# Patient Record
Sex: Female | Born: 1955 | Race: White | Hispanic: No | Marital: Married
Health system: Midwestern US, Academic
[De-identification: ages and names within clinical notes are randomized; demographics above are authoritative.]

---

## 2017-02-14 ENCOUNTER — Encounter: Admit: 2017-02-14 | Discharge: 2017-02-15

## 2017-02-14 DIAGNOSIS — R69 Illness, unspecified: Principal | ICD-10-CM

## 2017-02-28 ENCOUNTER — Encounter: Admit: 2017-02-28 | Discharge: 2017-02-28 | Payer: BC Managed Care – PPO

## 2017-02-28 NOTE — Telephone Encounter
Navigation Intake Assessment    Patient Name:  Vicki Buchanan  DOB: 11/17/1955  Insurance:  [employer]  Direct Referral: Yes  Appointment Info:  Scheduled 03/05/2017 @ 1:15 with Dr. 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      Physician Info:  ? Referring Physician:  Dr. Jonah Blue  ??? Contact Name & Number:  Brayton Caves @ [phone]  ??? Surgeon:  Dr. Jonah Blue  ??? PCP:  Dr. Fredia Sorrow    Location of Films:   [facility]    Location of Pathology:  Requested 02/28/2017 from [facility], and Patient notified outside pathology slides will be obtained for review by [facility] pathologist and a facility and professional fee will be billed to their insurance.      History of Present Illness: Patient seen by PCP on 02/14/2017 with c/o intermittent, recurrent abdominal cramping, bloating and diarrhea. Patient reports 6-7 months ago she began having episodes of abdominal cramping that progressed in severity until it caused her to vomit multiple times until it was dry heaving. This was followed by diarrhea the next morning. Patient continues to have abdominal cramping and bloating with alternating watery diarrhea and constipation (this can change day to day). Upon examination was noted decreased bowel sounds, mass (multiple firm lesions noted on deep palpation to RLQ, mid upper quadrant, and LLQ, non tender to palpation). She is tender with firm mass noted RUQ, LUQ and LLQ. A CT scan was done on 02/14/2017 that demonstrated large volume of abdominopelvic ascites, omental caking, evidence of pelvic peritoneal  enhancing nodularity suspicious for peritoneal carcinomatosis, and hepatic tumor implantation. Findings are most suspicious  for underling ovarian or gastrointestinal neoplasm. She was then seen by a General Surgeon and on 02/19/2017 she underwent diagnostic laparoscopy with biopsies of right diaphragm, right liver, right fallopian tube and left pelvis. Pathology showed involvement by mucinous neoplasm with low-grade features in all four biopsy specimens. CA 125 on 02/27/2017 was 90.4 and CEA is 1621.0.      PMH: Abdominal pain.    Surgical Hx   Surgery/Year: Cholecystectomy 1999, C-section 1981.    Reproductive History  Menstrual Hx   LMP: 12 years ago   Having Periods:  No   Age at first period: 61    Pregnancy Hx   Number of pregnancies: 2   Number of live births: 2   Age of first live birth: 1   Did you breastfeed: No    If Yes, how long?    Oral Birth Control:  Yes   Years: 6 months   Infertility Medication:  No   Year/Med Name:     Menopausal Hx   Age of last period: 61 y/o   Hormone Replacement Therapy:  No   Years:         Health Maintenence  Last Pap: 02/21/2017  Abn History of Pap: Denes    Colonoscopy: Never    Mammogram: 10 years ago    Bone scan: Never    DPOA: Yes, Vicki Buchanan (spouse) [phone]    Living Will: No      NEEDS Assessment:     Genetic Counseling:  Assessment:  Genetic Assessment: Other (Comment)      Intervention:  Genetic Intervention: Provided information about available services     Nutrition:  Assessment:  Additional Nutrition Assessment: Other (Comment)    Intervention:  Nutrition Intervention: Provided information about available services    Social & Financial:  Assessment:  Social and Financial Assessment: No needs identified    Intervention:          Spiritual & Emotional:  Assessment:   Spiritual and Emotional Assessment: Other (Comment)    Intervention:  Spiritual and Emotional Intervention: Emotional Support provided    Physical:  Assessment:  Fall Risk: None identified    Intervention:       Communication:  Assessment:  Communication Barrier: No    Intervention:       Onc Fertility:   Assessment:  Onc Fertility Assessment: Not applicable    Intervention:       Additional Education: Additional Education Documented: Yes

## 2017-03-05 ENCOUNTER — Encounter: Admit: 2017-03-05 | Discharge: 2017-03-05

## 2017-03-05 ENCOUNTER — Encounter: Admit: 2017-03-05 | Discharge: 2017-03-05 | Payer: BC Managed Care – PPO

## 2017-03-05 DIAGNOSIS — C801 Malignant (primary) neoplasm, unspecified: Principal | ICD-10-CM

## 2017-03-05 NOTE — Progress Notes
Name: Vicki Buchanan          MRN: [id number]      DOB: Jul 23, 1955      AGE: 61 y.o.   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    No chief complaint on file.    Vicki Buchanan is a 61 y.o. female.     Cancer Staging  No matching staging information was found for the patient.    History of Present Illness   Mrs.  Buchanan is a 61 year old otherwise healthy female who was seen by her PCP on 02/14/2017 with intermittent but recurrent abdominal cramping, bloating and diarrhea. Patient reports 6-7 months ago she began having episodes of abdominal cramping that progressed in severity until it caused her to vomit multiple times until it was dry heaving. This was followed by diarrhea the next morning. Patient continued to have abdominal cramping and bloating with alternating watery diarrhea and constipation.  Upon examination she was noted decreased bowel sounds, mass (multiple firm lesions noted on deep palpation to RLQ, mid upper quadrant, and LLQ, non tender to palpation). She is tender with firm mass noted RUQ, LUQ and LLQ. A CT scan was done on 02/14/2017 that demonstrated large volume of abdominopelvic ascites, omental caking, evidence of pelvic peritoneal  enhancing nodularity suspicious for peritoneal carcinomatosis, and hepatic tumor implantation. Findings are most suspicious  for underling ovarian or gastrointestinal neoplasm.     She was then seen by a General Surgeon and on 02/19/2017 she underwent diagnostic laparoscopy with biopsies of right diaphragm, right liver, right fallopian tube and left pelvis. Pathology showed involvement by mucinous neoplasm with low-grade features in all four biopsy specimens. CA 125 on 02/27/2017 was 90.4 and CEA is 1621.0. Findings at the time of surgery on 02/19/17 by Dr. Jonah Blue: A total of 1.7 L of fluid was removed and the peritoneal cavity.  It was slightly turbid and hemorrhagic in color.  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  There was bulky disease in the pelvis, scattered non-bulky disease of the right hemidiaphragm and on the capsule liver.  Left hemidiaphragm appear to be spared.  On the parietal peritoneum it was started with areas suspicious for carcinomatosis.  The stomach was observed.  There were no evident masses that could be discerned externally.  There was bulky disease in the pelvis over the bladder and a large, what appeared to be, right tubo-ovarian mass with heavy disposition of tumor in that area.  She had also an area of the right fimbria of the fallopian tube with a large to positive tumor.  A portion of the right fallopian tube and fimbria were removed with ligature device.  Biopsies left pelvis were taken of the left pelvic sidewall.  Biopsies were also taken of the serosal surface of the liver and the right hemidiaphragm.     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 This was low grade mucinous neoplasm which was focally positive for CK7, and negative for PAX-8. Cytology from abdominal fluid was negative for malignancy.     In regards to symptoms she does have some intentional weight loss. She has been dieting since the beginning of 2018 and lost about 12 pounds the first  half of the year and then 8 pounds thereafter. She started having signs of intermittent abdominal cramping and diarrhea as early as October when she really started to feel something was wrong. She was intermittently bloated since that time. Her sister who is an Charity fundraiser noticed that she was getting ascites. She also had one remote episode of vertigo in February and has had a few episodes since that time. 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   ???  She is accompanied by her daughter [name]), son Rolm Gala), sister Andrey Campanile) and husband Katherina Right).   ???  Past Medical History: No major health issues  ???  Past Surgical History: Cholecystectomy 1999, C-section 1981.    Family History: Father diagnosed with vocal cord cancer and received XRT and bladder cancer (heavy smoker) and received bladder installation type treatment, paternal aunt with lung cancer (heavy smoker) and Maternal grandmother with breast cancer (mastectomy) and rectal cancer (resection). 2 sisters and 1 brother without cancer.     Social History: Never smoker, no excess alcohol, works full time at [employer], no occupational exposures, lives in [place] with her husband of 42 years.   ???  Reproductive History  Menstrual Hx               LMP: 12 years ago               Having Periods:  No               Age at first period: 61  ???  Pregnancy Hx               Number of pregnancies: 2               Number of live births: 2               Age of first live birth: 27               Did you breastfeed: No                            If Yes, how long?                Oral Birth Control:  Yes               Years: 6 months               Infertility Medication:  No               Year/Med Name:   ???  Menopausal Hx               Age of last period: 61 y/o               Hormone Replacement Therapy:  No                  Health Maintenence  Last Pap: 02/21/2017  Abn History of Pap: Denes  Colonoscopy: Never  Mammogram: 10 or more years ago       Review of Systems   Constitutional: Positive for fatigue and unexpected weight change (Somewhat intentional with low carb diet). Negative for activity change, appetite change, chills and fever.   HENT: Positive for postnasal drip. Negative for mouth sores.    Respiratory: Negative for cough and shortness of breath.    Cardiovascular: Negative for chest pain. Gastrointestinal:  Positive for abdominal distention, constipation, diarrhea, nausea, rectal pain and vomiting.   Genitourinary: Negative for difficulty urinating and hematuria.   Musculoskeletal: Negative for arthralgias, joint swelling and myalgias.   Skin: Negative for color change and rash.   Neurological: Negative for weakness and numbness.   Hematological: Negative for adenopathy. Does not bruise/bleed easily.   Psychiatric/Behavioral: Negative for agitation and confusion. The patient is not nervous/anxious.        Objective:         ??? acetaminophen (TYLENOL) 500 mg tablet Take 500 mg by mouth every 6 hours as needed for Pain. Max of 4,000 mg of acetaminophen in 24 hours.   ??? cetirizine (ZYRTEC) 10 mg tablet Take 10 mg by mouth every morning.     Vitals:    03/05/17 1327   BP: 138/87   Pulse: 104   Resp: 18   Temp: 36.4 ???C (97.6 ???F)   TempSrc: Oral   SpO2: 98%   Weight: 65.6 kg (144 lb 9.6 oz)   Height: 157 cm (61.81)     Body mass index is 26.61 kg/m???.     Pain Score: Zero       Pain Addressed:  N/A    Patient Evaluated for a Clinical Trial: Patient not eligible for a treatment trial (including not needing treatment, needs palliative care, in remission).     [place] Cooperative Oncology Group performance status is 0, Fully active, able to carry on all pre-disease performance without restriction.Marland Kitchen     Physical Exam   Constitutional: She is oriented to person, place, and time. She appears well-developed and well-nourished.   HENT:   Head: Normocephalic and atraumatic.   Mouth/Throat: Oropharynx is clear and moist.   Eyes: EOM are normal. Pupils are equal, round, and reactive to light. No scleral icterus.   Neck: Normal range of motion. No JVD present.   Cardiovascular: Normal rate and regular rhythm.   No murmur heard.  Pulmonary/Chest: Effort normal and breath sounds normal. No respiratory distress.   Abdominal: Soft. Bowel sounds are normal. She exhibits no distension. There is no tenderness. Thick omental nodularity and firmness throughout entire abdomen. Small amount of fluid although difficult to evaluate through caking.    Genitourinary:   Genitourinary Comments: Large amount of hard stool in the rectum all the way up to rectosigmoid junction. Firm tumor palpated in the cul de sac. Unable to palpate ovarian mass.    Musculoskeletal: Normal range of motion. She exhibits no edema.   Neurological: She is alert and oriented to person, place, and time. No cranial nerve deficit.   Skin: Skin is warm and dry. No rash noted.   Psychiatric: She has a normal mood and affect.        CT abdomen and pelvis with contrast on 02/06/2017    Impression: There is a large amount of abdominal pelvic ascites, omental caking, evidence of pelvic peritoneal enhancing nodularity suspicious for peritoneal carcinomatosis, and hepatic tumor implant.  Findings are most suspicious for underlying ovarian or gastrointestinal neoplasm.    CEA 02/27/17 1,621 (0.0-4.7)  CA125 02/27/17 90.34 ( 0.0 - 38.1)    Pathology collected on 02/19/2017    A.  Fibrous tissue, left pelvic wall biopsy: Involvement by mucinous neoplasm with low-grade features    B.  Fibrous tissue, right tubo-ovarian biopsy: Involvement by mucinous neoplasm with low-grade features    C.  Fibrous tissue, right hemidiaphragm biopsy: Involvement by mucinous neoplasm with low-grade features    D.  Lesional  tissue, liver biopsy: Involvement by mucinous neoplasm with low-grade features.  Immunohistochemical stains performed on Block B1 show the tumor cells are focally positive for Keast CK 7 and negative for PAX 8.  This staining pattern is nonspecific.     Assessment and Plan:  Mrs. Stupka is a 60 year old female who presents for initial consultation for recently diagnosed low grade mucinous neoplasm. We reviewed her clinical course, pathology, imaging and outside operative report. She has disease caking the omentum and studded throughout the peritoneum. Operative report notes a large right sided ovarian mass which was not palpated on exam today.     Based on pathology origin of neoplasm is still unclear however based on low grade and pattern of disease suspect GI origin. Therefore we have already requested that these be sent to [facility] for expedited review and this will guide treatment.     > Referral to Dr. Terence Lux given extent of disease and possible GI origin  > Colonoscopy (she has never had one previously)  > Bilateral screening Mammogram  > Pathology request to be reviewed at [facility]  > Start miralax for constipation     The patient and her family were given the opportunity to ask questions which we answered to their satisfaction.     Patient seen and examined with Dr. Sibyl Parr.     Dwaine Gale, DO      Spent 60 minutes with patient and family    Recommend, based upon disease type and grade, that we proceed with surgical resection as primary treatment. Chemotherapy will have a limited benefit at this time.    ATTESTATION    I personally performed the key portions of the E/M visit, discussed case with resident and concur with resident documentation of history, physical exam, assessment, and treatment plan unless otherwise noted.    Staff name:  Irine Seal, MD Date:  03/10/2017

## 2017-03-05 NOTE — Progress Notes
Disc of images including CT dated 11.28.18 received from [facility]. Disc of images delivered to radiology to be loaded into PACS by 12.18.18

## 2017-03-06 ENCOUNTER — Encounter: Admit: 2017-03-06 | Discharge: 2017-03-06 | Payer: BC Managed Care – PPO

## 2017-03-06 ENCOUNTER — Encounter: Admit: 2017-03-06 | Discharge: 2017-03-06

## 2017-03-06 DIAGNOSIS — R1909 Other intra-abdominal and pelvic swelling, mass and lump: Principal | ICD-10-CM

## 2017-03-06 NOTE — Telephone Encounter
Email sent to Dr Alvan Dame [facility] to obtain appt for tomorrow. Will continue to follow up.

## 2017-03-06 NOTE — Progress Notes
Date of Service: 03/07/2017      Subjective     Reason for Visit:  Heme/Onc Care      Vicki Buchanan is a 61 y.o. female.    Mrs.  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      She was seen by her PCP on 02/14/2017 with intermittent but recurrent abdominal cramping, bloating and diarrhea. Patient reports 6-7 months ago she began having episodes of abdominal cramping that progressed in severity until it caused her to vomit multiple times until it was dry heaving. This was followed by diarrhea the next morning. Patient continued to have abdominal cramping and bloating with alternating watery diarrhea and constipation.  Upon examination she was noted decreased bowel sounds, mass (multiple firm lesions noted on deep palpation to RLQ, mid upper quadrant, and LLQ, non tender to palpation). She is tender with firm mass noted RUQ, LUQ and LLQ. A CT scan was done on 02/14/2017 that demonstrated large volume of abdominopelvic ascites, omental caking, evidence of pelvic peritoneal  enhancing nodularity suspicious for peritoneal carcinomatosis, and hepatic tumor implantation. Findings are most suspicious  for underling ovarian or gastrointestinal neoplasm.     She was then seen by a General Surgeon and on 02/19/2017 she underwent diagnostic laparoscopy with biopsies of right diaphragm, right liver, right fallopian tube and left pelvis. Pathology showed involvement by mucinous neoplasm with low-grade features in all four biopsy specimens. CA 125 on 02/27/2017 was 90.4 and CEA is 1621.0.    Findings at the time of surgery on 02/19/17 by Dr. Jonah Blue: A total of 1.7 L of fluid was removed and the peritoneal cavity.  It was slightly turbid and hemorrhagic in color.  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  There was bulky disease in the pelvis, scattered non-bulky disease of the right hemidiaphragm and on the capsule liver.  Left hemidiaphragm appear to be spared.  On the parietal peritoneum it was started with areas suspicious for carcinomatosis.  The stomach was observed.  There were no evident masses that could be discerned externally.  There was bulky disease in the pelvis over the bladder and a large, what appeared to be, right tubo-ovarian mass with heavy disposition of tumor in that area.  She had also an area of the right fimbria of the fallopian tube with a large to positive tumor.  A portion of the right fallopian tube and fimbria were removed with ligature device.  Biopsies left pelvis were taken of the left pelvic sidewall.  Biopsies were also taken of the serosal surface of the liver and the right hemidiaphragm.     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 This was low grade mucinous neoplasm which was focally positive for CK7, and negative for PAX-8. Cytology from abdominal fluid was negative for malignancy.       She met with Dr. Loralyn Freshwater, gyn onc, and presents today for opinion from surgical oncology. She does have some intentional weight loss. She has been dieting since the beginning of 2018 and lost about 12 pounds the first half of the year and then 8 pounds thereafter. She started having signs of intermittent abdominal cramping and diarrhea as early as October 2018 when she really started to feel something was wrong. She was intermittently bloated since that time. Her sister who is an  RN noticed that she was getting ascites.        Review of Systems   Constitutional: Positive for fatigue and unexpected weight change (Somewhat intentional with low carb diet). Negative for activity change, appetite change, chills and fever.   HENT: Positive for postnasal drip. Negative for mouth sores.    Respiratory: Negative for cough and shortness of breath. Cardiovascular: Negative for chest pain.   Gastrointestinal: Positive for abdominal distention, constipation, diarrhea, nausea, rectal pain and vomiting.   Genitourinary: Negative for difficulty urinating and hematuria.   Musculoskeletal: Negative for arthralgias, joint swelling and myalgias.   Skin: Negative for color change and rash.   Neurological: Negative for weakness and numbness.   Hematological: Negative for adenopathy. Does not bruise/bleed easily.   Psychiatric/Behavioral: Negative for agitation and confusion. The patient is not nervous/anxious.      The remainder of the complete 12-point comprehensive ROS is otherwise entirely negative.    No past medical history on file.    Past Surgical History:   Procedure Laterality Date   ??? HX CHOLECYSTECTOMY         Family History   Problem Relation Age of Onset   ??? Diabetes Mother    ??? Cancer Father    ??? Diabetes Brother    ??? Cancer-Lung Paternal Aunt    ??? Cancer-Breast Maternal Grandmother    ??? Diabetes Paternal Grandmother        Social History     Socioeconomic History   ??? Marital status: Married     Spouse name: Not on file   ??? Number of children: Not on file   ??? Years of education: Not on file   ??? Highest education level: Not on file   Social Needs   ??? Financial resource strain: Not on file   ??? Food insecurity - worry: Not on file   ??? Food insecurity - inability: Not on file   ??? Transportation needs - medical: Not on file   ??? Transportation needs - non-medical: Not on file   Occupational History   ??? Not on file   Tobacco Use   ??? Smoking status: Never Smoker   ??? Smokeless tobacco: Never Used   Substance and Sexual Activity   ??? Alcohol use: No     Frequency: Never   ??? Drug use: No   ??? Sexual activity: Not on file   Other Topics Concern   ??? Not on file   Social History Narrative   ??? Not on file         Objective:  ??? acetaminophen (TYLENOL) 500 mg tablet Take 500 mg by mouth every 6 hours as needed for Pain. Max of 4,000 mg of acetaminophen in 24 hours. ??? cetirizine (ZYRTEC) 10 mg tablet Take 10 mg by mouth every morning.   ??? polyethylene glycol 3350 (MIRALAX) 17 g packet Take 17 g by mouth daily.       Vitals:    03/07/17 0813   BP: 125/80   Pulse: 94   Resp: 16   Temp: 37.1 ???C (98.7 ???F)   TempSrc: Oral   SpO2: 98%   Weight: 64.6 kg (142 lb 6.4 oz)   Height: 157 cm (61.81)       Body mass index is 26.21 kg/m???.    Pain Score: Zero       Pain Rating:          Pain Addressed:  N/A    Patient Evaluated for a Clinical Trial: No  treatment clinical trial available for this patient.    [place] Cooperative Oncology Group performance status is 0, Fully active, able to carry on all pre-disease performance without restriction.Marland Kitchen     Physical Exam   Constitutional: She is oriented to person, place, and time. She appears well-developed and well-nourished. No distress.   HENT:   Head: Normocephalic.   Mouth/Throat: Oropharynx is clear and moist. No oropharyngeal exudate.   Eyes: Conjunctivae are normal. Pupils are equal, round, and reactive to light. No scleral icterus.   Neck: Normal range of motion. Neck supple.   Cardiovascular: Normal rate and regular rhythm. Exam reveals no gallop and no friction rub.   No murmur heard.  Pulmonary/Chest: Effort normal and breath sounds normal. No respiratory distress. She has no wheezes. She has no rales.   Abdominal: Soft. Bowel sounds are normal. She exhibits no distension and no mass. There is no tenderness. No hernia.   Musculoskeletal: Normal range of motion. She exhibits no edema or tenderness.   Lymphadenopathy:     She has no cervical adenopathy.        Right: No inguinal and no supraclavicular adenopathy present.        Left: No inguinal and no supraclavicular adenopathy present.   Neurological: She is alert and oriented to person, place, and time.   Skin: Skin is warm and dry. No rash noted. She is not diaphoretic. No erythema. No pallor.   Psychiatric: She has a normal mood and affect. Her behavior is normal. Judgment and thought content normal.   Vitals reviewed.          Assessment and Plan:  Mrs.  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      She was seen by her PCP on 02/14/2017 with intermittent but recurrent abdominal cramping, bloating and diarrhea. Patient reports 6-7 months ago she began having episodes of abdominal cramping that progressed in severity until it caused her to vomit multiple times until it was dry heaving. This was followed by diarrhea the next morning. Patient continued to have abdominal cramping and bloating with alternating watery diarrhea and constipation.  Upon examination she was noted decreased bowel sounds, mass (multiple firm lesions noted on deep palpation to RLQ, mid upper quadrant, and LLQ, non tender to palpation). She is tender with firm mass noted RUQ, LUQ and LLQ. A CT scan was done on 02/14/2017 that demonstrated large volume of abdominopelvic ascites, omental caking, evidence of pelvic peritoneal  enhancing nodularity suspicious for peritoneal carcinomatosis, and hepatic tumor implantation. Findings are most suspicious  for underling ovarian or gastrointestinal neoplasm.     She was then seen by a General Surgeon and on 02/19/2017 she underwent diagnostic laparoscopy with biopsies of right diaphragm, right liver, right fallopian tube and left pelvis. Pathology showed involvement by mucinous neoplasm with low-grade features in all four biopsy specimens. CA 125 on 02/27/2017 was 90.4 and CEA is 1621.0.    Findings at the time of surgery on 02/19/17 by Dr. Jonah Blue: A total of 1.7 L of fluid was removed and the peritoneal cavity.  It was slightly turbid and hemorrhagic in color.  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  There was bulky disease in the pelvis, scattered non-bulky disease of the right hemidiaphragm and on the capsule liver.  Left hemidiaphragm appear to be spared.  On the parietal peritoneum it was started with areas suspicious for carcinomatosis.  The stomach was observed.  There were no evident masses that could be discerned externally.  There was bulky disease in the pelvis over the bladder and a large, what appeared to be, right tubo-ovarian mass with heavy disposition of tumor in that area.  She had also an area of the right fimbria of the fallopian tube with a large to positive tumor.  A portion of the right fallopian tube and fimbria were removed with ligature device.  Biopsies left pelvis were taken of the left pelvic sidewall.  Biopsies were also taken of the serosal surface of the liver and the right hemidiaphragm.     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 This was low grade mucinous neoplasm which was focally positive for CK7, and negative for PAX-8. Cytology from abdominal fluid was negative for malignancy.       - Colonoscopy has been requested by Dr. Esperanza Sheets office.  - CXR for preoperative evaluation  - We will plan for an exploratory laparotomy, possible bowel resection, possible ostomy, possible feeding tube, cytoreductive surgery and hyperthermic intraperitoneal chemotherapy (CRS and HIPEC) to be performed by Dr. Rainey Pines and TAH-BSO to be performed by Dr. Sibyl Parr.  - Pt will need to undergo preanesthesia evaluation prior to surgery.  - The procedure, risks, and benefits were discussed in detail with the patient. The pt was provided the opportunity to have all questions and concerns addressed to satisfaction and is in agreement in the proposed plan of care.  - RTC 2 weeks after hospital discharge  - Pt was evaluated and plan developed in conjunction with Dr. Loralie Champagne.    Sherril Croon, PA-C    ATTESTATION I personally performed the key portions of the E/M visit, discussed case with Physician Assistant and concur with documentation of history, physical exam, assessment, and treatment plan unless otherwise noted.     I had a long discussion with the patient about the natural history of pseudomyxoma peritonei and the data concerning the value of cytoreductive surgery and hyperthermic intraperitoneal chemotherapy (CRS and HIPEC) in this setting. The risks and benefits of surgery were discussed in detail with the patient.  I answered all questions to the patient's satisfaction.  A consent form was signed.    Staff name:  Loralie Champagne, MD Date:  03/12/2017

## 2017-03-06 NOTE — Telephone Encounter
Navigator received referral to schedule the patient with Dr. Alvan Dame. Patient scheduled for 03/07/17 at [place]. Discussed appointment details, patient verbalized understanding. Patient is prepared to go to PAT if needed, patient can be added on. The patient hand carried a CD of her images and Dr. Freda Munro gave them back to her. Navigator instructed the patient to give that CD to Dr. Alvan Dame to the images can be uploaded into PACS permanently. Pathology slides were already requested and accessioned by pathology this morning. Dr. Ernestina Patches CNC updated. Althia Forts, RN

## 2017-03-07 ENCOUNTER — Encounter: Admit: 2017-03-07 | Discharge: 2017-03-07 | Payer: BC Managed Care – PPO

## 2017-03-07 ENCOUNTER — Ambulatory Visit: Admit: 2017-03-07 | Discharge: 2017-03-08

## 2017-03-07 ENCOUNTER — Ambulatory Visit: Admit: 2017-03-07 | Discharge: 2017-03-07 | Payer: BC Managed Care – PPO

## 2017-03-07 DIAGNOSIS — R922 Inconclusive mammogram: ICD-10-CM

## 2017-03-07 DIAGNOSIS — N6310 Unspecified lump in the right breast, unspecified quadrant: ICD-10-CM

## 2017-03-07 DIAGNOSIS — R69 Illness, unspecified: Principal | ICD-10-CM

## 2017-03-07 DIAGNOSIS — Z1231 Encounter for screening mammogram for malignant neoplasm of breast: ICD-10-CM

## 2017-03-07 DIAGNOSIS — C786 Secondary malignant neoplasm of retroperitoneum and peritoneum: Secondary | ICD-10-CM

## 2017-03-07 DIAGNOSIS — C801 Malignant (primary) neoplasm, unspecified: Principal | ICD-10-CM

## 2017-03-07 DIAGNOSIS — C181 Malignant neoplasm of appendix: Principal | ICD-10-CM

## 2017-03-07 LAB — COMPREHENSIVE METABOLIC PANEL
Lab: 0.4 mg/dL (ref 0.3–1.2)
Lab: 0.7 mg/dL (ref 0.4–1.00)
Lab: 102 MMOL/L — ABNORMAL LOW (ref 98–110)
Lab: 103 mg/dL — ABNORMAL HIGH (ref 70–100)
Lab: 12 mg/dL — ABNORMAL LOW (ref 7–25)
Lab: 138 MMOL/L (ref 137–147)
Lab: 3.9 MMOL/L — ABNORMAL LOW (ref 3.5–5.1)
Lab: 66 U/L (ref 25–110)
Lab: 7.7 g/dL — ABNORMAL HIGH (ref 6.0–8.0)
Lab: 9.6 mg/dL — ABNORMAL HIGH (ref 8.5–10.6)

## 2017-03-07 LAB — CBC: Lab: 5.8 10*3/uL (ref 4.5–11.0)

## 2017-03-07 MED ORDER — METRONIDAZOLE 500 MG PO TAB
ORAL_TABLET | 0 refills | Status: AC
Start: 2017-03-07 — End: 2017-03-26

## 2017-03-07 MED ORDER — NEOMYCIN 500 MG PO TAB
ORAL_TABLET | 0 refills | Status: AC
Start: 2017-03-07 — End: 2017-03-26

## 2017-03-07 MED ORDER — PEG-ELECTROLYTE SOLN 420 GRAM PO SOLR
4 L | Freq: Once | ORAL | 0 refills | Status: AC
Start: 2017-03-07 — End: ?

## 2017-03-07 NOTE — Progress Notes
Pre-screened patient for endoscopy procedure.  Nulytely split-dose colonoscopy prep instructions emailed to patient and e-scrip sent to pharmacy.      [facility]    PATIENT EDUCATION ??? COLONOSCOPY     A colonoscopy is a scope examination of your colon or large intestine.  This is about 6 feet long in most adults.        ? A thin, flexible scope with a light and lens will be inserted through your rectum into your colon.  ? The doctor will guide the scope along your colon to the junction of the large and small intestines.  ? The test usually takes 30 minutes.    ? Unless the test is prescribed for other reasons, the main purpose of the colonoscopy is to search for polyps.                                           ? A Colonoscopy can be used to screen for cancer or precancerous polyps.  It can also be used to assess the large intestine for various other diseases.  ? If biopsies are obtained during the procedure, you will be notified of the results in approximately 14 days.   ? We will sedate you for the test by giving you medicines through an IV that will help you to relax and sleep.  We will keep you after the exam for approximately 30 minutes.  ? The doctor will speak to you before and after the procedure.  ? Unless you request otherwise, we will invite your driver to the recovery room to hear the doctor???s post-exam report.  ? The colon must be completely clean for the procedure to be both accurate and comprehensive. Please follow the preparation instructions carefully.        SPLIT DOSE NULYTELY/GOLYTELY PREP (Recommended)  PATIENT PREPARATION INSTRUCTIONS??? COLONOSCOPY    To reduce the risk of bleeding, please discuss with your prescribing physician how to stop blood thinners such as Aspirin, Ibuprofen, Aleve, Naproxen, Plavix, and Coumadin before the test  Lovenox injections may be taken as usual through the day before the test.  Do NOT give yourself a Lovenox injection the morning of the test. If any of these medicines have been ordered by your doctor, you MUST check with the prescribing doctor to make sure it is okay to stop them.    Discuss Diabetic medications with the prescribing doctor.  ? Please do not eat any foods that have nuts, seeds, or kernels for 7 days before your colonoscopy, as this may interfere with the quality of your test.  ? Stop iron pills 7 days prior to the test (you may continue to take Multivitamins that contain Iron).  ? Stop any bulk laxatives/fiber supplements such as Metamucil, Citrucel, Benefiber, etc. 7 days before the test.    ? You will need to purchase the following items (requires a prescription):                The day BEFORE the test: Sunday, December 30th, 2018  DO NOT EAT ANY SOLID OR CREAMED FOODS. No Purple or Red Drinks  You may drink all the clear liquids that you desire.               Timeline:  ? 03/18/2017: The day BEFORE your procedure:  Start a clear liquid diet. Drink a generous amount of  clear liquids throughout the day.  No solid food or alcohol.     ? 03/18/2017: At 11:00 a.m. (or sometime in the morning) the day BEFORE your procedure:  Fill the Nulytely/Golytely jug up to the ???fill line??? with water, then place jug in the Refrigerator.  ? At 5:00 p.m. on Sunday, 03/18/2017 (the day before your procedure):  Drink one 8-ounce glass of Nulytely/Golytely every 10 minutes until ??? of the gallon of fluid is fully consumed (approximately 3 liter). Place remaining Nulytely/Golytely in refrigerator.  ? At 05:00 AM at the latest on Monday, 03/19/2017 (5 hours before your scheduled procedure time):   Drink one 8-ounce glass of Nulytely/Golytely every 10 minutes until there is no fluid remaining. Continue to drink lots of clear fluids.    You may drink clear liquids up until 4 hours before your scheduled procedure time. After 06:00 AM you should have nothing by mouth.  ? If you have an early morning test, take only your essential morning medicines (heart, blood pressure, etc.) with a small sip of water at 07:00 AM.  Discuss management of diabetic medications with your prescribing physician.     On the day of the test, please report at your scheduled time (08:30 AM) to the Admissions area of the facility.  ? You will be sedated for the procedure, so a responsible adult must drive you home (no Benedetto Goad, taxis or buses are permitted).  We require that the driver stay during the time that you are here.  If you do not have a driver we will be unable to do the test.    ? You will be here for 3-4 hours from arrival time.  ? You will not be able to return to work the same day if you have received sedation.  ? Please bring a list of your current medicines and the dosages with you.    Appointment Date:  Monday, December 31st, 2018 at 10:00 AM with Dr. Eliott Nine  Appointment Arrival time: 08:30 AM in Admissions at [facility] located at [address], [place] [address]  If you are scheduled at the [facility] and have any questions or need to reschedule, please call [phone] then press option number 3.    If you have completed your prep and your stools aren???t yellow or clear (without debris) call [phone] then press option number 3 for [facility]/ [employer].

## 2017-03-07 NOTE — Pre-Anesthesia Patient Instructions
GENERAL INFORMATION    Before you come to the hospital  ??? Make arrangements for a responsible adult to drive you home and stay with you for 24 hours following surgery.  ??? Bath/Shower Instructions  ??? Take a bath or shower with antibacterial soap the night before or the morning of your procedure. Use clean towels.  ??? Take a bath or shower using the special soap given to you in PAC. Use half the bottle the night before, and the other half the morning of your procedure. Use clean towels with each bath or shower.  ??? Put on clean clothes after bath or shower.  Avoid using lotion and oils.  ??? If you are having surgery above the waist, wear a shirt that fastens up the front.  ??? Sleep on clean sheets if bath or shower is done the night before procedure.  ??? Leave money, credit cards, jewelry, and any other valuables at home. The [facility] is not responsible for the loss or breakage of personal items.  ??? Remove nail polish, makeup and all jewelry (including piercings) before coming to the hospital.  ??? The morning of your procedure:  ??? brush your teeth and tongue  ??? do not smoke  ??? do not shave the area where you will have surgery    What to bring to the hospital  ??? ID/ Insurance Card  ??? Medical Device card  ??? Official documents for legal guardianship   ??? Copy of your Living Will, Advanced Directives, and/or Durable Power of Attorney   ??? Small bag with a few personal belongings  ??? Cases for glasses/hearing aids/contact lens (bring solutions for contacts)  ??? Dress in clean, loose, comfortable clothing     Eating or drinking before surgery  ??? Do not eat or drink anything after 11:00 p.m. the day before your procedure (including gum, mints, candy, or chewing tobacco) OR follow the specific instructions you were given by your Surgeon.  ??? You may have WATER ONLY up to 2 hours before arriving at the hospital.  ??? Other instructions: ***     Other instructions  Notify your surgeon if: ??? there is a possibility that you are pregnant  ??? you become ill with a cough, fever, sore throat, nausea, vomiting or flu-like symptoms  ??? you have any open wounds/sores that are red, painful, draining, or are new since you last saw  the doctor  ??? you need to cancel your procedure  ??? You will receive a call with your surgery arrival time from between 2:30pm and 4:30pm the last business day before your procedure.  If you do not receive a call, please call [phone] before 4:30pm or [phone] after 4:30pm.    Notify us at [facility]: [phone]  ??? if you need to cancel your procedure  ??? if you are going to be late    Arrival at the hospital    [facility]  [address]  [place], [place] [address]    ? Park in the P5 parking garage located at [employer], [place], [place] [address].   ? Judee Clara parking is available in front of [employer] [facility] between the hours of 7:00 am and 4:00 pm Monday through Friday.  ? If parking in the P5 garage, take the east elevators in the parking garage to the second level and walk to the entrance of the [employer] [facility].    ? Enter through the 1st floor  main entrance and check in with Information Desk.   ? If you are a woman between the ages of 20 and 86, and have not had a hysterectomy, you will be asked for a urine sample prior to surgery.  Please do not urinate before arriving in the Surgery Waiting Room.  Once there, check in and let the attendant know if you need to provide a sample.

## 2017-03-07 NOTE — Telephone Encounter
Called GI department ro follow up on referral for colonoscopy.  Spoke to [employer] and she said that she will call the patient to get her scheduled for colonoscopy.

## 2017-03-07 NOTE — Telephone Encounter
Pt returned call. Informed her of normal CXR. Pt voiced understanding.

## 2017-03-09 ENCOUNTER — Encounter: Admit: 2017-03-09 | Discharge: 2017-03-09 | Payer: BC Managed Care – PPO

## 2017-03-09 NOTE — Telephone Encounter
Called pt to confirm surgery date of 04/03/17. Pt voiced understanding and had no further questions.

## 2017-03-09 NOTE — Telephone Encounter
Called and left VM for pt on home and cell phones requesting call back to discuss surgery date. Call back information provided.

## 2017-03-12 ENCOUNTER — Encounter: Admit: 2017-03-12 | Discharge: 2017-03-12 | Payer: BC Managed Care – PPO

## 2017-03-12 DIAGNOSIS — C181 Malignant neoplasm of appendix: Principal | ICD-10-CM

## 2017-03-14 ENCOUNTER — Encounter: Admit: 2017-03-14 | Discharge: 2017-03-14 | Payer: BC Managed Care – PPO

## 2017-03-14 DIAGNOSIS — C482 Malignant neoplasm of peritoneum, unspecified: Principal | ICD-10-CM

## 2017-03-14 NOTE — Telephone Encounter
Per Dr Freda Munro note.  Right Breast US ordered and scheduled for 03/21/2016 at [facility].  Patient has been notified and verbalized understanding.

## 2017-03-15 NOTE — Progress Notes
Called pt today to see if she would be willing to change her colonoscopy from 1000 with Dr. Ebony Hail to 0900 with Dr. Renea Ee on the same day, December 31. Pt agreed to the change. Requested a new e-mail with the new time. E-mail sent as requested.

## 2017-03-19 ENCOUNTER — Encounter: Admit: 2017-03-19 | Discharge: 2017-03-19 | Payer: BC Managed Care – PPO

## 2017-03-19 ENCOUNTER — Ambulatory Visit: Admit: 2017-03-19 | Discharge: 2017-03-19 | Payer: BC Managed Care – PPO

## 2017-03-19 DIAGNOSIS — C786 Secondary malignant neoplasm of retroperitoneum and peritoneum: ICD-10-CM

## 2017-03-19 DIAGNOSIS — K644 Residual hemorrhoidal skin tags: ICD-10-CM

## 2017-03-19 DIAGNOSIS — C801 Malignant (primary) neoplasm, unspecified: ICD-10-CM

## 2017-03-19 DIAGNOSIS — C181 Malignant neoplasm of appendix: ICD-10-CM

## 2017-03-19 DIAGNOSIS — Z1211 Encounter for screening for malignant neoplasm of colon: Principal | ICD-10-CM

## 2017-03-19 DIAGNOSIS — K623 Rectal prolapse: ICD-10-CM

## 2017-03-19 MED ORDER — LACTATED RINGERS IV SOLP
Freq: Once | INTRAVENOUS | 0 refills | Status: CP
Start: 2017-03-19 — End: ?
  Administered 2017-03-19: 15:00:00 1000.000 mL via INTRAVENOUS

## 2017-03-19 MED ORDER — LIDOCAINE (PF) 200 MG/10 ML (2 %) IJ SYRG
0 refills | Status: DC
Start: 2017-03-19 — End: 2017-03-19
  Administered 2017-03-19: 16:00:00 40 mg via INTRAVENOUS

## 2017-03-19 MED ORDER — SODIUM CHLORIDE 0.9 % IV SOLP
0 refills | Status: DC
Start: 2017-03-19 — End: 2017-03-19
  Administered 2017-03-19: 16:00:00 via INTRAVENOUS

## 2017-03-19 MED ORDER — PROPOFOL 10 MG/ML IV EMUL 20 ML (INFUSION)(AM)(OR)
INTRAVENOUS | 0 refills | Status: DC
Start: 2017-03-19 — End: 2017-03-19
  Administered 2017-03-19: 16:00:00 120 ug/kg/min via INTRAVENOUS

## 2017-03-19 NOTE — Anesthesia Pre-Procedure Evaluation
Anesthesia Pre-Procedure Evaluation    Name: Vicki Buchanan      MRN: [id number]     DOB: May 23, 1955     Age: 61 y.o.     Sex: female   __________________________________________________________________________     Procedure Date: 03/19/2017   Procedure: Procedure(s):  COLONOSCOPY DIAGNOSTIC WITH SPECIMEN COLLECTION BY BRUSHING/ WASHING - FLEXIBLE     Physical Assessment  Vital Signs (last filed in past 24 hours):  BP: 123/80 (12/31 0825)  Temp: 37 ???C (98.6 ???F) (12/31 0825)  Pulse: 94 (12/31 0825)  Respirations: 23 PER MINUTE (12/31 0825)  O2 Delivery: None (Room Air) (12/31 0825)  Height: 160 cm (63) (12/31 0825)  Weight: 65.8 kg (145 lb) (12/31 0825)      Patient History  Allergies   Allergen Reactions   ??? Shrimp SEE COMMENTS     Eye irritation. Namely blood vessel issues in the eyes         Current Medications    Medication Directions   acetaminophen (TYLENOL) 500 mg tablet Take 1,000 mg by mouth twice daily as needed for Pain. Max of 4,000 mg of acetaminophen in 24 hours.    cetirizine (ZYRTEC) 10 mg tablet Take 10 mg by mouth every morning.   metroNIDAZOLE (FLAGYL) 500 mg tablet Take two tablets by mouth at 1pm. Take two tablets by mouth at 3pm. Take two tablets by mouth at 11pm.   neomycin 500 mg tablet Take two tablets by mouth at 1pm. Take two tablets by mouth at 3pm. Take two tablets by mouth at 11pm.   polyethylene glycol 3350 (MIRALAX) 17 g packet Take 17 g by mouth daily.   ranitidine(+) (ZANTAC) 150 mg tablet Take 150 mg by mouth daily as needed for Heartburn.     Past Medical History:   Diagnosis Date   ??? Cancer (HCC)    ??? 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 CESAREAN SECTION  1981   ??? HX CHOLECYSTECTOMY  1995   ??? LAPAROSCOPY  02/2017    biopsy peritoneal     Social History     Socioeconomic History   ??? Marital status: Married     Spouse name: Not on file   ??? Number of children: Not on file   ??? Years of education: Not on file ??? Highest education level: Not on file   Social Needs   ??? Financial resource strain: Not on file   ??? Food insecurity - worry: Not on file   ??? Food insecurity - inability: Not on file   ??? Transportation needs - medical: Not on file   ??? Transportation needs - non-medical: Not on file   Occupational History   ??? Not on file   Tobacco Use   ??? Smoking status: Never Smoker   ??? Smokeless tobacco: Never Used   Substance and Sexual Activity   ??? Alcohol use: No     Frequency: Never     Comment: rarely   ??? Drug use: No   ??? Sexual activity: Not on file   Other Topics Concern   ??? Not on file   Social History Narrative   ??? Not on file         Review of Systems/Medical History      Patient summary reviewed  Nursing notes reviewed  Pertinent labs reviewed    PONV Screening: Female gender and Non-smoker  No history of anesthetic complications  No  family history of anesthetic complications      Airway - negative        Pulmonary - negative      Not a current smoker        Cardiovascular - negative        Exercise tolerance: >4 METS (able to walk 6 blocks, ascend 2 flights of stairs without CP or dyspnea)      Beta Blocker therapy: No      No hypertension,       No valvular problems/murmurs      No past MI,       No palpitations      No dysrhythmias      No angina      No hyperlipidemia      GI/Hepatic/Renal           GERD (diet-controlled), well controlled      Bowel prep      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, mucinous adenocarcinoma of unknown primary origin    1.7L abdominal fluid drained at time of biopsy 02/19/17      Neuro/Psych         Positional vertigo with prn dramamine      Musculoskeletal - negative        Endocrine/Other         Malignancy (appendix CA)   Physical Exam    Airway Findings      Mallampati: II      TM distance: >3 FB      Neck ROM: full      Mouth opening: good      Airway patency: adequate    Dental Findings: Negative Cardiovascular Findings:       Rhythm: regular      Rate: normal      No murmur, no carotid bruit, no peripheral edema    Pulmonary Findings: Negative      Abdominal Findings:       Not obese    Neurological Findings:       Normal mental status       Diagnostic Tests  Hematology:   Lab Results   Component Value Date    HGB 10.3 03/07/2017    HCT 32.1 03/07/2017    PLTCT 448 03/07/2017    WBC 5.8 03/07/2017    MCV 75.6 03/07/2017    MCH 24.4 03/07/2017    MCHC 32.2 03/07/2017    MPV 7.0 03/07/2017    RDW 16.8 03/07/2017         General Chemistry:   Lab Results   Component Value Date    NA 138 03/07/2017    K 3.9 03/07/2017    CL 102 03/07/2017    CO2 26 03/07/2017    GAP 10 03/07/2017    BUN 12 03/07/2017    CR 0.78 03/07/2017    GLU 103 03/07/2017    CA 9.6 03/07/2017    ALBUMIN 4.0 03/07/2017    TOTBILI 0.4 03/07/2017      Coagulation: No results found for: PT, PTT, INR      Anesthesia Plan    ASA score: 3   Plan: MAC  Induction method: intravenous  NPO status: acceptable      Informed Consent  Anesthetic plan and risks discussed with patient.  Use of blood products discussed with patient  Blood Consent: consented      Plan discussed with: anesthesiologist and CRNA.

## 2017-03-19 NOTE — Discharge Instructions
Colon/Lower EUS/Retrograde Enteroscopy     -If you feel feverish, have a temperature of 101 degrees or higher, persistent nausea and vomiting, abdominal pain or dark stools; please notify your nurse or GI physician.    -You may have abdominal cramping following the procedure this can be relieved by belching or passing air.    -If you have redness or swelling at the IV site, place a warm, wet washcloth over the affected areas for 15 minutes, 3-4 times a day until the redness subsides.  If symptoms continue for 2-3 days, contact your regular physician.    - If you have bleeding from your bowels over 2 tablespoons and increasing, please notify your physician.  A small amount of bleeding is normal if a biopsy or polyps were taken.    - You may resume all your routine medications, if medications need to be held your physician and/or nurse will notify you post procedure.    SPECIFIC INSTRUCTIONS  OUTPATIENTS:  Because of sedation and lack of coordination, UNTIL TOMORROW, DO NOT:  1. Operate any motorized vehicle - this includes driving.  2. Sign any legal documents or conduct important business matters.  3. Use any dangerous machinery (chain saw, lawnmower, etc.).  4. Drink any alcoholic beverages.  Should you have any questions or concerns after your procedure please call [phone] M-F 8am-5:00 pm. After 5:00 pm, holidays or weekends call [phone] and ask for the GI Doctor on call.

## 2017-03-19 NOTE — Anesthesia Post-Procedure Evaluation
Post-Anesthesia Evaluation    Name: Vicki Buchanan      MRN: [id number]     DOB: December 13, 1955     Age: 61 y.o.     Sex: female   __________________________________________________________________________     Procedure Date: 03/19/2017  Procedure: Procedure(s):  COLONOSCOPY DIAGNOSTIC WITH SPECIMEN COLLECTION BY BRUSHING/ WASHING - FLEXIBLE  COLONOSCOPY WITH BIOPSY - FLEXIBLE      Surgeon: Surgeon(s):  Kerman Passey, MD  Bertram Gala, Suzy Bouchard, MD    Post-Anesthesia Vitals  BP: 109/80 (12/31 1030)  Pulse: 86 (12/31 1030)  Respirations: 18 PER MINUTE (12/31 1030)  SpO2: 96 % (12/31 1030)  SpO2 Pulse: 85 (12/31 1030)      Post Anesthesia Evaluation Note    Evaluation location: Pre/Post  Patient participation: recovered; patient participated in evaluation  Level of consciousness: alert    Pain score: 0  Pain management: adequate    Hydration: normovolemia  Temperature: 36.0C - 38.4C  Airway patency: adequate    Perioperative Events  Perioperative events:  no       Post-op nausea and vomiting: no PONV    Postoperative Status  Cardiovascular status: hemodynamically stable  Respiratory status: spontaneous ventilation  Follow-up needed: none (Wide QRS complex noted in V1 lead on tele, existing preop as well. ECG post procedure with RBBB, no further follow up at this point.)        Perioperative Events  Perioperative Event: No  Emergency Case Activation: No

## 2017-03-20 ENCOUNTER — Encounter: Admit: 2017-03-20 | Discharge: 2017-03-20 | Payer: BC Managed Care – PPO

## 2017-03-20 DIAGNOSIS — C801 Malignant (primary) neoplasm, unspecified: ICD-10-CM

## 2017-03-20 DIAGNOSIS — C181 Malignant neoplasm of appendix: Principal | ICD-10-CM

## 2017-03-21 ENCOUNTER — Encounter: Admit: 2017-03-21 | Discharge: 2017-03-21 | Payer: BC Managed Care – PPO

## 2017-03-21 ENCOUNTER — Ambulatory Visit: Admit: 2017-03-21 | Discharge: 2017-03-21

## 2017-03-21 DIAGNOSIS — C482 Malignant neoplasm of peritoneum, unspecified: Principal | ICD-10-CM

## 2017-03-21 DIAGNOSIS — Z1231 Encounter for screening mammogram for malignant neoplasm of breast: Principal | ICD-10-CM

## 2017-03-23 ENCOUNTER — Encounter: Admit: 2017-03-23 | Discharge: 2017-03-23 | Payer: BC Managed Care – PPO

## 2017-03-23 ENCOUNTER — Emergency Department: Admit: 2017-03-23 | Discharge: 2017-03-23 | Payer: BC Managed Care – PPO

## 2017-03-23 ENCOUNTER — Inpatient Hospital Stay
Admit: 2017-03-23 | Discharge: 2017-03-26 | Disposition: A | Discharge status: Home / Self Care | Payer: BC Managed Care – PPO

## 2017-03-23 DIAGNOSIS — C801 Malignant (primary) neoplasm, unspecified: ICD-10-CM

## 2017-03-23 DIAGNOSIS — C762 Malignant neoplasm of abdomen: ICD-10-CM

## 2017-03-23 DIAGNOSIS — C786 Secondary malignant neoplasm of retroperitoneum and peritoneum: Secondary | ICD-10-CM

## 2017-03-23 DIAGNOSIS — C181 Malignant neoplasm of appendix: Principal | ICD-10-CM

## 2017-03-23 LAB — COMPREHENSIVE METABOLIC PANEL
Lab: 0.7 mg/dL (ref 0.4–1.00)
Lab: 101 MMOL/L — ABNORMAL LOW (ref 98–110)
Lab: 113 mg/dL — ABNORMAL HIGH (ref 70–100)
Lab: 13 mg/dL — ABNORMAL LOW (ref 7–25)
Lab: 140 MMOL/L (ref 137–147)

## 2017-03-23 LAB — CBC AND DIFF: Lab: 9.1 10*3/uL (ref 4.5–11.0)

## 2017-03-23 LAB — LIPASE: Lab: 9 U/L — ABNORMAL LOW (ref 11–82)

## 2017-03-23 LAB — POC CREATININE, RAD: Lab: 0.7 mg/dL (ref 0.4–1.00)

## 2017-03-23 MED ORDER — IOHEXOL 350 MG IODINE/ML IV SOLN
85 mL | Freq: Once | INTRAVENOUS | 0 refills | Status: CP
Start: 2017-03-23 — End: ?
  Administered 2017-03-23: 21:00:00 85 mL via INTRAVENOUS

## 2017-03-23 MED ORDER — FENTANYL CITRATE (PF) 50 MCG/ML IJ SOLN
25-50 ug | INTRAVENOUS | 0 refills | Status: DC | PRN
Start: 2017-03-23 — End: 2017-03-26
  Administered 2017-03-23 – 2017-03-24 (×3): 50 ug via INTRAVENOUS

## 2017-03-23 MED ORDER — FENTANYL CITRATE (PF) 50 MCG/ML IJ SOLN
50 ug | Freq: Once | INTRAVENOUS | 0 refills | Status: CP
Start: 2017-03-23 — End: ?
  Administered 2017-03-23: 22:00:00 50 ug via INTRAVENOUS

## 2017-03-23 MED ORDER — LIDOCAINE HCL 2 % MM JELP
Freq: Once | TOPICAL | 0 refills | Status: CP
Start: 2017-03-23 — End: ?
  Administered 2017-03-23: 22:00:00 20.000 mL via TOPICAL

## 2017-03-23 MED ORDER — ENOXAPARIN 40 MG/0.4 ML SC SYRG
40 mg | Freq: Every day | SUBCUTANEOUS | 0 refills | Status: DC
Start: 2017-03-23 — End: 2017-03-26
  Administered 2017-03-24 – 2017-03-26 (×3): 40 mg via SUBCUTANEOUS

## 2017-03-23 MED ORDER — ONDANSETRON HCL (PF) 4 MG/2 ML IJ SOLN
4 mg | Freq: Once | INTRAVENOUS | 0 refills | Status: CP
Start: 2017-03-23 — End: ?
  Administered 2017-03-23: 20:00:00 4 mg via INTRAVENOUS

## 2017-03-23 MED ORDER — LACTATED RINGERS IV SOLP
INTRAVENOUS | 0 refills | Status: DC
Start: 2017-03-23 — End: 2017-03-23

## 2017-03-23 MED ORDER — ONDANSETRON HCL (PF) 4 MG/2 ML IJ SOLN
4 mg | Freq: Once | INTRAVENOUS | 0 refills | Status: CP
Start: 2017-03-23 — End: ?

## 2017-03-23 MED ORDER — ONDANSETRON HCL (PF) 4 MG/2 ML IJ SOLN
4-8 mg | INTRAVENOUS | 0 refills | Status: DC | PRN
Start: 2017-03-23 — End: 2017-03-26
  Administered 2017-03-23: 4 mg via INTRAVENOUS

## 2017-03-23 MED ORDER — DEXTROSE 5%-0.45% SODIUM CHLORIDE & POTASSIUM CHLORIDE 20 MEQ/L IV SOLP
INTRAVENOUS | 0 refills | Status: DC
Start: 2017-03-23 — End: 2017-03-26
  Administered 2017-03-23 – 2017-03-26 (×7): 1000.000 mL via INTRAVENOUS

## 2017-03-23 MED ORDER — SODIUM CHLORIDE 0.9 % IV SOLP
1000 mL | Freq: Once | INTRAVENOUS | 0 refills | Status: CP
Start: 2017-03-23 — End: ?
  Administered 2017-03-23: 20:00:00 1000 mL via INTRAVENOUS

## 2017-03-23 MED ORDER — PANTOPRAZOLE 40 MG IV SOLR
40 mg | Freq: Every day | INTRAVENOUS | 0 refills | Status: DC
Start: 2017-03-23 — End: 2017-03-26
  Administered 2017-03-23 – 2017-03-26 (×4): 40 mg via INTRAVENOUS

## 2017-03-23 MED ORDER — SODIUM CHLORIDE 0.9 % IJ SOLN
50 mL | Freq: Once | INTRAVENOUS | 0 refills | Status: CP
Start: 2017-03-23 — End: ?
  Administered 2017-03-23: 21:00:00 50 mL via INTRAVENOUS

## 2017-03-23 MED ORDER — FAMOTIDINE (PF) 20 MG/2 ML IV SOLN
20 mg | Freq: Once | INTRAVENOUS | 0 refills | Status: CP
Start: 2017-03-23 — End: ?
  Administered 2017-03-23: 21:00:00 20 mg via INTRAVENOUS

## 2017-03-23 MED ORDER — FENTANYL CITRATE (PF) 50 MCG/ML IJ SOLN
50 ug | Freq: Once | INTRAVENOUS | 0 refills | Status: CP
Start: 2017-03-23 — End: ?
  Administered 2017-03-23: 20:00:00 50 ug via INTRAVENOUS

## 2017-03-23 MED ADMIN — FENTANYL CITRATE (PF) 50 MCG/ML IJ SOLN [3037]: 25 ug | INTRAVENOUS | @ 23:00:00 | Stop: 2017-03-23 | NDC 00409909412

## 2017-03-23 MED ADMIN — ONDANSETRON HCL (PF) 4 MG/2 ML IJ SOLN [136012]: 4 mg | INTRAVENOUS | @ 22:00:00 | Stop: 2017-03-23 | NDC 00641607801

## 2017-03-24 LAB — PHOSPHORUS: Lab: 3.3 mg/dL — ABNORMAL LOW (ref 2.0–4.5)

## 2017-03-24 LAB — BASIC METABOLIC PANEL: Lab: 139 MMOL/L — ABNORMAL LOW (ref >60)

## 2017-03-24 LAB — MAGNESIUM: Lab: 2 mg/dL — ABNORMAL LOW (ref >60)

## 2017-03-24 LAB — CBC: Lab: 7.8 10*3/uL — ABNORMAL LOW (ref 4.5–11.0)

## 2017-03-24 MED ORDER — ACETAMINOPHEN 1,000 MG/100 ML (10 MG/ML) IV SOLN
1000 mg | Freq: Once | INTRAVENOUS | 0 refills | Status: CP
Start: 2017-03-24 — End: ?
  Administered 2017-03-25: 05:00:00 1000 mg via INTRAVENOUS

## 2017-03-24 MED ORDER — PHENOL 1.4 % MM SPRA
2 | OROMUCOSAL | 0 refills | Status: DC | PRN
Start: 2017-03-24 — End: 2017-03-26
  Administered 2017-03-24: 20:00:00 2 via OROMUCOSAL

## 2017-03-25 ENCOUNTER — Inpatient Hospital Stay: Admit: 2017-03-25 | Discharge: 2017-03-25 | Payer: BC Managed Care – PPO

## 2017-03-25 DIAGNOSIS — R112 Nausea with vomiting, unspecified: ICD-10-CM

## 2017-03-25 LAB — PHOSPHORUS: Lab: 2.7 mg/dL — ABNORMAL HIGH (ref >60)

## 2017-03-25 LAB — BASIC METABOLIC PANEL: Lab: 139 MMOL/L — ABNORMAL LOW (ref >60)

## 2017-03-25 LAB — MAGNESIUM: Lab: 2 mg/dL — ABNORMAL LOW (ref >60)

## 2017-03-25 LAB — CBC: Lab: 5.6 K/UL — ABNORMAL LOW (ref 4.5–11.0)

## 2017-03-25 MED ORDER — DIATRIZOATE MEG-DIATRIZOAT SOD 66-10 % PO SOLN
180 mL | Freq: Once | NASOGASTRIC | 0 refills | Status: CP
Start: 2017-03-25 — End: ?
  Administered 2017-03-25: 16:00:00 180 mL via NASOGASTRIC

## 2017-03-26 DIAGNOSIS — C801 Malignant (primary) neoplasm, unspecified: ICD-10-CM

## 2017-03-26 DIAGNOSIS — K56609 Unspecified intestinal obstruction, unspecified as to partial versus complete obstruction: Principal | ICD-10-CM

## 2017-03-26 DIAGNOSIS — C786 Secondary malignant neoplasm of retroperitoneum and peritoneum: ICD-10-CM

## 2017-03-26 DIAGNOSIS — C785 Secondary malignant neoplasm of large intestine and rectum: ICD-10-CM

## 2017-03-26 LAB — COMPREHENSIVE METABOLIC PANEL
Lab: 106 MMOL/L — ABNORMAL HIGH (ref 98–110)
Lab: 137 MMOL/L — ABNORMAL HIGH (ref >60)

## 2017-03-26 LAB — CBC: Lab: 5.4 K/UL — ABNORMAL LOW (ref 4.5–11.0)

## 2017-03-26 LAB — MAGNESIUM: Lab: 2 mg/dL — ABNORMAL LOW (ref 1.6–2.6)

## 2017-03-26 LAB — PHOSPHORUS: Lab: 2.8 mg/dL — ABNORMAL LOW (ref >60)

## 2017-03-26 LAB — PREALBUMIN: Lab: 15 mg/dL — ABNORMAL LOW (ref >60)

## 2017-03-26 MED ORDER — POLYETHYLENE GLYCOL 3350 17 GRAM PO PWPK
1 | Freq: Two times a day (BID) | ORAL | 0 refills | Status: DC
Start: 2017-03-26 — End: 2017-03-26

## 2017-04-02 ENCOUNTER — Inpatient Hospital Stay: Admit: 2017-04-02 | Discharge: 2017-04-02 | Payer: BC Managed Care – PPO

## 2017-04-03 ENCOUNTER — Encounter: Admit: 2017-04-03 | Discharge: 2017-04-03 | Payer: BC Managed Care – PPO

## 2017-04-03 ENCOUNTER — Inpatient Hospital Stay
Admit: 2017-04-03 | Discharge: 2017-04-06 | Disposition: A | Discharge status: Home / Self Care | Payer: BC Managed Care – PPO

## 2017-04-03 ENCOUNTER — Inpatient Hospital Stay: Admit: 2017-04-03 | Discharge: 2017-04-03 | Payer: BC Managed Care – PPO

## 2017-04-03 DIAGNOSIS — C801 Malignant (primary) neoplasm, unspecified: ICD-10-CM

## 2017-04-03 DIAGNOSIS — C181 Malignant neoplasm of appendix: Principal | ICD-10-CM

## 2017-04-03 MED ORDER — PROPOFOL INJ 10 MG/ML IV VIAL
0 refills | Status: DC
Start: 2017-04-03 — End: 2017-04-03
  Administered 2017-04-03: 14:00:00 110 mg via INTRAVENOUS

## 2017-04-03 MED ORDER — DEXAMETHASONE SODIUM PHOSPHATE 4 MG/ML IJ SOLN
INTRAVENOUS | 0 refills | Status: DC
Start: 2017-04-03 — End: 2017-04-03
  Administered 2017-04-03: 14:00:00 4 mg via INTRAVENOUS

## 2017-04-03 MED ORDER — PHENOL 1.4 % MM SPRA
2 | OROMUCOSAL | 0 refills | Status: DC | PRN
Start: 2017-04-03 — End: 2017-04-06

## 2017-04-03 MED ORDER — LIDOCAINE (PF) 200 MG/10 ML (2 %) IJ SYRG
0 refills | Status: DC
Start: 2017-04-03 — End: 2017-04-03
  Administered 2017-04-03: 14:00:00 90 mg via INTRAVENOUS

## 2017-04-03 MED ORDER — DIPHENHYDRAMINE HCL 50 MG/ML IJ SOLN
25 mg | Freq: Once | INTRAVENOUS | 0 refills | Status: DC | PRN
Start: 2017-04-03 — End: 2017-04-03

## 2017-04-03 MED ORDER — ELECTROLYTE-A IV SOLP
0 refills | Status: DC
Start: 2017-04-03 — End: 2017-04-03
  Administered 2017-04-03 (×2): via INTRAVENOUS

## 2017-04-03 MED ORDER — LIDOCAINE (PF) 10 MG/ML (1 %) IJ SOLN
.1-2 mL | INTRAMUSCULAR | 0 refills | Status: DC | PRN
Start: 2017-04-03 — End: 2017-04-03

## 2017-04-03 MED ORDER — LACTATED RINGERS IV SOLP
1000 mL | INTRAVENOUS | 0 refills | Status: DC
Start: 2017-04-03 — End: 2017-04-03
  Administered 2017-04-03: 14:00:00 1000.000 mL via INTRAVENOUS

## 2017-04-03 MED ORDER — LIDOCAINE (PF) 10 MG/ML (1 %) IJ SOLN (OR)
0 refills | Status: DC
Start: 2017-04-03 — End: 2017-04-03

## 2017-04-03 MED ORDER — LACTATED RINGERS IV SOLP
INTRAVENOUS | 0 refills | Status: DC
Start: 2017-04-03 — End: 2017-04-04
  Administered 2017-04-03 – 2017-04-04 (×2): 1000.000 mL via INTRAVENOUS

## 2017-04-03 MED ORDER — FENTANYL CITRATE (PF) 50 MCG/ML IJ SOLN
50 ug | INTRAVENOUS | 0 refills | Status: DC | PRN
Start: 2017-04-03 — End: 2017-04-03
  Administered 2017-04-03: 17:00:00 50 ug via INTRAVENOUS

## 2017-04-03 MED ORDER — ROCURONIUM 10 MG/ML IV SOLN
INTRAVENOUS | 0 refills | Status: DC
Start: 2017-04-03 — End: 2017-04-03
  Administered 2017-04-03: 15:00:00 30 mg via INTRAVENOUS
  Administered 2017-04-03: 14:00:00 50 mg via INTRAVENOUS

## 2017-04-03 MED ORDER — LIDOCAINE-EPINEPHRINE (PF) 1.5 %-1:200,000 IJ SOLN (OR)
0 refills | Status: DC
Start: 2017-04-03 — End: 2017-04-03

## 2017-04-03 MED ORDER — CALCIUM CARBONATE 200 MG CALCIUM (500 MG) PO CHEW
500 mg | ORAL | 0 refills | Status: DC | PRN
Start: 2017-04-03 — End: 2017-04-06
  Administered 2017-04-04: 04:00:00 500 mg via ORAL

## 2017-04-03 MED ORDER — SODIUM CHLORIDE 0.9 % IV SOLP
500 mL | INTRAVENOUS | 0 refills | Status: DC
Start: 2017-04-03 — End: 2017-04-04

## 2017-04-03 MED ORDER — CEFOXITIN INJ 2GM IVP
2 g | INTRAVENOUS | 0 refills | Status: CP
Start: 2017-04-03 — End: ?
  Administered 2017-04-03 – 2017-04-04 (×3): 2 g via INTRAVENOUS

## 2017-04-03 MED ORDER — FENTANYL CITRATE (PF) 50 MCG/ML IJ SOLN
25-50 ug | INTRAVENOUS | 0 refills | Status: DC | PRN
Start: 2017-04-03 — End: 2017-04-06
  Administered 2017-04-03 – 2017-04-05 (×3): 50 ug via INTRAVENOUS

## 2017-04-03 MED ORDER — FAMOTIDINE 20 MG PO TAB
20 mg | Freq: Two times a day (BID) | ORAL | 0 refills | Status: DC
Start: 2017-04-03 — End: 2017-04-06
  Administered 2017-04-04 – 2017-04-06 (×5): 20 mg via ORAL

## 2017-04-03 MED ORDER — OXYCODONE 5 MG PO TAB
5-10 mg | ORAL | 0 refills | Status: DC | PRN
Start: 2017-04-03 — End: 2017-04-05
  Administered 2017-04-03 – 2017-04-05 (×13): 5 mg via ORAL

## 2017-04-03 MED ORDER — EPHEDRINE SULFATE 50 MG/5ML SYR (10 MG/ML) (AN)(OSM)
0 refills | Status: DC
Start: 2017-04-03 — End: 2017-04-03
  Administered 2017-04-03 (×2): 20 mg via INTRAVENOUS

## 2017-04-03 MED ORDER — ACETAMINOPHEN 500 MG PO TAB
1000 mg | ORAL | 0 refills | Status: DC
Start: 2017-04-03 — End: 2017-04-06
  Administered 2017-04-03 – 2017-04-06 (×11): 1000 mg via ORAL

## 2017-04-03 MED ORDER — PHENYLEPHRINE IN 0.9% NACL(PF) 1 MG/10 ML (100 MCG/ML) IV SYRG
INTRAVENOUS | 0 refills | Status: DC
Start: 2017-04-03 — End: 2017-04-03
  Administered 2017-04-03 (×2): 200 ug via INTRAVENOUS
  Administered 2017-04-03: 15:00:00 100 ug via INTRAVENOUS
  Administered 2017-04-03: 14:00:00 200 ug via INTRAVENOUS

## 2017-04-03 MED ORDER — LACTATED RINGERS IV SOLP
500 mL | INTRAVENOUS | 0 refills | Status: CP
Start: 2017-04-03 — End: ?
  Administered 2017-04-04: 03:00:00 500 mL via INTRAVENOUS

## 2017-04-03 MED ORDER — HYDROMORPHONE (PF) 2 MG/ML IJ SYRG
.5 mg | INTRAVENOUS | 0 refills | Status: DC | PRN
Start: 2017-04-03 — End: 2017-04-03

## 2017-04-03 MED ORDER — HALOPERIDOL LACTATE 5 MG/ML IJ SOLN
1 mg | Freq: Once | INTRAVENOUS | 0 refills | Status: DC | PRN
Start: 2017-04-03 — End: 2017-04-03

## 2017-04-03 MED ORDER — DEXTROSE 5%-0.45% SODIUM CHLORIDE & POTASSIUM CHLORIDE 20 MEQ/L IV SOLP
INTRAVENOUS | 0 refills | Status: DC
Start: 2017-04-03 — End: 2017-04-04

## 2017-04-03 MED ORDER — SUFENTANIL 100 MCG IN NS 10 ML (OR)
0 refills | Status: DC
Start: 2017-04-03 — End: 2017-04-03
  Administered 2017-04-03 (×2): .3 ug/kg/h via INTRAVENOUS

## 2017-04-03 MED ORDER — NALOXONE 0.4 MG/ML IJ SOLN
.08 mg | INTRAVENOUS | 0 refills | Status: DC | PRN
Start: 2017-04-03 — End: 2017-04-06

## 2017-04-03 MED ORDER — PROPOFOL 10 MG/ML IV EMUL (INFUSION)(AM)(OR)
0 refills | Status: DC
Start: 2017-04-03 — End: 2017-04-03
  Administered 2017-04-03: 15:00:00 125 ug/kg/min via INTRAVENOUS

## 2017-04-03 MED ORDER — BUPIVACAINE 0.0625% 50ML EPIDURAL SYR (INFUSION)(AM)(OR)
0 refills | Status: DC
Start: 2017-04-03 — End: 2017-04-03
  Administered 2017-04-03: 16:00:00 6 mL/h via EPIDURAL

## 2017-04-03 MED ORDER — HYDROCODONE-ACETAMINOPHEN 5-325 MG PO TAB
1-2 | Freq: Once | ORAL | 0 refills | Status: DC | PRN
Start: 2017-04-03 — End: 2017-04-03

## 2017-04-03 MED ORDER — ENOXAPARIN 40 MG/0.4 ML SC SYRG
40 mg | Freq: Every day | SUBCUTANEOUS | 0 refills | Status: DC
Start: 2017-04-03 — End: 2017-04-06
  Administered 2017-04-04 – 2017-04-06 (×3): 40 mg via SUBCUTANEOUS

## 2017-04-03 MED ORDER — ONDANSETRON HCL (PF) 4 MG/2 ML IJ SOLN
4-8 mg | INTRAVENOUS | 0 refills | Status: DC | PRN
Start: 2017-04-03 — End: 2017-04-06
  Administered 2017-04-04: 21:00:00 4 mg via INTRAVENOUS

## 2017-04-03 MED ORDER — ONDANSETRON HCL (PF) 4 MG/2 ML IJ SOLN
INTRAVENOUS | 0 refills | Status: DC
Start: 2017-04-03 — End: 2017-04-03
  Administered 2017-04-03: 16:00:00 4 mg via INTRAVENOUS

## 2017-04-03 MED ORDER — SODIUM CHLORIDE 0.9 % IV SOLP
0 refills | Status: DC
Start: 2017-04-03 — End: 2017-04-03
  Administered 2017-04-03: 14:00:00 via INTRAVENOUS

## 2017-04-03 MED ORDER — CEFOXITIN 2 GRAM IV SOLR
0 refills | Status: DC
Start: 2017-04-03 — End: 2017-04-03
  Administered 2017-04-03: 15:00:00 2 g via INTRAVENOUS

## 2017-04-03 MED ORDER — DEXTRAN 70-HYPROMELLOSE (PF) 0.1-0.3 % OP DPET
0 refills | Status: DC
Start: 2017-04-03 — End: 2017-04-03
  Administered 2017-04-03: 14:00:00 2 [drp] via OPHTHALMIC

## 2017-04-03 MED ORDER — SUGAMMADEX 100 MG/ML IV SOLN
INTRAVENOUS | 0 refills | Status: DC
Start: 2017-04-03 — End: 2017-04-03
  Administered 2017-04-03: 17:00:00 250 mg via INTRAVENOUS

## 2017-04-03 MED ORDER — SUFENTANIL CITRATE 50 MCG/ML IV SOLN
0 refills | Status: DC
Start: 2017-04-03 — End: 2017-04-03
  Administered 2017-04-03: 14:00:00 20 ug via INTRAVENOUS

## 2017-04-03 MED ORDER — ALBUMIN, HUMAN 5 % 500 ML IV SOLP (AN)(OSM)
0 refills | Status: DC
Start: 2017-04-03 — End: 2017-04-03
  Administered 2017-04-03: 15:00:00 via INTRAVENOUS

## 2017-04-03 MED ORDER — MIDAZOLAM 1 MG/ML IJ SOLN
INTRAVENOUS | 0 refills | Status: DC
Start: 2017-04-03 — End: 2017-04-03
  Administered 2017-04-03: 14:00:00 2 mg via INTRAVENOUS

## 2017-04-03 MED ORDER — BUPIVACAINE 0.0625% 50ML PCA EPIDURAL SYR (COPY)
EPIDURAL | 0 refills | Status: DC
Start: 2017-04-03 — End: 2017-04-05
  Administered 2017-04-03 – 2017-04-05 (×7): 50.000 mL via EPIDURAL

## 2017-04-03 MED ORDER — FENTANYL CITRATE (PF) 50 MCG/ML IJ SOLN
0 refills | Status: DC
Start: 2017-04-03 — End: 2017-04-03
  Administered 2017-04-03: 14:00:00 100 ug via INTRAVENOUS

## 2017-04-03 MED ORDER — ACETAMINOPHEN 500 MG PO TAB
1000 mg | ORAL | 0 refills | Status: DC | PRN
Start: 2017-04-03 — End: 2017-04-03

## 2017-04-03 MED ADMIN — LACTATED RINGERS IV SOLP [4318]: 1000 mL | INTRAVENOUS | @ 12:00:00 | Stop: 2017-04-03 | NDC 00338011704

## 2017-04-03 NOTE — H&P (View-Only)
Interval History and Physical Note 04/03/2017    Shrimp    BP 116/83 (BP Source: Arm, Right Upper)  - Pulse 94  - Temp 36.7 ???C (98.1 ???F)  - Ht 160 cm (63)  - Wt 62.1 kg (136 lb 12.8 oz)  - SpO2 96%  - BMI 24.23 kg/m???     No results for input(s): HGB, HCT, WBC, PLTCT, NA, K, CL, CO2, BUN, CR, GLU, CA, MG, PO4, ALBUMIN, TOTPROT, TOTBILI, AST, ALT, ALKPHOS, AMY, LIPASE, PREALB, INR, PTT in the last 72 hours.     I have examined the patient and since H&P performed on 03/23/17, patient's obstructive symptoms of nausea/vomiting have completely resolved. She is having bowel movements.     Consent reviewed with patient, all questions answered to patient's satisfaction  Remain NPO prior to procedure    Dalphine Handing, MD  Team pager [phone]    [facility] [place] [facility] Surgery Consult  03/23/2017   ???  Patient: Vicki Buchanan  MRN: [id number]  ???  Admission Date:  03/23/2017, LOS: 0 days  Admission Diagnosis: No admission diagnoses are documented for this encounter.  Date of Service: March 23, 2017  ???  Reason for Consult: hx of abd CA, presents today with vomiting, worsening pain, CT with bowel obstruction  Referring Provider: Lennette Bihari, MD  Attending Surgeon: Dr. Rainey Pines, MD  Consult Performed by: Ardelle Lesches, MD  ???  ASSESSMENT: 62 y.o. 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  She presents to the ED today with nausea, vomiting and CT imaging evidence of a small bowel obstruction.  ???  PLAN:  - Admit to surgical oncology   - Bowel rest, NPO, NGT for decompression   - IVF   - Daily labs, monitor and replace electrolytes PRN   - Will discuss timing of surgical intervention with staff   - Pain control PRN   - Ppx: Lovenox, SCDs  ???  Discussed plan of care with staff surgeon, Dr. Rainey Pines, who directed plan of care.  ???  ATTESTATION  ???  I personally performed the key portions of the E/M visit, discussed case with resident and concur with resident documentation of history, physical exam, assessment, and treatment plan unless otherwise noted.   ???  I would prefer to perform her operation in a more elective manner if we can accomplish a resolution of her partial small bowel resection. I had a long discussion with the patient about the plan to get her through this obstruction conservatively without surgery. She understands.   ???  ???  Staff name:  Loralie Champagne, MD Date:  03/23/2017   ???  __________________________________________________________________________________  ???  HPI: Vicki Buchanan is a 62 y.o. 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  Patient has a history of intermittent and recurrent abdominal cramping, bloating, and diarrhea.  CT performed on 02/06/2017 showed a large volume abdominopelvic ascites, omental caking, evidence of pelvic peritoneal enhancing nodularity suspicious for peritoneal carcinomatosis, and hepatic tumor implantation.  On 02/19/2017 she underwent diagnostic laparoscopy with biopsies of the right diaphragm, right liver, right fallopian tube and left pelvis.  Pathology was positive for mucinous neoplasm with low-grade features in all 4 biopsy specimens.  CA-125 on 02/27/2017 was 90.4 and CEA was 1621.0.  She was evaluated by gynecology oncology and surgical oncology in December 2018.  At that time it was decided that patient  would undergo exploratory laparotomy with cytoreductive surgery and hyperthermic intraperitoneal chemotherapy in combination with a total abdominal hysterectomy and bilateral salpingo-oophorectomy in Jan. 2019.  Today she presents to the [facility] emergency department with complaints of persistent nausea and vomiting associated with abdominal cramping.  She reports that her symptoms began last night around 9 PM.  She had multiple episodes of emesis that progressed to dry heaves.  She denies any fevers or chills.  ???       Past Medical History:   Diagnosis Date   ??? Cancer (HCC) ???   ??? Cancer of appendix (HCC) ???   ???        Past Surgical History:   Procedure Laterality Date   ??? CESAREAN SECTION ??? 1981   ??? HX CHOLECYSTECTOMY ??? 1995   ??? LAPAROSCOPY ??? 02/2017   ??? biopsy peritoneal   ???  Medications:  No current facility-administered medications on file prior to encounter.    ???         Current Outpatient Medications on File Prior to Encounter   Medication Sig Dispense Refill   ??? acetaminophen (TYLENOL) 500 mg tablet Take 1,000 mg by mouth twice daily as needed for Pain. Max of 4,000 mg of acetaminophen in 24 hours.  ??? ???   ??? cetirizine (ZYRTEC) 10 mg tablet Take 10 mg by mouth every morning. ??? ???   ??? metroNIDAZOLE (FLAGYL) 500 mg tablet Take two tablets by mouth at 1pm. Take two tablets by mouth at 3pm. Take two tablets by mouth at 11pm. 6 tablet 0   ??? neomycin 500 mg tablet Take two tablets by mouth at 1pm. Take two tablets by mouth at 3pm. Take two tablets by mouth at 11pm. 6 tablet 0   ??? polyethylene glycol 3350 (MIRALAX) 17 g packet Take 17 g by mouth daily. ??? ???   ??? ranitidine(+) (ZANTAC) 150 mg tablet Take 150 mg by mouth daily as needed for Heartburn. ??? ???   ???  Allergies:  Shrimp  ???  Social History   ???        Socioeconomic History   ??? Marital status: Married   ??? ??? Spouse name: Not on file   ??? Number of children: Not on file   ??? Years of education: Not on file   ??? Highest education level: Not on file   Social Needs   ??? Financial resource strain: Not on file   ??? Food insecurity - worry: Not on file   ??? Food insecurity - inability: Not on file   ??? Transportation needs - medical: Not on file   ??? Transportation needs - non-medical: Not on file   Occupational History   ??? Not on file   Tobacco Use   ??? Smoking status: Never Smoker   ??? Smokeless tobacco: Never Used   Substance and Sexual Activity   ??? Alcohol use: No   ??? ??? Frequency: Never ??? ??? Comment: rarely   ??? Drug use: No   ??? Sexual activity: Not on file   Other Topics Concern   ??? Not on file   Social History Narrative   ??? Not on file   ???        Family History   Problem Relation Age of Onset   ??? Diabetes Mother ???   ??? Cancer Father ???   ??? Diabetes Brother ???   ??? Cancer-Lung Paternal Aunt ???   ??? Cancer-Breast Maternal Grandmother ???   ??? Diabetes Paternal Grandmother ???   ???  Vitals:  Vital Signs: Last Filed In 24 Hours Vital Signs: 24 Hour Range   BP: 147/88 (01/04 1600)  Temp: 36.8 ???C (98.3 ???F) (01/04 1254)  Respirations: 18 PER MINUTE (01/04 1254)  SpO2: 100 % (01/04 1600)  O2 Delivery: Nasal Cannula (01/04 1400)  SpO2 Pulse: 101 (01/04 1600)  Height: 160 cm (63) (01/04 1254) BP: (120-157)/(79-91)   Temp:  [36.8 ???C (98.3 ???F)]   Respirations:  [18 PER MINUTE]   SpO2:  [90 %-100 %]   O2 Delivery: Nasal Cannula   Intensity Pain Scale (Self Report): 7 (03/23/17 1252) ???   ???  Intake/Output:  No intake or output data in the 24 hours ending 03/23/17 1639  Physical Exam:   Well nourished, no distress.  Head: Non-traumatic  Eyes: PERRL, EOMI bilateral, no scleral Icterus  Oropharynx: moist, normal mucous membranes  Neck: no thyromegaly, JVD or LAD bilateral  Chest: CTA bilateral, normal effort  Heart: normal RR, no murmur  Abdomen: Nodularity palpated, soft, distended, non TTP, no rebound or guarding. Prior superior umbilical incision healing well.  Ext: normal ROM, no edema  Skin: warm, dry and intact  Neuro: non-focal, grossly intact  Psych:  normal mood, behavior and judgement  ???  ROS:  Review of Systems   Constitutional: Negative for chills and fever.   HENT: Negative.    Eyes: Negative.    Respiratory: Negative.  Negative for cough.    Cardiovascular: Negative.  Negative for chest pain.   Gastrointestinal: Positive for nausea and vomiting. Negative for constipation and diarrhea.   Genitourinary: Negative.    Musculoskeletal: Negative.    Skin: Negative.    Neurological: Negative. Endo/Heme/Allergies: Negative.    Psychiatric/Behavioral: Negative.    ???  ???  Lab/Radiology/Other Diagnostic Tests:       Recent Labs      03/23/17   1349  03/23/17   1403   HGB  11.4*   --    HCT  35.1*   --    WBC  9.1   --    PLTCT  547*   --    NA  140   --    K  3.9   --    CL  101   --    CO2  26   --    BUN  13   --    CR  0.71  0.7   GLU  113*   --    CA  9.9   --    ALBUMIN  3.7   --    TOTPROT  7.8   --    TOTBILI  0.4   --    AST  12   --    ALT  8   --    ALKPHOS  68   --    LIPASE  9*   --    ???  Glucose: (!) 113 (03/23/17 1349)  ???  Ardelle Lesches, MD  Pager: [phone]  ???                Electronically signed by Loralie Champagne, MD at 04/01/17 2040

## 2017-04-03 NOTE — Progress Notes
Patient arrived to room # [id number]) via cart accompanied by RN. Patient transferred to the bed with assistance. Bedside safety checks completed. Initial patient assessment completed, refer to flowsheet for details. Admission skin assessment completed by: Barnett Applebaum RN and Jarrett Soho RN    Pressure Injury Present on Hospital Admission (within 24 hours): No    1. Occiput: No  2. Ear: No  3. Scapula: No  4. Spinous Process: No  5. Shoulder: No  6. Elbow: No  7. Iliac Crest: No  8. Sacrum/Coccyx: No  9. Ischial Tuberosity: No  10. Trochanter: No  11. Knee: No  12. Malleolus: No  13. Heel: No  14. Toes: No  15. Assessed for device associated injury Yes  16. Nursing Nutrition Assessment Completed Yes    See Doc Flowsheet for additional wound details.

## 2017-04-03 NOTE — H&P (View-Only)
History and Physical Update Note    Name:  Vicki Buchanan                          Medical Record Number:  [id number]     Allergies:  Shrimp  Primary Care Physician: Nicole Cella  Verified    Lab/Radiology/Other Diagnostic Tests:  Hematology:    Lab Results   Component Value Date    HGB 10.3 03/26/2017    HCT 32.1 03/26/2017    PLTCT 421 03/26/2017    WBC 5.4 03/26/2017    NEUT 84 03/23/2017    ANC 7.60 03/23/2017    ALC 1.00 03/23/2017    MONA 5 03/23/2017    AMC 0.50 03/23/2017    ABC 0.00 03/23/2017    MCV 76.3 03/26/2017    MCHC 32.0 03/26/2017    MPV 6.8 03/26/2017    RDW 16.6 03/26/2017   , Coagulation:  No results found for: PT, PTT, INR and General Chemistry:    Lab Results   Component Value Date    NA 137 03/26/2017    K 4.1 03/26/2017    CL 106 03/26/2017    GAP 7 03/26/2017    BUN 9 03/26/2017    CR 0.73 03/26/2017    GLU 104 03/26/2017    CA 8.8 03/26/2017    ALBUMIN 3.4 03/26/2017    MG 2.0 03/26/2017    TOTBILI 0.3 03/26/2017       Last Dose Beta Blockers/Anticoagulants:  N/A      Point of Care Testing:  (Last 24 hours):         I have examined the patient, and there are no significant changes in their condition, from the previous H&P performed on 03/05/2017..consent signed, including exp lap, TAHBSO, omentectomy, tumor debulking etc.  Aware of risks.  She is aware of the risks of surgery, including injury to the bowel (< 1%) /bladder and ureter (1-2%)/vessels and nerves which may need to be repaired intraoperatively or postoperatively.  She is also aware of the risk of bowel/ureteral/bladder fistula.  Other risks including infection, DVT/PE, wound separation/cellulitis/evisceration, poor wound healing, pulmonary/cardiac complications or death were also discussed.  She is also aware of the potential need for blood transfusion and risks associated with blood, including HIV, Hepatitis (HIV 1:2,500,000; Hep C 1:2,000,000; Hep B: 150,000) If not already postmenopausal, the patient understands that removal of her ovaries will result in menopause and its associated symptoms (decreased libido, worsening hot flushes, vaginal dryness, impact on bone, heart, hair/skin) which may be treated with postoperative hormone therapy.    Lovenox or another type of blood thinner may be required postoperative, in particular if you have a cancer. Postoperative complications may include bleeding in the incision, bleeding at the wound/surgical site, anemia, requirement for blood transfusion. It can also cause thrombocytopenia as well as other problems.      Irine Seal, MD  Pager [phone]

## 2017-04-04 LAB — PHOSPHORUS: Lab: 2.9 mg/dL — ABNORMAL LOW (ref >60)

## 2017-04-04 LAB — CBC: Lab: 7.1 K/UL — ABNORMAL LOW (ref >60)

## 2017-04-04 LAB — BASIC METABOLIC PANEL: Lab: 138 MMOL/L — ABNORMAL LOW (ref 137–147)

## 2017-04-04 LAB — MAGNESIUM: Lab: 2 mg/dL — ABNORMAL HIGH (ref 1.6–2.6)

## 2017-04-04 MED ORDER — POTASSIUM PHOSPHATE, MONOBASIC 500 MG PO TBSO
2 | Freq: Once | ORAL | 0 refills | Status: CP
Start: 2017-04-04 — End: ?
  Administered 2017-04-04: 16:00:00 2 via ORAL

## 2017-04-04 NOTE — Progress Notes
CLINICAL NUTRITION                                                        Clinical Nutrition Assessment Summary     NAME:Vicki Buchanan             MRN: [id number]             DOB:April 18, 1955          AGE: 62 y.o.  ADMISSION DATE: 04/03/2017             DAYS ADMITTED: LOS: 1 day    Nutrition Assessment of Patient:  BMI Categories Adult: Acceptable: 18.5-24.9(24.23)  Unintentional Weight Loss: > 5% in 1 month (severe)  Malnutrition Assessment: Malnutrition present  Current Oral Intake: Improving  Estimated Calorie Needs: 1860-2170(30-35 kcal/kg admit wt 62.1kg)  Estimated Protein Needs: 81-93(1.3-1.5 gm/kg admit wt 62.1kg)  Oral Diet Order: Regular    ICD-10 code E43: Acute illness/Severe malnutrition    Weight loss: > 5% x 1 month, Energy intake: < 50% of estimated energy requirement for 5 days or more      Edema: No      Malnutrition Interventions: Educated pt on appropriate oral nutrition supplements. She prefers to have supplements from home (dairy free, lower sugar). Discussed that she can utilize this while inpatient if desired.    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 Pt seen for report of decreased appetite and unintentional weight loss. She reports that she hast lost ~15 lb over the last 4-5 months as she has had decreased tolerance to PO intake due to her cancer. She had been trying to eat smaller meals, more often throughout the day. Her daughter has been cooking a lot for her. They have been avoiding dairy, some wheat, and foods higher and sugar. Pt has been drinking an oral nutrition supplement called Evolve. They have been eating mainly fish and chicken for food protein sources. Her daughter has found a plant-based milk that pt enjoys. Pt has found that eating a diet with many vegetables, caused more bloating. They had questions regarding what she should be eating after ileostomy placement. Provided education on ileostomy nutrition guidelines. We discussed foods that will likely be better tolerated during her recovery from surgery. Provided a list of various foods' effect on stool output. Pt planning to choose soft/bland foods starting out which RD agreed was appropriate. Discussed that once she has recovered from surgery, she can work to gradually resume her regular diet as she tolerates. Suggested she start slowly, and keep a list of foods that she eats, to help her determine what she can/cannot tolerate. Emphasized the importance of protein in her recovery. Written materials provided along with RD contact information. RD did discuss that if she is struggling to take in adequate PO, she should not hesitate to liberalize her diet and not feel that she has to eliminate all foods with sugar, wheat, etc as it will be important to work to optimize her nutrition. She and her family verbalized understanding.     Recommendation:    ??? Continue regular diet as ordered with encouragement of good PO intakes. Pt may tolerate smaller, more frequent meals while starting out with PO intake.     Intervention / Plan:  ???  Educated on ileostomy nutrition guidelines. Handouts and RD contact information provided.  ??? Monitor PO intakes, weight trends, labs, meds, GI health.    Nutrition Diagnosis:  Unintended weight loss  Etiology: decreased tolerance to PO intake r/t peritoneal carcinomatosis  Signs & Symptoms: 5.5% weight loss x 1 month per EMR    Goals:  Patient to consume >75% of meals  Time Frame: Within 5 Days     Prevent further weight loss  Time Frame: Throughout Stay    Vicki Buchanan, [place], LD  Pager: [phone]

## 2017-04-04 NOTE — Progress Notes
2030 assessment complete.  Pt states that she want the epidural turned off because she is not comfortable how she is feeling.  Pt states she feels more numbness/tingling in bilateral thigh area, back, flank and abdomen but does not feel she had these symptoms all day.  I notified the anesthesia.  Annabell Sabal., DO came to speak with pt regarding s/sx and reasoning behind the pcea.  Pt agreed to restart the epidural and orders were given at appx 2115 (see orders).  I have assessed pt appx 1-2x per hour since this episode and she verbalize being comfortable and not excited and wants to continue with the PCEA as ordered.

## 2017-04-04 NOTE — Progress Notes
Anesthesiology Acute Pain Service     Date of Service: 04/04/2017    Name: Vicki Buchanan is a 62 y.o. female     DOB: 1955-05-21             MRN#: [id number]      PROCEDURE: Procedure(s) with comments:  EXPLORATORY LAPAROTOMY, DIVERTING LOOP ILEOSTOMY - TOTAL CASE LENGTH 8 HOURS, CLIPPING AND HIBICLENS PREP TO BE DONE IN SDS/PRE-POST, NEED SICU POST-OP, COMBO CASE WITH DR Sibyl Parr                 POD #: 1    ANALGESIA TECHNIQUE   Epidural catheter: bupivacaine 0.0625%  4/20/6    ADJUNCT ANALGESIA MEDICATIONS  fentanyl  oxycodone  acetaminophen PO     TREATMENT PLAN  Catheter removed with tip intact  and Discontinued therapy, convert to oral pain medication, analgesia to be provided by primary team      Anesthesia Pain pager: [phone]  ______________________________________________________________________    Allergies   Allergen Reactions   ??? Shrimp SEE COMMENTS     Eye irritation. Namely blood vessel issues in the eyes        Inpatient Medications  Scheduled Meds:  acetaminophen (TYLENOL) tablet 1,000 mg 1,000 mg Oral Q6H*   enoxaparin (LOVENOX) syringe 40 mg 40 mg Subcutaneous QDAY(21)   famotidine (PEPCID) tablet 20 mg 20 mg Oral BID   Continuous Infusions:  ??? bupivacaine PCA 0.0625% in NS 50mL epidural infusion syr       PRN and Respiratory Meds:calcium carbonate Q6H PRN, fentaNYL citrate PF Q1H PRN, naloxone PRN, ondansetron (ZOFRAN) IV Q6H PRN, oxyCODONE Q4H PRN, phenol PRN      Anticoagulants  enoxaparin (LOVENOX) syringe 40 mg  QDAY(21)    HPI   Visual Analog Scale (VAS)   (0-10 Scale)        At rest: 0       Patient satisfied with pain control: Yes    Side Effects: none      EXAM         Recent Vitals             Vital Signs: 24 Hour Range   BP: 95/61 (01/16 1133)  Temp: 36.8 ???C (98.2 ???F) (01/16 1133)  Pulse: 71 (01/16 1133)  Respirations: 16 PER MINUTE (01/16 1133)  SpO2: 95 % (01/16 1133)  O2 Delivery: None (Room Air) (01/16 1153) BP: (79-124)/(55-71)   Temp:  [36.5 ???C (97.7 ???F)-36.8 ???C (98.2 ???F)] Pulse:  [71-112]   Respirations:  [16 PER MINUTE-18 PER MINUTE]   SpO2:  [95 %-99 %]   O2 Delivery: None (Room Air)     Lab Results   Component Value Date    PLTCT 351 04/04/2017    WBC 7.1 04/04/2017    HGB 8.8 04/04/2017    HCT 27.3 04/04/2017    CR 0.61 04/04/2017       Level of Consciousness: Awake/alert    Neurologic Function        Sensory block: Yes        Motor: No    Insertion Site: Site clean and nontender

## 2017-04-04 NOTE — Progress Notes
PHYSICAL THERAPY  ASSESSMENT / DISCHARGE    MOBILITY:  Mobility  Progressive Mobility Level: Walk in hallway(Simultaneous filing. User may not have seen previous data.)  Distance Walked (feet): 400 ft  Level of Assistance: Independent  Assistive Device: None  Time Tolerated: 11-30 minutes  Activity Limited By: Pain    SUBJECTIVE:  Subjective  Significant hospital events: 62 y.o. Female with Peritoneal carcinomatosis   Mental / Cognitive Status: Alert;Oriented;Cooperative  Persons Present: Family  Pain: Patient complains of pain  Pain Location: Post-surgical  Pain Description: Aching  Pain Interventions: Patient agrees to participate in therapy  Ambulation Assist: Independent Mobility in Community without Device  Patient Owned Equipment: None  Home Situation: Lives with Family  Type of Home: House  Entry Stairs: 3-5 Stairs  In-Home Stairs: 1-2 Flights of Stairs    ROM:  ROM  UE ROM WFL: Yes  LE ROM WFL: Yes    STRENGTH:  Strength  Overall Strength: WFL    POSTURE/NEURO:  Posture / Neurological  Overall Sensation/Proprioception: No Deficits Noted  Coordination: No Deficits Noted    BED MOBILITY/TRANSFERS:  Bed Mobility/Transfers  Bed Mobility: Rolling: Modified Independent  Bed Mobility: Supine to Sit: Modified Independent  Comments: ed on how to log roll for pain relief  Transfer Type: Sit to/from Stand  Transfer: Assistance Level: From;Bed;Modified Independent  Transfer: Assistive Device: None  Other Transfer Type: Stand to Sit  Other Transfer: Assistance Level: To;Bed Side Chair;Modified Independent  Other Transfer: Assistive Device: None  End Of Activity Status: Up in Chair;Nursing Notified    BALANCE:  Balance  Sitting Balance: Static Sitting Balance;Dynamic Sitting Balance;No UE Support;Independent  Standing Balance: Static Standing Balance;Dynamic Standing Balance;No UE support;Independent    GAIT:  Gait  Gait Distance: 400 feet  Gait: Assistance Level: Independent  Gait: Assistive Device: None Gait: Descriptors: Pace: Normal;No balance loss;Normal step length  Stairs: Number Climbed: 3  Stairs: Descriptors: Ascend;Descend;Reciprocal  Stairs: Assistance Level: Ascend;Descend;Modified Independent  Stairs: Assistive Device: One Rail    EDUCATION:  Education  Persons Educated: Patient  Patient Barriers To Learning: None Noted  Teaching Methods: Verbal Instruction  Patient Response: Verbalized Understanding  Topics: Plan/Goals of PT Interventions;Importance of Increasing Activity;Continue Ambulation on Own    ASSESSMENT/PROGRESS:  Assessment/Progress  Impaired Mobility Due To: Post Surgical Changes  Assessment/Progress: Patient current status and level of safety suggests progression of activity can be achieved with nursing and/or family and does not require physical therapist intervention  AM-PAC 6 Clicks Basic Mobility Inpatient  Turning from your back to your side while in a flat bed without using bed rails: None  Moving from lying on your back to sitting on the side of a flatbed without using bedrails : None  Moving to and from a bed to a chair (including a wheelchair): None  Standing up from a chair using your arms (e.g. wheelchair, or bedside chair): None  To walk in hospital room: None  Climbing 3-5 steps with a railing: None  Raw Score: 24  Standardized (T-scale) Score: 57.68  Basic Mobility CMS 0-100%: 0  CMS G Code Modifier for Basic Mobility: CH    PLAN:  Plan   Plan Frequency: No Further Treatment    RECOMMENDATIONS:  PT Discharge Recommendations  PT Discharge Recommendations: Home  Equipment Recommendations: None    Therapist: Dalphine Handing, PT, DPT, [facility]  Date: 04/04/2017

## 2017-04-04 NOTE — Anesthesia Pain Rounding
Anesthesia Follow-Up Evaluation: Post-Procedure Day One    Name: Vicki Buchanan     MRN: [id number]     DOB: 04/21/1955     Age: 62 y.o.     Sex: female   __________________________________________________________________________     Procedure Date: 04/03/2017   Procedure: Procedure(s) with comments:  EXPLORATORY LAPAROTOMY, DIVERTING LOOP ILEOSTOMY - TOTAL CASE LENGTH 8 HOURS, CLIPPING AND HIBICLENS PREP TO BE DONE IN SDS/PRE-POST, NEED SICU POST-OP, COMBO CASE WITH DR [facility]    Physical Assessment  Height: 160 cm (63)  Weight: 62.1 kg (136 lb 12.8 oz)    Vital Signs (Last Filed in 24 hours)  BP: 99/55 (01/16 0803)  Temp: 36.6 ???C (97.9 ???F) (01/16 1884)  Pulse: 72 (01/16 0803)  Respirations: 16 PER MINUTE (01/16 0803)  SpO2: 96 % (01/16 0803)  O2 Delivery: None (Room Air) (01/16 0930)  SpO2 Pulse: 90 (01/15 1300)    Patient History   Allergies  Allergies   Allergen Reactions   ??? Shrimp SEE COMMENTS     Eye irritation. Namely blood vessel issues in the eyes         Medications  Scheduled Meds:  acetaminophen (TYLENOL) tablet 1,000 mg 1,000 mg Oral Q6H*   enoxaparin (LOVENOX) syringe 40 mg 40 mg Subcutaneous QDAY(21)   famotidine (PEPCID) tablet 20 mg 20 mg Oral BID   Continuous Infusions:  ??? bupivacaine PCA 0.0625% in NS 50mL epidural infusion syr       PRN and Respiratory Meds:calcium carbonate Q6H PRN, fentaNYL citrate PF Q1H PRN, naloxone PRN, ondansetron (ZOFRAN) IV Q6H PRN, oxyCODONE Q4H PRN, phenol PRN      Diagnostic Tests  Hematology: Lab Results   Component Value Date    HGB 8.8 04/04/2017    HCT 27.3 04/04/2017    PLTCT 351 04/04/2017    WBC 7.1 04/04/2017    NEUT 84 03/23/2017    ANC 7.60 03/23/2017    ALC 1.00 03/23/2017    MONA 5 03/23/2017    AMC 0.50 03/23/2017    EOSA 0 03/23/2017    ABC 0.00 03/23/2017    MCV 76.3 04/04/2017    MCH 24.5 04/04/2017    MCHC 32.1 04/04/2017    MPV 7.4 04/04/2017    RDW 17.1 04/04/2017         General Chemistry: Lab Results   Component Value Date    NA 138 04/04/2017 K 4.1 04/04/2017    CL 105 04/04/2017    CO2 27 04/04/2017    GAP 6 04/04/2017    BUN 7 04/04/2017    CR 0.61 04/04/2017    GLU 117 04/04/2017    CA 8.4 04/04/2017    ALBUMIN 3.4 03/26/2017    MG 2.0 04/04/2017    TOTBILI 0.3 03/26/2017    PO4 2.9 04/04/2017      Coagulation: No results found for: PT, PTT, INR      Follow-Up Assessment  Patient location during evaluation: floor      Anesthetic Complications:   Anesthetic complications: The patient did not experience any anesthestic complications.      Pain:  Score: 1    Management:adequate     Level of Consciousness: awake and alert   Hydration:acceptable     Airway Patency: patent   Respiratory Status: acceptable     Cardiovascular Status:acceptable   Regional/Neuroaxial:       Epidural in place

## 2017-04-04 NOTE — Consults
Wound Ostomy Note    NAME:Vicki Buchanan                                                                   MRN: [id number]                 DOB:22-May-1955          AGE: 62 y.o.  ADMISSION DATE: 04/03/2017             DAYS ADMITTED: LOS: 1 day      Reason for Consult/Visit: ostomy education    Assessment/Plan:    Active Problems:    Peritoneal carcinomatosis (HCC)    S/p ex lap, diverting loop ileostomy on 04/03/17     Ileostomy 04/03/17 0951 Lower Right Quadrant (Active)   04/03/17 0951 Lower Right Quadrant   Agree With My Assessment? Yes 04/03/2017 12:00 PM   Stoma Assessment Red, round, protruding 04/04/2017 11:00 AM   Drainage Description Brown;Serosanguineous 04/04/2017 11:00 AM   Peristomal Skin Assessment Unable to Assess 04/04/2017 11:00 AM   Dressing Status Intact 04/04/2017 11:00 AM   Stoma Miscellaneous Bridge in Place 04/04/2017 11:00 AM   Drain Output (ml) 15 ml 04/04/2017  9:30 AM   Intake / Flush (ml) 0 milliliter 04/04/2017  9:30 AM   Pouch system: 2 piece Hollister colostomy pouch    Education: Pt's husband, daughter and other family at bedside.  Written education materials delivered.  Demonstrated how to empty the pouch.  Questions answered regarding supplies, nutrition and pouch type.  Made plans to change pouch tomorrow morning with her family present.    OSTOMY CARE:  1.?????? Change pouch with any sign of leaking. Do not reinforce leaking pouch with tape  2.?????? Clean skin with water only - no soap or wipes  3.?????? If skin is broken down surrounding stoma, sprinkle stoma powder and wipe away the excess  4.??? Spray no sting skin prep on powdered skin and let dry completely  5.??? Cut new pouch leaving 1/8-1/4 of skin showing between the stoma and pouch  6.??? Warm new pouch in palm of hands  7.??? Ensure skin is dry and apply new pouch  8.??? Lay warm hand over pouch once it's applied  ???  **Bedside RN is responsible for continuing ostomy education in between visits from our service. This can be achieved by including patient (and family) in ostomy care tasks such as pouch emptying and pouch changes.**    Will continue to follow.    Lora Havens, RN, BSN, [employer], [employer]  Wound Ostomy Nursing Consult Service  Office: [phone]  Pager: [phone]  After Hours Wound/Ostomy Team Pager: [phone]

## 2017-04-04 NOTE — Case Management (ED)
Case Management Admission Assessment    NAME:Vicki Buchanan                          MRN: [id number]             DOB:08/01/1955          AGE: 62 y.o.  ADMISSION DATE: 04/03/2017             DAYS ADMITTED: LOS: 1 day      Today???s Date: 04/04/2017    Source of Information: Patient, [name] and EMR    Plan  Plan: Case Management Assessment, Discharge Planning for Home Anticipated, Assist PRN with SW/NCM Services  Patient is #1 Day Post-Op exp lap with DLI, aborted HIPEC.   ??? NCM met with patient at bedside; explained role of NCM and case management's role in discharge planning; provided patient with NCM contact information.   ??? Patient states several members of her family that are able to offer support when she is discharged; reports that her sister is a Designer, jewellery and is able to provide assistance with her new ostomy. Patient states that although she didn't feel ready to work with the WOC RN today since she is POD#1, she feels that she will be able to manage her ostomy at home independently with her husband and sister offering assistance as needed. Patient politely declines home health services at this time. Pt denies needs for DME as well.   ??? Surg Onc CM team to continue to follow patient's POC via EMR and primary team huddle; will assist with discharge planning needs as indicated.      Patient Address/Phone  [address]  [place] [address]  [phone] (home)     Emergency Contact  Extended Emergency Contact Information  Primary Emergency Contact: Studstill,Dennis  Address: [address]           [place], [place] [address] [place]  Home Phone: [phone]  Mobile Phone: [phone]  Relation: Spouse  Secondary Emergency Contact: SHAQUANTA, HARKLESS  Mobile Phone: [phone]  Relation: Daughter    Healthcare Directive  Healthcare Directive: Yes, patient has a healthcare directive  Type of Healthcare Directive: Durable power of attorney for healthcare, Healthcare directive, Living Will Location of Healthcare Directive: Patient does not have it with him/her  Would patient like to fill out a (a new) Editor, commissioning?: N/A  Psych Advance Directive (Psych unit only): No, patient does not have a Social research officer, government  Does the patient need discharge transport arranged?: No  Transportation Name, Phone and Availability #1: Patient's husband Maurine Minister to provide transportation on discharge. [phone]    Expected Discharge Date  Expected Discharge Date: 04/05/17  ??? Expected discharge date pending patient's medical stability; awaiting ROBF, pain control.     Living Situation Prior to Admission  ? Living Arrangements  Type of Residence: Home, independent  Living Arrangements: Spouse/significant other(Patient lives with husband Maurine Minister. )  Financial risk analyst / Tub: Psychologist, counselling, Tub/Shower Unit  Assistance needed prior to admit or anticipated on discharge: No  Who provides assistance or could if needed?: Patient's husband Maurine Minister, daughter [name], or sister Banker).   Are they in good health?: Yes  Can support system provide 24/7 care if needed?: Yes    ? Level of Function   Prior level of function: Independent(Previously independent with all ADLs; drives self and works part time. Requesting work note at this time. )   ???  Patient with no previous HH, LTACH, SNF, IPR, DME experience.     ??? Current Therapy recommendations:   ??? PT: Home with family   ??? OT: Home with family    ? Cognitive Abilities   Cognitive Abilities: Alert and Oriented, Participates in decision making, Engages in problem solving and planning, Recognizes impact of health condition on lifestyle    Financial Resources  ? Coverage  Primary Insurance: Optician, dispensing)  Secondary Insurance: No insurance  Additional Coverage: RX(Denies affordability concerns. )      [facility] - [address], [place] - [address]  Phone: [phone] Fax: [phone] Payor: [employer] / Plan: BCBS [place] PREF CARE BLUE / Product Type: PPO /   ? Source of Income   Source Of Income: Employed(Patient works part time at [employer]. )  ? Financial Assistance Needed?  Denies need for financial assistance including Rx coverage    Psychosocial Needs  ? Mental Health  Mental Health History: No  ? Substance Use History  Substance Use History Screen: No    Current/Previous Services  ? PCP  Nicole Cella, [phone], [phone]    ? Pharmacy    [facility] [address] [place] - [address]  [address] [place] [address]  Phone: [phone] Fax: [phone]    ? Durable Medical Equipment   Durable Medical Equipment at home: None  ? Home Health  Receiving home health: No  ? Hemodialysis or Peritoneal Dialysis  Undergoing hemodialysis or peritoneal dialysis: No  ? Tube/Enteral Feeds     ? Infusion  Receive infusions: No  ? Private Duty  Private duty help used: No  ? Home and Community Based Services  Home and community based services: No  ? [employer]: N/A  ? Hospice  Hospice: No  ? Outpatient Therapy  PT: No  OT: No  SLP: No  ? Skilled Nursing Facility/Nursing Home  SNF: No  NH: No  ? Inpatient Rehab  IPR: No  ? Long-Term Acute Care Hospital  LTACH: No  ? Acute Hospital Stay  Acute Hospital Stay: Yes  Was patient's stay within the last 30 days?: Yes  When did patient receive care?: 03/23/17-03/26/17 (SBO)  Name of hospital: [facility]  Readmission Code Group: 8. Scheduled Readmission  8. Scheduled Readmission: 8c. Surgery/procedure  Related or Unrelated?: Related        ** Anticipated CM Needs: Work note    Barrie Lyme BSN, RN  Inpatient Nurse Case Manager  Surgical Oncology  Office: [phone]  Pager: [phone]  M-F [phone]

## 2017-04-04 NOTE — Progress Notes
Surgical Oncology Progress Note 04/04/2017     Patient: Vicki Buchanan  MRN: [id number]  Admission date: 04/03/2017, LOS: 1 day    Assessment/Plan  Vicki Buchanan is a 62 y.o. 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 Alvan Dame who directed the plan of care  ________________________________________________________________________   Subjective  Hypotensive overnight with b/l leg numbness. Anesthesia addressed with decrease in epidural dose. No nausea/vomiting. Sat in chair for couple hours yesterday.     Objective  BP 124/62 (BP Source: Arm, Left Upper)  - Pulse 79  - Temp 36.6 C (97.9 F)  - Ht 160 cm (63")  - Wt 62.1 kg (136 lb 12.8 oz)  - SpO2 99%  - BMI 24.23 kg/m     Physical Exam  General: alert, cooperative, NAD  HEENT: normocephalic/atraumatic, non-icteric  Cardio: regular rate, regular rhythm   Pulm: non-labored respirations on RA  Abd: soft, distended, appropriately TTP, silver dressing in place, pink stoma with large amount of bowel sweat in bag  Ext: warm, dry, no edema/cyanosis  Neuro: grossly intact  Psych: behavior and mood appropriate      Labs  Recent Labs      04/04/17   0405   HGB  8.8*   HCT  27.3*   WBC  7.1   PLTCT  351   NA  138   K  4.1   CL  105   CO2  27   BUN  7   CR  0.61   GLU  117*   CA  8.4*   MG  2.0   PO4  2.9   Glucose: (!) 117 (04/04/17 0405)    Intake/Output Summary (Last 24 hours) at 04/04/2017 0601  Last data filed at 04/04/2017 0442  Gross per 24 hour   Intake 4463.7 ml   Output 1175 ml   Net 3288.7 ml          Burman Freestone, MD  Team pager [phone]

## 2017-04-04 NOTE — Progress Notes
Pt assisted x2 to sit on EOB. Pt grimacing in pain, pursed lip breathing, but states the PRN oxycodone has been helping since decrease in epidural. Pt instructed to use pillow as stomach split. States she will try to remain seated for breakfast. Foley was removed at 0630, to void by 1230. Will cont to monitor.

## 2017-04-04 NOTE — Progress Notes
OCCUPATIONAL THERAPY  ASSESSMENT NOTE    Patient Name: Vicki Buchanan                   Room/Bed: ZO1096/04  Admitting Diagnosis:  Peritoneal carcinomatosis (HCC) [C78.6, C80.1]    Mobility  Progressive Mobility Level: Walk in hallway  Distance Walked (feet): 400 ft  Level of Assistance: Independent  Assistive Device: None  Time Tolerated: 11-30 minutes  Activity Limited By: Pain    Subjective  Pertinent Dx per Physician: 63 y.o. 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  She presents to the ED today with nausea, vomiting and CT imaging evidence of a small bowel obstruction.  Pain / Complaints: Patient agrees to participate in therapy;Patient encouraged to use PCA (IV)  Pain Location: Incisional  Comments: Upon arrival, patient supine in bed. After session, patient up in chair, alarm on, and all needs in reach. RN notified patient can be up ad lib once PCA removed.     Objective  Psychosocial Status: Willing and Cooperative to Participate  Persons Present: Family;Physical Therapist    Home Living  Type of Home: House  Home Layout: Two Level  Financial risk analyst / Tub: Psychologist, counselling;Tub/Shower Unit  Bathroom Toilet: Standard    Prior Function  Level Of Independence: Independent with ADLs and functional transfers;Independent with homemaking w/ ambulation  Lives With: Spouse  Receives Help From: None Needed  Vocational: Full Time Employment    Vision  Current Vision: No Visual Deficits;Wears Glasses All of the Time    ADL's  Where Assessed: Edge of Bed;In Bathroom;Standing at [employer] Assist: Modified Independent  Grooming Deficits: Supervision/Safety  LE Dressing Assist: Modified Independent  LE Dressing Deficits: Supervision/Safety  Toileting Assist: Modified Independent  Toileting Deficits: Supervision/Safety  Functional Transfer Assist: Modified Independent Functional Transfer Deficits: Supervision/Safety  Comment: Patient demonstrates good safety and judgment with all ADLs and related transfers this date, requiring no assistance.     Activity Tolerance  Endurance: 4/5 Tolerates 30+ Minutes Exercise W/O Fatigue  Sitting Balance: 4+/5 Moves/Returns Trunkal Midpoint 1-2 Inches in Multiple Planes    Cognition  Overall Cognitive Status: WFL to Adequately Complete Self Care Tasks Safely  Memory: WFL Adequate to Recall Day to Day Activities  Orientation: Alert & Oriented x4  Attention: Awake/Alert    UE PROM  Overall BUE PROM WNL: Yes    UE AROM  Overall BUE AROM WNL: Yes    Sensory  Overall Sensory: Pt Perceives Pressure in Both UEs in Gross Exam    UE Strength / Tone  Overall Strength / Tone: WFL Able to Perform ADL Tasks    Education  Persons Educated: Patient/Family  Barriers To Learning: None Noted  Interventions: Repetition of Instructions  Teaching Methods: Verbal Instruction  Patient Response: Verbalized Understanding  Topics: Role of OT, Goals for Therapy  Goal Formulation: With Patient/Family    Assessment  No Skilled OT: No Acute OT Goals Identified;Safe To Return Home  Comments: Once PCA removed, patient is safet to return home.     Plan  Progress: Discontinue OT  OT Frequency: No Further Treatment    OT Discharge Recommendations  OT Discharge Recommendations: Home with family assist  Equipment Recommendations: None    Therapist: Debbora Dus, OTR/L [id number]  Date: 04/04/2017

## 2017-04-05 ENCOUNTER — Encounter: Admit: 2017-04-05 | Discharge: 2017-04-05 | Payer: BC Managed Care – PPO

## 2017-04-05 DIAGNOSIS — C181 Malignant neoplasm of appendix: Principal | ICD-10-CM

## 2017-04-05 DIAGNOSIS — C801 Malignant (primary) neoplasm, unspecified: ICD-10-CM

## 2017-04-05 LAB — BASIC METABOLIC PANEL: Lab: 136 MMOL/L — ABNORMAL LOW (ref 137–147)

## 2017-04-05 LAB — PHOSPHORUS: Lab: 3.1 mg/dL — ABNORMAL LOW (ref 2.0–4.5)

## 2017-04-05 LAB — CBC: Lab: 7.8 10*3/uL — ABNORMAL HIGH (ref >60)

## 2017-04-05 LAB — MAGNESIUM: Lab: 1.9 mg/dL — ABNORMAL LOW (ref 1.6–2.6)

## 2017-04-05 MED ORDER — LACTATED RINGERS IV SOLP
INTRAVENOUS | 0 refills | Status: DC
Start: 2017-04-05 — End: 2017-04-05

## 2017-04-05 MED ORDER — KETOROLAC 15 MG/ML IJ SOLN
15 mg | INTRAVENOUS | 0 refills | Status: DC
Start: 2017-04-05 — End: 2017-04-06
  Administered 2017-04-05 – 2017-04-06 (×2): 15 mg via INTRAVENOUS

## 2017-04-05 MED ORDER — HYDROXYZINE HCL 25 MG PO TAB
25 mg | Freq: Three times a day (TID) | ORAL | 0 refills | Status: DC | PRN
Start: 2017-04-05 — End: 2017-04-06
  Administered 2017-04-06: 03:00:00 25 mg via ORAL

## 2017-04-05 MED ORDER — MAGNESIUM SULFATE IN D5W 1 GRAM/100 ML IV PGBK
1 g | Freq: Once | INTRAVENOUS | 0 refills | Status: CP
Start: 2017-04-05 — End: ?
  Administered 2017-04-05: 15:00:00 1 g via INTRAVENOUS

## 2017-04-05 MED ORDER — OXYCODONE 5 MG PO TAB
5-15 mg | ORAL | 0 refills | Status: DC | PRN
Start: 2017-04-05 — End: 2017-04-06
  Administered 2017-04-05: 10 mg via ORAL
  Administered 2017-04-05: 20:00:00 5 mg via ORAL
  Administered 2017-04-06 (×2): 10 mg via ORAL
  Administered 2017-04-06: 18:00:00 5 mg via ORAL

## 2017-04-05 NOTE — Progress Notes
Anesthesiology Acute Pain Service     Date of Service: 04/05/2017    Name: Vicki Buchanan is a 63 y.o. female     DOB: 1955/05/09             MRN#: [id number]      PROCEDURE: Procedure(s) with comments:  EXPLORATORY LAPAROTOMY, DIVERTING LOOP ILEOSTOMY - TOTAL CASE LENGTH 8 HOURS, CLIPPING AND HIBICLENS PREP TO BE DONE IN SDS/PRE-POST, NEED SICU POST-OP, COMBO CASE WITH DR CHAPMAN                 POD #: 2    ANALGESIA TECHNIQUE   Epidural catheter: bupivacaine 0.0625%  4/20/6    ADJUNCT ANALGESIA MEDICATIONS  fentanyl  oxycodone  acetaminophen PO     TREATMENT PLAN  Will plan to discontinue epidural therapy this afternoon and convert to oral pain medication. Further analgesia to be provided by primary team      Pt discussed with Dr. Berton Lan, MD  Anesthesiology PGY-1    Anesthesia Pain pager: [phone]  ______________________________________________________________________    Allergies   Allergen Reactions   ??? Shrimp SEE COMMENTS     Eye irritation. Namely blood vessel issues in the eyes        Inpatient Medications  Scheduled Meds:    acetaminophen (TYLENOL) tablet 1,000 mg 1,000 mg Oral Q6H*   enoxaparin (LOVENOX) syringe 40 mg 40 mg Subcutaneous QDAY(21)   famotidine (PEPCID) tablet 20 mg 20 mg Oral BID   Continuous Infusions:  ??? bupivacaine PCA 0.0625% in NS 50mL epidural infusion syr       PRN and Respiratory Meds:calcium carbonate Q6H PRN, fentaNYL citrate PF Q1H PRN, naloxone PRN, ondansetron (ZOFRAN) IV Q6H PRN, oxyCODONE Q4H PRN, phenol PRN      Anticoagulants  enoxaparin (LOVENOX) syringe 40 mg  QDAY(21)    HPI   Visual Analog Scale (VAS)   (0-10 Scale)        At rest: 6       Patient satisfied with pain control: Yes    Side Effects: none      EXAM         Recent Vitals             Vital Signs: 24 Hour Range   BP: 104/80 (01/17 1132)  Temp: 36.8 ???C (98.3 ???F) (01/17 1132)  Pulse: 74 (01/17 1132)  Respirations: 16 PER MINUTE (01/17 1132)  SpO2: 95 % (01/17 1132) O2 Delivery: None (Room Air) (01/17 1132) BP: (101-117)/(59-80)   Temp:  [36.7 ???C (98.1 ???F)-36.9 ???C (98.4 ???F)]   Pulse:  [67-76]   Respirations:  [16 PER MINUTE-18 PER MINUTE]   SpO2:  [93 %-96 %]   O2 Delivery: None (Room Air)       Lab Results   Component Value Date    PLTCT 349 04/05/2017    WBC 7.8 04/05/2017    HGB 9.0 04/05/2017    HCT 28.5 04/05/2017    CR 0.75 04/05/2017       Level of Consciousness: Awake/alert    Neurologic Function        Sensory block: Yes        Motor: No    Insertion Site: Site clean and nontender

## 2017-04-05 NOTE — Progress Notes
During rounding team informed nurse that PCEA would be discontinued. Syringe empty. Team informed nurse it was okay not to replace syringe. Anesthesia will remove.

## 2017-04-05 NOTE — Progress Notes
Wound Ostomy Note    NAME:Vicki Buchanan                                                                   MRN: [id number]                 DOB:Oct 04, 1955          AGE: 62 y.o.  ADMISSION DATE: 04/03/2017             DAYS ADMITTED: LOS: 2 days      Reason for Consult/Visit: ostomy education    Assessment/Plan:    Active Problems:    Peritoneal carcinomatosis (HCC)    Severe malnutrition (HCC)       Ileostomy 04/03/17 0951 Lower Right Quadrant (Active)   04/03/17 0951 Lower Right Quadrant   Agree With My Assessment? Yes 04/04/2017  4:37 PM   Stoma Assessment Red, oval, protruding 04/05/2017 10:00 AM   Drainage Description Brown;Watery 04/05/2017 10:00 AM   Peristomal Skin Assessment Intact 04/05/2017 10:00 AM   Dressing Status Changed/New 04/05/2017 10:00 AM   Stoma Miscellaneous Bridge in Place 04/05/2017 10:00 AM   Drain Output (ml) 25 ml 04/05/2017 10:00 AM   Intake / Flush (ml) 0 milliliter 04/04/2017  4:37 PM   Pouch system: 1 piece Hollister colostomy pouch [phone]  ???  Education: Pt's husband, daughter and other family at bedside.  Family and pt states that they read the written education.  Demonstrated how to empty/change pouch. Topics discussed: frequency of emptying/changing pouch, how to empty/change pouch, stoma/peristomal skin care, pouch options, how to cut pouch, how to obtain pouches. Questions answered.   Encouraged pt to practice emptying pouch with nursing's assistance.  ???  OSTOMY CARE:  1.?????? Change pouch with any sign of leaking. Do not reinforce leaking pouch with tape  2.?????? Clean skin with water only - no soap or wipes  3.?????? If skin is broken down surrounding stoma, sprinkle stoma powder and wipe away the excess  4.??? Spray no sting skin prep on powdered skin and let dry completely  5.??? Cut new pouch leaving 1/8-1/4 of skin showing between the stoma and pouch  6.??? Warm new pouch in palm of hands  7.??? Ensure skin is dry and apply new pouch  8.??? Lay warm hand over pouch once it's applied  ??? **Bedside RN is responsible for continuing ostomy education in between visits from our service. This can be achieved by including patient (and family) in ostomy care tasks such as pouch emptying and pouch changes.**  ???  Will continue to follow.  ???  Lora Havens, RN, BSN, [employer], [employer]  Wound Ostomy Nursing Consult Service  Office: [phone]  Pager: [phone]  After Hours Wound/Ostomy Team Pager: [phone]

## 2017-04-05 NOTE — Progress Notes
Surgical Oncology Progress Note 04/05/2017     Patient: Vicki Buchanan  MRN: [id number]  Admission date: 04/03/2017, LOS: 2 days    Assessment/Plan  Vicki Buchanan is a 62 y.o. 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 Alvan Dame who directed the plan of care  _____________________________________________________________________  Subjective  No acute events overnight. Patient wanted to keep epidural in overnight so it will be removed today. She plans to walk a lot today. She is tolerating diet and urinating.     Objective  BP 117/69 (BP Source: Arm, Left Upper)  - Pulse 67  - Temp 36.9 C (98.4 F)  - Ht 160 cm (63")  - Wt 62.1 kg (136 lb 12.8 oz)  - SpO2 95%  - BMI 24.23 kg/m     Physical Exam  General: alert, cooperative, NAD  HEENT: normocephalic/atraumatic, non-icteric  Cardio: regular rate, regular rhythm   Pulm: non-labored respirations on RA  Abd: soft, distended, appropriately TTP, silver dressing in place, pink stoma with large amount of bowel sweat in bag  Ext: warm, dry, no edema/cyanosis  Neuro: grossly intact  Psych: behavior and mood appropriate      Labs  Recent Labs      04/04/17   0405   HGB  8.8*   HCT  27.3*   WBC  7.1   PLTCT  351   NA  138   K  4.1   CL  105   CO2  27   BUN  7   CR  0.61   GLU  117*   CA  8.4*   MG  2.0   PO4  2.9       Intake/Output Summary (Last 24 hours) at 04/05/2017 0622  Last data filed at 04/05/2017 0325  Gross per 24 hour   Intake 600 ml   Output 1100 ml   Net -500 ml          Burman Freestone, MD  Team pager [phone]

## 2017-04-05 NOTE — Progress Notes
During huddle and rounds there were conflicting orders on removal of epidural. At [place], this RN spoke with on-call [place] resident, Dr. Gari Crown. Per Gari Crown, Dr. Oneta Rack stated epidural can be left on until morning. RN will update anesthesilogy team. Will cont to monitor.

## 2017-04-06 ENCOUNTER — Encounter: Admit: 2017-04-06 | Discharge: 2017-04-06 | Payer: BC Managed Care – PPO

## 2017-04-06 DIAGNOSIS — Z9049 Acquired absence of other specified parts of digestive tract: ICD-10-CM

## 2017-04-06 DIAGNOSIS — E43 Unspecified severe protein-calorie malnutrition: ICD-10-CM

## 2017-04-06 DIAGNOSIS — R200 Anesthesia of skin: ICD-10-CM

## 2017-04-06 DIAGNOSIS — K56609 Unspecified intestinal obstruction, unspecified as to partial versus complete obstruction: ICD-10-CM

## 2017-04-06 DIAGNOSIS — Z6824 Body mass index (BMI) 24.0-24.9, adult: ICD-10-CM

## 2017-04-06 DIAGNOSIS — R180 Malignant ascites: ICD-10-CM

## 2017-04-06 DIAGNOSIS — R1900 Intra-abdominal and pelvic swelling, mass and lump, unspecified site: ICD-10-CM

## 2017-04-06 DIAGNOSIS — I959 Hypotension, unspecified: ICD-10-CM

## 2017-04-06 DIAGNOSIS — C786 Secondary malignant neoplasm of retroperitoneum and peritoneum: Principal | ICD-10-CM

## 2017-04-06 DIAGNOSIS — Z85038 Personal history of other malignant neoplasm of large intestine: ICD-10-CM

## 2017-04-06 DIAGNOSIS — Z90710 Acquired absence of both cervix and uterus: ICD-10-CM

## 2017-04-06 LAB — MAGNESIUM: Lab: 1.9 mg/dL — ABNORMAL LOW (ref >60)

## 2017-04-06 LAB — PHOSPHORUS: Lab: 3.7 mg/dL — ABNORMAL LOW (ref 2.0–4.5)

## 2017-04-06 LAB — BASIC METABOLIC PANEL: Lab: 133 MMOL/L — ABNORMAL LOW (ref >60)

## 2017-04-06 LAB — CBC: Lab: 5.2 K/UL (ref 4.5–11.0)

## 2017-04-06 MED ORDER — OXYCODONE 5 MG PO TAB
5-15 mg | ORAL_TABLET | ORAL | 0 refills | 6.00000 days | Status: AC | PRN
Start: 2017-04-06 — End: 2017-07-16
  Filled 2017-04-06 (×2): qty 40, 3d supply, fill #1

## 2017-04-06 MED ORDER — MAGNESIUM SULFATE IN D5W 1 GRAM/100 ML IV PGBK
1 g | Freq: Once | INTRAVENOUS | 0 refills | Status: DC
Start: 2017-04-06 — End: 2017-04-06

## 2017-04-06 NOTE — Progress Notes
Text paged SON team regarding patient's urine output all day before shift change was 383mL in a 12 hour shift. Bladder scanned patient after void of 46mLs at 2116 and got 482mLs post void. Got a one time order to straight cath patient and only got 81mLs from straight cath. Re-checked BS and scan showing 476 & 795. Spoke with MD Guest over the phone and she told me to re-check bladder scan at 0400 if patient hadn't voided. Patient voided 233mL and post void scans showed 470& 496. Mentioned to Dr. Elder Cyphers I think bladder scanner is picking up ascities fluid. Will continue to monitor.

## 2017-04-06 NOTE — Progress Notes
Surgical Oncology Progress Note 04/06/2017     Patient: Vicki Buchanan  MRN: [id number]  Admission date: 04/03/2017, LOS: 3 days    Assessment/Plan  Vicki Buchanan is a 62 y.o. 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 Rainey Pines who directed the plan of care  _____________________________________________________________________  Subjective  No acute events overnight. Ambulating. Pain well controlled. Tolerating regular diet, no nausea or vomiting. Having ostomy output.     Objective  BP 115/67 (BP Source: Arm, Left Upper)  - Pulse 77  - Temp 36.8 ???C (98.3 ???F)  - Ht 160 cm (63)  - Wt 62.1 kg (136 lb 12.8 oz)  - SpO2 93%  - BMI 24.23 kg/m???     Physical Exam  General: alert, cooperative, NAD  HEENT: normocephalic/atraumatic, non-icteric  Cardio: regular rate, regular rhythm   Pulm: non-labored respirations on RA  Abd: soft, distended, appropriately TTP, silver dressing in place, pink stoma with liquid stool in bag.   Ext: warm, dry, no edema/cyanosis  Neuro: grossly intact  Psych: behavior and mood appropriate      Labs  Recent Labs      04/04/17   0405  04/05/17   0512   HGB  8.8*  9.0*   HCT  27.3*  28.5*   WBC  7.1  7.8   PLTCT  351  349   NA  138  136*   K  4.1  4.4   CL  105  102   CO2  27  29   BUN  7  7   CR  0.61  0.75   GLU  117*  111*   CA  8.4*  8.7   MG  2.0  1.9   PO4  2.9  3.1       Intake/Output Summary (Last 24 hours) at 04/06/2017 0557  Last data filed at 04/06/2017 0440  Gross per 24 hour   Intake 1550 ml   Output 635 ml   Net 915 ml Ardelle Lesches, MD  Team pager [phone]

## 2017-04-06 NOTE — Progress Notes
Text paged SON team to get something added to [facility] for itchy sensation patient is experiencing after taking pain medications. No visible rash noted. MD Guest added Hydroxyzine to alleviate itch three times daily PRN. Will continue to monitor.

## 2017-04-06 NOTE — Progress Notes
Wound Ostomy Note    NAME:Vicki Buchanan                                                                   MRN: [id number]                 DOB:1955-06-11          AGE: 62 y.o.  ADMISSION DATE: 04/03/2017             DAYS ADMITTED: LOS: 3 days      Reason for Consult/Visit: ostomy education    Assessment/Plan:    Active Problems:    Peritoneal carcinomatosis (HCC)    Severe malnutrition (HCC)      Ileostomy 04/03/17 0951 Lower Right Quadrant (Active)   04/03/17 0951 Lower Right Quadrant   Agree With My Assessment? Yes 04/06/2017  4:12 AM   Stoma Assessment Red, oval, protruding 04/06/2017  9:30 AM   Drainage Description Watery;Brown 04/06/2017  9:30 AM   Peristomal Skin Assessment Intact 04/06/2017  9:30 AM   Dressing Status Changed/New 04/06/2017  9:30 AM   Stoma Miscellaneous Bridge Removed - by surgery team 04/06/2017  9:30 AM   Drain Output (ml) 0 ml 04/06/2017  8:13 AM   Intake / Flush (ml) 0 milliliter 04/04/2017  4:37 PM   Pouch system: 1 piece Hollister colostomy pouch [phone]  ???  Education: Pt's husband, daughter and other family at bedside.  Pt's husband change pouch with minimal assistance.   Topics discussed: frequency of emptying/changing pouch, how to empty/change pouch, stoma/peristomal skin care, pouch options, how to cut pouch, how to obtain pouches. Questions answered.  Supplies at bedside for pt to take home.  Provided pt with information on the Coloplast hernia belt, online ostomy resources and the support group at [facility].  ???  OSTOMY CARE:  1.?????? Change pouch with any sign of leaking. Do not reinforce leaking pouch with tape  2.?????? Clean skin with water only - no soap or wipes  3.?????? If skin is broken down surrounding stoma, sprinkle stoma powder and wipe away the excess  4.??? Spray no sting skin prep on powdered skin and let dry completely  5.??? Cut new pouch leaving 1/8-1/4 of skin showing between the stoma and pouch  6.??? Warm new pouch in palm of hands  7.??? Ensure skin is dry and apply new pouch 8.??? Lay warm hand over pouch once it's applied  ???  **Bedside RN is responsible for continuing ostomy education in between visits from our service. This can be achieved by including patient (and family) in ostomy care tasks such as pouch emptying and pouch changes.**  ???  Will continue to follow.  ???  Lora Havens, RN, BSN, [employer], [employer]  Wound Ostomy Nursing Consult Service  Office: [phone]  Pager: [phone]  After Hours Wound/Ostomy Team Pager: [phone]

## 2017-04-08 NOTE — Discharge Instructions - Pharmacy
Physician Discharge Summary      Name: Vicki Buchanan  Medical Record Number: [id number]        Account Number:  [id number]  Date Of Birth:  1955-10-13                         Age:  62 years   Admit date:  04/03/2017                     Discharge date: 04/06/2017 11:44 AM    Attending Physician: Dr. Loralie Champagne*               Service: Surgery-Oncology    Physician Summary completed by: Reino Kent, MD    Reason for hospitalization: Peritoneal carcinomatosis [facility]); Peritoneal carcinomatosis (HCC)    Significant PMH:   Past Medical History:   Diagnosis Date   ??? Cancer (HCC)    ??? Cancer of appendix (HCC)         Allergies: Shrimp    Admission Physical Exam notable for:    Well nourished, no distress.  Head: Non-traumatic  Eyes: PERRL, EOMI bilateral, no scleral Icterus  Oropharynx: moist, normal mucous membranes  Neck: no thyromegaly, JVD or LAD bilateral  Chest: CTA bilateral, normal effort  Heart: normal RR, no murmur  Abdomen:???Nodularity palpated, soft, distended, non TTP, no rebound or guarding. Prior superior umbilical incision healing well.  Ext: normal ROM, no edema  Skin: warm, dry and intact  Neuro: non-focal, grossly intact  Psych: ???normal mood, behavior and judgement    Admission Lab/Radiology studies notable for:   No results found for this visit on 04/03/17 (from the past 24 hour(s)).    Brief Hospital Course:  The patient was admitted and the following issues were addressed during this hospitalization: (with pertinent details).    62 y.o. 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 Post-operative course was uneventful; epidural d/c on POD1. At discharge, the patient was tolerating a regular diet, pain was controlled with PO regimen and had appropriate ostomy output.     Condition at Discharge: Stable    Discharge Diagnoses:    Hospital Problems        Active Problems    Peritoneal carcinomatosis (HCC)    Severe malnutrition (HCC) Active Problems:    Peritoneal carcinomatosis (HCC)    Severe malnutrition (HCC)      Patient Active Problem List    Diagnosis   ??? (Hosp)  Severe malnutrition (HCC)   ??? SBO (small bowel obstruction) (HCC)   ??? [facility])  Peritoneal carcinomatosis [facility])       Surgical Procedures:   Procedure(s) (LRB):  EXPLORATORY LAPAROTOMY, DIVERTING LOOP ILEOSTOMY (N/A)    Significant Diagnostic Studies and Procedures: noted in brief hospital course    Consults:    CONSULT WOUND/OSTOMY TEAM NURSE    Patient Disposition: Home       Patient instructions/medications:      Activity as Tolerated    It is important to keep increasing your activity level after you leave the hospital.  Moving around can help prevent blood clots, lung infection (pneumonia) and other problems.  Gradually increasing the number of times you are up moving around will help you return to your normal activity level more quickly.  Continue to increase the number of times you are up to the chair and walking daily to return to your normal activity level. Begin to work  towards your normal activity level at discharge.     Driving Restrictions    No driving while taking pain medication.     Lifting Restrictions    No lifting more than 10 pounds (or a gallon of milk) for the next 6 weeks.     Discharge Signs/Symptoms    Please contact your doctor if you have any of the following symptoms: temperature higher than 101.4 degrees F, uncontrolled pain, persistent nausea and/or vomiting, difficulty breathing, chest pain, severe abdominal pain, headache, unable to urinate, unable to have bowel movement or drainage with a foul odor.     Questions About Your Stay    Standard  For questions or concerns regarding your hospital stay:    -DURING BUSINESS HOUR (8:00 AM - 4:30 PM):  Call [phone] and ask to be transferred to your discharge attending physician.    -AFTER BUSINESS HOURS (4:30 PM - 8:00 AM, on weekends, or holidays): Call [phone] and ask the operator to page the on-call doctor for the discharge attending physician.     Discharging attending physician: Loralie Champagne [[id number]]      Regular Diet    You have no dietary restriction. Please continue with a healthy balanced diet.     Wound Care    Keep wound clean and dry.  Do not submerge under water.     Incision Care    *Keep your incision clean and dry.  *May shower 2 days following procedure. Ok to allow warm soapy water to flow over your incision. Pat dry afterward.   *Do not submerge incision in tub, pool, hot tub, or lake for 4 weeks.  *There are no stitches to be removed. There is Dermabond (surgical glue) over your incision site. This will peel off on its own over the next few weeks.   *Your incision should gradually look better each day. If you notice unusual swelling, redness, drainage, have increasing pain at the site, or have a fever greater than 101.4 degrees, notify your physician immediately.     Return Appointment    Please call [phone] to confirm your 2 week follow up appointment     [place] Provider AL-KASSPOOLES, Jerlyn Ly [phone]      Opioid (Narcotic) Safety Information    OPIOID (NARCOTIC) PAIN MEDICATION SAFETY    We care about your comfort, and believe you need opioid medications at this time to treat your pain.  An opioid is a strong pain medication.  It is only available by prescription for moderate to severe pain.  Usually these medications are used for only a short time to treat pain, but sometimes will be prescribed for longer.  Talk with your doctor or nurse about how long they expect you to need this medication.    When used the right way, opioids are safe and effective medications to treat your pain, even when used for a long time.  Yet, when used in the wrong way, opioids can be dangerous for you or others.  Opioids do not work for everyone.  Most patients do not get full relief of their pain from opioid medication; full relief of your pain may not be possible.     For your safety, we ask you to follow these instructions:    *Only take your opioid medication as prescribed.  If your pain is not controlled with the prescribed dose, or the medication is not lasting long enough, call your doctor.  *Do not break or crush your opioid medication unless your doctor  or pharmacist says you can.  With certain medications, this can be dangerous, and may cause death.  *Never share your medications with others, even if they appear to have a good reason.  Never take someone else's pain medication-this is dangerous, and illegal (a crime).  Overdoses and deaths have occurred.  *Keep your opioid medications safe, as you would with cash, in a lock box or similar container.  *Make sure your opioids are going to be secure, especially if you are around children or teens.  *Talk with your doctor or pharmacist before you take other medications.  *Avoid driving, operating machinery, or drinking alcohol while taking opioid pain medication.  This may be unsafe.    Pain medications can cause constipation. Constipation is bowel movements that are less often than normal. Stools often become very hard and difficult to pass. This may lead to stomach pain and bloating. It may also cause pain when trying to use the bathroom. Constipation may be treated with suppositories, laxatives or stool softeners. A diet high in fiber with plenty of fluids helps to maintain regular, soft bowel movements.      Current Discharge Medication List       START taking these medications    Details   oxyCODONE (ROXICODONE, OXY-IR) 5 mg tablet Take one tablet to three tablets by mouth every 4 hours as needed  Qty: 40 tablet, Refills: 0    PRESCRIPTION TYPE:  Print          CONTINUE these medications which have NOT CHANGED    Details   acetaminophen (TYLENOL) 500 mg tablet Take 1,000 mg by mouth twice daily as needed for Pain. Max of 4,000 mg of acetaminophen in 24 hours.     PRESCRIPTION TYPE:  Historical Med      cetirizine (ZYRTEC) 10 mg tablet Take 10 mg by mouth every morning.    PRESCRIPTION TYPE:  Historical Med      polyethylene glycol 3350 (MIRALAX) 17 g packet Take 17 g by mouth daily.    PRESCRIPTION TYPE:  Historical Med      ranitidine(+) (ZANTAC) 150 mg tablet Take 150 mg by mouth daily as needed for Heartburn.    PRESCRIPTION TYPE:  Historical Med            Future Appointments   Date Time Provider Department Center   04/18/2017  1:20 PM Vanita Ingles, MD [facility] [place] Exam   04/18/2017  2:20 PM Loralie Champagne, MD [facility] [place] Exam       Pending items needing follow up: Will review pathology with pt at f/u appointment.     Signed:  Reino Kent, MD  04/08/2017      cc:  Primary Care Physician:  Nicole Cella   Verified  Referring physicians:     Additional provider(s):

## 2017-04-18 ENCOUNTER — Encounter: Admit: 2017-04-18 | Discharge: 2017-04-18 | Payer: BC Managed Care – PPO

## 2017-04-18 DIAGNOSIS — C181 Malignant neoplasm of appendix: Principal | ICD-10-CM

## 2017-04-18 DIAGNOSIS — C801 Malignant (primary) neoplasm, unspecified: ICD-10-CM

## 2017-04-18 DIAGNOSIS — C482 Malignant neoplasm of peritoneum, unspecified: Principal | ICD-10-CM

## 2017-04-18 DIAGNOSIS — C786 Secondary malignant neoplasm of retroperitoneum and peritoneum: ICD-10-CM

## 2017-04-18 MED ORDER — BEVACIZUMAB IVPB
5 mg/kg | Freq: Once | INTRAVENOUS | 0 refills | Status: CN
Start: 2017-04-18 — End: ?

## 2017-04-18 MED ORDER — ONDANSETRON HCL 8 MG PO TAB
16 mg | Freq: Once | ORAL | 0 refills | Status: CN
Start: 2017-04-18 — End: ?

## 2017-04-18 MED ORDER — OXALIPLATIN IVPB
85 mg/m2 | Freq: Once | INTRAVENOUS | 0 refills | Status: CN
Start: 2017-04-18 — End: ?

## 2017-04-18 MED ORDER — FLUOROURACIL IV AMB PUMP
2400 mg/m2 | Freq: Once | INTRAVENOUS | 0 refills | Status: CN
Start: 2017-04-18 — End: ?

## 2017-04-18 MED ORDER — LEUCOVORIN IVPB
400 mg/m2 | Freq: Once | INTRAVENOUS | 0 refills | Status: CN
Start: 2017-04-18 — End: ?

## 2017-04-18 MED ORDER — DEXAMETHASONE 6 MG PO TAB
12 mg | Freq: Once | ORAL | 0 refills | Status: CN
Start: 2017-04-18 — End: ?

## 2017-04-18 MED ORDER — MICONAZOLE NITRATE 2 % TP CREA
Freq: Every day | TOPICAL | 1 refills | 15.00000 days | Status: AC | PRN
Start: 2017-04-18 — End: 2017-10-08

## 2017-04-19 ENCOUNTER — Encounter: Admit: 2017-04-19 | Discharge: 2017-04-19 | Payer: BC Managed Care – PPO

## 2017-04-19 DIAGNOSIS — C181 Malignant neoplasm of appendix: Principal | ICD-10-CM

## 2017-04-19 LAB — IRON + BINDING CAPACITY + %SAT+ FERRITIN
Lab: 132 ng/mL (ref 10–200)
Lab: 20 ug/dL — ABNORMAL LOW (ref 50–160)
Lab: 384 ug/dL — ABNORMAL HIGH (ref 270–380)
Lab: 5 % — ABNORMAL LOW (ref 28–42)

## 2017-04-19 MED ORDER — FERROUS SULFATE 325 MG (65 MG IRON) PO TAB
ORAL_TABLET | Freq: Two times a day (BID) | 3 refills | Status: AC
Start: 2017-04-19 — End: 2017-07-16

## 2017-04-22 ENCOUNTER — Encounter: Admit: 2017-04-22 | Discharge: 2017-04-22 | Payer: BC Managed Care – PPO

## 2017-04-22 DIAGNOSIS — C181 Malignant neoplasm of appendix: Principal | ICD-10-CM

## 2017-04-22 DIAGNOSIS — C801 Malignant (primary) neoplasm, unspecified: ICD-10-CM

## 2017-04-23 ENCOUNTER — Encounter: Admit: 2017-04-23 | Discharge: 2017-04-23 | Payer: BC Managed Care – PPO

## 2017-04-23 ENCOUNTER — Ambulatory Visit: Admit: 2017-04-23 | Discharge: 2017-04-23 | Payer: BC Managed Care – PPO

## 2017-04-23 DIAGNOSIS — C482 Malignant neoplasm of peritoneum, unspecified: Principal | ICD-10-CM

## 2017-04-23 DIAGNOSIS — Z85038 Personal history of other malignant neoplasm of large intestine: ICD-10-CM

## 2017-04-23 MED ORDER — BUPIVACAINE 0.25 % (2.5 MG/ML) IJ SOLN
0 refills | Status: DC
Start: 2017-04-23 — End: 2017-04-23
  Administered 2017-04-23: 15:00:00 5 mL via INTRAMUSCULAR

## 2017-04-23 MED ORDER — HEPARIN 10000UNITS NS 100ML IRR(OR)
Freq: Once | 0 refills | Status: DC
Start: 2017-04-23 — End: 2017-04-23

## 2017-04-23 MED ORDER — LACTATED RINGERS IV SOLP
1000 mL | INTRAVENOUS | 0 refills | Status: DC
Start: 2017-04-23 — End: 2017-04-23
  Administered 2017-04-23: 13:00:00 1000 mL via INTRAVENOUS
  Administered 2017-04-23: 14:00:00 1000.000 mL via INTRAVENOUS

## 2017-04-23 MED ORDER — MIDAZOLAM 1 MG/ML IJ SOLN
INTRAVENOUS | 0 refills | Status: DC
Start: 2017-04-23 — End: 2017-04-23
  Administered 2017-04-23: 14:00:00 1 mg via INTRAVENOUS

## 2017-04-23 MED ORDER — LIDOCAINE HCL 10 MG/ML (1 %) IJ SOLN
0 refills | Status: DC
Start: 2017-04-23 — End: 2017-04-23
  Administered 2017-04-23: 15:00:00 5 mL via INTRAMUSCULAR

## 2017-04-23 MED ORDER — CEFAZOLIN 1 GRAM IJ SOLR
0 refills | Status: DC
Start: 2017-04-23 — End: 2017-04-23
  Administered 2017-04-23: 14:00:00 2 g via INTRAVENOUS

## 2017-04-23 MED ORDER — OXYCODONE 5 MG PO TAB
5-10 mg | Freq: Once | ORAL | 0 refills | Status: DC | PRN
Start: 2017-04-23 — End: 2017-04-23

## 2017-04-23 MED ORDER — HALOPERIDOL LACTATE 5 MG/ML IJ SOLN
0 refills | Status: DC
Start: 2017-04-23 — End: 2017-04-23
  Administered 2017-04-23: 14:00:00 1 mg via INTRAVENOUS

## 2017-04-23 MED ORDER — FENTANYL CITRATE (PF) 50 MCG/ML IJ SOLN
0 refills | Status: DC
Start: 2017-04-23 — End: 2017-04-23
  Administered 2017-04-23: 14:00:00 50 ug via INTRAVENOUS

## 2017-04-23 MED ORDER — HEPARIN 10000UNITS NS 100ML IRR(OR)
0 refills | Status: DC
Start: 2017-04-23 — End: 2017-04-23
  Administered 2017-04-23 (×2): 30 mL

## 2017-04-23 MED ORDER — DEXTRAN 70-HYPROMELLOSE (PF) 0.1-0.3 % OP DPET
0 refills | Status: DC
Start: 2017-04-23 — End: 2017-04-23
  Administered 2017-04-23: 14:00:00 2 [drp] via OPHTHALMIC

## 2017-04-23 MED ORDER — PROMETHAZINE 25 MG/ML IJ SOLN
6.25 mg | INTRAVENOUS | 0 refills | Status: DC | PRN
Start: 2017-04-23 — End: 2017-04-23

## 2017-04-23 MED ORDER — FENTANYL CITRATE (PF) 50 MCG/ML IJ SOLN
50 ug | INTRAVENOUS | 0 refills | Status: CN | PRN
Start: 2017-04-23 — End: ?

## 2017-04-23 MED ORDER — ONDANSETRON HCL (PF) 4 MG/2 ML IJ SOLN
4 mg | Freq: Once | INTRAVENOUS | 0 refills | Status: CN | PRN
Start: 2017-04-23 — End: ?

## 2017-04-23 MED ORDER — OXYCODONE 5 MG PO TAB
5-10 mg | Freq: Once | ORAL | 0 refills | Status: CN | PRN
Start: 2017-04-23 — End: ?

## 2017-04-23 MED ORDER — ONDANSETRON HCL (PF) 4 MG/2 ML IJ SOLN
INTRAVENOUS | 0 refills | Status: DC
Start: 2017-04-23 — End: 2017-04-23
  Administered 2017-04-23: 15:00:00 4 mg via INTRAVENOUS

## 2017-04-23 MED ORDER — MEPERIDINE (PF) 25 MG/ML IJ SYRG
12.5 mg | INTRAVENOUS | 0 refills | Status: DC | PRN
Start: 2017-04-23 — End: 2017-04-23

## 2017-04-23 MED ORDER — LIDOCAINE (PF) 200 MG/10 ML (2 %) IJ SYRG
0 refills | Status: DC
Start: 2017-04-23 — End: 2017-04-23
  Administered 2017-04-23: 14:00:00 60 mg via INTRAVENOUS

## 2017-04-23 MED ORDER — PROPOFOL INJ 10 MG/ML IV VIAL
0 refills | Status: DC
Start: 2017-04-23 — End: 2017-04-23
  Administered 2017-04-23: 14:00:00 100 mg via INTRAVENOUS

## 2017-04-23 MED ORDER — LIDOCAINE (PF) 10 MG/ML (1 %) IJ SOLN
.1-2 mL | INTRAMUSCULAR | 0 refills | Status: DC | PRN
Start: 2017-04-23 — End: 2017-04-23

## 2017-04-23 MED ORDER — PHENYLEPHRINE IN 0.9% NACL(PF) 1 MG/10 ML (100 MCG/ML) IV SYRG
INTRAVENOUS | 0 refills | Status: DC
Start: 2017-04-23 — End: 2017-04-23
  Administered 2017-04-23: 14:00:00 100 ug via INTRAVENOUS
  Administered 2017-04-23: 15:00:00 150 ug via INTRAVENOUS
  Administered 2017-04-23 (×3): 100 ug via INTRAVENOUS
  Administered 2017-04-23: 14:00:00 150 ug via INTRAVENOUS
  Administered 2017-04-23 (×2): 100 ug via INTRAVENOUS
  Administered 2017-04-23: 15:00:00 150 ug via INTRAVENOUS
  Administered 2017-04-23 (×3): 100 ug via INTRAVENOUS

## 2017-04-23 MED ORDER — DEXAMETHASONE SODIUM PHOSPHATE 4 MG/ML IJ SOLN
4 mg | Freq: Once | INTRAVENOUS | 0 refills | Status: DC | PRN
Start: 2017-04-23 — End: 2017-04-23

## 2017-04-23 MED ORDER — FENTANYL CITRATE (PF) 50 MCG/ML IJ SOLN
25 ug | INTRAVENOUS | 0 refills | Status: DC | PRN
Start: 2017-04-23 — End: 2017-04-23

## 2017-04-25 ENCOUNTER — Encounter: Admit: 2017-04-25 | Discharge: 2017-04-25 | Payer: BC Managed Care – PPO

## 2017-04-25 MED ORDER — LIDOCAINE-PRILOCAINE 2.5-2.5 % TP CREA
0 refills | Status: AC
Start: 2017-04-25 — End: 2018-12-16

## 2017-04-28 ENCOUNTER — Encounter: Admit: 2017-04-28 | Discharge: 2017-04-28 | Payer: BC Managed Care – PPO

## 2017-04-28 DIAGNOSIS — C801 Malignant (primary) neoplasm, unspecified: ICD-10-CM

## 2017-04-28 DIAGNOSIS — C181 Malignant neoplasm of appendix: Principal | ICD-10-CM

## 2017-04-30 ENCOUNTER — Encounter: Admit: 2017-04-30 | Discharge: 2017-04-30 | Payer: BC Managed Care – PPO

## 2017-04-30 DIAGNOSIS — C787 Secondary malignant neoplasm of liver and intrahepatic bile duct: ICD-10-CM

## 2017-04-30 DIAGNOSIS — C801 Malignant (primary) neoplasm, unspecified: ICD-10-CM

## 2017-04-30 DIAGNOSIS — C7989 Secondary malignant neoplasm of other specified sites: ICD-10-CM

## 2017-04-30 DIAGNOSIS — C181 Malignant neoplasm of appendix: Principal | ICD-10-CM

## 2017-04-30 DIAGNOSIS — C7982 Secondary malignant neoplasm of genital organs: ICD-10-CM

## 2017-04-30 DIAGNOSIS — C786 Secondary malignant neoplasm of retroperitoneum and peritoneum: ICD-10-CM

## 2017-04-30 DIAGNOSIS — K5900 Constipation, unspecified: ICD-10-CM

## 2017-04-30 LAB — COMPREHENSIVE METABOLIC PANEL
Lab: 0.3 mg/dL (ref 0.3–1.2)
Lab: 0.8 mg/dL — ABNORMAL LOW (ref 0.4–1.00)
Lab: 102 U/L (ref 25–110)
Lab: 111 mg/dL — ABNORMAL HIGH (ref 70–100)
Lab: 17 U/L (ref 7–40)
Lab: 26 MMOL/L (ref 21–30)
Lab: 3.6 g/dL — ABNORMAL HIGH (ref 3.5–5.0)
Lab: 4.4 MMOL/L — ABNORMAL LOW (ref 3.5–5.1)
Lab: 7 K/UL (ref 3–12)
Lab: 7.7 g/dL — ABNORMAL LOW (ref 6.0–8.0)
Lab: 9.6 mg/dL (ref 8.5–10.6)
Lab: >60 mL/min (ref >60)
Lab: >60 mL/min (ref >60)

## 2017-04-30 LAB — CBC AND DIFF
Lab: 0.5 10*3/uL (ref 0–0.80)
Lab: 30 % — ABNORMAL LOW (ref 36–45)
Lab: 31 g/dL — ABNORMAL LOW (ref 32.0–36.0)
Lab: 4 M/UL (ref 4.0–5.0)
Lab: 429 K/UL — ABNORMAL HIGH (ref 150–400)
Lab: 6.1 10*3/uL (ref 4.5–11.0)
Lab: 74 FL — ABNORMAL LOW (ref 80–100)

## 2017-04-30 MED ORDER — OXALIPLATIN IVPB
85 mg/m2 | Freq: Once | INTRAVENOUS | 0 refills | Status: CN
Start: 2017-04-30 — End: ?

## 2017-04-30 MED ORDER — LEUCOVORIN IVPB
400 mg/m2 | Freq: Once | INTRAVENOUS | 0 refills | Status: CN
Start: 2017-04-30 — End: ?

## 2017-04-30 MED ORDER — ONDANSETRON HCL 8 MG PO TAB
16 mg | Freq: Once | ORAL | 0 refills | Status: CN
Start: 2017-04-30 — End: ?

## 2017-04-30 MED ORDER — FLUOROURACIL IV AMB PUMP
2400 mg/m2 | Freq: Once | INTRAVENOUS | 0 refills | Status: CN
Start: 2017-04-30 — End: ?

## 2017-04-30 MED ORDER — DEXAMETHASONE 6 MG PO TAB
12 mg | Freq: Once | ORAL | 0 refills | Status: CN
Start: 2017-04-30 — End: ?

## 2017-04-30 MED ORDER — DOXYCYCLINE HYCLATE 100 MG PO TAB
100 mg | ORAL_TABLET | Freq: Two times a day (BID) | ORAL | 0 refills | 8.00000 days | Status: AC
Start: 2017-04-30 — End: ?

## 2017-05-01 ENCOUNTER — Encounter: Admit: 2017-05-01 | Discharge: 2017-05-01 | Payer: BC Managed Care – PPO

## 2017-05-01 DIAGNOSIS — C786 Secondary malignant neoplasm of retroperitoneum and peritoneum: ICD-10-CM

## 2017-05-01 DIAGNOSIS — C7989 Secondary malignant neoplasm of other specified sites: ICD-10-CM

## 2017-05-01 DIAGNOSIS — C801 Malignant (primary) neoplasm, unspecified: ICD-10-CM

## 2017-05-01 DIAGNOSIS — C787 Secondary malignant neoplasm of liver and intrahepatic bile duct: ICD-10-CM

## 2017-05-01 DIAGNOSIS — C7982 Secondary malignant neoplasm of genital organs: ICD-10-CM

## 2017-05-01 DIAGNOSIS — C181 Malignant neoplasm of appendix: Principal | ICD-10-CM

## 2017-05-01 MED ORDER — LORAZEPAM 0.5 MG PO TAB
ORAL_TABLET | Freq: Four times a day (QID) | ORAL | 0 refills | 12.00000 days | Status: AC | PRN
Start: 2017-05-01 — End: 2018-12-16
  Filled 2017-05-01 (×2): qty 30, 6d supply, fill #1

## 2017-05-01 MED ORDER — ONDANSETRON HCL 8 MG PO TAB
ORAL_TABLET | Freq: Three times a day (TID) | ORAL | 3 refills | 8.00000 days | Status: AC | PRN
Start: 2017-05-01 — End: 2017-10-08
  Filled 2017-05-01 (×2): qty 45, 15d supply, fill #1

## 2017-05-01 MED ORDER — LEUCOVORIN IVPB
400 mg/m2 | Freq: Once | INTRAVENOUS | 0 refills | Status: CP
Start: 2017-05-01 — End: ?
  Administered 2017-05-01 (×2): 660 mg via INTRAVENOUS

## 2017-05-01 MED ORDER — ONDANSETRON HCL 8 MG PO TAB
16 mg | Freq: Once | ORAL | 0 refills | Status: CP
Start: 2017-05-01 — End: ?
  Administered 2017-05-01: 13:00:00 16 mg via ORAL

## 2017-05-01 MED ORDER — FLUOROURACIL 5 GRAM/100 ML IV SOLN
400 mg/m2 | Freq: Once | INTRAVENOUS | 0 refills | Status: CP
Start: 2017-05-01 — End: ?
  Administered 2017-05-01: 17:00:00 660 mg via INTRAVENOUS

## 2017-05-01 MED ORDER — FLUOROURACIL IV AMB PUMP
2400 mg/m2 | Freq: Once | INTRAVENOUS | 0 refills | Status: CP
Start: 2017-05-01 — End: ?
  Administered 2017-05-01 (×2): 3960 mg via INTRAVENOUS

## 2017-05-01 MED ORDER — DEXAMETHASONE 6 MG PO TAB
12 mg | Freq: Once | ORAL | 0 refills | Status: CP
Start: 2017-05-01 — End: ?
  Administered 2017-05-01: 13:00:00 12 mg via ORAL

## 2017-05-01 MED ORDER — DEXAMETHASONE 4 MG PO TAB
ORAL_TABLET | INTRAMUSCULAR | 3 refills | 14.00000 days | Status: AC
Start: 2017-05-01 — End: 2017-08-29
  Filled 2017-05-01 (×2): qty 30, 15d supply, fill #1

## 2017-05-01 MED ORDER — OXALIPLATIN IVPB
85 mg/m2 | Freq: Once | INTRAVENOUS | 0 refills | Status: CP
Start: 2017-05-01 — End: ?
  Administered 2017-05-01 (×3): 140.25 mg via INTRAVENOUS

## 2017-05-03 ENCOUNTER — Encounter: Admit: 2017-05-03 | Discharge: 2017-05-03 | Payer: BC Managed Care – PPO

## 2017-05-03 DIAGNOSIS — C181 Malignant neoplasm of appendix: Principal | ICD-10-CM

## 2017-05-03 DIAGNOSIS — C801 Malignant (primary) neoplasm, unspecified: ICD-10-CM

## 2017-05-03 DIAGNOSIS — C786 Secondary malignant neoplasm of retroperitoneum and peritoneum: ICD-10-CM

## 2017-05-03 MED ORDER — SODIUM CHLORIDE 0.9 % IV SOLP
1000 mL | Freq: Once | INTRAVENOUS | 0 refills | Status: CP
Start: 2017-05-03 — End: ?
  Administered 2017-05-03: 15:00:00 1000 mL via INTRAVENOUS

## 2017-05-03 MED ORDER — HEPARIN, PORCINE (PF) 100 UNIT/ML IV SYRG
500 [IU] | Freq: Once | INTRAVENOUS | 0 refills | Status: CP
Start: 2017-05-03 — End: ?
  Administered 2017-05-03: 16:00:00 500 [IU] via INTRAVENOUS

## 2017-05-09 ENCOUNTER — Encounter: Admit: 2017-05-09 | Discharge: 2017-05-09 | Payer: BC Managed Care – PPO

## 2017-05-09 DIAGNOSIS — K5900 Constipation, unspecified: ICD-10-CM

## 2017-05-09 DIAGNOSIS — C787 Secondary malignant neoplasm of liver and intrahepatic bile duct: ICD-10-CM

## 2017-05-09 DIAGNOSIS — C786 Secondary malignant neoplasm of retroperitoneum and peritoneum: ICD-10-CM

## 2017-05-09 DIAGNOSIS — C801 Malignant (primary) neoplasm, unspecified: ICD-10-CM

## 2017-05-09 DIAGNOSIS — C181 Malignant neoplasm of appendix: Principal | ICD-10-CM

## 2017-05-09 DIAGNOSIS — C7989 Secondary malignant neoplasm of other specified sites: ICD-10-CM

## 2017-05-09 DIAGNOSIS — C7982 Secondary malignant neoplasm of genital organs: ICD-10-CM

## 2017-05-09 LAB — COMPREHENSIVE METABOLIC PANEL
Lab: 0.5 mg/dL (ref 0.3–1.2)
Lab: 0.8 mg/dL (ref 0.4–1.00)
Lab: 101 MMOL/L (ref 98–110)
Lab: 109 mg/dL — ABNORMAL HIGH (ref 70–100)
Lab: 133 MMOL/L — ABNORMAL LOW (ref 137–147)
Lab: 19 mg/dL (ref 7–25)
Lab: 3.8 MMOL/L (ref 3.5–5.1)
Lab: 3.9 g/dL (ref 3.5–5.0)
Lab: 7.6 g/dL (ref 6.0–8.0)
Lab: 9.6 mg/dL (ref 8.5–10.6)

## 2017-05-09 LAB — CBC AND DIFF
Lab: 0 10*3/uL (ref 0–0.20)
Lab: 0.3 10*3/uL (ref 0–0.45)
Lab: 0.6 10*3/uL (ref 0–0.80)
Lab: 1 % (ref 0–2)
Lab: 1.5 10*3/uL (ref 1.0–4.8)
Lab: 10 g/dL — ABNORMAL LOW (ref 12.0–15.0)
Lab: 16 % — ABNORMAL HIGH (ref 11–15)
Lab: 19 % — ABNORMAL LOW (ref 24–44)
Lab: 23 pg — ABNORMAL LOW (ref >60)
Lab: 31 g/dL — ABNORMAL LOW (ref >60)
Lab: 32 % — ABNORMAL LOW (ref 36–45)
Lab: 373 10*3/uL (ref 150–400)
Lab: 4 % (ref 0–5)
Lab: 4.3 M/UL (ref 4.0–5.0)
Lab: 5.4 10*3/uL (ref 1.8–7.0)
Lab: 6.8 FL — ABNORMAL LOW (ref 7–11)
Lab: 69 % (ref 41–77)
Lab: 7 % (ref 4–12)
Lab: 7.9 K/UL (ref 4.5–11.0)
Lab: 75 FL — ABNORMAL LOW (ref 80–100)

## 2017-05-09 MED ORDER — HEPARIN, PORCINE (PF) 100 UNIT/ML IV SYRG
500 [IU] | Freq: Once | INTRAVENOUS | 0 refills | Status: DC
Start: 2017-05-09 — End: 2017-05-09

## 2017-05-10 ENCOUNTER — Encounter: Admit: 2017-05-10 | Discharge: 2017-05-10 | Payer: BC Managed Care – PPO

## 2017-05-14 ENCOUNTER — Encounter: Admit: 2017-05-14 | Discharge: 2017-05-14 | Payer: BC Managed Care – PPO

## 2017-05-14 DIAGNOSIS — C786 Secondary malignant neoplasm of retroperitoneum and peritoneum: Secondary | ICD-10-CM

## 2017-05-14 DIAGNOSIS — C801 Malignant (primary) neoplasm, unspecified: ICD-10-CM

## 2017-05-14 DIAGNOSIS — C7982 Secondary malignant neoplasm of genital organs: ICD-10-CM

## 2017-05-14 DIAGNOSIS — C7989 Secondary malignant neoplasm of other specified sites: ICD-10-CM

## 2017-05-14 DIAGNOSIS — C181 Malignant neoplasm of appendix: Principal | ICD-10-CM

## 2017-05-14 DIAGNOSIS — C787 Secondary malignant neoplasm of liver and intrahepatic bile duct: ICD-10-CM

## 2017-05-14 DIAGNOSIS — K5900 Constipation, unspecified: ICD-10-CM

## 2017-05-14 DIAGNOSIS — D509 Iron deficiency anemia, unspecified: ICD-10-CM

## 2017-05-14 LAB — COMPREHENSIVE METABOLIC PANEL
Lab: 100 MMOL/L — ABNORMAL LOW (ref 98–110)
Lab: 130 MMOL/L — ABNORMAL LOW (ref 137–147)
Lab: 4.4 MMOL/L — ABNORMAL LOW (ref 3.5–5.1)
Lab: 98 mg/dL — ABNORMAL LOW (ref 70–100)

## 2017-05-14 LAB — CBC AND DIFF: Lab: 5.1 10*3/uL (ref 4.5–11.0)

## 2017-05-14 LAB — CA19.9: Lab: <1 U/mL (ref <35)

## 2017-05-14 LAB — CA125: Lab: 92 U/mL — ABNORMAL HIGH (ref <35)

## 2017-05-14 LAB — CEA(CARCINOEMBRYONIC AG): Lab: 172 ng/mL — ABNORMAL HIGH (ref <3.0)

## 2017-05-14 MED ORDER — FERRIC CARBOXYMALTOSE IVPB
750 mg | Freq: Once | INTRAVENOUS | 0 refills | Status: CN
Start: 2017-05-14 — End: ?

## 2017-05-14 MED ORDER — FLUOROURACIL 5 GRAM/100 ML IV SOLN
400 mg/m2 | Freq: Once | INTRAVENOUS | 0 refills | Status: CP
Start: 2017-05-14 — End: ?
  Administered 2017-05-14: 19:00:00 660 mg via INTRAVENOUS

## 2017-05-14 MED ORDER — ONDANSETRON HCL 8 MG PO TAB
16 mg | Freq: Once | ORAL | 0 refills | Status: CP
Start: 2017-05-14 — End: ?
  Administered 2017-05-14: 16:00:00 16 mg via ORAL

## 2017-05-14 MED ORDER — OXALIPLATIN IVPB
85 mg/m2 | Freq: Once | INTRAVENOUS | 0 refills | Status: CP
Start: 2017-05-14 — End: ?
  Administered 2017-05-14 (×3): 140.25 mg via INTRAVENOUS

## 2017-05-14 MED ORDER — DEXAMETHASONE 6 MG PO TAB
12 mg | Freq: Once | ORAL | 0 refills | Status: CP
Start: 2017-05-14 — End: ?
  Administered 2017-05-14: 16:00:00 12 mg via ORAL

## 2017-05-14 MED ORDER — FLUOROURACIL IV AMB PUMP
2400 mg/m2 | Freq: Once | INTRAVENOUS | 0 refills | Status: CP
Start: 2017-05-14 — End: ?
  Administered 2017-05-14 (×2): 3960 mg via INTRAVENOUS

## 2017-05-14 MED ORDER — LEUCOVORIN IVPB
400 mg/m2 | Freq: Once | INTRAVENOUS | 0 refills | Status: CP
Start: 2017-05-14 — End: ?
  Administered 2017-05-14 (×2): 660 mg via INTRAVENOUS

## 2017-05-15 ENCOUNTER — Encounter: Admit: 2017-05-15 | Discharge: 2017-05-15 | Payer: BC Managed Care – PPO

## 2017-05-15 DIAGNOSIS — C181 Malignant neoplasm of appendix: Principal | ICD-10-CM

## 2017-05-15 DIAGNOSIS — C801 Malignant (primary) neoplasm, unspecified: ICD-10-CM

## 2017-05-16 ENCOUNTER — Encounter: Admit: 2017-05-16 | Discharge: 2017-05-16 | Payer: BC Managed Care – PPO

## 2017-05-16 DIAGNOSIS — C7989 Secondary malignant neoplasm of other specified sites: ICD-10-CM

## 2017-05-16 DIAGNOSIS — C181 Malignant neoplasm of appendix: Principal | ICD-10-CM

## 2017-05-16 DIAGNOSIS — C7982 Secondary malignant neoplasm of genital organs: ICD-10-CM

## 2017-05-16 DIAGNOSIS — C787 Secondary malignant neoplasm of liver and intrahepatic bile duct: ICD-10-CM

## 2017-05-16 DIAGNOSIS — D500 Iron deficiency anemia secondary to blood loss (chronic): ICD-10-CM

## 2017-05-16 DIAGNOSIS — D630 Anemia in neoplastic disease: ICD-10-CM

## 2017-05-16 DIAGNOSIS — C786 Secondary malignant neoplasm of retroperitoneum and peritoneum: ICD-10-CM

## 2017-05-16 DIAGNOSIS — C801 Malignant (primary) neoplasm, unspecified: ICD-10-CM

## 2017-05-16 MED ORDER — HEPARIN, PORCINE (PF) 100 UNIT/ML IV SYRG
500 [IU] | Freq: Once | INTRAVENOUS | 0 refills | Status: CP
Start: 2017-05-16 — End: ?
  Administered 2017-05-16: 19:00:00 500 [IU] via INTRAVENOUS

## 2017-05-16 MED ORDER — SODIUM CHLORIDE 0.9 % IV SOLP
1000 mL | Freq: Once | INTRAVENOUS | 0 refills | Status: CP
Start: 2017-05-16 — End: ?
  Administered 2017-05-16: 18:00:00 1000 mL via INTRAVENOUS

## 2017-05-16 MED ORDER — FERRIC CARBOXYMALTOSE IVPB
750 mg | Freq: Once | INTRAVENOUS | 0 refills | Status: CP
Start: 2017-05-16 — End: ?
  Administered 2017-05-16 (×2): 750 mg via INTRAVENOUS

## 2017-05-23 ENCOUNTER — Encounter: Admit: 2017-05-23 | Discharge: 2017-05-23 | Payer: BC Managed Care – PPO

## 2017-05-23 MED ORDER — SODIUM CHLORIDE 0.9 % IV SOLP
1000 mL | Freq: Once | INTRAVENOUS | 0 refills | Status: CN
Start: 2017-05-23 — End: ?

## 2017-05-24 ENCOUNTER — Encounter: Admit: 2017-05-24 | Discharge: 2017-05-24 | Payer: BC Managed Care – PPO

## 2017-05-24 DIAGNOSIS — Z809 Family history of malignant neoplasm, unspecified: Principal | ICD-10-CM

## 2017-05-24 DIAGNOSIS — C181 Malignant neoplasm of appendix: Principal | ICD-10-CM

## 2017-05-24 MED ORDER — SODIUM CHLORIDE 0.9 % IV SOLP
1000 mL | Freq: Once | INTRAVENOUS | 0 refills | Status: CP
Start: 2017-05-24 — End: ?
  Administered 2017-05-24: 15:00:00 1000 mL via INTRAVENOUS

## 2017-05-24 MED ORDER — HEPARIN, PORCINE (PF) 100 UNIT/ML IV SYRG
500 [IU] | Freq: Once | INTRAVENOUS | 0 refills | Status: CP
Start: 2017-05-24 — End: ?
  Administered 2017-05-24: 17:00:00 500 [IU] via INTRAVENOUS

## 2017-05-28 ENCOUNTER — Encounter: Admit: 2017-05-28 | Discharge: 2017-05-28 | Payer: BC Managed Care – PPO

## 2017-05-28 DIAGNOSIS — C801 Malignant (primary) neoplasm, unspecified: ICD-10-CM

## 2017-05-28 DIAGNOSIS — C7989 Secondary malignant neoplasm of other specified sites: ICD-10-CM

## 2017-05-28 DIAGNOSIS — C181 Malignant neoplasm of appendix: Principal | ICD-10-CM

## 2017-05-28 DIAGNOSIS — D509 Iron deficiency anemia, unspecified: ICD-10-CM

## 2017-05-28 DIAGNOSIS — K5900 Constipation, unspecified: ICD-10-CM

## 2017-05-28 DIAGNOSIS — C787 Secondary malignant neoplasm of liver and intrahepatic bile duct: ICD-10-CM

## 2017-05-28 DIAGNOSIS — C786 Secondary malignant neoplasm of retroperitoneum and peritoneum: Secondary | ICD-10-CM

## 2017-05-28 DIAGNOSIS — C7982 Secondary malignant neoplasm of genital organs: ICD-10-CM

## 2017-05-28 LAB — COMPREHENSIVE METABOLIC PANEL
Lab: 11 mg/dL — ABNORMAL LOW (ref 7–25)
Lab: 123 mg/dL — ABNORMAL HIGH (ref >60)
Lab: 134 MMOL/L — ABNORMAL LOW (ref 137–147)
Lab: 3.6 MMOL/L — ABNORMAL LOW (ref 3.5–5.1)

## 2017-05-28 LAB — CEA(CARCINOEMBRYONIC AG): Lab: 250 ng/mL — ABNORMAL HIGH (ref <3.0)

## 2017-05-28 LAB — CA19.9: Lab: <1 U/mL — ABNORMAL HIGH (ref <35)

## 2017-05-28 LAB — CA125: Lab: 99 U/mL — ABNORMAL HIGH (ref <35)

## 2017-05-28 LAB — CBC AND DIFF: Lab: 3.6 10*3/uL — ABNORMAL LOW (ref 4.5–11.0)

## 2017-05-28 MED ORDER — OXALIPLATIN IVPB
85 mg/m2 | Freq: Once | INTRAVENOUS | 0 refills | Status: CP
Start: 2017-05-28 — End: ?
  Administered 2017-05-28 (×3): 140.25 mg via INTRAVENOUS

## 2017-05-28 MED ORDER — DEXAMETHASONE 6 MG PO TAB
12 mg | Freq: Once | ORAL | 0 refills | Status: CP
Start: 2017-05-28 — End: ?
  Administered 2017-05-28: 15:00:00 12 mg via ORAL

## 2017-05-28 MED ORDER — LEUCOVORIN IVPB
400 mg/m2 | Freq: Once | INTRAVENOUS | 0 refills | Status: CP
Start: 2017-05-28 — End: ?
  Administered 2017-05-28 (×2): 660 mg via INTRAVENOUS

## 2017-05-28 MED ORDER — ALTEPLASE 2 MG IK SOLR
2 mg | Freq: Once | INTRAMUSCULAR | 0 refills | Status: CP
Start: 2017-05-28 — End: ?
  Administered 2017-05-28: 13:00:00 2 mg via INTRAMUSCULAR

## 2017-05-28 MED ORDER — FLUOROURACIL 5 GRAM/100 ML IV SOLN
400 mg/m2 | Freq: Once | INTRAVENOUS | 0 refills | Status: CP
Start: 2017-05-28 — End: ?
  Administered 2017-05-28: 19:00:00 660 mg via INTRAVENOUS

## 2017-05-28 MED ORDER — ONDANSETRON HCL 8 MG PO TAB
16 mg | Freq: Once | ORAL | 0 refills | Status: CP
Start: 2017-05-28 — End: ?
  Administered 2017-05-28: 15:00:00 16 mg via ORAL

## 2017-05-28 MED ORDER — FLUOROURACIL IV AMB PUMP
2400 mg/m2 | Freq: Once | INTRAVENOUS | 0 refills | Status: CP
Start: 2017-05-28 — End: ?
  Administered 2017-05-28 (×2): 3960 mg via INTRAVENOUS

## 2017-05-30 ENCOUNTER — Encounter: Admit: 2017-05-30 | Discharge: 2017-05-30 | Payer: BC Managed Care – PPO

## 2017-05-30 DIAGNOSIS — C181 Malignant neoplasm of appendix: Principal | ICD-10-CM

## 2017-05-30 DIAGNOSIS — C7989 Secondary malignant neoplasm of other specified sites: ICD-10-CM

## 2017-05-30 DIAGNOSIS — C786 Secondary malignant neoplasm of retroperitoneum and peritoneum: ICD-10-CM

## 2017-05-30 DIAGNOSIS — C7982 Secondary malignant neoplasm of genital organs: ICD-10-CM

## 2017-05-30 DIAGNOSIS — C787 Secondary malignant neoplasm of liver and intrahepatic bile duct: ICD-10-CM

## 2017-05-30 DIAGNOSIS — D500 Iron deficiency anemia secondary to blood loss (chronic): ICD-10-CM

## 2017-05-30 DIAGNOSIS — C801 Malignant (primary) neoplasm, unspecified: ICD-10-CM

## 2017-05-30 MED ORDER — HEPARIN, PORCINE (PF) 100 UNIT/ML IV SYRG
500 [IU] | Freq: Once | INTRAVENOUS | 0 refills | Status: CP
Start: 2017-05-30 — End: ?
  Administered 2017-05-30: 19:00:00 500 [IU] via INTRAVENOUS

## 2017-05-30 MED ORDER — SODIUM CHLORIDE 0.9 % IV SOLP
1000 mL | Freq: Once | INTRAVENOUS | 0 refills | Status: CP
Start: 2017-05-30 — End: ?
  Administered 2017-05-30: 18:00:00 1000 mL via INTRAVENOUS

## 2017-05-30 MED ORDER — FERRIC CARBOXYMALTOSE IVPB
750 mg | Freq: Once | INTRAVENOUS | 0 refills | Status: CP
Start: 2017-05-30 — End: ?
  Administered 2017-05-30 (×2): 750 mg via INTRAVENOUS

## 2017-06-06 ENCOUNTER — Encounter: Admit: 2017-06-06 | Discharge: 2017-06-06 | Payer: BC Managed Care – PPO

## 2017-06-11 ENCOUNTER — Encounter: Admit: 2017-06-11 | Discharge: 2017-06-11 | Payer: BC Managed Care – PPO

## 2017-06-11 DIAGNOSIS — C7989 Secondary malignant neoplasm of other specified sites: ICD-10-CM

## 2017-06-11 DIAGNOSIS — C181 Malignant neoplasm of appendix: Principal | ICD-10-CM

## 2017-06-11 DIAGNOSIS — C7982 Secondary malignant neoplasm of genital organs: ICD-10-CM

## 2017-06-11 DIAGNOSIS — C787 Secondary malignant neoplasm of liver and intrahepatic bile duct: ICD-10-CM

## 2017-06-11 DIAGNOSIS — C786 Secondary malignant neoplasm of retroperitoneum and peritoneum: ICD-10-CM

## 2017-06-11 DIAGNOSIS — C801 Malignant (primary) neoplasm, unspecified: ICD-10-CM

## 2017-06-11 LAB — COMPREHENSIVE METABOLIC PANEL
Lab: 109 mg/dL — ABNORMAL HIGH (ref 70–100)
Lab: 129 MMOL/L — ABNORMAL LOW (ref 137–147)
Lab: 3.9 MMOL/L — ABNORMAL LOW (ref 3.5–5.1)
Lab: 8.7 mg/dL — ABNORMAL HIGH (ref 8.5–10.6)
Lab: 99 MMOL/L — ABNORMAL LOW (ref 98–110)

## 2017-06-11 LAB — CBC AND DIFF: Lab: 3.3 10*3/uL — ABNORMAL LOW (ref 4.5–11.0)

## 2017-06-11 LAB — CEA(CARCINOEMBRYONIC AG): Lab: 212 ng/mL — ABNORMAL HIGH (ref <3.0)

## 2017-06-11 LAB — CA125: Lab: 83 U/mL — ABNORMAL HIGH (ref <35)

## 2017-06-11 LAB — CA19.9: Lab: <1 U/mL (ref <35)

## 2017-06-11 MED ORDER — ONDANSETRON 8 MG PO TBDI
ORAL_TABLET | Freq: Two times a day (BID) | ORAL | 3 refills | 8.00000 days | Status: AC
Start: 2017-06-11 — End: 2018-12-03

## 2017-06-11 MED ORDER — ONDANSETRON HCL 8 MG PO TAB
16 mg | Freq: Once | ORAL | 0 refills | Status: CP
Start: 2017-06-11 — End: ?
  Administered 2017-06-11: 16:00:00 16 mg via ORAL

## 2017-06-11 MED ORDER — OXALIPLATIN IVPB
85 mg/m2 | Freq: Once | INTRAVENOUS | 0 refills | Status: CP
Start: 2017-06-11 — End: ?
  Administered 2017-06-11 (×3): 140.25 mg via INTRAVENOUS

## 2017-06-11 MED ORDER — BEVACIZUMAB IVPB
5 mg/kg | Freq: Once | INTRAVENOUS | 0 refills | Status: CP
Start: 2017-06-11 — End: ?
  Administered 2017-06-11 (×2): 300 mg via INTRAVENOUS

## 2017-06-11 MED ORDER — FLUOROURACIL 5 GRAM/100 ML IV SOLN
400 mg/m2 | Freq: Once | INTRAVENOUS | 0 refills | Status: CP
Start: 2017-06-11 — End: ?
  Administered 2017-06-11: 19:00:00 660 mg via INTRAVENOUS

## 2017-06-11 MED ORDER — FLUOROURACIL IV AMB PUMP
2400 mg/m2 | Freq: Once | INTRAVENOUS | 0 refills | Status: CP
Start: 2017-06-11 — End: ?
  Administered 2017-06-11 (×2): 3960 mg via INTRAVENOUS

## 2017-06-11 MED ORDER — DIPHENOXYLATE-ATROPINE 2.5-0.025 MG PO TAB
1 | ORAL_TABLET | Freq: Four times a day (QID) | ORAL | 3 refills | Status: SS | PRN
Start: 2017-06-11 — End: 2018-07-18

## 2017-06-11 MED ORDER — DEXAMETHASONE 6 MG PO TAB
12 mg | Freq: Once | ORAL | 0 refills | Status: CP
Start: 2017-06-11 — End: ?
  Administered 2017-06-11: 16:00:00 12 mg via ORAL

## 2017-06-11 MED ORDER — LEUCOVORIN IVPB
400 mg/m2 | Freq: Once | INTRAVENOUS | 0 refills | Status: CP
Start: 2017-06-11 — End: ?
  Administered 2017-06-11 (×2): 660 mg via INTRAVENOUS

## 2017-06-13 ENCOUNTER — Encounter: Admit: 2017-06-13 | Discharge: 2017-06-13 | Payer: BC Managed Care – PPO

## 2017-06-13 DIAGNOSIS — C786 Secondary malignant neoplasm of retroperitoneum and peritoneum: ICD-10-CM

## 2017-06-13 DIAGNOSIS — C801 Malignant (primary) neoplasm, unspecified: ICD-10-CM

## 2017-06-13 DIAGNOSIS — C181 Malignant neoplasm of appendix: Principal | ICD-10-CM

## 2017-06-13 MED ORDER — SODIUM CHLORIDE 0.9 % IV SOLP
1000 mL | Freq: Once | INTRAVENOUS | 0 refills | Status: CP
Start: 2017-06-13 — End: ?
  Administered 2017-06-13: 18:00:00 1000 mL via INTRAVENOUS

## 2017-06-13 MED ORDER — HEPARIN, PORCINE (PF) 100 UNIT/ML IV SYRG
500 [IU] | Freq: Once | INTRAVENOUS | 0 refills | Status: CP
Start: 2017-06-13 — End: ?
  Administered 2017-06-13: 19:00:00 500 [IU] via INTRAVENOUS

## 2017-06-21 ENCOUNTER — Encounter: Admit: 2017-06-21 | Discharge: 2017-06-21 | Payer: BC Managed Care – PPO

## 2017-06-21 DIAGNOSIS — C801 Malignant (primary) neoplasm, unspecified: ICD-10-CM

## 2017-06-21 DIAGNOSIS — C181 Malignant neoplasm of appendix: Principal | ICD-10-CM

## 2017-06-21 MED ORDER — DEXAMETHASONE 6 MG PO TAB
12 mg | Freq: Once | ORAL | 0 refills | Status: CN
Start: 2017-06-21 — End: ?

## 2017-06-21 MED ORDER — LEUCOVORIN IVPB
400 mg/m2 | Freq: Once | INTRAVENOUS | 0 refills | Status: CN
Start: 2017-06-21 — End: ?

## 2017-06-21 MED ORDER — FLUOROURACIL IV AMB PUMP
2400 mg/m2 | Freq: Once | INTRAVENOUS | 0 refills | Status: CN
Start: 2017-06-21 — End: ?

## 2017-06-21 MED ORDER — OXALIPLATIN IVPB
85 mg/m2 | Freq: Once | INTRAVENOUS | 0 refills | Status: CN
Start: 2017-06-21 — End: ?

## 2017-06-21 MED ORDER — ONDANSETRON HCL 8 MG PO TAB
16 mg | Freq: Once | ORAL | 0 refills | Status: CN
Start: 2017-06-21 — End: ?

## 2017-06-25 ENCOUNTER — Encounter: Admit: 2017-06-25 | Discharge: 2017-06-25

## 2017-06-25 ENCOUNTER — Encounter: Admit: 2017-06-25 | Discharge: 2017-06-25 | Payer: BC Managed Care – PPO

## 2017-06-25 DIAGNOSIS — R197 Diarrhea, unspecified: ICD-10-CM

## 2017-06-25 DIAGNOSIS — N1330 Unspecified hydronephrosis: ICD-10-CM

## 2017-06-25 DIAGNOSIS — C181 Malignant neoplasm of appendix: ICD-10-CM

## 2017-06-25 DIAGNOSIS — Z9221 Personal history of antineoplastic chemotherapy: ICD-10-CM

## 2017-06-25 DIAGNOSIS — C786 Secondary malignant neoplasm of retroperitoneum and peritoneum: Principal | ICD-10-CM

## 2017-06-25 DIAGNOSIS — D509 Iron deficiency anemia, unspecified: ICD-10-CM

## 2017-06-25 DIAGNOSIS — C801 Malignant (primary) neoplasm, unspecified: ICD-10-CM

## 2017-06-25 DIAGNOSIS — K5900 Constipation, unspecified: ICD-10-CM

## 2017-06-25 LAB — CBC AND DIFF
Lab: 0 10*3/uL (ref 0–0.20)
Lab: 0.1 10*3/uL (ref 0–0.45)
Lab: 0.6 10*3/uL (ref 0–0.80)
Lab: 0.7 10*3/uL — ABNORMAL LOW (ref 1.8–7.0)
Lab: 0.9 10*3/uL — ABNORMAL LOW (ref 1.0–4.8)
Lab: 1 % (ref >60)
Lab: 2 % (ref >60)
Lab: 2.3 K/UL — ABNORMAL LOW (ref >60)
Lab: 211 10*3/uL — ABNORMAL HIGH (ref 150–400)
Lab: 25 % — ABNORMAL HIGH (ref 11–15)
Lab: 27 % — ABNORMAL HIGH (ref 4–12)
Lab: 27 % — ABNORMAL LOW (ref 36–45)
Lab: 27 pg (ref 26–34)
Lab: 29 % — ABNORMAL LOW (ref 41–77)
Lab: 3.3 M/UL — ABNORMAL LOW (ref 4.0–5.0)
Lab: 33 g/dL (ref 32.0–36.0)
Lab: 41 % (ref 24–44)
Lab: 6.6 FL — ABNORMAL LOW (ref 7–11)
Lab: 81 FL (ref 80–100)
Lab: 9 g/dL — ABNORMAL LOW (ref 12.0–15.0)

## 2017-06-25 LAB — CA125: Lab: 38 U/mL — ABNORMAL HIGH (ref <35)

## 2017-06-25 LAB — CEA(CARCINOEMBRYONIC AG): Lab: 116 ng/mL — ABNORMAL HIGH (ref <3.0)

## 2017-06-25 LAB — COMPREHENSIVE METABOLIC PANEL
Lab: 127 MMOL/L — ABNORMAL LOW (ref 137–147)
Lab: 3.6 MMOL/L (ref >60)

## 2017-06-25 LAB — CA19.9: Lab: <1 U/mL (ref <35)

## 2017-06-25 MED ORDER — HEPARIN, PORCINE (PF) 100 UNIT/ML IV SYRG
500 [IU] | Freq: Once | INTRAVENOUS | 0 refills | Status: CP
Start: 2017-06-25 — End: ?
  Administered 2017-06-25: 18:00:00 500 [IU] via INTRAVENOUS

## 2017-06-25 MED ORDER — SODIUM CHLORIDE 0.9 % IJ SOLN
50 mL | Freq: Once | INTRAVENOUS | 0 refills | Status: CP
Start: 2017-06-25 — End: ?
  Administered 2017-06-25: 15:00:00 50 mL via INTRAVENOUS

## 2017-06-25 MED ORDER — IOHEXOL 350 MG IODINE/ML IV SOLN
100 mL | Freq: Once | INTRAVENOUS | 0 refills | Status: CP
Start: 2017-06-25 — End: ?
  Administered 2017-06-25: 15:00:00 100 mL via INTRAVENOUS

## 2017-07-02 ENCOUNTER — Encounter: Admit: 2017-07-02 | Discharge: 2017-07-02 | Payer: BC Managed Care – PPO

## 2017-07-02 DIAGNOSIS — C787 Secondary malignant neoplasm of liver and intrahepatic bile duct: ICD-10-CM

## 2017-07-02 DIAGNOSIS — C7982 Secondary malignant neoplasm of genital organs: ICD-10-CM

## 2017-07-02 DIAGNOSIS — C786 Secondary malignant neoplasm of retroperitoneum and peritoneum: ICD-10-CM

## 2017-07-02 DIAGNOSIS — C801 Malignant (primary) neoplasm, unspecified: ICD-10-CM

## 2017-07-02 DIAGNOSIS — C181 Malignant neoplasm of appendix: Principal | ICD-10-CM

## 2017-07-02 DIAGNOSIS — C7989 Secondary malignant neoplasm of other specified sites: ICD-10-CM

## 2017-07-02 LAB — CBC AND DIFF: Lab: 0.1 10*3/uL (ref 0–0.45)

## 2017-07-02 LAB — COMPREHENSIVE METABOLIC PANEL: Lab: 56 mL/min — ABNORMAL LOW (ref >60)

## 2017-07-02 MED ORDER — FLUOROURACIL IV AMB PUMP
2400 mg/m2 | Freq: Once | INTRAVENOUS | 0 refills | Status: CP
Start: 2017-07-02 — End: ?
  Administered 2017-07-02 (×2): 3960 mg via INTRAVENOUS

## 2017-07-02 MED ORDER — BEVACIZUMAB IVPB
5 mg/kg | Freq: Once | INTRAVENOUS | 0 refills | Status: CP
Start: 2017-07-02 — End: ?
  Administered 2017-07-02 (×2): 300 mg via INTRAVENOUS

## 2017-07-02 MED ORDER — ONDANSETRON HCL 8 MG PO TAB
16 mg | Freq: Once | ORAL | 0 refills | Status: CP
Start: 2017-07-02 — End: ?
  Administered 2017-07-02: 15:00:00 16 mg via ORAL

## 2017-07-02 MED ORDER — ALTEPLASE 2 MG IK SOLR
2 mg | Freq: Once | INTRAMUSCULAR | 0 refills | Status: CP
Start: 2017-07-02 — End: ?
  Administered 2017-07-02: 14:00:00 2 mg via INTRAMUSCULAR

## 2017-07-02 MED ORDER — DEXAMETHASONE 6 MG PO TAB
12 mg | Freq: Once | ORAL | 0 refills | Status: CP
Start: 2017-07-02 — End: ?
  Administered 2017-07-02: 15:00:00 12 mg via ORAL

## 2017-07-02 MED ORDER — OXALIPLATIN IVPB
85 mg/m2 | Freq: Once | INTRAVENOUS | 0 refills | Status: CP
Start: 2017-07-02 — End: ?
  Administered 2017-07-02 (×3): 140.25 mg via INTRAVENOUS

## 2017-07-04 ENCOUNTER — Encounter: Admit: 2017-07-04 | Discharge: 2017-07-04 | Payer: BC Managed Care – PPO

## 2017-07-04 DIAGNOSIS — C181 Malignant neoplasm of appendix: ICD-10-CM

## 2017-07-04 DIAGNOSIS — C801 Malignant (primary) neoplasm, unspecified: ICD-10-CM

## 2017-07-04 DIAGNOSIS — C786 Secondary malignant neoplasm of retroperitoneum and peritoneum: ICD-10-CM

## 2017-07-04 DIAGNOSIS — Z451 Encounter for adjustment and management of infusion pump: Principal | ICD-10-CM

## 2017-07-04 MED ORDER — SODIUM CHLORIDE 0.9 % IV SOLP
1000 mL | Freq: Once | INTRAVENOUS | 0 refills | Status: CP
Start: 2017-07-04 — End: ?
  Administered 2017-07-04: 18:00:00 1000 mL via INTRAVENOUS

## 2017-07-04 MED ORDER — HEPARIN, PORCINE (PF) 100 UNIT/ML IV SYRG
500 [IU] | Freq: Once | INTRAVENOUS | 0 refills | Status: CP
Start: 2017-07-04 — End: ?
  Administered 2017-07-04: 19:00:00 500 [IU] via INTRAVENOUS

## 2017-07-16 ENCOUNTER — Encounter: Admit: 2017-07-16 | Discharge: 2017-07-16 | Payer: BC Managed Care – PPO

## 2017-07-16 DIAGNOSIS — C7989 Secondary malignant neoplasm of other specified sites: ICD-10-CM

## 2017-07-16 DIAGNOSIS — C181 Malignant neoplasm of appendix: Principal | ICD-10-CM

## 2017-07-16 DIAGNOSIS — C801 Malignant (primary) neoplasm, unspecified: ICD-10-CM

## 2017-07-16 DIAGNOSIS — C786 Secondary malignant neoplasm of retroperitoneum and peritoneum: ICD-10-CM

## 2017-07-16 DIAGNOSIS — C787 Secondary malignant neoplasm of liver and intrahepatic bile duct: ICD-10-CM

## 2017-07-16 DIAGNOSIS — C7982 Secondary malignant neoplasm of genital organs: ICD-10-CM

## 2017-07-16 LAB — CBC AND DIFF
Lab: 0 10*3/uL (ref 0–0.20)
Lab: 4 10*3/uL — ABNORMAL LOW (ref 4.5–11.0)

## 2017-07-16 LAB — CEA(CARCINOEMBRYONIC AG): Lab: 788 ng/mL — ABNORMAL HIGH (ref <3.0)

## 2017-07-16 LAB — COMPREHENSIVE METABOLIC PANEL
Lab: 0.4 mg/dL — ABNORMAL LOW (ref 0.3–1.2)
Lab: 0.9 mg/dL (ref 0.4–1.00)
Lab: 10 10*3/uL (ref 3–12)
Lab: 104 MMOL/L — ABNORMAL LOW (ref 98–110)
Lab: 11 U/L (ref 7–56)
Lab: 11 mg/dL (ref 7–25)
Lab: 115 mg/dL — ABNORMAL HIGH (ref 70–100)
Lab: 134 MMOL/L — ABNORMAL LOW (ref 137–147)
Lab: 134 U/L — ABNORMAL HIGH (ref 25–110)
Lab: 17 U/L (ref 7–40)
Lab: 20 MMOL/L — ABNORMAL LOW (ref 21–30)
Lab: 3.3 g/dL — ABNORMAL LOW (ref 3.5–5.0)
Lab: 3.7 MMOL/L — ABNORMAL LOW (ref 3.5–5.1)
Lab: 60 mL/min — ABNORMAL LOW (ref >60)
Lab: 7 g/dL (ref 6.0–8.0)
Lab: 9.3 mg/dL — ABNORMAL HIGH (ref 8.5–10.6)
Lab: >60 mL/min (ref >60)

## 2017-07-16 LAB — CA125: Lab: 40 U/mL — ABNORMAL HIGH (ref <35)

## 2017-07-16 MED ORDER — OXALIPLATIN IVPB
85 mg/m2 | Freq: Once | INTRAVENOUS | 0 refills | Status: CP
Start: 2017-07-16 — End: ?
  Administered 2017-07-16 (×3): 140.25 mg via INTRAVENOUS

## 2017-07-16 MED ORDER — ONDANSETRON HCL 8 MG PO TAB
16 mg | Freq: Once | ORAL | 0 refills | Status: CP
Start: 2017-07-16 — End: ?
  Administered 2017-07-16: 14:00:00 16 mg via ORAL

## 2017-07-16 MED ORDER — FLUOROURACIL IV AMB PUMP
2400 mg/m2 | Freq: Once | INTRAVENOUS | 0 refills | Status: CP
Start: 2017-07-16 — End: ?
  Administered 2017-07-16 (×2): 3960 mg via INTRAVENOUS

## 2017-07-16 MED ORDER — BEVACIZUMAB IVPB
5 mg/kg | Freq: Once | INTRAVENOUS | 0 refills | Status: CP
Start: 2017-07-16 — End: ?
  Administered 2017-07-16 (×2): 300 mg via INTRAVENOUS

## 2017-07-16 MED ORDER — DEXAMETHASONE 6 MG PO TAB
12 mg | Freq: Once | ORAL | 0 refills | Status: CP
Start: 2017-07-16 — End: ?
  Administered 2017-07-16: 14:00:00 12 mg via ORAL

## 2017-07-18 ENCOUNTER — Encounter: Admit: 2017-07-18 | Discharge: 2017-07-18 | Payer: BC Managed Care – PPO

## 2017-07-18 DIAGNOSIS — C181 Malignant neoplasm of appendix: Principal | ICD-10-CM

## 2017-07-18 DIAGNOSIS — C786 Secondary malignant neoplasm of retroperitoneum and peritoneum: ICD-10-CM

## 2017-07-18 DIAGNOSIS — C801 Malignant (primary) neoplasm, unspecified: ICD-10-CM

## 2017-07-18 MED ORDER — HEPARIN, PORCINE (PF) 100 UNIT/ML IV SYRG
500 [IU] | Freq: Once | INTRAVENOUS | 0 refills | Status: CP
Start: 2017-07-18 — End: ?
  Administered 2017-07-18: 19:00:00 500 [IU] via INTRAVENOUS

## 2017-07-18 MED ORDER — SODIUM CHLORIDE 0.9 % IV SOLP
1000 mL | Freq: Once | INTRAVENOUS | 0 refills | Status: CP
Start: 2017-07-18 — End: ?
  Administered 2017-07-18: 17:00:00 1000 mL via INTRAVENOUS

## 2017-07-30 ENCOUNTER — Encounter: Admit: 2017-07-30 | Discharge: 2017-07-30 | Payer: BC Managed Care – PPO

## 2017-07-30 ENCOUNTER — Encounter: Admit: 2017-07-30 | Discharge: 2017-07-30

## 2017-07-30 DIAGNOSIS — C7989 Secondary malignant neoplasm of other specified sites: ICD-10-CM

## 2017-07-30 DIAGNOSIS — R197 Diarrhea, unspecified: ICD-10-CM

## 2017-07-30 DIAGNOSIS — K5900 Constipation, unspecified: ICD-10-CM

## 2017-07-30 DIAGNOSIS — R634 Abnormal weight loss: ICD-10-CM

## 2017-07-30 DIAGNOSIS — C786 Secondary malignant neoplasm of retroperitoneum and peritoneum: ICD-10-CM

## 2017-07-30 DIAGNOSIS — N1330 Unspecified hydronephrosis: ICD-10-CM

## 2017-07-30 DIAGNOSIS — C7982 Secondary malignant neoplasm of genital organs: ICD-10-CM

## 2017-07-30 DIAGNOSIS — D509 Iron deficiency anemia, unspecified: ICD-10-CM

## 2017-07-30 DIAGNOSIS — C181 Malignant neoplasm of appendix: Principal | ICD-10-CM

## 2017-07-30 DIAGNOSIS — C787 Secondary malignant neoplasm of liver and intrahepatic bile duct: ICD-10-CM

## 2017-07-30 DIAGNOSIS — C801 Malignant (primary) neoplasm, unspecified: ICD-10-CM

## 2017-07-30 DIAGNOSIS — Z5111 Encounter for antineoplastic chemotherapy: ICD-10-CM

## 2017-07-30 LAB — COMPREHENSIVE METABOLIC PANEL
Lab: 0.4 mg/dL — ABNORMAL LOW (ref 0.3–1.2)
Lab: 1 mg/dL — ABNORMAL HIGH (ref 0.4–1.00)
Lab: 12 U/L (ref 7–56)
Lab: 15 mg/dL (ref 7–25)
Lab: 157 U/L — ABNORMAL HIGH (ref 25–110)
Lab: 17 U/L — ABNORMAL HIGH (ref 7–40)
Lab: 23 MMOL/L (ref 21–30)
Lab: 3.5 g/dL (ref 3.5–5.0)
Lab: 55 mL/min — ABNORMAL LOW (ref >60)
Lab: 7 10*3/uL (ref 3–12)
Lab: 7.3 g/dL (ref 6.0–8.0)
Lab: 9.4 mg/dL — ABNORMAL HIGH (ref 8.5–10.6)
Lab: 96 mg/dL (ref 70–100)
Lab: >60 mL/min (ref >60)

## 2017-07-30 LAB — CBC AND DIFF
Lab: 0 10*3/uL (ref 0–0.20)
Lab: 0.1 10*3/uL (ref 0–0.45)

## 2017-07-30 MED ORDER — BEVACIZUMAB IVPB
5 mg/kg | Freq: Once | INTRAVENOUS | 0 refills | Status: CP
Start: 2017-07-30 — End: ?
  Administered 2017-07-30 (×2): 300 mg via INTRAVENOUS

## 2017-07-30 MED ORDER — FLUOROURACIL IV AMB PUMP
2400 mg/m2 | Freq: Once | INTRAVENOUS | 0 refills | Status: CP
Start: 2017-07-30 — End: ?
  Administered 2017-07-30: 19:00:00 3960 mg via INTRAVENOUS

## 2017-07-30 MED ORDER — ONDANSETRON HCL 8 MG PO TAB
16 mg | Freq: Once | ORAL | 0 refills | Status: CP
Start: 2017-07-30 — End: ?
  Administered 2017-07-30: 16:00:00 16 mg via ORAL

## 2017-07-30 MED ORDER — OXALIPLATIN IVPB
85 mg/m2 | Freq: Once | INTRAVENOUS | 0 refills | Status: CP
Start: 2017-07-30 — End: ?
  Administered 2017-07-30 (×3): 140.25 mg via INTRAVENOUS

## 2017-07-30 MED ORDER — HEPARIN, PORCINE (PF) 100 UNIT/ML IV SYRG
500 [IU] | Freq: Once | INTRAVENOUS | 0 refills | Status: AC
Start: 2017-07-30 — End: ?

## 2017-07-30 MED ORDER — DEXAMETHASONE 6 MG PO TAB
12 mg | Freq: Once | ORAL | 0 refills | Status: CP
Start: 2017-07-30 — End: ?
  Administered 2017-07-30: 16:00:00 12 mg via ORAL

## 2017-08-01 ENCOUNTER — Encounter: Admit: 2017-08-01 | Discharge: 2017-08-01 | Payer: BC Managed Care – PPO

## 2017-08-01 DIAGNOSIS — C801 Malignant (primary) neoplasm, unspecified: ICD-10-CM

## 2017-08-01 DIAGNOSIS — C181 Malignant neoplasm of appendix: Principal | ICD-10-CM

## 2017-08-01 DIAGNOSIS — C786 Secondary malignant neoplasm of retroperitoneum and peritoneum: Secondary | ICD-10-CM

## 2017-08-01 MED ORDER — HEPARIN, PORCINE (PF) 100 UNIT/ML IV SYRG
500 [IU] | Freq: Once | 0 refills | Status: CP
Start: 2017-08-01 — End: ?

## 2017-08-01 MED ORDER — SODIUM CHLORIDE 0.9 % IV SOLP
1000 mL | Freq: Once | INTRAVENOUS | 0 refills | Status: CP
Start: 2017-08-01 — End: ?
  Administered 2017-08-01: 18:00:00 1000 mL via INTRAVENOUS

## 2017-08-02 ENCOUNTER — Encounter: Admit: 2017-08-02 | Discharge: 2017-08-02 | Payer: BC Managed Care – PPO

## 2017-08-02 DIAGNOSIS — C801 Malignant (primary) neoplasm, unspecified: ICD-10-CM

## 2017-08-02 DIAGNOSIS — C181 Malignant neoplasm of appendix: Principal | ICD-10-CM

## 2017-08-02 MED ORDER — FLUOROURACIL IV AMB PUMP
2400 mg/m2 | Freq: Once | INTRAVENOUS | 0 refills | Status: CN
Start: 2017-08-02 — End: ?

## 2017-08-02 MED ORDER — ONDANSETRON HCL 8 MG PO TAB
16 mg | Freq: Once | ORAL | 0 refills | Status: CN
Start: 2017-08-02 — End: ?

## 2017-08-02 MED ORDER — DEXAMETHASONE 6 MG PO TAB
12 mg | Freq: Once | ORAL | 0 refills | Status: CN
Start: 2017-08-02 — End: ?

## 2017-08-02 MED ORDER — OXALIPLATIN IVPB
85 mg/m2 | Freq: Once | INTRAVENOUS | 0 refills | Status: CN
Start: 2017-08-02 — End: ?

## 2017-08-08 ENCOUNTER — Encounter: Admit: 2017-08-08 | Discharge: 2017-08-08 | Payer: BC Managed Care – PPO

## 2017-08-08 DIAGNOSIS — C801 Malignant (primary) neoplasm, unspecified: ICD-10-CM

## 2017-08-08 DIAGNOSIS — C181 Malignant neoplasm of appendix: Principal | ICD-10-CM

## 2017-08-08 DIAGNOSIS — C786 Secondary malignant neoplasm of retroperitoneum and peritoneum: ICD-10-CM

## 2017-08-15 ENCOUNTER — Encounter: Admit: 2017-08-15 | Discharge: 2017-08-15 | Payer: BC Managed Care – PPO

## 2017-08-15 DIAGNOSIS — C181 Malignant neoplasm of appendix: Principal | ICD-10-CM

## 2017-08-15 DIAGNOSIS — D591 Other autoimmune hemolytic anemias: ICD-10-CM

## 2017-08-15 DIAGNOSIS — K5900 Constipation, unspecified: ICD-10-CM

## 2017-08-15 DIAGNOSIS — C7989 Secondary malignant neoplasm of other specified sites: ICD-10-CM

## 2017-08-15 DIAGNOSIS — R197 Diarrhea, unspecified: ICD-10-CM

## 2017-08-15 DIAGNOSIS — C801 Malignant (primary) neoplasm, unspecified: ICD-10-CM

## 2017-08-15 DIAGNOSIS — C786 Secondary malignant neoplasm of retroperitoneum and peritoneum: ICD-10-CM

## 2017-08-15 DIAGNOSIS — D509 Iron deficiency anemia, unspecified: ICD-10-CM

## 2017-08-15 DIAGNOSIS — R634 Abnormal weight loss: ICD-10-CM

## 2017-08-15 DIAGNOSIS — C787 Secondary malignant neoplasm of liver and intrahepatic bile duct: ICD-10-CM

## 2017-08-15 DIAGNOSIS — C7982 Secondary malignant neoplasm of genital organs: ICD-10-CM

## 2017-08-15 LAB — CBC AND DIFF: Lab: 4.8 10*3/uL (ref 4.5–11.0)

## 2017-08-15 LAB — COMPREHENSIVE METABOLIC PANEL
Lab: 121 mg/dL — ABNORMAL HIGH (ref 70–100)
Lab: 133 MMOL/L — ABNORMAL LOW (ref 137–147)
Lab: 4.2 MMOL/L — ABNORMAL LOW (ref 3.5–5.1)
Lab: 98 MMOL/L — ABNORMAL LOW (ref 98–110)

## 2017-08-15 LAB — CA125: Lab: 34 U/mL (ref <35)

## 2017-08-15 LAB — CEA(CARCINOEMBRYONIC AG): Lab: 696 ng/mL — ABNORMAL HIGH (ref <3.0)

## 2017-08-15 MED ORDER — ONDANSETRON HCL 8 MG PO TAB
16 mg | Freq: Once | ORAL | 0 refills | Status: CP
Start: 2017-08-15 — End: ?
  Administered 2017-08-15: 16:00:00 16 mg via ORAL

## 2017-08-15 MED ORDER — BEVACIZUMAB IVPB
5 mg/kg | Freq: Once | INTRAVENOUS | 0 refills | Status: CP
Start: 2017-08-15 — End: ?
  Administered 2017-08-15: 16:00:00 300 mg via INTRAVENOUS

## 2017-08-15 MED ORDER — FLUOROURACIL IV AMB PUMP
2400 mg/m2 | Freq: Once | INTRAVENOUS | 0 refills | Status: CP
Start: 2017-08-15 — End: ?
  Administered 2017-08-15 (×2): 3960 mg via INTRAVENOUS

## 2017-08-15 MED ORDER — OXALIPLATIN IVPB
85 mg/m2 | Freq: Once | INTRAVENOUS | 0 refills | Status: CP
Start: 2017-08-15 — End: ?
  Administered 2017-08-15 (×3): 140.25 mg via INTRAVENOUS

## 2017-08-15 MED ORDER — DEXAMETHASONE 6 MG PO TAB
12 mg | Freq: Once | ORAL | 0 refills | Status: CP
Start: 2017-08-15 — End: ?
  Administered 2017-08-15: 16:00:00 12 mg via ORAL

## 2017-08-17 ENCOUNTER — Encounter: Admit: 2017-08-17 | Discharge: 2017-08-17 | Payer: BC Managed Care – PPO

## 2017-08-17 DIAGNOSIS — C7982 Secondary malignant neoplasm of genital organs: ICD-10-CM

## 2017-08-17 DIAGNOSIS — C181 Malignant neoplasm of appendix: Principal | ICD-10-CM

## 2017-08-17 DIAGNOSIS — C7989 Secondary malignant neoplasm of other specified sites: ICD-10-CM

## 2017-08-17 DIAGNOSIS — C787 Secondary malignant neoplasm of liver and intrahepatic bile duct: ICD-10-CM

## 2017-08-17 DIAGNOSIS — C786 Secondary malignant neoplasm of retroperitoneum and peritoneum: ICD-10-CM

## 2017-08-17 DIAGNOSIS — C801 Malignant (primary) neoplasm, unspecified: ICD-10-CM

## 2017-08-17 MED ORDER — HEPARIN, PORCINE (PF) 100 UNIT/ML IV SYRG
500 [IU] | Freq: Once | 0 refills | Status: CP
Start: 2017-08-17 — End: ?

## 2017-08-17 MED ORDER — SODIUM CHLORIDE 0.9 % IV SOLP
1000 mL | Freq: Once | INTRAVENOUS | 0 refills | Status: CP
Start: 2017-08-17 — End: ?
  Administered 2017-08-17: 18:00:00 1000 mL via INTRAVENOUS

## 2017-08-24 ENCOUNTER — Encounter: Admit: 2017-08-24 | Discharge: 2017-08-24 | Payer: BC Managed Care – PPO

## 2017-08-27 ENCOUNTER — Encounter: Admit: 2017-08-27 | Discharge: 2017-08-27 | Payer: BC Managed Care – PPO

## 2017-08-27 DIAGNOSIS — C801 Malignant (primary) neoplasm, unspecified: ICD-10-CM

## 2017-08-27 DIAGNOSIS — C786 Secondary malignant neoplasm of retroperitoneum and peritoneum: ICD-10-CM

## 2017-08-27 DIAGNOSIS — C7982 Secondary malignant neoplasm of genital organs: ICD-10-CM

## 2017-08-27 DIAGNOSIS — C7989 Secondary malignant neoplasm of other specified sites: ICD-10-CM

## 2017-08-27 DIAGNOSIS — C787 Secondary malignant neoplasm of liver and intrahepatic bile duct: ICD-10-CM

## 2017-08-27 DIAGNOSIS — C181 Malignant neoplasm of appendix: Principal | ICD-10-CM

## 2017-08-27 LAB — CBC AND DIFF
Lab: 0 10*3/uL (ref 0–0.20)
Lab: 0.2 10*3/uL (ref 0–0.45)

## 2017-08-27 LAB — COMPREHENSIVE METABOLIC PANEL
Lab: 0.5 mg/dL — ABNORMAL LOW (ref 0.3–1.2)
Lab: 10 10*3/uL (ref 3–12)
Lab: 134 U/L — ABNORMAL HIGH (ref 25–110)
Lab: 15 U/L (ref 7–56)
Lab: 20 MMOL/L — ABNORMAL LOW (ref 21–30)
Lab: 20 U/L — ABNORMAL HIGH (ref 7–40)
Lab: 3.4 g/dL — ABNORMAL LOW (ref 3.5–5.0)
Lab: 55 mL/min — ABNORMAL LOW (ref >60)
Lab: 7.3 g/dL (ref 6.0–8.0)
Lab: >60 mL/min (ref >60)

## 2017-08-27 MED ORDER — ONDANSETRON HCL 8 MG PO TAB
16 mg | Freq: Once | ORAL | 0 refills | Status: CP
Start: 2017-08-27 — End: ?
  Administered 2017-08-27: 14:00:00 16 mg via ORAL

## 2017-08-27 MED ORDER — OXALIPLATIN IVPB
85 mg/m2 | Freq: Once | INTRAVENOUS | 0 refills | Status: CP
Start: 2017-08-27 — End: ?
  Administered 2017-08-27 (×3): 140.25 mg via INTRAVENOUS

## 2017-08-27 MED ORDER — FLUOROURACIL IV AMB PUMP
2400 mg/m2 | Freq: Once | INTRAVENOUS | 0 refills | Status: CP
Start: 2017-08-27 — End: ?
  Administered 2017-08-27 (×2): 3960 mg via INTRAVENOUS

## 2017-08-27 MED ORDER — DEXAMETHASONE 6 MG PO TAB
12 mg | Freq: Once | ORAL | 0 refills | Status: CP
Start: 2017-08-27 — End: ?

## 2017-08-27 MED ORDER — BEVACIZUMAB IVPB
5 mg/kg | Freq: Once | INTRAVENOUS | 0 refills | Status: CP
Start: 2017-08-27 — End: ?
  Administered 2017-08-27 (×2): 300 mg via INTRAVENOUS

## 2017-08-29 ENCOUNTER — Encounter: Admit: 2017-08-29 | Discharge: 2017-08-29 | Payer: BC Managed Care – PPO

## 2017-08-29 DIAGNOSIS — C7982 Secondary malignant neoplasm of genital organs: ICD-10-CM

## 2017-08-29 DIAGNOSIS — C801 Malignant (primary) neoplasm, unspecified: ICD-10-CM

## 2017-08-29 DIAGNOSIS — C787 Secondary malignant neoplasm of liver and intrahepatic bile duct: ICD-10-CM

## 2017-08-29 DIAGNOSIS — C7989 Secondary malignant neoplasm of other specified sites: ICD-10-CM

## 2017-08-29 DIAGNOSIS — C181 Malignant neoplasm of appendix: Principal | ICD-10-CM

## 2017-08-29 DIAGNOSIS — C786 Secondary malignant neoplasm of retroperitoneum and peritoneum: ICD-10-CM

## 2017-08-29 MED ORDER — SODIUM CHLORIDE 0.9 % IV SOLP
1000 mL | Freq: Once | INTRAVENOUS | 0 refills | Status: CP
Start: 2017-08-29 — End: ?
  Administered 2017-08-29: 17:00:00 1000 mL via INTRAVENOUS

## 2017-08-29 MED ORDER — HEPARIN, PORCINE (PF) 100 UNIT/ML IV SYRG
500 [IU] | Freq: Once | 0 refills | Status: CP
Start: 2017-08-29 — End: ?

## 2017-08-29 MED ORDER — DEXAMETHASONE 4 MG PO TAB
ORAL_TABLET | INTRAMUSCULAR | 3 refills | 14.00000 days | Status: AC
Start: 2017-08-29 — End: 2018-12-16

## 2017-09-07 ENCOUNTER — Encounter: Admit: 2017-09-07 | Discharge: 2017-09-07 | Payer: BC Managed Care – PPO

## 2017-09-07 DIAGNOSIS — C801 Malignant (primary) neoplasm, unspecified: ICD-10-CM

## 2017-09-07 DIAGNOSIS — C181 Malignant neoplasm of appendix: Principal | ICD-10-CM

## 2017-09-07 DIAGNOSIS — C786 Secondary malignant neoplasm of retroperitoneum and peritoneum: ICD-10-CM

## 2017-09-07 MED ORDER — IOHEXOL 350 MG IODINE/ML IV SOLN
80 mL | Freq: Once | INTRAVENOUS | 0 refills | Status: CP
Start: 2017-09-07 — End: ?
  Administered 2017-09-07: 13:00:00 80 mL via INTRAVENOUS

## 2017-09-07 MED ORDER — SODIUM CHLORIDE 0.9 % IJ SOLN
50 mL | Freq: Once | INTRAVENOUS | 0 refills | Status: CP
Start: 2017-09-07 — End: ?
  Administered 2017-09-07: 13:00:00 50 mL via INTRAVENOUS

## 2017-09-07 MED ORDER — HEPARIN, PORCINE (PF) 100 UNIT/ML IV SYRG
500 [IU] | Freq: Once | 0 refills | Status: CP
Start: 2017-09-07 — End: ?

## 2017-09-10 ENCOUNTER — Encounter: Admit: 2017-09-10 | Discharge: 2017-09-10 | Payer: BC Managed Care – PPO

## 2017-09-10 DIAGNOSIS — Z5111 Encounter for antineoplastic chemotherapy: ICD-10-CM

## 2017-09-10 DIAGNOSIS — C181 Malignant neoplasm of appendix: Principal | ICD-10-CM

## 2017-09-10 DIAGNOSIS — C801 Malignant (primary) neoplasm, unspecified: ICD-10-CM

## 2017-09-10 DIAGNOSIS — C7989 Secondary malignant neoplasm of other specified sites: ICD-10-CM

## 2017-09-10 DIAGNOSIS — D509 Iron deficiency anemia, unspecified: ICD-10-CM

## 2017-09-10 DIAGNOSIS — C786 Secondary malignant neoplasm of retroperitoneum and peritoneum: Secondary | ICD-10-CM

## 2017-09-10 DIAGNOSIS — R197 Diarrhea, unspecified: ICD-10-CM

## 2017-09-10 DIAGNOSIS — C787 Secondary malignant neoplasm of liver and intrahepatic bile duct: ICD-10-CM

## 2017-09-10 DIAGNOSIS — C7982 Secondary malignant neoplasm of genital organs: ICD-10-CM

## 2017-09-10 DIAGNOSIS — K5900 Constipation, unspecified: ICD-10-CM

## 2017-09-10 LAB — COMPREHENSIVE METABOLIC PANEL
Lab: 129 MMOL/L — ABNORMAL LOW (ref 137–147)
Lab: 163 mg/dL — ABNORMAL HIGH (ref 70–100)
Lab: 18 mg/dL — ABNORMAL LOW (ref >60)
Lab: 4.5 MMOL/L — ABNORMAL LOW (ref 3.5–5.1)
Lab: 98 MMOL/L — ABNORMAL LOW (ref 98–110)

## 2017-09-10 LAB — CBC AND DIFF: Lab: 4.2 10*3/uL — ABNORMAL LOW (ref 4.5–11.0)

## 2017-09-10 LAB — CEA(CARCINOEMBRYONIC AG): Lab: 601 ng/mL — ABNORMAL HIGH (ref <3.0)

## 2017-09-10 LAB — CA125: Lab: 31 U/mL (ref <35)

## 2017-09-10 MED ORDER — OXALIPLATIN IVPB
85 mg/m2 | Freq: Once | INTRAVENOUS | 0 refills | Status: CN
Start: 2017-09-10 — End: ?

## 2017-09-10 MED ORDER — FLUOROURACIL IV AMB PUMP
2400 mg/m2 | Freq: Once | INTRAVENOUS | 0 refills | Status: CN
Start: 2017-09-10 — End: ?

## 2017-09-10 MED ORDER — ONDANSETRON HCL 8 MG PO TAB
16 mg | Freq: Once | ORAL | 0 refills | Status: CN
Start: 2017-09-10 — End: ?

## 2017-09-10 MED ORDER — DEXAMETHASONE 6 MG PO TAB
12 mg | Freq: Once | ORAL | 0 refills | Status: CN
Start: 2017-09-10 — End: ?

## 2017-09-10 MED ORDER — ONDANSETRON HCL 8 MG PO TAB
16 mg | Freq: Once | ORAL | 0 refills | Status: CP
Start: 2017-09-10 — End: ?
  Administered 2017-09-10: 15:00:00 16 mg via ORAL

## 2017-09-10 MED ORDER — FLUOROURACIL IV AMB PUMP
2400 mg/m2 | Freq: Once | INTRAVENOUS | 0 refills | Status: CP
Start: 2017-09-10 — End: ?
  Administered 2017-09-10: 16:00:00 3960 mg via INTRAVENOUS

## 2017-09-10 MED ORDER — DEXAMETHASONE 6 MG PO TAB
12 mg | Freq: Once | ORAL | 0 refills | Status: DC
Start: 2017-09-10 — End: 2017-09-10

## 2017-09-10 MED ORDER — OXALIPLATIN IVPB
85 mg/m2 | Freq: Once | INTRAVENOUS | 0 refills | Status: DC
Start: 2017-09-10 — End: 2017-09-10

## 2017-09-10 MED ORDER — BEVACIZUMAB IVPB
5 mg/kg | Freq: Once | INTRAVENOUS | 0 refills | Status: CP
Start: 2017-09-10 — End: ?
  Administered 2017-09-10 (×2): 300 mg via INTRAVENOUS

## 2017-09-12 ENCOUNTER — Encounter: Admit: 2017-09-12 | Discharge: 2017-09-12 | Payer: BC Managed Care – PPO

## 2017-09-12 DIAGNOSIS — C786 Secondary malignant neoplasm of retroperitoneum and peritoneum: ICD-10-CM

## 2017-09-12 DIAGNOSIS — C181 Malignant neoplasm of appendix: Principal | ICD-10-CM

## 2017-09-12 DIAGNOSIS — C801 Malignant (primary) neoplasm, unspecified: ICD-10-CM

## 2017-09-12 MED ORDER — SODIUM CHLORIDE 0.9 % IV SOLP
1000 mL | Freq: Once | INTRAVENOUS | 0 refills | Status: CP
Start: 2017-09-12 — End: ?
  Administered 2017-09-12: 13:00:00 1000 mL via INTRAVENOUS

## 2017-09-12 MED ORDER — HEPARIN, PORCINE (PF) 100 UNIT/ML IV SYRG
500 [IU] | Freq: Once | 0 refills | Status: CP
Start: 2017-09-12 — End: ?

## 2017-09-24 ENCOUNTER — Encounter: Admit: 2017-09-24 | Discharge: 2017-09-24 | Payer: BC Managed Care – PPO

## 2017-09-24 ENCOUNTER — Encounter: Admit: 2017-09-24 | Discharge: 2017-09-24

## 2017-09-24 DIAGNOSIS — C181 Malignant neoplasm of appendix: Principal | ICD-10-CM

## 2017-09-24 DIAGNOSIS — C801 Malignant (primary) neoplasm, unspecified: ICD-10-CM

## 2017-09-24 DIAGNOSIS — C787 Secondary malignant neoplasm of liver and intrahepatic bile duct: ICD-10-CM

## 2017-09-24 DIAGNOSIS — C7989 Secondary malignant neoplasm of other specified sites: ICD-10-CM

## 2017-09-24 DIAGNOSIS — C7982 Secondary malignant neoplasm of genital organs: ICD-10-CM

## 2017-09-24 DIAGNOSIS — C786 Secondary malignant neoplasm of retroperitoneum and peritoneum: ICD-10-CM

## 2017-09-24 DIAGNOSIS — K56609 Unspecified intestinal obstruction, unspecified as to partial versus complete obstruction: ICD-10-CM

## 2017-09-24 LAB — COMPREHENSIVE METABOLIC PANEL
Lab: 0.4 mg/dL — ABNORMAL LOW (ref >60)
Lab: 1.8 mg/dL — ABNORMAL HIGH (ref 0.4–1.00)
Lab: 10 10*3/uL (ref 3–12)
Lab: 129 MMOL/L — ABNORMAL LOW (ref 137–147)
Lab: 14 U/L (ref 7–56)
Lab: 140 mg/dL — ABNORMAL HIGH (ref 70–100)
Lab: 144 U/L — ABNORMAL HIGH (ref 25–110)
Lab: 19 U/L (ref 7–40)
Lab: 23 MMOL/L (ref 21–30)
Lab: 27 mL/min — ABNORMAL LOW (ref >60)
Lab: 3.8 g/dL — ABNORMAL LOW (ref >60)
Lab: 33 mL/min — ABNORMAL LOW (ref >60)
Lab: 4.5 MMOL/L — ABNORMAL LOW (ref 3.5–5.1)
Lab: 44 mg/dL — ABNORMAL HIGH (ref 7–25)
Lab: 7.7 g/dL (ref 6.0–8.0)
Lab: 9.7 mg/dL — ABNORMAL HIGH (ref 8.5–10.6)
Lab: 96 MMOL/L — ABNORMAL LOW (ref 98–110)

## 2017-09-24 LAB — CBC AND DIFF
Lab: 0 10*3/uL (ref 0–0.20)
Lab: 0.2 10*3/uL (ref 0–0.45)
Lab: 6 10*3/uL (ref 4.5–11.0)

## 2017-09-24 LAB — CA125: Lab: 34 U/mL — ABNORMAL HIGH (ref <35)

## 2017-09-24 LAB — CEA(CARCINOEMBRYONIC AG): Lab: 562 ng/mL — ABNORMAL HIGH (ref <3.0)

## 2017-09-24 MED ORDER — FLUOROURACIL IV AMB PUMP
2400 mg/m2 | Freq: Once | INTRAVENOUS | 0 refills | Status: CP
Start: 2017-09-24 — End: ?
  Administered 2017-09-24 (×2): 3960 mg via INTRAVENOUS

## 2017-09-24 MED ORDER — DEXAMETHASONE 6 MG PO TAB
12 mg | Freq: Once | ORAL | 0 refills | Status: CP
Start: 2017-09-24 — End: ?
  Administered 2017-09-24: 14:00:00 12 mg via ORAL

## 2017-09-24 MED ORDER — ONDANSETRON HCL 8 MG PO TAB
16 mg | Freq: Once | ORAL | 0 refills | Status: CP
Start: 2017-09-24 — End: ?
  Administered 2017-09-24: 14:00:00 16 mg via ORAL

## 2017-09-24 MED ORDER — SODIUM CHLORIDE 0.9 % IV SOLP
1000 mL | Freq: Once | INTRAVENOUS | 0 refills | Status: CP
Start: 2017-09-24 — End: ?
  Administered 2017-09-24: 14:00:00 1000 mL via INTRAVENOUS

## 2017-09-24 MED ORDER — BEVACIZUMAB IVPB
5 mg/kg | Freq: Once | INTRAVENOUS | 0 refills | Status: CP
Start: 2017-09-24 — End: ?
  Administered 2017-09-24 (×2): 300 mg via INTRAVENOUS

## 2017-09-24 MED ORDER — FLUOROURACIL IV AMB PUMP
2400 mg/m2 | Freq: Once | INTRAVENOUS | 0 refills | Status: CN
Start: 2017-09-24 — End: ?

## 2017-09-25 ENCOUNTER — Encounter: Admit: 2017-09-25 | Discharge: 2017-09-25 | Payer: BC Managed Care – PPO

## 2017-09-25 DIAGNOSIS — C181 Malignant neoplasm of appendix: Principal | ICD-10-CM

## 2017-09-25 DIAGNOSIS — C801 Malignant (primary) neoplasm, unspecified: ICD-10-CM

## 2017-09-26 ENCOUNTER — Encounter: Admit: 2017-09-26 | Discharge: 2017-09-26 | Payer: BC Managed Care – PPO

## 2017-09-26 DIAGNOSIS — C181 Malignant neoplasm of appendix: Principal | ICD-10-CM

## 2017-09-26 DIAGNOSIS — C801 Malignant (primary) neoplasm, unspecified: ICD-10-CM

## 2017-09-26 DIAGNOSIS — C786 Secondary malignant neoplasm of retroperitoneum and peritoneum: ICD-10-CM

## 2017-09-26 MED ORDER — HEPARIN, PORCINE (PF) 100 UNIT/ML IV SYRG
500 [IU] | Freq: Once | 0 refills | Status: CP
Start: 2017-09-26 — End: ?

## 2017-09-26 MED ORDER — SODIUM CHLORIDE 0.9 % IV SOLP
1000 mL | Freq: Once | INTRAVENOUS | 0 refills | Status: CP
Start: 2017-09-26 — End: ?
  Administered 2017-09-26: 14:00:00 1000 mL via INTRAVENOUS

## 2017-10-08 ENCOUNTER — Encounter: Admit: 2017-10-08 | Discharge: 2017-10-08 | Payer: BC Managed Care – PPO

## 2017-10-08 DIAGNOSIS — C801 Malignant (primary) neoplasm, unspecified: ICD-10-CM

## 2017-10-08 DIAGNOSIS — N1330 Unspecified hydronephrosis: ICD-10-CM

## 2017-10-08 DIAGNOSIS — D509 Iron deficiency anemia, unspecified: ICD-10-CM

## 2017-10-08 DIAGNOSIS — C786 Secondary malignant neoplasm of retroperitoneum and peritoneum: ICD-10-CM

## 2017-10-08 DIAGNOSIS — C181 Malignant neoplasm of appendix: Principal | ICD-10-CM

## 2017-10-08 DIAGNOSIS — C7982 Secondary malignant neoplasm of genital organs: ICD-10-CM

## 2017-10-08 DIAGNOSIS — C7989 Secondary malignant neoplasm of other specified sites: ICD-10-CM

## 2017-10-08 DIAGNOSIS — G629 Polyneuropathy, unspecified: ICD-10-CM

## 2017-10-08 DIAGNOSIS — C787 Secondary malignant neoplasm of liver and intrahepatic bile duct: ICD-10-CM

## 2017-10-08 LAB — CA125: Lab: 34 U/mL (ref <35)

## 2017-10-08 LAB — CEA(CARCINOEMBRYONIC AG): Lab: 585 ng/mL — ABNORMAL HIGH (ref <3.0)

## 2017-10-08 LAB — COMPREHENSIVE METABOLIC PANEL
Lab: 0.4 mg/dL — ABNORMAL LOW (ref 0.3–1.2)
Lab: 1 mg/dL — ABNORMAL HIGH (ref 0.4–1.00)
Lab: 106 MMOL/L — ABNORMAL LOW (ref 98–110)
Lab: 134 MMOL/L — ABNORMAL LOW (ref 137–147)
Lab: 19 mg/dL (ref 7–25)
Lab: 4 MMOL/L — ABNORMAL LOW (ref 3.5–5.1)
Lab: 7.3 g/dL (ref 6.0–8.0)
Lab: 84 mg/dL (ref 70–100)
Lab: 9 mg/dL — ABNORMAL HIGH (ref 8.5–10.6)

## 2017-10-08 LAB — CBC AND DIFF: Lab: 4.5 10*3/uL (ref 4.5–11.0)

## 2017-10-08 MED ORDER — FERRIC CARBOXYMALTOSE IVPB
750 mg | Freq: Once | INTRAVENOUS | 0 refills | Status: CN
Start: 2017-10-08 — End: ?

## 2017-10-08 MED ORDER — DEXAMETHASONE 4 MG PO TAB
4 mg | Freq: Once | ORAL | 0 refills | Status: CN
Start: 2017-10-08 — End: ?

## 2017-10-08 MED ORDER — FLUOROURACIL IV AMB PUMP
2400 mg/m2 | Freq: Once | INTRAVENOUS | 0 refills | Status: CN
Start: 2017-10-08 — End: ?

## 2017-10-08 MED ORDER — FLUOROURACIL IV AMB PUMP
2400 mg/m2 | Freq: Once | INTRAVENOUS | 0 refills | Status: CP
Start: 2017-10-08 — End: ?
  Administered 2017-10-08 (×2): 3960 mg via INTRAVENOUS

## 2017-10-08 MED ORDER — DEXAMETHASONE 6 MG PO TAB
12 mg | Freq: Once | ORAL | 0 refills | Status: CN
Start: 2017-10-08 — End: ?

## 2017-10-08 MED ORDER — ONDANSETRON HCL 8 MG PO TAB
16 mg | Freq: Once | ORAL | 0 refills | Status: CP
Start: 2017-10-08 — End: ?
  Administered 2017-10-08: 15:00:00 16 mg via ORAL

## 2017-10-08 MED ORDER — BEVACIZUMAB IVPB
5 mg/kg | Freq: Once | INTRAVENOUS | 0 refills | Status: CP
Start: 2017-10-08 — End: ?
  Administered 2017-10-08 (×2): 300 mg via INTRAVENOUS

## 2017-10-08 MED ORDER — DEXAMETHASONE 4 MG PO TAB
4 mg | Freq: Once | ORAL | 0 refills | Status: CP
Start: 2017-10-08 — End: ?
  Administered 2017-10-08: 15:00:00 4 mg via ORAL

## 2017-10-08 MED ORDER — SODIUM CHLORIDE 0.9 % IV SOLP
1000 mL | Freq: Once | INTRAVENOUS | 0 refills | Status: CP
Start: 2017-10-08 — End: ?
  Administered 2017-10-08: 14:00:00 1000 mL via INTRAVENOUS

## 2017-10-08 MED ORDER — ONDANSETRON HCL 8 MG PO TAB
16 mg | Freq: Once | ORAL | 0 refills | Status: CN
Start: 2017-10-08 — End: ?

## 2017-10-10 ENCOUNTER — Encounter: Admit: 2017-10-10 | Discharge: 2017-10-10 | Payer: BC Managed Care – PPO

## 2017-10-10 DIAGNOSIS — D630 Anemia in neoplastic disease: ICD-10-CM

## 2017-10-10 DIAGNOSIS — C786 Secondary malignant neoplasm of retroperitoneum and peritoneum: ICD-10-CM

## 2017-10-10 DIAGNOSIS — C801 Malignant (primary) neoplasm, unspecified: ICD-10-CM

## 2017-10-10 DIAGNOSIS — C181 Malignant neoplasm of appendix: ICD-10-CM

## 2017-10-10 DIAGNOSIS — D500 Iron deficiency anemia secondary to blood loss (chronic): Principal | ICD-10-CM

## 2017-10-10 MED ORDER — SODIUM CHLORIDE 0.9 % IV SOLP
1000 mL | Freq: Once | INTRAVENOUS | 0 refills | Status: CP
Start: 2017-10-10 — End: ?
  Administered 2017-10-10: 14:00:00 1000 mL via INTRAVENOUS

## 2017-10-10 MED ORDER — HEPARIN, PORCINE (PF) 100 UNIT/ML IV SYRG
500 [IU] | Freq: Once | 0 refills | Status: CP
Start: 2017-10-10 — End: ?

## 2017-10-19 ENCOUNTER — Encounter: Admit: 2017-10-19 | Discharge: 2017-10-19 | Payer: BC Managed Care – PPO

## 2017-10-22 ENCOUNTER — Encounter: Admit: 2017-10-22 | Discharge: 2017-10-22 | Payer: BC Managed Care – PPO

## 2017-10-22 ENCOUNTER — Encounter: Admit: 2017-10-22 | Discharge: 2017-10-22

## 2017-10-22 DIAGNOSIS — C786 Secondary malignant neoplasm of retroperitoneum and peritoneum: Secondary | ICD-10-CM

## 2017-10-22 DIAGNOSIS — D509 Iron deficiency anemia, unspecified: ICD-10-CM

## 2017-10-22 DIAGNOSIS — G629 Polyneuropathy, unspecified: ICD-10-CM

## 2017-10-22 DIAGNOSIS — N1330 Unspecified hydronephrosis: ICD-10-CM

## 2017-10-22 DIAGNOSIS — C181 Malignant neoplasm of appendix: Principal | ICD-10-CM

## 2017-10-22 DIAGNOSIS — D500 Iron deficiency anemia secondary to blood loss (chronic): ICD-10-CM

## 2017-10-22 DIAGNOSIS — C787 Secondary malignant neoplasm of liver and intrahepatic bile duct: ICD-10-CM

## 2017-10-22 DIAGNOSIS — C7982 Secondary malignant neoplasm of genital organs: ICD-10-CM

## 2017-10-22 DIAGNOSIS — C801 Malignant (primary) neoplasm, unspecified: ICD-10-CM

## 2017-10-22 DIAGNOSIS — C7989 Secondary malignant neoplasm of other specified sites: ICD-10-CM

## 2017-10-22 LAB — COMPREHENSIVE METABOLIC PANEL
Lab: 0.3 mg/dL — ABNORMAL LOW (ref 0.3–1.2)
Lab: 1.1 mg/dL — ABNORMAL HIGH (ref 0.4–1.00)
Lab: 105 MMOL/L — ABNORMAL LOW (ref 98–110)
Lab: 116 mg/dL — ABNORMAL HIGH (ref 70–100)
Lab: 120 U/L — ABNORMAL HIGH (ref 25–110)
Lab: 13 U/L (ref 7–40)
Lab: 134 MMOL/L — ABNORMAL LOW (ref 137–147)
Lab: 19 mg/dL (ref 7–25)
Lab: 23 MMOL/L (ref 21–30)
Lab: 3.5 g/dL (ref 3.5–5.0)
Lab: 4 MMOL/L — ABNORMAL LOW (ref 3.5–5.1)
Lab: 48 mL/min — ABNORMAL LOW (ref >60)
Lab: 58 mL/min — ABNORMAL LOW (ref >60)
Lab: 6 10*3/uL (ref 3–12)
Lab: 7.5 g/dL (ref 6.0–8.0)
Lab: 8 U/L (ref 7–56)
Lab: 8.9 mg/dL — ABNORMAL HIGH (ref 8.5–10.6)

## 2017-10-22 LAB — CBC AND DIFF
Lab: 0 10*3/uL (ref 0–0.20)
Lab: 0.2 10*3/uL (ref 0–0.45)
Lab: 5.8 10*3/uL (ref 4.5–11.0)

## 2017-10-22 LAB — CA125: Lab: 41 U/mL — ABNORMAL HIGH (ref <35)

## 2017-10-22 LAB — CEA(CARCINOEMBRYONIC AG): Lab: 690 ng/mL — ABNORMAL HIGH (ref <3.0)

## 2017-10-22 MED ORDER — DEXAMETHASONE 4 MG PO TAB
4 mg | Freq: Once | ORAL | 0 refills | Status: CP
Start: 2017-10-22 — End: ?
  Administered 2017-10-22: 18:00:00 4 mg via ORAL

## 2017-10-22 MED ORDER — ONDANSETRON HCL 8 MG PO TAB
16 mg | Freq: Once | ORAL | 0 refills | Status: CP
Start: 2017-10-22 — End: ?
  Administered 2017-10-22: 18:00:00 16 mg via ORAL

## 2017-10-22 MED ORDER — FERRIC CARBOXYMALTOSE IVPB
750 mg | Freq: Once | INTRAVENOUS | 0 refills | Status: CP
Start: 2017-10-22 — End: ?
  Administered 2017-10-22 (×2): 750 mg via INTRAVENOUS

## 2017-10-22 MED ORDER — BEVACIZUMAB IVPB
5 mg/kg | Freq: Once | INTRAVENOUS | 0 refills | Status: CP
Start: 2017-10-22 — End: ?
  Administered 2017-10-22 (×2): 300 mg via INTRAVENOUS

## 2017-10-22 MED ORDER — FLUOROURACIL IV AMB PUMP
2400 mg/m2 | Freq: Once | INTRAVENOUS | 0 refills | Status: CP
Start: 2017-10-22 — End: ?
  Administered 2017-10-22 (×2): 3960 mg via INTRAVENOUS

## 2017-10-22 MED ORDER — SODIUM CHLORIDE 0.9 % IV SOLP
500 mL | Freq: Once | INTRAVENOUS | 0 refills | Status: CP
Start: 2017-10-22 — End: ?
  Administered 2017-10-22: 18:00:00 500 mL via INTRAVENOUS

## 2017-10-24 ENCOUNTER — Encounter: Admit: 2017-10-24 | Discharge: 2017-10-24

## 2017-10-24 DIAGNOSIS — C786 Secondary malignant neoplasm of retroperitoneum and peritoneum: ICD-10-CM

## 2017-10-24 DIAGNOSIS — C7982 Secondary malignant neoplasm of genital organs: ICD-10-CM

## 2017-10-24 DIAGNOSIS — C801 Malignant (primary) neoplasm, unspecified: ICD-10-CM

## 2017-10-24 DIAGNOSIS — C181 Malignant neoplasm of appendix: Principal | ICD-10-CM

## 2017-10-24 DIAGNOSIS — C7989 Secondary malignant neoplasm of other specified sites: ICD-10-CM

## 2017-10-24 DIAGNOSIS — C787 Secondary malignant neoplasm of liver and intrahepatic bile duct: ICD-10-CM

## 2017-10-24 MED ORDER — SODIUM CHLORIDE 0.9 % IV SOLP
500 mL | Freq: Once | INTRAVENOUS | 0 refills | Status: CP
Start: 2017-10-24 — End: ?
  Administered 2017-10-24: 18:00:00 500 mL via INTRAVENOUS

## 2017-10-24 MED ORDER — HEPARIN, PORCINE (PF) 100 UNIT/ML IV SYRG
500 [IU] | Freq: Once | 0 refills | Status: CP
Start: 2017-10-24 — End: ?

## 2017-10-31 ENCOUNTER — Encounter: Admit: 2017-10-31 | Discharge: 2017-10-31 | Payer: BC Managed Care – PPO

## 2017-10-31 ENCOUNTER — Encounter: Admit: 2017-10-31 | Discharge: 2017-10-31

## 2017-10-31 DIAGNOSIS — C786 Secondary malignant neoplasm of retroperitoneum and peritoneum: Principal | ICD-10-CM

## 2017-10-31 DIAGNOSIS — C181 Malignant neoplasm of appendix: ICD-10-CM

## 2017-10-31 DIAGNOSIS — C801 Malignant (primary) neoplasm, unspecified: ICD-10-CM

## 2017-10-31 LAB — POC CREATININE, RAD: Lab: 1.2 mg/dL — ABNORMAL HIGH (ref 0.4–1.00)

## 2017-10-31 MED ORDER — FLUOROURACIL IV AMB PUMP
2400 mg/m2 | Freq: Once | INTRAVENOUS | 0 refills | Status: CN
Start: 2017-10-31 — End: ?

## 2017-10-31 MED ORDER — HEPARIN, PORCINE (PF) 100 UNIT/ML IV SYRG
500 [IU] | Freq: Once | 0 refills | Status: CP
Start: 2017-10-31 — End: ?

## 2017-10-31 MED ORDER — IOHEXOL 350 MG IODINE/ML IV SOLN
100 mL | Freq: Once | INTRAVENOUS | 0 refills | Status: CP
Start: 2017-10-31 — End: ?
  Administered 2017-10-31: 20:00:00 100 mL via INTRAVENOUS

## 2017-10-31 MED ORDER — ONDANSETRON HCL 8 MG PO TAB
16 mg | Freq: Once | ORAL | 0 refills | Status: CN
Start: 2017-10-31 — End: ?

## 2017-10-31 MED ORDER — DEXAMETHASONE 4 MG PO TAB
4 mg | Freq: Once | ORAL | 0 refills | Status: CN
Start: 2017-10-31 — End: ?

## 2017-10-31 MED ORDER — SODIUM CHLORIDE 0.9 % IJ SOLN
50 mL | Freq: Once | INTRAVENOUS | 0 refills | Status: CP
Start: 2017-10-31 — End: ?
  Administered 2017-10-31: 20:00:00 50 mL via INTRAVENOUS

## 2017-11-05 ENCOUNTER — Encounter: Admit: 2017-11-05 | Discharge: 2017-11-05 | Payer: BC Managed Care – PPO

## 2017-11-05 ENCOUNTER — Encounter: Admit: 2017-11-05 | Discharge: 2017-11-05

## 2017-11-05 DIAGNOSIS — C786 Secondary malignant neoplasm of retroperitoneum and peritoneum: ICD-10-CM

## 2017-11-05 DIAGNOSIS — D500 Iron deficiency anemia secondary to blood loss (chronic): ICD-10-CM

## 2017-11-05 DIAGNOSIS — C181 Malignant neoplasm of appendix: Principal | ICD-10-CM

## 2017-11-05 DIAGNOSIS — C7989 Secondary malignant neoplasm of other specified sites: ICD-10-CM

## 2017-11-05 DIAGNOSIS — C801 Malignant (primary) neoplasm, unspecified: ICD-10-CM

## 2017-11-05 DIAGNOSIS — C7982 Secondary malignant neoplasm of genital organs: ICD-10-CM

## 2017-11-05 DIAGNOSIS — C787 Secondary malignant neoplasm of liver and intrahepatic bile duct: ICD-10-CM

## 2017-11-05 LAB — COMPREHENSIVE METABOLIC PANEL
Lab: 0.5 mg/dL (ref 0.3–1.2)
Lab: 103 MMOL/L (ref 98–110)
Lab: 131 MMOL/L — ABNORMAL LOW (ref 137–147)
Lab: 170 U/L — ABNORMAL HIGH (ref 25–110)
Lab: 20 mg/dL (ref 7–25)
Lab: 4 MMOL/L — ABNORMAL LOW (ref 3.5–5.1)
Lab: 8.8 mg/dL (ref 8.5–10.6)
Lab: 93 mg/dL (ref 70–100)

## 2017-11-05 LAB — CBC AND DIFF
Lab: 3.3 M/UL — ABNORMAL LOW (ref 4.0–5.0)
Lab: 7.3 10*3/uL (ref 4.5–11.0)

## 2017-11-05 LAB — CEA(CARCINOEMBRYONIC AG): Lab: 791 ng/mL — ABNORMAL HIGH (ref <3.0)

## 2017-11-05 LAB — CA125: Lab: 46 U/mL — ABNORMAL HIGH (ref <35)

## 2017-11-05 MED ORDER — BEVACIZUMAB IVPB
5 mg/kg | Freq: Once | INTRAVENOUS | 0 refills | Status: CP
Start: 2017-11-05 — End: ?
  Administered 2017-11-05 (×2): 300 mg via INTRAVENOUS

## 2017-11-05 MED ORDER — DEXAMETHASONE 4 MG PO TAB
4 mg | Freq: Once | ORAL | 0 refills | Status: CP
Start: 2017-11-05 — End: ?
  Administered 2017-11-05: 17:00:00 4 mg via ORAL

## 2017-11-05 MED ORDER — FERRIC CARBOXYMALTOSE IVPB
750 mg | Freq: Once | INTRAVENOUS | 0 refills | Status: CP
Start: 2017-11-05 — End: ?
  Administered 2017-11-05 (×2): 750 mg via INTRAVENOUS

## 2017-11-05 MED ORDER — ONDANSETRON HCL 8 MG PO TAB
16 mg | Freq: Once | ORAL | 0 refills | Status: CP
Start: 2017-11-05 — End: ?
  Administered 2017-11-05: 17:00:00 16 mg via ORAL

## 2017-11-05 MED ORDER — OXALIPLATIN IVPB
65 mg/m2 | Freq: Once | INTRAVENOUS | 0 refills | Status: CN
Start: 2017-11-05 — End: ?

## 2017-11-05 MED ORDER — OXALIPLATIN IVPB
65 mg/m2 | Freq: Once | INTRAVENOUS | 0 refills | Status: CP
Start: 2017-11-05 — End: ?
  Administered 2017-11-05 (×3): 107.25 mg via INTRAVENOUS

## 2017-11-05 MED ORDER — FLUOROURACIL IV AMB PUMP
2400 mg/m2 | Freq: Once | INTRAVENOUS | 0 refills | Status: CP
Start: 2017-11-05 — End: ?
  Administered 2017-11-05 (×2): 3960 mg via INTRAVENOUS

## 2017-11-05 MED ORDER — SODIUM CHLORIDE 0.9 % IV SOLP
500 mL | Freq: Once | INTRAVENOUS | 0 refills | Status: CP
Start: 2017-11-05 — End: ?
  Administered 2017-11-05: 17:00:00 500 mL via INTRAVENOUS

## 2017-11-07 ENCOUNTER — Encounter: Admit: 2017-11-07 | Discharge: 2017-11-07 | Payer: BC Managed Care – PPO

## 2017-11-07 ENCOUNTER — Encounter: Admit: 2017-11-07 | Discharge: 2017-11-07

## 2017-11-07 DIAGNOSIS — C181 Malignant neoplasm of appendix: Principal | ICD-10-CM

## 2017-11-07 DIAGNOSIS — C786 Secondary malignant neoplasm of retroperitoneum and peritoneum: ICD-10-CM

## 2017-11-07 DIAGNOSIS — C801 Malignant (primary) neoplasm, unspecified: ICD-10-CM

## 2017-11-07 MED ORDER — HEPARIN, PORCINE (PF) 100 UNIT/ML IV SYRG
500 [IU] | Freq: Once | 0 refills | Status: CP
Start: 2017-11-07 — End: ?

## 2017-11-07 MED ORDER — SODIUM CHLORIDE 0.9 % IV SOLP
500 mL | Freq: Once | INTRAVENOUS | 0 refills | Status: CP
Start: 2017-11-07 — End: ?
  Administered 2017-11-07: 19:00:00 500 mL via INTRAVENOUS

## 2017-11-12 ENCOUNTER — Encounter: Admit: 2017-11-12 | Discharge: 2017-11-12 | Payer: BC Managed Care – PPO

## 2017-11-12 DIAGNOSIS — C181 Malignant neoplasm of appendix: Principal | ICD-10-CM

## 2017-11-12 DIAGNOSIS — C801 Malignant (primary) neoplasm, unspecified: ICD-10-CM

## 2017-11-15 ENCOUNTER — Encounter: Admit: 2017-11-15 | Discharge: 2017-11-15 | Payer: BC Managed Care – PPO

## 2017-11-15 DIAGNOSIS — C181 Malignant neoplasm of appendix: Principal | ICD-10-CM

## 2017-11-15 DIAGNOSIS — C801 Malignant (primary) neoplasm, unspecified: ICD-10-CM

## 2017-11-21 ENCOUNTER — Encounter: Admit: 2017-11-21 | Discharge: 2017-11-21 | Payer: BC Managed Care – PPO

## 2017-11-21 ENCOUNTER — Encounter: Admit: 2017-11-21 | Discharge: 2017-11-21

## 2017-11-21 DIAGNOSIS — C786 Secondary malignant neoplasm of retroperitoneum and peritoneum: ICD-10-CM

## 2017-11-21 DIAGNOSIS — C7982 Secondary malignant neoplasm of genital organs: ICD-10-CM

## 2017-11-21 DIAGNOSIS — C181 Malignant neoplasm of appendix: Principal | ICD-10-CM

## 2017-11-21 DIAGNOSIS — C801 Malignant (primary) neoplasm, unspecified: ICD-10-CM

## 2017-11-21 DIAGNOSIS — C787 Secondary malignant neoplasm of liver and intrahepatic bile duct: ICD-10-CM

## 2017-11-21 DIAGNOSIS — C7989 Secondary malignant neoplasm of other specified sites: ICD-10-CM

## 2017-11-21 LAB — COMPREHENSIVE METABOLIC PANEL
Lab: 0.3 mg/dL — ABNORMAL LOW (ref 0.3–1.2)
Lab: 1.2 mg/dL — ABNORMAL HIGH (ref 0.4–1.00)
Lab: 20 MMOL/L — ABNORMAL LOW (ref 21–30)
Lab: 23 mg/dL (ref 7–25)
Lab: 3.5 g/dL (ref 3.5–5.0)
Lab: 42 mL/min — ABNORMAL LOW (ref >60)
Lab: 51 mL/min — ABNORMAL LOW (ref >60)
Lab: 7 10*3/uL (ref 3–12)
Lab: 7.5 g/dL (ref 6.0–8.0)
Lab: 8 U/L (ref 7–56)
Lab: 8.9 mg/dL — ABNORMAL HIGH (ref 8.5–10.6)
Lab: 92 mg/dL (ref 70–100)
Lab: 98 U/L — ABNORMAL LOW (ref 25–110)

## 2017-11-21 LAB — CBC AND DIFF
Lab: 0 10*3/uL (ref 0–0.20)
Lab: 0.2 10*3/uL (ref 0–0.45)

## 2017-11-21 MED ORDER — ONDANSETRON HCL 8 MG PO TAB
16 mg | Freq: Once | ORAL | 0 refills | Status: CP
Start: 2017-11-21 — End: ?
  Administered 2017-11-21: 15:00:00 16 mg via ORAL

## 2017-11-21 MED ORDER — FLUOROURACIL IV AMB PUMP
2400 mg/m2 | Freq: Once | INTRAVENOUS | 0 refills | Status: CP
Start: 2017-11-21 — End: ?
  Administered 2017-11-21 (×2): 3960 mg via INTRAVENOUS

## 2017-11-21 MED ORDER — OXALIPLATIN IVPB
65 mg/m2 | Freq: Once | INTRAVENOUS | 0 refills | Status: CN
Start: 2017-11-21 — End: ?

## 2017-11-21 MED ORDER — OXALIPLATIN IVPB
65 mg/m2 | Freq: Once | INTRAVENOUS | 0 refills | Status: CP
Start: 2017-11-21 — End: ?
  Administered 2017-11-21 (×3): 107.25 mg via INTRAVENOUS

## 2017-11-21 MED ORDER — SODIUM CHLORIDE 0.9 % IV SOLP
500 mL | Freq: Once | INTRAVENOUS | 0 refills | Status: CP
Start: 2017-11-21 — End: ?
  Administered 2017-11-21: 15:00:00 500 mL via INTRAVENOUS

## 2017-11-21 MED ORDER — DEXAMETHASONE 4 MG PO TAB
4 mg | Freq: Once | ORAL | 0 refills | Status: CP
Start: 2017-11-21 — End: ?
  Administered 2017-11-21: 15:00:00 4 mg via ORAL

## 2017-11-21 MED ORDER — BEVACIZUMAB IVPB
5 mg/kg | Freq: Once | INTRAVENOUS | 0 refills | Status: CP
Start: 2017-11-21 — End: ?
  Administered 2017-11-21 (×2): 300 mg via INTRAVENOUS

## 2017-11-23 ENCOUNTER — Encounter: Admit: 2017-11-23 | Discharge: 2017-11-23

## 2017-11-23 DIAGNOSIS — C801 Malignant (primary) neoplasm, unspecified: ICD-10-CM

## 2017-11-23 DIAGNOSIS — C786 Secondary malignant neoplasm of retroperitoneum and peritoneum: ICD-10-CM

## 2017-11-23 DIAGNOSIS — C7982 Secondary malignant neoplasm of genital organs: ICD-10-CM

## 2017-11-23 DIAGNOSIS — C7989 Secondary malignant neoplasm of other specified sites: ICD-10-CM

## 2017-11-23 DIAGNOSIS — C181 Malignant neoplasm of appendix: Secondary | ICD-10-CM

## 2017-11-23 DIAGNOSIS — C787 Secondary malignant neoplasm of liver and intrahepatic bile duct: ICD-10-CM

## 2017-11-23 DIAGNOSIS — E43 Unspecified severe protein-calorie malnutrition: ICD-10-CM

## 2017-11-23 MED ORDER — HEPARIN, PORCINE (PF) 100 UNIT/ML IV SYRG
500 [IU] | Freq: Once | 0 refills | Status: CP
Start: 2017-11-23 — End: ?

## 2017-11-23 MED ORDER — SODIUM CHLORIDE 0.9 % IV SOLP
500 mL | Freq: Once | INTRAVENOUS | 0 refills | Status: CP
Start: 2017-11-23 — End: ?
  Administered 2017-11-23: 17:00:00 500 mL via INTRAVENOUS

## 2017-11-28 ENCOUNTER — Encounter: Admit: 2017-11-28 | Discharge: 2017-11-28 | Payer: BC Managed Care – PPO

## 2017-11-29 ENCOUNTER — Encounter: Admit: 2017-11-29 | Discharge: 2017-11-29 | Payer: BC Managed Care – PPO

## 2017-11-29 MED ORDER — OXALIPLATIN IVPB
65 mg/m2 | Freq: Once | INTRAVENOUS | 0 refills | Status: CN
Start: 2017-11-29 — End: ?

## 2017-11-30 ENCOUNTER — Encounter: Admit: 2017-11-30 | Discharge: 2017-11-30 | Payer: BC Managed Care – PPO

## 2017-11-30 MED ORDER — FLUOROURACIL IV AMB PUMP
2400 mg/m2 | Freq: Once | INTRAVENOUS | 0 refills | Status: CN
Start: 2017-11-30 — End: ?

## 2017-12-03 ENCOUNTER — Encounter: Admit: 2017-12-03 | Discharge: 2017-12-03 | Payer: BC Managed Care – PPO

## 2017-12-03 ENCOUNTER — Encounter: Admit: 2017-12-03 | Discharge: 2017-12-03

## 2017-12-03 ENCOUNTER — Encounter: Admit: 2017-12-03 | Discharge: 2017-12-04 | Payer: BC Managed Care – PPO

## 2017-12-03 DIAGNOSIS — K56609 Unspecified intestinal obstruction, unspecified as to partial versus complete obstruction: ICD-10-CM

## 2017-12-03 DIAGNOSIS — C181 Malignant neoplasm of appendix: Principal | ICD-10-CM

## 2017-12-03 DIAGNOSIS — Z932 Ileostomy status: ICD-10-CM

## 2017-12-03 DIAGNOSIS — Z006 Encounter for examination for normal comparison and control in clinical research program: ICD-10-CM

## 2017-12-03 DIAGNOSIS — C786 Secondary malignant neoplasm of retroperitoneum and peritoneum: ICD-10-CM

## 2017-12-03 DIAGNOSIS — D508 Other iron deficiency anemias: ICD-10-CM

## 2017-12-03 DIAGNOSIS — G629 Polyneuropathy, unspecified: ICD-10-CM

## 2017-12-03 DIAGNOSIS — C801 Malignant (primary) neoplasm, unspecified: ICD-10-CM

## 2017-12-03 LAB — CBC AND DIFF
Lab: 0 % (ref 0–2)
Lab: 0 10*3/uL (ref 0–0.20)
Lab: 0.2 10*3/uL (ref 0–0.45)
Lab: 0.5 10*3/uL (ref 0–0.80)
Lab: 0.8 10*3/uL — ABNORMAL LOW (ref 1.0–4.8)
Lab: 10 g/dL — ABNORMAL LOW (ref 12.0–15.0)
Lab: 11 % — ABNORMAL LOW (ref 24–44)
Lab: 18 % — ABNORMAL HIGH (ref 11–15)
Lab: 296 10*3/uL (ref 150–400)
Lab: 3 % (ref 0–5)
Lab: 3.2 M/UL — ABNORMAL LOW (ref 4.0–5.0)
Lab: 31 % — ABNORMAL LOW (ref 36–45)
Lab: 33 pg (ref 26–34)
Lab: 34 g/dL (ref 32.0–36.0)
Lab: 5.5 10*3/uL (ref 1.8–7.0)
Lab: 6.5 FL — ABNORMAL LOW (ref 7–11)
Lab: 7 % (ref 4–12)
Lab: 7 K/UL (ref 4.5–11.0)
Lab: 79 % — ABNORMAL HIGH (ref 41–77)
Lab: 95 FL (ref 80–100)

## 2017-12-04 ENCOUNTER — Encounter: Admit: 2017-12-04 | Discharge: 2017-12-04 | Payer: BC Managed Care – PPO

## 2017-12-04 LAB — HEPATITIS B QNT PLASMA

## 2017-12-04 LAB — PHOSPHORUS: Lab: 3.3 mg/dL — ABNORMAL HIGH (ref 2.0–4.5)

## 2017-12-04 LAB — COMPREHENSIVE METABOLIC PANEL
Lab: 0.3 mg/dL (ref 0.3–1.2)
Lab: 10 U/L (ref 7–56)
Lab: 107 mg/dL — ABNORMAL HIGH (ref 70–100)
Lab: 110 U/L (ref 25–110)
Lab: 135 MMOL/L — ABNORMAL LOW (ref 137–147)
Lab: 18 mg/dL (ref 7–25)
Lab: 19 U/L (ref 7–40)
Lab: 24 MMOL/L (ref 21–30)
Lab: 3.9 g/dL (ref 3.5–5.0)
Lab: 4.1 MMOL/L (ref 3.5–5.1)
Lab: 43 mL/min — ABNORMAL LOW (ref >60)
Lab: 51 mL/min — ABNORMAL LOW (ref >60)
Lab: 8.4 g/dL — ABNORMAL HIGH (ref 6.0–8.0)
Lab: 9 — AB (ref 3–12)
Lab: 9.3 mg/dL (ref 8.5–10.6)

## 2017-12-04 LAB — LIPID PROFILE
Lab: 111 mg/dL — ABNORMAL HIGH (ref <100)
Lab: 202 mg/dL — ABNORMAL HIGH (ref <200)
Lab: 37 mg/dL — ABNORMAL LOW (ref >40)
Lab: 551 mg/dL — ABNORMAL HIGH (ref <150)

## 2017-12-04 LAB — ACTH: Lab: 16 pg/mL (ref 7–63)

## 2017-12-04 LAB — FREE T4 (FREE THYROXINE) ONLY: Lab: 1 ng/dL (ref 0.6–1.6)

## 2017-12-04 LAB — LDH-LACTATE DEHYDROGENASE: Lab: 242 U/L — ABNORMAL HIGH (ref 100–210)

## 2017-12-04 LAB — LIPASE: Lab: 112 U/L — ABNORMAL HIGH (ref 11–82)

## 2017-12-04 LAB — HEPATITIS C VIRAL LOAD PCR QUANT

## 2017-12-04 LAB — THYROID STIMULATING HORMONE-TSH: Lab: 6.7 uU/mL — ABNORMAL HIGH (ref 0.35–5.00)

## 2017-12-04 LAB — TOTAL T3 (TRIIODOTHYRONINE): Lab: 89 ng/dL (ref 87–180)

## 2017-12-04 LAB — PTT (APTT): Lab: 28 s (ref 24.0–36.5)

## 2017-12-04 LAB — TESTOSTERONE,TOTAL: Lab: 112 ng/dL — ABNORMAL HIGH (ref 6–86)

## 2017-12-04 LAB — CORTISOL,RANDOM: Lab: 6.7 ug/dL (ref 5.0–20.0)

## 2017-12-04 LAB — PROTIME INR (PT): Lab: 1 (ref 0.8–1.2)

## 2017-12-04 LAB — AMYLASE: Lab: 210 U/L — ABNORMAL HIGH (ref 24–100)

## 2017-12-04 LAB — PROLACTIN: Lab: 16 ng/mL (ref 3.3–26.7)

## 2017-12-04 LAB — HEPATITIS B SURFACE AG: Lab: NEGATIVE

## 2017-12-11 ENCOUNTER — Encounter: Admit: 2017-12-11 | Discharge: 2017-12-11 | Payer: BC Managed Care – PPO

## 2017-12-11 DIAGNOSIS — Z006 Encounter for examination for normal comparison and control in clinical research program: Principal | ICD-10-CM

## 2017-12-12 ENCOUNTER — Encounter: Admit: 2017-12-12 | Discharge: 2017-12-12 | Payer: BC Managed Care – PPO

## 2017-12-12 DIAGNOSIS — C181 Malignant neoplasm of appendix: ICD-10-CM

## 2017-12-12 DIAGNOSIS — Z006 Encounter for examination for normal comparison and control in clinical research program: Principal | ICD-10-CM

## 2017-12-14 ENCOUNTER — Ambulatory Visit: Admit: 2017-12-14 | Discharge: 2017-12-14

## 2017-12-14 DIAGNOSIS — C786 Secondary malignant neoplasm of retroperitoneum and peritoneum: ICD-10-CM

## 2017-12-14 DIAGNOSIS — Z006 Encounter for examination for normal comparison and control in clinical research program: ICD-10-CM

## 2017-12-14 DIAGNOSIS — C181 Malignant neoplasm of appendix: Principal | ICD-10-CM

## 2017-12-14 DIAGNOSIS — C801 Malignant (primary) neoplasm, unspecified: ICD-10-CM

## 2017-12-14 MED ORDER — IOHEXOL 350 MG IODINE/ML IV SOLN
100 mL | Freq: Once | INTRAVENOUS | 0 refills | Status: CP
Start: 2017-12-14 — End: ?
  Administered 2017-12-14: 15:00:00 100 mL via INTRAVENOUS

## 2017-12-14 MED ORDER — SODIUM CHLORIDE 0.9 % IJ SOLN
50 mL | Freq: Once | INTRAVENOUS | 0 refills | Status: CP
Start: 2017-12-14 — End: ?
  Administered 2017-12-14: 15:00:00 50 mL via INTRAVENOUS

## 2017-12-17 ENCOUNTER — Ambulatory Visit: Admit: 2017-12-17 | Discharge: 2017-12-17 | Payer: BC Managed Care – PPO

## 2017-12-17 ENCOUNTER — Encounter: Admit: 2017-12-17 | Discharge: 2017-12-17 | Payer: BC Managed Care – PPO

## 2017-12-17 ENCOUNTER — Encounter: Admit: 2017-12-17 | Discharge: 2017-12-17

## 2017-12-17 DIAGNOSIS — R7989 Other specified abnormal findings of blood chemistry: ICD-10-CM

## 2017-12-17 DIAGNOSIS — C786 Secondary malignant neoplasm of retroperitoneum and peritoneum: ICD-10-CM

## 2017-12-17 DIAGNOSIS — C181 Malignant neoplasm of appendix: Principal | ICD-10-CM

## 2017-12-17 DIAGNOSIS — Z006 Encounter for examination for normal comparison and control in clinical research program: ICD-10-CM

## 2017-12-17 DIAGNOSIS — C801 Malignant (primary) neoplasm, unspecified: ICD-10-CM

## 2017-12-17 DIAGNOSIS — N1330 Unspecified hydronephrosis: ICD-10-CM

## 2017-12-17 LAB — LIPID PROFILE
Lab: 105 mg/dL — ABNORMAL HIGH (ref <100)
Lab: 146 mg/dL — ABNORMAL LOW (ref 0.3–1.2)
Lab: 191 mg/dL — ABNORMAL HIGH (ref <200)
Lab: 296 mg/dL — ABNORMAL HIGH (ref <150)
Lab: 45 mg/dL — ABNORMAL HIGH (ref >40)
Lab: 59 mg/dL (ref 6.0–8.0)

## 2017-12-17 LAB — CBC AND DIFF
Lab: 0 10*3/uL (ref 0–0.20)
Lab: 0.2 10*3/uL (ref 0–0.45)
Lab: 5.1 10*3/uL (ref 4.5–11.0)

## 2017-12-17 LAB — PHOSPHORUS: Lab: 3.6 mg/dL — ABNORMAL LOW (ref 2.0–4.5)

## 2017-12-17 LAB — COMPREHENSIVE METABOLIC PANEL
Lab: 132 MMOL/L — ABNORMAL LOW (ref 137–147)
Lab: 14 U/L (ref 7–40)
Lab: 23 MMOL/L (ref 21–30)
Lab: 3.5 g/dL (ref 3.5–5.0)
Lab: 36 mL/min — ABNORMAL LOW (ref >60)
Lab: 6 U/L — ABNORMAL LOW (ref 7–56)
Lab: 85 U/L — ABNORMAL LOW (ref 25–110)

## 2017-12-17 LAB — RESEARCH BLOOD COLLECTION ONLY

## 2017-12-17 LAB — LDH-LACTATE DEHYDROGENASE: Lab: 130 U/L — ABNORMAL LOW (ref 100–210)

## 2017-12-17 MED ORDER — (INV) SIROLIMUS 1MG TAB
ORAL_TABLET | Freq: Every day | 0 refills | Status: AC
Start: 2017-12-17 — End: 2017-12-24

## 2017-12-18 ENCOUNTER — Encounter: Admit: 2017-12-18 | Discharge: 2017-12-18 | Payer: BC Managed Care – PPO

## 2017-12-19 ENCOUNTER — Encounter: Admit: 2017-12-19 | Discharge: 2017-12-19 | Payer: BC Managed Care – PPO

## 2017-12-19 DIAGNOSIS — C181 Malignant neoplasm of appendix: Principal | ICD-10-CM

## 2017-12-19 DIAGNOSIS — C801 Malignant (primary) neoplasm, unspecified: ICD-10-CM

## 2017-12-21 ENCOUNTER — Encounter: Admit: 2017-12-21 | Discharge: 2017-12-21 | Payer: BC Managed Care – PPO

## 2017-12-24 ENCOUNTER — Encounter: Admit: 2017-12-24 | Discharge: 2017-12-24 | Payer: BC Managed Care – PPO

## 2017-12-24 ENCOUNTER — Encounter: Admit: 2017-12-24 | Discharge: 2017-12-25 | Payer: BC Managed Care – PPO

## 2017-12-24 ENCOUNTER — Encounter: Admit: 2017-12-24 | Discharge: 2017-12-24

## 2017-12-24 DIAGNOSIS — C786 Secondary malignant neoplasm of retroperitoneum and peritoneum: ICD-10-CM

## 2017-12-24 DIAGNOSIS — C7989 Secondary malignant neoplasm of other specified sites: ICD-10-CM

## 2017-12-24 DIAGNOSIS — C801 Malignant (primary) neoplasm, unspecified: ICD-10-CM

## 2017-12-24 DIAGNOSIS — C181 Malignant neoplasm of appendix: Principal | ICD-10-CM

## 2017-12-24 DIAGNOSIS — C7982 Secondary malignant neoplasm of genital organs: ICD-10-CM

## 2017-12-24 DIAGNOSIS — C787 Secondary malignant neoplasm of liver and intrahepatic bile duct: ICD-10-CM

## 2017-12-24 DIAGNOSIS — Z006 Encounter for examination for normal comparison and control in clinical research program: ICD-10-CM

## 2017-12-24 LAB — LIPASE: Lab: 67 U/L (ref 11–82)

## 2017-12-24 LAB — AMYLASE: Lab: 173 U/L — ABNORMAL HIGH (ref 24–100)

## 2017-12-24 LAB — COMPREHENSIVE METABOLIC PANEL
Lab: 0.4 mg/dL (ref 0.3–1.2)
Lab: 100 mg/dL (ref >60)
Lab: 132 MMOL/L — ABNORMAL LOW (ref 137–147)
Lab: 16 U/L (ref 7–40)
Lab: 27 MMOL/L (ref 21–30)
Lab: 27 mL/min — ABNORMAL LOW (ref >60)
Lab: 3.5 g/dL — ABNORMAL LOW (ref 3.5–5.0)
Lab: 33 mL/min — ABNORMAL LOW (ref >60)
Lab: 33 mg/dL — ABNORMAL HIGH (ref 7–25)
Lab: 4 MMOL/L — ABNORMAL LOW (ref 3.5–5.1)
Lab: 7 U/L (ref 7–56)
Lab: 7.9 g/dL — ABNORMAL LOW (ref 6.0–8.0)
Lab: 83 U/L (ref 25–110)
Lab: 9 K/UL — ABNORMAL LOW (ref 3–12)
Lab: 9.5 mg/dL (ref 8.5–10.6)
Lab: 96 MMOL/L — ABNORMAL LOW (ref >60)

## 2017-12-24 LAB — CBC AND DIFF
Lab: 0 10*3/uL (ref 0–0.20)
Lab: 3.2 M/UL — ABNORMAL LOW (ref 4.0–5.0)
Lab: 5.8 K/UL — ABNORMAL LOW (ref 4.5–11.0)

## 2017-12-24 MED ORDER — (INV) EPACADOSTAT (INCB024360) 100MG TAB
300 mg | ORAL_TABLET | Freq: Two times a day (BID) | ORAL | 0 refills | Status: AC
Start: 2017-12-24 — End: 2018-01-21

## 2017-12-24 MED ORDER — (INV) SIROLIMUS 1MG TAB
2 mg | ORAL_TABLET | Freq: Every day | ORAL | 0 refills | Status: AC
Start: 2017-12-24 — End: 2018-01-21

## 2017-12-24 MED ORDER — HEPARIN, PORCINE (PF) 100 UNIT/ML IV SYRG
500 [IU] | Freq: Once | 0 refills | Status: CP
Start: 2017-12-24 — End: ?

## 2017-12-24 MED ORDER — SODIUM CHLORIDE 0.9 % IV SOLP
1000 mL | Freq: Once | INTRAVENOUS | 0 refills | Status: CP
Start: 2017-12-24 — End: ?
  Administered 2017-12-24: 15:00:00 1000 mL via INTRAVENOUS

## 2017-12-26 ENCOUNTER — Encounter: Admit: 2017-12-26 | Discharge: 2017-12-26 | Payer: BC Managed Care – PPO

## 2017-12-31 ENCOUNTER — Encounter: Admit: 2017-12-31 | Discharge: 2017-12-31 | Payer: BC Managed Care – PPO

## 2017-12-31 ENCOUNTER — Encounter: Admit: 2017-12-31 | Discharge: 2017-12-31

## 2017-12-31 ENCOUNTER — Encounter: Admit: 2017-12-31 | Discharge: 2018-01-01 | Payer: BC Managed Care – PPO

## 2017-12-31 DIAGNOSIS — Z006 Encounter for examination for normal comparison and control in clinical research program: ICD-10-CM

## 2017-12-31 DIAGNOSIS — C786 Secondary malignant neoplasm of retroperitoneum and peritoneum: ICD-10-CM

## 2017-12-31 DIAGNOSIS — R7989 Other specified abnormal findings of blood chemistry: ICD-10-CM

## 2017-12-31 DIAGNOSIS — C181 Malignant neoplasm of appendix: Principal | ICD-10-CM

## 2017-12-31 DIAGNOSIS — Z23 Encounter for immunization: ICD-10-CM

## 2017-12-31 DIAGNOSIS — N1330 Unspecified hydronephrosis: ICD-10-CM

## 2017-12-31 DIAGNOSIS — C801 Malignant (primary) neoplasm, unspecified: ICD-10-CM

## 2017-12-31 LAB — COMPREHENSIVE METABOLIC PANEL
Lab: 1.4 mg/dL — ABNORMAL HIGH (ref 0.4–1.00)
Lab: 108 mg/dL — ABNORMAL HIGH (ref 70–100)
Lab: 12 10*3/uL (ref 3–12)
Lab: 133 MMOL/L — ABNORMAL LOW (ref 137–147)
Lab: 21 U/L (ref 7–40)
Lab: 24 MMOL/L (ref 21–30)
Lab: 24 mg/dL (ref 7–25)
Lab: 3.7 g/dL (ref 3.5–5.0)
Lab: 36 mL/min — ABNORMAL LOW (ref >60)
Lab: 4 MMOL/L — ABNORMAL LOW (ref 3.5–5.1)
Lab: 44 mL/min — ABNORMAL LOW (ref >60)
Lab: 8 U/L (ref 7–56)
Lab: 89 U/L — ABNORMAL LOW (ref 25–110)
Lab: 9.5 mg/dL — ABNORMAL HIGH (ref 8.5–10.6)
Lab: 97 MMOL/L — ABNORMAL LOW (ref 98–110)

## 2017-12-31 LAB — CBC AND DIFF
Lab: 0 10*3/uL (ref 0–0.20)
Lab: 0.2 10*3/uL (ref 0–0.45)
Lab: 5.8 10*3/uL (ref 4.5–11.0)

## 2017-12-31 MED ORDER — FLU VACC QS2019-20 6MOS UP(PF) 60 MCG (15 MCG X 4)/0.5 ML IM SYRG
.5 mL | Freq: Once | INTRAMUSCULAR | 0 refills | Status: CP
Start: 2017-12-31 — End: ?
  Administered 2017-12-31: 17:00:00 0.5 mL via INTRAMUSCULAR

## 2017-12-31 MED ORDER — FUROSEMIDE 10 MG/ML IJ SOLN
40 mg | Freq: Once | INTRAVENOUS | 0 refills | Status: CP
Start: 2017-12-31 — End: ?
  Administered 2017-12-31: 15:00:00 31 mg via INTRAVENOUS

## 2017-12-31 MED ORDER — HEPARIN, PORCINE (PF) 100 UNIT/ML IV SYRG
500 [IU] | Freq: Once | 0 refills | Status: CP
Start: 2017-12-31 — End: ?

## 2017-12-31 MED ORDER — RP DX TC-99M MERTIATIDE MCI
5 | Freq: Once | INTRAVENOUS | 0 refills | Status: CP
Start: 2017-12-31 — End: ?
  Administered 2017-12-31: 14:00:00 5.1 via INTRAVENOUS

## 2018-01-01 ENCOUNTER — Encounter: Admit: 2018-01-01 | Discharge: 2018-01-01 | Payer: BC Managed Care – PPO

## 2018-01-01 DIAGNOSIS — C181 Malignant neoplasm of appendix: Principal | ICD-10-CM

## 2018-01-01 DIAGNOSIS — C801 Malignant (primary) neoplasm, unspecified: ICD-10-CM

## 2018-01-03 ENCOUNTER — Encounter: Admit: 2018-01-03 | Discharge: 2018-01-03 | Payer: BC Managed Care – PPO

## 2018-01-03 DIAGNOSIS — N135 Crossing vessel and stricture of ureter without hydronephrosis: Principal | ICD-10-CM

## 2018-01-04 DIAGNOSIS — N135 Crossing vessel and stricture of ureter without hydronephrosis: ICD-10-CM

## 2018-01-07 ENCOUNTER — Encounter: Admit: 2018-01-07 | Discharge: 2018-01-07 | Payer: BC Managed Care – PPO

## 2018-01-07 ENCOUNTER — Encounter: Admit: 2018-01-07 | Discharge: 2018-01-07

## 2018-01-07 DIAGNOSIS — C7989 Secondary malignant neoplasm of other specified sites: ICD-10-CM

## 2018-01-07 DIAGNOSIS — C786 Secondary malignant neoplasm of retroperitoneum and peritoneum: ICD-10-CM

## 2018-01-07 DIAGNOSIS — C7982 Secondary malignant neoplasm of genital organs: ICD-10-CM

## 2018-01-07 DIAGNOSIS — C801 Malignant (primary) neoplasm, unspecified: ICD-10-CM

## 2018-01-07 DIAGNOSIS — C787 Secondary malignant neoplasm of liver and intrahepatic bile duct: ICD-10-CM

## 2018-01-07 DIAGNOSIS — Z006 Encounter for examination for normal comparison and control in clinical research program: ICD-10-CM

## 2018-01-07 DIAGNOSIS — C181 Malignant neoplasm of appendix: Principal | ICD-10-CM

## 2018-01-07 LAB — COMPREHENSIVE METABOLIC PANEL
Lab: 0.3 mg/dL — ABNORMAL LOW (ref 0.3–1.2)
Lab: 1.2 mg/dL — ABNORMAL HIGH (ref >60)
Lab: 104 U/L — ABNORMAL LOW (ref 25–110)
Lab: 12 10*3/uL (ref 3–12)
Lab: 15 U/L (ref 7–40)
Lab: 23 MMOL/L (ref 21–30)
Lab: 26 mg/dL — ABNORMAL HIGH (ref >60)
Lab: 3.5 g/dL (ref 3.5–5.0)
Lab: 43 mL/min — ABNORMAL LOW (ref >60)
Lab: 52 mL/min — ABNORMAL LOW (ref >60)
Lab: 8 U/L (ref 7–56)
Lab: 8.4 g/dL — ABNORMAL HIGH (ref 6.0–8.0)
Lab: 9.2 mg/dL (ref 8.5–10.6)
Lab: 91 mg/dL — ABNORMAL HIGH (ref 70–100)
Lab: 95 MMOL/L — ABNORMAL LOW (ref 98–110)

## 2018-01-07 LAB — CBC AND DIFF
Lab: 0 10*3/uL (ref 0–0.20)
Lab: 0.1 10*3/uL (ref 0–0.45)

## 2018-01-07 LAB — RESEARCH BLOOD COLLECTION ONLY

## 2018-01-07 MED ORDER — HEPARIN, PORCINE (PF) 100 UNIT/ML IV SYRG
500 [IU] | Freq: Once | 0 refills | Status: CP
Start: 2018-01-07 — End: ?

## 2018-01-15 ENCOUNTER — Encounter: Admit: 2018-01-15 | Discharge: 2018-01-15 | Payer: BC Managed Care – PPO

## 2018-01-15 DIAGNOSIS — C181 Malignant neoplasm of appendix: Principal | ICD-10-CM

## 2018-01-15 DIAGNOSIS — C801 Malignant (primary) neoplasm, unspecified: ICD-10-CM

## 2018-01-17 ENCOUNTER — Encounter: Admit: 2018-01-17 | Discharge: 2018-01-17 | Payer: BC Managed Care – PPO

## 2018-01-21 ENCOUNTER — Encounter: Admit: 2018-01-21 | Discharge: 2018-01-21 | Payer: BC Managed Care – PPO

## 2018-01-21 ENCOUNTER — Encounter: Admit: 2018-01-21 | Discharge: 2018-01-21

## 2018-01-21 ENCOUNTER — Encounter: Admit: 2018-01-21 | Discharge: 2018-01-22 | Payer: BC Managed Care – PPO

## 2018-01-21 DIAGNOSIS — G629 Polyneuropathy, unspecified: ICD-10-CM

## 2018-01-21 DIAGNOSIS — C801 Malignant (primary) neoplasm, unspecified: ICD-10-CM

## 2018-01-21 DIAGNOSIS — D509 Iron deficiency anemia, unspecified: ICD-10-CM

## 2018-01-21 DIAGNOSIS — C786 Secondary malignant neoplasm of retroperitoneum and peritoneum: ICD-10-CM

## 2018-01-21 DIAGNOSIS — C181 Malignant neoplasm of appendix: Principal | ICD-10-CM

## 2018-01-21 DIAGNOSIS — Z006 Encounter for examination for normal comparison and control in clinical research program: ICD-10-CM

## 2018-01-21 DIAGNOSIS — N1330 Unspecified hydronephrosis: ICD-10-CM

## 2018-01-21 LAB — LIPID PROFILE
Lab: 217 mg/dL — ABNORMAL HIGH (ref <200)
Lab: 329 mg/dL — ABNORMAL HIGH (ref <150)
Lab: 45 mg/dL — ABNORMAL HIGH (ref >40)

## 2018-01-21 LAB — CBC AND DIFF
Lab: 29 % — ABNORMAL LOW (ref 36–45)
Lab: 3.2 M/UL — ABNORMAL LOW (ref 4.0–5.0)
Lab: 9.8 g/dL — ABNORMAL LOW (ref 12.0–15.0)

## 2018-01-21 LAB — THYROID STIMULATING HORMONE-TSH: Lab: 3.6 uU/mL — ABNORMAL HIGH (ref <100)

## 2018-01-21 LAB — CA125: Lab: 41 U/mL — ABNORMAL HIGH (ref <35)

## 2018-01-21 MED ORDER — HEPARIN, PORCINE (PF) 100 UNIT/ML IV SYRG
500 [IU] | Freq: Once | 0 refills | Status: CP
Start: 2018-01-21 — End: ?

## 2018-01-21 MED ORDER — (INV) SIROLIMUS 1MG TAB
2 mg | ORAL_TABLET | Freq: Every day | ORAL | 0 refills | Status: AC
Start: 2018-01-21 — End: 2018-02-18

## 2018-01-21 MED ORDER — (INV) EPACADOSTAT (INCB024360) 100MG TAB
300 mg | ORAL_TABLET | Freq: Two times a day (BID) | ORAL | 0 refills | Status: AC
Start: 2018-01-21 — End: 2018-02-18

## 2018-01-22 LAB — CEA(CARCINOEMBRYONIC AG): Lab: 126 ng/mL — ABNORMAL HIGH (ref <3.0)

## 2018-01-23 ENCOUNTER — Encounter: Admit: 2018-01-23 | Discharge: 2018-01-23 | Payer: BC Managed Care – PPO

## 2018-02-04 ENCOUNTER — Encounter: Admit: 2018-02-04 | Discharge: 2018-02-04 | Payer: BC Managed Care – PPO

## 2018-02-04 ENCOUNTER — Encounter: Admit: 2018-02-04 | Discharge: 2018-02-04

## 2018-02-04 DIAGNOSIS — C786 Secondary malignant neoplasm of retroperitoneum and peritoneum: ICD-10-CM

## 2018-02-04 DIAGNOSIS — C181 Malignant neoplasm of appendix: Principal | ICD-10-CM

## 2018-02-04 DIAGNOSIS — C801 Malignant (primary) neoplasm, unspecified: ICD-10-CM

## 2018-02-04 DIAGNOSIS — Z006 Encounter for examination for normal comparison and control in clinical research program: ICD-10-CM

## 2018-02-04 LAB — COMPREHENSIVE METABOLIC PANEL
Lab: 1.4 mg/dL — ABNORMAL HIGH (ref 0.4–1.00)
Lab: 102 mg/dL — ABNORMAL HIGH (ref 70–100)
Lab: 134 MMOL/L — ABNORMAL LOW (ref 137–147)
Lab: 36 mg/dL — ABNORMAL HIGH (ref 7–25)
Lab: 4.1 MMOL/L — ABNORMAL LOW (ref 3.5–5.1)
Lab: 8.3 g/dL — ABNORMAL HIGH (ref 6.0–8.0)
Lab: 98 MMOL/L — ABNORMAL LOW (ref 98–110)

## 2018-02-04 LAB — CBC AND DIFF: Lab: 4.9 10*3/uL (ref 4.5–11.0)

## 2018-02-13 ENCOUNTER — Encounter: Admit: 2018-02-13 | Discharge: 2018-02-13 | Payer: BC Managed Care – PPO

## 2018-02-13 ENCOUNTER — Ambulatory Visit: Admit: 2018-02-13 | Discharge: 2018-02-13

## 2018-02-13 DIAGNOSIS — Z006 Encounter for examination for normal comparison and control in clinical research program: Principal | ICD-10-CM

## 2018-02-13 DIAGNOSIS — C181 Malignant neoplasm of appendix: ICD-10-CM

## 2018-02-18 ENCOUNTER — Encounter: Admit: 2018-02-18 | Discharge: 2018-02-18 | Payer: BC Managed Care – PPO

## 2018-02-18 ENCOUNTER — Encounter: Admit: 2018-02-18 | Discharge: 2018-02-18

## 2018-02-18 ENCOUNTER — Encounter: Admit: 2018-02-18 | Discharge: 2018-02-19 | Payer: BC Managed Care – PPO

## 2018-02-18 DIAGNOSIS — C181 Malignant neoplasm of appendix: Principal | ICD-10-CM

## 2018-02-18 DIAGNOSIS — C801 Malignant (primary) neoplasm, unspecified: ICD-10-CM

## 2018-02-18 DIAGNOSIS — Z006 Encounter for examination for normal comparison and control in clinical research program: ICD-10-CM

## 2018-02-18 DIAGNOSIS — C786 Secondary malignant neoplasm of retroperitoneum and peritoneum: Secondary | ICD-10-CM

## 2018-02-18 MED ORDER — (INV) EPACADOSTAT (INCB024360) 100MG TAB
300 mg | ORAL_TABLET | Freq: Two times a day (BID) | ORAL | 0 refills | Status: AC
Start: 2018-02-18 — End: 2018-03-18

## 2018-02-18 MED ORDER — HEPARIN, PORCINE (PF) 100 UNIT/ML IV SYRG
500 [IU] | Freq: Once | 0 refills | Status: CP
Start: 2018-02-18 — End: ?

## 2018-02-18 MED ORDER — (INV) SIROLIMUS 1MG TAB
2 mg | ORAL_TABLET | Freq: Every day | ORAL | 0 refills | Status: AC
Start: 2018-02-18 — End: 2018-03-18

## 2018-02-19 LAB — PERIPHERAL SMEAR

## 2018-02-19 LAB — CEA(CARCINOEMBRYONIC AG): Lab: 166 ng/mL — ABNORMAL HIGH (ref <3.0)

## 2018-02-19 LAB — VITAMIN B12: Lab: 590 pg/mL (ref 180–914)

## 2018-02-19 LAB — IRON + BINDING CAPACITY + %SAT+ FERRITIN
Lab: 113 ng/mL — ABNORMAL HIGH (ref 10–200)
Lab: 28 ug/dL — ABNORMAL LOW (ref 50–160)

## 2018-02-19 LAB — FOLATE, SERUM: Lab: 7.9 ng/mL (ref >3.9)

## 2018-02-25 ENCOUNTER — Encounter: Admit: 2018-02-25 | Discharge: 2018-02-25 | Payer: BC Managed Care – PPO

## 2018-02-25 DIAGNOSIS — Z006 Encounter for examination for normal comparison and control in clinical research program: Principal | ICD-10-CM

## 2018-02-25 DIAGNOSIS — C181 Malignant neoplasm of appendix: ICD-10-CM

## 2018-02-26 ENCOUNTER — Encounter: Admit: 2018-02-26 | Discharge: 2018-02-26 | Payer: BC Managed Care – PPO

## 2018-02-26 DIAGNOSIS — C801 Malignant (primary) neoplasm, unspecified: ICD-10-CM

## 2018-02-26 DIAGNOSIS — C181 Malignant neoplasm of appendix: Principal | ICD-10-CM

## 2018-02-27 ENCOUNTER — Encounter: Admit: 2018-02-27 | Discharge: 2018-02-27 | Payer: BC Managed Care – PPO

## 2018-02-27 ENCOUNTER — Encounter: Admit: 2018-02-27 | Discharge: 2018-02-27

## 2018-02-27 DIAGNOSIS — C181 Malignant neoplasm of appendix: Principal | ICD-10-CM

## 2018-02-27 DIAGNOSIS — C786 Secondary malignant neoplasm of retroperitoneum and peritoneum: Principal | ICD-10-CM

## 2018-02-27 DIAGNOSIS — C801 Malignant (primary) neoplasm, unspecified: ICD-10-CM

## 2018-03-04 ENCOUNTER — Encounter: Admit: 2018-03-04 | Discharge: 2018-03-04 | Payer: BC Managed Care – PPO

## 2018-03-04 DIAGNOSIS — C181 Malignant neoplasm of appendix: Principal | ICD-10-CM

## 2018-03-04 DIAGNOSIS — C801 Malignant (primary) neoplasm, unspecified: ICD-10-CM

## 2018-03-18 ENCOUNTER — Encounter: Admit: 2018-03-18 | Discharge: 2018-03-18 | Payer: BC Managed Care – PPO

## 2018-03-18 ENCOUNTER — Encounter: Admit: 2018-03-18 | Discharge: 2018-03-18

## 2018-03-18 ENCOUNTER — Encounter: Admit: 2018-03-18 | Discharge: 2018-03-19 | Payer: BC Managed Care – PPO

## 2018-03-18 DIAGNOSIS — C786 Secondary malignant neoplasm of retroperitoneum and peritoneum: ICD-10-CM

## 2018-03-18 DIAGNOSIS — C181 Malignant neoplasm of appendix: Principal | ICD-10-CM

## 2018-03-18 DIAGNOSIS — D509 Iron deficiency anemia, unspecified: ICD-10-CM

## 2018-03-18 DIAGNOSIS — C801 Malignant (primary) neoplasm, unspecified: ICD-10-CM

## 2018-03-18 DIAGNOSIS — Z006 Encounter for examination for normal comparison and control in clinical research program: ICD-10-CM

## 2018-03-18 DIAGNOSIS — N179 Acute kidney failure, unspecified: ICD-10-CM

## 2018-03-18 DIAGNOSIS — G629 Polyneuropathy, unspecified: ICD-10-CM

## 2018-03-18 LAB — CBC AND DIFF
Lab: 0 10*3/uL (ref 0–0.20)
Lab: 0.1 10*3/uL (ref 0–0.45)
Lab: 5.1 10*3/uL (ref 4.5–11.0)

## 2018-03-18 LAB — COMPREHENSIVE METABOLIC PANEL
Lab: 133 MMOL/L — ABNORMAL LOW (ref 137–147)
Lab: 14 10*3/uL — ABNORMAL HIGH (ref 3–12)
Lab: 14 U/L (ref 7–56)
Lab: 145 U/L — ABNORMAL HIGH (ref 25–110)
Lab: 18 U/L (ref 7–40)
Lab: 22 MMOL/L (ref 21–30)
Lab: 29 mL/min — ABNORMAL LOW (ref >60)
Lab: 3.8 g/dL (ref 3.5–5.0)
Lab: 35 mL/min — ABNORMAL LOW (ref >60)

## 2018-03-18 LAB — LDH-LACTATE DEHYDROGENASE: Lab: 130 U/L — ABNORMAL LOW (ref 100–210)

## 2018-03-18 LAB — PHOSPHORUS: Lab: 3.3 mg/dL — ABNORMAL LOW (ref 2.0–4.5)

## 2018-03-18 MED ORDER — (INV) EPACADOSTAT (INCB024360) 100MG TAB
300 mg | ORAL_TABLET | Freq: Two times a day (BID) | ORAL | 0 refills | Status: AC
Start: 2018-03-18 — End: 2018-04-15

## 2018-03-18 MED ORDER — (INV) SIROLIMUS 1MG TAB
2 mg | ORAL_TABLET | Freq: Every day | ORAL | 0 refills | Status: AC
Start: 2018-03-18 — End: 2018-04-15

## 2018-03-19 LAB — LIPID PROFILE
Lab: 172 mg/dL — ABNORMAL LOW (ref 0.3–1.2)
Lab: 218 mg/dL — ABNORMAL HIGH (ref <200)
Lab: 65 mg/dL — ABNORMAL HIGH (ref 6.0–8.0)

## 2018-03-19 LAB — AMYLASE: Lab: 120 U/L — ABNORMAL HIGH (ref >40)

## 2018-03-19 LAB — CEA(CARCINOEMBRYONIC AG): Lab: 173 ng/mL — ABNORMAL HIGH (ref <3.0)

## 2018-03-19 LAB — THYROID STIMULATING HORMONE-TSH: Lab: 4 uU/mL — ABNORMAL HIGH (ref <100)

## 2018-03-19 LAB — LIPASE: Lab: 108 U/L — ABNORMAL HIGH (ref <150)

## 2018-03-25 ENCOUNTER — Encounter: Admit: 2018-03-25 | Discharge: 2018-03-25

## 2018-03-25 DIAGNOSIS — N179 Acute kidney failure, unspecified: Secondary | ICD-10-CM

## 2018-03-25 DIAGNOSIS — C786 Secondary malignant neoplasm of retroperitoneum and peritoneum: Secondary | ICD-10-CM

## 2018-03-25 DIAGNOSIS — C181 Malignant neoplasm of appendix: Secondary | ICD-10-CM

## 2018-03-25 DIAGNOSIS — C801 Malignant (primary) neoplasm, unspecified: Secondary | ICD-10-CM

## 2018-03-25 DIAGNOSIS — Z006 Encounter for examination for normal comparison and control in clinical research program: ICD-10-CM

## 2018-03-25 LAB — CBC
Lab: 15 % — ABNORMAL HIGH (ref 11–15)
Lab: 25 % — ABNORMAL LOW (ref 36–45)
Lab: 27 pg (ref 26–34)
Lab: 3 M/UL — ABNORMAL LOW (ref 4.0–5.0)
Lab: 33 g/dL (ref 32.0–36.0)
Lab: 460 10*3/uL — ABNORMAL HIGH (ref 150–400)
Lab: 6.1 10*3/uL (ref 4.5–11.0)
Lab: 6.2 FL — ABNORMAL LOW (ref 7–11)
Lab: 8.4 g/dL — ABNORMAL LOW (ref 12.0–15.0)
Lab: 81 FL (ref 80–100)

## 2018-03-25 LAB — URINALYSIS DIPSTICK
Lab: 1 (ref 1.003–1.035)
Lab: 5.5 (ref 5.0–8.0)
Lab: NEGATIVE
Lab: NEGATIVE
Lab: NEGATIVE
Lab: POSITIVE — AB
Lab: NEGATIVE
Lab: NEGATIVE

## 2018-03-25 LAB — BASIC METABOLIC PANEL
Lab: 1.8 mg/dL — ABNORMAL HIGH (ref 0.4–1.00)
Lab: 131 MMOL/L — ABNORMAL LOW (ref 137–147)
Lab: 21 MMOL/L (ref 21–30)
Lab: 28 mL/min — ABNORMAL LOW (ref >60)
Lab: 34 mL/min — ABNORMAL LOW (ref >60)
Lab: 4.2 MMOL/L (ref 3.5–5.1)
Lab: 45 mg/dL — ABNORMAL HIGH (ref 7–25)
Lab: 88 mg/dL (ref 70–100)
Lab: 9.8 mg/dL (ref 8.5–10.6)
Lab: 96 MMOL/L — ABNORMAL LOW (ref 98–110)
Lab: 14 — ABNORMAL HIGH (ref 3–12)

## 2018-03-25 LAB — URINALYSIS, MICROSCOPIC

## 2018-03-27 ENCOUNTER — Encounter: Admit: 2018-03-27 | Discharge: 2018-03-27

## 2018-04-09 ENCOUNTER — Encounter: Admit: 2018-04-09 | Discharge: 2018-04-09

## 2018-04-09 ENCOUNTER — Ambulatory Visit: Admit: 2018-04-09 | Discharge: 2018-04-09

## 2018-04-09 ENCOUNTER — Ambulatory Visit: Admit: 2018-04-05 | Discharge: 2018-04-05 | Payer: BC Managed Care

## 2018-04-09 ENCOUNTER — Encounter: Admit: 2018-04-09 | Discharge: 2018-04-09 | Payer: BC Managed Care

## 2018-04-09 ENCOUNTER — Encounter: Admit: 2018-04-09 | Discharge: 2018-04-09 | Payer: BC Managed Care – PPO

## 2018-04-09 DIAGNOSIS — Z932 Ileostomy status: Secondary | ICD-10-CM

## 2018-04-09 DIAGNOSIS — R7989 Other specified abnormal findings of blood chemistry: ICD-10-CM

## 2018-04-09 DIAGNOSIS — C799 Secondary malignant neoplasm of unspecified site: Secondary | ICD-10-CM

## 2018-04-09 DIAGNOSIS — N135 Crossing vessel and stricture of ureter without hydronephrosis: Secondary | ICD-10-CM

## 2018-04-09 DIAGNOSIS — Z9049 Acquired absence of other specified parts of digestive tract: Secondary | ICD-10-CM

## 2018-04-09 DIAGNOSIS — Z79899 Other long term (current) drug therapy: ICD-10-CM

## 2018-04-09 DIAGNOSIS — N131 Hydronephrosis with ureteral stricture, not elsewhere classified: Principal | ICD-10-CM

## 2018-04-09 DIAGNOSIS — C801 Malignant (primary) neoplasm, unspecified: Secondary | ICD-10-CM

## 2018-04-09 DIAGNOSIS — C181 Malignant neoplasm of appendix: Secondary | ICD-10-CM

## 2018-04-09 DIAGNOSIS — I454 Nonspecific intraventricular block: Secondary | ICD-10-CM

## 2018-04-09 MED ORDER — FENTANYL CITRATE (PF) 50 MCG/ML IJ SOLN
0 refills | Status: DC
Start: 2018-04-09 — End: 2018-04-09
  Administered 2018-04-09: 17:00:00 25 ug via INTRAVENOUS

## 2018-04-09 MED ORDER — LIDOCAINE (PF) 200 MG/10 ML (2 %) IJ SYRG
0 refills | Status: DC
Start: 2018-04-09 — End: 2018-04-09
  Administered 2018-04-09: 17:00:00 50 mg via INTRAVENOUS

## 2018-04-09 MED ORDER — CEFAZOLIN INJ 1GM IVP
2 g | Freq: Once | INTRAVENOUS | 0 refills | Status: CN
Start: 2018-04-09 — End: ?

## 2018-04-09 MED ORDER — DIPHENHYDRAMINE HCL 50 MG/ML IJ SOLN
25 mg | Freq: Once | INTRAVENOUS | 0 refills | Status: DC | PRN
Start: 2018-04-09 — End: 2018-04-09

## 2018-04-09 MED ORDER — TAMSULOSIN 0.4 MG PO CAP
.4 mg | ORAL_CAPSULE | Freq: Every evening | ORAL | 3 refills | 90.00000 days | Status: AC
Start: 2018-04-09 — End: 2018-11-24
  Filled 2018-04-09 (×2): qty 90, 30d supply, fill #1

## 2018-04-09 MED ORDER — MIDAZOLAM 1 MG/ML IJ SOLN
INTRAVENOUS | 0 refills | Status: DC
Start: 2018-04-09 — End: 2018-04-09
  Administered 2018-04-09: 17:00:00 2 mg via INTRAVENOUS

## 2018-04-09 MED ORDER — DEXAMETHASONE SODIUM PHOSPHATE 4 MG/ML IJ SOLN
INTRAVENOUS | 0 refills | Status: DC
Start: 2018-04-09 — End: 2018-04-09
  Administered 2018-04-09: 17:00:00 4 mg via INTRAVENOUS

## 2018-04-09 MED ORDER — ONDANSETRON HCL (PF) 4 MG/2 ML IJ SOLN
INTRAVENOUS | 0 refills | Status: DC
Start: 2018-04-09 — End: 2018-04-09
  Administered 2018-04-09: 18:00:00 4 mg via INTRAVENOUS

## 2018-04-09 MED ORDER — PROPOFOL INJ 10 MG/ML IV VIAL
0 refills | Status: DC
Start: 2018-04-09 — End: 2018-04-09
  Administered 2018-04-09: 17:00:00 150 mg via INTRAVENOUS

## 2018-04-09 MED ORDER — DEXTRAN 70-HYPROMELLOSE (PF) 0.1-0.3 % OP DPET
0 refills | Status: DC
Start: 2018-04-09 — End: 2018-04-09

## 2018-04-09 MED ORDER — LIDOCAINE HCL 2 % MM JELP
0 refills | Status: DC
Start: 2018-04-09 — End: 2018-04-09
  Administered 2018-04-09: 18:00:00 20 mL via TOPICAL

## 2018-04-09 MED ORDER — SODIUM CHLORIDE 0.9 % IV SOLP
1000 mL | INTRAVENOUS | 0 refills | Status: DC
Start: 2018-04-09 — End: 2018-04-09
  Administered 2018-04-09: 17:00:00 1000.000 mL via INTRAVENOUS

## 2018-04-09 MED ORDER — HYOSCYAMINE SULFATE 0.125 MG SL SUBL
125 ug | ORAL_TABLET | SUBLINGUAL | 6 refills | Status: AC | PRN
Start: 2018-04-09 — End: 2018-11-24
  Filled 2018-04-09 (×2): qty 30, 5d supply, fill #1

## 2018-04-09 MED ORDER — PHENYLEPHRINE IN 0.9% NACL(PF) 1 MG/10 ML (100 MCG/ML) IV SYRG
INTRAVENOUS | 0 refills | Status: DC
Start: 2018-04-09 — End: 2018-04-09
  Administered 2018-04-09: 17:00:00 100 ug via INTRAVENOUS

## 2018-04-09 MED ORDER — POLYETHYLENE GLYCOL 3350 17 GRAM PO PWPK
17 g | PACK | Freq: Every day | ORAL | 1 refills | Status: SS | PRN
Start: 2018-04-09 — End: 2018-07-18

## 2018-04-09 MED ORDER — LIDOCAINE (PF) 10 MG/ML (1 %) IJ SOLN
.1-2 mL | INTRAMUSCULAR | 0 refills | Status: DC | PRN
Start: 2018-04-09 — End: 2018-04-09

## 2018-04-09 MED ORDER — OXYCODONE 5 MG PO TAB
5 mg | ORAL_TABLET | ORAL | 0 refills | 6.00000 days | Status: AC | PRN
Start: 2018-04-09 — End: 2018-11-24

## 2018-04-09 MED ORDER — OXYBUTYNIN CHLORIDE 5 MG PO TAB
5 mg | ORAL_TABLET | Freq: Three times a day (TID) | ORAL | 6 refills | 12.00000 days | Status: AC | PRN
Start: 2018-04-09 — End: 2018-06-17
  Filled 2018-04-09 (×2): qty 30, 10d supply, fill #1

## 2018-04-09 MED ORDER — CEFAZOLIN 1 GRAM IJ SOLR
0 refills | Status: DC
Start: 2018-04-09 — End: 2018-04-09
  Administered 2018-04-09: 17:00:00 2 g via INTRAVENOUS

## 2018-04-09 MED ORDER — FENTANYL CITRATE (PF) 50 MCG/ML IJ SOLN
50 ug | INTRAVENOUS | 0 refills | Status: DC | PRN
Start: 2018-04-09 — End: 2018-04-09

## 2018-04-09 MED ORDER — HYOSCYAMINE SULFATE 0.125 MG PO TBDI
.125 mg | Freq: Once | SUBLINGUAL | 0 refills | Status: CP
Start: 2018-04-09 — End: ?
  Administered 2018-04-09: 18:00:00 0.125 mg via SUBLINGUAL

## 2018-04-09 MED ADMIN — SODIUM CHLORIDE 0.9 % IV SOLP [27838]: 1000 mL | INTRAVENOUS | @ 17:00:00 | Stop: 2018-04-09 | NDC 00338004904

## 2018-04-10 ENCOUNTER — Encounter: Admit: 2018-04-10 | Discharge: 2018-04-10

## 2018-04-11 ENCOUNTER — Encounter: Admit: 2018-04-11 | Discharge: 2018-04-11

## 2018-04-11 DIAGNOSIS — I454 Nonspecific intraventricular block: Secondary | ICD-10-CM

## 2018-04-11 DIAGNOSIS — C181 Malignant neoplasm of appendix: Secondary | ICD-10-CM

## 2018-04-11 DIAGNOSIS — C801 Malignant (primary) neoplasm, unspecified: Secondary | ICD-10-CM

## 2018-04-12 ENCOUNTER — Encounter: Admit: 2018-04-12 | Discharge: 2018-04-12

## 2018-04-12 DIAGNOSIS — C181 Malignant neoplasm of appendix: Secondary | ICD-10-CM

## 2018-04-12 DIAGNOSIS — Z006 Encounter for examination for normal comparison and control in clinical research program: Secondary | ICD-10-CM

## 2018-04-12 LAB — POC CREATININE, RAD: Lab: 1.5 mg/dL — ABNORMAL HIGH (ref 0.4–1.00)

## 2018-04-12 MED ORDER — SODIUM CHLORIDE 0.9 % IJ SOLN
50 mL | Freq: Once | INTRAVENOUS | 0 refills | Status: CP
Start: 2018-04-12 — End: ?
  Administered 2018-04-12: 15:00:00 50 mL via INTRAVENOUS

## 2018-04-12 MED ORDER — HEPARIN, PORCINE (PF) 100 UNIT/ML IV SYRG
500 [IU] | Freq: Once | 0 refills | Status: CP
Start: 2018-04-12 — End: ?

## 2018-04-12 MED ORDER — IOHEXOL 350 MG IODINE/ML IV SOLN
100 mL | Freq: Once | INTRAVENOUS | 0 refills | Status: CP
Start: 2018-04-12 — End: ?
  Administered 2018-04-12: 15:00:00 60 mL via INTRAVENOUS

## 2018-04-15 ENCOUNTER — Encounter: Admit: 2018-04-15 | Discharge: 2018-04-15

## 2018-04-15 ENCOUNTER — Encounter: Admit: 2018-04-15 | Discharge: 2018-04-16

## 2018-04-15 DIAGNOSIS — C786 Secondary malignant neoplasm of retroperitoneum and peritoneum: Secondary | ICD-10-CM

## 2018-04-15 DIAGNOSIS — C801 Malignant (primary) neoplasm, unspecified: Secondary | ICD-10-CM

## 2018-04-15 DIAGNOSIS — Z006 Encounter for examination for normal comparison and control in clinical research program: Secondary | ICD-10-CM

## 2018-04-15 DIAGNOSIS — C181 Malignant neoplasm of appendix: Secondary | ICD-10-CM

## 2018-04-15 LAB — COMPREHENSIVE METABOLIC PANEL
Lab: 10 U/L (ref 7–56)
Lab: 129 U/L — ABNORMAL HIGH (ref 25–110)
Lab: 132 MMOL/L — ABNORMAL LOW (ref 137–147)
Lab: 14 10*3/uL — ABNORMAL HIGH (ref 3–12)
Lab: 17 U/L (ref 7–40)
Lab: 22 MMOL/L (ref 21–30)
Lab: 3.8 g/dL (ref 3.5–5.0)
Lab: 36 mL/min — ABNORMAL LOW (ref >60)
Lab: 43 mL/min — ABNORMAL LOW (ref >60)

## 2018-04-15 LAB — CBC AND DIFF
Lab: 0.1 10*3/uL (ref 0–0.20)
Lab: 0.1 10*3/uL (ref 0–0.45)
Lab: 5.6 10*3/uL (ref 4.5–11.0)

## 2018-04-15 LAB — LIPASE: Lab: 43 U/L — ABNORMAL HIGH (ref <150)

## 2018-04-15 LAB — LIPID PROFILE
Lab: 169 mg/dL — ABNORMAL LOW (ref 0.3–1.2)
Lab: 215 mg/dL — ABNORMAL HIGH (ref <200)
Lab: 72 mg/dL — ABNORMAL HIGH (ref 6.0–8.0)

## 2018-04-15 LAB — THYROID STIMULATING HORMONE-TSH: Lab: 5.7 uU/mL — ABNORMAL HIGH (ref <100)

## 2018-04-15 LAB — LDH-LACTATE DEHYDROGENASE: Lab: 125 U/L — ABNORMAL LOW (ref 100–210)

## 2018-04-15 LAB — PHOSPHORUS: Lab: 3 mg/dL — ABNORMAL LOW (ref 2.0–4.5)

## 2018-04-15 LAB — CEA(CARCINOEMBRYONIC AG): Lab: 237 ng/mL — ABNORMAL HIGH (ref <3.0)

## 2018-04-15 LAB — AMYLASE: Lab: 81 U/L — ABNORMAL HIGH (ref >40)

## 2018-04-15 MED ORDER — (INV) EPACADOSTAT (INCB024360) 100MG TAB
300 mg | ORAL_TABLET | Freq: Two times a day (BID) | ORAL | 0 refills | Status: AC
Start: 2018-04-15 — End: 2018-05-13

## 2018-04-15 MED ORDER — (INV) SIROLIMUS 1MG TAB
2 mg | ORAL_TABLET | Freq: Every day | ORAL | 0 refills | Status: AC
Start: 2018-04-15 — End: 2018-05-13

## 2018-04-17 ENCOUNTER — Encounter: Admit: 2018-04-17 | Discharge: 2018-04-17

## 2018-04-19 ENCOUNTER — Encounter: Admit: 2018-04-19 | Discharge: 2018-04-19 | Payer: BC Managed Care – PPO

## 2018-04-19 ENCOUNTER — Encounter: Admit: 2018-04-19 | Discharge: 2018-04-19

## 2018-04-19 ENCOUNTER — Encounter: Admit: 2018-04-19 | Discharge: 2018-04-19 | Payer: BC Managed Care

## 2018-04-19 DIAGNOSIS — C181 Malignant neoplasm of appendix: Secondary | ICD-10-CM

## 2018-04-19 DIAGNOSIS — Z006 Encounter for examination for normal comparison and control in clinical research program: Secondary | ICD-10-CM

## 2018-04-25 ENCOUNTER — Encounter: Admit: 2018-04-25 | Discharge: 2018-04-25 | Payer: BC Managed Care – PPO

## 2018-04-26 ENCOUNTER — Encounter: Admit: 2018-04-26 | Discharge: 2018-04-26 | Payer: BC Managed Care – PPO

## 2018-04-30 ENCOUNTER — Encounter: Admit: 2018-04-30 | Discharge: 2018-04-30 | Payer: BC Managed Care – PPO

## 2018-04-30 DIAGNOSIS — C801 Malignant (primary) neoplasm, unspecified: ICD-10-CM

## 2018-04-30 DIAGNOSIS — I454 Nonspecific intraventricular block: ICD-10-CM

## 2018-04-30 DIAGNOSIS — C181 Malignant neoplasm of appendix: Principal | ICD-10-CM

## 2018-05-02 ENCOUNTER — Encounter: Admit: 2018-05-02 | Discharge: 2018-05-02 | Payer: BC Managed Care – PPO

## 2018-05-07 ENCOUNTER — Encounter: Admit: 2018-05-07 | Discharge: 2018-05-07 | Payer: BC Managed Care – PPO

## 2018-05-07 DIAGNOSIS — N135 Crossing vessel and stricture of ureter without hydronephrosis: Secondary | ICD-10-CM

## 2018-05-12 ENCOUNTER — Encounter: Admit: 2018-05-12 | Discharge: 2018-05-12 | Payer: BC Managed Care – PPO

## 2018-05-13 ENCOUNTER — Encounter: Admit: 2018-05-13 | Discharge: 2018-05-13 | Payer: BC Managed Care – PPO

## 2018-05-13 ENCOUNTER — Encounter: Admit: 2018-05-13 | Discharge: 2018-05-13

## 2018-05-13 DIAGNOSIS — C181 Malignant neoplasm of appendix: Principal | ICD-10-CM

## 2018-05-13 DIAGNOSIS — C786 Secondary malignant neoplasm of retroperitoneum and peritoneum: ICD-10-CM

## 2018-05-13 DIAGNOSIS — R7989 Other specified abnormal findings of blood chemistry: ICD-10-CM

## 2018-05-13 DIAGNOSIS — R140 Abdominal distension (gaseous): ICD-10-CM

## 2018-05-13 DIAGNOSIS — G629 Polyneuropathy, unspecified: ICD-10-CM

## 2018-05-13 DIAGNOSIS — C801 Malignant (primary) neoplasm, unspecified: ICD-10-CM

## 2018-05-13 DIAGNOSIS — Z006 Encounter for examination for normal comparison and control in clinical research program: ICD-10-CM

## 2018-05-13 DIAGNOSIS — R5383 Other fatigue: ICD-10-CM

## 2018-05-13 DIAGNOSIS — R530 Neoplastic (malignant) related fatigue: ICD-10-CM

## 2018-05-13 DIAGNOSIS — I454 Nonspecific intraventricular block: ICD-10-CM

## 2018-05-13 LAB — COMPREHENSIVE METABOLIC PANEL
Lab: 12 10*3/uL (ref 3–12)
Lab: 130 MMOL/L — ABNORMAL LOW (ref 137–147)
Lab: 15 U/L (ref 7–56)
Lab: 18 U/L (ref 7–40)
Lab: 203 U/L — ABNORMAL HIGH (ref 25–110)
Lab: 24 MMOL/L (ref 21–30)
Lab: 27 mL/min — ABNORMAL LOW (ref >60)
Lab: 3.9 MMOL/L — ABNORMAL LOW (ref 3.5–5.1)

## 2018-05-13 LAB — CBC AND DIFF
Lab: 0.1 10*3/uL (ref 0–0.20)
Lab: 0.1 10*3/uL (ref 0–0.45)
Lab: 0.2 K/UL — ABNORMAL LOW (ref >60)
Lab: 5.2 10*3/uL — ABNORMAL LOW (ref 4.5–11.0)
Lab: 82 % — ABNORMAL HIGH (ref 41–77)

## 2018-05-13 LAB — PHOSPHORUS: Lab: 3.2 mg/dL — ABNORMAL LOW (ref 2.0–4.5)

## 2018-05-13 LAB — LIPID PROFILE
Lab: 182 mg/dL — ABNORMAL LOW (ref 0.3–1.2)
Lab: 230 mg/dL — ABNORMAL HIGH (ref <200)
Lab: 247 mg/dL — ABNORMAL HIGH (ref <150)
Lab: 49 mg/dL — ABNORMAL HIGH (ref 6.0–8.0)

## 2018-05-13 LAB — CEA(CARCINOEMBRYONIC AG): Lab: 233 ng/mL — ABNORMAL HIGH (ref <3.0)

## 2018-05-13 LAB — THYROID STIMULATING HORMONE-TSH: Lab: 7.6 uU/mL — ABNORMAL HIGH (ref <100)

## 2018-05-13 LAB — AMYLASE: Lab: 90 U/L — ABNORMAL LOW (ref >40)

## 2018-05-13 MED ORDER — (INV) SIROLIMUS 1MG TAB
2 mg | ORAL_TABLET | Freq: Every day | ORAL | 0 refills | Status: AC
Start: 2018-05-13 — End: ?

## 2018-05-13 MED ORDER — (INV) EPACADOSTAT (INCB024360) 100MG TAB
300 mg | ORAL_TABLET | Freq: Two times a day (BID) | ORAL | 0 refills | Status: AC
Start: 2018-05-13 — End: ?

## 2018-05-13 NOTE — Progress Notes
Premedications/Prehydration given as ordered. N/A    Arm band verified at bedside with second RN (same RN as above unless otherwise noted). Yes    Labs/applicable tests checked: Yes    Chemo drug/dose/route:  (INV) epacadostat ([id number]) 100 mg tablet [[id number]]     Order Details   Dose: 300 mg Route: Oral Frequency: TWICE DAILY   Dispense Quantity: 175 tablet Refills: 0 Fills remaining: --           Sig: Take three tablets by mouth twice daily for 28 days. Take approximately 12 hours apart. ** INVESTIGATIONAL **               (INV) sirolimus 1 mg tablet [[id number]]     Order Details   Dose: 2 mg Route: Oral Frequency: DAILY   Dispense Quantity: 62 tablet Refills: 0 Fills remaining: --           Sig: Take two tablets by mouth daily for 28 days.          Written Date: 05/13/18 Expiration Date: 05/12/19     Start Date: 05/13/18 End Date: 06/10/18 after 28 doses     Ordering Provider:  Manual Meier, APRN Authorizing Provider: Manual Meier, APRN Ordering User:  Manual Meier, APRN          NF Ordered?  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

## 2018-05-21 ENCOUNTER — Encounter: Admit: 2018-05-21 | Discharge: 2018-05-21 | Payer: BC Managed Care – PPO

## 2018-05-21 DIAGNOSIS — C786 Secondary malignant neoplasm of retroperitoneum and peritoneum: Principal | ICD-10-CM

## 2018-05-22 ENCOUNTER — Encounter: Admit: 2018-05-22 | Discharge: 2018-05-22 | Payer: BC Managed Care – PPO

## 2018-05-23 ENCOUNTER — Encounter: Admit: 2018-05-23 | Discharge: 2018-05-23 | Payer: BC Managed Care – PPO

## 2018-05-29 ENCOUNTER — Encounter: Admit: 2018-05-29 | Discharge: 2018-05-29 | Payer: BC Managed Care – PPO

## 2018-06-05 ENCOUNTER — Encounter: Admit: 2018-06-05 | Discharge: 2018-06-05 | Payer: BC Managed Care – PPO

## 2018-06-06 ENCOUNTER — Encounter: Admit: 2018-06-06 | Discharge: 2018-06-06 | Payer: BC Managed Care – PPO

## 2018-06-07 ENCOUNTER — Encounter: Admit: 2018-06-07 | Discharge: 2018-06-07 | Payer: BC Managed Care – PPO

## 2018-06-07 DIAGNOSIS — Z006 Encounter for examination for normal comparison and control in clinical research program: Principal | ICD-10-CM

## 2018-06-07 DIAGNOSIS — C181 Malignant neoplasm of appendix: ICD-10-CM

## 2018-06-10 ENCOUNTER — Encounter: Admit: 2018-06-10 | Discharge: 2018-06-10 | Payer: BC Managed Care – PPO

## 2018-06-10 ENCOUNTER — Encounter: Admit: 2018-06-10 | Discharge: 2018-06-10

## 2018-06-10 DIAGNOSIS — C181 Malignant neoplasm of appendix: Principal | ICD-10-CM

## 2018-06-10 DIAGNOSIS — C786 Secondary malignant neoplasm of retroperitoneum and peritoneum: ICD-10-CM

## 2018-06-10 DIAGNOSIS — R188 Other ascites: ICD-10-CM

## 2018-06-10 DIAGNOSIS — G629 Polyneuropathy, unspecified: ICD-10-CM

## 2018-06-10 DIAGNOSIS — C801 Malignant (primary) neoplasm, unspecified: ICD-10-CM

## 2018-06-10 DIAGNOSIS — D649 Anemia, unspecified: ICD-10-CM

## 2018-06-10 DIAGNOSIS — R5383 Other fatigue: ICD-10-CM

## 2018-06-10 DIAGNOSIS — R7989 Other specified abnormal findings of blood chemistry: ICD-10-CM

## 2018-06-10 DIAGNOSIS — I454 Nonspecific intraventricular block: ICD-10-CM

## 2018-06-10 DIAGNOSIS — Z006 Encounter for examination for normal comparison and control in clinical research program: ICD-10-CM

## 2018-06-10 LAB — PTT (APTT): Lab: 28 s (ref 24.0–36.5)

## 2018-06-10 LAB — CBC AND DIFF
Lab: 0 10*3/uL (ref 0–0.45)
Lab: 0.1 10*3/uL (ref 0–0.20)
Lab: 0.3 10*3/uL (ref 0–0.80)
Lab: 1 % — ABNORMAL LOW (ref >60)
Lab: 19 % — ABNORMAL HIGH (ref 11–15)
Lab: 3.8 M/UL — ABNORMAL LOW (ref 4.0–5.0)
Lab: 31 % — ABNORMAL LOW (ref 36–45)
Lab: 32 g/dL (ref 32.0–36.0)
Lab: 4.4 10*3/uL (ref 1.8–7.0)
Lab: 5.1 10*3/uL (ref 4.5–11.0)
Lab: 6 % — ABNORMAL LOW (ref 24–44)
Lab: 82 FL (ref 80–100)

## 2018-06-10 LAB — LIPID PROFILE
Lab: 172 mg/dL (ref <200)
Lab: 186 mg/dL — ABNORMAL HIGH (ref <150)

## 2018-06-10 LAB — PROTIME INR (PT): Lab: 1.2 (ref 0.8–1.2)

## 2018-06-10 LAB — LIPASE: Lab: 46 U/L (ref 11–82)

## 2018-06-10 LAB — COMPREHENSIVE METABOLIC PANEL: Lab: 4.6 MMOL/L (ref <100)

## 2018-06-10 LAB — PHOSPHORUS: Lab: 3.5 mg/dL (ref 2.0–4.5)

## 2018-06-10 LAB — LDH-LACTATE DEHYDROGENASE: Lab: 111 U/L (ref 100–210)

## 2018-06-10 LAB — THYROID STIMULATING HORMONE-TSH: Lab: 6 uU/mL — ABNORMAL HIGH (ref 0.35–5.00)

## 2018-06-10 LAB — AMYLASE: Lab: 82 U/L (ref 24–100)

## 2018-06-10 LAB — CEA(CARCINOEMBRYONIC AG): Lab: 257 ng/mL — ABNORMAL HIGH (ref <3.0)

## 2018-06-10 NOTE — Progress Notes
Study Title:???Phase I study of epicadostat ([id number]) in combination with sirolimus in advanced malignancy.  HSC:???141107  Dose Escalation, DL2  SID: 01-010  Baseline Weight: 62.5 kg  Today's Visit (Cycle/Day): EOT  ???  Participant present for End of Treatment visit. Participant denies other changes to medical history or medications.  ???  Provider completed physical exam. Labs were drawn and reviewed with provider. Review provider note for assessment.   ??????  New???Medication Accountability:  Cycle???6:  Epicadostat #1: Bottle #:???[id number], Exp: 07/2018, Lot #: [id number], QTY:???35 pills    Epicadostat #2: Bottle #:???[id number], Exp: 07/2018, Lot #: [id number], QTY:???35 pills   Epicadostat #3: Bottle #:???[id number], Exp: 07/2018, Lot #: [id number], QTY:???35 pills   Epicadostat #4: Bottle #:???500065, Exp: 07/2018, Lot #: [id number], QTY: 35 pills   Epicadostat #5: Bottle #:???[id number], Exp: 07/2018, Lot #: [id number], QTY: 35 pills  Sirolimus #1: 1 mg, QTY:???62???pills  ???  Cycle 6 pill diary and pills were returned to study coordinator. Study coordinator performed drug accountability and returned all study drug to the pharmacy.  Patient dosed from 05/13/2018 to 05/21/2018 and pt stated she did not miss any doses and was 100 percent compliant.  She did not lose any pills either.    Accountability:  Epicadostat #1: Bottle #:???[id number], Exp: 07/2018, Lot #: [id number], QTY:???35 pills. Pt returned 0 pills  Epicadostat #2: Bottle #:???[id number], Exp: 07/2018, Lot #: [id number], QTY:???35 pills. Pt returned 22 pills. 3 pills are unaccounted for.  Epicadostat #3: Bottle #:???[id number], Exp: 07/2018, Lot #: [id number], QTY:???35 pills. Pt returned un-opened bottle.  Epicadostat #4: Bottle #:???[id number], Exp: 07/2018, Lot #: [id number], QTY: 35 pills. Pt returned un-opened bottle.  Epicadostat #5: Bottle #:???[id number], Exp: 07/2018, Lot #: [id number], QTY: 35 pills. Pt returned un-opened bottle.  Sirolimus #1: 1 mg, QTY:???62???pills. Pt returned 42 pills.  2 pills are unaccounted for.

## 2018-06-10 NOTE — Progress Notes
Constitutional: Positive for activity change, appetite change and fatigue. Negative for fever and unexpected weight change.   HENT: Negative for mouth sores, sore throat and trouble swallowing.    Eyes: Negative for visual disturbance.   Respiratory: Positive for shortness of breath.    Cardiovascular: Negative for chest pain.   Gastrointestinal: Positive for abdominal distention, abdominal pain and nausea. Negative for blood in stool, constipation, diarrhea and vomiting.   Genitourinary: Negative for difficulty urinating and hematuria.   Musculoskeletal: Positive for gait problem. Negative for arthralgias and back pain.   Skin: Negative for color change, pallor, rash and wound.   Neurological: Positive for numbness. Negative for dizziness, light-headedness and headaches.   Hematological: Does not bruise/bleed easily.   Psychiatric/Behavioral: Negative for agitation. The patient is nervous/anxious.        Medical History:   Diagnosis Date   ??? Bundle branch block    ??? Cancer (HCC)    ??? Cancer of appendix [facility])      Surgical History:   Procedure Laterality Date   ??? CESAREAN SECTION  1981   ??? HX CHOLECYSTECTOMY  1995   ??? LAPAROSCOPY  02/2017    biopsy peritoneal   ??? COLONOSCOPY DIAGNOSTIC WITH SPECIMEN COLLECTION BY BRUSHING/ WASHING - FLEXIBLE N/A 03/19/2017    Performed by Everardo All, MD at [facility] ENDO   ??? COLONOSCOPY WITH BIOPSY - FLEXIBLE  03/19/2017    Performed by Everardo All, MD at [facility] ENDO   ??? EXPLORATORY LAPAROTOMY, DIVERTING LOOP ILEOSTOMY N/A 04/03/2017    Performed by Loralie Champagne, MD at [facility] OR   ??? INSERTION TUNNELED CENTRAL VENOUS CATHETER - AGE 64 YEARS AND OVER Left 04/23/2017    Performed by Loralie Champagne, MD at [facility] OR   ??? CYSTOURETHROSCOPY WITH INDWELLING URETERAL STENT INSERTION Bilateral 04/09/2018    Performed by Earl Many, MD at [facility] OR   ??? CYSTOURETHROSCOPY WITH URETERAL CATHETERIZATION WITH/ WITHOUT IRRIGATION/ INSTILLATION/ URETEROPYELOGRAPHY Bilateral 04/09/2018 Comments: appears chronically ill, has facial and neck muscle wasting   HENT:      Head: Normocephalic.      Mouth/Throat:      Mouth: Mucous membranes are moist.      Pharynx: Oropharynx is clear. No oropharyngeal exudate or posterior oropharyngeal erythema.   Eyes:      General:         Right eye: No discharge.      Conjunctiva/sclera: Conjunctivae normal.   Cardiovascular:      Rate and Rhythm: Normal rate and regular rhythm.      Heart sounds: No murmur.   Pulmonary:      Effort: Pulmonary effort is normal. No respiratory distress.      Breath sounds: No stridor. No wheezing, rhonchi or rales.   Chest:      Chest wall: No tenderness.   Abdominal:      General: There is distension.      Tenderness: There is no abdominal tenderness. There is no guarding or rebound.      Comments: tense with fluid wave   Musculoskeletal:         General: No swelling or tenderness.   Skin:     Coloration: Skin is not jaundiced.      Findings: No bruising.   Neurological:      Mental Status: She is alert and oriented to person, place, and time.   Psychiatric:         Mood and Affect: Mood normal.  Behavior: Behavior normal.         Thought Content: Thought content normal.         Judgment: Judgment normal.            CBC w/DIFF  CBC with Diff Latest Ref Rng & Units 06/10/2018 05/13/2018 04/15/2018 03/25/2018 03/18/2018   WBC 4.5 - 11.0 K/UL 5.1 5.2 5.6 6.1 5.1   RBC 4.0 - 5.0 M/UL 3.80(L) 3.43(L) 3.13(L) 3.09(L) 3.09(L)   HGB 12.0 - 15.0 GM/DL 10.2(L) 8.6(L) 8.0(L) 8.4(L) 8.6(L)   HCT 36 - 45 % 31.4(L) 26.4(L) 25.1(L) 25.3(L) 25.6(L)   MCV 80 - 100 FL 82.7 77.1(L) 80.0 81.8 82.8   MCH 26 - 34 PG 26.8 25.0(L) 25.6(L) 27.1 27.8   MCHC 32.0 - 36.0 G/DL 45.4 09.8 11.9 14.7 82.9   RDW 11 - 15 % 19.4(H) 16.9(H) 16.7(H) 15.8(H) 15.9(H)   PLT 150 - 400 K/UL 463(H) 559(H) 521(H) 460(H) 425(H)   MPV 7 - 11 FL 6.1(L) 6.3(L) 6.3(L) 6.2(L) 6.4(L)   NEUT 41 - 77 % 86(H) 82(H) 77 - 74   ANC 1.8 - 7.0 K/UL 4.40 4.30 4.30 - 3.80 Adrenal Glands and Kidneys: Normal adrenal glands. Interval placement of bilateral ureteral stents with the proximal coils near the level of the UPJs bilaterally and the distal coils in the bladder. No hydronephrosis. Minimal air within the left renal collecting system.    Pancreas and Retroperitoneum: Normal pancreas. No retroperitoneal lymphadenopathy.    Aorta and Major Vessels: Normal caliber abdominal aorta and iliac arterial system. Scattered atherosclerotic calcification. Mesenteric arterial and venous structures are patent.     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 The pelvic mass measures 20.1 x 20.1 in AP and transverse dimensions (series 2, image 125) compared to 18.9 x 19.7 cm at the same level on the prior study. Craniocaudal extent is 27.0 cm (series 605B, image 71) compared to 27.2 cm previously. There is again extensive peritoneal and omental metastatic disease with omental caking measuring up to 12.2 cm in thickness (series 2, image 77) compared to 11.9 cm previously. The target right lower quadrant peritoneal implant measures 1.7 x 1.5 cm today (series 2, image 100) compared to 1.6 x 1.5 cm previously. The stomach is mildly distended with air and fluid. Normal caliber small bowel and colon. Changes of right hemicolectomy with right lower quadrant ileostomy. There is a moderate-sized parastomal hernia with changes of carcinomatosis extending through the hernia defect. No mesenteric lymphadenopathy.    Pelvis: Small bubble of air within the decompressed bladder. The uterus is normal in size. Fluid attenuation metastatic nodules in the right perirectal soft tissues are similar to the prior study. No pelvic lymphadenopathy.    Abdominal wall and Osseous Structures: Mild thoracolumbar spondylosis. No destructive lesion in the visualized skeleton.  Impression: CHEST: caliber small bowel and colon. Changes of right hemicolectomy with right lower quadrant ileostomy. There is a moderate-sized parastomal hernia with changes of carcinomatosis extending through the hernia defect. No mesenteric lymphadenopathy.    Pelvis: Small bubble of air within the decompressed bladder. The uterus is normal in size. Fluid attenuation metastatic nodules in the right perirectal soft tissues are similar to the prior study. No pelvic lymphadenopathy.    Abdominal wall and Osseous Structures: Mild thoracolumbar spondylosis. No destructive lesion in the visualized skeleton.  Impression: CHEST:  1.  Interval increase in the size of the paraesophageal tumor implant in the lower posterior mediastinum.  2. No  thoracic lymphadenopathy. No pulmonary metastases or pleural effusion.      ABDOMEN AND PELVIS:  1. Extensive changes of pseudomyxoma peritonei are unchanged or slightly progressed in the interval.  2. Interval placement of bilateral ureteral stents. No hydronephrosis. Tiny amount of air within the bladder and left renal collecting system should be post procedural.     Finalized by Kathyrn Lass, M.D. on 04/12/2018 9:56 AM. Dictated by Kathyrn Lass, M.D. on 04/12/2018 9:20 AM.         Assessment and Plan:  This is 63 y.o. female with the following problems:    1. Metastatic appendiceal carcinoma. 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 She was on epicadostat/sirolimus trial. She started C6D1 of the epicadostat-sirolimus trial on 05/13/18. She stopped study drug on 05/21/18 as she decided to enroll in Mitomycin HIPEC trial at [facility] [place].  On 05/28/2018 patient underwent diagnostic laparoscopy, evacuation of mucinous ascites 1.5 L, peritoneal biopsy, laparoscopic HIPEC with mitomycin-C in [place] by Dr. Daryl Eastern.  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  Patient feels improvement in

## 2018-06-14 ENCOUNTER — Encounter: Admit: 2018-06-14 | Discharge: 2018-06-14 | Payer: BC Managed Care – PPO

## 2018-06-17 MED ORDER — OXYBUTYNIN CHLORIDE 5 MG PO TAB
ORAL_TABLET | Freq: Three times a day (TID) | 3 refills | 30.00000 days | Status: AC | PRN
Start: 2018-06-17 — End: 2018-11-24

## 2018-07-01 ENCOUNTER — Encounter: Admit: 2018-07-01 | Discharge: 2018-07-01 | Payer: BC Managed Care – PPO

## 2018-07-03 ENCOUNTER — Encounter: Admit: 2018-07-03 | Discharge: 2018-07-03 | Payer: BC Managed Care – PPO

## 2018-07-05 ENCOUNTER — Encounter: Admit: 2018-07-05 | Discharge: 2018-07-05 | Payer: BC Managed Care – PPO

## 2018-07-05 DIAGNOSIS — Z006 Encounter for examination for normal comparison and control in clinical research program: Principal | ICD-10-CM

## 2018-07-09 ENCOUNTER — Encounter: Admit: 2018-07-09 | Discharge: 2018-07-09 | Payer: BC Managed Care – PPO

## 2018-07-10 ENCOUNTER — Encounter: Admit: 2018-07-10 | Discharge: 2018-07-10

## 2018-07-10 ENCOUNTER — Encounter: Admit: 2018-07-10 | Discharge: 2018-07-10 | Payer: BC Managed Care – PPO

## 2018-07-10 DIAGNOSIS — I454 Nonspecific intraventricular block: ICD-10-CM

## 2018-07-10 DIAGNOSIS — G629 Polyneuropathy, unspecified: ICD-10-CM

## 2018-07-10 DIAGNOSIS — C801 Malignant (primary) neoplasm, unspecified: ICD-10-CM

## 2018-07-10 DIAGNOSIS — R188 Other ascites: ICD-10-CM

## 2018-07-10 DIAGNOSIS — C181 Malignant neoplasm of appendix: Principal | ICD-10-CM

## 2018-07-10 DIAGNOSIS — Z006 Encounter for examination for normal comparison and control in clinical research program: ICD-10-CM

## 2018-07-10 DIAGNOSIS — D649 Anemia, unspecified: ICD-10-CM

## 2018-07-10 DIAGNOSIS — R140 Abdominal distension (gaseous): ICD-10-CM

## 2018-07-10 DIAGNOSIS — C786 Secondary malignant neoplasm of retroperitoneum and peritoneum: ICD-10-CM

## 2018-07-10 DIAGNOSIS — R5383 Other fatigue: ICD-10-CM

## 2018-07-10 LAB — PROTIME INR (PT): Lab: 1.3 g/dL — ABNORMAL HIGH (ref 0.8–1.2)

## 2018-07-10 LAB — PHOSPHORUS: Lab: 2.7 mg/dL — ABNORMAL HIGH (ref 2.0–4.5)

## 2018-07-10 LAB — CBC AND DIFF
Lab: 0 10*3/uL (ref 0–0.20)
Lab: 3 M/UL — ABNORMAL LOW (ref 4.0–5.0)
Lab: 3.6 10*3/uL — ABNORMAL LOW (ref 4.5–11.0)

## 2018-07-10 LAB — LIPID PROFILE
Lab: 145 mg/dL (ref <200)
Lab: 194 mg/dL — ABNORMAL HIGH (ref <150)
Lab: 35 mg/dL — ABNORMAL LOW (ref >40)
Lab: 39 mg/dL
Lab: 74 mg/dL (ref <100)

## 2018-07-10 LAB — COMPREHENSIVE METABOLIC PANEL
Lab: 101 MMOL/L (ref 98–110)
Lab: 138 MMOL/L — ABNORMAL LOW (ref 137–147)
Lab: 4.3 MMOL/L — ABNORMAL LOW (ref 3.5–5.1)
Lab: 51 mL/min — ABNORMAL LOW (ref >60)
Lab: 9.2 mg/dL — ABNORMAL HIGH (ref 8.5–10.6)
Lab: 95 mg/dL (ref 70–100)

## 2018-07-10 NOTE — Progress Notes
Performed by Loralie Champagne, MD at [facility] OR   ??? INSERTION TUNNELED CENTRAL VENOUS CATHETER - AGE 64 YEARS AND OVER Left 04/23/2017    Performed by Loralie Champagne, MD at [facility] OR   ??? CYSTOURETHROSCOPY WITH INDWELLING URETERAL STENT INSERTION Bilateral 04/09/2018    Performed by Earl Many, MD at [facility] OR   ??? CYSTOURETHROSCOPY WITH URETERAL CATHETERIZATION WITH/ WITHOUT IRRIGATION/ INSTILLATION/ URETEROPYELOGRAPHY Bilateral 04/09/2018    Performed by Earl Many, MD at [facility] OR     Family History   Problem Relation Age of Onset   ??? Diabetes Mother    ??? Cancer Father    ??? Diabetes Brother    ??? Cancer-Lung Paternal Aunt    ??? Cancer-Breast Maternal Grandmother    ??? Diabetes Paternal Grandmother      Social History     Socioeconomic History   ??? Marital status: Married     Spouse name: Not on file   ??? Number of children: Not on file   ??? Years of education: Not on file   ??? Highest education level: Not on file   Occupational History   ??? Not on file   Tobacco Use   ??? Smoking status: Never Smoker   ??? Smokeless tobacco: Never Used   Substance and Sexual Activity   ??? Alcohol use: No     Frequency: Never     Comment: rarely   ??? Drug use: No   ??? Sexual activity: Not on file   Other Topics Concern   ??? Not on file   Social History Narrative   ??? Not on file         Objective:         ??? acetaminophen (TYLENOL) 500 mg tablet Take 1,000 mg by mouth twice daily as needed for Pain. Max of 4,000 mg of acetaminophen in 24 hours.    ??? dexamethasone (DECADRON) 4 mg tablet Take with food. Take twice per day on Days 2 and 3 after chemo   ??? diphenoxylate/atropine (LOMOTIL) 2.5/0.025 mg tablet Take one tablet by mouth four times daily as needed for Diarrhea. Indications: Diarrhea caused by Chemotherapy   ??? famotidine (PEPCID PO) Take  by mouth twice daily.   ??? hyoscyamine sulfate (LEVSIN/SL) 0.125 mg sublingual tablet Place one tablet under tongue every 4 hours as needed (Bladder Spasms).

## 2018-07-11 ENCOUNTER — Encounter: Admit: 2018-07-11 | Discharge: 2018-07-11 | Payer: BC Managed Care – PPO

## 2018-07-11 NOTE — Telephone Encounter
-----   Message from Elise Benne, RN sent at 07/11/2018 12:31 PM CDT -----  Will you call and schedule this patient to see Dr. Rainey Pines after her scans on 6/24.    Thank you

## 2018-07-15 ENCOUNTER — Encounter: Admit: 2018-07-15 | Discharge: 2018-07-15 | Payer: BC Managed Care – PPO

## 2018-07-15 DIAGNOSIS — R1900 Intra-abdominal and pelvic swelling, mass and lump, unspecified site: ICD-10-CM

## 2018-07-15 DIAGNOSIS — C181 Malignant neoplasm of appendix: Principal | ICD-10-CM

## 2018-07-16 ENCOUNTER — Encounter: Admit: 2018-07-16 | Discharge: 2018-07-16 | Payer: BC Managed Care – PPO

## 2018-07-16 DIAGNOSIS — C181 Malignant neoplasm of appendix: Principal | ICD-10-CM

## 2018-07-17 ENCOUNTER — Encounter: Admit: 2018-07-17 | Discharge: 2018-07-17 | Payer: BC Managed Care – PPO

## 2018-07-17 ENCOUNTER — Encounter: Admit: 2018-07-17 | Discharge: 2018-07-17

## 2018-07-17 DIAGNOSIS — C786 Secondary malignant neoplasm of retroperitoneum and peritoneum: ICD-10-CM

## 2018-07-17 DIAGNOSIS — I454 Nonspecific intraventricular block: ICD-10-CM

## 2018-07-17 DIAGNOSIS — C181 Malignant neoplasm of appendix: Principal | ICD-10-CM

## 2018-07-17 DIAGNOSIS — C801 Malignant (primary) neoplasm, unspecified: ICD-10-CM

## 2018-07-17 DIAGNOSIS — R1900 Intra-abdominal and pelvic swelling, mass and lump, unspecified site: ICD-10-CM

## 2018-07-17 LAB — CBC AND DIFF
Lab: 28 % — ABNORMAL LOW (ref 36–45)
Lab: 3.1 M/UL — ABNORMAL LOW (ref 4.0–5.0)

## 2018-07-17 LAB — COMPREHENSIVE METABOLIC PANEL
Lab: 10 U/L (ref 7–40)
Lab: 134 MMOL/L — ABNORMAL LOW (ref 137–147)
Lab: 27 mg/dL — ABNORMAL HIGH (ref 7–25)
Lab: 3.1 g/dL — ABNORMAL LOW (ref 3.5–5.0)
Lab: 54 mL/min — ABNORMAL LOW (ref >60)
Lab: 88 mg/dL — ABNORMAL HIGH (ref 70–100)

## 2018-07-17 LAB — CEA(CARCINOEMBRYONIC AG): Lab: 330 ng/mL — ABNORMAL HIGH (ref <3.0)

## 2018-07-17 MED ORDER — HEPARIN, PORCINE (PF) 100 UNIT/ML IV SYRG
500 [IU] | Freq: Once | 0 refills | Status: CP
Start: 2018-07-17 — End: ?

## 2018-07-17 MED ORDER — IOHEXOL 350 MG IODINE/ML IV SOLN
70 mL | Freq: Once | INTRAVENOUS | 0 refills | Status: CP
Start: 2018-07-17 — End: ?
  Administered 2018-07-17: 20:00:00 70 mL via INTRAVENOUS

## 2018-07-17 MED ORDER — SODIUM CHLORIDE 0.9 % IJ SOLN
50 mL | Freq: Once | INTRAVENOUS | 0 refills | Status: CP
Start: 2018-07-17 — End: ?
  Administered 2018-07-17: 20:00:00 50 mL via INTRAVENOUS

## 2018-07-18 ENCOUNTER — Ambulatory Visit: Admit: 2018-07-18 | Discharge: 2018-07-18

## 2018-07-18 ENCOUNTER — Encounter: Admit: 2018-07-18 | Discharge: 2018-07-18 | Payer: BC Managed Care – PPO

## 2018-07-18 DIAGNOSIS — C786 Secondary malignant neoplasm of retroperitoneum and peritoneum: ICD-10-CM

## 2018-07-18 DIAGNOSIS — R188 Other ascites: ICD-10-CM

## 2018-07-18 DIAGNOSIS — C181 Malignant neoplasm of appendix: Principal | ICD-10-CM

## 2018-07-18 DIAGNOSIS — K56609 Unspecified intestinal obstruction, unspecified as to partial versus complete obstruction: ICD-10-CM

## 2018-07-18 DIAGNOSIS — Z79899 Other long term (current) drug therapy: ICD-10-CM

## 2018-07-18 DIAGNOSIS — I454 Nonspecific intraventricular block: ICD-10-CM

## 2018-07-18 DIAGNOSIS — C801 Malignant (primary) neoplasm, unspecified: ICD-10-CM

## 2018-07-18 DIAGNOSIS — D509 Iron deficiency anemia, unspecified: ICD-10-CM

## 2018-07-18 NOTE — Patient Instructions
INTERVENTIONAL RADIOLOGY DISCHARGE INSTRUCTIONS  PARACENTESIS  ???A paracentesis is the removal of an abnormal buildup of fluid in your abdominal cavity.??? This fluid buildup is called ascites and may be caused by conditions such as liver disease, heart failure, or cancer.During this procedure, a needle is inserted into your abdomen to drain the fluid.??? The fluid may then be sent to the lab for testing if medically indicated.??? Removal of the fluid may also relieve belly pressure and shortness of breath caused by the ascites.???  POST-PROCEDURE ACTIVITY:  ??? A responsible adult must drive you home.??? ???If you receive sedation, you should not drive or operate heavy machinery or do anything that requires concentration for at least 24 hours after the procedure.  ??? It is recommended that a responsible adult be with you until morning.  ??? Rest today; you may resume normal activity tomorrow.???  POST-PROCEDURE SITE CARE:  ??? Keep the bandage dry.??? You may remove it after 24 hours.???  ??? A dry gauze bandage may be reapplied as necessary to protect your clothing as the site may sometimes leak for several days after the procedure.???  ??? You may shower in 24 hours, after the bandage is removed.???  ??? Do not submerge the site underwater for 1 week or until fully healed (no tub bath, swimming/hot tub, etc.)???  ??? Be sure your hands are clean when touching near the site.  ??? Do not use ointments, creams or powders on the puncture site.???  DIET/MEDICATIONS:  ??? You may resume your previous diet after the procedure.  ??? If you receive sedation or narcotic pain medications, avoid any foods or beverages containing alcohol for at least 24 hours after the procedure.  ??? Please see the Medication Reconciliation sheet for instructions on resuming your home medications.???  CALL THE DOCTOR IF:  ??? Bright red blood has soaked the bandage.  ??? You have severe abdominal pain unrelieved by pain medications.??? Some soreness is to be expected. ??? You have blood in your urine.  ??? You have signs of infection:  ? Chills, fever greater than 101F.  ? Increased redness, warmth or swelling at the puncture site.  ? Pus draining from the puncture site.???  ???  For any of the above symptoms or for problems or concerns related to the procedure,??? call??? [phone] for Monday-Friday 7-5.??? After-hours and weekends, please call??? [phone] and ask for the Interventional Radiology Resident on-call.  ???

## 2018-07-19 LAB — GRAM STAIN

## 2018-07-20 LAB — CELL COUNT W/DIFF-FLUIDS

## 2018-07-23 LAB — CULTURE-WOUND/TISSUE/FLUID(AEROBIC ONLY)W/SENSITIVITY

## 2018-07-30 ENCOUNTER — Encounter: Admit: 2018-07-30 | Discharge: 2018-07-30 | Payer: BC Managed Care – PPO

## 2018-07-30 DIAGNOSIS — Z01818 Encounter for other preprocedural examination: Principal | ICD-10-CM

## 2018-08-06 ENCOUNTER — Ambulatory Visit: Admit: 2018-08-06 | Discharge: 2018-08-06 | Payer: BC Managed Care – PPO

## 2018-08-06 ENCOUNTER — Encounter: Admit: 2018-08-06 | Discharge: 2018-08-06 | Payer: BC Managed Care – PPO

## 2018-08-06 DIAGNOSIS — Z01818 Encounter for other preprocedural examination: Principal | ICD-10-CM

## 2018-08-06 DIAGNOSIS — Z1159 Encounter for screening for other viral diseases: ICD-10-CM

## 2018-08-06 NOTE — Progress Notes
Patient arrived to COVID clinic for COVID-19 testing 08/06/18 1059   Patient identity confirmed via photo I.D. Nasopharyngeal procedure explained to the patient.   Nasopharyngeal swab completed right  Patient education provided given and instructed patient self isolate until contacted w/ results and further instructions.   Swab collected by Santo Held.    Date symptoms began/reason for testing: Pre-op

## 2018-08-07 ENCOUNTER — Encounter: Admit: 2018-08-07 | Discharge: 2018-08-07 | Payer: BC Managed Care – PPO

## 2018-08-07 DIAGNOSIS — N135 Crossing vessel and stricture of ureter without hydronephrosis: Secondary | ICD-10-CM

## 2018-08-07 LAB — COVID-19 (SARS-COV-2) PCR

## 2018-08-08 ENCOUNTER — Encounter: Admit: 2018-08-08 | Discharge: 2018-08-08 | Payer: BC Managed Care – PPO

## 2018-08-08 ENCOUNTER — Ambulatory Visit: Admit: 2018-08-07 | Discharge: 2018-08-07 | Payer: BC Managed Care – PPO

## 2018-08-08 ENCOUNTER — Ambulatory Visit: Admit: 2018-08-08 | Discharge: 2018-08-08 | Payer: BC Managed Care – PPO

## 2018-08-08 ENCOUNTER — Ambulatory Visit: Admit: 2018-08-08 | Discharge: 2018-08-08

## 2018-08-08 DIAGNOSIS — Z79899 Other long term (current) drug therapy: ICD-10-CM

## 2018-08-08 DIAGNOSIS — C799 Secondary malignant neoplasm of unspecified site: ICD-10-CM

## 2018-08-08 DIAGNOSIS — I454 Nonspecific intraventricular block: ICD-10-CM

## 2018-08-08 DIAGNOSIS — I868 Varicose veins of other specified sites: ICD-10-CM

## 2018-08-08 DIAGNOSIS — Z9049 Acquired absence of other specified parts of digestive tract: ICD-10-CM

## 2018-08-08 DIAGNOSIS — Z1159 Encounter for screening for other viral diseases: ICD-10-CM

## 2018-08-08 DIAGNOSIS — Z801 Family history of malignant neoplasm of trachea, bronchus and lung: ICD-10-CM

## 2018-08-08 DIAGNOSIS — Z803 Family history of malignant neoplasm of breast: ICD-10-CM

## 2018-08-08 DIAGNOSIS — N131 Hydronephrosis with ureteral stricture, not elsewhere classified: Principal | ICD-10-CM

## 2018-08-08 DIAGNOSIS — C181 Malignant neoplasm of appendix: ICD-10-CM

## 2018-08-08 DIAGNOSIS — C801 Malignant (primary) neoplasm, unspecified: ICD-10-CM

## 2018-08-08 DIAGNOSIS — N135 Crossing vessel and stricture of ureter without hydronephrosis: Principal | ICD-10-CM

## 2018-08-08 LAB — POC SODIUM: Lab: 135 MMOL/L — ABNORMAL LOW (ref 137–147)

## 2018-08-08 LAB — POC HEMATOCRIT
Lab: 26 % — ABNORMAL LOW (ref 36–45)
Lab: 8.8 g/dL — ABNORMAL LOW (ref 12.0–15.0)

## 2018-08-08 LAB — POC IONIZED CALCIUM: Lab: 1.2 MMOL/L (ref 1.0–1.3)

## 2018-08-08 LAB — POC BLOOD GAS ARTERIAL
Lab: 19 MMOL/L — ABNORMAL LOW (ref 21–28)
Lab: 43 mmHg (ref 35–45)
Lab: 65 mmHg — ABNORMAL LOW (ref 80–100)
Lab: 7.2 — ABNORMAL LOW (ref 7.35–7.45)

## 2018-08-08 LAB — POC POTASSIUM: Lab: 5.1 MMOL/L (ref 3.5–5.1)

## 2018-08-08 MED ORDER — IOPAMIDOL 61 % 50 ML IV SOLN (OT)(OSM)
0 refills | Status: DC
Start: 2018-08-08 — End: 2018-08-08
  Administered 2018-08-08: 18:00:00 50 mL via INTRAMUSCULAR

## 2018-08-08 MED ORDER — SUCCINYLCHOLINE CHLORIDE 20 MG/ML IJ SOLN
INTRAVENOUS | 0 refills | Status: DC
Start: 2018-08-08 — End: 2018-08-08
  Administered 2018-08-08: 18:00:00 65 mg via INTRAVENOUS

## 2018-08-08 MED ORDER — PROPOFOL INJ 10 MG/ML IV VIAL
0 refills | Status: DC
Start: 2018-08-08 — End: 2018-08-08
  Administered 2018-08-08: 18:00:00 120 mg via INTRAVENOUS

## 2018-08-08 MED ORDER — PHENYLEPHRINE IN 0.9% NACL(PF) 1 MG/10 ML (100 MCG/ML) IV SYRG
INTRAVENOUS | 0 refills | Status: DC
Start: 2018-08-08 — End: 2018-08-08
  Administered 2018-08-08 (×2): 100 ug via INTRAVENOUS

## 2018-08-08 MED ORDER — FENTANYL CITRATE (PF) 50 MCG/ML IJ SOLN
0 refills | Status: DC
Start: 2018-08-08 — End: 2018-08-08
  Administered 2018-08-08 (×2): 25 ug via INTRAVENOUS
  Administered 2018-08-08: 18:00:00 50 ug via INTRAVENOUS

## 2018-08-08 MED ORDER — CEFAZOLIN INJ 1GM IVP
2 g | Freq: Once | INTRAVENOUS | 0 refills | Status: CP
Start: 2018-08-08 — End: ?
  Administered 2018-08-08: 18:00:00 2 g via INTRAVENOUS

## 2018-08-08 MED ORDER — DIPHENHYDRAMINE HCL 50 MG/ML IJ SOLN
25 mg | Freq: Once | INTRAVENOUS | 0 refills | Status: DC | PRN
Start: 2018-08-08 — End: 2018-08-08

## 2018-08-08 MED ORDER — ONDANSETRON HCL (PF) 4 MG/2 ML IJ SOLN
4 mg | Freq: Once | INTRAVENOUS | 0 refills | Status: CP | PRN
Start: 2018-08-08 — End: ?
  Administered 2018-08-08: 18:00:00 4 mg via INTRAVENOUS

## 2018-08-08 MED ORDER — LIDOCAINE HCL 2 % MM JELP
0 refills | Status: DC
Start: 2018-08-08 — End: 2018-08-08
  Administered 2018-08-08: 18:00:00 20 mL via TOPICAL

## 2018-08-08 MED ORDER — FENTANYL CITRATE (PF) 50 MCG/ML IJ SOLN
50 ug | INTRAVENOUS | 0 refills | Status: DC | PRN
Start: 2018-08-08 — End: 2018-08-08

## 2018-08-08 MED ORDER — OXYCODONE 5 MG PO TAB
5-10 mg | Freq: Once | ORAL | 0 refills | Status: CP | PRN
Start: 2018-08-08 — End: ?
  Administered 2018-08-08: 19:00:00 5 mg via ORAL

## 2018-08-08 MED ORDER — DEXAMETHASONE SODIUM PHOSPHATE 4 MG/ML IJ SOLN
INTRAVENOUS | 0 refills | Status: DC
Start: 2018-08-08 — End: 2018-08-08
  Administered 2018-08-08: 18:00:00 4 mg via INTRAVENOUS

## 2018-08-08 MED ORDER — CIPROFLOXACIN HCL 500 MG PO TAB
500 mg | ORAL_TABLET | Freq: Two times a day (BID) | ORAL | 0 refills | 10.00000 days | Status: AC
Start: 2018-08-08 — End: 2018-08-14

## 2018-08-08 MED ORDER — SODIUM CHLORIDE 0.9 % IV SOLP
1000 mL | INTRAVENOUS | 0 refills | Status: DC
Start: 2018-08-08 — End: 2018-08-08
  Administered 2018-08-08: 17:00:00 1000 mL via INTRAVENOUS

## 2018-08-08 MED ORDER — DEXTRAN 70-HYPROMELLOSE (PF) 0.1-0.3 % OP DPET
0 refills | Status: DC
Start: 2018-08-08 — End: 2018-08-08
  Administered 2018-08-08: 18:00:00 2 [drp] via OPHTHALMIC

## 2018-08-08 MED ORDER — HEPARIN, PORCINE (PF) 100 UNIT/ML IV SYRG
500 [IU] | Freq: Once | 0 refills | Status: CP
Start: 2018-08-08 — End: ?

## 2018-08-08 MED ORDER — CEFAZOLIN INJ 1GM IVP
2 g | Freq: Once | INTRAVENOUS | 0 refills | Status: CN
Start: 2018-08-08 — End: ?

## 2018-08-08 MED ORDER — FAMOTIDINE (PF) 20 MG/2 ML IV SOLN
0 refills | Status: DC
Start: 2018-08-08 — End: 2018-08-08
  Administered 2018-08-08: 18:00:00 20 mg via INTRAVENOUS

## 2018-08-08 MED ORDER — FENTANYL CITRATE (PF) 50 MCG/ML IJ SOLN
25 ug | INTRAVENOUS | 0 refills | Status: DC | PRN
Start: 2018-08-08 — End: 2018-08-08

## 2018-08-08 MED ORDER — PROMETHAZINE 25 MG/ML IJ SOLN
6.25 mg | INTRAVENOUS | 0 refills | Status: DC | PRN
Start: 2018-08-08 — End: 2018-08-08

## 2018-08-09 ENCOUNTER — Encounter: Admit: 2018-08-09 | Discharge: 2018-08-09 | Payer: BC Managed Care – PPO

## 2018-08-10 ENCOUNTER — Encounter: Admit: 2018-08-10 | Discharge: 2018-08-10 | Payer: BC Managed Care – PPO

## 2018-08-10 DIAGNOSIS — C801 Malignant (primary) neoplasm, unspecified: ICD-10-CM

## 2018-08-10 DIAGNOSIS — I454 Nonspecific intraventricular block: ICD-10-CM

## 2018-08-10 DIAGNOSIS — C181 Malignant neoplasm of appendix: Principal | ICD-10-CM

## 2018-08-11 LAB — CULTURE-URINE W/SENSITIVITY
Lab: >10
Lab: >10 — AB

## 2018-08-13 ENCOUNTER — Encounter: Admit: 2018-08-13 | Discharge: 2018-08-13 | Payer: BC Managed Care – PPO

## 2018-08-13 NOTE — Research Notes
Study: IIT-[id number]  HSC: [id number]    Study coordinator attempt to call pt for 60 day follow-up visit. Left message for her to call back.

## 2018-08-14 ENCOUNTER — Encounter: Admit: 2018-08-14 | Discharge: 2018-08-14 | Payer: BC Managed Care – PPO

## 2018-08-14 MED ORDER — SULFAMETHOXAZOLE-TRIMETHOPRIM 800-160 MG PO TAB
1 | ORAL_TABLET | Freq: Two times a day (BID) | ORAL | 0 refills | Status: DC
Start: 2018-08-14 — End: 2018-11-24

## 2018-08-16 ENCOUNTER — Encounter: Admit: 2018-08-16 | Discharge: 2018-08-16 | Payer: BC Managed Care – PPO

## 2018-08-29 ENCOUNTER — Encounter: Admit: 2018-08-29 | Discharge: 2018-08-29

## 2018-08-30 ENCOUNTER — Encounter: Admit: 2018-08-30 | Discharge: 2018-08-30

## 2018-08-30 MED ORDER — NITROFURANTOIN MONOHYD/M-CRYST 100 MG PO CAP
100 mg | ORAL_CAPSULE | Freq: Two times a day (BID) | ORAL | 0 refills | 7.00000 days | Status: DC
Start: 2018-08-30 — End: 2018-11-24

## 2018-09-05 ENCOUNTER — Encounter: Admit: 2018-09-05 | Discharge: 2018-09-05

## 2018-09-06 ENCOUNTER — Ambulatory Visit: Admit: 2018-09-06 | Discharge: 2018-09-06

## 2018-09-06 DIAGNOSIS — N135 Crossing vessel and stricture of ureter without hydronephrosis: Secondary | ICD-10-CM

## 2018-09-23 ENCOUNTER — Encounter: Admit: 2018-09-23 | Discharge: 2018-09-23

## 2018-10-16 ENCOUNTER — Encounter: Admit: 2018-10-16 | Discharge: 2018-10-16

## 2018-10-16 NOTE — Progress Notes
[facility] phone triage completed with patient for surgery on 10/31/18 with Dr. Morene Crocker. Medications, allergies and medical history reviewed and updated in chart. Had last stent exchange on 08/08/18 and denies cardiac or pulmonary changes to medical status. She did have surgery at OSH for her appendiceal cancer - total colectomy, splenectomy, omentectomy, TAH/BSO and states that she is currently on IV TPN. She was told that they "removed 98% of her cancer". She is still recovering from the surgery but can climb stairs and walk several blocks without CP/DOE. Denies URI symptoms at this time. No PAC visit indicated.    Preop and medication instructions reviewed with patient. No vitamins or supplements for 14 days and no NSAIDS for 7 days before surgery. She is currently taking Flexeril and Oxycodone  for pain and advised ok to take on DOS if needed. NPO after 11 pm the night before surgery but ok to drink water until 2 hours before arrival at hospital. Patient verbalized understanding and declined copy of instructions

## 2018-10-28 ENCOUNTER — Encounter: Admit: 2018-10-28 | Discharge: 2018-10-28

## 2018-10-30 ENCOUNTER — Encounter: Admit: 2018-10-30 | Discharge: 2018-10-30

## 2018-11-02 ENCOUNTER — Encounter: Admit: 2018-11-02 | Discharge: 2018-11-02

## 2018-11-05 ENCOUNTER — Encounter: Admit: 2018-11-05 | Discharge: 2018-11-05

## 2018-11-05 DIAGNOSIS — C786 Secondary malignant neoplasm of retroperitoneum and peritoneum: Secondary | ICD-10-CM

## 2018-11-05 NOTE — Telephone Encounter
Call to Vicki Buchanan left voice message regarding scheduled appointments on 11/06/2018 at 3:00pm with 2:30pm arrival time and 11/13/2018 at 10:20am.

## 2018-11-05 NOTE — Telephone Encounter
-----   Message from Alexis Goodell, RN sent at 11/05/2018  8:56 AM CDT -----  Please call patient and schedule CT and return with Dr. Durwin Nora on 8/26.    Thank you!

## 2018-11-06 ENCOUNTER — Encounter: Admit: 2018-11-06 | Discharge: 2018-11-06

## 2018-11-06 DIAGNOSIS — C786 Secondary malignant neoplasm of retroperitoneum and peritoneum: Secondary | ICD-10-CM

## 2018-11-13 ENCOUNTER — Encounter: Admit: 2018-11-13 | Discharge: 2018-11-13

## 2018-11-13 DIAGNOSIS — C181 Malignant neoplasm of appendix: Secondary | ICD-10-CM

## 2018-11-13 DIAGNOSIS — C801 Malignant (primary) neoplasm, unspecified: Secondary | ICD-10-CM

## 2018-11-13 DIAGNOSIS — I454 Nonspecific intraventricular block: Secondary | ICD-10-CM

## 2018-11-13 DIAGNOSIS — N135 Crossing vessel and stricture of ureter without hydronephrosis: Secondary | ICD-10-CM

## 2018-11-13 DIAGNOSIS — C786 Secondary malignant neoplasm of retroperitoneum and peritoneum: Secondary | ICD-10-CM

## 2018-11-13 LAB — COMPREHENSIVE METABOLIC PANEL
Lab: 0.3 mg/dL (ref 0.3–1.2)
Lab: 1.4 mg/dL — ABNORMAL HIGH (ref 0.4–1.00)
Lab: 101 mg/dL — ABNORMAL HIGH (ref 70–100)
Lab: 131 MMOL/L — ABNORMAL LOW (ref 137–147)
Lab: 19 U/L — ABNORMAL HIGH (ref 7–40)
Lab: 26 MMOL/L (ref 21–30)
Lab: 27 U/L — ABNORMAL HIGH (ref 7–56)
Lab: 3.2 g/dL — ABNORMAL LOW (ref 3.5–5.0)
Lab: 37 mL/min — ABNORMAL LOW (ref >60)
Lab: 4.8 MMOL/L (ref 3.5–5.1)
Lab: 44 mL/min — ABNORMAL LOW (ref >60)
Lab: 485 U/L — ABNORMAL HIGH (ref 25–110)
Lab: 52 mg/dL — ABNORMAL HIGH (ref 7–25)
Lab: 7 10*3/uL (ref 3–12)
Lab: 8.1 g/dL — ABNORMAL HIGH (ref 6.0–8.0)
Lab: 9.2 mg/dL (ref 8.5–10.6)
Lab: 98 MMOL/L — ABNORMAL LOW (ref 98–110)

## 2018-11-13 LAB — CEA(CARCINOEMBRYONIC AG): Lab: 226 ng/mL — ABNORMAL HIGH (ref <3.0)

## 2018-11-13 LAB — CBC AND DIFF
Lab: 12 10*3/uL — ABNORMAL HIGH (ref 4.5–11.0)
Lab: 3.9 M/UL — ABNORMAL LOW (ref 4.0–5.0)
Lab: 36 % (ref 36–45)

## 2018-11-13 MED ORDER — IOHEXOL 350 MG IODINE/ML IV SOLN
100 mL | Freq: Once | INTRAVENOUS | 0 refills | Status: CP
Start: 2018-11-13 — End: ?
  Administered 2018-11-13: 16:00:00 100 mL via INTRAVENOUS

## 2018-11-13 MED ORDER — SODIUM CHLORIDE 0.9 % IJ SOLN
50 mL | Freq: Once | INTRAVENOUS | 0 refills | Status: CP
Start: 2018-11-13 — End: ?
  Administered 2018-11-13: 16:00:00 50 mL via INTRAVENOUS

## 2018-11-13 MED ORDER — HEPARIN, PORCINE (PF) 100 UNIT/ML IV SYRG
500 [IU] | Freq: Once | 0 refills | Status: CP
Start: 2018-11-13 — End: ?

## 2018-11-13 NOTE — Progress Notes
Date of Service: 11/13/2018      Subjective     Reason for Visit:  Follow Up      Vicki Buchanan is a 63 y.o. female.    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      She was seen by her PCP on 02/14/2017 with intermittent but recurrent abdominal cramping, bloating and diarrhea. Patient reports 6-7 months ago she began having episodes of abdominal cramping that progressed in severity until it caused her to vomit multiple times until it was dry heaving. This was followed by diarrhea the next morning. Patient continued to have abdominal cramping and bloating with alternating watery diarrhea and constipation.  Upon examination she was noted decreased bowel sounds, mass (multiple firm lesions noted on deep palpation to RLQ, mid upper quadrant, and LLQ, non tender to palpation). She is tender with firm mass noted RUQ, LUQ and LLQ. A CT scan was done on 02/14/2017 that demonstrated large volume of abdominopelvic ascites, omental caking, evidence of pelvic peritoneal  enhancing nodularity suspicious for peritoneal carcinomatosis, and hepatic tumor implantation. Findings are most suspicious  for underling ovarian or gastrointestinal neoplasm.     She was then seen by a General Surgeon and on 02/19/2017 she underwent diagnostic laparoscopy with biopsies of right diaphragm, right liver, right fallopian tube and left pelvis. Pathology showed involvement by mucinous neoplasm with low-grade features in all four biopsy specimens. CA 125 on 02/27/2017 was 90.4 and CEA is 1621.0.    Findings at the time of surgery on 02/19/17 by Dr. Jonah Buchanan: A total of 1.7 L of fluid was removed and the peritoneal cavity.  It was slightly turbid and hemorrhagic in color.  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  There was bulky disease in the pelvis, scattered non-bulky disease of the right hemidiaphragm and on the capsule liver.  Left hemidiaphragm appear to be spared.  On the parietal peritoneum it was started with areas suspicious for carcinomatosis.  The stomach was observed.  There were no evident masses that could be discerned externally.  There was bulky disease in the pelvis over the bladder and a large, what appeared to be, right tubo-ovarian mass with heavy disposition of tumor in that area.  She had also an area of the right fimbria of the fallopian tube with a large to positive tumor.  A portion of the right fallopian tube and fimbria were removed with ligature device.  Biopsies left pelvis were taken of the left pelvic sidewall.  Biopsies were also taken of the serosal surface of the liver and the right hemidiaphragm.     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 This was low grade mucinous neoplasm which was focally positive for CK7, and negative for PAX-8. Cytology from abdominal fluid was negative for malignancy.     She was taken to the OR on 04/03/2017 for potential cytoreductive surgery and hyperthermic intraperitoneal chemotherapy (CRS and HIPEC). 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  The process involved the entire mesentery of the small and large bowel as well as the root of the mesentery.  Almost all of the peritoneal surfaces had disease.  Unfortunately, the mesenteric disease was infiltrative to an extent where it would have required too much of bowel resection in order to remove the disease. A diverting loop ileostomy was performed  to help alleviate obstructive symptoms. Pathology described mucinous carcinoma with low-grade morphology and occasional high-grade areas (G2 and focal G3 with focal signet ring cells).    She received FOLFOX + Avastin since February-September 2019 under the direction of Dr. Josiah Buchanan. She began a clinical trial in Sept 2019 with SIROLIMUS + EPACADOSTAT.    She stopped the study drug on 05/21/18 as she chose to enroll in the Mitomycin HIPEC trial at [facility] and completed her first laparoscopic HIPEC on 05/28/18 and second laparoscopic HIPEC on 06/25/18.     She continued to have significant pelvic discomfort and urinary frequency due to the large pelvic mass. She sought an opinion from Dr. Windell Buchanan in [place], [place]. On   09/12/2018, she underwent an exp Lap, cytoreductive debulking surgery for metastatic mucinous adenocarcinoma of the appendix, total colectomy, splenectomy, omentectomy, TAH/BSO and HIPEC with Mitomycin-C. She underwent a CC-3 cytoreduction with significant amount of visible and palpable disease remaining.      She presents today with our clinic with the request to continue follow up with our office as well as continued follow up with Dr. Geraldo Buchanan office for surgical oncology care. She is seeing Dr. Hortencia Buchanan weekly via telehealth for continued postoperative care. She is focusing on hydration and nutrition postoperatively.    CT chest/abd/pelvis on 11/12/2018:  CHEST:   1. ???Enlarging small right apical nodule consistent with metastatic   disease.   2. ???Development of a small anterior diaphragmatic lymph node, which may   represent a nodal metastasis.   3. ???Slight decrease in paraesophageal fluid collection in the lower   mediastinum.   4. ???Development of a small left pleural effusion.   5. ???Placement of a right-sided PICC which courses through the internal   jugular vein out of the superior field-of-view.     ABDOMEN AND PELVIS:   1. ???Interval omentectomy, tumor debulking, splenectomy, subtotal   colectomy, hysterectomy and bilateral salpingo-oophorectomy.   2. ???Diffuse hepatic capsular implants likely are not significantly   changed. Continued thickening and ascites is seen involving the peritoneal   reflections consistent with peritoneal carcinomatosis. 3. ???Development of a peripherally enhancing thick-walled trans-spatial gas   and fluid collection centered in the anterior pelvis, with percutaneous   catheter in place. This may represent gas within loculated ascites or a   postoperative fluid collection, or abscess. This communicates with a more   lateral fluid collection along the lateral left hemipelvis.   4. ???Removal bilateral nephroureteral stents. No hydronephrosis.        Review of Systems   Constitutional: Positive for appetite change (improved) and fatigue (stable). Negative for activity change, fever and unexpected weight change.   HENT: Negative for mouth sores, sore throat and trouble swallowing.    Eyes: Negative for visual disturbance.   Respiratory: Negative for cough and shortness of breath.    Cardiovascular: Negative for chest pain.   Gastrointestinal: Positive for abdominal distention. Negative for blood in stool, constipation, diarrhea, nausea and vomiting.   Genitourinary: Negative for difficulty urinating and hematuria.   Musculoskeletal: Negative for arthralgias and back pain.   Skin: Negative for color change, pallor, rash and wound.   Neurological: Positive for numbness (upper thighs). Negative for dizziness, light-headedness and headaches.   Hematological: Does not bruise/bleed easily.   Psychiatric/Behavioral: Negative for agitation. The patient is nervous/anxious.      The remainder of the complete 12-point comprehensive ROS is otherwise entirely negative.    Medical History:   Diagnosis Date   ???  Bundle branch block    ??? Cancer (HCC)    ??? Cancer of appendix [facility])        Surgical History:   Procedure Laterality Date   ??? CESAREAN SECTION  1981   ??? HX CHOLECYSTECTOMY  1995   ??? LAPAROSCOPY  02/2017    biopsy peritoneal   ??? COLONOSCOPY DIAGNOSTIC WITH SPECIMEN COLLECTION BY BRUSHING/ WASHING - FLEXIBLE N/A 03/19/2017    Performed by Everardo All, MD at [facility] ENDO   ??? COLONOSCOPY WITH BIOPSY - FLEXIBLE  03/19/2017 Performed by Everardo All, MD at [facility] ENDO   ??? EXPLORATORY LAPAROTOMY, DIVERTING LOOP ILEOSTOMY N/A 04/03/2017    Performed by Loralie Champagne, MD at [facility] OR   ??? INSERTION TUNNELED CENTRAL VENOUS CATHETER - AGE 33 YEARS AND OVER Left 04/23/2017    Performed by Loralie Champagne, MD at [facility] OR   ??? CYSTOURETHROSCOPY WITH INDWELLING URETERAL STENT INSERTION Bilateral 04/09/2018    Performed by Earl Many, MD at [facility] OR   ??? CYSTOURETHROSCOPY WITH URETERAL CATHETERIZATION WITH/ WITHOUT IRRIGATION/ INSTILLATION/ URETEROPYELOGRAPHY Bilateral 04/09/2018    Performed by Earl Many, MD at [facility] OR   ??? CYSTOURETHROSCOPY WITH INDWELLING URETERAL STENT EXCHANGE (RIGHT 6 x 26cm / LEFT 6 x 28cm) Bilateral 08/08/2018    Performed by Earl Many, MD at [facility] OR   ??? RETROGRADE UROGRAPHY WITH/ WITHOUT KUB Bilateral 08/08/2018    Performed by Earl Many, MD at [facility] OR   ??? ILEOSTOMY OR JEJUNOSTOMY Right    ??? TUNNELED VENOUS PORT PLACEMENT Left    ??? URETER STENT PLACEMENT Bilateral        Family History   Problem Relation Age of Onset   ??? Diabetes Mother    ??? Cancer Father    ??? Diabetes Brother    ??? Cancer-Lung Paternal Aunt    ??? Cancer-Breast Maternal Grandmother    ??? Diabetes Paternal Grandmother        Social History     Socioeconomic History   ??? Marital status: Married     Spouse name: Not on file   ??? Number of children: Not on file   ??? Years of education: Not on file   ??? Highest education level: Not on file   Occupational History   ??? Not on file   Tobacco Use   ??? Smoking status: Never Smoker   ??? Smokeless tobacco: Never Used   Substance and Sexual Activity   ??? Alcohol use: No     Frequency: Never     Comment: rarely   ??? Drug use: No   ??? Sexual activity: Not on file   Other Topics Concern   ??? Not on file   Social History Narrative   ??? Not on file         Objective:  ??? acetaminophen (TYLENOL) 500 mg tablet Take 1,000 mg by mouth twice daily as needed for Pain. Max of 4,000 mg of acetaminophen in 24 hours. ??? cetirizine (ZYRTEC) 10 mg tablet Take 10 mg by mouth every morning.   ??? dexamethasone (DECADRON) 4 mg tablet Take with food. Take twice per day on Days 2 and 3 after chemo   ??? famotidine (PEPCID PO) Take  by mouth twice daily.   ??? hyoscyamine sulfate (LEVSIN/SL) 0.125 mg sublingual tablet Place one tablet under tongue every 4 hours as needed (Bladder Spasms).   ??? lidocaine/prilocaine (EMLA) 2.5/2.5 % topical cream Apply small amount over port one hour prior to accessing.  Cover with plastic  wrap to protect clothes.   ??? LORazepam (ATIVAN) 0.5 mg tablet Take 1 tablet every 6 hours as needed for nausea/vomiting or anxiety. May also use at bedtime as needed for insomnia.   ??? nitrofurantoin monohyd/m-cryst (MACROBID) 100 mg capsule Take one capsule by mouth every 12 hours. Take with food.   ??? ondansetron (ZOFRAN ODT) 8 mg rapid dissolve tablet 1 tablet PO BID with meals on days 2 & 3 following chemotherapy. Then use q 8 h PRN nausea or vomiting.   ??? oxybutynin chloride (DITROPAN) 5 mg tablet TAKE 1 TABLET BY MOUTH THREE TIMES DAILY AS NEEDED BLADDER  SPASM, UNINARY URGENCY/FREQUENCY. MAY CAUSE CONSTIPATION   ??? oxyCODONE (ROXICODONE) 5 mg tablet Take one tablet by mouth every 6 hours as needed for Pain   ??? tamsulosin (FLOMAX) 0.4 mg capsule Take one capsule by mouth at bedtime daily for 360 days. Do not crush, chew or open capsules. Take 30 minutes following the same meal each day. FOR STENT PAIN. Take until 3 days after stent removed.   ??? trimethoprim/sulfamethoxazole (BACTRIM DS) 160/800 mg tablet Take one tablet by mouth twice daily.       Vitals:    11/13/18 1253   BP: 120/83   Pulse: 82   Resp: 16   Temp: 36.4 ???C (97.6 ???F)   SpO2: 97%   Weight: 38.2 kg (84 lb 3.2 oz)   Height: 157.5 cm (62)   PainSc: Zero       Body mass index is 15.4 kg/m???.    Pain Score: Zero       Pain Rating:          Pain Addressed:  N/A    Patient Evaluated for a Clinical Trial: No treatment clinical trial available for this patient. [place] Cooperative Oncology Group performance status is 2, Ambulatory and capable of all selfcare but unable to carry out any work activities. Up and about more than 50% of waking hours.     Physical Exam  Vitals signs reviewed.   Constitutional:       General: She is not in acute distress.     Appearance: She is well-developed. She is cachectic. She is not ill-appearing, toxic-appearing or diaphoretic.      Comments: Somewhat cachectic and weak, in good spirits   HENT:      Head: Normocephalic and atraumatic.      Mouth/Throat:      Mouth: Mucous membranes are moist.      Pharynx: Oropharynx is clear. No oropharyngeal exudate or posterior oropharyngeal erythema.   Eyes:      General: No scleral icterus.        Right eye: No discharge.         Left eye: No discharge.      Extraocular Movements: Extraocular movements intact.      Conjunctiva/sclera: Conjunctivae normal.      Pupils: Pupils are equal, round, and reactive to light.   Neck:      Musculoskeletal: Normal range of motion. No neck rigidity or muscular tenderness.      Thyroid: No thyromegaly.      Vascular: No JVD.      Trachea: No tracheal deviation.   Cardiovascular:      Rate and Rhythm: Normal rate and regular rhythm.      Heart sounds: Normal heart sounds.   Pulmonary:      Effort: Pulmonary effort is normal. No respiratory distress.      Breath sounds: Normal breath sounds. No  stridor. No wheezing.   Abdominal:      General: Abdomen is flat. Bowel sounds are normal. There is no distension.      Palpations: Abdomen is soft. There is no mass.      Tenderness: There is no abdominal tenderness. There is no guarding or rebound.      Hernia: No hernia is present.   Musculoskeletal: Normal range of motion.         General: No tenderness, deformity or signs of injury.   Lymphadenopathy:      Cervical: No cervical adenopathy.   Skin:     General: Skin is warm and dry.      Coloration: Skin is not jaundiced or pale.      Findings: No erythema. Neurological:      General: No focal deficit present.      Mental Status: She is alert and oriented to person, place, and time. Mental status is at baseline.   Psychiatric:         Mood and Affect: Mood normal.         Behavior: Behavior normal.         Thought Content: Thought content normal.         Judgment: Judgment normal.             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      She was seen by her PCP on 02/14/2017 with intermittent but recurrent abdominal cramping, bloating and diarrhea. Patient reports 6-7 months ago she began having episodes of abdominal cramping that progressed in severity until it caused her to vomit multiple times until it was dry heaving. This was followed by diarrhea the next morning. Patient continued to have abdominal cramping and bloating with alternating watery diarrhea and constipation.  Upon examination she was noted decreased bowel sounds, mass (multiple firm lesions noted on deep palpation to RLQ, mid upper quadrant, and LLQ, non tender to palpation). She is tender with firm mass noted RUQ, LUQ and LLQ. A CT scan was done on 02/14/2017 that demonstrated large volume of abdominopelvic ascites, omental caking, evidence of pelvic peritoneal  enhancing nodularity suspicious for peritoneal carcinomatosis, and hepatic tumor implantation. Findings are most suspicious  for underling ovarian or gastrointestinal neoplasm.     She was then seen by a General Surgeon and on 02/19/2017 she underwent diagnostic laparoscopy with biopsies of right diaphragm, right liver, right fallopian tube and left pelvis. Pathology showed involvement by mucinous neoplasm with low-grade features in all four biopsy specimens. CA 125 on 02/27/2017 was 90.4 and CEA is 1621.0. Findings at the time of surgery on 02/19/17 by Dr. Jonah Buchanan: A total of 1.7 L of fluid was removed and the peritoneal cavity.  It was slightly turbid and hemorrhagic in color.  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  There was bulky disease in the pelvis, scattered non-bulky disease of the right hemidiaphragm and on the capsule liver.  Left hemidiaphragm appear to be spared.  On the parietal peritoneum it was started with areas suspicious for carcinomatosis.  The stomach was observed.  There were no evident masses that could be discerned externally.  There was bulky disease in the pelvis over the bladder and a large, what appeared to be, right tubo-ovarian mass with heavy disposition of tumor in that area.  She had also an area of the right fimbria of the fallopian tube with a large to positive tumor.  A portion of the right fallopian tube and fimbria were removed with ligature device.  Biopsies left pelvis were taken of the left pelvic sidewall.  Biopsies were also taken of the serosal surface of the liver and the right hemidiaphragm.     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 This was low grade mucinous neoplasm which was focally positive for CK7, and negative for PAX-8. Cytology from abdominal fluid was negative for malignancy.     She was taken to the OR on 04/03/2017 for potential cytoreductive surgery and hyperthermic intraperitoneal chemotherapy (CRS and HIPEC). 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  The process involved the entire mesentery of the small and large bowel as well as the root of the mesentery.  Almost all of the peritoneal surfaces had disease.  Unfortunately, the mesenteric disease was infiltrative to an extent where it would have required too much of bowel resection in order to remove the disease. A diverting loop ileostomy was performed to help alleviate obstructive symptoms. Pathology described mucinous carcinoma with low-grade morphology and occasional high-grade areas (G2 and focal G3 with focal signet ring cells).    She received FOLFOX + Avastin since February-September 2019 under the direction of Dr. Josiah Buchanan. She began a clinical trial in Sept 2019 with SIROLIMUS + EPACADOSTAT.    She stopped the study drug on 05/21/18 as she chose to enroll in the Mitomycin HIPEC trial at [facility] and completed her first laparoscopic HIPEC on 05/28/18 and second laparoscopic HIPEC on 06/25/18.     She continued to have significant pelvic discomfort and urinary frequency due to the large pelvic mass. She sought an opinion from Dr. Windell Buchanan in [place], [place]. On   09/12/2018, she underwent an exp Lap, cytoreductive debulking surgery for metastatic mucinous adenocarcinoma of the appendix, total colectomy, splenectomy, omentectomy, TAH/BSO and HIPEC with Mitomycin-C. She underwent a CC-3 cytoreduction with significant amount of visible and palpable disease remaining.      - Continue postoperative care with Dr. Hortencia Buchanan  - Medical oncology with Dr. Josiah Buchanan  - RTC 6 months for continuum of care  - Pt was evaluated and plan developed in conjunction with Dr. Loralie Champagne.    Vicki Kapur, PA-C    ATTESTATION    I personally performed the key portions of the E/M visit, discussed case with Physician Assistant and concur with documentation of history, physical exam, assessment, and treatment plan unless otherwise noted.     Vicki Buchanan is slowly recovering from her operation in [place], cytoreductive surgery and hyperthermic intraperitoneal chemotherapy (CRS and HIPEC) by Dr. Hortencia Buchanan. I will continue to follow her while she is in [place]. The rationale for this approach was discussed with the patient, who understood and seemed comfortable with the plan and recommendations.  I answered all questions to the patient's satisfaction. Staff name:  Dia Crawford, MD Date:  11/16/2018

## 2018-11-13 NOTE — Progress Notes
Per pt request, pt would like her labs/CT CDs sent to Dr. Angelica Ran (fax #: [phone]), Dr. Wallace Going (fax #: [phone]) and Dr. Shon Hough (Fax #: [phone]). This RN sent lab results to all doctors provided above. This RN instructed pt to go to Radiology to obtain CDs of CT scans to be sent to her PCP. Pt was agreeable

## 2018-11-13 NOTE — Progress Notes
Date of Service: 11/13/2018      Subjective:             Reason for Visit:  Follow Up      Vicki Buchanan is a 63 y.o. female here for 30 day follow-up for Phase I study of epicadostat ([id number]) in combination with sirolimus in advanced malignancy.HSC:???141107, being treated for appendiceal cancer. She stopped the study drug on 05/21/18 as she chose to enroll in the Mitomycin HIPEC trial at [facility] and completed her first laparoscopic HIPEC on 05/28/18 and second laparoscopic HIPEC on 06/25/18.  She completed EOT on 3/23.  States she feels good, has been eating better, gaining weight and is urinating better.  Still has upper thigh numbness that has been present since stent placement in January 2020 but she manages this with topical lotion, massage and leg elevation.  No new complaints.         Cancer Staging  No matching staging information was found for the patient.    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 02/19/2017 diagnostic laparoscopy with biopsies of the right diaphragm, right liver, right fallopian tube and left pelvis. ???Pathology???was positive for mucinous neoplasm with low-grade features.  Plans for exploratory laparotomy with cytoreductive surgery and hyperthermic intraperitoneal chemotherapy in combination with a total abdominal hysterectomy and bilateral salpingo-oophorectomy in Jan. 2019.  Before this happened she was admitted to the hospital for worsening symptoms of intestinal obstruction (frequent nausea vomiting abdominal pain and distention).  She was taken to the OR with the plan for CRS/HIPEC surgery however after exploratory laparotomy it found that there was multiple adhesions, omental cracking. CRS/HIPEC was aborted, multiple omental biopsies and ileostomy performed.    12/24/2017 - 05/21/2018 was on the epicadostat-sirolimus early phase trial.   She then decided to enroll in Mitomycin HIPEC trial at [facility] [place].  On 05/28/2018 patient underwent diagnostic laparoscopy, evacuation of mucinous ascites 1.5 L, peritoneal biopsy, laparoscopic HIPEC with mitomycin-C in [place] by Dr. Daryl Eastern. 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    2nd laparoscopic HIPEC completed in [place] on 06/25/18. About 1.5L was removed.      On 09/12/18 She underwent debulking at [facility] in [place], [place]. Removed 98% by Dr. [name]. Lost almost ~50 lbs. She was hospitalized for 18 days. Discharged on 09/30/18. She had L. pleural effusion requiring chest tube drainage. She has intra-abd-abscess in Aug, she has drain in place, discharged on 11/04/18. Still on Abx (Cefipeme and Diflucan) will complete 11/19/18 via Dr. Nathanial Millman. She has lost more weight which she attributes to post -op fluid loss. Appetite is good but feels full. Eats multiple times/day.  Her energy is low. No fever. No pain. Her renal stents were removed in Aug.    Past Medical / Surgical / Family / Social Histories : Reviewed and unchanged from previous clinic notes.      Patient would like to continue to follow with Dr. Josiah Lobo and Dr. Rainey Pines.       Review of Systems   Constitutional: Positive for appetite change (improved) and fatigue (stable). Negative for activity change, fever and unexpected weight change.   HENT: Negative for mouth sores, sore throat and trouble swallowing.    Eyes: Negative for visual disturbance.   Respiratory: Negative for cough and shortness of breath.    Cardiovascular: Negative for chest pain.   Gastrointestinal: Positive for abdominal distention. Negative for blood in stool, constipation, diarrhea, nausea and vomiting.  Genitourinary: Negative for difficulty urinating and hematuria.   Musculoskeletal: Negative for arthralgias and back pain.   Skin: Negative for color change, pallor, rash and wound.   Neurological: Positive for numbness (upper thighs). Negative for dizziness, light-headedness and headaches. Hematological: Does not bruise/bleed easily.   Psychiatric/Behavioral: Negative for agitation. The patient is nervous/anxious.        Medical History:   Diagnosis Date   ??? Bundle branch block    ??? Cancer (HCC)    ??? Cancer of appendix [facility])      Surgical History:   Procedure Laterality Date   ??? CESAREAN SECTION  1981   ??? HX CHOLECYSTECTOMY  1995   ??? LAPAROSCOPY  02/2017    biopsy peritoneal   ??? COLONOSCOPY DIAGNOSTIC WITH SPECIMEN COLLECTION BY BRUSHING/ WASHING - FLEXIBLE N/A 03/19/2017    Performed by Everardo All, MD at [facility] ENDO   ??? COLONOSCOPY WITH BIOPSY - FLEXIBLE  03/19/2017    Performed by Everardo All, MD at [facility] ENDO   ??? EXPLORATORY LAPAROTOMY, DIVERTING LOOP ILEOSTOMY N/A 04/03/2017    Performed by Loralie Champagne, MD at [facility] OR   ??? INSERTION TUNNELED CENTRAL VENOUS CATHETER - AGE 58 YEARS AND OVER Left 04/23/2017    Performed by Loralie Champagne, MD at [facility] OR   ??? CYSTOURETHROSCOPY WITH INDWELLING URETERAL STENT INSERTION Bilateral 04/09/2018    Performed by Earl Many, MD at [facility] OR   ??? CYSTOURETHROSCOPY WITH URETERAL CATHETERIZATION WITH/ WITHOUT IRRIGATION/ INSTILLATION/ URETEROPYELOGRAPHY Bilateral 04/09/2018    Performed by Earl Many, MD at [facility] OR   ??? CYSTOURETHROSCOPY WITH INDWELLING URETERAL STENT EXCHANGE (RIGHT 6 x 26cm / LEFT 6 x 28cm) Bilateral 08/08/2018    Performed by Earl Many, MD at [facility] OR   ??? RETROGRADE UROGRAPHY WITH/ WITHOUT KUB Bilateral 08/08/2018    Performed by Earl Many, MD at [facility] OR   ??? ILEOSTOMY OR JEJUNOSTOMY Right    ??? TUNNELED VENOUS PORT PLACEMENT Left    ??? URETER STENT PLACEMENT Bilateral      Family History   Problem Relation Age of Onset   ??? Diabetes Mother    ??? Cancer Father    ??? Diabetes Brother    ??? Cancer-Lung Paternal Aunt    ??? Cancer-Breast Maternal Grandmother    ??? Diabetes Paternal Grandmother      Social History     Socioeconomic History   ??? Marital status: Married     Spouse name: Not on file   ??? Number of children: Not on file ??? Years of education: Not on file   ??? Highest education level: Not on file   Occupational History   ??? Not on file   Tobacco Use   ??? Smoking status: Never Smoker   ??? Smokeless tobacco: Never Used   Substance and Sexual Activity   ??? Alcohol use: No     Frequency: Never     Comment: rarely   ??? Drug use: No   ??? Sexual activity: Not on file   Other Topics Concern   ??? Not on file   Social History Narrative   ??? Not on file         Objective:         ??? acetaminophen (TYLENOL) 500 mg tablet Take 1,000 mg by mouth twice daily as needed for Pain. Max of 4,000 mg of acetaminophen in 24 hours.    ??? cetirizine (ZYRTEC) 10 mg tablet Take 10 mg by mouth every morning.   ???  dexamethasone (DECADRON) 4 mg tablet Take with food. Take twice per day on Days 2 and 3 after chemo   ??? famotidine (PEPCID PO) Take  by mouth twice daily.   ??? hyoscyamine sulfate (LEVSIN/SL) 0.125 mg sublingual tablet Place one tablet under tongue every 4 hours as needed (Bladder Spasms).   ??? lidocaine/prilocaine (EMLA) 2.5/2.5 % topical cream Apply small amount over port one hour prior to accessing.  Cover with plastic wrap to protect clothes.   ??? LORazepam (ATIVAN) 0.5 mg tablet Take 1 tablet every 6 hours as needed for nausea/vomiting or anxiety. May also use at bedtime as needed for insomnia.   ??? nitrofurantoin monohyd/m-cryst (MACROBID) 100 mg capsule Take one capsule by mouth every 12 hours. Take with food.   ??? ondansetron (ZOFRAN ODT) 8 mg rapid dissolve tablet 1 tablet PO BID with meals on days 2 & 3 following chemotherapy. Then use q 8 h PRN nausea or vomiting.   ??? oxybutynin chloride (DITROPAN) 5 mg tablet TAKE 1 TABLET BY MOUTH THREE TIMES DAILY AS NEEDED BLADDER  SPASM, UNINARY URGENCY/FREQUENCY. MAY CAUSE CONSTIPATION   ??? oxyCODONE (ROXICODONE) 5 mg tablet Take one tablet by mouth every 6 hours as needed for Pain   ??? tamsulosin (FLOMAX) 0.4 mg capsule Take one capsule by mouth at bedtime daily for 360 days. Do not crush, chew or open capsules. Take 30 minutes following the same meal each day. FOR STENT PAIN. Take until 3 days after stent removed.   ??? trimethoprim/sulfamethoxazole (BACTRIM DS) 160/800 mg tablet Take one tablet by mouth twice daily.     Vitals:    11/13/18 1157   BP: 120/83   BP Source: Arm, Left Upper   Patient Position: Sitting   Pulse: 82   Resp: 16   Temp: 36.4 ???C (97.6 ???F)   TempSrc: Oral   SpO2: 97%   Weight: 38.2 kg (84 lb 3.2 oz)   Height: 157.5 cm (62)   PainSc: Zero     Body mass index is 15.4 kg/m???.     Pain Score: Zero         Pain Addressed:  N/A    Patient Evaluated for a Clinical Trial: Patient currently enrolled in a [place] treatment clinical trial.     [place] Cooperative Oncology Group performance status is 1, Restricted in physically strenuous activity but ambulatory and able to carry out work of a light or sedentary nature, e.g., light house work, office work.     Physical Exam  Constitutional:       Appearance: Normal appearance. She is not toxic-appearing or diaphoretic.   HENT:      Head: Normocephalic and atraumatic.      Mouth/Throat:      Mouth: Mucous membranes are moist.      Pharynx: Oropharynx is clear. No oropharyngeal exudate or posterior oropharyngeal erythema.   Eyes:      General: No scleral icterus.     Extraocular Movements: Extraocular movements intact.      Conjunctiva/sclera: Conjunctivae normal.      Pupils: Pupils are equal, round, and reactive to light.   Neck:      Musculoskeletal: Normal range of motion and neck supple.   Cardiovascular:      Rate and Rhythm: Normal rate and regular rhythm.      Heart sounds: No murmur.   Pulmonary:      Effort: Pulmonary effort is normal. No respiratory distress.      Breath sounds: No wheezing.  Abdominal:      General: Bowel sounds are normal. There is distension.      Tenderness: There is no abdominal tenderness. There is no guarding.   Musculoskeletal: Normal range of motion. General: No swelling or tenderness.      Right lower leg: No edema.      Left lower leg: No edema.   Skin:     General: Skin is warm and dry.      Coloration: Skin is not jaundiced.      Findings: No bruising.   Neurological:      Mental Status: She is alert and oriented to person, place, and time.   Psychiatric:         Mood and Affect: Mood normal.         Behavior: Behavior normal.         Thought Content: Thought content normal.         Judgment: Judgment normal.            CBC w/DIFF  CBC with Diff Latest Ref Rng & Units 11/13/2018 07/17/2018 07/10/2018 06/10/2018 05/13/2018   WBC 4.5 - 11.0 K/UL 12.2(H) 4.9 3.6(L) 5.1 5.2   RBC 4.0 - 5.0 M/UL 3.94(L) 3.15(L) 3.04(L) 3.80(L) 3.43(L)   HGB 12.0 - 15.0 GM/DL 11.3(L) 9.3(L) 8.8(L) 10.2(L) 8.6(L)   HCT 36 - 45 % 36.4 28.0(L) 27.1(L) 31.4(L) 26.4(L)   MCV 80 - 100 FL 92.4 88.9 89.4 82.7 77.1(L)   MCH 26 - 34 PG 28.7 29.6 29.1 26.8 25.0(L)   MCHC 32.0 - 36.0 G/DL 31.1(L) 33.3 32.6 32.4 32.4   RDW 11 - 15 % 16.8(H) 20.5(H) 20.8(H) 19.4(H) 16.9(H)   PLT 150 - 400 K/UL 648(H) 347 420(H) 463(H) 559(H)   MPV 7 - 11 FL 7.8 5.5(L) 5.6(L) 6.1(L) 6.3(L)   NEUT 41 - 77 % 76 72 72 86(H) 82(H)   ANC 1.8 - 7.0 K/UL 9.50(H) 3.50 2.60 4.40 4.30   LYMA 24 - 44 % 13(L) 16(L) 15(L) 6(L) 10(L)   ALYM 1.0 - 4.8 K/UL 1.50 0.80(L) 0.60(L) 0.30(L) 0.50(L)   MONA 4 - 12 % 9 11 11 6 4    AMONO 0 - 0.80 K/UL 1.00(H) 0.50 0.40 0.30 0.20   EOSA 0 - 5 % 1 1 1 1 2    AEOS 0 - 0.45 K/UL 0.10 0.00 0.10 0.00 0.10   BASA 0 - 2 % 1 0 1 1 2    ABAS 0 - 0.20 K/UL 0.10 0.00 0.00 0.10 0.10     Comprehensive Metabolic Profile  CMP Latest Ref Rng & Units 11/13/2018 07/17/2018 07/10/2018 06/10/2018 05/13/2018   NA 137 - 147 MMOL/L 131(L) 134(L) 138 131(L) 130(L)   K 3.5 - 5.1 MMOL/L 4.8 4.1 4.3 4.6 3.9   CL 98 - 110 MMOL/L 98 102 101 100 94(L)   CO2 21 - 30 MMOL/L 26 22 23  20(L) 24   GAP 3 - 12 7 10  14(H) 11 12   BUN 7 - 25 MG/DL 16(X) 09(U) 24 04(V) 40(J) CR 0.4 - 1.00 MG/DL 8.11(B) 1.47(W) 2.95(A) 1.20(H) 1.87(H)   GLUX 70 - 100 MG/DL 213(Y) 88 95 865(H) 846(N)   CA 8.5 - 10.6 MG/DL 9.2 8.8 9.2 9.3 9.6   TP 6.0 - 8.0 G/DL 6.2(X) 7.3 7.6 7.5 5.2(W)   ALB 3.5 - 5.0 G/DL 4.1(L) 3.1(L) 3.2(L) 3.5 3.9   ALKP 25 - 110 U/L 485(H) 161(H) 230(H) 364(H) 203(H)   ALT 7 - 56 U/L 27 6(L) 11  22 15   TBILI 0.3 - 1.2 MG/DL 0.3 0.4 0.4 0.4 0.3   GFR >60 mL/min 37(L) 45(L) 43(L) 45(L) 27(L)   GFRAA >60 mL/min 44(L) 54(L) 51(L) 55(L) 33(L)       Tumor Markers  Lab Results   Component Value Date    CEA 3,307.8 (H) 07/17/2018    CEA 4,030.3 (H) 07/10/2018    CEA 2,575.3 (H) 06/10/2018    CEA 2,333.9 (H) 05/13/2018    CEA 2,372.7 (H) 04/15/2018    CEA 1,730.6 (H) 03/18/2018    CEA 1,665.1 (H) 02/18/2018    CEA 1,265.9 (H) 01/21/2018    CEA 870.5 (H) 11/21/2017    CEA 791.6 (H) 11/05/2017    CA199 <1 06/25/2017    CA199 <1 06/11/2017    CA199 <1 05/28/2017    CA199 <1 05/14/2017    CA125 41 (H) 01/21/2018    CA125 42 (H) 11/21/2017    CA125 46 (H) 11/05/2017    CA125 41 (H) 10/22/2017    CA125 34 10/08/2017    CA125 34 09/24/2017    CA125 31 09/10/2017    CA125 29 08/27/2017    CA125 34 08/15/2017    CA125 39 (H) 07/30/2017        Assessment and Plan:  Metastatic appendiceal carcinoma. 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   - 12/24/2017 - 05/21/2018, She was on epicadostat/sirolimus trial. She stopped study drug on 05/21/18 as she decided to enroll in Mitomycin HIPEC trial at [facility].  On 05/28/2018 patient underwent diagnostic laparoscopy, evacuation of mucinous ascites 1.5 L, peritoneal biopsy, laparoscopic HIPEC with mitomycin-C in [place] by Dr. Daryl Eastern.  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  Completed second laparoscopic HIPEC on June 25, 2018. Again 1.5L removed.  -On 09/12/18 She underwent debulking at [facility] in [place], [place]. Removed 98% by Dr. [name]. Lost almost ~50 lbs. She was hospitalized for 18 days. Discharged on 09/30/18. She had L. pleural effusion requiring chest tube drainage. She has intra-abd-abscess in Aug, she has drain in place, discharged on 11/04/18. Still on Abx (Cefipeme and Diflucan) will complete 11/19/18 via Dr. Nathanial Millman. She has lost more weight which she attributes to post -op fluid loss. Appetite is good but feels full. Eats multiple times/day.  Her energy is low. No fever. No pain. Her ureteral stents were removed in Aug 2020. Also her ostomy was relocated  Obtained above records from [place] and reviewed.  Discussed hydration today and nutrition  F/u tumor markers, CEA anticipate this would trend down.  Continue surveillance   RTC in 76m with scans, cea, markers    Elevated creatinine, S/P ureteral stent. Her ureteral stents were removed in Aug 2020. Cr. 1.44. Encouraged hydration.    Fatigue: this is secondary to underlying malignancy as well as treatment. This is grade 1.    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 We will continue to monitor this.     Anemia. From cancer.    - Hb improved. Continue to monitor.    The patient/family was/were allowed to ask questions and voice concerns; these were addressed to the best of our ability. Patient/family expressed understanding of what was explained and agreed with the present plan. Patient/family has the phone numbers for the Cancer Center and was instructed on how to contact us with any questions or concerns.     Vanita Ingles MD

## 2018-11-14 ENCOUNTER — Encounter: Admit: 2018-11-14 | Discharge: 2018-11-14

## 2018-11-14 DIAGNOSIS — I454 Nonspecific intraventricular block: Secondary | ICD-10-CM

## 2018-11-14 DIAGNOSIS — C801 Malignant (primary) neoplasm, unspecified: Secondary | ICD-10-CM

## 2018-11-14 DIAGNOSIS — C181 Malignant neoplasm of appendix: Secondary | ICD-10-CM

## 2018-11-16 ENCOUNTER — Encounter: Admit: 2018-11-16 | Discharge: 2018-11-16

## 2018-11-16 DIAGNOSIS — C181 Malignant neoplasm of appendix: Secondary | ICD-10-CM

## 2018-11-16 DIAGNOSIS — C801 Malignant (primary) neoplasm, unspecified: Secondary | ICD-10-CM

## 2018-11-16 DIAGNOSIS — I454 Nonspecific intraventricular block: Secondary | ICD-10-CM

## 2018-11-20 ENCOUNTER — Encounter: Admit: 2018-11-20 | Discharge: 2018-11-20

## 2018-11-20 DIAGNOSIS — C786 Secondary malignant neoplasm of retroperitoneum and peritoneum: Secondary | ICD-10-CM

## 2018-11-21 ENCOUNTER — Encounter: Admit: 2018-11-21 | Discharge: 2018-11-21

## 2018-11-21 NOTE — Progress Notes
Interventional Radiology Outpatient Procedure Review Assessment        Reason for consult: Evaluate for left pelvic drain evaluation in setting of abdominal/pelvic fluid collection.      History of present illness:  Vicki Buchanan is a 63 y.o. female patient with a history significant for peritoneal involvement by low grade appendiceal mucinous neoplasm vs involvement by a metastatic, mucinous adenocarcinoma of unknown primary origin. She was taken to the OR on 04/03/2017 for potential cytoreductive surgery and hyperthermic intraperitoneal chemotherapy (CRS and HIPEC). On   09/12/2018, in [place], she underwent an exp Lap, cytoreductive debulking surgery for metastatic mucinous adenocarcinoma of the appendix, total colectomy, splenectomy, omentectomy, TAH/BSO and HIPEC with Mitomycin-C. Drain was placed in a pelvic fluid collection while she was in [place]. On recent CT, there is persistent gas/fluid collection at the drain site.      Assessment:   - Review of imaging:    CT CAP:    1. Interval omentectomy, tumor debulking, splenectomy, subtotal colectomy, hysterectomy and bilateral salpingo-oophorectomy.    2.  Diffuse hepatic capsular implants likely are not significantly changed. Continued thickening and ascites is seen involving the peritoneal reflections consistent with peritoneal carcinomatosis.    3.  Development of a peripherally enhancing thick-walled trans-spatial gas and fluid collection centered in the anterior pelvis, with percutaneous catheter in place. This may represent gas within loculated ascites or a postoperative fluid collection, or abscess. This communicates with a more lateral fluid collection along the lateral left hemipelvis. .   - Review of recent labs and allergies: done.   - Review of anticoagulation medications: none  - Previous ASA score if available: 3 (If ASA IV, anesthesia will run sedation for procedure if sedation indicated). - Previous IR Anesthetic/Sedation History (if applicable): none  - Case discussed with referring Physician (Y/N): yes  - Contraindications to procedure: none      Plan:  - This case has been reviewed and approved for drain check of indwelling pelvic drain with persistent gas/fluid collection.  - Patient position and modality: fluoro supine  - Intra-procedural Sedation/Medication Plan: Fentanyl and Midazolam  - Procedure time frame:  10-14 days  - Anticoagulation: Per IR peri procedural anticoagulation management protocol  - Labs: Per IR pre procedural lab protocol  - Additional requests after review: none    Lab/Radiology/Other Diagnostic Tests:  Labs:  Pertinent labs reviewed             Vangie Bicker, MD

## 2018-11-23 ENCOUNTER — Encounter: Admit: 2018-11-24 | Discharge: 2018-11-24

## 2018-11-23 ENCOUNTER — Encounter: Admit: 2018-11-23 | Discharge: 2018-11-23

## 2018-11-23 ENCOUNTER — Emergency Department: Admit: 2018-11-23 | Discharge: 2018-11-23

## 2018-11-23 ENCOUNTER — Ambulatory Visit: Admit: 2018-11-24 | Discharge: 2018-11-26 | Disposition: A | Discharge status: Home / Self Care

## 2018-11-23 DIAGNOSIS — K56609 Unspecified intestinal obstruction, unspecified as to partial versus complete obstruction: Secondary | ICD-10-CM

## 2018-11-23 DIAGNOSIS — K9419 Other complications of enterostomy: Secondary | ICD-10-CM

## 2018-11-23 MED ORDER — IOHEXOL 350 MG IODINE/ML IV SOLN
65 mL | Freq: Once | INTRAVENOUS | 0 refills | Status: CP
Start: 2018-11-23 — End: ?
  Administered 2018-11-24: 04:00:00 65 mL via INTRAVENOUS

## 2018-11-23 MED ORDER — SODIUM CHLORIDE 0.9 % IJ SOLN
50 mL | Freq: Once | INTRAVENOUS | 0 refills | Status: CP
Start: 2018-11-23 — End: ?
  Administered 2018-11-24: 04:00:00 50 mL via INTRAVENOUS

## 2018-11-23 MED ORDER — LACTATED RINGERS IV SOLP
1000 mL | INTRAVENOUS | 0 refills | Status: CP
Start: 2018-11-23 — End: ?
  Administered 2018-11-24: 04:00:00 1000 mL via INTRAVENOUS

## 2018-11-23 NOTE — Telephone Encounter
Vicki Buchanan called to report that she has swelling around her stoma with no output in the bag today. She had abdominal pain associated with that. She did "everything that she is supposed to do" but the symptoms did not improve. So, she called the CRS's office in [place], [place] and was advised to come to the ER. She called [place] for further guidance.    As I returned her call, she told that she is en-route to [facility] emergency room.     Hameem   Fellow, hem/Onc

## 2018-11-24 LAB — CBC AND DIFF
Lab: 0.1 10*3/uL (ref 0–0.20)
Lab: 0.1 10*3/uL (ref 0–0.45)
Lab: 12 10*3/uL — ABNORMAL HIGH (ref 4.5–11.0)
Lab: 21 — ABNORMAL HIGH (ref <20.7)

## 2018-11-24 LAB — COMPREHENSIVE METABOLIC PANEL
Lab: 0.4 mg/dL (ref 0.3–1.2)
Lab: 13 K/UL — ABNORMAL HIGH (ref 3–12)
Lab: 132 MMOL/L — ABNORMAL LOW (ref 137–147)
Lab: 177 U/L — ABNORMAL HIGH (ref 7–56)
Lab: 3.1 g/dL — ABNORMAL LOW (ref 3.5–5.0)
Lab: 38 mL/min — ABNORMAL LOW (ref >60)
Lab: 4.9 MMOL/L — ABNORMAL LOW (ref 3.5–5.1)
Lab: 46 mL/min — ABNORMAL LOW (ref >60)
Lab: 560 U/L — ABNORMAL HIGH (ref 25–110)

## 2018-11-24 LAB — COVID-19 (SARS-COV-2) PCR

## 2018-11-24 LAB — MAGNESIUM: Lab: 1.1 mg/dL — ABNORMAL LOW (ref 1.6–2.6)

## 2018-11-24 LAB — LIPASE: Lab: 36 U/L (ref 11–82)

## 2018-11-24 LAB — POC CREATININE, RAD: Lab: 1.6 mg/dL — ABNORMAL HIGH (ref 0.4–1.00)

## 2018-11-24 LAB — POC LACTATE: Lab: 1.4 MMOL/L (ref 0.5–2.0)

## 2018-11-24 LAB — PHOSPHORUS: Lab: 4 mg/dL (ref 2.0–4.5)

## 2018-11-24 MED ORDER — FENTANYL CITRATE (PF) 50 MCG/ML IJ SOLN
25 ug | Freq: Once | INTRAVENOUS | 0 refills | Status: CP
Start: 2018-11-24 — End: ?
  Administered 2018-11-24: 06:00:00 25 ug via INTRAVENOUS

## 2018-11-24 MED ORDER — SODIUM CHLORIDE 0.9 % IV SOLP
1000 mL | INTRAVENOUS | 0 refills | Status: CP
Start: 2018-11-24 — End: ?
  Administered 2018-11-24: 10:00:00 1000 mL via INTRAVENOUS

## 2018-11-24 MED ORDER — CEFEPIME 1G/100ML NS IVPB (MB+)
1 g | Freq: Two times a day (BID) | INTRAVENOUS | 0 refills | Status: DC
Start: 2018-11-24 — End: 2018-11-26
  Administered 2018-11-24 – 2018-11-26 (×10): 1 g via INTRAVENOUS

## 2018-11-24 MED ORDER — ALBUTEROL SULFATE 90 MCG/ACTUATION IN HFAA
2 | RESPIRATORY_TRACT | 0 refills | Status: DC | PRN
Start: 2018-11-24 — End: 2018-11-26

## 2018-11-24 MED ORDER — FAMOTIDINE (PF) 20 MG/2 ML IV SOLN
20 mg | Freq: Two times a day (BID) | INTRAVENOUS | 0 refills | Status: DC
Start: 2018-11-24 — End: 2018-11-26
  Administered 2018-11-24 – 2018-11-26 (×5): 20 mg via INTRAVENOUS

## 2018-11-24 MED ORDER — ONDANSETRON HCL (PF) 4 MG/2 ML IJ SOLN
4-8 mg | INTRAVENOUS | 0 refills | Status: DC | PRN
Start: 2018-11-24 — End: 2018-11-26

## 2018-11-24 MED ORDER — MAGNESIUM SULFATE IN WATER 4 GRAM/50 ML (8 %) IV PGBK
4 g | Freq: Once | INTRAVENOUS | 0 refills | Status: CP
Start: 2018-11-24 — End: ?
  Administered 2018-11-24: 13:00:00 4 g via INTRAVENOUS

## 2018-11-24 MED ORDER — ENOXAPARIN 40 MG/0.4 ML SC SYRG
40 mg | Freq: Every day | SUBCUTANEOUS | 0 refills | Status: DC
Start: 2018-11-24 — End: 2018-11-24

## 2018-11-24 MED ORDER — ENOXAPARIN 30 MG/0.3 ML SC SYRG
30 mg | Freq: Every day | SUBCUTANEOUS | 0 refills | Status: DC
Start: 2018-11-24 — End: 2018-11-26
  Administered 2018-11-25 – 2018-11-26 (×2): 30 mg via SUBCUTANEOUS

## 2018-11-24 MED ORDER — CEFEPIME 1 GRAM IJ SOLR
1 g | Freq: Two times a day (BID) | INTRAVENOUS | 0 refills | Status: DC
Start: 2018-11-24 — End: 2018-11-24

## 2018-11-24 MED ORDER — LACTATED RINGERS IV SOLP
INTRAVENOUS | 0 refills | Status: AC
Start: 2018-11-24 — End: ?
  Administered 2018-11-24 – 2018-11-25 (×3): 1000.000 mL via INTRAVENOUS

## 2018-11-24 MED ORDER — FENTANYL CITRATE (PF) 50 MCG/ML IJ SOLN
25-50 ug | INTRAVENOUS | 0 refills | Status: DC | PRN
Start: 2018-11-24 — End: 2018-11-26

## 2018-11-24 NOTE — ED Notes
Patient sleeping but easily arousable to voice.  Patient states that the fentanyl given at 0047 did take the "edge off" the pain and she rates it a 3/10 at this time.  Husband at bedside.  Awaiting disposition.

## 2018-11-24 NOTE — Progress Notes
VAT consulted to evaluate a malpositioned PICC line placed by an outside facility.  Per the radiology report, "Indwelling right PICC with tip overlying the region of the subclavian vein".  Upon review of chest xray it was noted that the patient currently has a chest port.  Per O2 charting, the patient also has a peripheral IV in place.  After reviewing the patient's MAR, it does not appear as though the patient has any needs for a PICC line at this time.  Discussed patient's plan of care with patient's primary nurse, Kayla, with VAT recommendations to remove PICC and support patient's need with PIVs and/or port access to decrease the patient's risk of infection and blood clot while having a PICC line in placed.  Per Lonn [place], she will touch base with the patient's primary team and reconsult VAT if needed.

## 2018-11-24 NOTE — Progress Notes
Primary team notified of IV therapy team recommendations regarding PICC line (see IVT RN note). Per MD, OK to remove PICC line at this time and use PIV for access.

## 2018-11-24 NOTE — Consults
Addendum:    -NGT in place with green output this AM.  -Ileostomy digitized at bedside with some solid stool palpated. Tract otherwise open.  -Fleets enema through ileostomy ordered.  -Will continue to follow.    Tana Conch, DO  PGY-1 General Surgery Resident  Pager [phone]      [place] [facility] Consult     Patient: Vicki Buchanan, [id number]  Admission Date:  11/23/2018, LOS: 0 days  Admission Diagnosis: No admission diagnoses are documented for this encounter.  Date of Service: November 24, 2018  CONSULT ACUTE CARE/INPATIENT GENERAL SURGERY PHYSICIAN  Consult performed by: Reola Mosher, DO  Consult ordered by: Lilli Light, MD         ASSESSMENT: 63 y.o. female with history of appendiceal cancer s/p multiple HIPECs/chemo/immunotherapy and recent HIPEC in June presents with abdominal pain and bowel obstruction    PLAN:  Admit to surgical oncology  NGT placement  IVFs  Restart PTA meds    Discussed plan of care with staff surgeon, Dr. Daphine Deutscher who directed plan of care  _____________________________________________________________________________    HPI: Vicki Buchanan is a 63 y.o. female with a history of appendiceal cancer s/p multiple HIPECs/chemo/immunotherapy presenting with abdominal pain since this morning following breakfast. She also states she has concern that her stoma from her ostomy is swollen and red. Pt states yesterday her ostomy was working fine and she was without any symptoms. Since onset, pain has become constant/crampy and mostly in LLQ. Pt states her last bout of systemic chemotherapy was September 2019 and her last bout of immunotherapy was in January of 2020. She currently denies andy N/V.     Medical History:   Diagnosis Date   ??? Bundle branch block    ??? Cancer (HCC)    ??? Cancer of appendix [facility])      Surgical History:   Procedure Laterality Date   ??? CESAREAN SECTION  1981   ??? HX CHOLECYSTECTOMY  1995   ??? LAPAROSCOPY  02/2017    biopsy peritoneal ??? COLONOSCOPY DIAGNOSTIC WITH SPECIMEN COLLECTION BY BRUSHING/ WASHING - FLEXIBLE N/A 03/19/2017    Performed by Everardo All, MD at [facility] ENDO   ??? COLONOSCOPY WITH BIOPSY - FLEXIBLE  03/19/2017    Performed by Everardo All, MD at [facility] ENDO   ??? EXPLORATORY LAPAROTOMY, DIVERTING LOOP ILEOSTOMY N/A 04/03/2017    Performed by Loralie Champagne, MD at [facility] OR   ??? INSERTION TUNNELED CENTRAL VENOUS CATHETER - AGE 66 YEARS AND OVER Left 04/23/2017    Performed by Loralie Champagne, MD at [facility] OR   ??? CYSTOURETHROSCOPY WITH INDWELLING URETERAL STENT INSERTION Bilateral 04/09/2018    Performed by Earl Many, MD at [facility] OR   ??? CYSTOURETHROSCOPY WITH URETERAL CATHETERIZATION WITH/ WITHOUT IRRIGATION/ INSTILLATION/ URETEROPYELOGRAPHY Bilateral 04/09/2018    Performed by Earl Many, MD at [facility] OR   ??? CYSTOURETHROSCOPY WITH INDWELLING URETERAL STENT EXCHANGE (RIGHT 6 x 26cm / LEFT 6 x 28cm) Bilateral 08/08/2018    Performed by Earl Many, MD at [facility] OR   ??? RETROGRADE UROGRAPHY WITH/ WITHOUT KUB Bilateral 08/08/2018    Performed by Earl Many, MD at [facility] OR   ??? ILEOSTOMY OR JEJUNOSTOMY Right    ??? TUNNELED VENOUS PORT PLACEMENT Left    ??? URETER STENT PLACEMENT Bilateral      Family History   Problem Relation Age of Onset   ??? Diabetes Mother    ??? Cancer Father    ??? Diabetes Brother    ???  Cancer-Lung Paternal Aunt    ??? Cancer-Breast Maternal Grandmother    ??? Diabetes Paternal Grandmother      Social History     Tobacco Use   ??? Smoking status: Never Smoker   ??? Smokeless tobacco: Never Used   Substance Use Topics   ??? Alcohol use: No     Frequency: Never     Comment: rarely     Your Current Medications:       Instructions    acetaminophen (TYLENOL) 500 mg tablet Take 1,000 mg by mouth twice daily as needed for Pain. Max of 4,000 mg of acetaminophen in 24 hours.     cetirizine (ZYRTEC) 10 mg tablet Take 10 mg by mouth every morning.    dexamethasone (DECADRON) 4 mg tablet Take with food. Take twice per day on Days 2 and 3 after chemo    famotidine (PEPCID PO) Take  by mouth twice daily.    hyoscyamine sulfate (LEVSIN/SL) 0.125 mg sublingual tablet Place one tablet under tongue every 4 hours as needed (Bladder Spasms).    lidocaine/prilocaine (EMLA) 2.5/2.5 % topical cream Apply small amount over port one hour prior to accessing.  Cover with plastic wrap to protect clothes.    LORazepam (ATIVAN) 0.5 mg tablet Take 1 tablet every 6 hours as needed for nausea/vomiting or anxiety. May also use at bedtime as needed for insomnia.    nitrofurantoin monohyd/m-cryst (MACROBID) 100 mg capsule Take one capsule by mouth every 12 hours. Take with food.    ondansetron (ZOFRAN ODT) 8 mg rapid dissolve tablet 1 tablet PO BID with meals on days 2 & 3 following chemotherapy. Then use q 8 h PRN nausea or vomiting.    oxybutynin chloride (DITROPAN) 5 mg tablet TAKE 1 TABLET BY MOUTH THREE TIMES DAILY AS NEEDED BLADDER  SPASM, UNINARY URGENCY/FREQUENCY. MAY CAUSE CONSTIPATION    oxyCODONE (ROXICODONE) 5 mg tablet Take one tablet by mouth every 6 hours as needed for Pain    tamsulosin (FLOMAX) 0.4 mg capsule Take one capsule by mouth at bedtime daily for 360 days. Do not crush, chew or open capsules. Take 30 minutes following the same meal each day. FOR STENT PAIN. Take until 3 days after stent removed.    trimethoprim/sulfamethoxazole (BACTRIM DS) 160/800 mg tablet Take one tablet by mouth twice daily.        ROS:  Review of Systems   Constitutional: Negative for chills and fever.   Cardiovascular: Negative for chest pain and palpitations.   Gastrointestinal: Positive for abdominal pain and constipation. Negative for heartburn, nausea and vomiting.   Neurological: Negative for dizziness and headaches.     BP: (110-169)/(84-95)   Temp:  [36.8 ???C (98.2 ???F)]   Pulse:  [95-107]   Respirations:  [16 PER MINUTE-35 PER MINUTE]   SpO2:  [97 %-100 %]   Physical Exam   Constitutional: She is oriented to person, place, and time.   HENT: Head: Normocephalic and atraumatic.   Eyes: EOM are normal.   Cardiovascular: Normal rate and normal heart sounds.   Pulmonary/Chest: Effort normal and breath sounds normal. No respiratory distress.   Abdominal: She exhibits no distension. There is no abdominal tenderness. There is no rebound and no guarding.   Musculoskeletal: Normal range of motion.         General: Edema present.   Neurological: She is alert and oriented to person, place, and time. GCS score is 15.   Skin: Skin is dry.     Lab/Radiology/Other Diagnostic  Tests:  BP: (110-169)/(84-95)   Temp:  [36.8 ???C (98.2 ???F)]   Pulse:  [95-107]   Respirations:  [16 PER MINUTE-35 PER MINUTE]   SpO2:  [97 %-100 %]   Lab Results   Component Value Date/Time    NA 132 (L) 11/23/2018 09:14 PM    K 4.9 11/23/2018 09:14 PM    CL 101 11/23/2018 09:14 PM    CO2 18 (L) 11/23/2018 09:14 PM    BUN 37 (H) 11/23/2018 09:14 PM    CR 1.6 (H) 11/23/2018 09:36 PM    CR 1.39 (H) 11/23/2018 09:14 PM    MG 1.9 04/06/2017 05:14 AM    PO4 2.7 07/10/2018 12:19 PM      Lab Results   Component Value Date/Time    HGB 10.7 (L) 11/23/2018 09:14 PM    HCT 32.9 (L) 11/23/2018 09:14 PM    WBC 12.1 (H) 11/23/2018 09:14 PM    PLTCT 540 (H) 11/23/2018 09:14 PM    INR 1.3 (H) 07/10/2018 12:19 PM     No results found for: GLUPOC     CT ABD/PELV W CONTRAST   Final Result   Addendum 1 of 1    Finalized by Sherolyn Buba, M.D. on 11/24/2018 12:28 AM. Dictated by    Sherolyn Buba, M.D. on 11/23/2018 11:56 PM.Addendum:      Final          Verlene Mayer, DO  Personal Pager: # [phone]

## 2018-11-24 NOTE — Progress Notes
RT Adult Assessment Note    NAME:Vicki Buchanan             MRN: [id number]             DOB:January 25, 1956          AGE: 63 y.o.  ADMISSION DATE: 11/23/2018             DAYS ADMITTED: LOS: 0 days    RT Treatment Plan:            Additional Comments:  Impressions of the patient: Patient in no distress.  Intervention(s)/outcome(s): Criteria Not Met.  Patient education that was completed: None  Recommendations to the care team: None    Vital Signs:  Pulse: 95  RR: 16 PER MINUTE  SpO2: 97 %  O2 Device:    Liter Flow:    O2%:    Breath Sounds: Clear (Implies normal)  Respiratory Effort: Non-Labored

## 2018-11-24 NOTE — Progress Notes
Patient arrived to room # [phone]) via wheelchair accompanied by transport. Patient transferred to the bed without assistance. Bedside safety checks completed. Initial patient assessment completed. Refer to flowsheet for details.    Admission skin assessment completed with: Jenny Reichmann, RN    Pressure injury present on arrival?: Yes    1. Head/Face/Neck: No  2. Trunk/Back: No  3. Upper Extremities: No  4. Lower Extremities: No  5. Pelvic/Coccyx: Yes  6. Assessed for device associated injury? Yes  7. Malnutrition Screening Tool (Nursing Nutrition Assessment) Completed? Yes    See Doc Flowsheet for additional wound details.     INTERVENTIONS:     Wound consult order  Criticaid barrier cream

## 2018-11-24 NOTE — Progress Notes
RT Adult Assessment Note    NAME:Leianne Elza Haufe             MRN: [id number]             DOB:02-03-1956          AGE: 63 y.o.  ADMISSION DATE: 11/23/2018             DAYS ADMITTED: LOS: 0 days    RT Treatment Plan:  Protocol Plan: Medications  Albuterol: MDI PRN    Protocol Plan: Procedures  PAP: Place a nursing order for "IS Q1h While Awake" for any of Lung Expansion indicators    Additional Comments:  Impressions of the patient: Patient resting comfortable on RA. No soa noted or endorsed at this time. Patient Says she has used an albuterol inhaler prn and states that it helps with breathing.     Vital Signs:  Pulse: 94  RR: 18 PER MINUTE  SpO2: 98 %  O2 Device:    Liter Flow:    O2%: 21 %  Breath Sounds: Clear (Implies normal);Decreased  Respiratory Effort: Non-Labored

## 2018-11-24 NOTE — ED Notes
Report to [facility] on [facility]. Port xray done.  Transport requested.

## 2018-11-24 NOTE — ED Notes
63 yo female presents to ED11 via [facility] from triage with CC of constipation. Pt reports no output from ileostomy since last night. When changing ileostomy bag this evening, pt noticed increased redness and edema at stoma site. Endorses intermittent abdominal cramping. Denies n/v, fever, chills, SOB, dizziness.      PMH of metastatic appendix CA, ex-lap.     Pt is A&Ox4, skin p/w/d, breathing even and non-labored on RA. Cart in low, locked position with side rails up x 2. Family at bedside. Call light and belongings within reach.

## 2018-11-25 LAB — PREALBUMIN: Lab: 20 mg/dL — ABNORMAL LOW (ref >60)

## 2018-11-25 LAB — MAGNESIUM: Lab: 2.3 mg/dL — ABNORMAL LOW (ref 1.6–2.6)

## 2018-11-25 LAB — CBC AND DIFF: Lab: 10 K/UL — ABNORMAL LOW (ref 4.5–11.0)

## 2018-11-25 LAB — BASIC METABOLIC PANEL: Lab: 137 MMOL/L — ABNORMAL LOW (ref 137–147)

## 2018-11-25 LAB — PHOSPHORUS: Lab: 5.2 mg/dL — ABNORMAL HIGH (ref >60)

## 2018-11-25 MED ORDER — LABETALOL 5 MG/ML IV SYRG
5 mg | INTRAVENOUS | 0 refills | Status: DC | PRN
Start: 2018-11-25 — End: 2018-11-26

## 2018-11-25 MED ORDER — PEDIATRIC MULTIVITAMIN PO CHEW
1 | Freq: Every day | ORAL | 0 refills | Status: DC
Start: 2018-11-25 — End: 2018-11-26
  Administered 2018-11-25 – 2018-11-26 (×2): 1 via ORAL

## 2018-11-25 NOTE — Progress Notes
Daily Progress Note      Today's Date:  11/25/2018  Name:  Vicki Buchanan                       MRN:  [id number]   Admission Date: 11/23/2018 (LOS: 1 day)                     Assessment: Vicki Buchanan is a 63 y.o. 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 Fentanyl PRN  -Proair PRN.  -NGT removed at bedside. Start CLD ADAT. Zofran PRN.  -IV Cefepime  -LR at 75  -Wound ostomy consult. Dietitian consult.  -Plan for PICC replacement tomorrow.  -Lovenox  -Ambulate, IS  -Please call with questions or concerns    Seen and discussed with Dr. Daphine Deutscher, with whom this plan was formulated  _____________________________________________________________________________    Subjective:  NAEON. Feeling much better this AM. Ostomy is working well. Denies N/V. Endorses appetite. Feel ileostomy is less swollen/red    Objective:  BP: (122-146)/(73-84)   Temp:  [36.3 ???C (97.4 ???F)-36.9 ???C (98.5 ???F)]   Pulse:  [91-102]   Respirations:  [16 PER MINUTE-18 PER MINUTE]   SpO2:  [97 %-99 %]     Lab Results   Component Value Date/Time    NA 137 11/25/2018 03:50 AM    K 4.6 11/25/2018 03:50 AM    CL 105 11/25/2018 03:50 AM    CO2 17 (L) 11/25/2018 03:50 AM    BUN 37 (H) 11/25/2018 03:50 AM    CR 1.36 (H) 11/25/2018 03:50 AM    MG 2.3 11/25/2018 03:50 AM    PO4 5.2 (H) 11/25/2018 03:50 AM      Lab Results   Component Value Date/Time    HGB 10.2 (L) 11/25/2018 03:50 AM    HCT 30.9 (L) 11/25/2018 03:50 AM    WBC 10.2 11/25/2018 03:50 AM    PLTCT 460 (H) 11/25/2018 03:50 AM    INR 1.3 (H) 07/10/2018 12:19 PM     No results found for: GLUPOC   Physical Exam     Gen: alert, awake, NAD  HEENT: ATNC, NG to LIS with green output in cannister Resp: normal effort, no accessory muscle use, on RA  Abd: soft, ND/NT. Ileostomy with liquid green output. JP with serous output.  MSK: moves all extremities, no edema  Neuro: no focal deficits  Skin: intact, warm, dry  Psych: cooperative, appropriate mood    Output by Drain (mL) 11/23/18 0701 - 11/23/18 1900 11/23/18 1901 - 11/24/18 0700 11/24/18 0701 - 11/24/18 1900 11/24/18 1901 - 11/25/18 0613   Nasogastric Tube 11/24/18 0208 Nose 14 FR  250 450 300   [employer] Drain 10/25/18 1200   10 0   Ileostomy 04/03/17 0951 Lower Right Quadrant   750 0    ___________________________    Reola Mosher, DO   Service Pager: # [phone]

## 2018-11-25 NOTE — Consults
CLINICAL NUTRITION                                                        Clinical Nutrition Initial Assessment    Name: Vicki Buchanan        MRN: [id number]          DOB: 1956-02-27          Age: 63 y.o.  Admission Date: 11/23/2018             LOS: 1 day        Recommendation:  ADAT to Regular, if Pt has high ileostomy output may need to switch to Low Fiber diet.     Comments:  Vicki Buchanan is a 63 y.o. 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 Nutrition consulted for assessment and diet education. Pt noted to have severe wt loss and stage 2 pressure injury as well. Pt reports that prior to OR on 6/25 she weighed 147 lbs, yet after the surgery she weighed 99 lbs due to the large amount of tumor that was removed, in addition to SBR, TAH/BSO and spleenectomy. Pt reports she also had ascites post-op and when that was drained she was down to 85 lbs. Pt required TPN x 2 weeks while in OSH. She was discharged on 8/23 and returned to [facility]. Pt reports she now feels much better and has been trying to eat 1800 kcals per day and 90 gm protein, but she is struggling to meet this. She is unsure of her current weight, she states she has been afraid to weight herself and see that she is not gaining weight back. RD has ordered weights. Pt reports she drink Scandi shakes at home at breakfast and does like to drink [employer]on, she had one this am with her CLD tray. However, Pt also reports that she sometimes has very watery ileostomy output and has a hard time staying hydrated and wonders if she not absorbing adequately. 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 RD reviewed ileostomy diet handout from [facility], reviewed food that can increase output and avoiding these. Also reviewed using oral rehydration solutions when output is high. Provided Pt with handouts along with tips for increasing kcals and protein and list of protein supplements. Pt seemed to understand the information well and was grateful for the info.     Nutrition Assessment of Patient:  Admit Weight: 38.6 kg(reported); Usual Weight: 66.7 kg(prior to surgery 6/25);    BMI (Calculated): 15.55; BMI Categories Adult: Under Weight: < 18.5; Appearance: Thin, Cachectic    Pertinent Allergies/Intolerances: mild lactose intolerance, shrimp  Pertinent Labs: CO2 17, BUN 37, Cre 1.36, BG 55, Pre Alb 20; Pertinent Meds: LR@ 75 ml/hr, zofran;    Oral Diet Order: Clear liquid; Oral Supplement: Boost breeze;PRN     Current Oral Intake: Inadequate;Improving              Estimated Calorie Needs: 4540-9811(91-47 kcals/kg per 38.6 kg)  Estimated Protein Needs: 60-77(1.5-2 gm/kg per 38.6 kg)    Malnutrition Assessment:   Malnutrition present; ICD-10 code E43: Chronic illness/Severe malnutrition;  ;  ;  ; Energy intake: 75% or less of estimated energy  requirement for 1 month or more, Severe loss of muscle mass, Severe loss of body fat  Malnutrition Interventions: Provided diet education on post-ileostomy diet and ways to increase Kcals/protein    Nutrition Focused Physical Assessment:   Loss of Subcutaneous Fat: Yes; Severity: Severe; Location: Orbital, Triceps  Muscle Wasting: Yes; Severity: Severe; Location: Calf, Clavicle, Deltoid, Intercostal, Interosseous, Quadriceps, Scapula, Temple, Trapezius  Edema: No;  ;       Pressure Injury: stage 2 sacrum          Nutrition Diagnosis:  Inadequate protein-energy intake  Etiology: related to SBO, high output ilesotomy, recent CRS/HIPEC   Signs & Symptoms: Pt reports, Pt on CLD, Pt with severe wasting                      Intervention / Plan:  Ordered chewable MVI, and daily weights  Will monitor for PO advancement and tolerance  Will monitor GI output, labs, and weights    Goals: Avoid prolonged clear liquid status  Time Frame: Within 24 hours  Prevent further weight loss  Time Frame: Throughout stay  Prevent further skin breakdown  Time Frame: Throughout stay       Concepcion Living MS, RD, LD, CNSC  Available on [facility]  Office [phone]

## 2018-11-26 ENCOUNTER — Encounter: Admit: 2018-11-24 | Discharge: 2018-11-24

## 2018-11-26 DIAGNOSIS — C786 Secondary malignant neoplasm of retroperitoneum and peritoneum: Secondary | ICD-10-CM

## 2018-11-26 DIAGNOSIS — Z1159 Encounter for screening for other viral diseases: Secondary | ICD-10-CM

## 2018-11-26 DIAGNOSIS — E43 Unspecified severe protein-calorie malnutrition: Secondary | ICD-10-CM

## 2018-11-26 DIAGNOSIS — Z681 Body mass index (BMI) 19 or less, adult: Secondary | ICD-10-CM

## 2018-11-26 DIAGNOSIS — Z9221 Personal history of antineoplastic chemotherapy: Secondary | ICD-10-CM

## 2018-11-26 DIAGNOSIS — Z9049 Acquired absence of other specified parts of digestive tract: Secondary | ICD-10-CM

## 2018-11-26 DIAGNOSIS — C181 Malignant neoplasm of appendix: Secondary | ICD-10-CM

## 2018-11-26 DIAGNOSIS — K56609 Unspecified intestinal obstruction, unspecified as to partial versus complete obstruction: Secondary | ICD-10-CM

## 2018-11-26 DIAGNOSIS — Z79899 Other long term (current) drug therapy: Secondary | ICD-10-CM

## 2018-11-26 DIAGNOSIS — K9419 Other complications of enterostomy: Principal | ICD-10-CM

## 2018-11-26 LAB — PHOSPHORUS: Lab: 3.3 mg/dL — ABNORMAL LOW (ref 2.0–4.5)

## 2018-11-26 LAB — CBC AND DIFF: Lab: 9.5 K/UL (ref 4.5–11.0)

## 2018-11-26 LAB — MAGNESIUM: Lab: 1.8 mg/dL — ABNORMAL HIGH (ref 1.6–2.6)

## 2018-11-26 LAB — BASIC METABOLIC PANEL: Lab: 136 MMOL/L — ABNORMAL LOW (ref 137–147)

## 2018-11-26 MED ORDER — ACETAMINOPHEN 325 MG PO TAB
650 mg | ORAL | 0 refills | Status: DC | PRN
Start: 2018-11-26 — End: 2018-11-26

## 2018-11-26 NOTE — Progress Notes
IVT to bedside and patient and husband with many question as to need for PICC placement.  Concerns for left arm placement as patient stated they had "a lot of trouble" and the outside placed line was malpositioned at tip being subclavian.  Also noted is the left port is placed subclavian making the right side only evaluable.  As this line will only be used for antibiotics, home health at this time after much discussion and teaching it was decided to use the port only.  Bedside RN was informed.

## 2018-11-26 NOTE — Progress Notes
PHYSICAL THERAPY  NOTE      Name: Vicki Buchanan        MRN: [id number]          DOB: 1955-04-11          Age: 63 y.o.  Admission Date: 11/23/2018             LOS: 2 days      Patient denies changes from baseline mobility and reports no history of balance loss or falls in the last three months.  Patient endorses no concerns with mobility at home.  Currently, the patient is ambulating in room and on unit without difficulty, patient reports she was able to ambulate with her husband around the entire unit (625 ft) yesterday.  Patient has good support at home, her husband is able to provide support when needed with stairs, transfers, and long distance ambulation.  Patient reports that she is able to get in and out of bed by herself and ambulate short distances.  Patient owns a wheelchair for long distances due to fatigue. Patient reports she has home health set up to start when she is discharged, but has no concerns with returning home home at this point in time.         Encouraged patient to continue mobilization while in the hospital and discussed the mobility plan with the bedside nursing staff.  Physical therapy services will be discontinued at this time, please re-consult if the patient has a change in mobility status.      Therapist: Coralyn Helling, SPT  Date: 11/26/2018

## 2018-11-26 NOTE — Care Plan
Problem: Infection, Risk of, Central Venous Catheter-Associated Bloodstream Infection  Goal: Absence of CVC Associated Bloodstream infection  11/26/2018 1510 by Hamilton Capri, RN  Outcome: Goal Achieved  11/26/2018 1509 by Hamilton Capri, RN  Flowsheets (Taken 11/26/2018 1509)  Absence of CVC associated bloodstream  infection:   Assess for central line catheter infection (Monitor SIRS criteria)   Manage central venous catheter   Follow central line bundle components     Problem: Skin Integrity  Goal: Skin integrity intact  11/26/2018 1510 by Hamilton Capri, RN  Outcome: Goal Achieved  11/26/2018 1509 by Hamilton Capri, RN  Flowsheets (Taken 11/26/2018 1509)  Skin integrity intact:   Assess nutrition   Promote nutrition   Assure position change   Monitor skin integrity   Reduce skin shear, friction and tissue load  Goal: Healing of skin (Wound & Incision)  11/26/2018 1510 by Hamilton Capri, RN  Outcome: Goal Achieved  11/26/2018 1509 by Hamilton Capri, RN  Flowsheets (Taken 11/26/2018 1509)  Healing of wound (wounds and Incisions):   Assess for signs and symptoms of wound infection   Assess wound site healing  Goal: Healing of skin (Pressure Injury)  Outcome: Goal Achieved     Problem: Nutrition Deficit  Goal: Adequate nutritional intake  11/26/2018 1510 by Hamilton Capri, RN  Outcome: Goal Achieved  11/26/2018 1509 by Hamilton Capri, RN  Flowsheets (Taken 11/26/2018 1509)  Adequate nutritional intake:   Assess dietary preferences   Promote oral fluid intake

## 2018-11-26 NOTE — Discharge Planning (AHS/AVS)
Your home health agency is [facility] fax [phone] in [place]. Your home health nurse will call you tomorrow to arrange your first home health visit. Please be aware that the nurse is typically coming from another patient's home and will provide a time frame for visit, not a specific time. If you do not hear from your nurse within 48 hours of discharge, please call your home health agency directly at number listed above.     Your home infusion agency is ***. They will provide you with any needed equipment or supplies, as well as your home infusion medications. Please contact them directly if you have any questions or concerns about your ***.    You are being discharged with an ostomy. The wound/ostomy nurse gave you a folder of information and instructions. If you misplaced this their contact is [phone]. A website to trouble shoot skin irritation or other concerns: [email or web address]  [employer] program to order supplies [phone]), as well as visit the [facility] at [address] in [place] [phone] to obtain supplies today.   RN reports that [facility] hours are M-F 9-5 and Sat 9-noon.    Please schedule follow up appointment with Andre Lefort A with in a month of discharge, please call [phone].    Thank you, and take care!  Alex Gardener

## 2018-11-26 NOTE — Care Plan
Problem: Infection, Risk of, Central Venous Catheter-Associated Bloodstream Infection  Goal: Absence of CVC Associated Bloodstream infection  Flowsheets (Taken 11/26/2018 1509)  Absence of CVC associated bloodstream  infection:   Assess for central line catheter infection (Monitor SIRS criteria)   Manage central venous catheter   Follow central line bundle components     Problem: Infection, Risk of  Goal: Absence of infection  Flowsheets (Taken 11/26/2018 1509)  Absence of infection:   Assess for infection (Monitor SIRS Criteria)   Monitor for signs and symptoms of infection   Administer pharmacological therapies as ordered  Goal: Knowledge of Infection Control Procedures  Flowsheets (Taken 11/26/2018 1509)  Knowledge of Infection Control procedures: Provide Isolation Precautions Education     Problem: Falls, High Risk of  Goal: Absence of falls-Adult Patient  Flowsheets (Taken 11/26/2018 1509)  Absence of falls-Adult Patient:   Complete Fall Risk Assessment.   Provide safe ambulation.   Implement fall risk bundle.   Provde safe environment.  Goal: Absence of Falls-Pediatric patient  Flowsheets (Taken 11/26/2018 1509)  Absence of Falls - pediatric patient:   Complete Fall Risk Assessment (Pediatric).   Provide safe ambulation.

## 2018-11-26 NOTE — Progress Notes
OCCUPATIONAL THERAPY  NOTE       Name: Vicki Buchanan        MRN: [id number]          DOB: [birth year]          Age: 63 y.o.  Admission Date: 11/23/2018             LOS: 2 days      Patient denies changes from baseline ADLs and functional mobility and reports no history of balance loss or falls in the last three months.  Patient has not had a procedure or surgery that would make getting dressed difficult, including putting on socks and shoes.  Patient has not demonstrated or reported new difficulties with vision when completing functional tasks.  Patient endorses no concerns with functional skills at home.  Currently, the patient is ambulating on unit without difficulty.    Encouraged patient to continue to perform functional skills/ADLs while in the hospital and discussed the ability for the patient to complete their ADLs with the bedside nursing staff.  Occupational therapy services will be discontinued at this time, please re-consult if the patient has a change in functional status.        Therapist: Clearence Cheek, OTR/L [phone]  Date: 11/26/2018

## 2018-11-28 NOTE — Discharge Instructions - Pharmacy
Discharge Summary      Name: Vicki Buchanan  Medical Record Number: [id number]        Account Number:  [id number]  Date Of Birth:  10-09-1955                         Age:  63 years   Admit date:  11/23/2018                     Discharge date:  11/26/18      Discharge Attending: Dr. 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     Vicki Buchanan is a 64 y.o. female with a history of appendiceal cancer s/p multiple HIPECs/chemo/immunotherapy presenting with abdominal pain since this morning following breakfast. She also states she has concern that her stoma from her ostomy is swollen and red. Pt states yesterday her ostomy was working fine and she was without any symptoms. Since onset, pain has become constant/crampy and mostly in LLQ. Pt states her last bout of systemic chemotherapy was September 2019 and her last bout of immunotherapy was in January of 2020. She currently denies andy N/V.     Patient's hospital course was uneventful. She continued to receive the cefepime that she was prescribed at an OSH and discharged with the remainder of a dose. She continued to have elevated ostomy output > 1L throughout the remainder of her stay and was educated on fluid replacement on discharge. Additionally, her JP drain that was placed at an OSH was removed. Items Needing Follow Up   Pending items or areas that need to be addressed at follow up: [facility] course of antibiotics. Call clinic with ostomy output > 1 L for > 2 consecutive days.     Pending Labs and Follow Up Radiology    Pending labs and/or radiology review at this time of discharge are listed below: if this area is blank, there are no items for review.   Pending Labs     Order Current Status    COVID-19 (SARS-COV-2) PCR Collected (11/24/18 0345)    CULTURE-BLOOD W/SENSITIVITY Preliminary result    CULTURE-BLOOD W/SENSITIVITY Preliminary result            Medications      Medication List      CONTINUE taking these medications    ??? acetaminophen 500 mg tablet; Commonly known as: TYLENOL; Dose: 1,000 mg;   Refills: 0  ??? albuterol sulfate 90 mcg/actuation aerosol inhaler; Commonly known as:   PROAIR HFA; Dose: 2 puff; Doctor's comments: Pharmacist may choose either   generic albuterol HFA, ProAir HFA, Ventolin HFA, or Proventil HFA based on   what is cheapest for the patient.; Refills: 0  ???  ceFEPIme 1 gram injection; Commonly known as: MAXIPIME; Dose: 1 g;   Refills: 0  ??? dexAMETHasone 4 mg tablet; Commonly known as: DECADRON; Take with food.   Take twice per day on Days 2 and 3 after chemo; Quantity: 30 tablet;   Refills: 3  ??? fluconazole 200 mg tablet; Commonly known as: DIFLUCAN; Dose: 1 tablet;   Refills: 0  ??? lidocaine/prilocaine 2.5/2.5 % topical cream; Commonly known as: EMLA;   Apply small amount over port one hour prior to accessing.  Cover with   plastic wrap to protect clothes.; Quantity: 5 g; Refills: 0  ??? LORazepam 0.5 mg tablet; Commonly known as: ATIVAN; Take 1 tablet every   6 hours as needed for nausea/vomiting or anxiety. May also use at bedtime   as needed for insomnia.; Quantity: 30 tablet; Refills: 0  ??? metoprolol tartrate 25 mg tablet; Commonly known as: LOPRESSOR; Dose:   12.5 mg; Refills: 0  ??? ondansetron 8 mg rapid dissolve tablet; Commonly known as: ZOFRAN ODT; 1 tablet PO BID with meals on days 2 & 3 following chemotherapy. Then use q   8 h PRN nausea or vomiting.; Quantity: 60 tablet; Refills: 3  ??? PEPCID AC 10 mg tablet; Generic drug: famotidine; Dose: 10 mg; Refills:   0       Return Appointments and Scheduled Appointments     Scheduled appointments:    Dec 16, 2018 11:00 AM CDT  US RENAL BLADDER COMPLETE with SONO ??? BH ROOM 1  [facility] (Radiology) [address]  [address]  [place] [address]  [phone]   Dec 16, 2018  1:00 PM CDT  Return Patient with Earl Many, MD  [facility] (Urology) [address]  [address] Alfonso Patten [place] [address]  [phone]   Feb 12, 2019 12:30 PM CST  Imaging Lab Draw with IMG DRAW CHAIR-[place]  [facility] Radiology) [address]  [address]  [place] [address]  [phone]   Feb 12, 2019  1:00 PM CST  CT CHEST/ABD/PELVIS (SCHED ONL with CT-[place]  [facility]) [address]  [address]  [place] [address]  [phone]   Feb 12, 2019  4:00 PM CST  (Arrive by 3:45 PM)  Return Patient with Vanita Ingles, MD  [facility] Exam) [facility]  [address] [place] [address]  [phone]   May 14, 2019 11:40 AM CST  (Arrive by 11:25 AM)  Return Patient with Dia Crawford, MD  [facility] Exam) [facility]  [address]  [place] [address]  [phone]          Consults, Procedures, Diagnostics, Micro, Pathology   Consults: None  Surgical Procedures & Dates: None  Significant Diagnostic Studies, Micro and Procedures: noted in brief hospital course  Significant Pathology: none  Nutrition: Dietitian Documentation ICD-10 code E17: Chronic illness/Severe malnutrition        Energy intake: 75% or less of estimated energy requirement for 1 month or more, Severe loss of muscle mass, Severe loss of body fat  Loss of Subcutaneous Fat: Yes Severe Orbital, Triceps  Muscle Wasting: Yes Severe Calf, Clavicle, Deltoid, Intercostal, Interosseous, Quadriceps, Scapula, Temple, Trapezius  Edema: No      Malnutrition Interventions: Provided  diet education on post-ileostomy diet and ways to increase Kcals/protein      Discharge Disposition, Condition   Patient Disposition: Home  Condition at Discharge: Stable    Code Status     Code Status History     Date Active Date Inactive Code Status Order ID          11/24/2018 0311 11/26/2018 1657 Full Code [id number]  Georga Hacking, MD Inpatient    04/03/2017 1341 04/06/2017 1349 Full Code [id number]  Maretta Los, MD Inpatient    Only showing the last 2 code statuses.          Patient Instructions        Regular Diet    You have no dietary restriction. Please continue with a healthy balanced diet.     Discharge Signs/Symptoms    Colon Rectal Please contact your doctor if you have any of the following symptoms:  *Temperature over 101 F  *Increased redness, tenderness, or swelling at the incision site  *Excessive drainage from the incision  *Drainage that smells bad  *If the edges of your incision come apart  *Severe abdominal pain with or without bloating  *Constant nausea and/or vomiting     Questions About Your Stay    Colon Rectal  For questions or concerns regarding your hospital stay:    -DURING BUSINESS HOUR (8:00 AM - 4:00 PM):  Call [phone] and ask to speak with your surgeon's office.    -AFTER BUSINESS HOURS (4:00 PM - 8:00 AM, on weekends, or holidays):  Call [phone] and ask the operator to page the on-call surgery physician.     Discharging attending physician: Dia Crawford [[id number]]      Activity as Tolerated    It is important to keep increasing your activity level after you leave the hospital.  Moving around can help prevent blood clots, lung infection (pneumonia) and other problems.  Gradually increasing the number of times you are up moving around will help you return to your normal activity level more quickly.  Continue to increase the number of times you are up to the chair and walking daily to return to your normal activity level. Begin to work towards your normal activity level at discharge.     Bathing Restrictions    You may shower tomorrow upon discharge.     Driving Restrictions    No driving while taking pain medication.     Lifting Restrictions    Do not lift more than 10 pounds until after follow-up appointment.     Strenuous Activity Restrictions    Please refrain from strenuous activity until after follow-up appointment.     Other Activity Restrictions    Please call clinic if ostomy output is greater than 1 L for two consecutive days.     Other Line Care    Port care     Ostomy Care    General Ostomy Care General Ostomy Care Home care instructions:    *Empty bag when it is 1/3 full.   *Change ostomy bag every 7 days or if it starts leaking.  *When changing ostomy bag cleanse skin with water only.  *Order ostomy supplies ahead of time so you don't run out.    For more information visit the [place] [employer] [place] website at [email or web address].     Opioid (Narcotic) Safety Information    OPIOID (NARCOTIC) PAIN MEDICATION SAFETY    We care about your comfort, and believe you need opioid medications at this  time to treat your pain.  An opioid is a strong pain medication.  It is only available by prescription for moderate to severe pain.  Usually these medications are used for only a short time to treat pain, but sometimes will be prescribed for longer.  Talk with your doctor or nurse about how long they expect you to need this medication.    When used the right way, opioids are safe and effective medications to treat your pain, even when used for a long time.  Yet, when used in the wrong way, opioids can be dangerous for you or others.  Opioids do not work for everyone.  Most patients do not get full relief of their pain from opioid medication; full relief of your pain may not be possible.     For your safety, we ask you to follow these instructions:    *Only take your opioid medication as prescribed.  If your pain is not controlled with the prescribed dose, or the medication is not lasting long enough, call your doctor.  *Do not break or crush your opioid medication unless your doctor or pharmacist says you can.  With certain medications, this can be dangerous, and may cause death.  *Never share your medications with others, even if they appear to have a good reason.  Never take someone else's pain medication-this is dangerous, and illegal (a crime).  Overdoses and deaths have occurred.  *Keep your opioid medications safe, as you would with cash, in a lock box or similar container.  *Make sure your opioids are going to be secure, especially if you are around children or teens.  *Talk with your doctor or pharmacist before you take other medications.  *Avoid driving, operating machinery, or drinking alcohol while taking opioid pain medication.  This may be unsafe.    Pain medications can cause constipation. Constipation is bowel movements that are less often than normal. Stools often become very hard and difficult to pass. This may lead to stomach pain and bloating. It may also cause pain when trying to use the bathroom. Constipation may be treated with suppositories, laxatives or stool softeners. A diet high in fiber with plenty of fluids helps to maintain regular, soft bowel movements.       Additional Orders: Case Management, Supplies, Home Health     Home Health/DME              HOME HEALTH/DME  ONCE     Comments:  Home Health/Durable Medical Equipment Order Details    Patient Name:  Vicki Buchanan                   Medical Record Number:   [id number]    Agency Instructions: Patient now stable for discharge, please resume previous home health services. Evaluate and treat.    LINE MAINTENANCE:   PORT  Line placed on: 04/23/17  # of Lumens: One  Dressing change due: 12/02/18  -Home health agency to provide weekly line care, lab draws, and assist patient and family with IV medication administration.   -Please teach patient/family/caregivers IV management and line care.  -Please monitor and maintain line per agency's protocol. Please change dressing every Monday.   -Infusion Agency to provide IV medications, line supplies, bio-patch for dressing changes, and perform instruction at bedside.  -Line can be de-accessed when antibiotics are completed.   -Please check with the patient's infectious disease physician prior to de-accessing.???     IV ANTIBIOTICS:   -Patient has  the following IV medication(s):  Drug: Cefepime   Dose: 1g  Frequency: Every 12 hours  Anticipate length of need will be till 11/30/18  Stop date to be determined by ID Physician.    LABS:   -Patient will need the following weekly labs: CBC w diff and CMP Please draw labs on the following day(s)/date: on Friday, first draw on 11/29/18.   -Please fax results to patient's PCP and Infusion agency.      Clinical findings to support homebound status: Medically contraindicated  Clinical findings to support home care services: Deficits in medical management, Requires instructions/administration of injections/IV therapy and Ostomy management deficits    I certify that this patient is under my care and that I, or a nurse practitioner or physician's assistant working with me, had a face-to-face encounter that meets the physician's face-to-face encounter requirements with this patient,  This patient is under my care, and I have initiated the establishment of the plan of care.??? This patient will be followed by a physician after discharge, who will periodically review the plan of care. [facility] Agency: [facility] [id number] fax [phone] in [place]  Infusion Agency:  [facility]  [phone]/ fax [phone]  [place] Physician: Dr. Deedra Ehrich P: [phone] F: [phone] NPI# [id number]  PCP: Nicole Cella [phone]/I [phone]   Question Answer Comment   Attending Name/Contact Dr. Deedra Ehrich P: [phone] F: [phone] NPI# [id number]    PCP Name/Contact Nicole Cella [phone]/V [phone]    Home Health to Follow PCP                         Signed:  Leighton Roach, MD  11/28/2018      cc:  Primary Care Physician:  Nicole Cella   Verified  Referring physicians:  No ref. provider found   Additional provider(s):        Did we miss something? If additional records are needed, please fax a request on office letterhead to [phone]. Please include the patient's name, date of birth, fax number and type of information needed. Additional request can be made by email at [email or web address]. For general questions of information about electronic records sharing, call [phone].

## 2018-11-29 LAB — CULTURE-BLOOD W/SENSITIVITY

## 2018-12-03 ENCOUNTER — Encounter: Admit: 2018-12-03 | Discharge: 2018-12-03 | Payer: BC Managed Care – PPO

## 2018-12-03 MED ORDER — ONDANSETRON 8 MG PO TBDI
ORAL_TABLET | Freq: Two times a day (BID) | ORAL | 3 refills | 8.00000 days | Status: DC
Start: 2018-12-03 — End: 2019-02-09

## 2018-12-16 ENCOUNTER — Encounter

## 2018-12-16 DIAGNOSIS — R198 Other specified symptoms and signs involving the digestive system and abdomen: Secondary | ICD-10-CM

## 2018-12-16 DIAGNOSIS — I454 Nonspecific intraventricular block: Secondary | ICD-10-CM

## 2018-12-16 DIAGNOSIS — C181 Malignant neoplasm of appendix: Secondary | ICD-10-CM

## 2018-12-16 DIAGNOSIS — N135 Crossing vessel and stricture of ureter without hydronephrosis: Secondary | ICD-10-CM

## 2018-12-16 DIAGNOSIS — C801 Malignant (primary) neoplasm, unspecified: Secondary | ICD-10-CM

## 2018-12-16 MED ORDER — LOPERAMIDE 1 MG/5 ML PO LIQD
2 mg | Freq: Four times a day (QID) | ORAL | 3 refills | 10.00000 days | Status: AC | PRN
Start: 2018-12-16 — End: ?

## 2019-02-09 ENCOUNTER — Encounter: Admit: 2019-02-09 | Discharge: 2019-02-09 | Payer: BC Managed Care – PPO

## 2019-02-09 ENCOUNTER — Inpatient Hospital Stay: Admit: 2019-02-09 | Discharge: 2019-02-09 | Payer: BC Managed Care – PPO

## 2019-02-09 ENCOUNTER — Inpatient Hospital Stay: Admit: 2019-02-09 | Payer: BC Managed Care – PPO

## 2019-02-09 DIAGNOSIS — R079 Chest pain, unspecified: Secondary | ICD-10-CM

## 2019-02-09 LAB — COVID-19 (SARS-COV-2) PCR

## 2019-02-09 LAB — CBC AND DIFF
Lab: 0 % — ABNORMAL LOW (ref >60)
Lab: 0 % — ABNORMAL LOW (ref >60)
Lab: 0 10*3/uL (ref 0–0.20)
Lab: 0 K/UL (ref 0–0.45)
Lab: 1.8 10*3/uL (ref 1.0–4.8)
Lab: 1.9 10*3/uL — ABNORMAL HIGH (ref 0–0.80)
Lab: 10 % — ABNORMAL HIGH (ref 4–12)
Lab: 15 % — ABNORMAL HIGH (ref 11–15)
Lab: 15 10*3/uL — ABNORMAL HIGH (ref 1.8–7.0)
Lab: 19 K/UL — ABNORMAL HIGH (ref 4.5–11.0)
Lab: 2.9 M/UL — ABNORMAL LOW (ref 4.0–5.0)
Lab: 27 % — ABNORMAL LOW (ref 36–45)
Lab: 31 pg (ref 26–34)
Lab: 33 g/dL (ref 32.0–36.0)
Lab: 536 K/UL — ABNORMAL HIGH (ref 150–400)
Lab: 7.4 FL (ref 7–11)
Lab: 81 % — ABNORMAL HIGH (ref 41–77)
Lab: 9 % — ABNORMAL LOW (ref 24–44)
Lab: 95 FL — ABNORMAL LOW (ref 80–100)

## 2019-02-09 LAB — OSMOLALITY-URINE RANDOM: Lab: 408 mosm/kg (ref 50–1400)

## 2019-02-09 LAB — TROPONIN-I
Lab: 0 ng/mL — ABNORMAL HIGH (ref 0.0–0.05)
Lab: 0 ng/mL — ABNORMAL HIGH (ref 0.0–0.05)

## 2019-02-09 LAB — D-DIMER: Lab: 201 ng{FEU}/mL — ABNORMAL HIGH (ref <500)

## 2019-02-09 LAB — MAGNESIUM: Lab: 0.8 mg/dL — CL (ref 1.6–2.6)

## 2019-02-09 LAB — COMPREHENSIVE METABOLIC PANEL
Lab: 136 MMOL/L — ABNORMAL LOW (ref 137–147)
Lab: 4.3 MMOL/L (ref 3.5–5.1)

## 2019-02-09 LAB — SODIUM-URINE RANDOM: Lab: 14 MMOL/L

## 2019-02-09 LAB — CREATININE-URINE RANDOM: Lab: 97 mg/dL

## 2019-02-09 MED ORDER — MAGNESIUM SULFATE IN D5W 1 GRAM/100 ML IV PGBK
1 g | INTRAVENOUS | 0 refills | Status: CP
Start: 2019-02-09 — End: ?
  Administered 2019-02-09: 1 g via INTRAVENOUS

## 2019-02-09 MED ORDER — NS WITH POTASSIUM CHLOR 20 MEQ, MAG SULF 1 G/L IV INFUSION
Freq: Once | INTRAVENOUS | 0 refills | Status: CP
Start: 2019-02-09 — End: ?
  Administered 2019-02-10 (×3): 1000.000 mL via INTRAVENOUS

## 2019-02-09 MED ORDER — HEPARIN, PORCINE (PF) 5,000 UNIT/0.5 ML IJ SYRG
5000 [IU] | SUBCUTANEOUS | 0 refills | Status: DC
Start: 2019-02-09 — End: 2019-02-11
  Administered 2019-02-10 – 2019-02-11 (×4): 5000 [IU] via SUBCUTANEOUS

## 2019-02-09 MED ORDER — LACTATED RINGERS IV SOLP
1000 mL | Freq: Once | INTRAVENOUS | 0 refills | Status: CP
Start: 2019-02-09 — End: ?
  Administered 2019-02-10: 01:00:00 1000 mL via INTRAVENOUS

## 2019-02-09 MED ORDER — ALBUTEROL SULFATE 90 MCG/ACTUATION IN HFAA
2 | RESPIRATORY_TRACT | 0 refills | Status: DC | PRN
Start: 2019-02-09 — End: 2019-02-12

## 2019-02-09 MED ORDER — ENOXAPARIN 30 MG/0.3 ML SC SYRG
30 mg | Freq: Every day | SUBCUTANEOUS | 0 refills | Status: DC
Start: 2019-02-09 — End: 2019-02-10

## 2019-02-09 MED ORDER — CALCIUM CARBONATE 200 MG CALCIUM (500 MG) PO CHEW
500 mg | Freq: Once | ORAL | 0 refills | Status: CP | PRN
Start: 2019-02-09 — End: ?
  Administered 2019-02-10: 01:00:00 500 mg via ORAL

## 2019-02-09 MED ORDER — FAMOTIDINE 10 MG PO TAB
10 mg | Freq: Two times a day (BID) | ORAL | 0 refills | Status: DC
Start: 2019-02-09 — End: 2019-02-12
  Administered 2019-02-09 – 2019-02-11 (×5): 10 mg via ORAL

## 2019-02-09 MED ORDER — LOPERAMIDE 1 MG/7.5 ML PO LIQD
2 mg | Freq: Three times a day (TID) | ORAL | 0 refills | Status: DC | PRN
Start: 2019-02-09 — End: 2019-02-12
  Administered 2019-02-09 – 2019-02-11 (×8): 2 mg via ORAL

## 2019-02-09 MED ORDER — ACETAMINOPHEN 500 MG PO TAB
1000 mg | Freq: Two times a day (BID) | ORAL | 0 refills | Status: DC | PRN
Start: 2019-02-09 — End: 2019-02-12
  Administered 2019-02-09: 23:00:00 1000 mg via ORAL

## 2019-02-09 MED ORDER — MAGNESIUM SULFATE IN D5W 1 GRAM/100 ML IV PGBK
1 g | INTRAVENOUS | 0 refills | Status: CP
Start: 2019-02-09 — End: ?
  Administered 2019-02-09: 20:00:00 1 g via INTRAVENOUS

## 2019-02-09 MED ORDER — ONDANSETRON 4 MG PO TBDI
8 mg | ORAL | 0 refills | Status: DC | PRN
Start: 2019-02-09 — End: 2019-02-12

## 2019-02-09 NOTE — Progress Notes
RT Adult Assessment Note    NAME:Vicki Buchanan             MRN: [id number]             DOB:02/02/1956          AGE: 63 y.o.  ADMISSION DATE: 02/09/2019             DAYS ADMITTED: LOS: 0 days    RT Treatment Plan:  Protocol Plan: Medications  Albuterol: MDI PRN         Additional Comments:  Impressions of the patient: Pt on RA 100%. clear breath sounds. Albuterol MDI PRN.         Vital Signs:  Pulse: 87  RR: 16 PER MINUTE  SpO2: 100 %  O2 Device:    Liter Flow:    O2%: 21 %  Breath Sounds:    Respiratory Effort: Non-Labored

## 2019-02-09 NOTE — H&P (View-Only)
Admission H&P - Department of Internal Medicine  Patient Name Vicki Buchanan - MRN [id number] - Date of Admission 02/09/2019  ______________________________    Chief Complaint (CC)  Chest pain    History of Present Illness (HPI)  63 yo patient with a history of appendiceal cancer diagnosed in 2018 status post chemotherapy discontinued in 2019, immunotherapy, multiple HIPECs last being in July 2020, with recent hospital admission for poor ostomy output, who presents today from an outside hospital due to chest pain.  Patient reports that on 11/20 she had feelings that her heart was racing but this resolved.  Then on 11/20 1 at night she began to have chest pain in the middle of her chest that was rated as a 5/10.  It did not radiate to her left shoulder or jaw, but does radiate to her back.  She denies shortness of breath or diaphoresis when this episode.  She described the pain as a tightness and a constriction that is worse when taking a deep breath.  She has not had this pain before.  She drank Coca-Cola because she felt like she had to burp, and this did relieve her pain.  However, later that night the pain returned and was not relieved by Cola so she went to an outside hospital.    At the outside hospital the patient was stable but her labs were significant for a troponin of 0.19, creatinine of 2.34, a D-dimer of 527, and a magnesium of 0.6.  She was given magnesium aspirin and morphine and was transferred to [place] for further care.    On arrival to [place] the patient was stable.  She reports that her pain has improved to a 3/10.  She denies shortness of breath, lightheadedness, palpitations, edema, diaphoresis.  Otherwise the patient says that she has had a better appetite since her last discharge and she is attempting to stay hydrated although she knows this is difficult for her.    Review of Systems (ROS)  Review of Systems   Constitutional: Negative for chills and fever. HENT: Negative for sinus pain and sore throat.    Eyes: Negative for blurred vision.   Respiratory: Negative for cough and shortness of breath.    Cardiovascular: Positive for chest pain and palpitations. Negative for leg swelling.   Gastrointestinal: Negative for abdominal pain, constipation, diarrhea, nausea and vomiting.   Genitourinary: Negative for dysuria.   Musculoskeletal: Negative for back pain.   Skin: Negative for rash.   Neurological: Negative for focal weakness and headaches.     ______________________________    Assessment & Plan    63 year old patient with history of appendiceal cancer status post chemotherapy, immunotherapy, multiple HIPEC surgeries who presented to an outside hospital with substernal chest pain.  She was found to have an elevated troponin and an AKI and was transferred to [facility] for further management.    Chest pain  History of RBBB  -Patient with substernal chest pain that radiates to her back, no known CAD history.  -Echo July 2020 showing EF 65%, mild tricuspid and mitral regurg.  -Troponin at outside hospital 0.19, D-dimer 527  -Chest x-ray at outside hospital showing no cardiac findings  -Patient reports improvement of pain with fentanyl  -DDx: ACS, PE, aortic dissection, costochondritis, GERD  -Pulses equal bilaterally, blood pressure equal bilaterally  -On admission troponins negative, chest x-ray showing no acute findings  -EKG shows chronic right bundle branch block, no ST elevations  -Wells criteria: 1.  Patient scoring for malignancy.  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  Currently does not have stents.  -FeNa 0.2%  -Ddx: likely prerenal secondary to poor H2O absorption in pt s/p colectomy, possible postrenal with history of ureter obstruction and stenting  Plan:  > 1L LR @ 100cc/hr  > Renal ultrasound  > CMP to monitor creatinine  > Replace electrolytes as needed  > Imodium 3 times daily for stool bulking    Appendiceal cancer  -Diagnosed in 2018 after diagnostic laparoscopy.  Findings showing low-grade serous adenocarcinoma.  -Received FOLFOX plus Avastin until 2019  -Began clinical trial with sirolimus and epacadostat in 2019.  Stopped the trial in 05/2018 due to decision to enroll in mitomycin HIPEC trial  -Received 2 HIPEC surgeries 05/2018 and 06/2018, as well as a third in [place] that included debulking.  -Follows with Dr. Rainey Pines and Dr. Josiah Lobo as an outpatient  Plan:  > Inform outpatient team of patient's admission  > Pain management as above      FEN:  Lactated Ringer's at 100 cc/h, electrolytes replaced as needed, regular diet  PPX: Heparin 5,000 Q8 due to renal function  Pain management: Tylenol  Code status:  Full  Disposition: Admission to medical oncology  _______________________    Past Medical History (PMH)  Medical History:   Diagnosis Date   ? Bundle branch block    ? Cancer [facility])    ? Cancer of appendix [facility])      Past Surgical History (PSH)   Surgical History:   Procedure Laterality Date   ? CESAREAN SECTION  1981   ? HX CHOLECYSTECTOMY  1995   ? LAPAROSCOPY  02/2017    biopsy peritoneal   ? COLONOSCOPY DIAGNOSTIC WITH SPECIMEN COLLECTION BY BRUSHING/ WASHING - FLEXIBLE N/A 03/19/2017    Performed by Everardo All, MD at [facility] ENDO   ? COLONOSCOPY WITH BIOPSY - FLEXIBLE  03/19/2017    Performed by Everardo All, MD at [facility] ENDO   ? EXPLORATORY LAPAROTOMY, DIVERTING LOOP ILEOSTOMY N/A 04/03/2017 Performed by Loralie Champagne, MD at [facility] OR   ? INSERTION TUNNELED CENTRAL VENOUS CATHETER - AGE 46 YEARS AND OVER Left 04/23/2017    Performed by Loralie Champagne, MD at [facility] OR   ? CYSTOURETHROSCOPY WITH INDWELLING URETERAL STENT INSERTION Bilateral 04/09/2018    Performed by Earl Many, MD at [facility] OR   ? CYSTOURETHROSCOPY WITH URETERAL CATHETERIZATION WITH/ WITHOUT IRRIGATION/ INSTILLATION/ URETEROPYELOGRAPHY Bilateral 04/09/2018    Performed by Earl Many, MD at [facility] OR   ? CYSTOURETHROSCOPY WITH INDWELLING URETERAL STENT EXCHANGE (RIGHT 6 x 26cm / LEFT 6 x 28cm) Bilateral 08/08/2018    Performed by Earl Many, MD at [facility] OR   ? RETROGRADE UROGRAPHY WITH/ WITHOUT KUB Bilateral 08/08/2018    Performed by Earl Many, MD at [facility] OR   ? ILEOSTOMY OR JEJUNOSTOMY Right    ? TUNNELED VENOUS PORT PLACEMENT Left    ? URETER STENT PLACEMENT Bilateral      Family History (FH)  Family  History   Problem Relation Age of Onset   ? Diabetes Mother    ? Cancer Father    ? Diabetes Brother    ? Cancer-Lung Paternal Aunt    ? Cancer-Breast Maternal Grandmother    ? Diabetes Paternal Grandmother      Social History (SH)  Social History     Tobacco Use   ? Smoking status: Never Smoker   ? Smokeless tobacco: Never Used   Substance Use Topics   ? Alcohol use: No     Frequency: Never     Comment: rarely   ? Drug use: No     Allergies  Shrimp  Reported Home Medications  Prior to Admission medications    Medication Sig Taking?   albuterol sulfate (PROAIR HFA) 90 mcg/actuation aerosol inhaler Inhale 2 puffs by mouth into the lungs every 6 hours as needed for Wheezing or Shortness of Breath. Shake well before use. Yes   loperamide (IMODIUM) 1 mg/5 mL oral solution Take 10 mL by mouth four times daily as needed for up to 200 days. Indications: high-output ileostomy Yes   MULTIVITAMIN PO Take 1 tablet by mouth daily. Yes       Physical Examination  Temp:  [36.4 ?C (97.5 ?F)-36.8 ?C (98.2 ?F)]   Pulse:  [87-91] Respirations:  [16 PER MINUTE-18 PER MINUTE]   SpO2:  [96 %-100 %]   BP: (102-109)/(63-66)   There is no height or weight on file to calculate BMI.    GEN: Alert and oriented x3 (person/place/time). In no acute distress.  EYES: PERRL, EOMI, no scleral icterus.  HENT: Normocephalic, atraumatic.  RESP: Lungs clear to ascultation. No wheezes, rhonchi, nor crackles  CVS: Regular rate and rhythm.  3/6 systolic murmurs at left lower sternal border and apex. Appears and feels warm and well-perfused.  Radial pulses equal bilaterally  GI: Abdomen soft. Non-tender to palpation.  Midline scar.  Ostomy bag in place with stool.  MSK: No visible joint swelling. No obvious deformities. Normal bulk and tone.  NEURO: CNII-XII intact grossly. Strength 5/5 in muscle groups tested.  HEME/LYMPH: No palpable submandibular, anterior/posterior cervical lymphadenopathy.  SKIN: Warm. Dry.  PSYCH: Mood euthymic. Affect congruent with mood.    ________________________    Diagnostic Studies  Labs were reviewed, and notable for:  24-hour labs:    Results for orders placed or performed during the hospital encounter of 02/09/19 (from the past 24 hour(s))   COVID-19 (SARS-COV-2) PCR    Collection Time: 02/09/19 10:16 AM    Specimen: Nasopharyngeal Swab   Result Value Ref Range    COVID-19 (SARS-CoV-2) PCR Source NASOPHARYNGEAL SWAB     COVID-19 (SARS-CoV-2) PCR NOT DETECTED DN-NOT DETECTED   TROPONIN-I    Collection Time: 02/09/19 11:30 AM   Result Value Ref Range    Troponin-I 0.08 (H) 0.0 - 0.05 NG/ML   COMPREHENSIVE METABOLIC PANEL    Collection Time: 02/09/19 11:30 AM   Result Value Ref Range    Sodium 136 (L) 137 - 147 MMOL/L    Potassium 4.3 3.5 - 5.1 MMOL/L    Chloride 93 (L) 98 - 110 MMOL/L    Glucose 78 70 - 100 MG/DL    Blood Urea Nitrogen 43 (H) 7 - 25 MG/DL    Creatinine 9.81 (H) 0.4 - 1.00 MG/DL    Calcium 7.7 (L) 8.5 - 10.6 MG/DL    Total Protein 7.1 6.0 - 8.0 G/DL    Total Bilirubin 0.6 0.3 - 1.2 MG/DL Albumin  3.1 (L) 3.5 - 5.0 G/DL    Alk Phosphatase 454 (H) 25 - 110 U/L    AST (SGOT) 34 7 - 40 U/L    CO2 30 21 - 30 MMOL/L    ALT (SGPT) 36 7 - 56 U/L    Anion Gap 13 (H) 3 - 12    eGFR Non African American 25 (L) >60 mL/min    eGFR African American 30 (L) >60 mL/min   CBC AND DIFF    Collection Time: 02/09/19 11:30 AM   Result Value Ref Range    White Blood Cells 19.1 (H) 4.5 - 11.0 K/UL    RBC 2.90 (L) 4.0 - 5.0 M/UL    Hemoglobin 9.2 (L) 12.0 - 15.0 GM/DL    Hematocrit 09.8 (L) 36 - 45 %    MCV 95.4 80 - 100 FL    MCH 31.7 26 - 34 PG    MCHC 33.3 32.0 - 36.0 G/DL    RDW 11.9 (H) 11 - 15 %    Platelet Count 536 (H) 150 - 400 K/UL    MPV 7.4 7 - 11 FL    Neutrophils 81 (H) 41 - 77 %    Lymphocytes 9 (L) 24 - 44 %    Monocytes 10 4 - 12 %    Eosinophils 0 0 - 5 %    Basophils 0 0 - 2 %    Absolute Neutrophil Count 15.27 (H) 1.8 - 7.0 K/UL    Absolute Lymph Count 1.80 1.0 - 4.8 K/UL    Absolute Monocyte Count 1.94 (H) 0 - 0.80 K/UL    Absolute Eosinophil Count 0.05 0 - 0.45 K/UL    Absolute Basophil Count 0.07 0 - 0.20 K/UL   MAGNESIUM    Collection Time: 02/09/19 11:30 AM   Result Value Ref Range    Magnesium 0.8 (LL) 1.6 - 2.6 mg/dL   CREATININE-URINE RANDOM    Collection Time: 02/09/19  1:20 PM   Result Value Ref Range    Creatinine, Random 97 MG/DL   SODIUM-URINE RANDOM    Collection Time: 02/09/19  1:20 PM   Result Value Ref Range    Sodium, Random 14 MMOL/L   OSMOLALITY-URINE RANDOM    Collection Time: 02/09/19  1:20 PM   Result Value Ref Range    Osmolality-Urine 408 50 - 1,400 MOS/KG   TROPONIN-I    Collection Time: 02/09/19  2:15 PM   Result Value Ref Range    Troponin-I 0.06 (H) 0.0 - 0.05 NG/ML   D-DIMER    Collection Time: 02/09/19  4:18 PM   Result Value Ref Range    D-Dimer 2,014 (H) <500 ng/mL FEU       Imaging studies were reviewed, and notable for:  Chest Single View    Result Date: 02/09/2019 1. Poor visualization of the left lateral costophrenic sulcus, suspicious for small left pleural effusion.  Finalized by Particia Jasper, M.D. on 02/09/2019 10:06 AM. Dictated by Particia Jasper, M.D. on 02/09/2019 10:03 AM.     US Renal Bladder Complete    Result Date: 02/09/2019  1. Persistent mild left hydronephrosis which appear to have not significant changed from prior examination of 12/08/2018. 2. Right inferior pole nephrolithiasis without right hydronephrosis.  Finalized by Particia Jasper, M.D. on 02/09/2019 12:12 PM. Dictated by Particia Jasper, M.D. on 02/09/2019 12:06 PM.       Microbiological studies were reviewed, and notable for:  None found  __________________    Patient Name  [name]  Patient Date of Birth August 30, 1955  Date of Admission 02/09/2019  Primary Care Provider Tackett, Kendal Hymen A  ____________________________________________________________________________________    Marlowe Shores, MD  Internal Medicine, PGY-1  Available on [facility]

## 2019-02-09 NOTE — Progress Notes
Patient had substernal cp to back with associated tightness around 9 pm. She drank a cola and went away. She woke up around 3 am with intense pain. She took tums drank a cola. She was still anxious and came to ED. EKG shows NSR with old BBB ( she has of BBB) trop was slightly elevated at 0.19. She is currently chest pain free. Pmhx- BBB- cancer of appendix, ileostomy  ( only 85 lbs)    Vs- BP- 125/72 hr-94- sats 99%  resp 17 afebrile 98.4    Meds given: Mg replacement , ASA 324 mg- 4 mg morphine, 1 liter NS, 1 sl NTG- having associated pain with inspiration     Labs- WBC 17 - baseline for pt- 12-14  H/H 11.6/ 35  PLT 595  NA 136  K- 4.8  BUN 46  CR-2.34  normal 1.6  CO2-30 CL- 88  INR- 1.2  PT-25.4  D-Dimer 527  MG 0.6- giving  1 gram    CXR- clear- no cardiopulmonary issues

## 2019-02-09 NOTE — Progress Notes
Patient arrived to room # [id number] via cart accompanied by EMS. Patient transferred to the bed without assistance. Bedside safety checks completed. Initial patient assessment completed. Refer to flowsheet for details.    Admission skin assessment completed with: Jillian, RN    Pressure injury present on arrival?: Yes    1. Head/Face/Neck: No  2. Trunk/Back: No  3. Upper Extremities: No  4. Lower Extremities: No  5. Pelvic/Coccyx: Yes  6. Assessed for device associated injury? Yes  7. Malnutrition Screening Tool (Nursing Nutrition Assessment) Completed? Yes    See Doc Flowsheet for additional wound details.     INTERVENTIONS: RN will continue to monitor skin for changes.

## 2019-02-10 ENCOUNTER — Encounter: Admit: 2019-02-10 | Discharge: 2019-02-10 | Payer: BC Managed Care – PPO

## 2019-02-10 ENCOUNTER — Inpatient Hospital Stay: Admit: 2019-02-10 | Discharge: 2019-02-10 | Payer: BC Managed Care – PPO

## 2019-02-10 DIAGNOSIS — C181 Malignant neoplasm of appendix: Secondary | ICD-10-CM

## 2019-02-10 DIAGNOSIS — I454 Nonspecific intraventricular block: Secondary | ICD-10-CM

## 2019-02-10 DIAGNOSIS — C801 Malignant (primary) neoplasm, unspecified: Secondary | ICD-10-CM

## 2019-02-10 LAB — TROPONIN-I: Lab: 0 ng/mL — ABNORMAL HIGH (ref 0.0–0.05)

## 2019-02-10 MED ORDER — RP DX TC-99M MAA MCI
5 | Freq: Once | INTRAVENOUS | 0 refills | Status: CP
Start: 2019-02-10 — End: ?
  Administered 2019-02-10: 16:00:00 5.1 via INTRAVENOUS

## 2019-02-10 MED ORDER — LACTATED RINGERS IV SOLP
1500 mL | Freq: Once | INTRAVENOUS | 0 refills | Status: CP
Start: 2019-02-10 — End: ?
  Administered 2019-02-10: 20:00:00 1500 mL via INTRAVENOUS

## 2019-02-11 ENCOUNTER — Encounter: Admit: 2019-02-11 | Discharge: 2019-02-11 | Payer: BC Managed Care – PPO

## 2019-02-11 MED ORDER — NS WITH POTASSIUM CHLOR 20 MEQ, MAG SULF 1 G/L IV INFUSION
Freq: Once | INTRAVENOUS | 0 refills | Status: CN
Start: 2019-02-11 — End: ?

## 2019-02-11 MED ORDER — ENOXAPARIN 40 MG/0.4 ML SC SYRG
40 mg | Freq: Every day | SUBCUTANEOUS | 2 refills | 7.00000 days | Status: DC
Start: 2019-02-11 — End: 2019-03-17
  Filled 2019-02-11: qty 12, 30d supply, fill #1

## 2019-02-11 MED ORDER — NS WITH POTASSIUM CHLOR 20 MEQ, MAG SULF 2 G/L IV INFUSION
0 refills | 30.00000 days | Status: AC
Start: 2019-02-11 — End: ?

## 2019-02-11 MED ORDER — NS WITH POTASSIUM CHLOR 20 MEQ, MAG SULF 1 G/L IV INFUSION
Freq: Once | INTRAVENOUS | 0 refills | Status: CP
Start: 2019-02-11 — End: ?
  Administered 2019-02-11 (×3): 1000.000 mL via INTRAVENOUS

## 2019-02-11 MED ORDER — FAMOTIDINE 10 MG PO TAB
10 mg | ORAL_TABLET | Freq: Two times a day (BID) | ORAL | 0 refills | 90.00000 days | Status: AC
Start: 2019-02-11 — End: ?

## 2019-02-11 MED ORDER — ENOXAPARIN 40 MG/0.4 ML SC SYRG
40 mg | Freq: Every day | SUBCUTANEOUS | 0 refills | Status: DC
Start: 2019-02-11 — End: 2019-02-12
  Administered 2019-02-11: 15:00:00 40 mg via SUBCUTANEOUS

## 2019-02-12 ENCOUNTER — Encounter: Admit: 2019-02-12 | Discharge: 2019-02-12 | Payer: BC Managed Care – PPO

## 2019-02-12 MED ORDER — ONDANSETRON IVPB
16 mg | Freq: Once | INTRAVENOUS | 0 refills | Status: CN
Start: 2019-02-12 — End: ?

## 2019-02-12 MED ORDER — DEXAMETHASONE SODIUM PHOS (PF) 10 MG/ML IJ SOLN
10 mg | Freq: Once | INTRAVENOUS | 0 refills | Status: CN
Start: 2019-02-12 — End: ?

## 2019-02-12 MED ORDER — SODIUM CHLORIDE 0.9 % IV SOLP
1000 mL | INTRAVENOUS | 0 refills | Status: CN
Start: 2019-02-12 — End: ?

## 2019-02-12 NOTE — Telephone Encounter
CT cancelled per Dr. Eldred Manges, creatinine too high. Patient under the impression she did not need a follow up at this time, was not planning to travel to [facility] today. Per Dr. Eldred Manges, he would like to see her by tele health at least, due to scheduling constraints for the holidays. Patient agreeable to Zoom. All questions answered to satisfaction.

## 2019-02-15 ENCOUNTER — Encounter: Admit: 2019-02-15 | Discharge: 2019-02-15 | Payer: BC Managed Care – PPO

## 2019-02-15 DIAGNOSIS — C181 Malignant neoplasm of appendix: Secondary | ICD-10-CM

## 2019-02-15 DIAGNOSIS — I454 Nonspecific intraventricular block: Secondary | ICD-10-CM

## 2019-02-15 DIAGNOSIS — C801 Malignant (primary) neoplasm, unspecified: Secondary | ICD-10-CM

## 2019-02-21 ENCOUNTER — Encounter: Admit: 2019-02-21 | Discharge: 2019-02-21 | Payer: BC Managed Care – PPO

## 2019-02-21 DIAGNOSIS — C786 Secondary malignant neoplasm of retroperitoneum and peritoneum: Secondary | ICD-10-CM

## 2019-02-21 MED ORDER — DEXAMETHASONE SODIUM PHOS (PF) 10 MG/ML IJ SOLN
10 mg | Freq: Once | INTRAVENOUS | 0 refills | Status: DC
Start: 2019-02-21 — End: 2019-02-26

## 2019-02-21 MED ORDER — MAGNESIUM SULFATE IN D5W 1 GRAM/100 ML IV PGBK
1 g | Freq: Once | INTRAVENOUS | 0 refills | Status: CP
Start: 2019-02-21 — End: ?
  Administered 2019-02-21: 23:00:00 1 g via INTRAVENOUS

## 2019-02-21 MED ORDER — MAGNESIUM SULFATE IN D5W 1 GRAM/100 ML IV PGBK
1 g | Freq: Once | INTRAVENOUS | 0 refills | Status: CP
Start: 2019-02-21 — End: ?
  Administered 2019-02-21: 22:00:00 1 g via INTRAVENOUS

## 2019-02-21 MED ORDER — SODIUM CHLORIDE 0.9 % IV SOLP
1000 mL | INTRAVENOUS | 0 refills | Status: CP
Start: 2019-02-21 — End: ?
  Administered 2019-02-21: 22:00:00 1000 mL via INTRAVENOUS

## 2019-02-21 MED ORDER — ONDANSETRON IVPB
16 mg | Freq: Once | INTRAVENOUS | 0 refills | Status: DC
Start: 2019-02-21 — End: 2019-02-26

## 2019-02-21 MED ORDER — HEPARIN, PORCINE (PF) 100 UNIT/ML IV SYRG
500 [IU] | Freq: Once | 0 refills | Status: CP
Start: 2019-02-21 — End: ?

## 2019-02-21 NOTE — Patient Instructions
[name]  Chemotherapy Instructions    Vicki Buchanan 02/21/2019      Call Immediately to report the following:  Unexplained bleeding or bleeding that will not stop  Difficulty swallowing  Shortness of breath, wheezing, or trouble breathing  Rapid, irregular heartbeat; chest pain  Dizziness, lightheadedness  Rash or cut that swells or turns red, feels hot or painful, or begin to ooze  Diarrhea   Uncontrolled nausea or vomiting  Fever of 100.4 F or higher, or chills    Important Phone Numbers:  [place] Number (answered 24 hours a day) [phone]

## 2019-02-28 ENCOUNTER — Encounter: Admit: 2019-02-28 | Discharge: 2019-02-28 | Payer: BC Managed Care – PPO

## 2019-02-28 DIAGNOSIS — C786 Secondary malignant neoplasm of retroperitoneum and peritoneum: Secondary | ICD-10-CM

## 2019-02-28 LAB — CBC AND DIFF
Lab: 1.4 10*3/uL — ABNORMAL HIGH (ref 0–0.80)
Lab: 100 FL — ABNORMAL HIGH (ref 80–100)
Lab: 14 % (ref 11–15)
Lab: 15 % — ABNORMAL HIGH (ref 4–12)
Lab: 2.1 10*3/uL (ref 1.0–4.8)
Lab: 2.7 M/UL — ABNORMAL LOW (ref 4.0–5.0)
Lab: 23 % — ABNORMAL LOW (ref 24–44)
Lab: 27 % — ABNORMAL LOW (ref 36–45)
Lab: 33 g/dL (ref 32.0–36.0)
Lab: 33 pg (ref 26–34)
Lab: 5.5 10*3/uL (ref 1.8–7.0)
Lab: 59 % (ref 41–77)
Lab: 7.3 FL (ref 7–11)
Lab: 709 10*3/uL — ABNORMAL HIGH (ref 150–400)
Lab: 9.3 10*3/uL (ref 4.5–11.0)
Lab: 9.3 g/dL — ABNORMAL LOW (ref 12.0–15.0)

## 2019-02-28 LAB — CEA(CARCINOEMBRYONIC AG): Lab: 665 ng/mL — ABNORMAL HIGH (ref <3.0)

## 2019-02-28 MED ORDER — ONDANSETRON IVPB
16 mg | Freq: Once | INTRAVENOUS | 0 refills | Status: DC
Start: 2019-02-28 — End: 2019-03-05

## 2019-02-28 MED ORDER — HEPARIN, PORCINE (PF) 100 UNIT/ML IV SYRG
500 [IU] | Freq: Once | 0 refills | Status: CP
Start: 2019-02-28 — End: ?

## 2019-02-28 MED ORDER — SODIUM CHLORIDE 0.9 % IV SOLP
1000 mL | INTRAVENOUS | 0 refills | Status: CP
Start: 2019-02-28 — End: ?
  Administered 2019-02-28: 20:00:00 1000 mL via INTRAVENOUS

## 2019-02-28 MED ORDER — MAGNESIUM SULFATE IN D5W 1 GRAM/100 ML IV PGBK
1 g | Freq: Once | INTRAVENOUS | 0 refills | Status: CP
Start: 2019-02-28 — End: ?
  Administered 2019-02-28: 20:00:00 1 g via INTRAVENOUS

## 2019-02-28 MED ORDER — DEXAMETHASONE SODIUM PHOS (PF) 10 MG/ML IJ SOLN
10 mg | Freq: Once | INTRAVENOUS | 0 refills | Status: DC
Start: 2019-02-28 — End: 2019-03-05

## 2019-02-28 NOTE — Progress Notes
Intravenous fluids were administered, normal saline 1000 mL and 1 GM Magnesium administered. Patient tolerated and has no further questions or concerns at this time.

## 2019-03-07 ENCOUNTER — Encounter: Admit: 2019-03-07 | Discharge: 2019-03-07 | Payer: BC Managed Care – PPO

## 2019-03-07 DIAGNOSIS — C786 Secondary malignant neoplasm of retroperitoneum and peritoneum: Secondary | ICD-10-CM

## 2019-03-07 MED ORDER — SODIUM CHLORIDE 0.9 % IV SOLP
1000 mL | INTRAVENOUS | 0 refills | Status: CP
Start: 2019-03-07 — End: ?
  Administered 2019-03-07: 15:00:00 1000 mL via INTRAVENOUS

## 2019-03-07 MED ORDER — DEXAMETHASONE SODIUM PHOS (PF) 10 MG/ML IJ SOLN
10 mg | Freq: Once | INTRAVENOUS | 0 refills | Status: DC
Start: 2019-03-07 — End: 2019-03-12

## 2019-03-07 MED ORDER — ONDANSETRON IVPB
16 mg | Freq: Once | INTRAVENOUS | 0 refills | Status: DC
Start: 2019-03-07 — End: 2019-03-12

## 2019-03-07 MED ORDER — HEPARIN, PORCINE (PF) 100 UNIT/ML IV SYRG
500 [IU] | Freq: Once | 0 refills | Status: CP
Start: 2019-03-07 — End: ?

## 2019-03-07 MED ORDER — MAGNESIUM SULFATE IN D5W 1 GRAM/100 ML IV PGBK
1 g | Freq: Once | INTRAVENOUS | 0 refills | Status: CP
Start: 2019-03-07 — End: ?
  Administered 2019-03-07: 15:00:00 1 g via INTRAVENOUS

## 2019-03-10 ENCOUNTER — Encounter: Admit: 2019-03-10 | Discharge: 2019-03-10 | Payer: BC Managed Care – PPO

## 2019-03-12 ENCOUNTER — Encounter: Admit: 2019-03-12 | Discharge: 2019-03-12 | Payer: BC Managed Care – PPO

## 2019-03-12 DIAGNOSIS — C786 Secondary malignant neoplasm of retroperitoneum and peritoneum: Secondary | ICD-10-CM

## 2019-03-12 NOTE — Telephone Encounter
This RN spoke with patient regarding upcoming CT appointment on 12/31and appointment with Dr. Eldred Manges. Appt scheduled with Dr. Eldred Manges on 12/30 moved to after her CT scans so they can discuss her labs and CT on 01/06. Patient aware of appointment change and was agreeable to this plan. She denied questions or need for anything else at this time.

## 2019-03-13 ENCOUNTER — Encounter: Admit: 2019-03-13 | Discharge: 2019-03-13 | Payer: BC Managed Care – PPO

## 2019-03-13 DIAGNOSIS — C786 Secondary malignant neoplasm of retroperitoneum and peritoneum: Secondary | ICD-10-CM

## 2019-03-13 MED ORDER — MAGNESIUM SULFATE IN D5W 1 GRAM/100 ML IV PGBK
1 g | Freq: Once | INTRAVENOUS | 0 refills | Status: CP
Start: 2019-03-13 — End: ?
  Administered 2019-03-13: 15:00:00 1 g via INTRAVENOUS

## 2019-03-13 MED ORDER — ONDANSETRON IVPB
16 mg | Freq: Once | INTRAVENOUS | 0 refills | Status: DC
Start: 2019-03-13 — End: 2019-03-18

## 2019-03-13 MED ORDER — SODIUM CHLORIDE 0.9 % IV SOLP
1000 mL | INTRAVENOUS | 0 refills | Status: CP
Start: 2019-03-13 — End: ?
  Administered 2019-03-13: 15:00:00 1000 mL via INTRAVENOUS

## 2019-03-13 MED ORDER — DEXAMETHASONE SODIUM PHOS (PF) 10 MG/ML IJ SOLN
10 mg | Freq: Once | INTRAVENOUS | 0 refills | Status: DC
Start: 2019-03-13 — End: 2019-03-18

## 2019-03-13 MED ORDER — HEPARIN, PORCINE (PF) 100 UNIT/ML IV SYRG
500 [IU] | Freq: Once | 0 refills | Status: CP
Start: 2019-03-13 — End: ?

## 2019-03-13 NOTE — Progress Notes
Patient arrived to CC treatment. VSS, PAC in place and positive for blood return, and assessment complete; please refer to doc flowsheet for details. 1L IVF and Mag given w/o complications, patient tolerated well. Patient refused dexamethasone and zofran. Port positive for blood return, flushed with heparin, and deaccessed per protocol. Patient provided copy of labs and declined AVS. All questions and concerns addressed. Patient left CC treatment in stable condition.

## 2019-03-17 ENCOUNTER — Encounter: Admit: 2019-03-17 | Discharge: 2019-03-17 | Payer: BC Managed Care – PPO

## 2019-03-17 MED ORDER — ENOXAPARIN 40 MG/0.4 ML SC SYRG
40 mg | Freq: Every day | SUBCUTANEOUS | 2 refills | 7.00000 days | Status: DC
Start: 2019-03-17 — End: 2019-05-02

## 2019-03-19 ENCOUNTER — Encounter: Admit: 2019-03-19 | Discharge: 2019-03-19 | Payer: BC Managed Care – PPO

## 2019-03-19 NOTE — Telephone Encounter
Received VM from [place] at [facility].  She reports receiving RX for Lovenox.  She reports that quantity was for 12 mL and she wanted to know if that was supposed to be 12 mL or 12 shots.  12 mL would be a 30 day supply.  This RN called and notified Nira Conn that RX is correct in that 12 mL is a 30 day supply for this medication.  She verbalized understanding and denies further needs.

## 2019-03-20 ENCOUNTER — Encounter: Admit: 2019-03-20 | Discharge: 2019-03-20 | Payer: BC Managed Care – PPO

## 2019-03-20 DIAGNOSIS — C786 Secondary malignant neoplasm of retroperitoneum and peritoneum: Secondary | ICD-10-CM

## 2019-03-26 ENCOUNTER — Encounter

## 2019-03-26 DIAGNOSIS — I454 Nonspecific intraventricular block: Secondary | ICD-10-CM

## 2019-03-26 DIAGNOSIS — C801 Malignant (primary) neoplasm, unspecified: Secondary | ICD-10-CM

## 2019-03-26 DIAGNOSIS — C786 Secondary malignant neoplasm of retroperitoneum and peritoneum: Secondary | ICD-10-CM

## 2019-03-26 DIAGNOSIS — C181 Malignant neoplasm of appendix: Secondary | ICD-10-CM

## 2019-03-28 ENCOUNTER — Encounter: Admit: 2019-03-28 | Discharge: 2019-03-28 | Payer: BC Managed Care – PPO

## 2019-03-28 NOTE — Progress Notes
This RN filled out Quantity Exception Prescriber fax form that patient sent via mychart that her insurance company was requesting. Faxed to: [place] [employer] (Fax #: [phone]). Fax confirmation received. Patient notified.

## 2019-03-29 ENCOUNTER — Encounter: Admit: 2019-03-29 | Discharge: 2019-03-29 | Payer: BC Managed Care – PPO

## 2019-03-29 DIAGNOSIS — C181 Malignant neoplasm of appendix: Secondary | ICD-10-CM

## 2019-03-29 DIAGNOSIS — I454 Nonspecific intraventricular block: Secondary | ICD-10-CM

## 2019-03-29 DIAGNOSIS — C801 Malignant (primary) neoplasm, unspecified: Secondary | ICD-10-CM

## 2019-03-31 ENCOUNTER — Encounter: Admit: 2019-03-31 | Discharge: 2019-03-31 | Payer: BC Managed Care – PPO

## 2019-03-31 DIAGNOSIS — C786 Secondary malignant neoplasm of retroperitoneum and peritoneum: Secondary | ICD-10-CM

## 2019-03-31 NOTE — Telephone Encounter
LVM For pt about new appts added on 3/30 per staff msg. Informed to check mychart or call scheduling line for more info.

## 2019-03-31 NOTE — Telephone Encounter
-----   Message from Melany Guernsey, [place] sent at 03/31/2019  9:25 AM CST -----  Regarding: CT CAP/ labs  Please schedule patient for imaging labs with CT CAP prior to 3/31 telehealth appt with Dr. Eldred Manges. Please notify patient when this is scheduled. Thank you!

## 2019-04-04 ENCOUNTER — Encounter: Admit: 2019-04-04 | Discharge: 2019-04-04 | Payer: BC Managed Care – PPO

## 2019-04-04 ENCOUNTER — Ambulatory Visit: Admit: 2019-04-04 | Discharge: 2019-04-05 | Payer: BC Managed Care – PPO

## 2019-04-04 DIAGNOSIS — C801 Malignant (primary) neoplasm, unspecified: Secondary | ICD-10-CM

## 2019-04-04 DIAGNOSIS — C181 Malignant neoplasm of appendix: Secondary | ICD-10-CM

## 2019-04-04 DIAGNOSIS — I454 Nonspecific intraventricular block: Secondary | ICD-10-CM

## 2019-04-05 DIAGNOSIS — C786 Secondary malignant neoplasm of retroperitoneum and peritoneum: Secondary | ICD-10-CM

## 2019-04-05 DIAGNOSIS — C801 Malignant (primary) neoplasm, unspecified: Secondary | ICD-10-CM

## 2019-04-09 NOTE — Telephone Encounter
Rec'd VM from [facility] with Dr. Cherrie Gauze office. She wanted to let Dr. Johnn Hai know they did labs today and will be faxing a copy of these over to our office. They are also administering 1L of IV fluids and in case Dr. Johnn Hai wants to contact them [facility] # is [phone]. Waiting to receive lab results.

## 2019-04-11 ENCOUNTER — Encounter: Admit: 2019-04-11 | Discharge: 2019-04-11 | Payer: BC Managed Care – PPO

## 2019-04-11 DIAGNOSIS — C181 Malignant neoplasm of appendix: Secondary | ICD-10-CM

## 2019-04-11 DIAGNOSIS — C801 Malignant (primary) neoplasm, unspecified: Secondary | ICD-10-CM

## 2019-04-11 DIAGNOSIS — I454 Nonspecific intraventricular block: Secondary | ICD-10-CM

## 2019-04-29 ENCOUNTER — Encounter: Admit: 2019-04-29 | Discharge: 2019-04-29 | Payer: BC Managed Care – PPO

## 2019-04-29 NOTE — Progress Notes
Rec'd labs from pt PCP office. Cr up to 2.3, they have given her fluids after each lab draw. Dr. Johnn Hai notified. Results scanned into O2 for historical purposes.

## 2019-05-01 ENCOUNTER — Encounter: Admit: 2019-05-01 | Discharge: 2019-05-01 | Payer: BC Managed Care – PPO

## 2019-05-01 MED ORDER — ENOXAPARIN 40 MG/0.4 ML SC SYRG
40 mg | Freq: Every day | SUBCUTANEOUS | 2 refills | Status: CN
Start: 2019-05-01 — End: ?

## 2019-05-02 ENCOUNTER — Encounter: Admit: 2019-05-02 | Discharge: 2019-05-02 | Payer: BC Managed Care – PPO

## 2019-05-02 MED ORDER — ENOXAPARIN 40 MG/0.4 ML SC SYRG
40 mg | Freq: Every day | SUBCUTANEOUS | 2 refills | 7.00000 days | Status: DC
Start: 2019-05-02 — End: 2019-05-28

## 2019-05-05 ENCOUNTER — Encounter: Admit: 2019-05-05 | Discharge: 2019-05-05 | Payer: BC Managed Care – PPO

## 2019-05-09 ENCOUNTER — Encounter: Admit: 2019-05-09 | Discharge: 2019-05-09 | Payer: BC Managed Care – PPO

## 2019-05-09 NOTE — Telephone Encounter
Spoke with Mohogany Sonday verbalized understanding of rescheduled appointment from 05/14/2019 to 06/18/2019 telehealth with Dr. Alvan Dame at 11:20am per patient request.

## 2019-05-09 NOTE — Telephone Encounter
-----   Message from Alexis Goodell, RN sent at 05/09/2019 11:01 AM CST -----  Please call patient and reschedule appt w/ Dr. Durwin Nora to 3/31.    Thank you

## 2019-05-15 ENCOUNTER — Encounter: Admit: 2019-05-15 | Discharge: 2019-05-15 | Payer: BC Managed Care – PPO

## 2019-05-15 NOTE — Progress Notes
Rec'd BMP's from pt PCP office. Scanned into O2 for historical purposes. Dr. Johnn Hai notified of results.

## 2019-05-28 ENCOUNTER — Encounter: Admit: 2019-05-28 | Discharge: 2019-05-28 | Payer: BC Managed Care – PPO

## 2019-05-28 MED ORDER — ENOXAPARIN 40 MG/0.4 ML SC SYRG
40 mg | Freq: Every day | SUBCUTANEOUS | 2 refills | 7.00000 days | Status: AC
Start: 2019-05-28 — End: ?

## 2019-06-17 ENCOUNTER — Encounter: Admit: 2019-06-17 | Discharge: 2019-06-17 | Payer: BC Managed Care – PPO

## 2019-06-17 DIAGNOSIS — C786 Secondary malignant neoplasm of retroperitoneum and peritoneum: Secondary | ICD-10-CM

## 2019-06-17 LAB — COMPREHENSIVE METABOLIC PANEL
Lab: 0.3 mg/dL (ref 0.3–1.2)
Lab: 1.7 mg/dL — ABNORMAL HIGH (ref 0.4–1.00)
Lab: 102 MMOL/L (ref 98–110)
Lab: 137 MMOL/L (ref 137–147)
Lab: 26 U/L — ABNORMAL HIGH (ref 7–56)
Lab: 27 MMOL/L (ref 21–30)
Lab: 29 mL/min — ABNORMAL LOW (ref >60)
Lab: 3.5 g/dL (ref 3.5–5.0)
Lab: 30 U/L (ref 7–40)
Lab: 31 mg/dL — ABNORMAL HIGH (ref 7–25)
Lab: 35 mL/min — ABNORMAL LOW (ref >60)
Lab: 355 U/L — ABNORMAL HIGH (ref 25–110)
Lab: 4.5 MMOL/L — ABNORMAL HIGH (ref 3.5–5.1)
Lab: 7.6 g/dL — ABNORMAL LOW (ref 6.0–8.0)
Lab: 8 10*3/uL — ABNORMAL HIGH (ref 3–12)
Lab: 80 mg/dL (ref 70–100)
Lab: 9.3 mg/dL (ref 8.5–10.6)

## 2019-06-17 LAB — CBC AND DIFF
Lab: 10 10*3/uL (ref 4.5–11.0)
Lab: 2.9 M/UL — ABNORMAL LOW (ref 4.0–5.0)
Lab: 29 % — ABNORMAL LOW (ref 36–45)

## 2019-06-17 LAB — CEA(CARCINOEMBRYONIC AG): Lab: 672 ng/mL — ABNORMAL HIGH (ref <3.0)

## 2019-06-17 LAB — POC CREATININE, RAD: Lab: 2 mg/dL — ABNORMAL HIGH (ref 0.4–1.00)

## 2019-06-17 MED ORDER — IOHEXOL 350 MG IODINE/ML IV SOLN
100 mL | Freq: Once | INTRAVENOUS | 0 refills | Status: AC
Start: 2019-06-17 — End: 2019-06-22

## 2019-06-17 MED ORDER — HEPARIN, PORCINE (PF) 100 UNIT/ML IV SYRG
500 [IU] | Freq: Once | 0 refills | Status: CP
Start: 2019-06-17 — End: ?

## 2019-06-17 MED ORDER — SODIUM CHLORIDE 0.9 % IJ SOLN
50 mL | Freq: Once | INTRAVENOUS | 0 refills | Status: AC
Start: 2019-06-17 — End: 2019-06-22

## 2019-06-18 ENCOUNTER — Encounter: Admit: 2019-06-18 | Discharge: 2019-06-18 | Payer: BC Managed Care – PPO

## 2019-06-18 DIAGNOSIS — C786 Secondary malignant neoplasm of retroperitoneum and peritoneum: Secondary | ICD-10-CM

## 2019-06-18 DIAGNOSIS — C801 Malignant (primary) neoplasm, unspecified: Secondary | ICD-10-CM

## 2019-06-18 DIAGNOSIS — C181 Malignant neoplasm of appendix: Secondary | ICD-10-CM

## 2019-06-18 DIAGNOSIS — I454 Nonspecific intraventricular block: Secondary | ICD-10-CM

## 2019-06-19 ENCOUNTER — Encounter: Admit: 2019-06-19 | Discharge: 2019-06-19 | Payer: BC Managed Care – PPO

## 2019-06-19 NOTE — Telephone Encounter
Spoke with pt scheduled appointments

## 2019-06-20 ENCOUNTER — Encounter: Admit: 2019-06-20 | Discharge: 2019-06-20 | Payer: BC Managed Care – PPO

## 2019-06-20 DIAGNOSIS — C801 Malignant (primary) neoplasm, unspecified: Secondary | ICD-10-CM

## 2019-06-20 DIAGNOSIS — I454 Nonspecific intraventricular block: Secondary | ICD-10-CM

## 2019-06-20 DIAGNOSIS — C181 Malignant neoplasm of appendix: Secondary | ICD-10-CM

## 2019-06-26 ENCOUNTER — Encounter: Admit: 2019-06-26 | Discharge: 2019-06-26 | Payer: BC Managed Care – PPO

## 2019-09-03 ENCOUNTER — Encounter: Admit: 2019-09-03 | Discharge: 2019-09-03 | Payer: BC Managed Care – PPO

## 2019-09-03 NOTE — Telephone Encounter
Rec'd call from [facility] with Dr. Glenetta Hew office regarding pt. She has been getting weekly labs with fluids and they are wondering what further POC will be as creatinine not getting better. Advised have not rec'd labs in awhile and Lurena Joiner reports that Cr today is 3.4 - previously was 2.9. Advised to go ahead and give fluids today and fax over trend of pt labs and will discuss with Dr. Marjie Skiff and see when we can get her in for an appointment. Lurena Joiner voiced understanding. Awaiting lab results.   Spoke with Dr. Marjie Skiff and notified, he actually spoke with pt the other day. Agrees with pt getting fluid today. Last 3 lab results scanned in for provider review and will f/u regarding plan.

## 2019-09-05 ENCOUNTER — Encounter: Admit: 2019-09-05 | Discharge: 2019-09-05 | Payer: BC Managed Care – PPO

## 2019-09-24 ENCOUNTER — Encounter: Admit: 2019-09-24 | Discharge: 2019-09-24 | Payer: BC Managed Care – PPO

## 2019-09-24 DIAGNOSIS — C181 Malignant neoplasm of appendix: Secondary | ICD-10-CM

## 2019-09-24 DIAGNOSIS — C801 Malignant (primary) neoplasm, unspecified: Secondary | ICD-10-CM

## 2019-09-24 DIAGNOSIS — C786 Secondary malignant neoplasm of retroperitoneum and peritoneum: Secondary | ICD-10-CM

## 2019-09-24 DIAGNOSIS — I454 Nonspecific intraventricular block: Secondary | ICD-10-CM

## 2019-09-24 LAB — CBC AND DIFF
Lab: 0 % (ref 0–2)
Lab: 0.1 10*3/uL (ref 0–0.20)
Lab: 0.4 10*3/uL (ref 0–0.45)
Lab: 1.4 10*3/uL — ABNORMAL HIGH (ref 0–0.80)
Lab: 10 g/dL — ABNORMAL LOW (ref 12.0–15.0)
Lab: 101 FL — ABNORMAL HIGH (ref 80–100)
Lab: 11 % — ABNORMAL LOW (ref >60)
Lab: 13 % (ref 11–15)
Lab: 13 K/UL — ABNORMAL HIGH (ref 4.5–11.0)
Lab: 16 % — ABNORMAL LOW (ref >60)
Lab: 2.1 10*3/uL (ref 1.0–4.8)
Lab: 2.9 M/UL — ABNORMAL LOW (ref 4.0–5.0)
Lab: 29 % — ABNORMAL LOW (ref 36–45)
Lab: 3 % (ref 0–5)
Lab: 33 g/dL — ABNORMAL HIGH (ref 32.0–36.0)
Lab: 34 pg — ABNORMAL HIGH (ref 26–34)
Lab: 557 K/UL — ABNORMAL HIGH (ref 150–400)
Lab: 6.8 FL — ABNORMAL LOW (ref 7–11)
Lab: 70 % (ref 41–77)
Lab: 9 10*3/uL — ABNORMAL HIGH (ref 1.8–7.0)

## 2019-09-24 LAB — COMPREHENSIVE METABOLIC PANEL
Lab: 102 MMOL/L (ref 98–110)
Lab: 105 mg/dL — ABNORMAL HIGH (ref 70–100)
Lab: 137 MMOL/L (ref 137–147)
Lab: 4.6 MMOL/L (ref 3.5–5.1)

## 2019-09-24 LAB — POC CREATININE, RAD: Lab: 2.5 mg/dL — ABNORMAL HIGH (ref 0.4–1.00)

## 2019-09-24 LAB — MAGNESIUM: Lab: 1.4 mg/dL — ABNORMAL LOW (ref 1.6–2.6)

## 2019-09-24 LAB — CEA(CARCINOEMBRYONIC AG): Lab: 188 ng/mL — ABNORMAL HIGH (ref <3.0)

## 2019-09-24 MED ORDER — SODIUM CHLORIDE 0.9 % IJ SOLN
50 mL | Freq: Once | INTRAVENOUS | 0 refills | Status: DC
Start: 2019-09-24 — End: 2019-09-29

## 2019-09-24 MED ORDER — HEPARIN, PORCINE (PF) 100 UNIT/ML IV SYRG
500 [IU] | Freq: Once | 0 refills | Status: CP
Start: 2019-09-24 — End: ?

## 2019-09-24 MED ORDER — IOHEXOL 350 MG IODINE/ML IV SOLN
100 mL | Freq: Once | INTRAVENOUS | 0 refills | Status: DC
Start: 2019-09-24 — End: 2019-09-29

## 2019-10-01 ENCOUNTER — Encounter: Admit: 2019-10-01 | Discharge: 2019-10-01 | Payer: BC Managed Care – PPO

## 2019-10-07 ENCOUNTER — Encounter: Admit: 2019-10-07 | Discharge: 2019-10-07 | Payer: BC Managed Care – PPO

## 2019-10-07 DIAGNOSIS — I454 Nonspecific intraventricular block: Secondary | ICD-10-CM

## 2019-10-07 DIAGNOSIS — C801 Malignant (primary) neoplasm, unspecified: Secondary | ICD-10-CM

## 2019-10-07 DIAGNOSIS — C786 Secondary malignant neoplasm of retroperitoneum and peritoneum: Secondary | ICD-10-CM

## 2019-10-07 DIAGNOSIS — C181 Malignant neoplasm of appendix: Secondary | ICD-10-CM

## 2019-10-08 ENCOUNTER — Encounter: Admit: 2019-10-08 | Discharge: 2019-10-08 | Payer: BC Managed Care – PPO

## 2019-10-08 NOTE — Telephone Encounter
-----   Message from Arelia Longest, RN sent at 10/07/2019  3:33 PM CDT -----  Regarding: PET scan needed  Please schedule PET scan for as soon as possible. Please notify patient of date, time and location.    Thank you!

## 2019-10-10 ENCOUNTER — Encounter: Admit: 2019-10-10 | Discharge: 2019-10-10 | Payer: BC Managed Care – PPO

## 2019-10-15 ENCOUNTER — Encounter: Admit: 2019-10-15 | Discharge: 2019-10-15 | Payer: BC Managed Care – PPO

## 2019-12-16 ENCOUNTER — Encounter: Admit: 2019-12-16 | Discharge: 2019-12-16

## 2019-12-19 ENCOUNTER — Encounter: Admit: 2019-12-19 | Discharge: 2019-12-19 | Payer: MEDICARE

## 2019-12-19 ENCOUNTER — Encounter: Admit: 2019-12-19 | Discharge: 2019-12-19

## 2019-12-19 DIAGNOSIS — C181 Malignant neoplasm of appendix: Secondary | ICD-10-CM

## 2019-12-19 DIAGNOSIS — C801 Malignant (primary) neoplasm, unspecified: Secondary | ICD-10-CM

## 2019-12-19 DIAGNOSIS — I454 Nonspecific intraventricular block: Secondary | ICD-10-CM

## 2019-12-22 ENCOUNTER — Encounter: Admit: 2019-12-22 | Discharge: 2019-12-22

## 2019-12-22 DIAGNOSIS — I454 Nonspecific intraventricular block: Secondary | ICD-10-CM

## 2019-12-22 DIAGNOSIS — C801 Malignant (primary) neoplasm, unspecified: Secondary | ICD-10-CM

## 2019-12-22 DIAGNOSIS — C181 Malignant neoplasm of appendix: Secondary | ICD-10-CM

## 2020-03-24 ENCOUNTER — Encounter: Admit: 2020-03-24 | Discharge: 2020-03-24 | Payer: MEDICARE

## 2020-03-24 DIAGNOSIS — C786 Secondary malignant neoplasm of retroperitoneum and peritoneum: Secondary | ICD-10-CM

## 2020-03-24 DIAGNOSIS — C801 Malignant (primary) neoplasm, unspecified: Secondary | ICD-10-CM

## 2020-03-24 DIAGNOSIS — C181 Malignant neoplasm of appendix: Secondary | ICD-10-CM

## 2020-03-24 DIAGNOSIS — I454 Nonspecific intraventricular block: Secondary | ICD-10-CM

## 2020-03-24 LAB — COMPREHENSIVE METABOLIC PANEL
Lab: 0.5 mg/dL (ref 0.3–1.2)
Lab: 100 MMOL/L (ref 98–110)
Lab: 135 MMOL/L — ABNORMAL LOW (ref 137–147)
Lab: 2 mg/dL — ABNORMAL HIGH (ref 0.4–1.00)
Lab: 26 mg/dL — ABNORMAL HIGH (ref 7–25)
Lab: 27 MMOL/L (ref 21–30)
Lab: 27 mL/min — ABNORMAL LOW (ref >60)
Lab: 285 U/L — ABNORMAL HIGH (ref 25–110)
Lab: 29 U/L — ABNORMAL HIGH (ref 7–56)
Lab: 3.7 g/dL (ref 3.5–5.0)
Lab: 30 U/L — ABNORMAL HIGH (ref 7–40)
Lab: 4.2 MMOL/L (ref 3.5–5.1)
Lab: 7.9 g/dL — ABNORMAL LOW (ref 6.0–8.0)
Lab: 8 K/UL (ref 3–12)
Lab: 9.8 mg/dL (ref 8.5–10.6)
Lab: 92 mg/dL (ref 70–100)

## 2020-03-24 LAB — CBC AND DIFF
Lab: 12 K/UL — ABNORMAL HIGH (ref 4.5–11.0)
Lab: 3.2 M/UL — ABNORMAL LOW (ref 4.0–5.0)
Lab: 31 % — ABNORMAL LOW (ref 36–45)

## 2020-03-24 LAB — CEA(CARCINOEMBRYONIC AG): Lab: 171 ng/mL — ABNORMAL HIGH (ref <3.0)

## 2020-03-24 LAB — POC CREATININE, RAD: Lab: 2 mg/dL — ABNORMAL HIGH (ref 0.4–1.00)

## 2020-03-24 MED ORDER — IOHEXOL 350 MG IODINE/ML IV SOLN
100 mL | Freq: Once | INTRAVENOUS | 0 refills | Status: AC
Start: 2020-03-24 — End: ?

## 2020-03-24 MED ORDER — HEPARIN, PORCINE (PF) 100 UNIT/ML IV SYRG
500 [IU] | Freq: Once | 0 refills | Status: CP
Start: 2020-03-24 — End: ?

## 2020-03-24 MED ORDER — SODIUM CHLORIDE 0.9 % IJ SOLN
50 mL | Freq: Once | INTRAVENOUS | 0 refills | Status: AC
Start: 2020-03-24 — End: ?

## 2020-04-02 ENCOUNTER — Encounter: Admit: 2020-04-02 | Discharge: 2020-04-02 | Payer: MEDICARE

## 2020-04-06 ENCOUNTER — Encounter: Admit: 2020-04-06 | Discharge: 2020-04-06 | Payer: MEDICARE

## 2020-04-06 DIAGNOSIS — C786 Secondary malignant neoplasm of retroperitoneum and peritoneum: Secondary | ICD-10-CM

## 2020-04-06 DIAGNOSIS — C181 Malignant neoplasm of appendix: Secondary | ICD-10-CM

## 2020-04-29 ENCOUNTER — Encounter

## 2020-04-29 DIAGNOSIS — C181 Malignant neoplasm of appendix: Secondary | ICD-10-CM

## 2020-04-29 DIAGNOSIS — C801 Malignant (primary) neoplasm, unspecified: Secondary | ICD-10-CM

## 2020-04-29 DIAGNOSIS — I454 Nonspecific intraventricular block: Secondary | ICD-10-CM

## 2020-05-18 ENCOUNTER — Encounter: Admit: 2020-05-18 | Discharge: 2020-05-18 | Payer: MEDICARE

## 2020-05-18 ENCOUNTER — Ambulatory Visit: Admit: 2020-05-18 | Discharge: 2020-05-18 | Payer: MEDICARE

## 2020-05-19 ENCOUNTER — Encounter: Admit: 2020-05-19 | Discharge: 2020-05-19 | Payer: MEDICARE

## 2020-05-20 ENCOUNTER — Encounter: Admit: 2020-05-20 | Discharge: 2020-05-20 | Payer: MEDICARE

## 2020-05-20 ENCOUNTER — Ambulatory Visit: Admit: 2020-05-20 | Discharge: 2020-05-20 | Payer: MEDICARE

## 2020-05-21 ENCOUNTER — Encounter: Admit: 2020-05-21 | Discharge: 2020-05-21 | Payer: MEDICARE

## 2020-05-24 ENCOUNTER — Encounter: Admit: 2020-05-24 | Discharge: 2020-05-24 | Payer: MEDICARE

## 2020-05-27 ENCOUNTER — Encounter: Admit: 2020-05-27 | Discharge: 2020-05-27 | Payer: MEDICARE

## 2020-05-31 ENCOUNTER — Encounter: Admit: 2020-05-31 | Discharge: 2020-05-31 | Payer: MEDICARE

## 2020-05-31 DIAGNOSIS — C181 Malignant neoplasm of appendix: Secondary | ICD-10-CM

## 2020-06-02 ENCOUNTER — Encounter: Admit: 2020-06-02 | Discharge: 2020-06-02 | Payer: MEDICARE

## 2020-06-02 NOTE — Telephone Encounter
Spoke with Vicki Buchanan verbalize understanding of appointment change on 06/23/2020 from in person visit to telehealth visit with Sandrea Hughs, PA at 9:00am per patient request.

## 2020-06-23 ENCOUNTER — Encounter: Admit: 2020-06-23 | Discharge: 2020-06-23 | Payer: MEDICARE

## 2020-06-23 DIAGNOSIS — C181 Malignant neoplasm of appendix: Secondary | ICD-10-CM

## 2020-06-23 DIAGNOSIS — I454 Nonspecific intraventricular block: Secondary | ICD-10-CM

## 2020-06-23 DIAGNOSIS — C786 Secondary malignant neoplasm of retroperitoneum and peritoneum: Secondary | ICD-10-CM

## 2020-06-23 DIAGNOSIS — C801 Malignant (primary) neoplasm, unspecified: Secondary | ICD-10-CM

## 2020-06-23 NOTE — Progress Notes
Telehealth Visit Note    Date of Service: 06/23/2020    Subjective:      Obtained patient's verbal consent to treat them and their agreement to [facility] financial policy and NPP via this telehealth visit during the [facility] Emergency       Vicki Buchanan is a 65 y.o. female.    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      She was seen by her PCP on 02/14/2017 with intermittent but recurrent abdominal cramping, bloating and diarrhea. Patient reports 6-7 months ago she began having episodes of abdominal cramping that progressed in severity until it caused her to vomit multiple times until it was dry heaving. This was followed by diarrhea the next morning. Patient continued to have abdominal cramping and bloating with alternating watery diarrhea and constipation.  Upon examination she was noted decreased bowel sounds, mass (multiple firm lesions noted on deep palpation to RLQ, mid upper quadrant, and LLQ, non tender to palpation). She is tender with firm mass noted RUQ, LUQ and LLQ. A CT scan was done on 02/14/2017 that demonstrated large volume of abdominopelvic ascites, omental caking, evidence of pelvic peritoneal  enhancing nodularity suspicious for peritoneal carcinomatosis, and hepatic tumor implantation. Findings are most suspicious  for underling ovarian or gastrointestinal neoplasm.     She was then seen by a General Surgeon and on 02/19/2017 she underwent diagnostic laparoscopy with biopsies of right diaphragm, right liver, right fallopian tube and left pelvis. Pathology showed involvement by mucinous neoplasm with low-grade features in all four biopsy specimens. CA 125 on 02/27/2017 was 90.4 and CEA is 1621.0.    Findings at the time of surgery on 02/19/17 by Dr. Jonah Blue: A total of 1.7 L of fluid was removed and the peritoneal cavity.  It was slightly turbid and hemorrhagic in color.  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  There was bulky disease in the pelvis, scattered non-bulky disease of the right hemidiaphragm and on the capsule liver.  Left hemidiaphragm appear to be spared.  On the parietal peritoneum it was started with areas suspicious for carcinomatosis.  The stomach was observed.  There were no evident masses that could be discerned externally.  There was bulky disease in the pelvis over the bladder and a large, what appeared to be, right tubo-ovarian mass with heavy disposition of tumor in that area.  She had also an area of the right fimbria of the fallopian tube with a large to positive tumor.  A portion of the right fallopian tube and fimbria were removed with ligature device.  Biopsies left pelvis were taken of the left pelvic sidewall.  Biopsies were also taken of the serosal surface of the liver and the right hemidiaphragm.     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 This was low grade mucinous neoplasm which was focally positive for CK7, and negative for PAX-8. Cytology from abdominal fluid was negative for malignancy.     She was taken to the OR on 04/03/2017 for potential cytoreductive surgery and hyperthermic intraperitoneal chemotherapy (CRS and HIPEC). 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  The process involved the entire mesentery of the small and large bowel as well as the root of the mesentery.  Almost all of the peritoneal surfaces had disease.  Unfortunately, the mesenteric disease was infiltrative  to an extent where it would have required too much of bowel resection in order to remove the disease. A diverting loop ileostomy was performed to help alleviate obstructive symptoms. Pathology described mucinous carcinoma with low-grade morphology and occasional high-grade areas (G2 and focal G3 with focal signet ring cells).    She received FOLFOX + Avastin since February-September 2019 under the direction of Dr. Josiah Lobo. She began a clinical trial in Sept 2019 with SIROLIMUS + EPACADOSTAT.    She stopped the study drug on 05/21/18 as she chose to enroll in the Mitomycin HIPEC trial at [facility] and completed her first laparoscopic HIPEC on 05/28/18 and second laparoscopic HIPEC on 06/25/18.     She continued to have significant pelvic discomfort and urinary frequency due to the large pelvic mass. She sought an opinion from Dr. [name] in [place], [place]. On 09/12/2018, she underwent an exp Lap, cytoreductive debulking surgery for metastatic mucinous adenocarcinoma of the appendix, total colectomy, splenectomy, omentectomy, TAH/BSO and HIPEC with Mitomycin-C. She underwent a CC-3 cytoreduction with significant amount of visible and palpable disease remaining.    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    Vicki Buchanan presents today for surveillance with our office. She has recently completed radiation to the right lung and feels that she is recovering well. She had some fatigue that persisted throughout radiation. They have been able to travel [place] and [place] to visit and vacation with family. She reports that the tumor that has protruded around the stoma has enlarged and is now encircling about 3/4 of the circumference of the stoma. Ostomy bags are difficulty to fit and she has had more leaks because of this. The function of the ostomy has been consistent and does not seem to be impacted by this external tumor. She is scheduled for repeat imaging in May 2022 and has plans for another debulking surgery with Dr. Abbe Amsterdam.    She reports difficulty with the left chest port-a-cath. She has had the same port for about 3 years and problems only began recently. She has been able to receive IV fluids through the port but cannot seem to get sufficient blood return for labs, which has been drawn from a peripheral IV. She is ok to continue with this approach for now.       Review of Systems   Constitutional: Positive for appetite change (improved) and fatigue (stable). Negative for activity change, fever and unexpected weight change.   HENT: Negative for mouth sores, sore throat and trouble swallowing.    Eyes: Negative for visual disturbance.   Respiratory: Negative for cough and shortness of breath.    Cardiovascular: Negative for chest pain.   Gastrointestinal: Positive for abdominal distention. Negative for blood in stool, constipation, diarrhea, nausea and vomiting.   Genitourinary: Negative for difficulty urinating and hematuria.   Musculoskeletal: Negative for arthralgias and back pain.   Skin: Negative for color change, pallor, rash and wound.   Neurological: Positive for numbness (upper thighs). Negative for dizziness, light-headedness and headaches.   Hematological: Does not bruise/bleed easily.   Psychiatric/Behavioral: Negative for agitation. The patient is nervous/anxious.          Objective:         ? albuterol sulfate (PROAIR HFA) 90 mcg/actuation aerosol inhaler Inhale 2 puffs by mouth into the lungs every 6 hours as needed for Wheezing or Shortness of Breath. Shake well before use.   ?  amoxicillin (AMOXIL) 875 mg tablet Take 875 mg by mouth daily.   ? Cetirizine (ZYRTEC) 10 mg cap Take 10 mg by mouth as Needed.   ? cholestyramine-sucrose (QUESTRAN) 4 gram packet MIX AND DRINK 1 PACKET BY MOUTH THREE TIMES DAILY WITH MEALS   ? enoxaparin (LOVENOX) 40 mg injection syringe Inject 0.4 mL under the skin daily.   ? famotidine (PEPCID AC) 10 mg tablet Take one tablet by mouth twice daily.   ? gabapentin (NEURONTIN) 100 mg capsule TAKE 1 TO 2 CAPSULES BY MOUTH TWICE DAILY FOR SHINGLES PAIN   ? MULTIVITAMIN PO Take 1 tablet by mouth daily.   ? ondansetron (ZOFRAN ODT) 8 mg rapid dissolve tablet Dissolve 8 mg by mouth every 8 hours as needed for Nausea or Vomiting. Place on tongue to disolve.   ? psyllium husk (METAMUCIL) 0.4 gram cap    ? sodium chloride 0.9% (NS) 0.9 % 1,000 mL with potassium chloride 2 mEq/mL 20 mEq, magnesium sulfate 4 mEq/mL (50 %) 2 g IV infusion Administer 1L NS with K and 2g Mg once weekly via outpatient infusion.          Telehealth Patient Reported Vitals     Row Name 06/23/20 0851                Pain Score Zero                  Computed Telehealth Body Mass Index unavailable. Necessary lab results were not found in the last year.    Physical Exam  Vitals reviewed.   Constitutional:       Appearance: She is well-developed.   HENT:      Head: Normocephalic and atraumatic.   Neurological:      Mental Status: She is alert and oriented to person, place, and time.   Psychiatric:         Behavior: Behavior normal.         Thought Content: Thought content normal.         Judgment: Judgment normal.              Assessment and Plan:  Appendiceal adenocarcinoma  Peritoneal carcinomatosis  - Recommend using a wound manager device rather than an ostomy bag to accommodate the size of the tumor around the stoma. We will reach out to [facility] to see which brands of wound managers are available.  - Discussed port malfunction. Offerred to schedule for replacement port if she would like but she is fine to continue as is for now and will let us know if she would like to have the port replaced.  - RTC 6 months for continuity of care.    Lilian Kapur, PA-C    Collaborating physician: Loralie Champagne, MD                         25 minutes spent on this patient's encounter with counseling and coordination of care taking >50% of the visit.

## 2020-06-23 NOTE — Telephone Encounter
Called patient and scheduled 6 month follow up appointment with Dr. Alvan Dame at her preference. Patient will review MyChart for details.

## 2020-06-28 ENCOUNTER — Encounter: Admit: 2020-06-28 | Discharge: 2020-06-28 | Payer: MEDICARE

## 2020-07-31 ENCOUNTER — Encounter: Admit: 2020-07-31 | Discharge: 2020-07-31 | Payer: MEDICARE

## 2020-08-10 ENCOUNTER — Encounter: Admit: 2020-08-10 | Discharge: 2020-08-10 | Payer: MEDICARE

## 2020-08-12 ENCOUNTER — Encounter: Admit: 2020-08-12 | Discharge: 2020-08-12 | Payer: MEDICARE

## 2020-08-17 ENCOUNTER — Encounter: Admit: 2020-08-17 | Discharge: 2020-08-17 | Payer: MEDICARE

## 2020-08-17 DIAGNOSIS — C181 Malignant neoplasm of appendix: Secondary | ICD-10-CM

## 2020-08-17 LAB — COMPREHENSIVE METABOLIC PANEL
CO2: 29 MMOL/L — ABNORMAL HIGH (ref 21–30)
GLUCOSE,PANEL: 111 mg/dL — ABNORMAL HIGH (ref 70–100)
POTASSIUM: 4.6 MMOL/L (ref 3.5–5.1)
SODIUM: 138 MMOL/L (ref 137–147)

## 2020-08-17 LAB — CBC AND DIFF
ABSOLUTE BASO COUNT: 0.1 K/UL (ref 0–0.20)
ABSOLUTE EOS COUNT: 0.4 K/UL (ref 0–0.45)
ABSOLUTE LYMPH COUNT: 2.5 K/UL (ref 1.0–4.8)
ABSOLUTE MONO COUNT: 1.6 K/UL — ABNORMAL HIGH (ref 0–0.80)
ABSOLUTE NEUTROPHIL: 8.9 K/UL — ABNORMAL HIGH (ref 1.8–7.0)
BASOPHILS %: 1 % (ref 0–2)
EOSINOPHILS %: 3 % (ref 0–5)
HEMATOCRIT: 27 % — ABNORMAL LOW (ref 36–45)
HEMOGLOBIN: 9.1 g/dL — ABNORMAL LOW (ref 12.0–15.0)
LYMPHOCYTES %: 18 % — ABNORMAL LOW (ref >60)
MCH: 33 pg — ABNORMAL LOW (ref 26–34)
MCHC: 33 g/dL — ABNORMAL HIGH (ref 32.0–36.0)
MCV: 99 FL (ref 80–100)
MONOCYTES %: 12 % (ref 4–12)
MPV: 6.8 FL — ABNORMAL LOW (ref 7–11)
NEUTROPHILS %: 66 % (ref 41–77)
RBC COUNT: 2.7 M/UL — ABNORMAL LOW (ref 4.0–5.0)
RDW: 12 % (ref 11–15)
WBC COUNT: 13 K/UL — ABNORMAL HIGH (ref 4.5–11.0)

## 2020-08-17 LAB — CEA(CARCINOEMBRYONIC AG): CEA: 140 ng/mL — ABNORMAL HIGH (ref <3.0)

## 2020-08-17 MED ORDER — HEPARIN, PORCINE (PF) 100 UNIT/ML IV SYRG
500 [IU] | Freq: Once | 0 refills | Status: CP
Start: 2020-08-17 — End: ?

## 2020-08-17 NOTE — Progress Notes
pt to treatment with PAC already accessed. Labs drawn as ordered without incidence. PAC flushed and deaccessed per protocol without concern. Pt left lab in stable condition, no further needs at this time.

## 2020-08-18 ENCOUNTER — Encounter: Admit: 2020-08-18 | Discharge: 2020-08-18 | Payer: MEDICARE

## 2020-08-18 DIAGNOSIS — I454 Nonspecific intraventricular block: Secondary | ICD-10-CM

## 2020-08-18 DIAGNOSIS — C801 Malignant (primary) neoplasm, unspecified: Secondary | ICD-10-CM

## 2020-08-18 DIAGNOSIS — C181 Malignant neoplasm of appendix: Secondary | ICD-10-CM

## 2020-08-19 ENCOUNTER — Ambulatory Visit: Admit: 2020-08-19 | Discharge: 2020-08-19 | Payer: MEDICARE

## 2020-09-09 ENCOUNTER — Encounter: Admit: 2020-09-09 | Discharge: 2020-09-09 | Payer: MEDICARE

## 2020-09-09 NOTE — Progress Notes
Faxed progress note and approval for skin barriers to [facility] healthcare at [phone].

## 2020-09-10 ENCOUNTER — Encounter: Admit: 2020-09-10 | Discharge: 2020-09-10 | Payer: MEDICARE

## 2020-09-15 ENCOUNTER — Encounter: Admit: 2020-09-15 | Discharge: 2020-09-15 | Payer: MEDICARE

## 2020-09-15 NOTE — Progress Notes
Spoke with Lattie Haw with Dr [facility] Going in [place].. She informs [place] that pt may require TPN After her surgery. Asking if Dr Eldred Manges team will be available for directing this. Msg. Sent to [place] team to confirm and will send to SW and dietician as well.

## 2020-09-15 NOTE — Progress Notes
Received call from Navigator in [facility] in [place]  with Dr. Ulyess Blossom.  Called her back to discuss the information she wanted [place] to know about upcoming procedure.  Left msg with our #.

## 2020-09-28 ENCOUNTER — Encounter: Admit: 2020-09-28 | Discharge: 2020-09-28 | Payer: MEDICARE

## 2020-09-29 ENCOUNTER — Ambulatory Visit: Admit: 2020-09-29 | Discharge: 2020-09-30 | Payer: MEDICARE

## 2020-09-29 ENCOUNTER — Encounter: Admit: 2020-09-29 | Discharge: 2020-09-29 | Payer: MEDICARE

## 2020-09-29 DIAGNOSIS — I454 Nonspecific intraventricular block: Secondary | ICD-10-CM

## 2020-09-29 DIAGNOSIS — C181 Malignant neoplasm of appendix: Secondary | ICD-10-CM

## 2020-09-29 DIAGNOSIS — C801 Malignant (primary) neoplasm, unspecified: Secondary | ICD-10-CM

## 2020-10-11 ENCOUNTER — Encounter: Admit: 2020-10-11 | Discharge: 2020-10-11 | Payer: MEDICARE

## 2020-10-11 NOTE — Telephone Encounter
Rec'd call from [place] with [facility] center following up on surgery clearance. States pt. Is scheduled for surgery on this Thursday and is needing letter for clearance. RN reviewed chart. States pt. Had labs completed today (CBC, CMP, CEA level) at [facility]. States will see if lab can add on Phos and Mag which Dr. Johnn Hai wants completed. Informed once has full lab results will be able to discuss with Dr. Johnn Hai and provide that approval if appropriate. Fax number [phone]

## 2020-10-11 NOTE — Telephone Encounter
Updates in care everywhere-  7/25 Cr 2.09,  GFR 29, Hgb 10.6, WBC 14.26.

## 2020-11-15 ENCOUNTER — Encounter: Admit: 2020-11-15 | Discharge: 2020-11-15 | Payer: MEDICARE

## 2020-11-18 ENCOUNTER — Encounter: Admit: 2020-11-18 | Discharge: 2020-11-18 | Payer: MEDICARE

## 2020-11-23 ENCOUNTER — Encounter: Admit: 2020-11-23 | Discharge: 2020-11-23 | Payer: MEDICARE

## 2020-11-23 NOTE — Telephone Encounter
Nicole Kindred PA from [facility] in [place] called to relay their TB recommendations for patient.  Recommendations to proceed with folfiri/avastin with holding avastin 6-8 post op. They also request if our office can repeat genomic testing.  Office to fax official records.

## 2020-11-26 ENCOUNTER — Encounter: Admit: 2020-11-26 | Discharge: 2020-11-26 | Payer: MEDICARE

## 2020-11-26 DIAGNOSIS — I454 Nonspecific intraventricular block: Secondary | ICD-10-CM

## 2020-11-26 DIAGNOSIS — C801 Malignant (primary) neoplasm, unspecified: Secondary | ICD-10-CM

## 2020-11-26 DIAGNOSIS — C181 Malignant neoplasm of appendix: Secondary | ICD-10-CM

## 2020-11-26 DIAGNOSIS — C786 Secondary malignant neoplasm of retroperitoneum and peritoneum: Secondary | ICD-10-CM

## 2020-11-26 MED ORDER — DEXAMETHASONE 6 MG PO TAB
12 mg | Freq: Once | ORAL | 0 refills
Start: 2020-11-26 — End: ?

## 2020-11-26 MED ORDER — FLUOROURACIL IV AMB PUMP
2400 mg/m2 | Freq: Once | INTRAVENOUS | 0 refills
Start: 2020-11-26 — End: ?

## 2020-11-26 MED ORDER — PALONOSETRON 0.25 MG/5 ML IV SOLN
.25 mg | Freq: Once | INTRAVENOUS | 0 refills
Start: 2020-11-26 — End: ?

## 2020-11-26 MED ORDER — ATROPINE 0.4 MG/ML IJ SOLN
0.4 mg | INTRAVENOUS | 0 refills | PRN
Start: 2020-11-26 — End: ?

## 2020-11-26 NOTE — Progress Notes
Date of Service: 11/26/2020      Subjective:             Reason for Visit:  No chief complaint on file.      Vicki Buchanan is a 65 y.o. 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 She was discharged on 10/22/20 with IVF MWF at infusion center.     Now she is back in [place]. She lives in [place].       Past Medical / Surgical / Family / Social Histories : Reviewed and unchanged from previous clinic notes.         Review of Systems   Constitutional: Positive for fatigue (stable). Negative for activity change, appetite change (improved), fever and unexpected weight change.   HENT: Negative for mouth sores, sore throat and trouble swallowing.    Eyes: Negative for visual disturbance.   Respiratory: Negative for cough and shortness of breath.    Cardiovascular: Negative for chest pain.   Gastrointestinal: Negative for abdominal distention, blood in stool, constipation, diarrhea, nausea and vomiting.   Genitourinary: Negative for difficulty urinating and hematuria.   Musculoskeletal: Negative for arthralgias and back pain.   Skin: Negative for color change, pallor, rash and wound.   Neurological: Positive for numbness (upper thighs). Negative for dizziness, light-headedness and headaches.   Hematological: Does not bruise/bleed easily.   Psychiatric/Behavioral: Negative for agitation. The patient is nervous/anxious.        Medical History:   Diagnosis Date   ? Bundle branch block    ? Cancer [facility])    ? 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 CESAREAN SECTION  1981   ? HX CHOLECYSTECTOMY  1995   ? LAPAROSCOPY  02/2017    biopsy peritoneal   ? COLONOSCOPY DIAGNOSTIC WITH SPECIMEN COLLECTION BY BRUSHING/ WASHING - FLEXIBLE N/A 03/19/2017    Performed by Everardo All, MD at [facility] ENDO   ? COLONOSCOPY WITH BIOPSY - FLEXIBLE  03/19/2017    Performed by Everardo All, MD at [facility] ENDO   ? EXPLORATORY LAPAROTOMY, DIVERTING LOOP ILEOSTOMY N/A 04/03/2017    Performed by Loralie Champagne, MD at [facility] OR   ? INSERTION TUNNELED CENTRAL VENOUS CATHETER - AGE 23 YEARS AND OVER Left 04/23/2017    Performed by Loralie Champagne, MD at [facility] OR   ? CYSTOURETHROSCOPY WITH INDWELLING URETERAL STENT INSERTION Bilateral 04/09/2018    Performed by Earl Many, MD at [facility] OR   ? CYSTOURETHROSCOPY WITH URETERAL CATHETERIZATION WITH/ WITHOUT IRRIGATION/ INSTILLATION/ URETEROPYELOGRAPHY Bilateral 04/09/2018    Performed by Earl Many, MD at [facility] OR   ? CYSTOURETHROSCOPY WITH INDWELLING URETERAL STENT EXCHANGE (RIGHT 6 x 26cm / LEFT 6 x 28cm) Bilateral 08/08/2018    Performed by Earl Many, MD at [facility] OR   ? RETROGRADE UROGRAPHY WITH/ WITHOUT KUB Bilateral 08/08/2018    Performed by Earl Many, MD at [facility] OR   ? ILEOSTOMY OR JEJUNOSTOMY Right    ? TUNNELED VENOUS PORT PLACEMENT Left    ? URETER STENT PLACEMENT Bilateral  Family History   Problem Relation Age of Onset   ? Diabetes Mother    ? Cancer Father    ? Diabetes Brother    ? Cancer-Lung Paternal Aunt    ? Cancer-Breast Maternal Grandmother    ? Diabetes Paternal Grandmother      Social History     Socioeconomic History   ? Marital status: Married   Tobacco Use   ? Smoking status: Never Smoker   ? Smokeless tobacco: Never Used   Vaping Use   ? Vaping Use: Never used   Substance and Sexual Activity   ? Alcohol use: No     Comment: rarely   ? Drug use: No         Objective:         ? Cetirizine (ZYRTEC) 10 mg cap Take 10 mg by mouth as Needed.   ? famotidine (PEPCID AC) 10 mg tablet Take one tablet by mouth twice daily.   ? magnesium citrate 200 mg tab Take 1 tablet by mouth daily.   ? MULTIVITAMIN PO Take 1 tablet by mouth daily.   ? ondansetron (ZOFRAN ODT) 8 mg rapid dissolve tablet Dissolve 8 mg by mouth every 8 hours as needed for Nausea or Vomiting. Place on tongue to disolve.   ? psyllium husk (METAMUCIL) 0.4 gram cap    ? sodium chloride 0.9% (NS) 0.9 % 1,000 mL with potassium chloride 2 mEq/mL 20 mEq, magnesium sulfate 4 mEq/mL (50 %) 2 g IV infusion Administer 1L NS with K and 2g Mg once weekly via outpatient infusion.     Vitals:    11/26/20 0855   BP: 111/81   BP Source: Arm, Right Upper   Pulse: 74   Temp: 36.7 ?C (98 ?F)   Resp: 16   SpO2: 100%   TempSrc: Temporal   PainSc: Zero   Weight: 52.1 kg (114 lb 12.8 oz)   Height: 157 cm (5' 1.81)     Body mass index is 21.13 kg/m?Marland Kitchen     Pain Score: Zero         Pain Addressed:  N/A    Patient Evaluated for a Clinical Trial: Patient currently enrolled in a [place] treatment clinical trial.     [place]astern Cooperative Oncology Group performance status is 1, Restricted in physically strenuous activity but ambulatory and able to carry out work of a light or sedentary nature, e.g., light house work, office work.     Physical Exam       CBC w/DIFF  CBC with Diff Latest Ref Rng & Units 08/17/2020 03/24/2020 09/24/2019 09/05/2019 06/17/2019   WBC 4.5 - 11.0 K/UL 13.5(H) 12.1(H) 13.0(H) 10.5 10.7   RBC 4.0 - 5.0 M/UL 2.75(L) 3.20(L) 2.94(L) 3.49(L) 2.99(L)   HGB 12.0 - 15.0 GM/DL 4.6(N) 10.5(L) 10.1(L) 11.6(L) 10.2(L)   HCT 36 - 45 % 27.4(L) 31.7(L) 29.8(L) 37.6 29.8(L)   MCV 80 - 100 FL 99.9 99.3 101.4(H) 108(H) 99.6   MCH 26 - 34 PG 33.2 32.8 34.4(H) 33.3(H) 34.1(H)   MCHC 32.0 - 36.0 G/DL 62.9 52.8 41.3 24.4 01.0   RDW 11 - 15 % 12.9 11.3 13.1 12.8 12.3   PLT 150 - 400 K/UL 629(H) 474(H) 557(H) 470(H) 548(H)   MPV 7 - 11 FL 6.8(L) 7.6 6.8(L) - 7.0   NEUT 41 - 77 % 66 61 70 - 62   ANC 1.8 - 7.0 K/UL 8.90(H) 7.40(H) 9.00(H) - 6.70   LYMA 24 - 44 % 18(L)  23(L) 16(L) - 21(L)   ALYM 1.0 - 4.8 K/UL 2.50 2.80 2.10 - 2.30   MONA 4 - 12 % 12 12 11  - 12   AMONO 0 - 0.80 K/UL 1.60(H) 1.40(H) 1.40(H) - 1.30(H)   EOSA 0 - 5 % 3 3 3  - 4   AEOS 0 - 0.45 K/UL 0.40 0.40 0.40 - 0.50(H)   BASA 0 - 2 % 1 1 0 - 1   ABAS 0 - 0.20 K/UL 0.10 0.10 0.10 - 0.10     Comprehensive Metabolic Profile  CMP Latest Ref Rng & Units 08/17/2020 03/24/2020 09/24/2019 09/05/2019 09/03/2019   NA 137 - 147 MMOL/L 138 135(L) 137 139 135(L)   K 3.5 - 5.1 MMOL/L 4.6 4.2 4.6 4.9 4.6   CL 98 - 110 MMOL/L 103 100 102 10.5 98   CO2 21 - 30 MMOL/L 29 27 27 25 25    GAP 3 - 12 6 8 8  - -   BUN 7 - 25 MG/DL 16(X) 09(U) 04(V) 40(J) 59(H)   CR 0.4 - 1.00 MG/DL 8.11(B) 1.47(W) 2.95(A) 2.21(H) 3.42(H)   GLUX 70 - 100 MG/DL 213(Y) 92 865(H) 86 846   CA 8.5 - 10.6 MG/DL 8.9 9.8 9.4 96.2 95.2   TP 6.0 - 8.0 G/DL 7.6 7.9 7.8 - -   ALB 3.5 - 5.0 G/DL 3.3(L) 3.7 3.7 - -   ALKP 25 - 110 U/L 254(H) 285(H) 328(H) - -   ALT 7 - 56 U/L 33 29 35 - -   TBILI 0.3 - 1.2 MG/DL 0.4 0.5 0.4 - -   GFR >84 mL/min - - 23(L) 23.8(L) 14.4(L)   GFRAA >60 mL/min - - 28(L) - -       Tumor Markers  Lab Results   Component Value Date    CEA 1,403.1 (H) 08/17/2020    CEA 171.8 (H) 03/24/2020    CEA 1,882.9 (H) 09/24/2019    CEA 672.0 (H) 06/17/2019    CEA 665.4 (H) 02/28/2019    CEA 226.4 (H) 11/13/2018    CEA 3,307.8 (H) 07/17/2018    CEA 4,030.3 (H) 07/10/2018    CEA 2,575.3 (H) 06/10/2018    CEA 2,333.9 (H) 05/13/2018    CA199 <1 06/25/2017    CA199 <1 06/11/2017    CA199 <1 05/28/2017    CA199 <1 05/14/2017    CA125 41 (H) 01/21/2018    CA125 42 (H) 11/21/2017    CA125 46 (H) 11/05/2017    CA125 41 (H) 10/22/2017    CA125 34 10/08/2017    CA125 34 09/24/2017    CA125 31 09/10/2017    CA125 29 08/27/2017    CA125 34 08/15/2017    CA125 39 (H) 07/30/2017        Assessment and Plan:  Metastatic appendiceal carcinoma. 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   -CT scan 10/31/17 progressive disease. 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 S/p C15 FOX + Avastin on 11/21/17. Repeat CT 12/14/17 with Stable mucinous implant along the posterior mediastinum,  And pseudomyxoma peritonei.  She agreed to?immunotherapy approaches with Epacadostat?+ Sirolimus?trial  - 12/24/2017 - 05/21/2018, She was on epicadostat/sirolimus trial. She stopped study drug on 05/21/18 as she decided to enroll in Mitomycin HIPEC trial at [facility].  On 05/28/2018 patient underwent diagnostic laparoscopy, evacuation of mucinous ascites 1.5 L, peritoneal biopsy, laparoscopic HIPEC with mitomycin-C in [place] by Dr. Daryl Eastern.  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  Completed second laparoscopic HIPEC on June 25, 2018. Again 1.5L removed.  -On 09/12/18 She underwent debulking at [facility] in [place], [place]. Removed 98% by Dr. [name]. Lost almost ~50 lbs. She was hospitalized for 18 days. Discharged on 09/30/18. She had L. pleural effusion requiring chest tube drainage. She has intra-abd-abscess in Aug, she has drain in place, discharged on 11/04/18. S/p (Cefipeme and Diflucan) on 11/19/18 via Dr. Nathanial Millman. Her ureteral stents were removed in Aug 2020. Also her ostomy was relocated  She was hospitalized in Nov-2020 for AKI 2/2 dehydration and likely PE (V/Q scan and Top elevation). Improved with Lovenox and IVF and Mag.  CEA increasing again. Repeat CT c/a/p 03/20/19 with stable to slight increase in cancer and RUL nodule. Discussed observation vs treatment. She chose observation and is considering repeat CRS + HIPEC via Dr. [name] in [place].  - CT scans from 09/24/2019 of the abdomen, without contrast, appear stable. The peritoneal thickening, carcinomatosis, retroperitoneal lymph nodes, a small amount of fluid collection or trace ascites, appear to be stable compared to 3 months ago. There is a lot of calcification.   - s/p RT to rt lung met on 05/21/20    Plan 11/26/20   - CT scan of the chest, abdomen, and pelvis performed on 08/17/2020 shows a right lung mass that is slightly increased from 11 mm to 14 mm. There are some new lung nodules that have developed, a small 1 in the left lung, a new nodule in the right lung. Inside the abdomen we are seeing continued increase in size of the peritoneal nodules. The largest measuring 14 mm compared to 12 mm. The nodule lower in the pelvis increased from 16 mm to 21 mm.   - [facility] testing in 2019 showed KRAS G12D mutation, GNAS mutation, and SMAD2 mutation. TMB = 1.3 .   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 She was discharged on 10/22/20 with IVF MWF at infusion center.   - Repeat CT c/a/p for a new baseline  [name] PA from [facility] cancer center in [place] called to relay their TB recommendations for patient.  Recommendations to proceed with folfiri/avastin with holding avastin 6-8 post op. They also request if our office can repeat genomic testing.   - Repeat TEMPUS from 10/14/20   - Pre-screen for KRAS G12D inhibitor trial, if not eligible will plan for FOLFIRI +/- Bevacizumab in 2-3 weeks after teach w/ Edson Snowball, NP as a palliative aprpoach.      Neuropathy, from oxaliplatin  - Continue to monitor  - Stable    Elevated creatinine, S/P ureteral stent. Her ureteral stents were removed in Aug 2020. Cr. 1.79. Encouraged hydration.  S/p IVF 1L NS and 1g Mag weekly x 4 weeks.   Refer to Nephrology as Cr is improving with IVF.  --If her creatinine and kidney function continue to improve, she will not need to proceed with intravenous fluids. She will hold intravenous fluids for 1 week and repeat blood work. She will follow up with Dr. Marjie Skiff in nephrology.  - Creatinine 1.53 in Aug, Cr is > 3 (on Abx for UTI) in [place] this week via Dr Alona Bene , anticipate this to improve over 2 weeks.  - Following-up with  Dr. Marjie Skiff    Fatigue: this is secondary to underlying malignancy as well as treatment. This is grade 1.  --Improving.    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 We will continue to monitor this.   --Stable    Anemia. From cancer.    - Hb improved. Continue to monitor.      Shortness of breath  --I explained that we are unsure if she will need to continue Lovenox injections as her CT scans were preformed without contrast. She will hold the Lovenox as she completed 4 months of treatment. If her shortness of breath symptoms worsen, she may have to restart the Lovenox injections. We may consider having her CT scans performed with contrast if her kidney function continues to improve.     Increased ostomy output  --She will continue to take Metamucil as she has seen improvement. She may begin taking Imodium or Lomotil if she begins to notice frequency of emptying the ostomy bag. If her symptoms worsen, we may consider Octreotide injection.  - Normal  - Her ostomy is high  - Continue current bowel regimen    Urinary tract infection  - now on Abx, she will be following up with her physician at [facility] medical office and see if she needs a repeat urine test.     The patient/family was/were allowed to ask questions and voice concerns; these were addressed to the best of our ability. Patient/family expressed understanding of what was explained and agreed with the present plan. Patient/family has the phone numbers for the Cancer Center and was instructed on how to contact us with any questions or concerns.     Vanita Ingles MD

## 2020-12-09 ENCOUNTER — Encounter: Admit: 2020-12-09 | Discharge: 2020-12-09 | Payer: MEDICARE

## 2020-12-09 ENCOUNTER — Ambulatory Visit: Admit: 2020-12-09 | Discharge: 2020-12-09 | Payer: MEDICARE

## 2020-12-09 DIAGNOSIS — C181 Malignant neoplasm of appendix: Secondary | ICD-10-CM

## 2020-12-09 LAB — POC CREATININE, RAD: CREATININE, POC: 2 mg/dL — ABNORMAL HIGH (ref 0.4–1.00)

## 2020-12-09 MED ORDER — HEPARIN, PORCINE (PF) 100 UNIT/ML IV SYRG
500 [IU] | Freq: Once | 0 refills | Status: CP
Start: 2020-12-09 — End: ?

## 2020-12-15 ENCOUNTER — Encounter: Admit: 2020-12-15 | Discharge: 2020-12-15 | Payer: MEDICARE

## 2020-12-15 DIAGNOSIS — C181 Malignant neoplasm of appendix: Secondary | ICD-10-CM

## 2020-12-15 DIAGNOSIS — I454 Nonspecific intraventricular block: Secondary | ICD-10-CM

## 2020-12-15 DIAGNOSIS — C786 Secondary malignant neoplasm of retroperitoneum and peritoneum: Secondary | ICD-10-CM

## 2020-12-15 DIAGNOSIS — C801 Malignant (primary) neoplasm, unspecified: Secondary | ICD-10-CM

## 2020-12-15 MED ORDER — IRINOTECAN IVPB
180 mg/m2 | Freq: Once | INTRAVENOUS | 0 refills | Status: CP
Start: 2020-12-15 — End: ?
  Administered 2020-12-15 (×3): 271.8 mg via INTRAVENOUS

## 2020-12-15 MED ORDER — SODIUM CHLORIDE 0.9 % IV SOLP
1000 mL | Freq: Once | INTRAVENOUS | 0 refills | Status: CP
Start: 2020-12-15 — End: ?
  Administered 2020-12-15: 17:00:00 1000 mL via INTRAVENOUS

## 2020-12-15 MED ORDER — PROCHLORPERAZINE MALEATE 10 MG PO TAB
ORAL_TABLET | Freq: Four times a day (QID) | 3 refills | Status: AC | PRN
Start: 2020-12-15 — End: ?

## 2020-12-15 MED ORDER — FLUOROURACIL IV AMB PUMP
2400 mg/m2 | Freq: Once | INTRAVENOUS | 0 refills | Status: CP
Start: 2020-12-15 — End: ?
  Administered 2020-12-15 (×4): 3624 mg via INTRAVENOUS

## 2020-12-15 MED ORDER — DEXAMETHASONE 4 MG PO TAB
ORAL_TABLET | INTRAMUSCULAR | 3 refills | 14.00000 days | Status: AC
Start: 2020-12-15 — End: ?

## 2020-12-15 MED ORDER — ONDANSETRON HCL 8 MG PO TAB
8 mg | ORAL_TABLET | ORAL | 11 refills | 8.00000 days | Status: AC | PRN
Start: 2020-12-15 — End: ?

## 2020-12-15 MED ORDER — PALONOSETRON 0.25 MG/5 ML IV SOLN
.25 mg | Freq: Once | INTRAVENOUS | 0 refills | Status: CP
Start: 2020-12-15 — End: ?
  Administered 2020-12-15: 17:00:00 0.25 mg via INTRAVENOUS

## 2020-12-15 MED ORDER — LOPERAMIDE 1 MG/7.5 ML PO LIQD
2 mg | Freq: Four times a day (QID) | ORAL | 11 refills | 10.00000 days | Status: AC | PRN
Start: 2020-12-15 — End: ?

## 2020-12-15 MED ORDER — DEXAMETHASONE 6 MG PO TAB
12 mg | Freq: Once | ORAL | 0 refills | Status: CP
Start: 2020-12-15 — End: ?
  Administered 2020-12-15: 17:00:00 12 mg via ORAL

## 2020-12-15 MED ORDER — ALTEPLASE 2 MG IK SOLR
2 mg | Freq: Once | INTRAMUSCULAR | 0 refills | Status: CP
Start: 2020-12-15 — End: ?
  Administered 2020-12-15: 15:00:00 2 mg via INTRAMUSCULAR

## 2020-12-15 MED ORDER — ATROPINE 0.4 MG/ML IJ SOLN
0.4 mg | INTRAVENOUS | 0 refills | Status: AC | PRN
Start: 2020-12-15 — End: ?
  Administered 2020-12-15: 18:00:00 0.4 mg via INTRAVENOUS

## 2020-12-15 MED ORDER — LORAZEPAM 0.5 MG PO TAB
ORAL_TABLET | ORAL | 3 refills | 12.00000 days | Status: AC | PRN
Start: 2020-12-15 — End: ?

## 2020-12-15 NOTE — Progress Notes
Chemotherapy Education    Provided written and verbal education regarding FOLFIRI chemotherapy (5-fluorouracil + leucovorin + irinotecan).    Reviewed chemotherapy schedule. Patient will receive chemotherapy in 14-day cycles as follows:  ? Irinotecan as 90-minute infusion on Day 1  ? Leucovorin as 90-minute infusion on Day 1   ? 5-fluorouracil bolus over 2-4 minutes and continuous infusion over 46 hours (Days 1 & 2)     Potential adverse effects include (but are not limited to):  ? Low blood counts (explained associated risk for infection, bleeding, bruising, fatigue)  ? Nausea and vomiting (explained purpose of scheduled antiemetics before chemotherapy and the use of PRNs in the event of breakthrough CINV)   ? Appetite changes  ? Bowel changes, particularly diarrhea (advised patient to not take any OTC medications without first consulting with the health care team; discussed the difference between acute and chronic diarrhea)  ? Mouth sores   ? Dermatologic effects such as skin darkening or potential for hand and foot syndrome related to fluorouracil (skin rash, swelling, redness, pain and/or peeling of the skin on the palms of hands and soles of feet - advised patient to maintain open lines of communication with providers so that dose adjustments or interventions can be made if warranted)   ? Laboratory abnormalities (advised patient that the health care team will be monitoring these values closely)     While the risk for infusion or hypersensitivity reactions is rare, cautioned patient about this potential and advised patient to report immediately any swelling, burning, pain, or redness at the infusion site, any trouble breathing, or any chest pain.    Advised patient to contact clinic for any of the following scenarios or if any other questions/concerns arise:  ? Persistent uncontrolled nausea/vomiting uncontrolled by PRN antiemetics  ? Extreme fatigue  ? Inability to self-hydrate or eat for >24 hours  ? Other GI issues unresponsive to current medications  ? Uncontrolled pain    Educated patient to not start any new medications, vitamins, or herbals prior to contacting clinic.    Patient voiced understanding about the provided information. All questions/concerns addressed at this time. Medication handout(s) provided.

## 2020-12-15 NOTE — Progress Notes
CHEMO NOTE  Verified chemo consent signed and in chart.    Blood return positive via: Port (Single)    BSA and dose double checked (agree with orders as written) with: yes     Labs/applicable tests checked: CBC and Comprehensive Metabolic Panel (CMP)    Chemo regimen: Drug/cycle/day C1D1    irinotecan (CAMPTOSAR) 271.8 mg in dextrose 5% (D5W) 513.59 mL IVPB     fluorouraciL (ADRUCIL) 3,624 mg in sodium chloride 0.9% (NS) 92 mL IV amb pump        Rate verified and armband double check with second RN: yes    Patient education offered and stated understanding. Denies questions at this time.    Patient presents to [place] treatment for C1D1 Folfiri. VSS. Patient seen in clinic prior to appointment time by Lanelle Bal, APRN-NP, see clinic note for assessment details. Port flushed and positive for blood return. Premeds and prehydration given per treatment plan. Atropine x1 given prior to Irinotecan. Irinotecan given without incident. Port flushed and positive for blood return. 5FU CADD pump secured and infusing. All questions and concerns addressed. Copy of labs and chemo spill kit provided. CADD Pump video and education provided Patient left CC treatment ambulatory in stable condition.

## 2020-12-15 NOTE — Progress Notes
Date of Service: 12/15/2020      Subjective:             Reason for Visit:  No chief complaint on file.      Vicki Buchanan is a 65 y.o. 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 02/19/2017 diagnostic laparoscopy with biopsies of the right diaphragm, right liver, right fallopian tube and left pelvis. 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  Plans for exploratory laparotomy with cytoreductive surgery and hyperthermic intraperitoneal chemotherapy in combination with a total abdominal hysterectomy and bilateral salpingo-oophorectomy in Jan. 2019. Developed intestinal obstruction and taken to OR for CRS/HIPEC surgery; however multiple adhesions, omental cracking. CRS/HIPEC was aborted. Immunotherapy on Epacadostat?+ Sirolimus?trial 12/24/2017 - 05/21/2018. She stopped study drug on 05/21/18 for Mitomycin HIPEC trial at [facility] [place].  On 05/28/2018 diagnostic laparoscopy, evacuation of mucinous ascites 1.5 L, peritoneal biopsy, laparoscopic HIPEC with mitomycin-C in [place] by Dr. Daryl Eastern.  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  Completed second laparoscopic HIPEC on June 25, 2018. Again 1.5L removed.  09/12/18 She underwent debulking at [facility] in [place], [place]. Removed 98% by Dr. [name]. Discharged on 09/30/18. She had L. pleural effusion requiring chest tube drainage; intra-abd-abscess in Aug; discharged on 11/04/18 with drain and IV Cefipeme and Diflucan. Her ureteral stents were removed and ostomy relocated in Aug 2020. Also her ostomy was relocated. Hospitalized in Nov-2020 for AKI 2/2 dehydration and likely PE (V/Q scan and Top elevation). CEA increasing again. Repeat CT c/a/p 03/20/19 with stable to slight increase in cancer and RUL nodule. Discussed observation vs treatment. She chose observation and is considering repeat CRS + HIPEC via Dr. [name] in [place].CT scans from 09/24/2019 of the abdomen, without contrast, appear stable. The peritoneal thickening, carcinomatosis, retroperitoneal lymph nodes, a small amount of fluid collection or trace ascites, appear to be stable compared to 3 months ago. s/p RT to rt lung met on 05/21/20. CT scan on 08/17/2020 shows a right lung mass that is slightly increased from 11 mm to 14 mm. There are some new lung nodules that have developed, a small 1 in the left lung, a new nodule in the right lung. Inside the abdomen we are seeing continued increase in size of the peritoneal nodules. The largest measuring 14 mm compared to 12 mm. The nodule lower in the pelvis increased from 16 mm to 21 mm. 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 TMB = 1.3. s/p CRS w/ ostomy revision and tumor removal around stoma on 10/14/20. Post-op course complicated by high output ileostomy, anemia 2/2 acute blood loss and chronic, stage 3a CKD, and leukocytosis. [name] PA from [facility] cancer center in [place] called to relay their TB recommendations for patient.  Recommendations to proceed with folfiri/avastin with holding avastin 6-8 post op. They also request if our office can repeat genomic testing. Repeat TEMPUS from 10/14/20. Pre-screen for KRAS G12D inhibitor trial, if not eligible will plan for FOLFIRI +/- Bevacizumab          Interval Hx 12/15/20   Patient presents today to initiate.  She is feeling good with no pain or nausea.  She has recovered from her most recent surgery in [facility] w/ ostomy revision and tumor removal around stoma on 10/14/20.  Her ostomy is working better and she has found the right attachments to prevent leaking.  She continues to struggle with diarrhea and  uses Imodium 3 times a day and sometimes fiber supplement depending on her output.  She continues to get IV fluids in [place] 3 times a week to maintain hydration and her kidney function.  She reports that Dr. York Grice told her her kidneys are working fine.  Her appetite is good and she is starting to regain weight.       Review of Systems   Constitutional: Positive for fatigue (better). Negative for activity change, appetite change (improved), chills, diaphoresis, fever and unexpected weight change.   HENT: Negative for facial swelling, mouth sores, sore throat and trouble swallowing.    Eyes: Negative.  Negative for visual disturbance.   Respiratory: Negative.  Negative for cough, chest tightness, shortness of breath and wheezing.    Cardiovascular: Negative.  Negative for chest pain, palpitations and leg swelling.   Gastrointestinal: Positive for diarrhea. Negative for abdominal distention, abdominal pain, anal bleeding, blood in stool, constipation, nausea, rectal pain and vomiting.   Genitourinary: Negative.  Negative for difficulty urinating and hematuria.   Musculoskeletal: Negative.  Negative for arthralgias, back pain, neck pain and neck stiffness.   Skin: Positive for wound (healed). Negative for color change, pallor and rash.   Neurological: Positive for numbness (upper thighs). Negative for dizziness, weakness, light-headedness and headaches.   Hematological: Does not bruise/bleed easily.   Psychiatric/Behavioral: Negative for agitation. The patient is nervous/anxious.        Medical History:   Diagnosis Date   ? Bundle branch block    ? Cancer [facility])    ? Cancer of appendix [facility])      Surgical History:   Procedure Laterality Date   ? CESAREAN SECTION  1981   ? HX CHOLECYSTECTOMY  1995   ? LAPAROSCOPY  02/2017    biopsy peritoneal   ? COLONOSCOPY DIAGNOSTIC WITH SPECIMEN COLLECTION BY BRUSHING/ WASHING - FLEXIBLE N/A 03/19/2017    Performed by Everardo All, MD at [facility] ENDO   ? COLONOSCOPY WITH BIOPSY - FLEXIBLE  03/19/2017    Performed by Everardo All, MD at [facility] ENDO   ? EXPLORATORY LAPAROTOMY, DIVERTING LOOP ILEOSTOMY N/A 04/03/2017    Performed by Loralie Champagne, MD at [facility] OR   ? INSERTION TUNNELED CENTRAL VENOUS CATHETER - AGE 38 YEARS AND OVER Left 04/23/2017    Performed by Loralie Champagne, MD at [facility] OR   ? CYSTOURETHROSCOPY WITH INDWELLING URETERAL STENT INSERTION Bilateral 04/09/2018    Performed by Earl Many, MD at [facility] OR   ? CYSTOURETHROSCOPY WITH URETERAL CATHETERIZATION WITH/ WITHOUT IRRIGATION/ INSTILLATION/ URETEROPYELOGRAPHY Bilateral 04/09/2018    Performed by Earl Many, MD at [facility] OR   ? CYSTOURETHROSCOPY WITH INDWELLING URETERAL STENT EXCHANGE (RIGHT 6 x 26cm / LEFT 6 x 28cm) Bilateral 08/08/2018    Performed by Earl Many, MD at [facility] OR   ? RETROGRADE UROGRAPHY WITH/ WITHOUT KUB Bilateral 08/08/2018    Performed by Earl Many, MD at [facility] OR   ? ILEOSTOMY OR JEJUNOSTOMY Right    ? TUNNELED VENOUS PORT PLACEMENT Left    ? URETER STENT PLACEMENT Bilateral      Family History   Problem Relation Age of Onset   ? Diabetes Mother    ? Cancer Father    ? Diabetes Brother    ? Cancer-Lung Paternal Aunt    ? Cancer-Breast Maternal Grandmother    ? Diabetes Paternal Grandmother      Social History     Socioeconomic History   ? Marital status: Married  Tobacco Use   ? Smoking status: Never Smoker   ? Smokeless tobacco: Never Used   Vaping Use   ? Vaping Use: Never used   Substance and Sexual Activity   ? Alcohol use: No     Comment: rarely   ? Drug use: No         Objective:         ? Cetirizine (ZYRTEC) 10 mg cap Take 10 mg by mouth as Needed.   ? dexAMETHasone (DECADRON) 4 mg tablet 1 tab by mouth with breakfast and afternoon snack on days 2 & 3 following each chemotherapy cycle. Do not take after 6 pm to avoid insomnia.   ? famotidine (PEPCID AC) 10 mg tablet Take one tablet by mouth twice daily.   ? loperamide (IMODIUM A-D) 1 mg/7.5 mL oral solution Take 15 mL by mouth four times daily as needed. Indications: diarrhea caused by chemotherapy, high-output ileostomy   ? LORazepam (ATIVAN) 0.5 mg tablet Take 1-2 tabs by mouth every 6 hrs as needed N/V not controlled by Zofran or Compazine. May also use every 6 hrs as needed anxiety or at bedtime insomnia.   ? magnesium citrate 200 mg tab Take 1 tablet by mouth daily.   ? MULTIVITAMIN PO Take 1 tablet by mouth daily.   ? ondansetron (ZOFRAN ODT) 8 mg rapid dissolve tablet Dissolve 8 mg by mouth every 8 hours as needed for Nausea or Vomiting. Place on tongue to disolve.   ? ondansetron HCL (ZOFRAN) 8 mg tablet Take one tablet by mouth every 8 hours as needed for Nausea or Vomiting.   ? prochlorperazine maleate (COMPAZINE) 10 mg tablet 1 tablet by mouth every 6 hours as needed for nausea not controlled by Zofran.   ? psyllium husk (METAMUCIL) 0.4 gram cap    ? sodium chloride 0.9% (NS) 0.9 % 1,000 mL with potassium chloride 2 mEq/mL 20 mEq, magnesium sulfate 4 mEq/mL (50 %) 2 g IV infusion Administer 1L NS with K and 2g Mg once weekly via outpatient infusion.     Vitals:    12/15/20 1019   BP: (!) 145/73   BP Source: Arm, Right Upper   Pulse: 74   Temp: 36.3 ?C (97.3 ?F)   Resp: 16   SpO2: 100%   TempSrc: Temporal   PainSc: Zero   Weight: 54 kg (119 lb)   Height: 157 cm (5' 1.81)     Body mass index is 21.9 kg/m?Marland Kitchen     Pain Score: Zero         Pain Addressed:  N/A    Patient Evaluated for a Clinical Trial: Patient not eligible for a treatment trial (including not needing treatment, needs palliative care, in remission).     [place] Cooperative Oncology Group performance status is 1, Restricted in physically strenuous activity but ambulatory and able to carry out work of a light or sedentary nature, e.g., light house work, office work.     Physical Exam  Vitals reviewed.   Constitutional:       General: She is not in acute distress.     Appearance: Normal appearance. She is well-developed. She is not ill-appearing, toxic-appearing or diaphoretic.   HENT:      Head: Normocephalic and atraumatic.      Nose: Nose normal. No rhinorrhea.      Mouth/Throat:      Mouth: Mucous membranes are moist. Mucous membranes are not pale. No oral lesions.      Pharynx:  Oropharynx is clear. No oropharyngeal exudate or posterior oropharyngeal erythema.      Tonsils: No tonsillar abscesses.   Eyes:      General: No scleral icterus.        Right eye: No discharge.         Left eye: No discharge.      Extraocular Movements: Extraocular movements intact.      Conjunctiva/sclera: Conjunctivae normal.      Pupils: Pupils are equal, round, and reactive to light.   Cardiovascular:      Rate and Rhythm: Normal rate and regular rhythm.      Pulses: Normal pulses.      Heart sounds: Normal heart sounds. No murmur heard.    No gallop.   Pulmonary:      Effort: Pulmonary effort is normal. No respiratory distress.      Breath sounds: Normal breath sounds. No stridor. No wheezing, rhonchi or rales.   Chest:      Chest wall: No tenderness.      Comments: Port site wnl  Abdominal:      General: Bowel sounds are normal. There is no distension.      Palpations: Abdomen is soft. There is no mass.      Tenderness: There is no abdominal tenderness. There is no guarding or rebound.      Hernia: No hernia is present.       Musculoskeletal:         General: No swelling, tenderness, deformity or signs of injury. Normal range of motion.      Cervical back: Normal range of motion and neck supple. No rigidity. No muscular tenderness.      Right lower leg: No edema.      Left lower leg: No edema.   Lymphadenopathy:      Cervical: No cervical adenopathy.   Skin:     General: Skin is warm and dry.      Coloration: Skin is not jaundiced or pale.      Findings: No bruising, erythema, lesion or rash.   Neurological:      General: No focal deficit present.      Mental Status: She is alert and oriented to person, place, and time. Mental status is at baseline.      Cranial Nerves: No cranial nerve deficit.      Motor: No weakness.      Coordination: Coordination normal.      Gait: Gait normal.   Psychiatric:         Mood and Affect: Mood normal.         Behavior: Behavior normal.         Thought Content: Thought content normal.         Judgment: Judgment normal. CBC w/DIFF  CBC with Diff Latest Ref Rng & Units 12/15/2020 08/17/2020 03/24/2020 09/24/2019 09/05/2019   WBC 4.5 - 11.0 K/UL 7.9 13.5(H) 12.1(H) 13.0(H) 10.5   RBC 4.0 - 5.0 M/UL 3.22(L) 2.75(L) 3.20(L) 2.94(L) 3.49(L)   HGB 12.0 - 15.0 GM/DL 10.5(L) 9.1(L) 10.5(L) 10.1(L) 11.6(L)   HCT 36 - 45 % 31.3(L) 27.4(L) 31.7(L) 29.8(L) 37.6   MCV 80 - 100 FL 97.2 99.9 99.3 101.4(H) 108(H)   MCH 26 - 34 PG 32.6 33.2 32.8 34.4(H) 33.3(H)   MCHC 32.0 - 36.0 G/DL 16.1 09.6 04.5 40.9 81.1   RDW 11 - 15 % 15.1(H) 12.9 11.3 13.1 12.8   PLT 150 - 400 K/UL 573(H) 629(H) 474(H) 557(H) 470(H)  MPV 7 - 11 FL 7.2 6.8(L) 7.6 6.8(L) -   NEUT 41 - 77 % 56 66 61 70 -   ANC 1.8 - 7.0 K/UL 4.40 8.90(H) 7.40(H) 9.00(H) -   LYMA 24 - 44 % 25 18(L) 23(L) 16(L) -   ALYM 1.0 - 4.8 K/UL 2.00 2.50 2.80 2.10 -   MONA 4 - 12 % 13(H) 12 12 11  -   AMONO 0 - 0.80 K/UL 1.00(H) 1.60(H) 1.40(H) 1.40(H) -   EOSA 0 - 5 % 5 3 3 3  -   AEOS 0 - 0.45 K/UL 0.40 0.40 0.40 0.40 -   BASA 0 - 2 % 1 1 1  0 -   ABAS 0 - 0.20 K/UL 0.10 0.10 0.10 0.10 -     Comprehensive Metabolic Profile  CMP Latest Ref Rng & Units 12/15/2020 08/17/2020 03/24/2020 09/24/2019 09/05/2019   NA 137 - 147 MMOL/L 137 138 135(L) 137 139   K 3.5 - 5.1 MMOL/L 4.2 4.6 4.2 4.6 4.9   CL 98 - 110 MMOL/L 102 103 100 102 10.5   CO2 21 - 30 MMOL/L 28 29 27 27 25    GAP 3 - 12 7 6 8 8  -   BUN 7 - 25 MG/DL 24 16(X) 09(U) 04(V) 40(J)   CR 0.4 - 1.00 MG/DL 8.11(B) 1.47(W) 2.95(A) 2.16(H) 2.21(H)   GLUX 70 - 100 MG/DL 92 213(Y) 92 865(H) 86   CA 8.5 - 10.6 MG/DL 9.4 8.9 9.8 9.4 84.6   TP 6.0 - 8.0 G/DL 8.0 7.6 7.9 7.8 -   ALB 3.5 - 5.0 G/DL 3.7 3.3(L) 3.7 3.7 -   ALKP 25 - 110 U/L 356(H) 254(H) 285(H) 328(H) -   ALT 7 - 56 U/L 23 33 29 35 -   TBILI 0.3 - 1.2 MG/DL 0.4 0.4 0.5 0.4 -   GFR >60 mL/min - - - 23(L) 23.8(L)   GFRAA >60 mL/min - - - 28(L) -       Tumor Markers  Lab Results   Component Value Date    CEA 1,749.5 (H) 12/15/2020    CEA 1,403.1 (H) 08/17/2020    CEA 171.8 (H) 03/24/2020    CEA 1,882.9 (H) 09/24/2019    CEA 672.0 (H) 06/17/2019    CEA 665.4 (H) 02/28/2019    CEA 226.4 (H) 11/13/2018    CEA 3,307.8 (H) 07/17/2018    CEA 4,030.3 (H) 07/10/2018    CEA 2,575.3 (H) 06/10/2018    CA199 <1 12/15/2020    CA199 <1 06/25/2017    CA199 <1 06/11/2017    CA199 <1 05/28/2017    CA199 <1 05/14/2017    CA125 41 (H) 01/21/2018    CA125 42 (H) 11/21/2017    CA125 46 (H) 11/05/2017    CA125 41 (H) 10/22/2017    CA125 34 10/08/2017    CA125 34 09/24/2017    CA125 31 09/10/2017    CA125 29 08/27/2017    CA125 34 08/15/2017    CA125 39 (H) 07/30/2017        Assessment and Plan:  Metastatic appendiceal carcinoma. 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   02/19/2017 diagnostic laparoscopy with biopsies of the right diaphragm, right liver, right fallopian tube and left pelvis. 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  Plans for exploratory laparotomy with cytoreductive surgery and hyperthermic intraperitoneal chemotherapy in combination with a total abdominal hysterectomy and bilateral salpingo-oophorectomy in Jan. 2019. Developed intestinal obstruction and taken to  OR for CRS/HIPEC surgery; however multiple adhesions, omental cracking. CRS/HIPEC was aborted. Immunotherapy on Epacadostat?+ Sirolimus?trial 12/24/2017 - 05/21/2018. She stopped study drug on 05/21/18 for Mitomycin HIPEC trial at [facility] [place].  On 05/28/2018 diagnostic laparoscopy, evacuation of mucinous ascites 1.5 L, peritoneal biopsy, laparoscopic HIPEC with mitomycin-C in [place] by Dr. Daryl Eastern.  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  Completed second laparoscopic HIPEC on June 25, 2018. Again 1.5L removed. 09/12/18 She underwent debulking at [facility] in [place], [place]. Removed 98% by Dr. [name]. Discharged on 09/30/18. She had L. pleural effusion requiring chest tube drainage; intra-abd-abscess in Aug; discharged on 11/04/18 with drain and IV Cefipeme and Diflucan. Her ureteral stents were removed and ostomy relocated in Aug 2020. Also her ostomy was relocated. Hospitalized in Nov-2020 for AKI 2/2 dehydration and likely PE (V/Q scan and Top elevation). CEA increasing again. Repeat CT c/a/p 03/20/19 with stable to slight increase in cancer and RUL nodule. Discussed observation vs treatment. She chose observation and is considering repeat CRS + HIPEC via Dr. [name] in [place].CT scans from 09/24/2019 of the abdomen, without contrast, appear stable. The peritoneal thickening, carcinomatosis, retroperitoneal lymph nodes, a small amount of fluid collection or trace ascites, appear to be stable compared to 3 months ago. s/p RT to rt lung met on 05/21/20. CT scan on 08/17/2020 shows a right lung mass that is slightly increased from 11 mm to 14 mm. There are some new lung nodules that have developed, a small 1 in the left lung, a new nodule in the right lung. Inside the abdomen we are seeing continued increase in size of the peritoneal nodules. The largest measuring 14 mm compared to 12 mm. The nodule lower in the pelvis increased from 16 mm to 21 mm. 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 TMB = 1.3. s/p CRS w/ ostomy revision and tumor removal around stoma on 10/14/20. Post-op course complicated by high output ileostomy, anemia 2/2 acute blood loss and chronic, stage 3a CKD, and leukocytosis. [name] PA from [facility] cancer center in [place] called to relay their TB recommendations for patient.  Recommendations to proceed with folfiri/avastin with holding avastin 6-8 post op. They also request if our office can repeat genomic testing. Repeat TEMPUS from 10/14/20. Pre-screen for KRAS G12D inhibitor trial, if not eligible will plan for FOLFIRI +/- Bevacizumab    Plan 12/15/20   Patient presents today recovered from surgery and ready to restart chemotherapy  Teaching and consent on FOLFIRI today with cycle 1 following this appointment.  She will get her pump disconnected in [place] at the local infusion center.  She will continue IV fluids on Mondays, Wednesdays and Fridays.  We will give her IV fluids when she is here for chemotherapy.  She will continue her magnesium supplement and we will monitor.  May need to hold if diarrhea from irinotecan is significant.  Will discuss with Dr. Josiah Lobo on return if we will be adding bevacizumab or not to her regimen  Return to clinic in 2 weeks for cycle 2  Repeat imaging after 4 cycles  Continue supportive care    Neuropathy, from oxaliplatin. Continue to monitor. Stable, using irinotecan    Elevated creatinine, S/P ureteral stent. Her ureteral stents were removed in Aug 2020. Cr. 1.79. Encouraged hydration. S/p IVF 1L NS and 1g Mag weekly x 4 weeks.   Refer to Nephrology as Cr is improving with IVF.  She is following with Dr. Marjie Skiff in  nephrology.  Stable today at 1.9.  Continue IV fluids 3 times a week locally, as we anticipate more diarrhea with irinotecan.    Increased ostomy output.  She is able to control with Imodium 3 times a day and as needed Metamucil.  When she took Metamucil more than once a day she had some signs of constipation.  Refill Imodium today.  Monitor closely with irinotecan.  Continue IV fluid support and following with nephrology to maintain kidney function.    Urinary tract infection.  Improved after antibiotics and IV fluids.    APP Chemotherapy Education    IV Chemotherapy: The following is a summary of the patient's IV Chemotherapy Education.    A thorough pre-assessment and teaching session explaining the mechanism of action, possible side effects, precautions and instructions regarding 5-fluorouracil and irinotecan for control intent was conducted. The patient will initiate treatment today. The cycle will repeat every 14 days unless disease progresses or unacceptable toxicity is reached. .  Plan of administration was reviewed.      Both written and verbal information were given to the patient.    The planned course of treatment, anticipated benefits, material risks and potential side effects that may occur with this course of treatment were explained to the patient.  Side effects and their management were discussed in detail and include, but are not limited to:  Side effects include but limited to: N/V, fatigue, cytopenias, chest pain/vaso-spasms, hair thinning or loss, mucositis, rash, PPE, abdominal cramping, malaise, arthralgias, diarrhea or constipation. Discussed with pt and spouse when to call the Cancer Center: fevers, diarrhea, constipation, N/V, pain, rash, mucositis or with any other questions or concerns.     Reproductive concerns were not discussed with the patient  Infertility risks were not applicable and therefore not discussed    Appropriate handling of body secretions and waste at home were reviewed as applicable.    Prescriptions for supportive medications including ondansetron, dexamethasone, prochlorperazine and were e-scripted to their pharmacy and discussed in detail how to take. Drug to drug interactions were reviewed as applicable.     The patient has received contact information for the clinic and was instructed on when and who to call.     The patient verbalized understanding, was given the opportunity to ask questions, and the consent form was signed.     Follow up appointment with physician in 2 weeks with labs.       Turner Daniels, APRN-NP        The patient and family were allowed to ask questions and voice concerns; these were addressed to the best of our ability. They expressed understanding of what was explained to them, and they agreed with the present plan. RTC in 2 weeks with labs to see a provider. Patient has the phone numbers for the Cancer Center and was instructed on how to contact us with any questions or concerns. My collaborating physician on this case is Dr. Josiah Lobo.

## 2020-12-15 NOTE — Progress Notes
Faxed [facility] [place] orders to [facility] in [place], [place] at [phone] per Lanelle Bal, APRN. Successful fax return received.

## 2020-12-15 NOTE — Patient Instructions
Dr. Blanca Friend MD Medical Oncologist specializing in [place] APRN    Nurses: Clinton Quant RN BSN and Yehuda Savannah RN BSN. Phone: [phone] Fax: [phone], available Monday-Friday 8:00am- 4:00pm    On call number for urgent needs outside of business hours [phone]- ask for the On call Oncology fellow to be paged and they will call you back.    Medication refills: please contact your pharmacy for medication refills, if they do not have any refills on file they will contact the office, make sure they have our correct contact information on file   P: [phone], F: [phone]    MyChart Messages: All mychart messages are answered by the nurses, even if you select to send the message to Dr. Eldred Manges or Junie Panning.  We often communicate with Dr. Eldred Manges and Junie Panning on how to answer these messages, but all messages will come from the nurses.  Messages are answered 8:00am-4:00pm Monday-Friday, holidays excluded.    Phone Calls: We are hardly ever sitting at our desks where our phone is located as we are in clinic most days during the week. Please leave [place] a message as we check our messages several times per day.  It is our goal to answer these messages as soon as possible.  Messages are answered from 8:00am-4:00 pm Monday-Friday, holidays excluded.  Please make sure you leave your full name with the spelling, date of birth, reason for your call, and return number when leaving a message.

## 2020-12-17 ENCOUNTER — Encounter: Admit: 2020-12-17 | Discharge: 2020-12-17 | Payer: MEDICARE

## 2020-12-17 MED ORDER — LOPERAMIDE 2 MG PO CAP
ORAL_CAPSULE | Freq: Four times a day (QID) | ORAL | 11 refills | 10.00000 days | Status: AC | PRN
Start: 2020-12-17 — End: ?

## 2020-12-17 MED ORDER — LOPERAMIDE 2 MG PO CAP
ORAL_CAPSULE | 0 refills | Status: CN
Start: 2020-12-17 — End: ?

## 2020-12-17 NOTE — Telephone Encounter
Erroneous encounter.

## 2020-12-23 ENCOUNTER — Encounter: Admit: 2020-12-23 | Discharge: 2020-12-23 | Payer: MEDICARE

## 2020-12-26 ENCOUNTER — Encounter: Admit: 2020-12-26 | Discharge: 2020-12-26 | Payer: MEDICARE

## 2020-12-26 MED ORDER — PALONOSETRON 0.25 MG/5 ML IV SOLN
.25 mg | Freq: Once | INTRAVENOUS | 0 refills
Start: 2020-12-26 — End: ?

## 2020-12-26 MED ORDER — DEXAMETHASONE 6 MG PO TAB
12 mg | Freq: Once | ORAL | 0 refills
Start: 2020-12-26 — End: ?

## 2020-12-26 MED ORDER — ATROPINE 0.4 MG/ML IJ SOLN
0.4 mg | INTRAVENOUS | 0 refills | PRN
Start: 2020-12-26 — End: ?

## 2020-12-26 MED ORDER — FLUOROURACIL IV AMB PUMP
2400 mg/m2 | Freq: Once | INTRAVENOUS | 0 refills
Start: 2020-12-26 — End: ?

## 2020-12-29 ENCOUNTER — Encounter: Admit: 2020-12-29 | Discharge: 2020-12-29 | Payer: MEDICARE

## 2020-12-29 DIAGNOSIS — C786 Secondary malignant neoplasm of retroperitoneum and peritoneum: Secondary | ICD-10-CM

## 2020-12-29 DIAGNOSIS — I454 Nonspecific intraventricular block: Secondary | ICD-10-CM

## 2020-12-29 DIAGNOSIS — C181 Malignant neoplasm of appendix: Secondary | ICD-10-CM

## 2020-12-29 DIAGNOSIS — C801 Malignant (primary) neoplasm, unspecified: Secondary | ICD-10-CM

## 2020-12-29 MED ORDER — OPIUM TINCTURE 10 MG/ML ORAL
.6 mL | ORAL | 0 refills | 3.00000 days | Status: AC
Start: 2020-12-29 — End: ?
  Filled 2020-12-29: qty 118, 49d supply, fill #1

## 2020-12-29 MED ORDER — ATROPINE 0.4 MG/ML IJ SOLN
0.4 mg | INTRAVENOUS | 0 refills | Status: AC | PRN
Start: 2020-12-29 — End: ?
  Administered 2020-12-29: 18:00:00 0.4 mg via INTRAVENOUS

## 2020-12-29 MED ORDER — FLUOROURACIL IV AMB PUMP
2400 mg/m2 | Freq: Once | INTRAVENOUS | 0 refills
Start: 2020-12-29 — End: ?

## 2020-12-29 MED ORDER — PALONOSETRON 0.25 MG/5 ML IV SOLN
.25 mg | Freq: Once | INTRAVENOUS | 0 refills | Status: CP
Start: 2020-12-29 — End: ?
  Administered 2020-12-29: 16:00:00 0.25 mg via INTRAVENOUS

## 2020-12-29 MED ORDER — SODIUM CHLORIDE 0.9 % IV SOLP
Freq: Once | INTRAVENOUS | 0 refills | Status: CP
Start: 2020-12-29 — End: ?
  Administered 2020-12-29: 16:00:00 1000.000 mL via INTRAVENOUS

## 2020-12-29 MED ORDER — OPIUM TINCTURE 10 MG/ML ORAL
.6 mL | ORAL | 0 refills | 3.00000 days | Status: DC
Start: 2020-12-29 — End: 2020-12-29

## 2020-12-29 MED ORDER — IRINOTECAN IVPB
150 mg/m2 | Freq: Once | INTRAVENOUS | 0 refills | Status: CP
Start: 2020-12-29 — End: ?
  Administered 2020-12-29 (×3): 226.6 mg via INTRAVENOUS

## 2020-12-29 MED ORDER — DEXAMETHASONE 6 MG PO TAB
12 mg | Freq: Once | ORAL | 0 refills | Status: CP
Start: 2020-12-29 — End: ?
  Administered 2020-12-29: 16:00:00 12 mg via ORAL

## 2020-12-29 MED ORDER — FLUOROURACIL IV AMB PUMP
2400 mg/m2 | Freq: Once | INTRAVENOUS | 0 refills | Status: CP
Start: 2020-12-29 — End: ?
  Administered 2020-12-29 (×4): 3624 mg via INTRAVENOUS

## 2020-12-29 NOTE — Patient Instructions
Cut down decadron to one pill each morning for days 2, 3 of each cycle. Don't take the afternoon dose and see if this helps with heartburn. You can also add prilosec. Ok to use baking soda as well.

## 2020-12-29 NOTE — Progress Notes
Cycle 2 Day 1 FOLFIRI    Patient to follow up with Junie Panning, NP.   OK to treat, labs OK to treat.  Port flushed, brisk blood return.   Tolerated infusion well.   Discharged in stable condition with ambulatory pump infusing.     CHEMO NOTE  Verified chemo consent signed and in chart.    Blood return positive via: Port (Single)    BSA and dose double checked (agree with orders as written) with: yes     Labs/applicable tests checked: CBC and Comprehensive Metabolic Panel (CMP)    Chemo regimen: Drug/cycle/day FOLFIRI C2 D1    Rate verified and armband double check with second RN: yes, see EMAR    Patient education offered and stated understanding. Denies questions at this time.

## 2020-12-29 NOTE — Patient Instructions
Call Immediately to report the following:  Unexplained bleeding or bleeding that will not stop  Difficulty swallowing  Shortness of breath, wheezing, or trouble breathing  Rapid, irregular heartbeat; chest pain  Dizziness, lightheadedness  Rash or cut that swells or turns red, feels hot or painful, or begin to ooze  Diarrhea   Uncontrolled nausea or vomiting  Fever of 100.4 F or higher, or chills    Important Phone Numbers:  Cancer Center Main Number (answered 24 hours a day) [phone]  Cancer Center Scheduling (appointments) [phone]  Social Worker [phone]  Nutritionist [phone]

## 2020-12-29 NOTE — Progress Notes
Date of Service: 12/29/2020      Subjective:             Reason for Visit:  Follow Up      Vicki Buchanan is a 65 y.o. 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 02/19/2017 diagnostic laparoscopy with biopsies of the right diaphragm, right liver, right fallopian tube and left pelvis. 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  Plans for exploratory laparotomy with cytoreductive surgery and hyperthermic intraperitoneal chemotherapy in combination with a total abdominal hysterectomy and bilateral salpingo-oophorectomy in Jan. 2019. Developed intestinal obstruction and taken to OR for CRS/HIPEC surgery; however multiple adhesions, omental cracking. CRS/HIPEC was aborted. Immunotherapy on Epacadostat?+ Sirolimus?trial 12/24/2017 - 05/21/2018. She stopped study drug on 05/21/18 for Mitomycin HIPEC trial at [facility] [place].  On 05/28/2018 diagnostic laparoscopy, evacuation of mucinous ascites 1.5 L, peritoneal biopsy, laparoscopic HIPEC with mitomycin-C in [place] by Dr. Daryl Eastern.  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  Completed second laparoscopic HIPEC on June 25, 2018. Again 1.5L removed.  09/12/18 She underwent debulking at [facility] in [place], [place]. Removed 98% by Dr. [name]. Discharged on 09/30/18. She had L. pleural effusion requiring chest tube drainage; intra-abd-abscess in Aug; discharged on 11/04/18 with drain and IV Cefipeme and Diflucan. Her ureteral stents were removed and ostomy relocated in Aug 2020. Also her ostomy was relocated. Hospitalized in Nov-2020 for AKI 2/2 dehydration and likely PE (V/Q scan and Top elevation). CEA increasing again. Repeat CT c/a/p 03/20/19 with stable to slight increase in cancer and RUL nodule. Discussed observation vs treatment. She chose observation and is considering repeat CRS + HIPEC via Dr. [name] in [place].CT scans from 09/24/2019 of the abdomen, without contrast, appear stable. The peritoneal thickening, carcinomatosis, retroperitoneal lymph nodes, a small amount of fluid collection or trace ascites, appear to be stable compared to 3 months ago. s/p RT to rt lung met on 05/21/20. CT scan on 08/17/2020 shows a right lung mass that is slightly increased from 11 mm to 14 mm. There are some new lung nodules that have developed, a small 1 in the left lung, a new nodule in the right lung. Inside the abdomen we are seeing continued increase in size of the peritoneal nodules. The largest measuring 14 mm compared to 12 mm. The nodule lower in the pelvis increased from 16 mm to 21 mm. 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 TMB = 1.3. s/p CRS w/ ostomy revision and tumor removal around stoma on 10/14/20. Post-op course complicated by high output ileostomy, anemia 2/2 acute blood loss and chronic, stage 3a CKD, and leukocytosis. [name] PA from [facility] cancer center in [place] called to relay their TB recommendations for patient.  Recommendations to proceed with folfiri/avastin with holding avastin 6-8 post op. They also request if our office can repeat genomic testing. Repeat TEMPUS from 10/14/20. Pre-screen for KRAS G12D inhibitor trial, if not eligible will plan for FOLFIRI +/- Bevacizumab        Interval Hx 12/29/20   Pt tolerated treatment fairly well. She had more diarrhea and took more imodium. This helped some. She needed more IVF and magnesium. She had some nausea but was able to control it. She denies any pain. She continues to get fluids closer to home.        Review of Systems   Constitutional: Positive for fatigue. Negative for activity change, appetite  change, chills, diaphoresis, fever and unexpected weight change.   HENT: Negative for facial swelling, mouth sores, sore throat and trouble swallowing.    Eyes: Negative.  Negative for visual disturbance.   Respiratory: Negative.  Negative for cough, chest tightness, shortness of breath and wheezing.    Cardiovascular: Negative.  Negative for chest pain, palpitations and leg swelling.   Gastrointestinal: Positive for diarrhea and nausea. Negative for abdominal distention, abdominal pain, anal bleeding, blood in stool, constipation, rectal pain and vomiting.   Genitourinary: Negative.  Negative for difficulty urinating and hematuria.   Musculoskeletal: Negative.  Negative for arthralgias, back pain, neck pain and neck stiffness.   Skin: Negative for color change, pallor, rash and wound.   Neurological: Negative for dizziness, weakness, light-headedness, numbness and headaches.   Hematological: Does not bruise/bleed easily.   Psychiatric/Behavioral: Negative for agitation. The patient is nervous/anxious.        Medical History:   Diagnosis Date   ? Bundle branch block    ? Cancer [facility])    ? Cancer of appendix [facility])      Surgical History:   Procedure Laterality Date   ? CESAREAN SECTION  1981   ? HX CHOLECYSTECTOMY  1995   ? LAPAROSCOPY  02/2017    biopsy peritoneal   ? COLONOSCOPY DIAGNOSTIC WITH SPECIMEN COLLECTION BY BRUSHING/ WASHING - FLEXIBLE N/A 03/19/2017    Performed by Everardo All, MD at [facility]   ? COLONOSCOPY WITH BIOPSY - FLEXIBLE  03/19/2017    Performed by Everardo All, MD at [facility] END[facility]   ? EXPLORATORY LAPAROTOMY, DIVERTING LOOP ILEOSTOMY N/A 04/03/2017    Performed by Loralie Champagne, MD at [facility] OR   ? INSERTION TUNNELED CENTRAL VENOUS CATHETER - AGE 50 YEARS AND OVER Left 04/23/2017    Performed by Loralie Champagne, MD at [facility] OR   ? CYSTOURETHROSCOPY WITH INDWELLING URETERAL STENT INSERTION Bilateral 04/09/2018    Performed by Earl Many, MD at [facility] OR   ? CYSTOURETHROSCOPY WITH URETERAL CATHETERIZATION WITH/ WITHOUT IRRIGATION/ INSTILLATION/ URETEROPYELOGRAPHY Bilateral 04/09/2018    Performed by Earl Many, MD at [facility] OR   ? CYSTOURETHROSCOPY WITH INDWELLING URETERAL STENT EXCHANGE (RIGHT 6 x 26cm / LEFT 6 x 28cm) Bilateral 08/08/2018    Performed by Earl Many, MD at [facility] OR   ? RETROGRADE UROGRAPHY WITH/ WITHOUT KUB Bilateral 08/08/2018    Performed by Earl Many, MD at [facility] OR   ? ILEOSTOMY OR JEJUNOSTOMY Right    ? TUNNELED VENOUS PORT PLACEMENT Left    ? URETER STENT PLACEMENT Bilateral      Family History   Problem Relation Age of Onset   ? Diabetes Mother    ? Cancer Father    ? Diabetes Brother    ? Cancer-Lung Paternal Aunt    ? Cancer-Breast Maternal Grandmother    ? Diabetes Paternal Grandmother      Social History     Socioeconomic History   ? Marital status: Married   Tobacco Use   ? Smoking status: Never Smoker   ? Smokeless tobacco: Never Used   Vaping Use   ? Vaping Use: Never used   Substance and Sexual Activity   ? Alcohol use: No     Comment: rarely   ? Drug use: No         Objective:         ? Cetirizine (ZYRTEC) 10 mg cap Take 10 mg by mouth as Needed.   ? dexAMETHasone (DECADRON) 4  mg tablet 1 tab by mouth with breakfast and afternoon snack on days 2 & 3 following each chemotherapy cycle. Do not take after 6 pm to avoid insomnia.   ? famotidine (PEPCID AC) 10 mg tablet Take one tablet by mouth twice daily.   ? loperamide (IMODIUM A-D) 2 mg capsule Take one pill by mouth four times daily as needed   ? LORazepam (ATIVAN) 0.5 mg tablet Take 1-2 tabs by mouth every 6 hrs as needed N/V not controlled by Zofran or Compazine. May also use every 6 hrs as needed anxiety or at bedtime insomnia.   ? magnesium citrate 200 mg tab Take 1 tablet by mouth daily.   ? MULTIVITAMIN PO Take 1 tablet by mouth daily.   ? ondansetron (ZOFRAN ODT) 8 mg rapid dissolve tablet Dissolve 8 mg by mouth every 8 hours as needed for Nausea or Vomiting. Place on tongue to disolve.   ? ondansetron HCL (ZOFRAN) 8 mg tablet Take one tablet by mouth every 8 hours as needed for Nausea or Vomiting.   ? prochlorperazine maleate (COMPAZINE) 10 mg tablet 1 tablet by mouth every 6 hours as needed for nausea not controlled by Zofran.   ? psyllium husk (METAMUCIL) 0.4 gram cap ? sodium chloride 0.9% (NS) 0.9 % 1,000 mL with potassium chloride 2 mEq/mL 20 mEq, magnesium sulfate 4 mEq/mL (50 %) 2 g IV infusion Administer 1L NS with K and 2g Mg once weekly via outpatient infusion.     Vitals:    12/29/20 0856   BP: 124/85   BP Source: Arm, Right Upper   Pulse: 74   Temp: 36.7 ?C (98 ?F)   Resp: 16   SpO2: 100%   TempSrc: Oral   PainSc: Zero   Weight: 53 kg (116 lb 12.8 oz)     Body mass index is 21.77 kg/m?Marland Kitchen     Pain Score: Zero         Pain Addressed:  N/A    Patient Evaluated for a Clinical Trial: Patient not eligible for a treatment trial (including not needing treatment, needs palliative care, in remission).     [place] Cooperative Oncology Group performance status is 1, Restricted in physically strenuous activity but ambulatory and able to carry out work of a light or sedentary nature, e.g., light house work, office work.     Physical Exam  Vitals reviewed.   Constitutional:       General: She is not in acute distress.     Appearance: Normal appearance. She is well-developed. She is not ill-appearing, toxic-appearing or diaphoretic.   HENT:      Head: Normocephalic and atraumatic.      Nose: Nose normal. No rhinorrhea.      Mouth/Throat:      Mouth: Mucous membranes are moist. Mucous membranes are not pale. No oral lesions.      Pharynx: Oropharynx is clear. No oropharyngeal exudate or posterior oropharyngeal erythema.      Tonsils: No tonsillar abscesses.   Eyes:      General: No scleral icterus.        Right eye: No discharge.         Left eye: No discharge.      Extraocular Movements: Extraocular movements intact.      Conjunctiva/sclera: Conjunctivae normal.      Pupils: Pupils are equal, round, and reactive to light.   Cardiovascular:      Rate and Rhythm: Normal rate and regular rhythm.  Pulses: Normal pulses.      Heart sounds: Normal heart sounds. No murmur heard.    No gallop.   Pulmonary:      Effort: Pulmonary effort is normal. No respiratory distress.      Breath sounds: Normal breath sounds. No stridor. No wheezing, rhonchi or rales.   Chest:      Chest wall: No tenderness.      Comments: Port site wnl  Abdominal:      General: Bowel sounds are normal. There is no distension.      Palpations: Abdomen is soft. There is no mass.      Tenderness: There is no abdominal tenderness. There is no guarding or rebound.      Hernia: No hernia is present.       Musculoskeletal:         General: No swelling, tenderness, deformity or signs of injury. Normal range of motion.      Cervical back: Normal range of motion and neck supple. No rigidity. No muscular tenderness.      Right lower leg: No edema.      Left lower leg: No edema.   Lymphadenopathy:      Cervical: No cervical adenopathy.   Skin:     General: Skin is warm and dry.      Coloration: Skin is not jaundiced or pale.      Findings: No bruising, erythema, lesion or rash.   Neurological:      General: No focal deficit present.      Mental Status: She is alert and oriented to person, place, and time. Mental status is at baseline.      Cranial Nerves: No cranial nerve deficit.      Motor: No weakness.      Coordination: Coordination normal.      Gait: Gait normal.   Psychiatric:         Mood and Affect: Mood normal.         Behavior: Behavior normal.         Thought Content: Thought content normal.         Judgment: Judgment normal.            CBC w/DIFF  CBC with Diff Latest Ref Rng & Units 12/29/2020 12/15/2020 08/17/2020 03/24/2020 09/24/2019   WBC 4.5 - 11.0 K/UL 7.5 7.9 13.5(H) 12.1(H) 13.0(H)   RBC 4.0 - 5.0 M/UL 3.17(L) 3.22(L) 2.75(L) 3.20(L) 2.94(L)   HGB 12.0 - 15.0 GM/DL 10.4(L) 10.5(L) 9.1(L) 10.5(L) 10.1(L)   HCT 36 - 45 % 31.3(L) 31.3(L) 27.4(L) 31.7(L) 29.8(L)   MCV 80 - 100 FL 98.8 97.2 99.9 99.3 101.4(H)   MCH 26 - 34 PG 32.8 32.6 33.2 32.8 34.4(H)   MCHC 32.0 - 36.0 G/DL 16.1 09.6 04.5 40.9 81.1   RDW 11 - 15 % 14.3 15.1(H) 12.9 11.3 13.1   PLT 150 - 400 K/UL 405(H) 573(H) 629(H) 474(H) 557(H)   MPV 7 - 11 FL 7.8 7.2 6.8(L) 7.6 6.8(L)   NEUT 41 - 77 % 49 56 66 61 70   ANC 1.8 - 7.0 K/UL 3.70 4.40 8.90(H) 7.40(H) 9.00(H)   LYMA 24 - 44 % 30 25 18(L) 23(L) 16(L)   ALYM 1.0 - 4.8 K/UL 2.20 2.00 2.50 2.80 2.10   MONA 4 - 12 % 13(H) 13(H) 12 12 11    AMONO 0 - 0.80 K/UL 1.00(H) 1.00(H) 1.60(H) 1.40(H) 1.40(H)   EOSA 0 - 5 % 6(H) 5 3 3  3  AEOS 0 - 0.45 K/UL 0.40 0.40 0.40 0.40 0.40   BASA 0 - 2 % 2 1 1 1  0   ABAS 0 - 0.20 K/UL 0.10 0.10 0.10 0.10 0.10     Comprehensive Metabolic Profile  CMP Latest Ref Rng & Units 12/29/2020 12/15/2020 08/17/2020 03/24/2020 09/24/2019   NA 137 - 147 MMOL/L 138 137 138 135(L) 137   K 3.5 - 5.1 MMOL/L 4.4 4.2 4.6 4.2 4.6   CL 98 - 110 MMOL/L 101 102 103 100 102   CO2 21 - 30 MMOL/L 28 28 29 27 27    GAP 3 - 12 9 7 6 8 8    BUN 7 - 25 MG/DL 64(Q) 24 03(K) 74(Q) 59(D)   CR 0.4 - 1.00 MG/DL 6.38(V) 5.64(P) 3.29(J) 2.02(H) 2.16(H)   GLUX 70 - 100 MG/DL 188(C) 92 166(A) 92 630(Z)   CA 8.5 - 10.6 MG/DL 9.2 9.4 8.9 9.8 9.4   TP 6.0 - 8.0 G/DL 7.6 8.0 7.6 7.9 7.8   ALB 3.5 - 5.0 G/DL 3.9 3.7 3.3(L) 3.7 3.7   ALKP 25 - 110 U/L 424(H) 356(H) 254(H) 285(H) 328(H)   ALT 7 - 56 U/L 71(H) 23 33 29 35   TBILI 0.3 - 1.2 MG/DL 0.4 0.4 0.4 0.5 0.4   GFR >60 mL/min - - - - 23(L)   GFRAA >60 mL/min - - - - 28(L)       Tumor Markers  Lab Results   Component Value Date    CEA 1,749.5 (H) 12/15/2020    CEA 1,403.1 (H) 08/17/2020    CEA 171.8 (H) 03/24/2020    CEA 1,882.9 (H) 09/24/2019    CEA 672.0 (H) 06/17/2019    CEA 665.4 (H) 02/28/2019    CEA 226.4 (H) 11/13/2018    CEA 3,307.8 (H) 07/17/2018    CEA 4,030.3 (H) 07/10/2018    CEA 2,575.3 (H) 06/10/2018    CA199 <1 12/15/2020    CA199 <1 06/25/2017    CA199 <1 06/11/2017    CA199 <1 05/28/2017    CA199 <1 05/14/2017    CA125 41 (H) 01/21/2018    CA125 42 (H) 11/21/2017    CA125 46 (H) 11/05/2017    CA125 41 (H) 10/22/2017    CA125 34 10/08/2017    CA125 34 09/24/2017    CA125 31 09/10/2017    CA125 29 08/27/2017    CA125 34 08/15/2017    CA125 39 (H) 07/30/2017        Assessment and Plan:  Metastatic appendiceal carcinoma. 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   02/19/2017 diagnostic laparoscopy with biopsies of the right diaphragm, right liver, right fallopian tube and left pelvis. 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  Plans for exploratory laparotomy with cytoreductive surgery and hyperthermic intraperitoneal chemotherapy in combination with a total abdominal hysterectomy and bilateral salpingo-oophorectomy in Jan. 2019. Developed intestinal obstruction and taken to OR for CRS/HIPEC surgery; however multiple adhesions, omental cracking. CRS/HIPEC was aborted. Immunotherapy on Epacadostat?+ Sirolimus?trial 12/24/2017 - 05/21/2018. She stopped study drug on 05/21/18 for Mitomycin HIPEC trial at [facility].  On 05/28/2018 diagnostic laparoscopy, evacuation of mucinous ascites 1.5 L, peritoneal biopsy, laparoscopic HIPEC with mitomycin-C in [place] by Dr. Daryl Eastern.  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  Completed second laparoscopic HIPEC on June 25, 2018. Again 1.5L removed. 09/12/18 She underwent debulking at [facility] in [place], [place]. Removed 98% by Dr. [name]. Discharged on 09/30/18. She had  L. pleural effusion requiring chest tube drainage; intra-abd-abscess in Aug; discharged on 11/04/18 with drain and IV Cefipeme and Diflucan. Her ureteral stents were removed and ostomy relocated in Aug 2020. Also her ostomy was relocated. Hospitalized in Nov-2020 for AKI 2/2 dehydration and likely PE (V/Q scan and Top elevation). CEA increasing again. Repeat CT c/a/p 03/20/19 with stable to slight increase in cancer and RUL nodule. Discussed observation vs treatment. She chose observation and is considering repeat CRS + HIPEC via Dr. [name] in [place].CT scans from 09/24/2019 of the abdomen, without contrast, appear stable. The peritoneal thickening, carcinomatosis, retroperitoneal lymph nodes, a small amount of fluid collection or trace ascites, appear to be stable compared to 3 months ago. s/p RT to rt lung met on 05/21/20. CT scan on 08/17/2020 shows a right lung mass that is slightly increased from 11 mm to 14 mm. There are some new lung nodules that have developed, a small 1 in the left lung, a new nodule in the right lung. Inside the abdomen we are seeing continued increase in size of the peritoneal nodules. The largest measuring 14 mm compared to 12 mm. The nodule lower in the pelvis increased from 16 mm to 21 mm. 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 TMB = 1.3. s/p CRS w/ ostomy revision and tumor removal around stoma on 10/14/20. Post-op course complicated by high output ileostomy, anemia 2/2 acute blood loss and chronic, stage 3a CKD, and leukocytosis. [name] PA from [facility] cancer center in [place] called to relay their TB recommendations for patient.  Recommendations to proceed with folfiri/avastin with holding avastin 6-8 post op. They also request if our office can repeat genomic testing. Repeat TEMPUS from 10/14/20. Pre-screen for KRAS G12D inhibitor trial, if not eligible will plan for FOLFIRI +/- Bevacizumab    Plan 12/29/20   Tolerated FOLFIRI cycle 1 but more diarrhea.  She will get her pump disconnected in [place] at the local infusion center.  She will continue IV fluids on Mondays, Wednesdays and Fridays.  We will give her IV fluids when she is here for chemotherapy.  She will continue her magnesium supplement and we will monitor.  Decrease irinotecan to 150 mg/m2 due to diarrhea.  Add bevacizumab with next cycle  Return to clinic in 2 weeks for cycle 3  Repeat imaging after 4 cycles  Continue supportive care    Neuropathy, from oxaliplatin. Continue to monitor. Stable, using irinotecan    Elevated creatinine, S/P ureteral stent. Her ureteral stents were removed in Aug 2020. Cr. 1.79. Encouraged hydration. S/p IVF 1L NS and 1g Mag weekly x 4 weeks.   Refer to Nephrology as Cr is improving with IVF.  She is following with Dr. Marjie Skiff in nephrology.  Stable today at 2.12.  Continue IV fluids 3 times a week locally, as we anticipate more diarrhea with irinotecan.    Increased ostomy output.  She is able to control with Imodium 3 times a day and as needed Metamucil.  When she took Metamucil more than once a day she had some signs of constipation.  Refill Imodium today.  Monitor closely with irinotecan.  Continue IV fluid support and following with nephrology to maintain kidney function. Decrease irinotecan, add tincture of opium. Magnesium replacement closer to home for mag of 1.4. IVF today.    Hypomagnesemia. 1.4 today. Replace closer to home as needed. Will try holding oral replacement, as this might add to diarrhea.     Nausea. From  chemo. Decrease dose, continue antiemetics.     Turner Daniels, APRN-NP        The patient and family were allowed to ask questions and voice concerns; these were addressed to the best of our ability. They expressed understanding of what was explained to them, and they agreed with the present plan. RTC in 2 weeks with labs to see a provider. Patient has the phone numbers for the Cancer Center and was instructed on how to contact us with any questions or concerns. My collaborating physician on this case is Dr. Josiah Lobo.

## 2021-01-05 ENCOUNTER — Encounter: Admit: 2021-01-05 | Discharge: 2021-01-05 | Payer: MEDICARE

## 2021-01-05 DIAGNOSIS — C181 Malignant neoplasm of appendix: Secondary | ICD-10-CM

## 2021-01-06 ENCOUNTER — Encounter: Admit: 2021-01-06 | Discharge: 2021-01-06 | Payer: MEDICARE

## 2021-01-06 DIAGNOSIS — C181 Malignant neoplasm of appendix: Secondary | ICD-10-CM

## 2021-01-12 ENCOUNTER — Encounter: Admit: 2021-01-12 | Discharge: 2021-01-12 | Payer: MEDICARE

## 2021-01-12 DIAGNOSIS — C786 Secondary malignant neoplasm of retroperitoneum and peritoneum: Secondary | ICD-10-CM

## 2021-01-12 DIAGNOSIS — I454 Nonspecific intraventricular block: Secondary | ICD-10-CM

## 2021-01-12 DIAGNOSIS — C181 Malignant neoplasm of appendix: Secondary | ICD-10-CM

## 2021-01-12 DIAGNOSIS — C801 Malignant (primary) neoplasm, unspecified: Secondary | ICD-10-CM

## 2021-01-12 LAB — URINALYSIS DIPSTICK
GLUCOSE,UA: NEGATIVE
NITRITE: NEGATIVE
URINE BILE: NEGATIVE
URINE KETONE: NEGATIVE
URINE PH: 7 (ref 5.0–8.0)
URINE SPEC GRAVITY: 1 (ref 1.005–1.030)

## 2021-01-12 MED ORDER — FLUOROURACIL IV AMB PUMP
2400 mg/m2 | Freq: Once | INTRAVENOUS | 0 refills | Status: CP
Start: 2021-01-12 — End: ?
  Administered 2021-01-12 (×4): 3624 mg via INTRAVENOUS

## 2021-01-12 MED ORDER — SODIUM CHLORIDE 0.9 % IV SOLP
1000 mL | Freq: Once | INTRAVENOUS | 0 refills | Status: CP
Start: 2021-01-12 — End: ?
  Administered 2021-01-12: 15:00:00 1000 mL via INTRAVENOUS

## 2021-01-12 MED ORDER — IRINOTECAN IVPB
130 mg/m2 | Freq: Once | INTRAVENOUS | 0 refills | Status: CP
Start: 2021-01-12 — End: ?
  Administered 2021-01-12 (×2): 196.4 mg via INTRAVENOUS

## 2021-01-12 MED ORDER — ONDANSETRON 8 MG PO TBDI
8 mg | ORAL_TABLET | ORAL | 3 refills | 8.00000 days | Status: AC | PRN
Start: 2021-01-12 — End: ?

## 2021-01-12 MED ORDER — PALONOSETRON 0.25 MG/5 ML IV SOLN
.25 mg | Freq: Once | INTRAVENOUS | 0 refills | Status: CP
Start: 2021-01-12 — End: ?
  Administered 2021-01-12: 15:00:00 0.25 mg via INTRAVENOUS

## 2021-01-12 MED ORDER — DEXAMETHASONE 6 MG PO TAB
12 mg | Freq: Once | ORAL | 0 refills | Status: CP
Start: 2021-01-12 — End: ?
  Administered 2021-01-12: 15:00:00 12 mg via ORAL

## 2021-01-12 MED ORDER — DIPHENOXYLATE-ATROPINE 2.5-0.025 MG PO TAB
1 | ORAL_TABLET | Freq: Four times a day (QID) | ORAL | 3 refills | 15.00000 days | Status: AC | PRN
Start: 2021-01-12 — End: ?

## 2021-01-12 MED ORDER — FLUOROURACIL IV AMB PUMP
2400 mg/m2 | Freq: Once | INTRAVENOUS | 0 refills
Start: 2021-01-12 — End: ?

## 2021-01-12 MED ORDER — BEVACIZUMAB-AWWB IVPB
5 mg/kg | Freq: Once | INTRAVENOUS | 0 refills | Status: CP
Start: 2021-01-12 — End: ?
  Administered 2021-01-12 (×2): 260.5 mg via INTRAVENOUS

## 2021-01-12 MED ORDER — ATROPINE 0.4 MG/ML IJ SOLN
0.4 mg | INTRAVENOUS | 0 refills | Status: AC | PRN
Start: 2021-01-12 — End: ?
  Administered 2021-01-12: 16:00:00 0.4 mg via INTRAVENOUS

## 2021-01-12 NOTE — Progress Notes
CHEMO NOTE  Verified chemo consent signed and in chart.    Blood return positive via: Port (Single and Power Port)    BSA and dose double checked (agree with orders as written) with: yes see MAR    Labs/applicable tests checked: CBC and Comprehensive Metabolic Panel (CMP)    Chemo regimen: Drug/cycle/day: C3D1   -bevacizumab-awwb (MVASI) 260.5 mg in sodium chloride 0.9% (NS) 110.42 mL IVPB   -irinotecan (CAMPTOSAR) 196.4 mg in dextrose 5% (D5W) 509.82 mL IVPB     -fluorouraciL (ADRUCIL) 3,624 mg in sodium chloride 0.9% (NS) 92 mL IV amb pump     Rate verified and armband double check with second RN: yes    Patient education offered and stated understanding. Denies questions at this time.      Patient arrived to [place] treatment for C3D1 of MVASI + Folfiri. Patient has no question, concerns or complaints at this time. 6 Pt received treatment as ordered without incidences. Pt's PAC was flushed and good blood return noted prior to CADD pump hook up. CADD pump infusing per orders on MAR. Pt left ambulatory with no further questions.

## 2021-01-12 NOTE — Patient Instructions
Dr. Blanca Friend MD Medical Oncologist specializing in [place] APRN    Nurses: Melany Guernsey RN BSN and Yehuda Savannah RN BSN. Phone: [phone] Fax: [phone], available Monday-Friday 8:00am- 4:00pm    On call number for urgent needs outside of business hours [phone]- ask for the On call Oncology fellow to be paged and they will call you back.    Medication refills: please contact your pharmacy for medication refills, if they do not have any refills on file they will contact the office, make sure they have our correct contact information on file   P: [phone], F: [phone]    MyChart Messages: All mychart messages are answered by the nurses, even if you select to send the message to Dr. Eldred Manges or Junie Panning.  We often communicate with Dr. Eldred Manges and Junie Panning on how to answer these messages, but all messages will come from the nurses.  Messages are answered 8:00am-4:00pm Monday-Friday, holidays excluded.    Phone Calls: We are hardly ever sitting at our desks where our phone is located as we are in clinic most days during the week. Please leave [place] a message as we check our messages several times per day.  It is our goal to answer these messages as soon as possible.  Messages are answered from 8:00am-4:00 pm Monday-Friday, holidays excluded.  Please make sure you leave your full name with the spelling, date of birth, reason for your call, and return number when leaving a message.

## 2021-01-12 NOTE — Progress Notes
Date of Service: 01/12/2021      Subjective:             Reason for Visit:  Follow Up      Vicki Buchanan is a 65 y.o. 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 02/19/2017 diagnostic laparoscopy with biopsies of the right diaphragm, right liver, right fallopian tube and left pelvis. 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  Plans for exploratory laparotomy with cytoreductive surgery and hyperthermic intraperitoneal chemotherapy in combination with a total abdominal hysterectomy and bilateral salpingo-oophorectomy in Jan. 2019. Developed intestinal obstruction and taken to OR for CRS/HIPEC surgery; however multiple adhesions, omental cracking. CRS/HIPEC was aborted. Immunotherapy on Epacadostat?+ Sirolimus?trial 12/24/2017 - 05/21/2018. She stopped study drug on 05/21/18 for Mitomycin HIPEC trial at [facility] [place].  On 05/28/2018 diagnostic laparoscopy, evacuation of mucinous ascites 1.5 L, peritoneal biopsy, laparoscopic HIPEC with mitomycin-C in [place] by Dr. Daryl Eastern.  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  Completed second laparoscopic HIPEC on June 25, 2018. Again 1.5L removed.  09/12/18 She underwent debulking at [facility] in [place], [place]. Removed 98% by Dr. [name]. Discharged on 09/30/18. She had L. pleural effusion requiring chest tube drainage; intra-abd-abscess in Aug; discharged on 11/04/18 with drain and IV Cefipeme and Diflucan. Her ureteral stents were removed and ostomy relocated in Aug 2020. Also her ostomy was relocated. Hospitalized in Nov-2020 for AKI 2/2 dehydration and likely PE (V/Q scan and Top elevation). CEA increasing again. Repeat CT c/a/p 03/20/19 with stable to slight increase in cancer and RUL nodule. Discussed observation vs treatment. She chose observation and is considering repeat CRS + HIPEC via Dr. [name] in [place].CT scans from 09/24/2019 of the abdomen, without contrast, appear stable. The peritoneal thickening, carcinomatosis, retroperitoneal lymph nodes, a small amount of fluid collection or trace ascites, appear to be stable compared to 3 months ago. s/p RT to rt lung met on 05/21/20. CT scan on 08/17/2020 shows a right lung mass that is slightly increased from 11 mm to 14 mm. There are some new lung nodules that have developed, a small 1 in the left lung, a new nodule in the right lung. Inside the abdomen we are seeing continued increase in size of the peritoneal nodules. The largest measuring 14 mm compared to 12 mm. The nodule lower in the pelvis increased from 16 mm to 21 mm. 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 TMB = 1.3. s/p CRS w/ ostomy revision and tumor removal around stoma on 10/14/20. Post-op course complicated by high output ileostomy, anemia 2/2 acute blood loss and chronic, stage 3a CKD, and leukocytosis. [name] PA from [facility] cancer center in [place] called to relay their TB recommendations for patient.  Recommendations to proceed with folfiri/avastin with holding avastin 6-8 post op. They also request if our office can repeat genomic testing. Repeat TEMPUS from 10/14/20. Pre-screen for KRAS G12D inhibitor trial, if not eligible will plan for FOLFIRI +/- Bevacizumab        Interval Hx 01/12/21   Pt had a lot of diarrhea and N/V after day 5, lasting about a week. She had fluids daily and her creatinine went up. She had terrible heart burn and vomited green emesis. Others in the family also had these symptoms, so unsure if chemo related or a GI bug. She took imodium and tincture of opium but no improvement. She does better with diet  modifications.  She wants to take a trip in January.        Review of Systems   Constitutional: Positive for fatigue and unexpected weight change. Negative for activity change, appetite change, chills, diaphoresis and fever.   HENT: Negative for facial swelling, mouth sores, sore throat and trouble swallowing.    Eyes: Negative.  Negative for visual disturbance.   Respiratory: Negative.  Negative for cough, chest tightness, shortness of breath and wheezing.    Cardiovascular: Negative.  Negative for chest pain, palpitations and leg swelling.   Gastrointestinal: Positive for diarrhea, nausea and vomiting. Negative for abdominal distention, abdominal pain, anal bleeding, blood in stool, constipation and rectal pain.   Genitourinary: Negative.  Negative for difficulty urinating and hematuria.   Musculoskeletal: Negative.  Negative for arthralgias, back pain, neck pain and neck stiffness.   Skin: Negative for color change, pallor, rash and wound.   Neurological: Negative for dizziness, weakness, light-headedness, numbness and headaches.   Hematological: Does not bruise/bleed easily.   Psychiatric/Behavioral: Negative for agitation. The patient is nervous/anxious.        Medical History:   Diagnosis Date   ? Bundle branch block    ? Cancer [facility]    ? Cancer of appendix [facility])      Surgical History:   Procedure Laterality Date   ? CESAREAN SECTION  1981   ? HX CHOLECYSTECTOMY  1995   ? LAPAROSCOPY  02/2017    biopsy peritoneal   ? COLONOSCOPY DIAGNOSTIC WITH SPECIMEN COLLECTION BY BRUSHING/ WASHING - FLEXIBLE N/A 03/19/2017    Performed by Everardo All, MD at [facility] ENDO   ? COLONOSCOPY WITH BIOPSY - FLEXIBLE  03/19/2017    Performed by Everardo All, MD at [facility] ENDO   ? EXPLORATORY LAPAROTOMY, DIVERTING LOOP ILEOSTOMY N/A 04/03/2017    Performed by Loralie Champagne, MD at [facility] OR   ? INSERTION TUNNELED CENTRAL VENOUS CATHETER - AGE 28 YEARS AND OVER Left 04/23/2017    Performed by Loralie Champagne, MD at [facility] OR   ? CYSTOURETHROSCOPY WITH INDWELLING URETERAL STENT INSERTION Bilateral 04/09/2018    Performed by Earl Many, MD at [facility] OR   ? CYSTOURETHROSCOPY WITH URETERAL CATHETERIZATION WITH/ WITHOUT IRRIGATION/ INSTILLATION/ URETEROPYELOGRAPHY Bilateral 04/09/2018    Performed by Earl Many, MD at [facility] OR   ? CYSTOURETHROSCOPY WITH INDWELLING URETERAL STENT EXCHANGE (RIGHT 6 x 26cm / LEFT 6 x 28cm) Bilateral 08/08/2018    Performed by Earl Many, MD at [facility] OR   ? RETROGRADE UROGRAPHY WITH/ WITHOUT KUB Bilateral 08/08/2018    Performed by Earl Many, MD at [facility] OR   ? ILEOSTOMY OR JEJUNOSTOMY Right    ? TUNNELED VENOUS PORT PLACEMENT Left    ? URETER STENT PLACEMENT Bilateral      Family History   Problem Relation Age of Onset   ? Diabetes Mother    ? Cancer Father    ? Diabetes Brother    ? Cancer-Lung Paternal Aunt    ? Cancer-Breast Maternal Grandmother    ? Diabetes Paternal Grandmother      Social History     Socioeconomic History   ? Marital status: Married   Tobacco Use   ? Smoking status: Never Smoker   ? Smokeless tobacco: Never Used   Vaping Use   ? Vaping Use: Never used   Substance and Sexual Activity   ? Alcohol use: No     Comment: rarely   ? Drug use: No  Objective:         ? Cetirizine (ZYRTEC) 10 mg cap Take 10 mg by mouth as Needed.   ? dexAMETHasone (DECADRON) 4 mg tablet 1 tab by mouth with breakfast and afternoon snack on days 2 & 3 following each chemotherapy cycle. Do not take after 6 pm to avoid insomnia.   ? famotidine (PEPCID AC) 10 mg tablet Take one tablet by mouth twice daily.   ? loperamide (IMODIUM A-D) 2 mg capsule Take one pill by mouth four times daily as needed   ? LORazepam (ATIVAN) 0.5 mg tablet Take 1-2 tabs by mouth every 6 hrs as needed N/V not controlled by Zofran or Compazine. May also use every 6 hrs as needed anxiety or at bedtime insomnia.   ? magnesium citrate 200 mg tab Take 1 tablet by mouth daily.   ? MULTIVITAMIN PO Take 1 tablet by mouth daily.   ? ondansetron (ZOFRAN ODT) 8 mg rapid dissolve tablet Dissolve 8 mg by mouth every 8 hours as needed for Nausea or Vomiting. Place on tongue to disolve.   ? ondansetron HCL (ZOFRAN) 8 mg tablet Take one tablet by mouth every 8 hours as needed for Nausea or Vomiting.   ? opium tincture oral solution Take 0.6 mL by mouth every 6 hours.   ? prochlorperazine maleate (COMPAZINE) 10 mg tablet 1 tablet by mouth every 6 hours as needed for nausea not controlled by Zofran.   ? psyllium husk (METAMUCIL) 0.4 gram cap    ? sodium chloride 0.9% (NS) 0.9 % 1,000 mL with potassium chloride 2 mEq/mL 20 mEq, magnesium sulfate 4 mEq/mL (50 %) 2 g IV infusion Administer 1L NS with K and 2g Mg once weekly via outpatient infusion.     Vitals:    01/12/21 0737 01/12/21 0739   BP: 119/80    BP Source: Arm, Right Upper    Pulse: 84    Temp: 36.6 ?C (97.9 ?F)    Resp: 16    SpO2: 100%    TempSrc: Temporal    PainSc:  Zero   Weight: 51.6 kg (113 lb 12.8 oz)    Height: 156 cm (5' 1.42)      Body mass index is 21.21 kg/m?Marland Kitchen     Pain Score: Zero         Pain Addressed:  N/A    Patient Evaluated for a Clinical Trial: Patient not eligible for a treatment trial (including not needing treatment, needs palliative care, in remission).     [place] Cooperative Oncology Group performance status is 1, Restricted in physically strenuous activity but ambulatory and able to carry out work of a light or sedentary nature, e.g., light house work, office work.     Physical Exam  Vitals reviewed.   Constitutional:       General: She is not in acute distress.     Appearance: Normal appearance. She is well-developed. She is not ill-appearing, toxic-appearing or diaphoretic.   HENT:      Head: Normocephalic and atraumatic.      Nose: Nose normal. No rhinorrhea.      Mouth/Throat:      Mouth: Mucous membranes are moist. Mucous membranes are not pale. No oral lesions.      Pharynx: Oropharynx is clear. No oropharyngeal exudate or posterior oropharyngeal erythema.      Tonsils: No tonsillar abscesses.   Eyes:      General: No scleral icterus.        Right eye:  No discharge.         Left eye: No discharge.      Extraocular Movements: Extraocular movements intact.      Conjunctiva/sclera: Conjunctivae normal.      Pupils: Pupils are equal, round, and reactive to light.   Cardiovascular:      Rate and Rhythm: Normal rate and regular rhythm.      Pulses: Normal pulses.      Heart sounds: Normal heart sounds. No murmur heard.    No gallop.   Pulmonary:      Effort: Pulmonary effort is normal. No respiratory distress.      Breath sounds: Normal breath sounds. No stridor. No wheezing, rhonchi or rales.   Chest:      Chest wall: No tenderness.      Comments: Port site wnl  Abdominal:      General: Bowel sounds are normal. There is no distension.      Palpations: Abdomen is soft. There is no mass.      Tenderness: There is no abdominal tenderness. There is no guarding or rebound.      Hernia: No hernia is present.       Musculoskeletal:         General: No swelling, tenderness, deformity or signs of injury. Normal range of motion.      Cervical back: Normal range of motion and neck supple. No rigidity. No muscular tenderness.      Right lower leg: No edema.      Left lower leg: No edema.   Lymphadenopathy:      Cervical: No cervical adenopathy.   Skin:     General: Skin is warm and dry.      Coloration: Skin is not jaundiced or pale.      Findings: No bruising, erythema, lesion or rash.   Neurological:      General: No focal deficit present.      Mental Status: She is alert and oriented to person, place, and time. Mental status is at baseline.      Cranial Nerves: No cranial nerve deficit.      Motor: No weakness.      Coordination: Coordination normal.      Gait: Gait normal.   Psychiatric:         Mood and Affect: Mood normal.         Behavior: Behavior normal.         Thought Content: Thought content normal.         Judgment: Judgment normal.            CBC w/DIFF  CBC with Diff Latest Ref Rng & Units 01/12/2021 12/29/2020 12/15/2020 08/17/2020 03/24/2020   WBC 4.5 - 11.0 K/UL 7.5 7.5 7.9 13.5(H) 12.1(H)   RBC 4.0 - 5.0 M/UL 3.22(L) 3.17(L) 3.22(L) 2.75(L) 3.20(L)   HGB 12.0 - 15.0 GM/DL 10.7(L) 10.4(L) 10.5(L) 9.1(L) 10.5(L)   HCT 36 - 45 % 31.4(L) 31.3(L) 31.3(L) 27.4(L) 31.7(L)   MCV 80 - 100 FL 97.5 98.8 97.2 99.9 99.3   MCH 26 - 34 PG 33.2 32.8 32.6 33.2 32.8   MCHC 32.0 - 36.0 G/DL 16.1 09.6 04.5 40.9 81.1   RDW 11 - 15 % 14.1 14.3 15.1(H) 12.9 11.3   PLT 150 - 400 K/UL 479(H) 405(H) 573(H) 629(H) 474(H)   MPV 7 - 11 FL 7.5 7.8 7.2 6.8(L) 7.6   NEUT 41 - 77 % 45 49 56 66 61   ANC 1.8 - 7.0  K/UL 3.40 3.70 4.40 8.90(H) 7.40(H)   LYMA 24 - 44 % 30 30 25  18(L) 23(L)   ALYM 1.0 - 4.8 K/UL 2.20 2.20 2.00 2.50 2.80   MONA 4 - 12 % 19(H) 13(H) 13(H) 12 12   AMONO 0 - 0.80 K/UL 1.40(H) 1.00(H) 1.00(H) 1.60(H) 1.40(H)   EOSA 0 - 5 % 5 6(H) 5 3 3    AEOS 0 - 0.45 K/UL 0.40 0.40 0.40 0.40 0.40   BASA 0 - 2 % 1 2 1 1 1    ABAS 0 - 0.20 K/UL 0.10 0.10 0.10 0.10 0.10     Comprehensive Metabolic Profile  CMP Latest Ref Rng & Units 01/12/2021 12/29/2020 12/15/2020 08/17/2020 03/24/2020   NA 137 - 147 MMOL/L 138 138 137 138 135(L)   K 3.5 - 5.1 MMOL/L 3.8 4.4 4.2 4.6 4.2   CL 98 - 110 MMOL/L 98 101 102 103 100   CO2 21 - 30 MMOL/L 31(H) 28 28 29 27    GAP 3 - 12 9 9 7 6 8    BUN 7 - 25 MG/DL 16(X) 09(U) 24 04(V) 40(J)   CR 0.4 - 1.00 MG/DL 8.11(B) 1.47(W) 2.95(A) 2.02(H) 2.02(H)   GLUX 70 - 100 MG/DL 213(Y) 865(H) 92 846(N) 92   CA 8.5 - 10.6 MG/DL 8.9 9.2 9.4 8.9 9.8   TP 6.0 - 8.0 G/DL 7.0 7.6 8.0 7.6 7.9   ALB 3.5 - 5.0 G/DL 3.7 3.9 3.7 3.3(L) 3.7   ALKP 25 - 110 U/L 425(H) 424(H) 356(H) 254(H) 285(H)   ALT 7 - 56 U/L 66(H) 71(H) 23 33 29   TBILI 0.3 - 1.2 MG/DL 0.4 0.4 0.4 0.4 0.5   GFR >60 mL/min - - - - -   GFRAA >60 mL/min - - - - -       Tumor Markers  Lab Results   Component Value Date    CEA 1,815.4 (H) 12/29/2020    CEA 1,749.5 (H) 12/15/2020    CEA 1,403.1 (H) 08/17/2020    CEA 171.8 (H) 03/24/2020    CEA 1,882.9 (H) 09/24/2019    CEA 672.0 (H) 06/17/2019    CEA 665.4 (H) 02/28/2019    CEA 226.4 (H) 11/13/2018    CEA 3,307.8 (H) 07/17/2018    CEA 4,030.3 (H) 07/10/2018    CA199 <1 12/29/2020    CA199 <1 12/15/2020    CA199 <1 06/25/2017    CA199 <1 06/11/2017    CA199 <1 05/28/2017 CA199 <1 05/14/2017    CA125 41 (H) 01/21/2018    CA125 42 (H) 11/21/2017    CA125 46 (H) 11/05/2017    CA125 41 (H) 10/22/2017    CA125 34 10/08/2017    CA125 34 09/24/2017    CA125 31 09/10/2017    CA125 29 08/27/2017    CA125 34 08/15/2017    CA125 39 (H) 07/30/2017        Assessment and Plan:    1. Metastatic appendiceal carcinoma. 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   02/19/2017 diagnostic laparoscopy with biopsies of the right diaphragm, right liver, right fallopian tube and left pelvis. 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  Plans for exploratory laparotomy with cytoreductive surgery and hyperthermic intraperitoneal chemotherapy in combination with a total abdominal hysterectomy and bilateral salpingo-oophorectomy in Jan. 2019. Developed intestinal obstruction and taken to OR for CRS/HIPEC surgery; however multiple adhesions, omental cracking. CRS/HIPEC was aborted. Immunotherapy on Epacadostat?+ Sirolimus?trial 12/24/2017 - 05/21/2018. She stopped study  drug on 05/21/18 for Mitomycin HIPEC trial at [facility] [place].  On 05/28/2018 diagnostic laparoscopy, evacuation of mucinous ascites 1.5 L, peritoneal biopsy, laparoscopic HIPEC with mitomycin-C in [place] by Dr. Daryl Eastern.  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  Completed second laparoscopic HIPEC on June 25, 2018. Again 1.5L removed. 09/12/18 She underwent debulking at [facility] in [place], [place]. Removed 98% by Dr. [name]. Discharged on 09/30/18. She had L. pleural effusion requiring chest tube drainage; intra-abd-abscess in Aug; discharged on 11/04/18 with drain and IV Cefipeme and Diflucan. Her ureteral stents were removed and ostomy relocated in Aug 2020. Also her ostomy was relocated. Hospitalized in Nov-2020 for AKI 2/2 dehydration and likely PE (V/Q scan and Top elevation). CEA increasing again. Repeat CT c/a/p 03/20/19 with stable to slight increase in cancer and RUL nodule. Discussed observation vs treatment. She chose observation and is considering repeat CRS + HIPEC via Dr. [name] in [place].CT scans from 09/24/2019 of the abdomen, without contrast, appear stable. The peritoneal thickening, carcinomatosis, retroperitoneal lymph nodes, a small amount of fluid collection or trace ascites, appear to be stable compared to 3 months ago. s/p RT to rt lung met on 05/21/20. CT scan on 08/17/2020 shows a right lung mass that is slightly increased from 11 mm to 14 mm. There are some new lung nodules that have developed, a small 1 in the left lung, a new nodule in the right lung. Inside the abdomen we are seeing continued increase in size of the peritoneal nodules. The largest measuring 14 mm compared to 12 mm. The nodule lower in the pelvis increased from 16 mm to 21 mm. 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 TMB = 1.3. s/p CRS w/ ostomy revision and tumor removal around stoma on 10/14/20. Post-op course complicated by high output ileostomy, anemia 2/2 acute blood loss and chronic, stage 3a CKD, and leukocytosis. [name] PA from [facility] cancer center in [place] called to relay their TB recommendations for patient.  Recommendations to proceed with folfiri/avastin with holding avastin 6-8 post op. They also request if our office can repeat genomic testing. Repeat TEMPUS from 10/14/20. Pre-screen for KRAS G12D inhibitor trial, if not eligible will plan for FOLFIRI +/- Bevacizumab    Plan 01/12/21   Tolerated FOLFIRI cycle 1 but more diarrhea and N/V after cycle 2. Also had GI virus, as did the rest of the family.   She will get her pump disconnected in [place] at the local infusion center.  She will continue IV fluids on Mondays, Wednesdays and Fridays.  We will give her IV fluids when she is here for chemotherapy.  She will continue her magnesium supplement and we will monitor.  Decrease irinotecan to 130 mg/m2 due to diarrhea.  Add bevacizumab with this cycle  Return to clinic in 2 weeks for cycle 4  Repeat imaging after 4 cycles, but delay it a week due to Thanksgiving.   Continue supportive care    2. Elevated creatinine, S/P ureteral stent. Her ureteral stents were removed in Aug 2020. Cr. 1.79. Encouraged hydration. S/p IVF 1L NS and 1g Mag weekly x 4 weeks.   Refer to Nephrology as Cr is improving with IVF.  She is following with Dr. Marjie Skiff in nephrology. 2.48 creatinine after GI virus. Decrease irinotecan to 130 mg/m2.  Continue IV fluids 3 times a week locally, as we anticipate more diarrhea with irinotecan.    Increased ostomy output.  She is able to control  with Imodium 3 times a day and as needed Metamucil.  When she took Metamucil more than once a day she had some signs of constipation.  Refill Imodium today.  Monitor closely with irinotecan.  Continue IV fluid support and following with nephrology to maintain kidney function. Decrease irinotecan, add tincture of opium. Magnesium replacement closer to home for mag of 1.4. IVF today. Add lomotil.    Hypomagnesemia. 1.4 today. Replace closer to home as needed. Will try holding oral replacement, as this might add to diarrhea.     Nausea. From chemo. Then worse last week with possible GI virus. Try compazine. Refill zofran ODT. Decrease dose, continue antiemetics.     Turner Daniels, APRN-NP        The patient and family were allowed to ask questions and voice concerns; these were addressed to the best of our ability. They expressed understanding of what was explained to them, and they agreed with the present plan. RTC in 2 weeks with labs to see a provider. Patient has the phone numbers for the Cancer Center and was instructed on how to contact us with any questions or concerns. My collaborating physician on this case is Dr. Josiah Lobo.

## 2021-01-17 ENCOUNTER — Encounter: Admit: 2021-01-17 | Discharge: 2021-01-17 | Payer: MEDICARE

## 2021-01-17 NOTE — Telephone Encounter
This RN called patient to check on her. Patient's PCP prescribed antibiotics for her. She is currently on her fourth day of ciprofloxacin. She stated she was not having any symptoms when she was prescribed antibiotics. She will have her urine checked in 2 weeks with PCP.     She will call us if she develops symptoms. She denied questions or need for anything else at this time.

## 2021-01-17 NOTE — Telephone Encounter
-----   Message from Ambrose Mantle, APRN-NP sent at 01/13/2021 12:38 PM CDT -----  Can someone please call and see if she has any UTI symptoms?  The UA for avastin showed some leukocytes, blood, turbidity. She didn't say this in her appointment yesterday.   Thanks,  Junie Panning  ----- Message -----  From: Interface, In Results Misys  Sent: 01/12/2021  10:24 AM CDT  To: Ambrose Mantle, APRN-NP

## 2021-01-26 ENCOUNTER — Encounter: Admit: 2021-01-26 | Discharge: 2021-01-26 | Payer: MEDICARE

## 2021-01-26 DIAGNOSIS — C786 Secondary malignant neoplasm of retroperitoneum and peritoneum: Secondary | ICD-10-CM

## 2021-01-26 DIAGNOSIS — C181 Malignant neoplasm of appendix: Secondary | ICD-10-CM

## 2021-01-26 DIAGNOSIS — I454 Nonspecific intraventricular block: Secondary | ICD-10-CM

## 2021-01-26 DIAGNOSIS — C801 Malignant (primary) neoplasm, unspecified: Secondary | ICD-10-CM

## 2021-01-26 MED ORDER — BEVACIZUMAB-AWWB IVPB
5 mg/kg | Freq: Once | INTRAVENOUS | 0 refills | Status: CP
Start: 2021-01-26 — End: ?
  Administered 2021-01-26 (×2): 260.5 mg via INTRAVENOUS

## 2021-01-26 MED ORDER — IRINOTECAN IVPB
130 mg/m2 | Freq: Once | INTRAVENOUS | 0 refills | Status: CP
Start: 2021-01-26 — End: ?
  Administered 2021-01-26 (×2): 196.4 mg via INTRAVENOUS

## 2021-01-26 MED ORDER — ATROPINE 0.4 MG/ML IJ SOLN
0.4 mg | INTRAVENOUS | 0 refills | Status: AC | PRN
Start: 2021-01-26 — End: ?
  Administered 2021-01-26: 21:00:00 0.4 mg via INTRAVENOUS

## 2021-01-26 MED ORDER — PALONOSETRON 0.25 MG/5 ML IV SOLN
.25 mg | Freq: Once | INTRAVENOUS | 0 refills | Status: CP
Start: 2021-01-26 — End: ?
  Administered 2021-01-26: 20:00:00 0.25 mg via INTRAVENOUS

## 2021-01-26 MED ORDER — SODIUM CHLORIDE 0.9 % IV SOLP
1000 mL | Freq: Once | INTRAVENOUS | 0 refills | Status: CP
Start: 2021-01-26 — End: ?
  Administered 2021-01-26: 20:00:00 1000 mL via INTRAVENOUS

## 2021-01-26 MED ORDER — FLUOROURACIL IV AMB PUMP
2400 mg/m2 | Freq: Once | INTRAVENOUS | 0 refills | Status: CP
Start: 2021-01-26 — End: ?
  Administered 2021-01-26 (×3): 3624 mg via INTRAVENOUS

## 2021-01-26 MED ORDER — DEXAMETHASONE 6 MG PO TAB
12 mg | Freq: Once | ORAL | 0 refills | Status: CP
Start: 2021-01-26 — End: ?
  Administered 2021-01-26: 20:00:00 12 mg via ORAL

## 2021-01-26 NOTE — Progress Notes
CHEMO NOTE  Verified chemo consent signed and in chart.    Blood return positive via: Port (Single and Power Port)    BSA and dose double checked (agree with orders as written) with: yes     Labs/applicable tests checked: CBC and Comprehensive Metabolic Panel (CMP)    Chemo regimen: C4D1    bevacizumab-awwb (MVASI) 260.5 mg in sodium chloride 0.9% (NS) 110.42 mL IVPB    irinotecan (CAMPTOSAR) 196.4 mg in dextrose 5% (D5W) 509.82 mL IVPB    fluorouraciL (ADRUCIL) 3,624 mg in sodium chloride 0.9% (NS) 92 mL IV amb pump    Rate verified and armband double check with second RN: yes    Patient education offered and stated understanding. Denies questions at this time.      Patient arrived to [place] treatment after being seen in clinic; please refer to clinic note for assessment details. Premeds given per treatment plan. One dose of atropine PRN given prior to irinotecan infusion per pt request. Mvasi and irinotecan given w/o complications, patient tolerated well. Port positive for blood return, flushed with saline, and currently infusing 5FU via ambulatory pump. All connections secure, unclamped, and face of pump reads infusing. Patient declined copy of labs and AVS. All questions and concerns addressed. Patient left CC treatment in stable condition.

## 2021-01-26 NOTE — Progress Notes
Name: Vicki Buchanan          MRN: [id number]      DOB: 1955-11-09      AGE: 65 y.o.   DATE OF SERVICE: 01/26/2021    Subjective:             Reason for Visit:  Cancer Follow up      Vicki Buchanan is a 65 y.o. female.       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      She was seen by her PCP on 02/14/2017 with intermittent but recurrent abdominal cramping, bloating and diarrhea. Patient reports 6-7 months ago she began having episodes of abdominal cramping that progressed in severity until it caused her to vomit multiple times until it was dry heaving. This was followed by diarrhea the next morning. Patient continued to have abdominal cramping and bloating with alternating watery diarrhea and constipation.  Upon examination she was noted decreased bowel sounds, mass (multiple firm lesions noted on deep palpation to RLQ, mid upper quadrant, and LLQ, non tender to palpation). She is tender with firm mass noted RUQ, LUQ and LLQ. A CT scan was done on 02/14/2017 that demonstrated large volume of abdominopelvic ascites, omental caking, evidence of pelvic peritoneal  enhancing nodularity suspicious for peritoneal carcinomatosis, and hepatic tumor implantation. Findings are most suspicious  for underling ovarian or gastrointestinal neoplasm.     She was then seen by a General Surgeon and on 02/19/2017 she underwent diagnostic laparoscopy with biopsies of right diaphragm, right liver, right fallopian tube and left pelvis. Pathology showed involvement by mucinous neoplasm with low-grade features in all four biopsy specimens. CA 125 on 02/27/2017 was 90.4 and CEA is 1621.0.    Findings at the time of surgery on 02/19/17 by Dr. Jonah Blue: A total of 1.7 L of fluid was removed and the peritoneal cavity.  It was slightly turbid and hemorrhagic in color.  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  There was bulky disease in the pelvis, scattered non-bulky disease of the right hemidiaphragm and on the capsule liver.  Left hemidiaphragm appear to be spared.  On the parietal peritoneum it was started with areas suspicious for carcinomatosis.  The stomach was observed.  There were no evident masses that could be discerned externally.  There was bulky disease in the pelvis over the bladder and a large, what appeared to be, right tubo-ovarian mass with heavy disposition of tumor in that area.  She had also an area of the right fimbria of the fallopian tube with a large to positive tumor.  A portion of the right fallopian tube and fimbria were removed with ligature device.  Biopsies left pelvis were taken of the left pelvic sidewall.  Biopsies were also taken of the serosal surface of the liver and the right hemidiaphragm.     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 This was low grade mucinous neoplasm which was focally positive for CK7, and negative for PAX-8. Cytology from abdominal fluid was negative for malignancy.     She was taken to the OR on 04/03/2017 for potential cytoreductive surgery and hyperthermic intraperitoneal chemotherapy (CRS and HIPEC). 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  The process involved the entire mesentery of the small and large bowel as well as the root of the mesentery.  Almost all of the peritoneal surfaces had disease.  Unfortunately, the mesenteric disease was infiltrative to an extent where it would have required too much of bowel resection in order to remove the disease. A diverting loop ileostomy was performed to help alleviate obstructive symptoms. Pathology described mucinous carcinoma with low-grade morphology and occasional high-grade areas (G2 and focal G3 with focal signet ring cells).    She received FOLFOX + Avastin since February-September 2019 under the direction of Dr. Josiah Lobo. She began a clinical trial in Sept 2019 with SIROLIMUS + EPACADOSTAT.    She stopped the study drug on 05/21/18 as she chose to enroll in the Mitomycin HIPEC trial at [facility] and completed her first laparoscopic HIPEC on 05/28/18 and second laparoscopic HIPEC on 06/25/18.     She continued to have significant pelvic discomfort and urinary frequency due to the large pelvic mass. She sought an opinion from Dr. [name] in [place], [place]. On 09/12/2018, she underwent an exp Lap, cytoreductive debulking surgery for metastatic mucinous adenocarcinoma of the appendix, total colectomy, splenectomy, omentectomy, TAH/BSO and HIPEC with Mitomycin-C. She underwent a CC-3 cytoreduction with significant amount of visible and palpable disease remaining.    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    Had debulking of the peristomal tumor in July 2022 by Dr. [name].    Started back on maintenance 5-FU, irinotecan and bevacizumab on 12/14/2020.      Vicki Buchanan presents today for continued surveillance with our office. She is doing well since the tumor was debulked around the stoma. Had some bag fitting issues at first but is now doing well. She continues to receive IV fluids three times weekly. She is eating well. Continues to have high output from the ileostomy but is managing ok with current regimen.    Interval labs and imaging reviewed. She is due for repeat imaging at the end of this month.       Review of Systems   Constitutional: Positive for fatigue. Negative for activity change, appetite change, chills, diaphoresis, fever and unexpected weight change.   HENT: Negative for facial swelling, mouth sores, sore throat and trouble swallowing.    Eyes: Negative.  Negative for visual disturbance.   Respiratory: Negative.  Negative for cough, chest tightness, shortness of breath and wheezing. Cardiovascular: Negative.  Negative for chest pain, palpitations and leg swelling.   Gastrointestinal: Positive for diarrhea, nausea and vomiting. Negative for abdominal distention, abdominal pain, anal bleeding, blood in stool, constipation and rectal pain.   Genitourinary: Negative.  Negative for difficulty urinating and hematuria.   Musculoskeletal: Negative.  Negative for arthralgias, back pain, neck pain and neck stiffness.   Skin: Negative for color change, pallor, rash and wound.   Neurological: Negative for dizziness, weakness, light-headedness, numbness and headaches.   Hematological: Does not bruise/bleed easily.   Psychiatric/Behavioral: Negative for agitation. The patient is nervous/anxious.          Objective:         ? Cetirizine (ZYRTEC) 10 mg cap Take 10 mg by mouth as Needed.   ? dexAMETHasone (DECADRON) 4 mg tablet 1 tab by mouth with breakfast and afternoon snack on days 2 & 3 following each chemotherapy cycle. Do not take after 6 pm to avoid insomnia.   ? diphenoxylate-atropine (LOMOTIL) 2.5-0.025 mg tablet Take one tablet by mouth four times daily as needed for Diarrhea.   ? famotidine (PEPCID AC) 10 mg  tablet Take one tablet by mouth twice daily.   ? loperamide (IMODIUM A-D) 2 mg capsule Take one pill by mouth four times daily as needed   ? LORazepam (ATIVAN) 0.5 mg tablet Take 1-2 tabs by mouth every 6 hrs as needed N/V not controlled by Zofran or Compazine. May also use every 6 hrs as needed anxiety or at bedtime insomnia.   ? magnesium citrate 200 mg tab Take 1 tablet by mouth daily.   ? MULTIVITAMIN PO Take 1 tablet by mouth daily.   ? ondansetron (ZOFRAN ODT) 8 mg rapid dissolve tablet Dissolve one tablet by mouth every 8 hours as needed for Nausea or Vomiting. Place on tongue to disolve.   ? opium tincture oral solution Take 0.6 mL by mouth every 6 hours.   ? prochlorperazine maleate (COMPAZINE) 10 mg tablet 1 tablet by mouth every 6 hours as needed for nausea not controlled by Zofran. ? psyllium husk (METAMUCIL) 0.4 gram cap    ? sodium chloride 0.9% (NS) 0.9 % 1,000 mL with potassium chloride 2 mEq/mL 20 mEq, magnesium sulfate 4 mEq/mL (50 %) 2 g IV infusion Administer 1L NS with K and 2g Mg once weekly via outpatient infusion.     Vitals:    01/26/21 1203 01/26/21 1205   Resp: 16    PainSc:  Zero   Weight: 52.6 kg (116 lb)      Body mass index is 21.62 kg/m?Marland Kitchen     Pain Score: Zero       Fatigue Scale: 0-None    Pain Addressed:  N/A    Patient Evaluated for a Clinical Trial: No treatment clinical trial available for this patient.     [place] Cooperative Oncology Group performance status is 1, Restricted in physically strenuous activity but ambulatory and able to carry out work of a light or sedentary nature, e.g., light house work, office work.     Physical Exam  Vitals reviewed.   Constitutional:       Appearance: She is well-developed.   HENT:      Head: Normocephalic and atraumatic.   Abdominal:       Musculoskeletal:         General: Normal range of motion.   Skin:     General: Skin is warm and dry.   Neurological:      Mental Status: She is alert and oriented to person, place, and time.   Psychiatric:         Behavior: Behavior normal.         Thought Content: Thought content normal.         Judgment: Judgment normal.               Assessment and Plan:  Appendiceal adenocarcinoma  Peritoneal carcinomatosis  - She is doing well on maintenance therapy at this time.  - Continue medical oncology care with Dr. Josiah Lobo, who manages labs and imaging  - RTC 6 months to maintain continuity with our office in case any local needs arise    Lilian Kapur, PA-C    Collaborating physician: Loralie Champagne, MD

## 2021-01-26 NOTE — Patient Instructions
Dr. Anup Kasi MD Medical Oncologist specializing in Gastro-Intestinal malignancies   Erin Carroll APRN    Nurses: Kristen McKenzie RN BSN and Maddie Umeka Wrench RN BSN. Phone: [phone] Fax: [phone], available Monday-Friday 8:00am- 4:00pm. After your new patient visit, we will be listening into the clinic appointments via Teams on the iPad. This is the best way to make sure we hear your concerns, the provider's recommendations and do the behind the scenes work to give you the best care.      On call number for urgent needs outside of business hours [phone]- ask for the On call Oncology fellow to be paged and they will call you back.    Medication refills: please contact your pharmacy for medication refills, if they do not have any refills on file they will contact the office, make sure they have our correct contact information on file   P: [phone], F: [phone]    MyChart Messages: All mychart messages are answered by the nurses, even if you select to send the message to Dr. Kasi or Erin.  We often communicate with Dr. Kasi and Erin on how to answer these messages, but all messages will come from the nurses.  Messages are answered 8:00am-4:00pm Monday-Friday, holidays excluded.    Phone Calls: We are hardly ever sitting at our desks where our phone is located as we are in clinic most days during the week. Please leave us a message as we check our messages several times per day.  It is our goal to answer these messages as soon as possible.  Messages are answered from 8:00am-4:00 pm Monday-Friday, holidays excluded.  Please make sure you leave your full name with the spelling, date of birth, reason for your call, and return number when leaving a message.

## 2021-02-08 ENCOUNTER — Encounter: Admit: 2021-02-08 | Discharge: 2021-02-08 | Payer: MEDICARE

## 2021-02-08 MED ORDER — PALONOSETRON 0.25 MG/5 ML IV SOLN
.25 mg | Freq: Once | INTRAVENOUS | 0 refills
Start: 2021-02-08 — End: ?

## 2021-02-08 MED ORDER — DEXAMETHASONE 6 MG PO TAB
12 mg | Freq: Once | ORAL | 0 refills
Start: 2021-02-08 — End: ?

## 2021-02-08 MED ORDER — FLUOROURACIL IV AMB PUMP
2400 mg/m2 | Freq: Once | INTRAVENOUS | 0 refills
Start: 2021-02-08 — End: ?

## 2021-02-08 MED ORDER — ATROPINE 0.4 MG/ML IJ SOLN
0.4 mg | INTRAVENOUS | 0 refills | PRN
Start: 2021-02-08 — End: ?

## 2021-02-16 ENCOUNTER — Encounter: Admit: 2021-02-16 | Discharge: 2021-02-16 | Payer: MEDICARE

## 2021-02-16 DIAGNOSIS — C786 Secondary malignant neoplasm of retroperitoneum and peritoneum: Principal | ICD-10-CM

## 2021-02-16 DIAGNOSIS — C181 Malignant neoplasm of appendix: Secondary | ICD-10-CM

## 2021-02-16 DIAGNOSIS — Z92858 Personal history of other cellular therapy: Secondary | ICD-10-CM

## 2021-02-16 DIAGNOSIS — R5383 Other fatigue: Secondary | ICD-10-CM

## 2021-02-16 DIAGNOSIS — C801 Malignant (primary) neoplasm, unspecified: Secondary | ICD-10-CM

## 2021-02-16 DIAGNOSIS — I454 Nonspecific intraventricular block: Secondary | ICD-10-CM

## 2021-02-16 LAB — RVP VIRAL PANEL PCR: CORONAVIRUS HKU1: DETECTED — AB

## 2021-02-16 LAB — URINALYSIS DIPSTICK
PROTEIN,UA: NEGATIVE
URINE KETONE: NEGATIVE
URINE PH: 6 (ref 5.0–8.0)
URINE SPEC GRAVITY: 1 (ref 1.005–1.030)

## 2021-02-16 LAB — POC CREATININE, RAD: CREATININE, POC: 2.5 mg/dL — ABNORMAL HIGH (ref 0.4–1.00)

## 2021-02-16 MED ORDER — HEPARIN, PORCINE (PF) 100 UNIT/ML IV SYRG
500 [IU] | Freq: Once | 0 refills | Status: CP
Start: 2021-02-16 — End: ?

## 2021-02-16 MED ORDER — SODIUM CHLORIDE 0.9 % IV SOLP
1000 mL | Freq: Once | INTRAVENOUS | 0 refills | Status: CP
Start: 2021-02-16 — End: ?
  Administered 2021-02-16: 19:00:00 1000 mL via INTRAVENOUS

## 2021-02-16 NOTE — Progress Notes
C5D1 Patient arrived to [place] treatment for IVF. Treatment held per provider. PAC flushed with normal saline, good blood return. Pt tolerated infusion without incident. Port flushed per protocol and deaccessed. Pt left ambulatory, no further complaints.

## 2021-02-17 ENCOUNTER — Encounter: Admit: 2021-02-17 | Discharge: 2021-02-17 | Payer: MEDICARE

## 2021-02-22 ENCOUNTER — Encounter: Admit: 2021-02-22 | Discharge: 2021-02-22 | Payer: MEDICARE

## 2021-02-22 DIAGNOSIS — C801 Malignant (primary) neoplasm, unspecified: Secondary | ICD-10-CM

## 2021-02-22 DIAGNOSIS — C181 Malignant neoplasm of appendix: Secondary | ICD-10-CM

## 2021-02-22 DIAGNOSIS — I454 Nonspecific intraventricular block: Secondary | ICD-10-CM

## 2021-02-24 ENCOUNTER — Encounter: Admit: 2021-02-24 | Discharge: 2021-02-24 | Payer: MEDICARE

## 2021-02-24 MED ORDER — PALONOSETRON 0.25 MG/5 ML IV SOLN
.25 mg | Freq: Once | INTRAVENOUS | 0 refills
Start: 2021-02-24 — End: ?

## 2021-02-24 MED ORDER — DEXAMETHASONE 6 MG PO TAB
12 mg | Freq: Once | ORAL | 0 refills
Start: 2021-02-24 — End: ?

## 2021-02-24 MED ORDER — FLUOROURACIL IV AMB PUMP
2400 mg/m2 | Freq: Once | INTRAVENOUS | 0 refills
Start: 2021-02-24 — End: ?

## 2021-02-24 MED ORDER — ATROPINE 0.4 MG/ML IJ SOLN
0.4 mg | INTRAVENOUS | 0 refills | PRN
Start: 2021-02-24 — End: ?

## 2021-03-02 ENCOUNTER — Encounter: Admit: 2021-03-02 | Discharge: 2021-03-02 | Payer: MEDICARE

## 2021-03-02 DIAGNOSIS — I454 Nonspecific intraventricular block: Secondary | ICD-10-CM

## 2021-03-02 DIAGNOSIS — C181 Malignant neoplasm of appendix: Secondary | ICD-10-CM

## 2021-03-02 DIAGNOSIS — C786 Secondary malignant neoplasm of retroperitoneum and peritoneum: Secondary | ICD-10-CM

## 2021-03-02 DIAGNOSIS — C801 Malignant (primary) neoplasm, unspecified: Secondary | ICD-10-CM

## 2021-03-02 MED ORDER — HEPARIN, PORCINE (PF) 100 UNIT/ML IV SYRG
500 [IU] | Freq: Once | 0 refills | Status: CP
Start: 2021-03-02 — End: ?

## 2021-03-02 MED ORDER — BEVACIZUMAB-AWWB IVPB
5 mg/kg | Freq: Once | INTRAVENOUS | 0 refills | Status: CP
Start: 2021-03-02 — End: ?
  Administered 2021-03-02 (×2): 260.5 mg via INTRAVENOUS

## 2021-03-02 MED ORDER — FLUOROURACIL IV AMB PUMP
2400 mg/m2 | Freq: Once | INTRAVENOUS | 0 refills | Status: CP
Start: 2021-03-02 — End: ?
  Administered 2021-03-02 (×3): 3624 mg via INTRAVENOUS

## 2021-03-02 MED ORDER — DEXAMETHASONE 6 MG PO TAB
12 mg | Freq: Once | ORAL | 0 refills | Status: CP
Start: 2021-03-02 — End: ?
  Administered 2021-03-02: 16:00:00 12 mg via ORAL

## 2021-03-02 MED ORDER — IRINOTECAN IVPB
130 mg/m2 | Freq: Once | INTRAVENOUS | 0 refills | Status: CP
Start: 2021-03-02 — End: ?
  Administered 2021-03-02 (×2): 196.4 mg via INTRAVENOUS

## 2021-03-02 MED ORDER — ATROPINE 0.4 MG/ML IJ SOLN
0.4 mg | INTRAVENOUS | 0 refills | Status: AC | PRN
Start: 2021-03-02 — End: ?
  Administered 2021-03-02: 17:00:00 0.4 mg via INTRAVENOUS

## 2021-03-02 MED ORDER — SODIUM CHLORIDE 0.9 % IV SOLP
1000 mL | Freq: Once | INTRAVENOUS | 0 refills | Status: CP
Start: 2021-03-02 — End: ?
  Administered 2021-03-02: 16:00:00 1000 mL via INTRAVENOUS

## 2021-03-02 MED ORDER — PALONOSETRON 0.25 MG/5 ML IV SOLN
.25 mg | Freq: Once | INTRAVENOUS | 0 refills | Status: CP
Start: 2021-03-02 — End: ?
  Administered 2021-03-02: 16:00:00 0.25 mg via INTRAVENOUS

## 2021-03-02 NOTE — Progress Notes
Date of Service: 03/02/2021      Subjective:             Reason for Visit:  No chief complaint on file.      Vicki Buchanan is a 65 y.o. 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 02/19/2017 diagnostic laparoscopy with biopsies of the right diaphragm, right liver, right fallopian tube and left pelvis. 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  Plans for exploratory laparotomy with cytoreductive surgery and hyperthermic intraperitoneal chemotherapy in combination with a total abdominal hysterectomy and bilateral salpingo-oophorectomy in Jan. 2019. Developed intestinal obstruction and taken to OR for CRS/HIPEC surgery; however multiple adhesions, omental cracking. CRS/HIPEC was aborted. Immunotherapy on Epacadostat?+ Sirolimus?trial 12/24/2017 - 05/21/2018. She stopped study drug on 05/21/18 for Mitomycin HIPEC trial at [facility] [place].  On 05/28/2018 diagnostic laparoscopy, evacuation of mucinous ascites 1.5 L, peritoneal biopsy, laparoscopic HIPEC with mitomycin-C in [place] by Dr. Daryl Buchanan.  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  Completed second laparoscopic HIPEC on June 25, 2018. Again 1.5L removed.  09/12/18 She underwent debulking at [facility] in [place], [place]. Removed 98% by Dr. Windell Buchanan. Discharged on 09/30/18. She had L. pleural effusion requiring chest tube drainage; intra-abd-abscess in Aug; discharged on 11/04/18 with drain and IV Cefipeme and Diflucan. Her ureteral stents were removed and ostomy relocated in Aug 2020. Also her ostomy was relocated. Hospitalized in Nov-2020 for AKI 2/2 dehydration and likely PE (V/Q scan and Top elevation). CEA increasing again. Repeat CT c/a/p 03/20/19 with stable to slight increase in cancer and RUL nodule. Discussed observation vs treatment. She chose observation and is considering repeat CRS + HIPEC via Dr. Hortencia Buchanan in [place].CT scans from 09/24/2019 of the abdomen, without contrast, appear stable. The peritoneal thickening, carcinomatosis, retroperitoneal lymph nodes, a small amount of fluid collection or trace ascites, appear to be stable compared to 3 months ago. s/p RT to rt lung met on 05/21/20. CT scan on 08/17/2020 shows a right lung mass that is slightly increased from 11 mm to 14 mm. There are some new lung nodules that have developed, a small 1 in the left lung, a new nodule in the right lung. Inside the abdomen we are seeing continued increase in size of the peritoneal nodules. The largest measuring 14 mm compared to 12 mm. The nodule lower in the pelvis increased from 16 mm to 21 mm. 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 TMB = 1.3. s/p CRS w/ ostomy revision and tumor removal around stoma on 10/14/20. Post-op course complicated by high output ileostomy, anemia 2/2 acute blood loss and chronic, stage 3a CKD, and leukocytosis. Vicki Money PA from [facility] cancer center in [place] called to relay their TB recommendations for patient.  Recommendations to proceed with folfiri/avastin with holding avastin 6-8 post op. They also request if our office can repeat genomic testing. Repeat TEMPUS from 10/14/20. Pre-screen for KRAS G12D inhibitor trial, if not eligible will plan for FOLFIRI +/- Bevacizumab        Interval Hx 03/01/21   She is feeling good and tolerating chemo well. She is taking lomotil once a day and this has really helped with her output. She has a sore on her skin from the appliance. It is no longer leaking. She has gained some weigh and is able to eat more solid food, with no signs of obstruction. She denies mouth sores or nausea  or PPE. Her URI symptoms has resolved. Her dysuria is also better after antibiotics, from her PCP.        Review of Systems   Constitutional: Negative for activity change, appetite change, chills, diaphoresis, fatigue, fever and unexpected weight change.   HENT: Negative for facial swelling, mouth sores, sore throat and trouble swallowing.    Eyes: Negative.  Negative for visual disturbance.   Respiratory: Negative.  Negative for cough, chest tightness, shortness of breath and wheezing.    Cardiovascular: Negative.  Negative for chest pain, palpitations and leg swelling.   Gastrointestinal: Positive for diarrhea. Negative for abdominal distention, abdominal pain, anal bleeding, blood in stool, constipation, nausea, rectal pain and vomiting.   Genitourinary: Positive for dysuria. Negative for difficulty urinating and hematuria.   Musculoskeletal: Negative.  Negative for arthralgias, back pain, neck pain and neck stiffness.   Skin: Positive for wound (ulceration near stoma). Negative for color change, pallor and rash.   Neurological: Negative for dizziness, weakness, light-headedness, numbness and headaches.   Hematological: Does not bruise/bleed easily.   Psychiatric/Behavioral: Negative for agitation. The patient is not nervous/anxious.        Medical History:   Diagnosis Date   ? Bundle branch block    ? Cancer [facility])    ? Cancer of appendix [facility])      Surgical History:   Procedure Laterality Date   ? CESAREAN SECTION  1981   ? HX CHOLECYSTECTOMY  1995   ? LAPAROSCOPY  02/2017    biopsy peritoneal   ? COLONOSCOPY DIAGNOSTIC WITH SPECIMEN COLLECTION BY BRUSHING/ WASHING - FLEXIBLE N/A 03/19/2017    Performed by Vicki All, MD at [facility] ENDO   ? COLONOSCOPY WITH BIOPSY - FLEXIBLE  03/19/2017    Performed by Vicki All, MD at [facility] ENDO   ? EXPLORATORY LAPAROTOMY, DIVERTING LOOP ILEOSTOMY N/A 04/03/2017    Performed by Vicki Champagne, MD at [facility] OR   ? INSERTION TUNNELED CENTRAL VENOUS CATHETER - AGE 8 YEARS AND OVER Left 04/23/2017    Performed by Vicki Champagne, MD at [facility] OR   ? CYSTOURETHROSCOPY WITH INDWELLING URETERAL STENT INSERTION Bilateral 04/09/2018    Performed by Vicki Many, MD at [facility] OR   ? CYSTOURETHROSCOPY WITH URETERAL CATHETERIZATION WITH/ WITHOUT IRRIGATION/ INSTILLATION/ URETEROPYELOGRAPHY Bilateral 04/09/2018    Performed by Vicki Many, MD at [facility] OR   ? CYSTOURETHROSCOPY WITH INDWELLING URETERAL STENT EXCHANGE (RIGHT 6 x 26cm / LEFT 6 x 28cm) Bilateral 08/08/2018    Performed by Vicki Many, MD at [facility] OR   ? RETROGRADE UROGRAPHY WITH/ WITHOUT KUB Bilateral 08/08/2018    Performed by Vicki Many, MD at [facility] OR   ? ILEOSTOMY OR JEJUNOSTOMY Right    ? TUNNELED VENOUS PORT PLACEMENT Left    ? URETER STENT PLACEMENT Bilateral      Family History   Problem Relation Age of Onset   ? Diabetes Mother    ? Cancer Father    ? Diabetes Brother    ? Cancer-Lung Paternal Aunt    ? Cancer-Breast Maternal Grandmother    ? Diabetes Paternal Grandmother      Social History     Socioeconomic History   ? Marital status: Married   Tobacco Use   ? Smoking status: Never   ? Smokeless tobacco: Never   Vaping Use   ? Vaping Use: Never used   Substance and Sexual Activity   ? Alcohol use: No     Comment: rarely   ?  Drug use: No         Objective:         ? Cetirizine (ZYRTEC) 10 mg cap Take 10 mg by mouth as Needed.   ? dexAMETHasone (DECADRON) 4 mg tablet 1 tab by mouth with breakfast and afternoon snack on days 2 & 3 following each chemotherapy cycle. Do not take after 6 pm to avoid insomnia.   ? diphenoxylate-atropine (LOMOTIL) 2.5-0.025 mg tablet Take one tablet by mouth four times daily as needed for Diarrhea.   ? famotidine (PEPCID AC) 10 mg tablet Take one tablet by mouth twice daily.   ? loperamide (IMODIUM A-D) 2 mg capsule Take one pill by mouth four times daily as needed   ? LORazepam (ATIVAN) 0.5 mg tablet Take 1-2 tabs by mouth every 6 hrs as needed N/V not controlled by Zofran or Compazine. May also use every 6 hrs as needed anxiety or at bedtime insomnia.   ? magnesium citrate 200 mg tab Take 1 tablet by mouth daily.   ? MULTIVITAMIN PO Take 1 tablet by mouth daily.   ? ondansetron (ZOFRAN ODT) 8 mg rapid dissolve tablet Dissolve one tablet by mouth every 8 hours as needed for Nausea or Vomiting. Place on tongue to disolve.   ? opium tincture oral solution Take 0.6 mL by mouth every 6 hours.   ? prochlorperazine maleate (COMPAZINE) 10 mg tablet 1 tablet by mouth every 6 hours as needed for nausea not controlled by Zofran.   ? psyllium husk (METAMUCIL) 0.4 gram cap    ? sodium chloride 0.9% (NS) 0.9 % 1,000 mL with potassium chloride 2 mEq/mL 20 mEq, magnesium sulfate 4 mEq/mL (50 %) 2 g IV infusion Administer 1L NS with K and 2g Mg once weekly via outpatient infusion.     There were no vitals filed for this visit.  There is no height or weight on file to calculate BMI.               Pain Addressed:  N/A    Patient Evaluated for a Clinical Trial: Patient not eligible for a treatment trial (including not needing treatment, needs palliative care, in remission).     [place] Cooperative Oncology Group performance status is 1, Restricted in physically strenuous activity but ambulatory and able to carry out work of a light or sedentary nature, e.g., light house work, office work.     Physical Exam  Vitals reviewed.   Constitutional:       General: She is not in acute distress.     Appearance: Normal appearance. She is well-developed. She is not ill-appearing, toxic-appearing or diaphoretic.   HENT:      Head: Normocephalic and atraumatic.      Nose: Nose normal. No rhinorrhea.      Mouth/Throat:      Mouth: Mucous membranes are moist. Mucous membranes are not pale. No oral lesions.      Pharynx: Oropharynx is clear. No oropharyngeal exudate or posterior oropharyngeal erythema.      Tonsils: No tonsillar abscesses.   Eyes:      General: No scleral icterus.        Right eye: No discharge.         Left eye: No discharge.      Extraocular Movements: Extraocular movements intact.      Conjunctiva/sclera: Conjunctivae normal.      Pupils: Pupils are equal, round, and reactive to light.   Cardiovascular:      Rate  and Rhythm: Normal rate and regular rhythm.      Pulses: Normal pulses. Heart sounds: Normal heart sounds. No murmur heard.    No gallop.   Pulmonary:      Effort: Pulmonary effort is normal. No respiratory distress.      Breath sounds: Normal breath sounds. No stridor. No wheezing, rhonchi or rales.   Chest:      Chest wall: No tenderness.      Comments: Port site wnl  Abdominal:      General: Bowel sounds are normal. There is no distension.      Palpations: Abdomen is soft. There is no mass.      Tenderness: There is no abdominal tenderness. There is no guarding or rebound.      Hernia: No hernia is present.       Musculoskeletal:         General: No swelling, tenderness, deformity or signs of injury. Normal range of motion.      Cervical back: Normal range of motion and neck supple. No rigidity. No muscular tenderness.      Right lower leg: No edema.      Left lower leg: No edema.   Lymphadenopathy:      Cervical: No cervical adenopathy.   Skin:     General: Skin is warm and dry.      Coloration: Skin is not jaundiced or pale.      Findings: No bruising, erythema, lesion or rash.   Neurological:      General: No focal deficit present.      Mental Status: She is alert and oriented to person, place, and time. Mental status is at baseline.      Cranial Nerves: No cranial nerve deficit.      Motor: No weakness.      Coordination: Coordination normal.      Gait: Gait normal.   Psychiatric:         Mood and Affect: Mood normal.         Behavior: Behavior normal.         Thought Content: Thought content normal.         Judgment: Judgment normal.            CBC w/DIFF  CBC with Diff Latest Ref Rng & Units 02/16/2021 01/26/2021 01/12/2021 12/29/2020 12/15/2020   WBC 4.5 - 11.0 K/UL 11.7(H) 8.6 7.5 7.5 7.9   RBC 4.0 - 5.0 M/UL 3.21(L) 3.16(L) 3.22(L) 3.17(L) 3.22(L)   HGB 12.0 - 15.0 GM/DL 10.9(L) 10.5(L) 10.7(L) 10.4(L) 10.5(L)   HCT 36 - 45 % 32.4(L) 31.3(L) 31.4(L) 31.3(L) 31.3(L)   MCV 80 - 100 FL 101.1(H) 98.9 97.5 98.8 97.2   MCH 26 - 34 PG 33.9 33.1 33.2 32.8 32.6   MCHC 32.0 - 36.0 G/DL 16.1 09.6 04.5 40.9 81.1   RDW 11 - 15 % 16.4(H) 14.5 14.1 14.3 15.1(H)   PLT 150 - 400 K/UL 561(H) 413(H) 479(H) 405(H) 573(H)   MPV 7 - 11 FL 7.4 7.5 7.5 7.8 7.2   NEUT 41 - 77 % 52 55 45 49 56   ANC 1.8 - 7.0 K/UL 6.00 4.60 3.40 3.70 4.40   LYMA 24 - 44 % 21(L) 21(L) 30 30 25    ALYM 1.0 - 4.8 K/UL 2.50 1.80 2.20 2.20 2.00   MONA 4 - 12 % 23(H) 15(H) 19(H) 13(H) 13(H)   AMONO 0 - 0.80 K/UL 2.70(H) 1.30(H) 1.40(H) 1.00(H) 1.00(H)   EOSA 0 - 5 %  3 8(H) 5 6(H) 5   AEOS 0 - 0.45 K/UL 0.40 0.70(H) 0.40 0.40 0.40   BASA 0 - 2 % 1 1 1 2 1    ABAS 0 - 0.20 K/UL 0.10 0.10 0.10 0.10 0.10     Comprehensive Metabolic Profile  CMP Latest Ref Rng & Units 02/16/2021 01/26/2021 01/12/2021 12/29/2020 12/15/2020   NA 137 - 147 MMOL/L 138 139 138 138 137   K 3.5 - 5.1 MMOL/L 4.0 4.0 3.8 4.4 4.2   CL 98 - 110 MMOL/L 96(L) 102 98 101 102   CO2 21 - 30 MMOL/L 32(H) 30 31(H) 28 28   GAP 3 - 12 10 7 9 9 7    BUN 7 - 25 MG/DL 16(X) 09(U) 04(V) 40(J) 24   CR 0.4 - 1.00 MG/DL 8.11(B) 1.47(W) 2.95(A) 2.12(H) 1.98(H)   GLUX 70 - 100 MG/DL 213(Y) 865(H) 846(N) 629(B) 92   CA 8.5 - 10.6 MG/DL 9.9 9.0 8.9 9.2 9.4   TP 6.0 - 8.0 G/DL 2.8(U) 7.1 7.0 7.6 8.0   ALB 3.5 - 5.0 G/DL 3.8 3.6 3.7 3.9 3.7   ALKP 25 - 110 U/L 430(H) 252(H) 425(H) 424(H) 356(H)   ALT 7 - 56 U/L 42 38 66(H) 71(H) 23   TBILI 0.3 - 1.2 MG/DL 0.4 0.4 0.4 0.4 0.4   GFR >60 mL/min - - - - -   GFRAA >60 mL/min - - - - -       Tumor Markers  Lab Results   Component Value Date    CEA 1,616.3 (H) 02/16/2021    CEA 1,825.6 (H) 01/26/2021    CEA 1,504.7 (H) 01/12/2021    CEA 1,815.4 (H) 12/29/2020    CEA 1,749.5 (H) 12/15/2020    CEA 1,403.1 (H) 08/17/2020    CEA 171.8 (H) 03/24/2020    CEA 1,882.9 (H) 09/24/2019    CEA 672.0 (H) 06/17/2019    CEA 665.4 (H) 02/28/2019    CA199 <1 02/16/2021    CA199 <1 01/26/2021    CA199 <1 01/12/2021    CA199 <1 12/29/2020    CA199 <1 12/15/2020    CA199 <1 06/25/2017    CA199 <1 06/11/2017    CA199 <1 05/28/2017    CA199 <1 05/14/2017    CA125 41 (H) 01/21/2018    CA125 42 (H) 11/21/2017    CA125 46 (H) 11/05/2017    CA125 41 (H) 10/22/2017    CA125 34 10/08/2017    CA125 34 09/24/2017    CA125 31 09/10/2017    CA125 29 08/27/2017    CA125 34 08/15/2017    CA125 39 (H) 07/30/2017        Assessment and Plan:    1. Metastatic appendiceal carcinoma. 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   02/19/2017 diagnostic laparoscopy with biopsies of the right diaphragm, right liver, right fallopian tube and left pelvis. 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  Plans for exploratory laparotomy with cytoreductive surgery and hyperthermic intraperitoneal chemotherapy in combination with a total abdominal hysterectomy and bilateral salpingo-oophorectomy in Jan. 2019. Developed intestinal obstruction and taken to OR for CRS/HIPEC surgery; however multiple adhesions, omental cracking. CRS/HIPEC was aborted. Immunotherapy on Epacadostat?+ Sirolimus?trial 12/24/2017 - 05/21/2018. She stopped study drug on 05/21/18 for Mitomycin HIPEC trial at [facility] [place].  On 05/28/2018 diagnostic laparoscopy, evacuation of mucinous ascites 1.5 L, peritoneal biopsy, laparoscopic HIPEC with mitomycin-C in [place] by Dr. Daryl Buchanan.  Pathology revealed a well-differentiated mucinous  adenocarcinoma.  Completed second laparoscopic HIPEC on June 25, 2018. Again 1.5L removed. 09/12/18 She underwent debulking at [facility] in [place], [place]. Removed 98% by Dr. Windell Buchanan. Discharged on 09/30/18. She had L. pleural effusion requiring chest tube drainage; intra-abd-abscess in Aug; discharged on 11/04/18 with drain and IV Cefipeme and Diflucan. Her ureteral stents were removed and ostomy relocated in Aug 2020. Also her ostomy was relocated. Hospitalized in Nov-2020 for AKI 2/2 dehydration and likely PE (V/Q scan and Top elevation). CEA increasing again. Repeat CT c/a/p 03/20/19 with stable to slight increase in cancer and RUL nodule. Discussed observation vs treatment. She chose observation and is considering repeat CRS + HIPEC via Dr. Hortencia Buchanan in [place].CT scans from 09/24/2019 of the abdomen, without contrast, appear stable. The peritoneal thickening, carcinomatosis, retroperitoneal lymph nodes, a small amount of fluid collection or trace ascites, appear to be stable compared to 3 months ago. s/p RT to rt lung met on 05/21/20. CT scan on 08/17/2020 shows a right lung mass that is slightly increased from 11 mm to 14 mm. There are some new lung nodules that have developed, a small 1 in the left lung, a new nodule in the right lung. Inside the abdomen we are seeing continued increase in size of the peritoneal nodules. The largest measuring 14 mm compared to 12 mm. The nodule lower in the pelvis increased from 16 mm to 21 mm. 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 TMB = 1.3. s/p CRS w/ ostomy revision and tumor removal around stoma on 10/14/20. Post-op course complicated by high output ileostomy, anemia 2/2 acute blood loss and chronic, stage 3a CKD, and leukocytosis. Vicki Money PA from [facility] cancer center in [place] called to relay their TB recommendations for patient.  Recommendations to proceed with folfiri/avastin with holding avastin 6-8 post op. They also request if our office can repeat genomic testing. Repeat TEMPUS from 10/14/20. Pre-screen for KRAS G12D inhibitor trial, if not eligible will plan for FOLFIRI +/- Tolerated FOLFIRI cycle 1 but more diarrhea and N/V after cycle 2. Decrease irinotecan to 130 mg/m2 due to diarrhea. s/p cycle 4, with the bevacizumab added on. CT scan 02/16/21 abdominal wall spots are under control and stable. There is a slight increase in size of the lung nodules. The radiologist is measuring 10 mm to 8 mm in 11/2020. The other right lung nodule is more or less the same 15 mm.    Plan 03/01/21   Tolerating treatment well.  URI symptoms improved, as well as UTI. Proceed with C6 FOLFIRI and bev today 12/14  RTC in 2 weeks with labs and visit for C7  Continue supportive measures.     2. Elevated creatinine, S/P ureteral stent. Her ureteral stents were removed in Aug 2020. Cr. 1.79. Encouraged hydration. S/p IVF 1L NS and 1g Mag weekly x 4 weeks. Refer to Nephrology as Cr is improving with IVF.  She is following with Dr. Marjie Skiff in nephrology. 2.48 creatinine after GI virus. Decrease irinotecan to 130 mg/m2.  Continue IV fluids 3 times a week locally, as we anticipate more diarrhea with irinotecan. Now 1.9, diarrhea better with lomotil. Continue IVF closer to home, lomotil and chemo.     3. Diarrhea. Better with lomotil daily. Continue IV fluids 3 times a week locally.    4. Nausea. From chemo. Then worse last week with possible GI virus. Try compazine. Refill zofran ODT. Resolved now that infections have cleared. Chemo as above, continue antiemetics,  call if not controlled.     5. Fatigue. Improved now that infections better.    6. Urinary tract infection. Treated by Abx via PCP. She wants to follow up next week with PCP next week to see if the infection is cleared. If not, would recommend urology or uro-gyn consult.     Turner Daniels, APRN-NP          The patient and family were allowed to ask questions and voice concerns; these were addressed to the best of our ability. They expressed understanding of what was explained to them, and they agreed with the present plan. RTC in 2 weeks with labs to see a provider. Patient has the phone numbers for the Cancer Center and was instructed on how to contact us with any questions or concerns. My collaborating physician on this case is Dr. Josiah Lobo.

## 2021-03-08 ENCOUNTER — Encounter: Admit: 2021-03-08 | Discharge: 2021-03-08 | Payer: MEDICARE

## 2021-03-16 ENCOUNTER — Encounter: Admit: 2021-03-16 | Discharge: 2021-03-16 | Payer: MEDICARE

## 2021-03-16 DIAGNOSIS — C786 Secondary malignant neoplasm of retroperitoneum and peritoneum: Secondary | ICD-10-CM

## 2021-03-16 DIAGNOSIS — C801 Malignant (primary) neoplasm, unspecified: Secondary | ICD-10-CM

## 2021-03-16 DIAGNOSIS — C181 Malignant neoplasm of appendix: Secondary | ICD-10-CM

## 2021-03-16 DIAGNOSIS — Z5111 Encounter for antineoplastic chemotherapy: Secondary | ICD-10-CM

## 2021-03-16 DIAGNOSIS — I454 Nonspecific intraventricular block: Secondary | ICD-10-CM

## 2021-03-16 LAB — RVP VIRAL PANEL PCR

## 2021-03-16 MED ORDER — SODIUM CHLORIDE 0.9 % IV SOLP
2000 mL | Freq: Once | INTRAVENOUS | 0 refills | Status: CP
Start: 2021-03-16 — End: ?
  Administered 2021-03-16: 15:00:00 2000 mL via INTRAVENOUS

## 2021-03-16 MED ORDER — HEPARIN, PORCINE (PF) 100 UNIT/ML IV SYRG
500 [IU] | Freq: Once | 0 refills | Status: CP
Start: 2021-03-16 — End: ?

## 2021-03-16 MED ORDER — SODIUM CHLORIDE 0.9 % IV SOLP
2000 mL | Freq: Once | INTRAVENOUS | 0 refills
Start: 2021-03-16 — End: ?

## 2021-03-16 MED ORDER — SODIUM CHLORIDE 0.9 % IV SOLP
2000 mL | Freq: Once | INTRAVENOUS | 0 refills | Status: CN
Start: 2021-03-16 — End: ?

## 2021-03-16 NOTE — Progress Notes
Patient arrived to [facility] treatment for IVF after being seen in clinic by Dr. Eldred Manges. RVP swap performed. 1 liter IVF  given w/o complications, patient tolerated well. Port positive for blood return, flushed with heparin, and deaccessed per protocol. Patient declined copy of labs and AVS. All questions and concerns addressed. Patient left [facility] treatment w/ all personal belongings in stable condition.

## 2021-03-16 NOTE — Patient Instructions
Dr. Anup Kasi MD Medical Oncologist specializing in Gastro-Intestinal malignancies   Erin Carroll APRN    Nurse: Maddie Lark Runk RN BSN. Phone: [phone] Fax: [phone], available Monday-Friday 8:00am- 4:00pm. After your new patient visit, we will be listening into the clinic appointments via Teams on the iPad. This is the best way to make sure we hear your concerns, the provider's recommendations and do the behind the scenes work to give you the best care.      On call number for urgent needs outside of business hours [phone]- ask for the On call Oncology fellow to be paged and they will call you back.    Medication refills: please contact your pharmacy for medication refills, if they do not have any refills on file they will contact the office, make sure they have our correct contact information on file   P: [phone], F: [phone]    MyChart Messages: All mychart messages are answered by the nurses, even if you select to send the message to Dr. Kasi or Erin.  We often communicate with Dr. Kasi and Erin on how to answer these messages, but all messages will come from the nurses.  Messages are answered 8:00am-4:00pm Monday-Friday, holidays excluded.    Phone Calls: We are hardly ever sitting at our desks where our phone is located as we are in clinic most days during the week. Please leave us a message as we check our messages several times per day.  It is our goal to answer these messages as soon as possible.  Messages are answered from 8:00am-4:00 pm Monday-Friday, holidays excluded.  Please make sure you leave your full name with the spelling, date of birth, reason for your call, and return number when leaving a message.

## 2021-03-18 ENCOUNTER — Encounter: Admit: 2021-03-18 | Discharge: 2021-03-18 | Payer: MEDICARE

## 2021-03-18 DIAGNOSIS — I454 Nonspecific intraventricular block: Secondary | ICD-10-CM

## 2021-03-18 DIAGNOSIS — C181 Malignant neoplasm of appendix: Secondary | ICD-10-CM

## 2021-03-18 DIAGNOSIS — C801 Malignant (primary) neoplasm, unspecified: Secondary | ICD-10-CM

## 2021-03-27 ENCOUNTER — Encounter: Admit: 2021-03-27 | Discharge: 2021-03-27 | Payer: MEDICARE

## 2021-03-27 MED ORDER — FLUOROURACIL IV AMB PUMP
2400 mg/m2 | Freq: Once | INTRAVENOUS | 0 refills
Start: 2021-03-27 — End: ?

## 2021-03-27 MED ORDER — PALONOSETRON 0.25 MG/5 ML IV SOLN
.25 mg | Freq: Once | INTRAVENOUS | 0 refills
Start: 2021-03-27 — End: ?

## 2021-03-27 MED ORDER — ATROPINE 0.4 MG/ML IJ SOLN
0.4 mg | INTRAVENOUS | 0 refills | PRN
Start: 2021-03-27 — End: ?

## 2021-03-27 MED ORDER — DEXAMETHASONE 6 MG PO TAB
12 mg | Freq: Once | ORAL | 0 refills
Start: 2021-03-27 — End: ?

## 2021-03-30 ENCOUNTER — Encounter: Admit: 2021-03-30 | Discharge: 2021-03-30 | Payer: MEDICARE

## 2021-03-30 DIAGNOSIS — C786 Secondary malignant neoplasm of retroperitoneum and peritoneum: Secondary | ICD-10-CM

## 2021-03-30 DIAGNOSIS — C181 Malignant neoplasm of appendix: Secondary | ICD-10-CM

## 2021-03-30 DIAGNOSIS — C801 Malignant (primary) neoplasm, unspecified: Secondary | ICD-10-CM

## 2021-03-30 DIAGNOSIS — I454 Nonspecific intraventricular block: Secondary | ICD-10-CM

## 2021-03-30 MED ORDER — ATROPINE 0.4 MG/ML IJ SOLN
0.4 mg | INTRAVENOUS | 0 refills | Status: AC | PRN
Start: 2021-03-30 — End: ?
  Administered 2021-03-30: 20:00:00 0.4 mg via INTRAVENOUS

## 2021-03-30 MED ORDER — POTASSIUM CHLORIDE 10 MEQ PO CPER
10 meq | ORAL_CAPSULE | Freq: Two times a day (BID) | ORAL | 1 refills | 30.00000 days | Status: AC
Start: 2021-03-30 — End: ?

## 2021-03-30 MED ORDER — DEXAMETHASONE 6 MG PO TAB
12 mg | Freq: Once | ORAL | 0 refills | Status: CP
Start: 2021-03-30 — End: ?
  Administered 2021-03-30: 19:00:00 12 mg via ORAL

## 2021-03-30 MED ORDER — PALONOSETRON 0.25 MG/5 ML IV SOLN
.25 mg | Freq: Once | INTRAVENOUS | 0 refills | Status: CP
Start: 2021-03-30 — End: ?
  Administered 2021-03-30: 19:00:00 0.25 mg via INTRAVENOUS

## 2021-03-30 MED ORDER — IRINOTECAN IVPB
130 mg/m2 | Freq: Once | INTRAVENOUS | 0 refills | Status: CP
Start: 2021-03-30 — End: ?
  Administered 2021-03-30 (×2): 196.4 mg via INTRAVENOUS

## 2021-03-30 MED ORDER — SODIUM CHLORIDE 0.9 % IV SOLP
1000 mL | Freq: Once | INTRAVENOUS | 0 refills | Status: CP
Start: 2021-03-30 — End: ?
  Administered 2021-03-30: 19:00:00 1000 mL via INTRAVENOUS

## 2021-03-30 MED ORDER — BEVACIZUMAB-AWWB IVPB
5 mg/kg | Freq: Once | INTRAVENOUS | 0 refills | Status: CP
Start: 2021-03-30 — End: ?
  Administered 2021-03-30 (×2): 260.5 mg via INTRAVENOUS

## 2021-03-30 MED ORDER — FLUOROURACIL IV AMB PUMP
2400 mg/m2 | Freq: Once | INTRAVENOUS | 0 refills | Status: CP
Start: 2021-03-30 — End: ?
  Administered 2021-03-30 (×2): 3624 mg via INTRAVENOUS

## 2021-03-30 NOTE — Progress Notes
CHEMO NOTE  Verified chemo consent signed and in chart.    Blood return positive via: Port (Single and Accessed)    BSA and dose double checked (agree with orders as written) with: yes, see MAR    Labs/applicable tests checked: CBC, CMP, BP    Chemo regimen: Drug/cycle/day C8D1  bevacizumab-awwb (MVASI) 260.5 mg in sodium chloride 0.9% (NS) 110.42 mL IVPB   irinotecan (CAMPTOSAR) 196.4 mg in dextrose 5% (D5W) 509.82 mL IVPB  fluorouraciL (ADRUCIL) 3,624 mg in sodium chloride 0.9% (NS) 92 mL IV amb pump    Rate verified and armband double check with second RN: yes    Patient education offered and stated understanding. Denies questions at this time.    Patient presents for C8D1 after being evaluated in clinic. Port positive for blood return and orders reviewed. Patient tolerated treatment. Port positive for blood return and connected to 5FU ambulatory pump; all connections secure and clamps open. Patient states she gets unhooked locally. Pump infusing at time of dc  Patient dismissed in stable condition.

## 2021-04-25 ENCOUNTER — Encounter: Admit: 2021-04-25 | Discharge: 2021-04-25 | Payer: MEDICARE

## 2021-04-25 MED ORDER — PALONOSETRON 0.25 MG/5 ML IV SOLN
.25 mg | Freq: Once | INTRAVENOUS | 0 refills
Start: 2021-04-25 — End: ?

## 2021-04-25 MED ORDER — DEXAMETHASONE 6 MG PO TAB
12 mg | Freq: Once | ORAL | 0 refills
Start: 2021-04-25 — End: ?

## 2021-04-25 MED ORDER — ATROPINE 0.4 MG/ML IJ SOLN
0.4 mg | INTRAVENOUS | 0 refills | PRN
Start: 2021-04-25 — End: ?

## 2021-04-25 MED ORDER — FLUOROURACIL IV AMB PUMP
2400 mg/m2 | Freq: Once | INTRAVENOUS | 0 refills
Start: 2021-04-25 — End: ?

## 2021-04-27 ENCOUNTER — Encounter: Admit: 2021-04-27 | Discharge: 2021-04-27 | Payer: MEDICARE

## 2021-04-27 DIAGNOSIS — I454 Nonspecific intraventricular block: Secondary | ICD-10-CM

## 2021-04-27 DIAGNOSIS — C181 Malignant neoplasm of appendix: Secondary | ICD-10-CM

## 2021-04-27 DIAGNOSIS — C786 Secondary malignant neoplasm of retroperitoneum and peritoneum: Secondary | ICD-10-CM

## 2021-04-27 DIAGNOSIS — C801 Malignant (primary) neoplasm, unspecified: Secondary | ICD-10-CM

## 2021-04-27 LAB — POC CREATININE, RAD: CREATININE, POC: 1.9 mg/dL — ABNORMAL HIGH (ref 0.4–1.00)

## 2021-04-27 MED ORDER — MAGNESIUM SULFATE IN D5W 1 GRAM/100 ML IV PGBK
1 g | Freq: Once | INTRAVENOUS | 0 refills | Status: CP
Start: 2021-04-27 — End: ?
  Administered 2021-04-27: 19:00:00 1 g via INTRAVENOUS

## 2021-04-27 MED ORDER — SODIUM CHLORIDE 0.9 % IV SOLP
1000 mL | Freq: Once | INTRAVENOUS | 0 refills | Status: CP
Start: 2021-04-27 — End: ?
  Administered 2021-04-27: 17:00:00 1000 mL via INTRAVENOUS

## 2021-04-27 MED ORDER — DEXAMETHASONE 6 MG PO TAB
12 mg | Freq: Once | ORAL | 0 refills | Status: CP
Start: 2021-04-27 — End: ?
  Administered 2021-04-27: 17:00:00 12 mg via ORAL

## 2021-04-27 MED ORDER — IRINOTECAN IVPB
130 mg/m2 | Freq: Once | INTRAVENOUS | 0 refills | Status: CP
Start: 2021-04-27 — End: ?
  Administered 2021-04-27 (×2): 196.4 mg via INTRAVENOUS

## 2021-04-27 MED ORDER — ATROPINE 0.4 MG/ML IJ SOLN
0.4 mg | INTRAVENOUS | 0 refills | Status: AC | PRN
Start: 2021-04-27 — End: ?
  Administered 2021-04-27: 19:00:00 0.4 mg via INTRAVENOUS

## 2021-04-27 MED ORDER — HEPARIN, PORCINE (PF) 100 UNIT/ML IV SYRG
500 [IU] | Freq: Once | 0 refills | Status: AC
Start: 2021-04-27 — End: ?

## 2021-04-27 MED ORDER — FLUOROURACIL IV AMB PUMP
2400 mg/m2 | Freq: Once | INTRAVENOUS | 0 refills | Status: CP
Start: 2021-04-27 — End: ?
  Administered 2021-04-27 (×2): 3624 mg via INTRAVENOUS

## 2021-04-27 MED ORDER — MAGNESIUM SULFATE IN WATER 2 GRAM/50 ML (4 %) IV PGBK
1 g | Freq: Once | INTRAVENOUS | 0 refills | Status: DC
Start: 2021-04-27 — End: 2021-04-27

## 2021-04-27 MED ORDER — PALONOSETRON 0.25 MG/5 ML IV SOLN
.25 mg | Freq: Once | INTRAVENOUS | 0 refills | Status: CP
Start: 2021-04-27 — End: ?
  Administered 2021-04-27: 18:00:00 0.25 mg via INTRAVENOUS

## 2021-04-27 MED ORDER — MAGNESIUM SULFATE IN D5W 1 GRAM/100 ML IV PGBK
1 g | Freq: Once | INTRAVENOUS | 0 refills | Status: CN
Start: 2021-04-27 — End: ?

## 2021-04-27 MED ORDER — BEVACIZUMAB-AWWB IVPB
5 mg/kg | Freq: Once | INTRAVENOUS | 0 refills | Status: CP
Start: 2021-04-27 — End: ?
  Administered 2021-04-27 (×2): 260.5 mg via INTRAVENOUS

## 2021-04-27 NOTE — Patient Instructions
Dr. Anup Kasi MD Medical Oncologist specializing in Gastro-Intestinal malignancies   Erin Carroll APRN    Nurse: Maddie Jarman Litton RN BSN. Phone: [phone] Fax: [phone], available Monday-Friday 8:00am- 4:00pm. After your new patient visit, we will be listening into the clinic appointments via Teams on the iPad. This is the best way to make sure we hear your concerns, the provider's recommendations and do the behind the scenes work to give you the best care.      On call number for urgent needs outside of business hours [phone]- ask for the On call Oncology fellow to be paged and they will call you back.    Medication refills: please contact your pharmacy for medication refills, if they do not have any refills on file they will contact the office, make sure they have our correct contact information on file P: [phone], F: [phone]. Please allow 24 to 48 hours for new medication refill requests.     MyChart Messages: All mychart messages are answered by the nurses, even if you select to send the message to Dr. Kasi or Erin.  We often communicate with Dr. Kasi and Erin on how to answer these messages, but all messages will come from the nurses.  Messages are answered 8:00am-4:00pm Monday-Friday, holidays excluded.    Phone Calls: We are hardly ever sitting at our desks where our phone is located as we are in clinic most days during the week. Please leave us a message as we check our messages several times per day.  It is our goal to answer these messages as soon as possible.  Messages are answered from 8:00am-4:00 pm Monday-Friday, holidays excluded.  Please make sure you leave your full name with the spelling, date of birth, reason for your call, and return number when leaving a message.

## 2021-04-27 NOTE — Progress Notes
CHEMO NOTE  Verified chemo consent signed and in chart.    Verified initiate chemo order in O2    Blood return positive via: Port (Single and Accessed)    BSA and dose double checked (agree with orders as written) with: See MAR    Labs/applicable tests checked: CBC and Comprehensive Metabolic Panel (CMP)    Chemo regime: Folfiri+Mvasi- Cycle8/Day1    Rate verified and armband double checkwith second RN: See [facility]    Patient education offered and stated understanding. Denies questions at this time.    Pt presents from exam where she was assessed by the provider to [facility] tx without complaints. PAC already accessed- patent and with good blood return.  Labs ok for tx and prehydration and premeds started.  Premeds and prehydration complete. Mvasi given over 80mins. Irinotecan started TRO 29mins and ordered Mag Sulfate started TRO 1hr.  1420- Mag complete. Irinotecan continues and pt without complaints. Report given to Marena Chancy RN.

## 2021-04-27 NOTE — Progress Notes
Received pass off from Delray Alt, RN @ 1400. Patient tolerated remainder of irinotecan without complications. PAC flushed, positive for blood return and secured connection to 5FU - all clamps open and infusing. Patient ambulated from cc in stable condition.

## 2021-04-27 NOTE — Patient Instructions
[facility]  Discharge Instructions  Cancer Center Phone # [phone] (Answered 24 hrs a day)      Discharge Instructions  Call immediately to report the following:  Uncontrolled nausea or vomiting, pain, or bleeding  Temperature of 100.5 F or greater or any sign/symptom of infection (warmth, redness, tenderness)  Painful mouth or difficulty swallowing  Diarrhea   Swelling of arms or legs  Rash     General Post-Treatment Directions:    Drink 8-10 glasses of fluids daily.  Try to exercise daily to decrease fatigue.  To prevent/soothe mouth sores use mouth rinses after meals and at bedtime.  Use a non-alcohol commercial brand rinse or mild salt water/baking soda rinse.

## 2021-04-28 ENCOUNTER — Encounter: Admit: 2021-04-28 | Discharge: 2021-04-28 | Payer: MEDICARE

## 2021-05-11 ENCOUNTER — Encounter: Admit: 2021-05-11 | Discharge: 2021-05-11 | Payer: MEDICARE

## 2021-05-11 DIAGNOSIS — C181 Malignant neoplasm of appendix: Secondary | ICD-10-CM

## 2021-05-11 DIAGNOSIS — C786 Secondary malignant neoplasm of retroperitoneum and peritoneum: Secondary | ICD-10-CM

## 2021-05-11 DIAGNOSIS — C801 Malignant (primary) neoplasm, unspecified: Secondary | ICD-10-CM

## 2021-05-11 DIAGNOSIS — I454 Nonspecific intraventricular block: Secondary | ICD-10-CM

## 2021-05-11 MED ORDER — IRINOTECAN IVPB
130 mg/m2 | Freq: Once | INTRAVENOUS | 0 refills | Status: CP
Start: 2021-05-11 — End: ?
  Administered 2021-05-11 (×2): 196.4 mg via INTRAVENOUS

## 2021-05-11 MED ORDER — SODIUM CHLORIDE 0.9 % IV SOLP
1000 mL | Freq: Once | INTRAVENOUS | 0 refills | Status: CP
Start: 2021-05-11 — End: ?
  Administered 2021-05-11: 16:00:00 1000 mL via INTRAVENOUS

## 2021-05-11 MED ORDER — FLUOROURACIL IV AMB PUMP
2400 mg/m2 | Freq: Once | INTRAVENOUS | 0 refills | Status: CP
Start: 2021-05-11 — End: ?
  Administered 2021-05-11 (×4): 3624 mg via INTRAVENOUS

## 2021-05-11 MED ORDER — DEXAMETHASONE 6 MG PO TAB
12 mg | Freq: Once | ORAL | 0 refills | Status: CP
Start: 2021-05-11 — End: ?
  Administered 2021-05-11: 16:00:00 12 mg via ORAL

## 2021-05-11 MED ORDER — ATROPINE 0.4 MG/ML IJ SOLN
.4 mg | INTRAVENOUS | 0 refills | Status: AC | PRN
Start: 2021-05-11 — End: ?
  Administered 2021-05-11: 17:00:00 0.4 mg via INTRAVENOUS

## 2021-05-11 MED ORDER — PALONOSETRON 0.25 MG/5 ML IV SOLN
.25 mg | Freq: Once | INTRAVENOUS | 0 refills | Status: CP
Start: 2021-05-11 — End: ?
  Administered 2021-05-11: 16:00:00 0.25 mg via INTRAVENOUS

## 2021-05-11 MED ORDER — HEPARIN, PORCINE (PF) 100 UNIT/ML IV SYRG
500 [IU] | Freq: Once | 0 refills | Status: AC
Start: 2021-05-11 — End: ?

## 2021-05-11 MED ORDER — BEVACIZUMAB-AWWB IVPB
5 mg/kg | Freq: Once | INTRAVENOUS | 0 refills | Status: CP
Start: 2021-05-11 — End: ?
  Administered 2021-05-11 (×2): 260.5 mg via INTRAVENOUS

## 2021-05-11 NOTE — Progress Notes
Date of Service: 05/11/2021    SUBJECTIVE:             Reason for Visit:  No chief complaint on file.    Vicki Buchanan is a 66 y.o. 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  02/19/17 diagnostic laparoscopy with biopsies of the right diaphragm, right liver, right fallopian tube and left pelvis. 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  Planned for exploratory laparotomy with CRS/HIPEC in combination with a TAH/BSO in 03/2017.  Developed intestinal obstruction and taken to OR for CRS/HIPEC surgery; however multiple adhesions, omental cracking. CRS/HIPEC was aborted.  Immunotherapy on epacadostat?+ sirolimus?trial 12/24/17-05/21/18.  She stopped study drug on 05/21/18 for mitomycin HIPEC trial at [facility] [place].  On 05/28/18 diagnostic laparoscopy, evacuation of mucinous ascites 1.5L, peritoneal biopsy, laparoscopic HIPEC with mitomycin-C in [place] by Dr. Daryl Eastern.  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  Completed second laparoscopic HIPEC on 06/25/18.  Again 1.5L removed.  09/12/18 underwent debulking at [facility] in [place], [place].  Removed 98% by Dr. [name].  Discharged on 09/30/18.  She had L pleural effusion requiring chest tube drainage; intra-abd abscess 10/2018; discharged on 11/04/18 with drain and IV cefepime and Diflucan. Her ureteral stents were removed and ostomy relocated in 10/2018.  Hospitalized in 01/2019 for AKI 2/2 dehydration and likely PE (V/Q scan and trop elevation). CEA increasing again. Repeat CT CAP 03/20/19 with stable to slight increase in cancer and RUL nodule.  Discussed observation vs treatment. She chose observation and is considering repeat CRS + HIPEC via Dr. [name] in [place].  CT scans from 09/24/19 of the abdomen, without contrast, appear stable.  The peritoneal thickening, carcinomatosis, retroperitoneal lymph nodes, a small amount of fluid collection or trace ascites, appear to be stable compared to 3 months ago. 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  CT scan on 08/17/20 showed a right lung mass that is slightly increased from 11 mm to 14 mm. There are some new lung nodules that have developed, a small one in the left lung, a new nodule in the right lung. Inside the abdomen we are seeing continued increase in size of the peritoneal nodules. The largest measuring 14 mm compared to 12 mm.  The nodule lower in the pelvis increased from 16 mm to 21 mm.  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  TMB = 1.3.  S/p CRS w/ ostomy revision and tumor removal around stoma on 10/14/20.  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 [name] PA from [facility] in [place] called to relay their TB recommendations for patient.  Recommendations to proceed with FOLFIRI/Avastin with holding Avastin 6-8 weeks post-op. They also request if our office can repeat genomic testing.  Repeat TEMPUS from 10/14/20.  Pre-screen for KRAS G12D inhibitor trial, if not eligible will plan for FOLFIRI +/- bevacizumab.       Interval Hx 05/11/21:  Pt is feeling good with no new complaints. She tolerated treatment a little better this last cycle. The only pain she has is a small lesion under her ostomy. It feels like a blister. She denies N/V. Her diarrhea is better. She continues to get IVF 3 times a week and magnesium on Wednesday if needed. Her energy has improved and she has been more active. Her endurance is also  better. She denies fevers or chills.        Review of Systems   Constitutional: Positive for fatigue. Negative for activity change, appetite change, chills, diaphoresis, fever and unexpected weight change.   HENT: Negative for facial swelling, mouth sores, sore throat, trouble swallowing and voice change.    Eyes: Negative.  Negative for visual disturbance. Respiratory: Negative for cough, chest tightness, shortness of breath and wheezing.    Cardiovascular: Negative.  Negative for chest pain, palpitations and leg swelling.   Gastrointestinal: Positive for diarrhea. Negative for abdominal distention, abdominal pain, anal bleeding, blood in stool, constipation, nausea, rectal pain and vomiting.   Genitourinary: Negative for difficulty urinating, dysuria and hematuria.   Musculoskeletal: Negative.  Negative for arthralgias, back pain, neck pain and neck stiffness.   Skin: Negative for color change, pallor, rash and wound (ulceration near stoma healed).   Neurological: Negative for dizziness, weakness, light-headedness, numbness and headaches.   Hematological: Does not bruise/bleed easily.   Psychiatric/Behavioral: Negative for agitation. The patient is not nervous/anxious.        Medical History:   Diagnosis Date   ? Bundle branch block    ? Cancer [facility])    ? Cancer of appendix [facility])      Surgical History:   Procedure Laterality Date   ? CESAREAN SECTION  1981   ? HX CHOLECYSTECTOMY  1995   ? LAPAROSCOPY  02/2017    biopsy peritoneal   ? COLONOSCOPY DIAGNOSTIC WITH SPECIMEN COLLECTION BY BRUSHING/ WASHING - FLEXIBLE N/A 03/19/2017    Performed by Everardo All, MD at [facility] ENDO   ? COLONOSCOPY WITH BIOPSY - FLEXIBLE  03/19/2017    Performed by Everardo All, MD at [facility] ENDO   ? EXPLORATORY LAPAROTOMY, DIVERTING LOOP ILEOSTOMY N/A 04/03/2017    Performed by Loralie Champagne, MD at [facility] OR   ? INSERTION TUNNELED CENTRAL VENOUS CATHETER - AGE 15 YEARS AND OVER Left 04/23/2017    Performed by Loralie Champagne, MD at [facility] OR   ? CYSTOURETHROSCOPY WITH INDWELLING URETERAL STENT INSERTION Bilateral 04/09/2018    Performed by Earl Many, MD at [facility] OR   ? CYSTOURETHROSCOPY WITH URETERAL CATHETERIZATION WITH/ WITHOUT IRRIGATION/ INSTILLATION/ URETEROPYELOGRAPHY Bilateral 04/09/2018    Performed by Earl Many, MD at [facility] OR   ? CYSTOURETHROSCOPY WITH INDWELLING URETERAL STENT EXCHANGE (RIGHT 6 x 26cm / LEFT 6 x 28cm) Bilateral 08/08/2018    Performed by Earl Many, MD at [facility] OR   ? RETROGRADE UROGRAPHY WITH/ WITHOUT KUB Bilateral 08/08/2018    Performed by Earl Many, MD at [facility] OR   ? ILEOSTOMY OR JEJUNOSTOMY Right    ? TUNNELED VENOUS PORT PLACEMENT Left    ? URETER STENT PLACEMENT Bilateral      Family History   Problem Relation Age of Onset   ? Diabetes Mother    ? Cancer Father    ? Diabetes Brother    ? Cancer-Lung Paternal Aunt    ? Cancer-Breast Maternal Grandmother    ? Diabetes Paternal Grandmother      Social History     Socioeconomic History   ? Marital status: Married   Tobacco Use   ? Smoking status: Never   ? Smokeless tobacco: Never   Vaping Use   ? Vaping Use: Never used   Substance and Sexual Activity   ? Alcohol use: No     Comment: rarely   ? Drug use: No         OBJECTIVE:         ?  cefdinir (OMNICEF) 300 mg capsule Take one capsule by mouth. FOR 10 DAYS   ? Cetirizine (ZYRTEC) 10 mg cap Take one capsule by mouth as Needed.   ? dexAMETHasone (DECADRON) 4 mg tablet 1 tab by mouth with breakfast and afternoon snack on days 2 & 3 following each chemotherapy cycle. Do not take after 6 pm to avoid insomnia.   ? diphenoxylate-atropine (LOMOTIL) 2.5-0.025 mg tablet Take one tablet by mouth four times daily as needed for Diarrhea.   ? famotidine (PEPCID AC) 10 mg tablet Take one tablet by mouth twice daily.   ? loperamide (IMODIUM A-D) 2 mg capsule Take one pill by mouth four times daily as needed   ? LORazepam (ATIVAN) 0.5 mg tablet Take 1-2 tabs by mouth every 6 hrs as needed N/V not controlled by Zofran or Compazine. May also use every 6 hrs as needed anxiety or at bedtime insomnia.   ? magnesium citrate 200 mg tab Take one tablet by mouth daily.   ? MULTIVITAMIN PO Take 1 tablet by mouth daily.   ? ondansetron (ZOFRAN ODT) 8 mg rapid dissolve tablet Dissolve one tablet by mouth every 8 hours as needed for Nausea or Vomiting. Place on tongue to disolve.   ? opium tincture oral solution Take 0.6 mL by mouth every 6 hours.   ? potassium chloride (KLOR-CON SPRINKLE) 10 mEq capsule Take one capsule by mouth twice daily. If potassium < 3.5. Take with a meal and a full glass of water.   ? prochlorperazine maleate (COMPAZINE) 10 mg tablet 1 tablet by mouth every 6 hours as needed for nausea not controlled by Zofran.   ? psyllium husk (METAMUCIL) 0.4 gram cap    ? sodium chloride 0.9% (NS) 0.9 % 1,000 mL with potassium chloride 2 mEq/mL 20 mEq, magnesium sulfate 4 mEq/mL (50 %) 2 g IV infusion Administer 1L NS with K and 2g Mg once weekly via outpatient infusion.     Vitals:    05/11/21 0826   BP: 104/74   BP Source: Arm, Right Upper   Pulse: 84   Temp: 36.4 ?C (97.6 ?F)   Resp: 16   SpO2: 100%   TempSrc: Temporal   PainSc: Zero   Weight: 51.3 kg (113 lb)   Height: 156 cm (5' 1.42)     Body mass index is 21.06 kg/m?Marland Kitchen     Pain Score: Zero       Pain Addressed:  N/A    Patient Evaluated for a Clinical Trial: Patient not eligible for a treatment trial (including not needing treatment, needs palliative care, in remission).     [place] Cooperative Oncology Group performance status is 1, Restricted in physically strenuous activity but ambulatory and able to carry out work of a light or sedentary nature, e.g., light house work, office work.       Physical Exam  Vitals reviewed.   Constitutional:       General: She is not in acute distress.     Appearance: Normal appearance. She is well-developed. She is not ill-appearing, toxic-appearing or diaphoretic.   HENT:      Head: Normocephalic and atraumatic.      Nose: Nose normal. No rhinorrhea.      Mouth/Throat:      Mouth: Mucous membranes are moist. Mucous membranes are not pale. No oral lesions.      Pharynx: Oropharynx is clear. No oropharyngeal exudate or posterior oropharyngeal erythema.      Tonsils: No tonsillar abscesses.  Eyes:      General: No scleral icterus.        Right eye: No discharge.         Left eye: No discharge.      Extraocular Movements: Extraocular movements intact.      Conjunctiva/sclera: Conjunctivae normal.      Pupils: Pupils are equal, round, and reactive to light.   Cardiovascular:      Rate and Rhythm: Normal rate and regular rhythm.      Pulses: Normal pulses.      Heart sounds: Normal heart sounds. No murmur heard.    No gallop.   Pulmonary:      Effort: Pulmonary effort is normal. No respiratory distress.      Breath sounds: Normal breath sounds. No stridor. No wheezing, rhonchi or rales.   Chest:      Chest wall: No tenderness.      Comments: Port site WNL  Abdominal:      General: Bowel sounds are normal. There is no distension.      Palpations: Abdomen is soft. There is no mass.      Tenderness: There is no abdominal tenderness. There is no guarding or rebound.      Hernia: No hernia is present.       Musculoskeletal:         General: No swelling, tenderness, deformity or signs of injury. Normal range of motion.      Cervical back: Normal range of motion and neck supple. No rigidity. No muscular tenderness.      Right lower leg: No edema.      Left lower leg: No edema.   Lymphadenopathy:      Cervical: No cervical adenopathy.   Skin:     General: Skin is warm and dry.      Coloration: Skin is not jaundiced or pale.      Findings: No bruising, erythema, lesion or rash.   Neurological:      General: No focal deficit present.      Mental Status: She is alert and oriented to person, place, and time. Mental status is at baseline.      Cranial Nerves: No cranial nerve deficit.      Motor: No weakness.      Coordination: Coordination normal.      Gait: Gait normal.   Psychiatric:         Mood and Affect: Mood normal.         Behavior: Behavior normal.         Thought Content: Thought content normal.         Judgment: Judgment normal.            CBC w/DIFF  CBC with Diff Latest Ref Rng & Units 05/11/2021 04/27/2021 03/30/2021 03/16/2021 03/02/2021   WBC 4.5 - 11.0 K/UL 6.2 10.5 10.5 10.0 10.7   RBC 4.0 - 5.0 M/UL 2.88(L) 2.77(L) 3.02(L) 3.12(L) 3.02(L)   HGB 12.0 - 15.0 GM/DL 1.6(X) 0.9(U) 10.2(L) 10.6(L) 10.1(L)   HCT 36 - 45 % 29.6(L) 28.6(L) 30.7(L) 31.7(L) 30.5(L)   MCV 80 - 100 FL 102.9(H) 103.5(H) 101.7(H) 101.5(H) 100.8(H)   MCH 26 - 34 PG 34.4(H) 34.3(H) 33.7 34.1(H) 33.6   MCHC 32.0 - 36.0 G/DL 04.5 40.9 81.1 91.4 78.2   RDW 11 - 15 % 15.0 15.1(H) 14.6 14.8 15.2(H)   PLT 150 - 400 K/UL 408(H) 610(H) 613(H) 484(H) 587(H)   MPV 7 - 11 FL 7.5 7.1 7.4 7.6  7.5   NEUT 41 - 77 % 47 62 64 65 62   ANC 1.8 - 7.0 K/UL 2.90 6.50 6.70 6.50 6.60   LYMA 24 - 44 % 27 19(L) 21(L) 18(L) 16(L)   ALYM 1.0 - 4.8 K/UL 1.70 2.00 2.20 1.80 1.70   MONA 4 - 12 % 19(H) 16(H) 11 16(H) 15(H)   AMONO 0 - 0.80 K/UL 1.20(H) 1.70(H) 1.20(H) 1.60(H) 1.60(H)   EOSA 0 - 5 % 6(H) 2 3 1  6(H)   AEOS 0 - 0.45 K/UL 0.40 0.30 0.30 0.10 0.60(H)   BASA 0 - 2 % 1 1 1  0 1   ABAS 0 - 0.20 K/UL 0.00 0.10 0.20 0.00 0.10     Comprehensive Metabolic Profile  CMP Latest Ref Rng & Units 05/11/2021 04/27/2021 03/30/2021 03/16/2021 03/02/2021   NA 137 - 147 MMOL/L 139 139 138 135(L) 139   K 3.5 - 5.1 MMOL/L 4.1 4.5 4.3 3.8 4.0   CL 98 - 110 MMOL/L 102 106 98 87(L) 101   CO2 21 - 30 MMOL/L 28 26 30  36(H) 30   GAP 3 - 12 9 7 10 12 8    BUN 7 - 25 MG/DL 16(X) 21 09(U) 04(V) 40(J)   CR 0.4 - 1.00 MG/DL 8.11(B) 1.47(W) 2.95(A) 3.56(H) 1.97(H)   GLUX 70 - 100 MG/DL 87 86 91 213(Y) 95   CA 8.5 - 10.6 MG/DL 9.1 9.3 9.8 86.5 9.6   TP 6.0 - 8.0 G/DL 7.5 7.4 7.8(I) 6.9(G) 8.0   ALB 3.5 - 5.0 G/DL 3.9 3.7 3.7 4.1 3.7   ALKP 25 - 110 U/L 387(H) 411(H) 364(H) 469(H) 439(H)   ALT 7 - 56 U/L 39 35 33 56 68(H)   TBILI 0.3 - 1.2 MG/DL 0.5 0.6 0.4 2.9(B) 0.7   GFR >60 mL/min - - - - -   GFRAA >60 mL/min - - - - -     Tumor Markers  Lab Results   Component Value Date    CEA 388.5 (H) 04/27/2021    CEA 983.2 (H) 03/30/2021    CEA 1,179.5 (H) 03/16/2021    CEA 1,276.6 (H) 03/02/2021    CEA 1,616.3 (H) 02/16/2021    CEA 1,825.6 (H) 01/26/2021    CEA 1,504.7 (H) 01/12/2021    CEA 1,815.4 (H) 12/29/2020    CEA 1,749.5 (H) 12/15/2020    CEA 1,403.1 (H) 08/17/2020    CA199 <1 04/27/2021    CA199 <1 03/30/2021    CA199 <1 03/16/2021    CA199 <1 03/02/2021    CA199 <1 02/16/2021    CA199 <1 01/26/2021    CA199 <1 01/12/2021    CA199 <1 12/29/2020    CA199 <1 12/15/2020    CA199 <1 06/25/2017    CA125 41 (H) 01/21/2018    CA125 42 (H) 11/21/2017    CA125 46 (H) 11/05/2017    CA125 41 (H) 10/22/2017    CA125 34 10/08/2017    CA125 34 09/24/2017    CA125 31 09/10/2017    CA125 29 08/27/2017    CA125 34 08/15/2017    CA125 39 (H) 07/30/2017           Imaging:  - 04/27/21 CT CAP without contrast  CHEST:   1. ?No significant change in small indeterminate anterior epiphrenic lymph nodes. No new thoracic lymphadenopathy.   2. No significant change to further mild increase in size of pulmonary and pleural metastases.   3. Increasing nodular and tree-in-bud opacities in the  lingula and development of mild focal consolidation in the left lower lobe, indeterminate and may be infectious/inflammatory nodules or potentially pulmonary metastases. ?     ABDOMEN AND PELVIS:   1. ?Grossly unchanged extensive peritoneal carcinomatosis.   2. No significant change in small residual indeterminate retroperitoneal lymph nodes. No discrete new abdominopelvic lymphadenopathy.   3. Grossly unchanged right anterior abdominal wall metastases.     CT CHEST WO CONTRAST  Narrative: CT CHEST, ABDOMEN AND PELVIS    Clinical Indication: Female, 66 years old. Cancer of appendix. Peritoneal carcinomatosis.    Technique: Multiple contiguous axial images were obtained through the chest, abdomen and pelvis without IV contrast material. Post processing coronal and sagittal reconstruction images were made from the axial images.      IV contrast: None  Bowel contrast:  None    Comparison: Noncontrast CT chest, abdomen, and pelvis on 02/16/2021.    CHEST FINDINGS:    Evaluation of the mediastinum and hila, including the vasculature and for lymphadenopathy, is limited without the use of IV contrast.    Lower Neck: Unremarkable.    Axilla, Mediastinum and Hila: No significant change in clustered indeterminate epiphrenic lymph nodes (series 2 image 82). No new thoracic lymphadenopathy.     Heart and Great Vessels: Left subclavian chest port terminates in the SVC. Heart size normal. At least mild coronary artery calcifications. Thoracic aorta normal in caliber. Mild calcified atherosclerotic plaque.    Airway, Lungs and Pleura: Several areas of peripheral mucous plugging in the lingula. Minimal scarring and/or atelectasis. Development of mild focal consolidation in the dependent left lower lobe (series 2 image 55). Increase in focal tree-in-bud and nodular opacities in the lingula with adjacent areas of mucus plugging (for example series 2 images 65 through 71). No pleural effusion. No significant change to further mild increase in size of pulmonary and pleural metastases (for example series 2 images 22 and 29). Previously measured metastases as follows:    Right upper lobe, 2.2 cm (series 2 image 13) previously 1.5 cm.  Right lower lobe, 1 cm (series 2 image 35) previously 1 cm.    Chest Wall and Osseous Structures: Lower cervical and mild thoracic spondylosis. No destructive osseous lesion.     Abdomen and Pelvis Findings:    Limited evaluation without the use of IV contrast which includes the viscera and vasculature.    Liver and Biliary system: Liver is normal in size. Persistent scalloping of the hepatic margins from peritoneal metastases. Unchanged right hepatic cyst. No obvious new parenchymal hepatic mass on noncontrast CT. Persistent intrahepatic biliary ductal dilatation, greatest in the left lobe, likely from periportal peritoneal metastases.     Spleen: Splenectomy.    Adrenal Glands and Kidneys: Adrenal glands unremarkable. Several subcentimeter renal calculi. No hydronephrosis.    Pancreas and Retroperitoneum: Unopacified pancreas is partially obscured due to extensive peritoneal metastases. No significant change in small indeterminate retroperitoneal lymph nodes. No new lymphadenopathy.    Aorta and Major Vessels: Normal caliber abdominal aorta. Mild calcified atherosclerotic plaque.     Bowel, Mesentery and Peritoneal space: Distal colonic anastomosis. Omentectomy. Right lower quadrant ostomy. No bowel obstruction. Overall no significant change in extensive peritoneal metastases, many of which are partially calcified.    Pelvis: Hysterectomy. Persistent gas and fluid distention of the vaginal cuff. Urinary bladder is incompletely distended and not well evaluated. No discrete new pelvic lymphadenopathy.    Abdominal wall and Osseous Structures: Midline ventral abdominal incision scar. Grossly unchanged right anterior abdominal wall  metastases. Previously measured subcutaneous right ventral abdominal wall metastasis measures 1.9 x 1.6 cm (series 2 image 141) disease previously 1.9 x 1.6 cm. No destructive osseous lesion.  Impression: CHEST:  1.  No significant change in small indeterminate anterior epiphrenic lymph nodes. No new thoracic lymphadenopathy.    2. No significant change to further mild increase in size of pulmonary and pleural metastases.    3. Increasing nodular and tree-in-bud opacities in the lingula and development of mild focal consolidation in the left lower lobe, indeterminate and may be infectious/inflammatory nodules or potentially pulmonary metastases.      ABDOMEN AND PELVIS:  1.  Grossly unchanged extensive peritoneal carcinomatosis.    2. No significant change in small residual indeterminate retroperitoneal lymph nodes. No discrete new abdominopelvic lymphadenopathy.    3. Grossly unchanged right anterior abdominal wall metastases.     Finalized by Ancil Boozer, M.D. on 04/27/2021 8:58 AM. Dictated by Ancil Boozer, M.D. on 04/27/2021 8:28 AM.  CT ABD/PELV WO CONTRAST  Narrative: CT CHEST, ABDOMEN AND PELVIS    Clinical Indication: Female, 66 years old. Cancer of appendix. Peritoneal carcinomatosis.    Technique: Multiple contiguous axial images were obtained through the chest, abdomen and pelvis without IV contrast material. Post processing coronal and sagittal reconstruction images were made from the axial images.      IV contrast: None  Bowel contrast:  None    Comparison: Noncontrast CT chest, abdomen, and pelvis on 02/16/2021.    CHEST FINDINGS:    Evaluation of the mediastinum and hila, including the vasculature and for lymphadenopathy, is limited without the use of IV contrast.    Lower Neck: Unremarkable.    Axilla, Mediastinum and Hila: No significant change in clustered indeterminate epiphrenic lymph nodes (series 2 image 82). No new thoracic lymphadenopathy.     Heart and Great Vessels: Left subclavian chest port terminates in the SVC. Heart size normal. At least mild coronary artery calcifications. Thoracic aorta normal in caliber. Mild calcified atherosclerotic plaque.    Airway, Lungs and Pleura: Several areas of peripheral mucous plugging in the lingula. Minimal scarring and/or atelectasis. Development of mild focal consolidation in the dependent left lower lobe (series 2 image 55). Increase in focal tree-in-bud and nodular opacities in the lingula with adjacent areas of mucus plugging (for example series 2 images 65 through 71). No pleural effusion. No significant change to further mild increase in size of pulmonary and pleural metastases (for example series 2 images 22 and 29). Previously measured metastases as follows:    Right upper lobe, 2.2 cm (series 2 image 13) previously 1.5 cm.  Right lower lobe, 1 cm (series 2 image 35) previously 1 cm.    Chest Wall and Osseous Structures: Lower cervical and mild thoracic spondylosis. No destructive osseous lesion.     Abdomen and Pelvis Findings:    Limited evaluation without the use of IV contrast which includes the viscera and vasculature.    Liver and Biliary system: Liver is normal in size. Persistent scalloping of the hepatic margins from peritoneal metastases. Unchanged right hepatic cyst. No obvious new parenchymal hepatic mass on noncontrast CT. Persistent intrahepatic biliary ductal dilatation, greatest in the left lobe, likely from periportal peritoneal metastases.     Spleen: Splenectomy.    Adrenal Glands and Kidneys: Adrenal glands unremarkable. Several subcentimeter renal calculi. No hydronephrosis.    Pancreas and Retroperitoneum: Unopacified pancreas is partially obscured due to extensive peritoneal metastases. No significant change in small indeterminate retroperitoneal lymph nodes. No  new lymphadenopathy.    Aorta and Major Vessels: Normal caliber abdominal aorta. Mild calcified atherosclerotic plaque.     Bowel, Mesentery and Peritoneal space: Distal colonic anastomosis. Omentectomy. Right lower quadrant ostomy. No bowel obstruction. Overall no significant change in extensive peritoneal metastases, many of which are partially calcified.    Pelvis: Hysterectomy. Persistent gas and fluid distention of the vaginal cuff. Urinary bladder is incompletely distended and not well evaluated. No discrete new pelvic lymphadenopathy.    Abdominal wall and Osseous Structures: Midline ventral abdominal incision scar. Grossly unchanged right anterior abdominal wall metastases. Previously measured subcutaneous right ventral abdominal wall metastasis measures 1.9 x 1.6 cm (series 2 image 141) disease previously 1.9 x 1.6 cm. No destructive osseous lesion.  Impression: CHEST:  1.  No significant change in small indeterminate anterior epiphrenic lymph nodes. No new thoracic lymphadenopathy.    2. No significant change to further mild increase in size of pulmonary and pleural metastases.    3. Increasing nodular and tree-in-bud opacities in the lingula and development of mild focal consolidation in the left lower lobe, indeterminate and may be infectious/inflammatory nodules or potentially pulmonary metastases.      ABDOMEN AND PELVIS:  1.  Grossly unchanged extensive peritoneal carcinomatosis.    2. No significant change in small residual indeterminate retroperitoneal lymph nodes. No discrete new abdominopelvic lymphadenopathy.    3. Grossly unchanged right anterior abdominal wall metastases.     Finalized by Ancil Boozer, M.D. on 04/27/2021 8:58 AM. Dictated by Ancil Boozer, M.D. on 04/27/2021 8:28 AM.        ASSESSMENT AND PLAN:    1. Metastatic appendiceal carcinoma. Signet ring adenocarcinoma, MSS, with peritoneal carcinomatosis with  pseudomyxoma peritonei.  02/19/17 diagnostic laparoscopy with biopsies of the right diaphragm, right liver, right fallopian tube and left pelvis. 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  Planned for exploratory laparotomy with CRS/HIPEC in combination with a TAH/BSO in 03/2017.  Developed intestinal obstruction and taken to OR for CRS/HIPEC surgery; however multiple adhesions, omental cracking. CRS/HIPEC was aborted.  Immunotherapy on epacadostat?+ sirolimus?trial 12/24/17-05/21/18.  She stopped study drug on 05/21/18 for mitomycin HIPEC trial at [facility] [place].  On 05/28/18 diagnostic laparoscopy, evacuation of mucinous ascites 1.5L, peritoneal biopsy, laparoscopic HIPEC with mitomycin-C in [place] by Dr. Daryl Eastern.  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  Completed second laparoscopic HIPEC on 06/25/18.  Again 1.5L removed.  09/12/18 underwent debulking at [facility] in [place], [place].  Removed 98% by Dr. [name].  Discharged on 09/30/18.  She had L pleural effusion requiring chest tube drainage; intra-abd abscess 10/2018; discharged on 11/04/18 with drain and IV cefepime and Diflucan. Her ureteral stents were removed and ostomy relocated in 10/2018.  Hospitalized in 01/2019 for AKI 2/2 dehydration and likely PE (V/Q scan and trop elevation). CEA increasing again. Repeat CT CAP 03/20/19 with stable to slight increase in cancer and RUL nodule.  Discussed observation vs treatment. She chose observation and is considering repeat CRS + HIPEC via Dr. [name] in [place].  CT scans from 09/24/19 of the abdomen, without contrast, appear stable.  The peritoneal thickening, carcinomatosis, retroperitoneal lymph nodes, a small amount of fluid collection or trace ascites, appear to be stable compared to 3 months ago. 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  CT scan on 08/17/20 showed a right lung mass that is slightly increased from 11 mm to 14 mm. There are some new lung nodules that have developed, a small one in the  left lung, a new nodule in the right lung. Inside the abdomen we are seeing continued increase in size of the peritoneal nodules. The largest measuring 14 mm compared to 12 mm.  The nodule lower in the pelvis increased from 16 mm to 21 mm.  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  TMB = 1.3.  S/p CRS w/ ostomy revision and tumor removal around stoma on 10/14/20.  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 [name] PA from [facility] in [place] called to relay their TB recommendations for patient.  Recommendations to proceed with FOLFIRI/Avastin with holding Avastin 6-8 weeks post-op. They also request if our office can repeat genomic testing.  Repeat TEMPUS from 10/14/20.  Pre-screen for KRAS G12D inhibitor trial, if not eligible will plan for FOLFIRI +/- bevacizumab. Tolerated C1 FOLFIRI but more diarrhea and N/V after C2. Decreased irinotecan to 130 mg/m2 due to diarrhea. S/p C4, with bevacizumab added on. CT scan 02/16/21 abdominal wall spots are under control and stable. There is a slight increase in size of the lung nodules. The radiologist is measuring 10 mm to 8 mm in 11/2020. The other right lung nodule is more or less the same at 15 mm. CT scans with stable disease, mild increase in size of pulmonary metastases (specifically the right upper lobe nodule 2.2cm, previously 1.5cm). There is also some increasing nodular and tree-in-bud opacities in the lingula and development of mild focal consolidation in the left lower lobe, indeterminate but may be related to recent COVID infection vs pulmonary metastases. Given overall stability and down-trending CEA, we will plan to continue with the current treatment regimen for now.    Plan 05/11/21:  Tolerating treatment well. Labs stable.  Proceed with C9 FOLFIRI + bevacizumab today (irinotecan 130 mg/m2, 5-FU 2400 mg/m2, avastin) with IVF.  Continue to get IV fluids and pump disconnect in [place].  Dr. Dorita Fray with Radiation Oncology reviewed imaging (already received 50Gy to right lung 3/1-05/25/20). Continue to monitor.   Return to clinic in 2 weeks with labs prior to consideration of C10 FOLFIRI + bevacizumab.  Plan for repeat imaging in 3 months.  Continue supportive measures.    2. Elevated creatinine, s/p ureteral stent. Her ureteral stents were removed 10/2018. Encouraged hydration. S/p IVF 1L NS and 1g Mg weekly x 4 weeks. Referred to Nephrology as Cr is improving with IVF. She is following with Dr. Marjie Skiff in Nephrology. Cr 2.48 after GI virus. Decreased irinotecan to 130 mg/m2. Continue IV fluids 3 times a week locally, as we anticipate more diarrhea with irinotecan. Creatinine was greater than 4 with infection. Cr 1.86 today. Proceed with treatment as scheduled with IV fluid support.    3. Diarrhea. Better with Lomotil daily. Continue IV fluids 3 times a week locally. Increased her Lomotil to 2 tablets 4 times daily. She will continue to take Metamucil to bulk up her stools. She had a day of decreased stool output but was able to flush the Metamucil out with an enema. Bowels are back to baseline now. Continue to take Imodium as needed, okay to increase to 2 tabs at a time as needed. Better with advanced diet.     4. Nausea. From chemo. Worse after possible GI virus. Tried Compazine. Resolved now that infections have cleared. Chemo as above, continue antiemetics, call if not controlled. Less with last chemo.     5. Fatigue. Improved  now that infections are better.    6. Urinary tract infection. Treated by Abx via PCP. Diagnosed with another UTI in [place] s/p 10 days of cefdinir. Agree with Urology consult per PCP given recurrent infections.    7. Skin lesion. Blister under ostomy appliance. Better with belt change. Ok to try New Skin liquid bandage and check with ostomy nurse for other options.     Turner Daniels, APRN-NP        The patient and family were allowed to ask questions and voice concerns; these were addressed to the best of our ability. They expressed understanding of what was explained to them, and they agreed with the present plan. RTC in 2 weeks with labs to see a provider. Patient has the phone numbers for the Cancer Center and was instructed on how to contact us with any questions or concerns. My collaborating physician on this case is Dr. Josiah Lobo.

## 2021-05-11 NOTE — Progress Notes
CHEMO NOTE  Verified chemo consent signed and in chart.    Blood return positive via: Port (Single)    BSA and dose double checked (agree with orders as written) with: yes     Labs/applicable tests checked: CBC and Comprehensive Metabolic Panel (CMP)    Chemo regimen:   bevacizumab-awwb (MVASI) 260.5 mg in sodium chloride 0.9% (NS) 110.42 mL IVPB     irinotecan (CAMPTOSAR) 196.4 mg in dextrose 5% (D5W) 509.82 mL IVPB     fluorouraciL (ADRUCIL) 3,624 mg in sodium chloride 0.9% (NS) 92 mL IV amb pump       Rate verified and armband double check with second RN: yes    Patient education offered and stated understanding. Denies questions at this time.    Patient tolerated treatment well and left clinic ambulatory with pump infusing.

## 2021-05-13 ENCOUNTER — Encounter: Admit: 2021-05-13 | Discharge: 2021-05-13 | Payer: MEDICARE

## 2021-05-19 ENCOUNTER — Encounter: Admit: 2021-05-19 | Discharge: 2021-05-19 | Payer: MEDICARE

## 2021-05-24 NOTE — Progress Notes
Date of Service: 05/25/2021    SUBJECTIVE:             Reason for Visit:  No chief complaint on file.    Vicki Buchanan is a 66 y.o. 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  02/19/17 diagnostic laparoscopy with biopsies of the right diaphragm, right liver, right fallopian tube and left pelvis. 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  Planned for exploratory laparotomy with CRS/HIPEC in combination with a TAH/BSO in 03/2017.  Developed intestinal obstruction and taken to OR for CRS/HIPEC surgery; however multiple adhesions, omental cracking. CRS/HIPEC was aborted.  Immunotherapy on epacadostat?+ sirolimus?trial 12/24/17-05/21/18.  She stopped study drug on 05/21/18 for mitomycin HIPEC trial at [facility] [place].  On 05/28/18 diagnostic laparoscopy, evacuation of mucinous ascites 1.5L, peritoneal biopsy, laparoscopic HIPEC with mitomycin-C in [place] by Dr. Daryl Eastern.  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  Completed second laparoscopic HIPEC on 06/25/18.  Again 1.5L removed.  09/12/18 underwent debulking at [facility] in [place], [place].  Removed 98% by Dr. [name].  Discharged on 09/30/18.  She had L pleural effusion requiring chest tube drainage; intra-abd abscess 10/2018; discharged on 11/04/18 with drain and IV cefepime and Diflucan. Her ureteral stents were removed and ostomy relocated in 10/2018.  Hospitalized in 01/2019 for AKI 2/2 dehydration and likely PE (V/Q scan and trop elevation). CEA increasing again. Repeat CT CAP 03/20/19 with stable to slight increase in cancer and RUL nodule.  Discussed observation vs treatment. She chose observation and is considering repeat CRS + HIPEC via Dr. [name] in [place].  CT scans from 09/24/19 of the abdomen, without contrast, appear stable.  The peritoneal thickening, carcinomatosis, retroperitoneal lymph nodes, a small amount of fluid collection or trace ascites, appear to be stable compared to 3 months ago. 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  CT scan on 08/17/20 showed a right lung mass that is slightly increased from 11 mm to 14 mm. There are some new lung nodules that have developed, a small one in the left lung, a new nodule in the right lung. Inside the abdomen we are seeing continued increase in size of the peritoneal nodules. The largest measuring 14 mm compared to 12 mm.  The nodule lower in the pelvis increased from 16 mm to 21 mm.  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  TMB = 1.3.  S/p CRS w/ ostomy revision and tumor removal around stoma on 10/14/20.  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 [name] PA from [facility] in [place] called to relay their TB recommendations for patient.  Recommendations to proceed with FOLFIRI/Avastin with holding Avastin 6-8 weeks post-op. They also request if our office can repeat genomic testing.  Repeat TEMPUS from 10/14/20.  Pre-screen for KRAS G12D inhibitor trial, if not eligible will plan for FOLFIRI +/- bevacizumab.       Interval Hx 05/25/21:  Pt had a bowel blockage on Friday due to metamucil. She vomited and finally got it cleared. She is feeling better now. She got IF and is turning around again. She has more fatigue with chemo. She is also having heartburn for days after chemo. pepcid is not controlling any more.        Review of Systems   Constitutional: Positive for fatigue and unexpected weight  change. Negative for activity change, appetite change, chills, diaphoresis and fever.   HENT: Negative for facial swelling, mouth sores, sore throat, trouble swallowing and voice change.    Eyes: Negative.  Negative for visual disturbance.   Respiratory: Negative for cough, chest tightness, shortness of breath and wheezing.    Cardiovascular: Negative.  Negative for chest pain, palpitations and leg swelling.   Gastrointestinal: Positive for constipation, diarrhea and vomiting. Negative for abdominal distention, abdominal pain, anal bleeding, blood in stool, nausea and rectal pain.   Genitourinary: Negative for difficulty urinating, dysuria and hematuria.   Musculoskeletal: Negative.  Negative for arthralgias, back pain, neck pain and neck stiffness.   Skin: Negative for color change, pallor, rash and wound (ulceration near stoma healed).   Neurological: Negative for dizziness, weakness, light-headedness, numbness and headaches.   Hematological: Does not bruise/bleed easily.   Psychiatric/Behavioral: Negative for agitation. The patient is not nervous/anxious.        Medical History:   Diagnosis Date   ? Bundle branch block    ? Cancer [facility])    ? Cancer of appendix [facility])      Surgical History:   Procedure Laterality Date   ? CESAREAN SECTION  1981   ? HX CHOLECYSTECTOMY  1995   ? LAPAROSCOPY  02/2017    biopsy peritoneal   ? COLONOSCOPY DIAGNOSTIC WITH SPECIMEN COLLECTION BY BRUSHING/ WASHING - FLEXIBLE N/A 03/19/2017    Performed by Everardo All, MD at [facility] ENDO   ? COLONOSCOPY WITH BIOPSY - FLEXIBLE  03/19/2017    Performed by Everardo All, MD at [facility] ENDO   ? EXPLORATORY LAPAROTOMY, DIVERTING LOOP ILEOSTOMY N/A 04/03/2017    Performed by Loralie Champagne, MD at [facility] OR   ? INSERTION TUNNELED CENTRAL VENOUS CATHETER - AGE 64 YEARS AND OVER Left 04/23/2017    Performed by Loralie Champagne, MD at [facility] OR   ? CYSTOURETHROSCOPY WITH INDWELLING URETERAL STENT INSERTION Bilateral 04/09/2018    Performed by Earl Many, MD at [facility] OR   ? CYSTOURETHROSCOPY WITH URETERAL CATHETERIZATION WITH/ WITHOUT IRRIGATION/ INSTILLATION/ URETEROPYELOGRAPHY Bilateral 04/09/2018    Performed by Earl Many, MD at [facility] OR   ? CYSTOURETHROSCOPY WITH INDWELLING URETERAL STENT EXCHANGE (RIGHT 6 x 26cm / LEFT 6 x 28cm) Bilateral 08/08/2018    Performed by Earl Many, MD at [facility] OR   ? RETROGRADE UROGRAPHY WITH/ WITHOUT KUB Bilateral 08/08/2018    Performed by Earl Many, MD at [facility] OR   ? ILEOSTOMY OR JEJUNOSTOMY Right    ? TUNNELED VENOUS PORT PLACEMENT Left    ? URETER STENT PLACEMENT Bilateral      Family History   Problem Relation Age of Onset   ? Diabetes Mother    ? Cancer Father    ? Diabetes Brother    ? Cancer-Lung Paternal Aunt    ? Cancer-Breast Maternal Grandmother    ? Diabetes Paternal Grandmother      Social History     Socioeconomic History   ? Marital status: Married   Tobacco Use   ? Smoking status: Never   ? Smokeless tobacco: Never   Vaping Use   ? Vaping Use: Never used   Substance and Sexual Activity   ? Alcohol use: No     Comment: rarely   ? Drug use: No         OBJECTIVE:         ? cefdinir (OMNICEF) 300 mg capsule Take one capsule by mouth. FOR 10 DAYS   ?  Cetirizine (ZYRTEC) 10 mg cap Take one capsule by mouth as Needed.   ? dexAMETHasone (DECADRON) 4 mg tablet 1 tab by mouth with breakfast and afternoon snack on days 2 & 3 following each chemotherapy cycle. Do not take after 6 pm to avoid insomnia.   ? diphenoxylate-atropine (LOMOTIL) 2.5-0.025 mg tablet Take one tablet by mouth four times daily as needed for Diarrhea.   ? famotidine (PEPCID AC) 10 mg tablet Take one tablet by mouth twice daily.   ? loperamide (IMODIUM A-D) 2 mg capsule Take one pill by mouth four times daily as needed   ? LORazepam (ATIVAN) 0.5 mg tablet Take 1-2 tabs by mouth every 6 hrs as needed N/V not controlled by Zofran or Compazine. May also use every 6 hrs as needed anxiety or at bedtime insomnia.   ? magnesium citrate 200 mg tab Take one tablet by mouth daily.   ? MULTIVITAMIN PO Take 1 tablet by mouth daily.   ? ondansetron (ZOFRAN ODT) 8 mg rapid dissolve tablet Dissolve one tablet by mouth every 8 hours as needed for Nausea or Vomiting. Place on tongue to disolve.   ? opium tincture oral solution Take 0.6 mL by mouth every 6 hours.   ? potassium chloride (KLOR-CON SPRINKLE) 10 mEq capsule Take one capsule by mouth twice daily. If potassium < 3.5. Take with a meal and a full glass of water.   ? prochlorperazine maleate (COMPAZINE) 10 mg tablet 1 tablet by mouth every 6 hours as needed for nausea not controlled by Zofran.   ? psyllium husk (METAMUCIL) 0.4 gram cap    ? sodium chloride 0.9% (NS) 0.9 % 1,000 mL with potassium chloride 2 mEq/mL 20 mEq, magnesium sulfate 4 mEq/mL (50 %) 2 g IV infusion Administer 1L NS with K and 2g Mg once weekly via outpatient infusion.     Vitals:    05/25/21 0934   BP: 124/81   BP Source: Arm, Left Upper   Pulse: 78   Temp: 36.1 ?C (97 ?F)   Resp: 16   SpO2: 100%   TempSrc: Temporal   PainSc: Zero   Weight: 50.3 kg (111 lb)   Height: 156 cm (5' 1.42)     Body mass index is 20.69 kg/m?Marland Kitchen     Pain Score: Zero       Pain Addressed:  N/A    Patient Evaluated for a Clinical Trial: Patient not eligible for a treatment trial (including not needing treatment, needs palliative care, in remission).     [place] Cooperative Oncology Group performance status is 1, Restricted in physically strenuous activity but ambulatory and able to carry out work of a light or sedentary nature, e.g., light house work, office work.       Physical Exam  Vitals reviewed.   Constitutional:       General: She is not in acute distress.     Appearance: Normal appearance. She is well-developed. She is not ill-appearing, toxic-appearing or diaphoretic.   HENT:      Head: Normocephalic and atraumatic.      Nose: Nose normal. No rhinorrhea.      Mouth/Throat:      Mouth: Mucous membranes are moist. Mucous membranes are not pale. No oral lesions.      Pharynx: Oropharynx is clear. No oropharyngeal exudate or posterior oropharyngeal erythema.      Tonsils: No tonsillar abscesses.   Eyes:      General: No scleral icterus.  Right eye: No discharge.         Left eye: No discharge.      Extraocular Movements: Extraocular movements intact.      Conjunctiva/sclera: Conjunctivae normal.      Pupils: Pupils are equal, round, and reactive to light.   Cardiovascular:      Rate and Rhythm: Normal rate and regular rhythm.      Pulses: Normal pulses.      Heart sounds: Normal heart sounds. No murmur heard.    No gallop.   Pulmonary:      Effort: Pulmonary effort is normal. No respiratory distress.      Breath sounds: Normal breath sounds. No stridor. No wheezing, rhonchi or rales.   Chest:      Chest wall: No tenderness.      Comments: Port site WNL  Abdominal:      General: Bowel sounds are normal. There is no distension.      Palpations: Abdomen is soft. There is no mass.      Tenderness: There is no abdominal tenderness. There is no guarding or rebound.      Hernia: No hernia is present.       Musculoskeletal:         General: No swelling, tenderness, deformity or signs of injury. Normal range of motion.      Cervical back: Normal range of motion and neck supple. No rigidity. No muscular tenderness.      Right lower leg: No edema.      Left lower leg: No edema.   Lymphadenopathy:      Cervical: No cervical adenopathy.   Skin:     General: Skin is warm and dry.      Coloration: Skin is not jaundiced or pale.      Findings: No bruising, erythema, lesion or rash.   Neurological:      General: No focal deficit present.      Mental Status: She is alert and oriented to person, place, and time. Mental status is at baseline.      Cranial Nerves: No cranial nerve deficit.      Motor: No weakness.      Coordination: Coordination normal.      Gait: Gait normal.   Psychiatric:         Mood and Affect: Mood normal.         Behavior: Behavior normal.         Thought Content: Thought content normal.         Judgment: Judgment normal.            CBC w/DIFF  CBC with Diff Latest Ref Rng & Units 05/25/2021 05/11/2021 04/27/2021 03/30/2021 03/16/2021   WBC 4.5 - 11.0 K/UL 5.1 6.2 10.5 10.5 10.0   RBC 4.0 - 5.0 M/UL 2.99(L) 2.88(L) 2.77(L) 3.02(L) 3.12(L)   HGB 12.0 - 15.0 GM/DL 10.0(L) 9.9(L) 9.5(L) 10.2(L) 10.6(L)   HCT 36 - 45 % 30.5(L) 29.6(L) 28.6(L) 30.7(L) 31.7(L)   MCV 80 - 100 FL 101.8(H) 102.9(H) 103.5(H) 101.7(H) 101.5(H)   MCH 26 - 34 PG 33.3 34.4(H) 34.3(H) 33.7 34.1(H)   MCHC 32.0 - 36.0 G/DL 16.1 09.6 04.5 40.9 81.1   RDW 11 - 15 % 15.3(H) 15.0 15.1(H) 14.6 14.8   PLT 150 - 400 K/UL 404(H) 408(H) 610(H) 613(H) 484(H)   MPV 7 - 11 FL 7.7 7.5 7.1 7.4 7.6   NEUT 41 - 77 % 39(L) 47 62 64 65   ANC 1.8 -  7.0 K/UL 2.00 2.90 6.50 6.70 6.50   LYMA 24 - 44 % 33 27 19(L) 21(L) 18(L)   ALYM 1.0 - 4.8 K/UL 1.70 1.70 2.00 2.20 1.80   MONA 4 - 12 % 23(H) 19(H) 16(H) 11 16(H)   AMONO 0 - 0.80 K/UL 1.20(H) 1.20(H) 1.70(H) 1.20(H) 1.60(H)   EOSA 0 - 5 % 4 6(H) 2 3 1    AEOS 0 - 0.45 K/UL 0.20 0.40 0.30 0.30 0.10   BASA 0 - 2 % 1 1 1 1  0   ABAS 0 - 0.20 K/UL 0.00 0.00 0.10 0.20 0.00     Comprehensive Metabolic Profile  CMP Latest Ref Rng & Units 05/25/2021 05/11/2021 04/27/2021 03/30/2021 03/16/2021   NA 137 - 147 MMOL/L 139 139 139 138 135(L)   K 3.5 - 5.1 MMOL/L 3.4(L) 4.1 4.5 4.3 3.8   CL 98 - 110 MMOL/L 100 102 106 98 87(L)   CO2 21 - 30 MMOL/L 32(H) 28 26 30  36(H)   GAP 3 - 12 7 9 7 10 12    BUN 7 - 25 MG/DL 98(J) 19(J) 21 47(W) 29(F)   CR 0.4 - 1.00 MG/DL 6.21(H) 0.86(V) 7.84(O) 2.19(H) 3.56(H)   GLUX 70 - 100 MG/DL 962(X) 87 86 91 528(U)   CA 8.5 - 10.6 MG/DL 9.2 9.1 9.3 9.8 13.2   TP 6.0 - 8.0 G/DL 6.9 7.5 7.4 4.4(W) 1.0(U)   ALB 3.5 - 5.0 G/DL 3.6 3.9 3.7 3.7 4.1   ALKP 25 - 110 U/L 299(H) 387(H) 411(H) 364(H) 469(H)   ALT 7 - 56 U/L 43 39 35 33 56   TBILI 0.3 - 1.2 MG/DL 0.5 0.5 0.6 0.4 7.2(Z)   GFR >60 mL/min - - - - -   GFRAA >60 mL/min - - - - -     Tumor Markers  Lab Results   Component Value Date    CEA 629.6 (H) 05/11/2021    CEA 388.5 (H) 04/27/2021    CEA 983.2 (H) 03/30/2021    CEA 1,179.5 (H) 03/16/2021    CEA 1,276.6 (H) 03/02/2021    CEA 1,616.3 (H) 02/16/2021    CEA 1,825.6 (H) 01/26/2021    CEA 1,504.7 (H) 01/12/2021    CEA 1,815.4 (H) 12/29/2020    CEA 1,749.5 (H) 12/15/2020    CA199 <1 05/11/2021    CA199 <1 04/27/2021    CA199 <1 03/30/2021    CA199 <1 03/16/2021    CA199 <1 03/02/2021    CA199 <1 02/16/2021    CA199 <1 01/26/2021    CA199 <1 01/12/2021    CA199 <1 12/29/2020    CA199 <1 12/15/2020    CA125 41 (H) 01/21/2018    CA125 42 (H) 11/21/2017    CA125 46 (H) 11/05/2017    CA125 41 (H) 10/22/2017    CA125 34 10/08/2017    CA125 34 09/24/2017    CA125 31 09/10/2017    CA125 29 08/27/2017    CA125 34 08/15/2017    CA125 39 (H) 07/30/2017           Imaging:  - 04/27/21 CT CAP without contrast  CHEST:   1. ?No significant change in small indeterminate anterior epiphrenic lymph nodes. No new thoracic lymphadenopathy.   2. No significant change to further mild increase in size of pulmonary and pleural metastases.   3. Increasing nodular and tree-in-bud opacities in the lingula and development of mild focal consolidation in the left lower lobe, indeterminate and may be infectious/inflammatory nodules  or potentially pulmonary metastases. ?     ABDOMEN AND PELVIS:   1. ?Grossly unchanged extensive peritoneal carcinomatosis.   2. No significant change in small residual indeterminate retroperitoneal lymph nodes. No discrete new abdominopelvic lymphadenopathy.   3. Grossly unchanged right anterior abdominal wall metastases.     CT CHEST WO CONTRAST  Narrative: CT CHEST, ABDOMEN AND PELVIS    Clinical Indication: Female, 66 years old. Cancer of appendix. Peritoneal carcinomatosis.    Technique: Multiple contiguous axial images were obtained through the chest, abdomen and pelvis without IV contrast material. Post processing coronal and sagittal reconstruction images were made from the axial images.      IV contrast: None  Bowel contrast:  None    Comparison: Noncontrast CT chest, abdomen, and pelvis on 02/16/2021.    CHEST FINDINGS:    Evaluation of the mediastinum and hila, including the vasculature and for lymphadenopathy, is limited without the use of IV contrast.    Lower Neck: Unremarkable.    Axilla, Mediastinum and Hila: No significant change in clustered indeterminate epiphrenic lymph nodes (series 2 image 82). No new thoracic lymphadenopathy.     Heart and Great Vessels: Left subclavian chest port terminates in the SVC. Heart size normal. At least mild coronary artery calcifications. Thoracic aorta normal in caliber. Mild calcified atherosclerotic plaque.    Airway, Lungs and Pleura: Several areas of peripheral mucous plugging in the lingula. Minimal scarring and/or atelectasis. Development of mild focal consolidation in the dependent left lower lobe (series 2 image 55). Increase in focal tree-in-bud and nodular opacities in the lingula with adjacent areas of mucus plugging (for example series 2 images 65 through 71). No pleural effusion. No significant change to further mild increase in size of pulmonary and pleural metastases (for example series 2 images 22 and 29). Previously measured metastases as follows:    Right upper lobe, 2.2 cm (series 2 image 13) previously 1.5 cm.  Right lower lobe, 1 cm (series 2 image 35) previously 1 cm.    Chest Wall and Osseous Structures: Lower cervical and mild thoracic spondylosis. No destructive osseous lesion.     Abdomen and Pelvis Findings:    Limited evaluation without the use of IV contrast which includes the viscera and vasculature.    Liver and Biliary system: Liver is normal in size. Persistent scalloping of the hepatic margins from peritoneal metastases. Unchanged right hepatic cyst. No obvious new parenchymal hepatic mass on noncontrast CT. Persistent intrahepatic biliary ductal dilatation, greatest in the left lobe, likely from periportal peritoneal metastases.     Spleen: Splenectomy.    Adrenal Glands and Kidneys: Adrenal glands unremarkable. Several subcentimeter renal calculi. No hydronephrosis.    Pancreas and Retroperitoneum: Unopacified pancreas is partially obscured due to extensive peritoneal metastases. No significant change in small indeterminate retroperitoneal lymph nodes. No new lymphadenopathy.    Aorta and Major Vessels: Normal caliber abdominal aorta. Mild calcified atherosclerotic plaque.     Bowel, Mesentery and Peritoneal space: Distal colonic anastomosis. Omentectomy. Right lower quadrant ostomy. No bowel obstruction. Overall no significant change in extensive peritoneal metastases, many of which are partially calcified.    Pelvis: Hysterectomy. Persistent gas and fluid distention of the vaginal cuff. Urinary bladder is incompletely distended and not well evaluated. No discrete new pelvic lymphadenopathy.    Abdominal wall and Osseous Structures: Midline ventral abdominal incision scar. Grossly unchanged right anterior abdominal wall metastases. Previously measured subcutaneous right ventral abdominal wall metastasis measures 1.9 x 1.6 cm (series 2 image 141)  disease previously 1.9 x 1.6 cm. No destructive osseous lesion.  Impression: CHEST:  1.  No significant change in small indeterminate anterior epiphrenic lymph nodes. No new thoracic lymphadenopathy.    2. No significant change to further mild increase in size of pulmonary and pleural metastases.    3. Increasing nodular and tree-in-bud opacities in the lingula and development of mild focal consolidation in the left lower lobe, indeterminate and may be infectious/inflammatory nodules or potentially pulmonary metastases.      ABDOMEN AND PELVIS:  1.  Grossly unchanged extensive peritoneal carcinomatosis.    2. No significant change in small residual indeterminate retroperitoneal lymph nodes. No discrete new abdominopelvic lymphadenopathy.    3. Grossly unchanged right anterior abdominal wall metastases.     Finalized by Ancil Boozer, M.D. on 04/27/2021 8:58 AM. Dictated by Ancil Boozer, M.D. on 04/27/2021 8:28 AM.  CT ABD/PELV WO CONTRAST  Narrative: CT CHEST, ABDOMEN AND PELVIS    Clinical Indication: Female, 66 years old. Cancer of appendix. Peritoneal carcinomatosis.    Technique: Multiple contiguous axial images were obtained through the chest, abdomen and pelvis without IV contrast material. Post processing coronal and sagittal reconstruction images were made from the axial images.      IV contrast: None  Bowel contrast:  None    Comparison: Noncontrast CT chest, abdomen, and pelvis on 02/16/2021.    CHEST FINDINGS:    Evaluation of the mediastinum and hila, including the vasculature and for lymphadenopathy, is limited without the use of IV contrast.    Lower Neck: Unremarkable.    Axilla, Mediastinum and Hila: No significant change in clustered indeterminate epiphrenic lymph nodes (series 2 image 82). No new thoracic lymphadenopathy.     Heart and Great Vessels: Left subclavian chest port terminates in the SVC. Heart size normal. At least mild coronary artery calcifications. Thoracic aorta normal in caliber. Mild calcified atherosclerotic plaque.    Airway, Lungs and Pleura: Several areas of peripheral mucous plugging in the lingula. Minimal scarring and/or atelectasis. Development of mild focal consolidation in the dependent left lower lobe (series 2 image 55). Increase in focal tree-in-bud and nodular opacities in the lingula with adjacent areas of mucus plugging (for example series 2 images 65 through 71). No pleural effusion. No significant change to further mild increase in size of pulmonary and pleural metastases (for example series 2 images 22 and 29). Previously measured metastases as follows:    Right upper lobe, 2.2 cm (series 2 image 13) previously 1.5 cm.  Right lower lobe, 1 cm (series 2 image 35) previously 1 cm.    Chest Wall and Osseous Structures: Lower cervical and mild thoracic spondylosis. No destructive osseous lesion.     Abdomen and Pelvis Findings:    Limited evaluation without the use of IV contrast which includes the viscera and vasculature.    Liver and Biliary system: Liver is normal in size. Persistent scalloping of the hepatic margins from peritoneal metastases. Unchanged right hepatic cyst. No obvious new parenchymal hepatic mass on noncontrast CT. Persistent intrahepatic biliary ductal dilatation, greatest in the left lobe, likely from periportal peritoneal metastases.     Spleen: Splenectomy.    Adrenal Glands and Kidneys: Adrenal glands unremarkable. Several subcentimeter renal calculi. No hydronephrosis.    Pancreas and Retroperitoneum: Unopacified pancreas is partially obscured due to extensive peritoneal metastases. No significant change in small indeterminate retroperitoneal lymph nodes. No new lymphadenopathy.    Aorta and Major Vessels: Normal caliber abdominal aorta. Mild calcified atherosclerotic plaque.  Bowel, Mesentery and Peritoneal space: Distal colonic anastomosis. Omentectomy. Right lower quadrant ostomy. No bowel obstruction. Overall no significant change in extensive peritoneal metastases, many of which are partially calcified.    Pelvis: Hysterectomy. Persistent gas and fluid distention of the vaginal cuff. Urinary bladder is incompletely distended and not well evaluated. No discrete new pelvic lymphadenopathy.    Abdominal wall and Osseous Structures: Midline ventral abdominal incision scar. Grossly unchanged right anterior abdominal wall metastases. Previously measured subcutaneous right ventral abdominal wall metastasis measures 1.9 x 1.6 cm (series 2 image 141) disease previously 1.9 x 1.6 cm. No destructive osseous lesion.  Impression: CHEST:  1.  No significant change in small indeterminate anterior epiphrenic lymph nodes. No new thoracic lymphadenopathy.    2. No significant change to further mild increase in size of pulmonary and pleural metastases.    3. Increasing nodular and tree-in-bud opacities in the lingula and development of mild focal consolidation in the left lower lobe, indeterminate and may be infectious/inflammatory nodules or potentially pulmonary metastases.      ABDOMEN AND PELVIS:  1.  Grossly unchanged extensive peritoneal carcinomatosis.    2. No significant change in small residual indeterminate retroperitoneal lymph nodes. No discrete new abdominopelvic lymphadenopathy.    3. Grossly unchanged right anterior abdominal wall metastases.     Finalized by Ancil Boozer, M.D. on 04/27/2021 8:58 AM. Dictated by Ancil Boozer, M.D. on 04/27/2021 8:28 AM.        ASSESSMENT AND PLAN:    1. Metastatic appendiceal carcinoma. Signet ring adenocarcinoma, MSS, with peritoneal carcinomatosis with  pseudomyxoma peritonei.  02/19/17 diagnostic laparoscopy with biopsies of the right diaphragm, right liver, right fallopian tube and left pelvis. 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  Planned for exploratory laparotomy with CRS/HIPEC in combination with a TAH/BSO in 03/2017.  Developed intestinal obstruction and taken to OR for CRS/HIPEC surgery; however multiple adhesions, omental cracking. CRS/HIPEC was aborted.  Immunotherapy on epacadostat?+ sirolimus?trial 12/24/17-05/21/18.  She stopped study drug on 05/21/18 for mitomycin HIPEC trial at [facility] [place].  On 05/28/18 diagnostic laparoscopy, evacuation of mucinous ascites 1.5L, peritoneal biopsy, laparoscopic HIPEC with mitomycin-C in [place] by Dr. Daryl Eastern.  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  Completed second laparoscopic HIPEC on 06/25/18.  Again 1.5L removed.  09/12/18 underwent debulking at [facility] in [place], [place].  Removed 98% by Dr. [name].  Discharged on 09/30/18.  She had L pleural effusion requiring chest tube drainage; intra-abd abscess 10/2018; discharged on 11/04/18 with drain and IV cefepime and Diflucan. Her ureteral stents were removed and ostomy relocated in 10/2018.  Hospitalized in 01/2019 for AKI 2/2 dehydration and likely PE (V/Q scan and trop elevation). CEA increasing again. Repeat CT CAP 03/20/19 with stable to slight increase in cancer and RUL nodule.  Discussed observation vs treatment. She chose observation and is considering repeat CRS + HIPEC via Dr. [name] in [place].  CT scans from 09/24/19 of the abdomen, without contrast, appear stable.  The peritoneal thickening, carcinomatosis, retroperitoneal lymph nodes, a small amount of fluid collection or trace ascites, appear to be stable compared to 3 months ago. 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  CT scan on 08/17/20 showed a right lung mass that is slightly increased from 11 mm to 14 mm. There are some new lung nodules that have developed, a small one in the left lung, a new nodule in the right lung. Inside the abdomen we are seeing continued increase in size of the  peritoneal nodules. The largest measuring 14 mm compared to 12 mm.  The nodule lower in the pelvis increased from 16 mm to 21 mm.  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  TMB = 1.3.  S/p CRS w/ ostomy revision and tumor removal around stoma on 10/14/20.  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 [name] PA from [facility] in [place] called to relay their TB recommendations for patient.  Recommendations to proceed with FOLFIRI/Avastin with holding Avastin 6-8 weeks post-op. They also request if our office can repeat genomic testing.  Repeat TEMPUS from 10/14/20.  Pre-screen for KRAS G12D inhibitor trial, if not eligible will plan for FOLFIRI +/- bevacizumab. Tolerated C1 FOLFIRI but more diarrhea and N/V after C2. Decreased irinotecan to 130 mg/m2 due to diarrhea. S/p C4, with bevacizumab added on. CT scan 02/16/21 abdominal wall spots are under control and stable. There is a slight increase in size of the lung nodules. The radiologist is measuring 10 mm to 8 mm in 11/2020. The other right lung nodule is more or less the same at 15 mm. CT scans with stable disease, mild increase in size of pulmonary metastases (specifically the right upper lobe nodule 2.2cm, previously 1.5cm). There is also some increasing nodular and tree-in-bud opacities in the lingula and development of mild focal consolidation in the left lower lobe, indeterminate but may be related to recent COVID infection vs pulmonary metastases. Given overall stability and down-trending CEA, we will plan to continue with the current treatment regimen for now.    Plan 05/25/21:  Tolerating treatment well. Labs stable.  Symptoms of partial obstruction last Friday. Resolved conservatively at home. Feeling good now.   Proceed with C10 FOLFIRI + bevacizumab today (irinotecan 130 mg/m2, 5-FU 2400 mg/m2, avastin) with IVF.  Continue to get IV fluids and pump disconnect in [place].  Dr. Dorita Fray with Radiation Oncology reviewed imaging (already received 50Gy to right lung 3/1-05/25/20). Continue to monitor.   BP elevated at home, ok today. Monitor with Avastin. May need to start antihypertensives.   Return to clinic in 2 weeks with labs prior to consideration of C10 FOLFIRI + bevacizumab.  Plan for repeat imaging 3 months from last scan  Continue supportive measures.    2. Elevated creatinine, s/p ureteral stent. Her ureteral stents were removed 10/2018. Encouraged hydration. S/p IVF 1L NS and 1g Mg weekly x 4 weeks. Referred to Nephrology as Cr is improving with IVF. She is following with Dr. Marjie Skiff in Nephrology. Cr 2.48 after GI virus. Decreased irinotecan to 130 mg/m2. Continue IV fluids 3 times a week locally, as we anticipate more diarrhea with irinotecan. Creatinine was greater than 4 with infection. Cr 1.86 today. Proceed with treatment as scheduled with IV fluid support.    3. Diarrhea. Better with Lomotil daily. Continue IV fluids 3 times a week locally. Increased her Lomotil to 2 tablets 4 times daily. She will continue to take Metamucil to bulk up her stools. She had a day of decreased stool output but was able to flush the Metamucil out with an enema. Bowels are back to baseline now. Continue to take Imodium as needed, okay to increase to 2 tabs at a time as needed. Better with advanced diet. Obstruction with too much fiber. Managed at home. Now back to baseline.     4. Nausea. From chemo. Worse after possible GI virus. Tried Compazine. Resolved now  that infections have cleared. Chemo as above, continue antiemetics, call if not controlled. Less with last chemo.     5. Fatigue. Improved now that infections are better. More with on-going chemo.         Turner Daniels, APRN-NP        The patient and family were allowed to ask questions and voice concerns; these were addressed to the best of our ability. They expressed understanding of what was explained to them, and they agreed with the present plan. RTC in 2 weeks with labs to see a provider. Patient has the phone numbers for the Cancer Center and was instructed on how to contact us with any questions or concerns. My collaborating physician on this case is Dr. Josiah Lobo.

## 2021-05-24 NOTE — Patient Instructions
Dr. Vanita Ingles MD Medical Oncologist specializing in Gastro-Intestinal malignancies   Lianne Moris APRN    Nurse: Rolla Etienne RN BSN. Phone: [phone] Fax: [phone], available Monday-Friday 8:00am- 4:00pm. After your new patient visit, we will be listening into the clinic appointments via Teams on the iPad. This is the best way to make sure we hear your concerns, the provider's recommendations and do the behind the scenes work to give you the best care.      Medication refills: please contact your pharmacy for medication refills, if they do not have any refills on file they will contact the office, make sure they have our correct contact information on file P: [phone], F: [phone]. Please allow 24 to 48 hours for medication refill requests.     MyChart Messages: All mychart messages are answered by the nurses, even if you select to send the message to Dr. Josiah Lobo or Denny Peon.  We often communicate with Dr. Josiah Lobo and Denny Peon on how to answer these messages, but all messages will come from the nurses.  Messages are answered 8:00am-4:00pm Monday-Friday, holidays excluded.    Phone Calls: We are hardly ever sitting at our desks where our phone is located as we are in clinic most days during the week. Please leave [place] a message as we check our messages several times per day.  It is our goal to answer these messages as soon as possible.  Messages are answered from 8:00am-4:00 pm Monday-Friday, holidays excluded.  Please make sure you leave your full name with the spelling, date of birth, reason for your call, and return number when leaving a message.     *For each time you contact us for assistance, we advise that you use one source of communication as we have multiple nurses helping with mychart messages and phone calls*    FMLA/paperwork: please allow 5-7 business days for FMLA/paperwork to be filled out so you can get the leave you need.     General Symptom Management Information:    Diarrhea:  Instructions for over the counter Imodium A-D  Take 2 loperamide (Imodium) with the first diarrhea episode, take 1 tablet with next diarrhea episode.  If diarrhea persists, take 1 loperamide (Imodium) every 2 hours during the day.  If you are still having diarrhea at night ,take 2 loperamind (Imodium) every 4 hours during the night, then go back to taking 1 tablet every 2 hours in the morning.   **Stop taking once 12 hours have passed with no diarrhea**    Constipation:   Over the counter instructions for Senekot and Miralax  Take 2 Senekot-S tablets or one capful of Miralax.  If you don?t have bowel movement after 2-3 days go to step 2.  If you do have a bowel movement, continue to take 1-2 Senekot-S tablets daily or one capful of Miralax daily.  Take 2 Senekot-S tablets or one capful of Miralax twice a day.    If you do not have a bowel movement in 1-2 days move to step 3.  Add two tablespoons of Milk of Magnesia followed by lots of water OR drink half a bottle of Magnesium Citrate, which is available over the counter    If you do not have a bowel movement after following this regimen, please call [phone] or send a MyChart message and we will get back to you as soon as possible for further assistance.     Nausea and Vomiting:  Please follow the regimen your provider has given you  and take prescriptions as directed.  If you are still experiencing these symptoms after following your regimen, please page [place] at the number above.      Use the following medications after each dose of chemo:  Prescription instructions following chemotherapy for Nausea and Vomiting    Decadron (dexamethasone): 4mg  tab. Take 1 tablet by mouth 2 times a day with meals on days 2-3 following chemotherapy (breakfast and after lunch). Do not take after 6pm to avoid trouble sleeping.    Zyprexa (Olanzapine): 5-10 mg tab. Take 1 tablet by mouth at bedtime days 1-4 of each chemo cycle  Zofran (ondansetron): 8 mg tab. Take 1 tab every 8 hours as needed for nausea and vomiting.  Compazine (prochlorperazine): 10 mg tab. 1 tablet by mouth every 6 hours as needed for nausea not controlled by Zofran.  Ativan (lorazepam): 0.5mg  tab. Take 1 tab by mouth every 6 hours as needed for nausea and vomiting not relieved by Zofran or compazine.  May also use every 6 hours for insomnia and anxiety. May also use one tab at bedtime as needed for insomnia.    Pain:  Please follow the regimen your provider has given you and take prescriptions as directed. If your pain is still unrelieved after following your regimen, please contact us. Our goal is for your pain to be controlled.  Since most pain medications are controlled substances, we are unable to call them into the pharmacy. They must be sent electronically by your provider with secure access. This may take several hours or days. It is recommended that you notify us when you will be out of medication 3+ days.    If your symptoms are relieved by utilizing the above regimens, there is no need to call or message.    **Page immediately during business hours to report any of the following, by calling the cancer center operator at [phone] and asking to have Dr. Stephens November nurses paged:**  Temperature of 100.5 or greater  Any signs/symptoms of infections: redness, swelling, warmth or tenderness  Shortness of breath that is new  Swelling of arms or legs  Uncontrolled pain or nausea/vomiting.     **If you are having difficulty breathing, chest pain, change in mental status or have lost consciousness, fallen and sustained an injury, severe pain, proceed to your closest emergency department. If your family is not able to take you, call 911 for an ambulance. Do not call or message and wait for instructions. If life threatening, GO TO THE CLOSEST ED!**    If you or anyone who accompanies you to your appointment have a history of falls or needs extra help getting into the cancer center, please utilize our free valet service at the front entrance of the Cancer Center at [facility] and [place].  We also have transportation assistance including wheelchairs at the front entrance.    Medical Records:  In regards to the transfer of medical records from facility to facility, please contact the Medical Records department at [facility] to have this completed.  Due to patient care needs and coordinating clinic, the nurses are not able to fax clinic notes, labs and imaging results to different providers, but Medical Records would be glad to do that for you.  Please contact Medical Records at [phone], their fax number is [phone].    Currently, during the Covid pandemic, patients are allowed a visitor in the exam clinic room but not in the chemotherapy treatment area. Unfortunately, there is not  enough room to safely accommodate and safely distance in the treatment area. We will keep you updated as changes are made.         Thank you for choosing the [facility] for your Oncology needs.

## 2021-05-25 ENCOUNTER — Encounter: Admit: 2021-05-25 | Discharge: 2021-05-25 | Payer: MEDICARE

## 2021-05-25 DIAGNOSIS — C801 Malignant (primary) neoplasm, unspecified: Secondary | ICD-10-CM

## 2021-05-25 DIAGNOSIS — C786 Secondary malignant neoplasm of retroperitoneum and peritoneum: Secondary | ICD-10-CM

## 2021-05-25 DIAGNOSIS — C181 Malignant neoplasm of appendix: Secondary | ICD-10-CM

## 2021-05-25 DIAGNOSIS — I454 Nonspecific intraventricular block: Secondary | ICD-10-CM

## 2021-05-25 MED ORDER — SODIUM CHLORIDE 0.9 % IV SOLP
1000 mL | Freq: Once | INTRAVENOUS | 0 refills | Status: CP
Start: 2021-05-25 — End: ?
  Administered 2021-05-25: 17:00:00 1000 mL via INTRAVENOUS

## 2021-05-25 MED ORDER — BEVACIZUMAB-AWWB IVPB
5 mg/kg | Freq: Once | INTRAVENOUS | 0 refills | Status: CP
Start: 2021-05-25 — End: ?
  Administered 2021-05-25 (×2): 260.5 mg via INTRAVENOUS

## 2021-05-25 MED ORDER — PALONOSETRON 0.25 MG/5 ML IV SOLN
.25 mg | Freq: Once | INTRAVENOUS | 0 refills | Status: CP
Start: 2021-05-25 — End: ?
  Administered 2021-05-25: 17:00:00 0.25 mg via INTRAVENOUS

## 2021-05-25 MED ORDER — DEXAMETHASONE 6 MG PO TAB
12 mg | Freq: Once | ORAL | 0 refills | Status: CP
Start: 2021-05-25 — End: ?
  Administered 2021-05-25: 17:00:00 12 mg via ORAL

## 2021-05-25 MED ORDER — ESOMEPRAZOLE MAGNESIUM 40 MG PO CPDR
40 mg | ORAL_CAPSULE | Freq: Every day | ORAL | 3 refills | 30.00000 days | Status: AC
Start: 2021-05-25 — End: ?

## 2021-05-25 MED ORDER — IRINOTECAN IVPB
130 mg/m2 | Freq: Once | INTRAVENOUS | 0 refills | Status: CP
Start: 2021-05-25 — End: ?
  Administered 2021-05-25 (×2): 196.4 mg via INTRAVENOUS

## 2021-05-25 MED ORDER — FLUOROURACIL IV AMB PUMP
2400 mg/m2 | Freq: Once | INTRAVENOUS | 0 refills | Status: CP
Start: 2021-05-25 — End: ?
  Administered 2021-05-25 (×3): 3624 mg via INTRAVENOUS

## 2021-05-25 MED ORDER — ATROPINE 0.4 MG/ML IJ SOLN
.4 mg | INTRAVENOUS | 0 refills | Status: AC | PRN
Start: 2021-05-25 — End: ?
  Administered 2021-05-25: 18:00:00 0.4 mg via INTRAVENOUS

## 2021-05-25 NOTE — Progress Notes
CHEMO NOTE  Verified chemo consent signed and in chart.    Blood return positive via: Port (Single and Power Port)    BSA and dose double checked (agree with orders as written) with: yes See MAR    Labs/applicable tests checked: CBC and Comprehensive Metabolic Panel (CMP)    Chemo regimen: C10D1    bevacizumab-awwb (MVASI) 260.5 mg in sodium chloride 0.9% (NS) 110.42 mL IVPB     irinotecan (CAMPTOSAR) 196.4 mg in dextrose 5% (D5W) 509.82 mL IVPB    fluorouraciL (ADRUCIL) 3,624 mg in sodium chloride 0.9% (NS) 92 mL IV amb pump  Rate verified and armband double check with second RN: yes    Patient education offered and stated understanding. Denies questions at this time.    Patient arrived to [place] treatment after being seen in clinic; please refer to clinic note for assessment details. Premeds given per treatment plan. Mvasi and irinotecan given w/o complications, patient tolerated well. Port positive for blood return, flushed with saline, and currently infusing 5FU via ambulatory pump. All connections secure, unclamped, and face of pump reads infusing. Patient declined copy of labs and AVS. All questions and concerns addressed. Patient left CC treatment in stable condition.

## 2021-06-02 ENCOUNTER — Encounter: Admit: 2021-06-02 | Discharge: 2021-06-02 | Payer: MEDICARE

## 2021-06-07 NOTE — Progress Notes
Date of Service: 06/08/2021    SUBJECTIVE:             Reason for Visit:  Follow Up    Vicki Buchanan is a 66 y.o. 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  02/19/17 diagnostic laparoscopy with biopsies of the right diaphragm, right liver, right fallopian tube and left pelvis. 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  Planned for exploratory laparotomy with CRS/HIPEC in combination with a TAH/BSO in 03/2017.  Developed intestinal obstruction and taken to OR for CRS/HIPEC surgery; however multiple adhesions, omental cracking. CRS/HIPEC was aborted.  Immunotherapy on epacadostat?+ sirolimus?trial 12/24/17-05/21/18.  She stopped study drug on 05/21/18 for mitomycin HIPEC trial at [facility] [place].  On 05/28/18 diagnostic laparoscopy, evacuation of mucinous ascites 1.5L, peritoneal biopsy, laparoscopic HIPEC with mitomycin-C in [place] by Dr. Daryl Eastern.  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  Completed second laparoscopic HIPEC on 06/25/18.  Again 1.5L removed.  09/12/18 underwent debulking at [facility] in [place], [place].  Removed 98% by Dr. [name].  Discharged on 09/30/18.  She had L pleural effusion requiring chest tube drainage; intra-abd abscess 10/2018; discharged on 11/04/18 with drain and IV cefepime and Diflucan. Her ureteral stents were removed and ostomy relocated in 10/2018.  Hospitalized in 01/2019 for AKI 2/2 dehydration and likely PE (V/Q scan and trop elevation). CEA increasing again. Repeat CT CAP 03/20/19 with stable to slight increase in cancer and RUL nodule.  Discussed observation vs treatment. She chose observation and is considering repeat CRS + HIPEC via Dr. [name] in [place].  CT scans from 09/24/19 of the abdomen, without contrast, appear stable.  The peritoneal thickening, carcinomatosis, retroperitoneal lymph nodes, a small amount of fluid collection or trace ascites, appear to be stable compared to 3 months ago. 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  CT scan on 08/17/20 showed a right lung mass that is slightly increased from 11 mm to 14 mm. There are some new lung nodules that have developed, a small one in the left lung, a new nodule in the right lung. Inside the abdomen we are seeing continued increase in size of the peritoneal nodules. The largest measuring 14 mm compared to 12 mm.  The nodule lower in the pelvis increased from 16 mm to 21 mm.  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  TMB = 1.3.  S/p CRS w/ ostomy revision and tumor removal around stoma on 10/14/20.  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 [name] PA from [facility] in [place] called to relay their TB recommendations for patient.  Recommendations to proceed with FOLFIRI/Avastin with holding Avastin 6-8 weeks post-op. They also request if our office can repeat genomic testing.  Repeat TEMPUS from 10/14/20.  Pre-screen for KRAS G12D inhibitor trial, if not eligible will plan for FOLFIRI +/- bevacizumab.       Interval Hx 06/08/21:  Pt presents for next treatment. She continues to tolerate well with a little more fatigue. This is still tolerable and she is active. She continues to get IVF 2 times a week close to home and this continues to help her feel better. She is getting better at controlling her diarrhea with imodium and lomotil. She wonders if she can start to cut down on the fluid she gets. Her appetite and weight  are stable. She denies N/V or pain.        Review of Systems   Constitutional: Positive for fatigue. Negative for activity change, appetite change, chills, diaphoresis, fever and unexpected weight change.   HENT: Negative for facial swelling, mouth sores, sore throat, trouble swallowing and voice change.    Eyes: Negative.  Negative for visual disturbance. Respiratory: Negative for cough, chest tightness, shortness of breath and wheezing.    Cardiovascular: Negative.  Negative for chest pain, palpitations and leg swelling.   Gastrointestinal: Positive for diarrhea. Negative for abdominal distention, abdominal pain, anal bleeding, blood in stool, constipation, nausea, rectal pain and vomiting.   Genitourinary: Negative for difficulty urinating, dysuria and hematuria.   Musculoskeletal: Negative.  Negative for arthralgias, back pain, neck pain and neck stiffness.   Skin: Negative for color change, pallor, rash and wound (ulceration near stoma healed).   Neurological: Negative for dizziness, weakness, light-headedness, numbness and headaches.   Hematological: Does not bruise/bleed easily.   Psychiatric/Behavioral: Negative for agitation. The patient is not nervous/anxious.        Medical History:   Diagnosis Date   ? Bundle branch block    ? Cancer [facility])    ? Cancer of appendix [facility])      Surgical History:   Procedure Laterality Date   ? CESAREAN SECTION  1981   ? HX CHOLECYSTECTOMY  1995   ? LAPAROSCOPY  02/2017    biopsy peritoneal   ? COLONOSCOPY DIAGNOSTIC WITH SPECIMEN COLLECTION BY BRUSHING/ WASHING - FLEXIBLE N/A 03/19/2017    Performed by Everardo All, MD at [facility] ENDO   ? COLONOSCOPY WITH BIOPSY - FLEXIBLE  03/19/2017    Performed by Everardo All, MD at [facility] ENDO   ? EXPLORATORY LAPAROTOMY, DIVERTING LOOP ILEOSTOMY N/A 04/03/2017    Performed by Loralie Champagne, MD at [facility] OR   ? INSERTION TUNNELED CENTRAL VENOUS CATHETER - AGE 7 YEARS AND OVER Left 04/23/2017    Performed by Loralie Champagne, MD at [facility] OR   ? CYSTOURETHROSCOPY WITH INDWELLING URETERAL STENT INSERTION Bilateral 04/09/2018    Performed by Earl Many, MD at [facility] OR   ? CYSTOURETHROSCOPY WITH URETERAL CATHETERIZATION WITH/ WITHOUT IRRIGATION/ INSTILLATION/ URETEROPYELOGRAPHY Bilateral 04/09/2018    Performed by Earl Many, MD at [facility] OR   ? CYSTOURETHROSCOPY WITH INDWELLING URETERAL STENT EXCHANGE (RIGHT 6 x 26cm / LEFT 6 x 28cm) Bilateral 08/08/2018    Performed by Earl Many, MD at [facility] OR   ? RETROGRADE UROGRAPHY WITH/ WITHOUT KUB Bilateral 08/08/2018    Performed by Earl Many, MD at [facility] OR   ? ILEOSTOMY OR JEJUNOSTOMY Right    ? TUNNELED VENOUS PORT PLACEMENT Left    ? URETER STENT PLACEMENT Bilateral      Family History   Problem Relation Age of Onset   ? Diabetes Mother    ? Cancer Father    ? Diabetes Brother    ? Cancer-Lung Paternal Aunt    ? Cancer-Breast Maternal Grandmother    ? Diabetes Paternal Grandmother      Social History     Socioeconomic History   ? Marital status: Married   Tobacco Use   ? Smoking status: Never   ? Smokeless tobacco: Never   Vaping Use   ? Vaping Use: Never used   Substance and Sexual Activity   ? Alcohol use: No     Comment: rarely   ? Drug use: No         OBJECTIVE:         ?  cefdinir (OMNICEF) 300 mg capsule Take one capsule by mouth. FOR 10 DAYS   ? Cetirizine (ZYRTEC) 10 mg cap Take one capsule by mouth as Needed.   ? dexAMETHasone (DECADRON) 4 mg tablet 1 tab by mouth with breakfast and afternoon snack on days 2 & 3 following each chemotherapy cycle. Do not take after 6 pm to avoid insomnia.   ? diphenoxylate-atropine (LOMOTIL) 2.5-0.025 mg tablet Take one tablet by mouth four times daily as needed for Diarrhea.   ? esomeprazole DR (NEXIUM) 40 mg capsule Take one capsule by mouth daily. Take on an empty stomach at least 1 hour before or 2 hours after food.   ? famotidine (PEPCID AC) 10 mg tablet Take one tablet by mouth twice daily.   ? loperamide (IMODIUM A-D) 2 mg capsule Take one pill by mouth four times daily as needed   ? LORazepam (ATIVAN) 0.5 mg tablet Take 1-2 tabs by mouth every 6 hrs as needed N/V not controlled by Zofran or Compazine. May also use every 6 hrs as needed anxiety or at bedtime insomnia.   ? magnesium citrate 200 mg tab Take one tablet by mouth daily.   ? MULTIVITAMIN PO Take 1 tablet by mouth daily.   ? ondansetron (ZOFRAN ODT) 8 mg rapid dissolve tablet Dissolve one tablet by mouth every 8 hours as needed for Nausea or Vomiting. Place on tongue to disolve.   ? opium tincture oral solution Take 0.6 mL by mouth every 6 hours.   ? potassium chloride (KLOR-CON SPRINKLE) 10 mEq capsule Take one capsule by mouth twice daily. If potassium < 3.5. Take with a meal and a full glass of water.   ? prochlorperazine maleate (COMPAZINE) 10 mg tablet 1 tablet by mouth every 6 hours as needed for nausea not controlled by Zofran.   ? psyllium husk (METAMUCIL) 0.4 gram cap    ? sodium chloride 0.9% (NS) 0.9 % 1,000 mL with potassium chloride 2 mEq/mL 20 mEq, magnesium sulfate 4 mEq/mL (50 %) 2 g IV infusion Administer 1L NS with K and 2g Mg once weekly via outpatient infusion.     Vitals:    06/08/21 0818   BP: 117/81   BP Source: Arm, Left Upper   Pulse: 84   Temp: (!) 35.9 ?C (96.7 ?F)   Resp: 17   SpO2: 100%   TempSrc: Temporal   PainSc: Zero   Weight: 50.2 kg (110 lb 9.6 oz)     Body mass index is 20.61 kg/m?Marland Kitchen     Pain Score: Zero       Pain Addressed:  N/A    Patient Evaluated for a Clinical Trial: Patient not eligible for a treatment trial (including not needing treatment, needs palliative care, in remission).     [place] Cooperative Oncology Group performance status is 1, Restricted in physically strenuous activity but ambulatory and able to carry out work of a light or sedentary nature, e.g., light house work, office work.       Physical Exam  Vitals reviewed.   Constitutional:       General: She is not in acute distress.     Appearance: Normal appearance. She is well-developed. She is not ill-appearing, toxic-appearing or diaphoretic.   HENT:      Head: Normocephalic and atraumatic.      Nose: Nose normal. No rhinorrhea.      Mouth/Throat:      Mouth: Mucous membranes are moist. Mucous membranes are not pale. No oral lesions.  Pharynx: Oropharynx is clear. No oropharyngeal exudate or posterior oropharyngeal erythema. Tonsils: No tonsillar abscesses.   Eyes:      General: No scleral icterus.        Right eye: No discharge.         Left eye: No discharge.      Extraocular Movements: Extraocular movements intact.      Conjunctiva/sclera: Conjunctivae normal.      Pupils: Pupils are equal, round, and reactive to light.   Cardiovascular:      Rate and Rhythm: Normal rate and regular rhythm.      Pulses: Normal pulses.      Heart sounds: Normal heart sounds. No murmur heard.    No gallop.   Pulmonary:      Effort: Pulmonary effort is normal. No respiratory distress.      Breath sounds: Normal breath sounds. No stridor. No wheezing, rhonchi or rales.   Chest:      Chest wall: No tenderness.      Comments: Port site WNL  Abdominal:      General: Bowel sounds are normal. There is no distension.      Palpations: Abdomen is soft. There is no mass.      Tenderness: There is no abdominal tenderness. There is no guarding or rebound.      Hernia: No hernia is present.       Musculoskeletal:         General: No swelling, tenderness, deformity or signs of injury. Normal range of motion.      Cervical back: Normal range of motion and neck supple. No rigidity. No muscular tenderness.      Right lower leg: No edema.      Left lower leg: No edema.   Lymphadenopathy:      Cervical: No cervical adenopathy.   Skin:     General: Skin is warm and dry.      Coloration: Skin is not jaundiced or pale.      Findings: No bruising, erythema, lesion or rash.   Neurological:      General: No focal deficit present.      Mental Status: She is alert and oriented to person, place, and time. Mental status is at baseline.      Cranial Nerves: No cranial nerve deficit.      Motor: No weakness.      Coordination: Coordination normal.      Gait: Gait normal.   Psychiatric:         Mood and Affect: Mood normal.         Behavior: Behavior normal.         Thought Content: Thought content normal.         Judgment: Judgment normal.            CBC w/DIFF  CBC with Diff Latest Ref Rng & Units 06/08/2021 05/25/2021 05/11/2021 04/27/2021 03/30/2021   WBC 4.5 - 11.0 K/UL 4.3(L) 5.1 6.2 10.5 10.5   RBC 4.0 - 5.0 M/UL 2.96(L) 2.99(L) 2.88(L) 2.77(L) 3.02(L)   HGB 12.0 - 15.0 GM/DL 10.3(L) 10.0(L) 9.9(L) 9.5(L) 10.2(L)   HCT 36 - 45 % 30.2(L) 30.5(L) 29.6(L) 28.6(L) 30.7(L)   MCV 80 - 100 FL 102.1(H) 101.8(H) 102.9(H) 103.5(H) 101.7(H)   MCH 26 - 34 PG 34.8(H) 33.3 34.4(H) 34.3(H) 33.7   MCHC 32.0 - 36.0 G/DL 16.1 09.6 04.5 40.9 81.1   RDW 11 - 15 % 16.3(H) 15.3(H) 15.0 15.1(H) 14.6   PLT 150 - 400  K/UL 445(H) 404(H) 408(H) 610(H) 613(H)   MPV 7 - 11 FL 7.5 7.7 7.5 7.1 7.4   NEUT 41 - 77 % 30(L) 39(L) 47 62 64   ANC 1.8 - 7.0 K/UL 1.30(L) 2.00 2.90 6.50 6.70   LYMA 24 - 44 % 38 33 27 19(L) 21(L)   ALYM 1.0 - 4.8 K/UL 1.70 1.70 1.70 2.00 2.20   MONA 4 - 12 % 28(H) 23(H) 19(H) 16(H) 11   AMONO 0 - 0.80 K/UL 1.20(H) 1.20(H) 1.20(H) 1.70(H) 1.20(H)   EOSA 0 - 5 % 3 4 6(H) 2 3   AEOS 0 - 0.45 K/UL 0.10 0.20 0.40 0.30 0.30   BASA 0 - 2 % 1 1 1 1 1    ABAS 0 - 0.20 K/UL 0.00 0.00 0.00 0.10 0.20     Comprehensive Metabolic Profile  CMP Latest Ref Rng & Units 06/08/2021 05/25/2021 05/11/2021 04/27/2021 03/30/2021   NA 137 - 147 MMOL/L 138 139 139 139 138   K 3.5 - 5.1 MMOL/L 4.4 3.4(L) 4.1 4.5 4.3   CL 98 - 110 MMOL/L 102 100 102 106 98   CO2 21 - 30 MMOL/L 28 32(H) 28 26 30    GAP 3 - 12 8 7 9 7 10    BUN 7 - 25 MG/DL 25 45(W) 09(W) 21 11(B)   CR 0.4 - 1.00 MG/DL 1.47(W) 2.95(A) 2.13(Y) 1.71(H) 2.19(H)   GLUX 70 - 100 MG/DL 95 865(H) 87 86 91   CA 8.5 - 10.6 MG/DL 9.5 9.2 9.1 9.3 9.8   TP 6.0 - 8.0 G/DL 7.3 6.9 7.5 7.4 8.4(O)   ALB 3.5 - 5.0 G/DL 3.9 3.6 3.9 3.7 3.7   ALKP 25 - 110 U/L 302(H) 299(H) 387(H) 411(H) 364(H)   ALT 7 - 56 U/L 28 43 39 35 33   TBILI 0.3 - 1.2 MG/DL 0.5 0.5 0.5 0.6 0.4   GFR >60 mL/min - - - - -   GFRAA >60 mL/min - - - - -     Tumor Markers  Lab Results   Component Value Date    CEA 520.8 (H) 05/25/2021    CEA 629.6 (H) 05/11/2021    CEA 388.5 (H) 04/27/2021    CEA 983.2 (H) 03/30/2021    CEA 1,179.5 (H) 03/16/2021    CEA 1,276.6 (H) 03/02/2021    CEA 1,616.3 (H) 02/16/2021    CEA 1,825.6 (H) 01/26/2021    CEA 1,504.7 (H) 01/12/2021    CEA 1,815.4 (H) 12/29/2020    CA199 <1 05/25/2021    CA199 <1 05/11/2021    CA199 <1 04/27/2021    CA199 <1 03/30/2021    CA199 <1 03/16/2021    CA199 <1 03/02/2021    CA199 <1 02/16/2021    CA199 <1 01/26/2021    CA199 <1 01/12/2021    CA199 <1 12/29/2020    CA125 41 (H) 01/21/2018    CA125 42 (H) 11/21/2017    CA125 46 (H) 11/05/2017    CA125 41 (H) 10/22/2017    CA125 34 10/08/2017    CA125 34 09/24/2017    CA125 31 09/10/2017    CA125 29 08/27/2017    CA125 34 08/15/2017    CA125 39 (H) 07/30/2017           Imaging:  - 04/27/21 CT CAP without contrast  CHEST:   1. ?No significant change in small indeterminate anterior epiphrenic lymph nodes. No new thoracic lymphadenopathy.   2. No significant change to further mild increase  in size of pulmonary and pleural metastases.   3. Increasing nodular and tree-in-bud opacities in the lingula and development of mild focal consolidation in the left lower lobe, indeterminate and may be infectious/inflammatory nodules or potentially pulmonary metastases. ?     ABDOMEN AND PELVIS:   1. ?Grossly unchanged extensive peritoneal carcinomatosis.   2. No significant change in small residual indeterminate retroperitoneal lymph nodes. No discrete new abdominopelvic lymphadenopathy.   3. Grossly unchanged right anterior abdominal wall metastases.     CT CHEST WO CONTRAST  Narrative: CT CHEST, ABDOMEN AND PELVIS    Clinical Indication: Female, 66 years old. Cancer of appendix. Peritoneal carcinomatosis.    Technique: Multiple contiguous axial images were obtained through the chest, abdomen and pelvis without IV contrast material. Post processing coronal and sagittal reconstruction images were made from the axial images.      IV contrast: None  Bowel contrast:  None    Comparison: Noncontrast CT chest, abdomen, and pelvis on 02/16/2021.    CHEST FINDINGS:    Evaluation of the mediastinum and hila, including the vasculature and for lymphadenopathy, is limited without the use of IV contrast.    Lower Neck: Unremarkable.    Axilla, Mediastinum and Hila: No significant change in clustered indeterminate epiphrenic lymph nodes (series 2 image 82). No new thoracic lymphadenopathy.     Heart and Great Vessels: Left subclavian chest port terminates in the SVC. Heart size normal. At least mild coronary artery calcifications. Thoracic aorta normal in caliber. Mild calcified atherosclerotic plaque.    Airway, Lungs and Pleura: Several areas of peripheral mucous plugging in the lingula. Minimal scarring and/or atelectasis. Development of mild focal consolidation in the dependent left lower lobe (series 2 image 55). Increase in focal tree-in-bud and nodular opacities in the lingula with adjacent areas of mucus plugging (for example series 2 images 65 through 71). No pleural effusion. No significant change to further mild increase in size of pulmonary and pleural metastases (for example series 2 images 22 and 29). Previously measured metastases as follows:    Right upper lobe, 2.2 cm (series 2 image 13) previously 1.5 cm.  Right lower lobe, 1 cm (series 2 image 35) previously 1 cm.    Chest Wall and Osseous Structures: Lower cervical and mild thoracic spondylosis. No destructive osseous lesion.     Abdomen and Pelvis Findings:    Limited evaluation without the use of IV contrast which includes the viscera and vasculature.    Liver and Biliary system: Liver is normal in size. Persistent scalloping of the hepatic margins from peritoneal metastases. Unchanged right hepatic cyst. No obvious new parenchymal hepatic mass on noncontrast CT. Persistent intrahepatic biliary ductal dilatation, greatest in the left lobe, likely from periportal peritoneal metastases.     Spleen: Splenectomy.    Adrenal Glands and Kidneys: Adrenal glands unremarkable. Several subcentimeter renal calculi. No hydronephrosis.    Pancreas and Retroperitoneum: Unopacified pancreas is partially obscured due to extensive peritoneal metastases. No significant change in small indeterminate retroperitoneal lymph nodes. No new lymphadenopathy.    Aorta and Major Vessels: Normal caliber abdominal aorta. Mild calcified atherosclerotic plaque.     Bowel, Mesentery and Peritoneal space: Distal colonic anastomosis. Omentectomy. Right lower quadrant ostomy. No bowel obstruction. Overall no significant change in extensive peritoneal metastases, many of which are partially calcified.    Pelvis: Hysterectomy. Persistent gas and fluid distention of the vaginal cuff. Urinary bladder is incompletely distended and not well evaluated. No discrete new pelvic lymphadenopathy.  Abdominal wall and Osseous Structures: Midline ventral abdominal incision scar. Grossly unchanged right anterior abdominal wall metastases. Previously measured subcutaneous right ventral abdominal wall metastasis measures 1.9 x 1.6 cm (series 2 image 141) disease previously 1.9 x 1.6 cm. No destructive osseous lesion.  Impression: CHEST:  1.  No significant change in small indeterminate anterior epiphrenic lymph nodes. No new thoracic lymphadenopathy.    2. No significant change to further mild increase in size of pulmonary and pleural metastases.    3. Increasing nodular and tree-in-bud opacities in the lingula and development of mild focal consolidation in the left lower lobe, indeterminate and may be infectious/inflammatory nodules or potentially pulmonary metastases.      ABDOMEN AND PELVIS:  1.  Grossly unchanged extensive peritoneal carcinomatosis.    2. No significant change in small residual indeterminate retroperitoneal lymph nodes. No discrete new abdominopelvic lymphadenopathy.    3. Grossly unchanged right anterior abdominal wall metastases.     Finalized by Ancil Boozer, M.D. on 04/27/2021 8:58 AM. Dictated by Ancil Boozer, M.D. on 04/27/2021 8:28 AM.  CT ABD/PELV WO CONTRAST  Narrative: CT CHEST, ABDOMEN AND PELVIS    Clinical Indication: Female, 66 years old. Cancer of appendix. Peritoneal carcinomatosis.    Technique: Multiple contiguous axial images were obtained through the chest, abdomen and pelvis without IV contrast material. Post processing coronal and sagittal reconstruction images were made from the axial images.      IV contrast: None  Bowel contrast:  None    Comparison: Noncontrast CT chest, abdomen, and pelvis on 02/16/2021.    CHEST FINDINGS:    Evaluation of the mediastinum and hila, including the vasculature and for lymphadenopathy, is limited without the use of IV contrast.    Lower Neck: Unremarkable.    Axilla, Mediastinum and Hila: No significant change in clustered indeterminate epiphrenic lymph nodes (series 2 image 82). No new thoracic lymphadenopathy.     Heart and Great Vessels: Left subclavian chest port terminates in the SVC. Heart size normal. At least mild coronary artery calcifications. Thoracic aorta normal in caliber. Mild calcified atherosclerotic plaque.    Airway, Lungs and Pleura: Several areas of peripheral mucous plugging in the lingula. Minimal scarring and/or atelectasis. Development of mild focal consolidation in the dependent left lower lobe (series 2 image 55). Increase in focal tree-in-bud and nodular opacities in the lingula with adjacent areas of mucus plugging (for example series 2 images 65 through 71). No pleural effusion. No significant change to further mild increase in size of pulmonary and pleural metastases (for example series 2 images 22 and 29). Previously measured metastases as follows:    Right upper lobe, 2.2 cm (series 2 image 13) previously 1.5 cm.  Right lower lobe, 1 cm (series 2 image 35) previously 1 cm.    Chest Wall and Osseous Structures: Lower cervical and mild thoracic spondylosis. No destructive osseous lesion.     Abdomen and Pelvis Findings:    Limited evaluation without the use of IV contrast which includes the viscera and vasculature.    Liver and Biliary system: Liver is normal in size. Persistent scalloping of the hepatic margins from peritoneal metastases. Unchanged right hepatic cyst. No obvious new parenchymal hepatic mass on noncontrast CT. Persistent intrahepatic biliary ductal dilatation, greatest in the left lobe, likely from periportal peritoneal metastases.     Spleen: Splenectomy.    Adrenal Glands and Kidneys: Adrenal glands unremarkable. Several subcentimeter renal calculi. No hydronephrosis.    Pancreas and Retroperitoneum: Unopacified pancreas is partially  obscured due to extensive peritoneal metastases. No significant change in small indeterminate retroperitoneal lymph nodes. No new lymphadenopathy.    Aorta and Major Vessels: Normal caliber abdominal aorta. Mild calcified atherosclerotic plaque.     Bowel, Mesentery and Peritoneal space: Distal colonic anastomosis. Omentectomy. Right lower quadrant ostomy. No bowel obstruction. Overall no significant change in extensive peritoneal metastases, many of which are partially calcified.    Pelvis: Hysterectomy. Persistent gas and fluid distention of the vaginal cuff. Urinary bladder is incompletely distended and not well evaluated. No discrete new pelvic lymphadenopathy.    Abdominal wall and Osseous Structures: Midline ventral abdominal incision scar. Grossly unchanged right anterior abdominal wall metastases. Previously measured subcutaneous right ventral abdominal wall metastasis measures 1.9 x 1.6 cm (series 2 image 141) disease previously 1.9 x 1.6 cm. No destructive osseous lesion.  Impression: CHEST:  1.  No significant change in small indeterminate anterior epiphrenic lymph nodes. No new thoracic lymphadenopathy.    2. No significant change to further mild increase in size of pulmonary and pleural metastases.    3. Increasing nodular and tree-in-bud opacities in the lingula and development of mild focal consolidation in the left lower lobe, indeterminate and may be infectious/inflammatory nodules or potentially pulmonary metastases.      ABDOMEN AND PELVIS:  1.  Grossly unchanged extensive peritoneal carcinomatosis.    2. No significant change in small residual indeterminate retroperitoneal lymph nodes. No discrete new abdominopelvic lymphadenopathy.    3. Grossly unchanged right anterior abdominal wall metastases.     Finalized by Ancil Boozer, M.D. on 04/27/2021 8:58 AM. Dictated by Ancil Boozer, M.D. on 04/27/2021 8:28 AM.        ASSESSMENT AND PLAN:    1. Metastatic appendiceal carcinoma. Signet ring adenocarcinoma, MSS, with peritoneal carcinomatosis with  pseudomyxoma peritonei.  02/19/17 diagnostic laparoscopy with biopsies of the right diaphragm, right liver, right fallopian tube and left pelvis. 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  Planned for exploratory laparotomy with CRS/HIPEC in combination with a TAH/BSO in 03/2017.  Developed intestinal obstruction and taken to OR for CRS/HIPEC surgery; however multiple adhesions, omental cracking. CRS/HIPEC was aborted.  Immunotherapy on epacadostat?+ sirolimus?trial 12/24/17-05/21/18.  She stopped study drug on 05/21/18 for mitomycin HIPEC trial at [facility] [place].  On 05/28/18 diagnostic laparoscopy, evacuation of mucinous ascites 1.5L, peritoneal biopsy, laparoscopic HIPEC with mitomycin-C in [place] by Dr. Daryl Eastern.  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  Completed second laparoscopic HIPEC on 06/25/18.  Again 1.5L removed.  09/12/18 underwent debulking at [facility] in [place], [place].  Removed 98% by Dr. [name].  Discharged on 09/30/18.  She had L pleural effusion requiring chest tube drainage; intra-abd abscess 10/2018; discharged on 11/04/18 with drain and IV cefepime and Diflucan. Her ureteral stents were removed and ostomy relocated in 10/2018.  Hospitalized in 01/2019 for AKI 2/2 dehydration and likely PE (V/Q scan and trop elevation). CEA increasing again. Repeat CT CAP 03/20/19 with stable to slight increase in cancer and RUL nodule.  Discussed observation vs treatment. She chose observation and is considering repeat CRS + HIPEC via Dr. [name] in [place].  CT scans from 09/24/19 of the abdomen, without contrast, appear stable.  The peritoneal thickening, carcinomatosis, retroperitoneal lymph nodes, a small amount of fluid collection or trace ascites, appear to be stable compared to 3 months ago. 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  CT scan on 08/17/20 showed a right lung mass that is slightly increased from 11 mm to  14 mm. There are some new lung nodules that have developed, a small one in the left lung, a new nodule in the right lung. Inside the abdomen we are seeing continued increase in size of the peritoneal nodules. The largest measuring 14 mm compared to 12 mm.  The nodule lower in the pelvis increased from 16 mm to 21 mm.  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  TMB = 1.3.  S/p CRS w/ ostomy revision and tumor removal around stoma on 10/14/20.  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 [name] PA from [facility] in [place] called to relay their TB recommendations for patient.  Recommendations to proceed with FOLFIRI/Avastin with holding Avastin 6-8 weeks post-op. They also request if our office can repeat genomic testing.  Repeat TEMPUS from 10/14/20.  Pre-screen for KRAS G12D inhibitor trial, if not eligible will plan for FOLFIRI +/- bevacizumab. Tolerated C1 FOLFIRI but more diarrhea and N/V after C2. Decreased irinotecan to 130 mg/m2 due to diarrhea. S/p C4, with bevacizumab added on. CT scan 02/16/21 abdominal wall spots are under control and stable. There is a slight increase in size of the lung nodules. The radiologist is measuring 10 mm to 8 mm in 11/2020. The other right lung nodule is more or less the same at 15 mm. CT scans with stable disease, mild increase in size of pulmonary metastases (specifically the right upper lobe nodule 2.2cm, previously 1.5cm). There is also some increasing nodular and tree-in-bud opacities in the lingula and development of mild focal consolidation in the left lower lobe, indeterminate but may be related to recent COVID infection vs pulmonary metastases. Given overall stability and down-trending CEA, we will plan to continue with the current treatment regimen for now.    Plan 06/08/21:  Tolerating treatment well. Labs stable.  A little more fatigue but tolerable. Planning for some trips this summer,   Proceed with C11 FOLFIRI + bevacizumab today (irinotecan 130 mg/m2, 5-FU 2400 mg/m2, avastin) with IVF.  ANC 1300. Consider gcsf or dose reduction of 5FU next cycle.   Continue to get IV fluids and pump disconnect in [place].  Dr. Dorita Fray with Radiation Oncology reviewed imaging (already received 50Gy to right lung 3/1-05/25/20) and will continue to monitor.   Return to clinic in 2 weeks with labs prior to consideration of C11 FOLFIRI + bevacizumab.  Plan for repeat imaging 3 months from last scan, 5/18  Continue supportive measures.    2. Elevated creatinine, s/p ureteral stent. Her ureteral stents were removed 10/2018. Encouraged hydration. S/p IVF 1L NS and 1g Mg weekly x 4 weeks. Referred to Nephrology as Cr is improving with IVF. She is following with Dr. Marjie Skiff in Nephrology. Cr 2.48 after GI virus. Decreased irinotecan to 130 mg/m2. Continue IV fluids 3 times a week locally, as we anticipate more diarrhea with irinotecan. Creatinine was greater than 4 with infection. Cr 1.92 today. Proceed with treatment as scheduled with IV fluid support.    3. Diarrhea. Better with Lomotil daily. Continue IV fluids 3 times a week locally. Increased her Lomotil to 2 tablets 4 times daily. She will continue to take Metamucil to bulk up her stools. She had a day of decreased stool output but was able to flush the Metamucil out with an enema. Bowels are back to baseline now. Continue to take Imodium as needed, okay to increase to 2 tabs at a  time as needed. Better with advanced diet. Obstruction with too much fiber. Managed at home.     4. Nausea. From chemo. Worse after possible GI virus. Tried Compazine. Resolved now that infections have cleared. Chemo as above, continue antiemetics, call if not controlled. Less with last chemo.     5. Fatigue. Improved now that infections are better. More with on-going chemo.     6. Hypomagnesemia. Due to large volume fluid loss. Check level today and replace if needed. Not needed today.     Turner Daniels, APRN-NP        The patient and family were allowed to ask questions and voice concerns; these were addressed to the best of our ability. They expressed understanding of what was explained to them, and they agreed with the present plan. RTC in 2 weeks with labs to see a provider. Patient has the phone numbers for the Cancer Center and was instructed on how to contact us with any questions or concerns. My collaborating physician on this case is Dr. Josiah Lobo.

## 2021-06-08 ENCOUNTER — Encounter: Admit: 2021-06-08 | Discharge: 2021-06-08 | Payer: MEDICARE

## 2021-06-08 DIAGNOSIS — C786 Secondary malignant neoplasm of retroperitoneum and peritoneum: Secondary | ICD-10-CM

## 2021-06-08 MED ORDER — PALONOSETRON 0.25 MG/5 ML IV SOLN
.25 mg | Freq: Once | INTRAVENOUS | 0 refills | Status: CP
Start: 2021-06-08 — End: ?
  Administered 2021-06-08: 16:00:00 0.25 mg via INTRAVENOUS

## 2021-06-08 MED ORDER — PALONOSETRON 0.25 MG/5 ML IV SOLN
.25 mg | Freq: Once | INTRAVENOUS | 0 refills
Start: 2021-06-08 — End: ?

## 2021-06-08 MED ORDER — FLUOROURACIL IV AMB PUMP
2400 mg/m2 | Freq: Once | INTRAVENOUS | 0 refills
Start: 2021-06-08 — End: ?

## 2021-06-08 MED ORDER — DEXAMETHASONE 6 MG PO TAB
12 mg | Freq: Once | ORAL | 0 refills | Status: CP
Start: 2021-06-08 — End: ?
  Administered 2021-06-08: 16:00:00 12 mg via ORAL

## 2021-06-08 MED ORDER — ATROPINE 0.4 MG/ML IJ SOLN
.4 mg | INTRAVENOUS | 0 refills | Status: AC | PRN
Start: 2021-06-08 — End: ?
  Administered 2021-06-08: 17:00:00 0.4 mg via INTRAVENOUS

## 2021-06-08 MED ORDER — FLUOROURACIL IV AMB PUMP
2400 mg/m2 | Freq: Once | INTRAVENOUS | 0 refills | Status: CP
Start: 2021-06-08 — End: ?
  Administered 2021-06-08 (×3): 3624 mg via INTRAVENOUS

## 2021-06-08 MED ORDER — IRINOTECAN IVPB
130 mg/m2 | Freq: Once | INTRAVENOUS | 0 refills | Status: CP
Start: 2021-06-08 — End: ?
  Administered 2021-06-08 (×2): 196.4 mg via INTRAVENOUS

## 2021-06-08 MED ORDER — ATROPINE 0.4 MG/ML IJ SOLN
0.4 mg | INTRAVENOUS | 0 refills | PRN
Start: 2021-06-08 — End: ?

## 2021-06-08 MED ORDER — DEXAMETHASONE 6 MG PO TAB
12 mg | Freq: Once | ORAL | 0 refills
Start: 2021-06-08 — End: ?

## 2021-06-08 MED ORDER — BEVACIZUMAB-AWWB IVPB
5 mg/kg | Freq: Once | INTRAVENOUS | 0 refills | Status: CP
Start: 2021-06-08 — End: ?
  Administered 2021-06-08 (×2): 260.5 mg via INTRAVENOUS

## 2021-06-08 MED ORDER — SODIUM CHLORIDE 0.9 % IV SOLP
1000 mL | Freq: Once | INTRAVENOUS | 0 refills | Status: CP
Start: 2021-06-08 — End: ?
  Administered 2021-06-08: 16:00:00 1000 mL via INTRAVENOUS

## 2021-06-08 NOTE — Telephone Encounter
Called to schedule CT. Pt prefers Tuesday and Thursday in the morning

## 2021-06-08 NOTE — Progress Notes
Patient presented to clinic for treatment. Patient's port accessed with positive blood return. Patient's labs okay to treat. Patient tolerated infusion well. Patient's port flushed with saline via turbulent flush and Patient's port connected to ambulatory pump. Patient discharged off unit in stable condition.       CHEMO NOTE  Verified chemo consent signed and in chart.    Blood return positive via: Port (Accessed)    BSA and dose double checked (agree with orders as written) with: yes     Labs/applicable tests checked: CBC and Comprehensive Metabolic Panel (CMP)    Chemo regimen: D1C11 MVASI/FOLFIRI/IVF    bevacizumab-awwb (MVASI) 260.5 mg in sodium chloride 0.9% (NS) 110.42 mL IVPB    irinotecan (CAMPTOSAR) 196.4 mg in dextrose 5% (D5W) 509.82 mL IVPB    fluorouraciL (ADRUCIL) 3,624 mg in sodium chloride 0.9% (NS) 92 mL IV amb pump     Rate verified and armband double check with second RN: yes    Patient education offered and stated understanding. Denies questions at this time.

## 2021-06-08 NOTE — Patient Instructions
Dr. Vanita Ingles MD Medical Oncologist specializing in Gastro-Intestinal malignancies   Lianne Moris APRN    Nurse: Rolla Etienne RN BSN. Phone: [phone] Fax: [phone], available Monday-Friday 8:00am- 4:00pm. After your new patient visit, we will be listening into the clinic appointments via Teams on the iPad. This is the best way to make sure we hear your concerns, the provider's recommendations and do the behind the scenes work to give you the best care.      Medication refills: please contact your pharmacy for medication refills, if they do not have any refills on file they will contact the office, make sure they have our correct contact information on file P: [phone], F: [phone]. Please allow 24 to 48 hours for medication refill requests.     MyChart Messages: All mychart messages are answered by the nurses, even if you select to send the message to Dr. Josiah Lobo or Denny Peon.  We often communicate with Dr. Josiah Lobo and Denny Peon on how to answer these messages, but all messages will come from the nurses.  Messages are answered 8:00am-4:00pm Monday-Friday, holidays excluded.    Phone Calls: We are hardly ever sitting at our desks where our phone is located as we are in clinic most days during the week. Please leave [place] a message as we check our messages several times per day.  It is our goal to answer these messages as soon as possible.  Messages are answered from 8:00am-4:00 pm Monday-Friday, holidays excluded.  Please make sure you leave your full name with the spelling, date of birth, reason for your call, and return number when leaving a message.     *For each time you contact us for assistance, we advise that you use one source of communication as we have multiple nurses helping with mychart messages and phone calls*    FMLA/paperwork: please allow 5-7 business days for FMLA/paperwork to be filled out so you can get the leave you need.     General Symptom Management Information:    Diarrhea:  Instructions for over the counter Imodium A-D  Take 2 loperamide (Imodium) with the first diarrhea episode, take 1 tablet with next diarrhea episode.  If diarrhea persists, take 1 loperamide (Imodium) every 2 hours during the day.  If you are still having diarrhea at night ,take 2 loperamind (Imodium) every 4 hours during the night, then go back to taking 1 tablet every 2 hours in the morning.   **Stop taking once 12 hours have passed with no diarrhea**    Constipation:   Over the counter instructions for Senekot and Miralax  Take 2 Senekot-S tablets or one capful of Miralax.  If you don?t have bowel movement after 2-3 days go to step 2.  If you do have a bowel movement, continue to take 1-2 Senekot-S tablets daily or one capful of Miralax daily.  Take 2 Senekot-S tablets or one capful of Miralax twice a day.    If you do not have a bowel movement in 1-2 days move to step 3.  Add two tablespoons of Milk of Magnesia followed by lots of water OR drink half a bottle of Magnesium Citrate, which is available over the counter    If you do not have a bowel movement after following this regimen, please call [phone] or send a MyChart message and we will get back to you as soon as possible for further assistance.     Nausea and Vomiting:  Please follow the regimen your provider has given you  and take prescriptions as directed.  If you are still experiencing these symptoms after following your regimen, please page [place] at the number above.      Use the following medications after each dose of chemo:  Prescription instructions following chemotherapy for Nausea and Vomiting    Decadron (dexamethasone): 4mg  tab. Take 1 tablet by mouth 2 times a day with meals on days 2-3 following chemotherapy (breakfast and after lunch). Do not take after 6pm to avoid trouble sleeping.    Zyprexa (Olanzapine): 5-10 mg tab. Take 1 tablet by mouth at bedtime days 1-4 of each chemo cycle  Zofran (ondansetron): 8 mg tab. Take 1 tab every 8 hours as needed for nausea and vomiting.  Compazine (prochlorperazine): 10 mg tab. 1 tablet by mouth every 6 hours as needed for nausea not controlled by Zofran.  Ativan (lorazepam): 0.5mg  tab. Take 1 tab by mouth every 6 hours as needed for nausea and vomiting not relieved by Zofran or compazine.  May also use every 6 hours for insomnia and anxiety. May also use one tab at bedtime as needed for insomnia.    Pain:  Please follow the regimen your provider has given you and take prescriptions as directed. If your pain is still unrelieved after following your regimen, please contact us. Our goal is for your pain to be controlled.  Since most pain medications are controlled substances, we are unable to call them into the pharmacy. They must be sent electronically by your provider with secure access. This may take several hours or days. It is recommended that you notify us when you will be out of medication 3+ days.    If your symptoms are relieved by utilizing the above regimens, there is no need to call or message.    **Page immediately during business hours to report any of the following, by calling the cancer center operator at [phone] and asking to have Dr. Stephens November nurses paged:**  Temperature of 100.5 or greater  Any signs/symptoms of infections: redness, swelling, warmth or tenderness  Shortness of breath that is new  Swelling of arms or legs  Uncontrolled pain or nausea/vomiting.     **If you are having difficulty breathing, chest pain, change in mental status or have lost consciousness, fallen and sustained an injury, severe pain, proceed to your closest emergency department. If your family is not able to take you, call 911 for an ambulance. Do not call or message and wait for instructions. If life threatening, GO TO THE CLOSEST ED!**    If you or anyone who accompanies you to your appointment have a history of falls or needs extra help getting into the cancer center, please utilize our free valet service at the front entrance of the Cancer Center at [facility].  We also have transportation assistance including wheelchairs at the front entrance.    Medical Records:  In regards to the transfer of medical records from facility to facility, please contact the Medical Records department at [facility] to have this completed.  Due to patient care needs and coordinating clinic, the nurses are not able to fax clinic notes, labs and imaging results to different providers, but Medical Records would be glad to do that for you.  Please contact Medical Records at [phone], their fax number is [phone].           Thank you for choosing the [facility] for your Oncology needs.

## 2021-06-16 ENCOUNTER — Encounter: Admit: 2021-06-16 | Discharge: 2021-06-16 | Payer: MEDICARE

## 2021-06-22 ENCOUNTER — Encounter: Admit: 2021-06-22 | Discharge: 2021-06-22 | Payer: MEDICARE

## 2021-06-22 DIAGNOSIS — I454 Nonspecific intraventricular block: Secondary | ICD-10-CM

## 2021-06-22 DIAGNOSIS — C786 Secondary malignant neoplasm of retroperitoneum and peritoneum: Secondary | ICD-10-CM

## 2021-06-22 DIAGNOSIS — C181 Malignant neoplasm of appendix: Secondary | ICD-10-CM

## 2021-06-22 DIAGNOSIS — C801 Malignant (primary) neoplasm, unspecified: Secondary | ICD-10-CM

## 2021-06-22 NOTE — Patient Instructions
Dr. Vanita Ingles MD Medical Oncologist specializing in Gastro-Intestinal malignancies   Lianne Moris APRN    Nurse: Rolla Etienne RN BSN. Phone: [phone] Fax: [phone], available Monday-Friday 8:00am- 4:00pm. After your new patient visit, we will be listening into the clinic appointments via Teams on the iPad. This is the best way to make sure we hear your concerns, the provider's recommendations and do the behind the scenes work to give you the best care.      Medication refills: please contact your pharmacy for medication refills, if they do not have any refills on file they will contact the office, make sure they have our correct contact information on file P: [phone], F: [phone]. Please allow 24 to 48 hours for medication refill requests.     MyChart Messages: All mychart messages are answered by the nurses, even if you select to send the message to Dr. Josiah Lobo or Denny Peon.  We often communicate with Dr. Josiah Lobo and Denny Peon on how to answer these messages, but all messages will come from the nurses.  Messages are answered 8:00am-4:00pm Monday-Friday, holidays excluded.    Phone Calls: We are hardly ever sitting at our desks where our phone is located as we are in clinic most days during the week. Please leave [place] a message as we check our messages several times per day.  It is our goal to answer these messages as soon as possible.  Messages are answered from 8:00am-4:00 pm Monday-Friday, holidays excluded.  Please make sure you leave your full name with the spelling, date of birth, reason for your call, and return number when leaving a message.     *For each time you contact us for assistance, we advise that you use one source of communication as we have multiple nurses helping with mychart messages and phone calls*    FMLA/paperwork: please allow 5-7 business days for FMLA/paperwork to be filled out so you can get the leave you need.     General Symptom Management Information:    Diarrhea:  Instructions for over the counter Imodium A-D  Take 2 loperamide (Imodium) with the first diarrhea episode, take 1 tablet with next diarrhea episode.  If diarrhea persists, take 1 loperamide (Imodium) every 2 hours during the day.  If you are still having diarrhea at night ,take 2 loperamind (Imodium) every 4 hours during the night, then go back to taking 1 tablet every 2 hours in the morning.   **Stop taking once 12 hours have passed with no diarrhea**    Constipation:   Over the counter instructions for Senekot and Miralax  Take 2 Senekot-S tablets or one capful of Miralax.  If you don?t have bowel movement after 2-3 days go to step 2.  If you do have a bowel movement, continue to take 1-2 Senekot-S tablets daily or one capful of Miralax daily.  Take 2 Senekot-S tablets or one capful of Miralax twice a day.    If you do not have a bowel movement in 1-2 days move to step 3.  Add two tablespoons of Milk of Magnesia followed by lots of water OR drink half a bottle of Magnesium Citrate, which is available over the counter    If you do not have a bowel movement after following this regimen, please call [phone] or send a MyChart message and we will get back to you as soon as possible for further assistance.     Nausea and Vomiting:  Please follow the regimen your provider has given you  and take prescriptions as directed.  If you are still experiencing these symptoms after following your regimen, please page [place] at the number above.      Use the following medications after each dose of chemo:  Prescription instructions following chemotherapy for Nausea and Vomiting    Decadron (dexamethasone): 4mg  tab. Take 1 tablet by mouth 2 times a day with meals on days 2-3 following chemotherapy (breakfast and after lunch). Do not take after 6pm to avoid trouble sleeping.    Zyprexa (Olanzapine): 5-10 mg tab. Take 1 tablet by mouth at bedtime days 1-4 of each chemo cycle  Zofran (ondansetron): 8 mg tab. Take 1 tab every 8 hours as needed for nausea and vomiting.  Compazine (prochlorperazine): 10 mg tab. 1 tablet by mouth every 6 hours as needed for nausea not controlled by Zofran.  Ativan (lorazepam): 0.5mg  tab. Take 1 tab by mouth every 6 hours as needed for nausea and vomiting not relieved by Zofran or compazine.  May also use every 6 hours for insomnia and anxiety. May also use one tab at bedtime as needed for insomnia.    Pain:  Please follow the regimen your provider has given you and take prescriptions as directed. If your pain is still unrelieved after following your regimen, please contact us. Our goal is for your pain to be controlled.  Since most pain medications are controlled substances, we are unable to call them into the pharmacy. They must be sent electronically by your provider with secure access. This may take several hours or days. It is recommended that you notify us when you will be out of medication 3+ days.    If your symptoms are relieved by utilizing the above regimens, there is no need to call or message.    **Page immediately during business hours to report any of the following, by calling the cancer center operator at [phone] and asking to have Dr. Stephens November nurses paged:**  Temperature of 100.5 or greater  Any signs/symptoms of infections: redness, swelling, warmth or tenderness  Shortness of breath that is new  Swelling of arms or legs  Uncontrolled pain or nausea/vomiting.     **If you are having difficulty breathing, chest pain, change in mental status or have lost consciousness, fallen and sustained an injury, severe pain, proceed to your closest emergency department. If your family is not able to take you, call 911 for an ambulance. Do not call or message and wait for instructions. If life threatening, GO TO THE CLOSEST ED!**    If you or anyone who accompanies you to your appointment have a history of falls or needs extra help getting into the cancer center, please utilize our free valet service at the front entrance of the Cancer Center at [facility].  We also have transportation assistance including wheelchairs at the front entrance.    Medical Records:  In regards to the transfer of medical records from facility to facility, please contact the Medical Records department at [facility] to have this completed.  Due to patient care needs and coordinating clinic, the nurses are not able to fax clinic notes, labs and imaging results to different providers, but Medical Records would be glad to do that for you.  Please contact Medical Records at [phone], their fax number is [phone].           Thank you for choosing the [facility] for your Oncology needs.

## 2021-06-29 ENCOUNTER — Encounter: Admit: 2021-06-29 | Discharge: 2021-06-29 | Payer: MEDICARE

## 2021-07-02 ENCOUNTER — Encounter: Admit: 2021-07-02 | Discharge: 2021-07-02 | Payer: MEDICARE

## 2021-07-02 MED ORDER — ATROPINE 0.4 MG/ML IJ SOLN
0.4 mg | INTRAVENOUS | 0 refills | PRN
Start: 2021-07-02 — End: ?

## 2021-07-02 MED ORDER — DEXAMETHASONE 6 MG PO TAB
12 mg | Freq: Once | ORAL | 0 refills
Start: 2021-07-02 — End: ?

## 2021-07-02 MED ORDER — FLUOROURACIL IV AMB PUMP
2400 mg/m2 | Freq: Once | INTRAVENOUS | 0 refills
Start: 2021-07-02 — End: ?

## 2021-07-02 MED ORDER — PALONOSETRON 0.25 MG/5 ML IV SOLN
.25 mg | Freq: Once | INTRAVENOUS | 0 refills
Start: 2021-07-02 — End: ?

## 2021-07-05 ENCOUNTER — Encounter: Admit: 2021-07-05 | Discharge: 2021-07-05 | Payer: MEDICARE

## 2021-07-05 DIAGNOSIS — C786 Secondary malignant neoplasm of retroperitoneum and peritoneum: Secondary | ICD-10-CM

## 2021-07-06 ENCOUNTER — Encounter: Admit: 2021-07-06 | Discharge: 2021-07-06 | Payer: MEDICARE

## 2021-07-06 DIAGNOSIS — C786 Secondary malignant neoplasm of retroperitoneum and peritoneum: Secondary | ICD-10-CM

## 2021-07-06 DIAGNOSIS — C181 Malignant neoplasm of appendix: Secondary | ICD-10-CM

## 2021-07-06 DIAGNOSIS — C801 Malignant (primary) neoplasm, unspecified: Secondary | ICD-10-CM

## 2021-07-06 DIAGNOSIS — I454 Nonspecific intraventricular block: Secondary | ICD-10-CM

## 2021-07-06 LAB — FOLATE, SERUM: SERUM FOLATE: 15 ng/mL (ref >3.9)

## 2021-07-06 LAB — VITAMIN B12: VITAMIN B12: 274 pg/mL (ref 180–914)

## 2021-07-06 MED ORDER — BEVACIZUMAB-AWWB IVPB
5 mg/kg | Freq: Once | INTRAVENOUS | 0 refills | Status: CP
Start: 2021-07-06 — End: ?
  Administered 2021-07-06 (×2): 260.5 mg via INTRAVENOUS

## 2021-07-06 MED ORDER — DEXAMETHASONE 6 MG PO TAB
12 mg | Freq: Once | ORAL | 0 refills | Status: CP
Start: 2021-07-06 — End: ?
  Administered 2021-07-06: 17:00:00 12 mg via ORAL

## 2021-07-06 MED ORDER — PALONOSETRON 0.25 MG/5 ML IV SOLN
.25 mg | Freq: Once | INTRAVENOUS | 0 refills | Status: CP
Start: 2021-07-06 — End: ?
  Administered 2021-07-06: 17:00:00 0.25 mg via INTRAVENOUS

## 2021-07-06 MED ORDER — ATROPINE 0.4 MG/ML IJ SOLN
.4 mg | INTRAVENOUS | 0 refills | Status: AC | PRN
Start: 2021-07-06 — End: ?
  Administered 2021-07-06: 18:00:00 0.4 mg via INTRAVENOUS

## 2021-07-06 MED ORDER — IRINOTECAN IVPB
110 mg/m2 | Freq: Once | INTRAVENOUS | 0 refills | Status: CP
Start: 2021-07-06 — End: ?
  Administered 2021-07-06 (×3): 166.2 mg via INTRAVENOUS

## 2021-07-06 MED ORDER — SODIUM CHLORIDE 0.9 % IV SOLP
1000 mL | Freq: Once | INTRAVENOUS | 0 refills | Status: CP
Start: 2021-07-06 — End: ?
  Administered 2021-07-06: 17:00:00 1000 mL via INTRAVENOUS

## 2021-07-06 MED ORDER — FLUOROURACIL IV AMB PUMP
2400 mg/m2 | Freq: Once | INTRAVENOUS | 0 refills | Status: CP
Start: 2021-07-06 — End: ?
  Administered 2021-07-06 (×3): 3624 mg via INTRAVENOUS

## 2021-07-06 NOTE — Progress Notes
Patient arrived to [place] treatment for Cycle 12 Day 1 MVASI / Folfiri after being seen in clinic by Lanelle Bal, APRN; please refer to clinic note for assessment details.    MVASI and Irinotecan given w/o complications, patient tolerated well. PAC positive for blood return, flushed with saline and connected to ambulatory 5FU pump. All lines secure, clamps open and pump infusing when patient left CC treatment. Patient declined copy of labs and AVS. All questions and concerns addressed. Patient left CC treatment in stable condition.    CHEMO NOTE  Verified chemo consent signed and in chart.    Blood return positive via: Port (Single)    BSA and dose double checked (agree with orders as written) with: yes, see MAR    Labs/applicable tests checked: CBC and Comprehensive Metabolic Panel (CMP)    Chemo regimen: Drug/cycle/day: C12D1    bevacizumab-awwb (MVASI) 260.5 mg in sodium chloride 0.9% (NS) 110.42 mL IVPB     irinotecan (CAMPTOSAR) 166.2 mg in dextrose 5% (D5W) 508.31 mL IVPB    fluorouraciL (ADRUCIL) 3,624 mg in sodium chloride 0.9% (NS) 92 mL IV amb pump     Rate verified and armband double check with second RN: yes    Patient education offered and stated understanding. Denies questions at this time.

## 2021-07-09 ENCOUNTER — Encounter: Admit: 2021-07-09 | Discharge: 2021-07-09 | Payer: MEDICARE

## 2021-07-09 DIAGNOSIS — C181 Malignant neoplasm of appendix: Secondary | ICD-10-CM

## 2021-07-09 DIAGNOSIS — C801 Malignant (primary) neoplasm, unspecified: Secondary | ICD-10-CM

## 2021-07-09 DIAGNOSIS — I454 Nonspecific intraventricular block: Secondary | ICD-10-CM

## 2021-07-13 ENCOUNTER — Encounter: Admit: 2021-07-13 | Discharge: 2021-07-13 | Payer: MEDICARE

## 2021-07-13 MED ORDER — DIPHENOXYLATE-ATROPINE 2.5-0.025 MG PO TAB
1 | ORAL_TABLET | Freq: Four times a day (QID) | ORAL | 3 refills | 15.00000 days | Status: AC | PRN
Start: 2021-07-13 — End: ?

## 2021-07-20 ENCOUNTER — Encounter: Admit: 2021-07-20 | Discharge: 2021-07-20 | Payer: MEDICARE

## 2021-07-20 DIAGNOSIS — I454 Nonspecific intraventricular block: Secondary | ICD-10-CM

## 2021-07-20 DIAGNOSIS — C181 Malignant neoplasm of appendix: Secondary | ICD-10-CM

## 2021-07-20 DIAGNOSIS — C786 Secondary malignant neoplasm of retroperitoneum and peritoneum: Secondary | ICD-10-CM

## 2021-07-20 DIAGNOSIS — C801 Malignant (primary) neoplasm, unspecified: Secondary | ICD-10-CM

## 2021-07-20 DIAGNOSIS — K9413 Enterostomy malfunction: Secondary | ICD-10-CM

## 2021-07-20 MED ORDER — FLUOROURACIL IV AMB PUMP
2400 mg/m2 | Freq: Once | INTRAVENOUS | 0 refills | Status: CP
Start: 2021-07-20 — End: ?
  Administered 2021-07-20 (×3): 3624 mg via INTRAVENOUS

## 2021-07-20 MED ORDER — SODIUM CHLORIDE 0.9 % IV SOLP
1000 mL | Freq: Once | INTRAVENOUS | 0 refills | Status: CP
Start: 2021-07-20 — End: ?
  Administered 2021-07-20: 18:00:00 1000 mL via INTRAVENOUS

## 2021-07-20 MED ORDER — DEXAMETHASONE 6 MG PO TAB
12 mg | Freq: Once | ORAL | 0 refills | Status: CP
Start: 2021-07-20 — End: ?
  Administered 2021-07-20: 18:00:00 12 mg via ORAL

## 2021-07-20 MED ORDER — PALONOSETRON 0.25 MG/5 ML IV SOLN
.25 mg | Freq: Once | INTRAVENOUS | 0 refills | Status: CP
Start: 2021-07-20 — End: ?
  Administered 2021-07-20: 18:00:00 0.25 mg via INTRAVENOUS

## 2021-07-20 MED ORDER — BEVACIZUMAB-AWWB IVPB
5 mg/kg | Freq: Once | INTRAVENOUS | 0 refills | Status: CP
Start: 2021-07-20 — End: ?
  Administered 2021-07-20 (×2): 260.5 mg via INTRAVENOUS

## 2021-07-20 MED ORDER — MAGNESIUM SULFATE IN D5W 1 GRAM/100 ML IV PGBK (CC ONLY)
1 g | Freq: Once | INTRAVENOUS | 0 refills | Status: CP
Start: 2021-07-20 — End: ?
  Administered 2021-07-20: 18:00:00 1 g via INTRAVENOUS

## 2021-07-20 MED ORDER — ATROPINE 0.4 MG/ML IJ SOLN
.4 mg | INTRAVENOUS | 0 refills | Status: AC | PRN
Start: 2021-07-20 — End: ?
  Administered 2021-07-20: 19:00:00 0.4 mg via INTRAVENOUS

## 2021-07-20 MED ORDER — IRINOTECAN IVPB
110 mg/m2 | Freq: Once | INTRAVENOUS | 0 refills | Status: CP
Start: 2021-07-20 — End: ?
  Administered 2021-07-20 (×3): 166.2 mg via INTRAVENOUS

## 2021-07-20 NOTE — Progress Notes
Date of Service: 07/20/2021    SUBJECTIVE:             Reason for Visit:  No chief complaint on file.    Vicki Buchanan is a 66 y.o. 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  02/19/17 diagnostic laparoscopy with biopsies of the right diaphragm, right liver, right fallopian tube and left pelvis. 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  Planned for exploratory laparotomy with CRS/HIPEC in combination with a TAH/BSO in 03/2017.  Developed intestinal obstruction and taken to OR for CRS/HIPEC surgery; however multiple adhesions, omental cracking. CRS/HIPEC was aborted.  Immunotherapy on epacadostat?+ sirolimus?trial 12/24/17-05/21/18.  She stopped study drug on 05/21/18 for mitomycin HIPEC trial at [facility].  On 05/28/18 diagnostic laparoscopy, evacuation of mucinous ascites 1.5L, peritoneal biopsy, laparoscopic HIPEC with mitomycin-C in [place] by Dr. Daryl Eastern.  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  Completed second laparoscopic HIPEC on 06/25/18.  Again 1.5L removed.  09/12/18 underwent debulking at [facility] in [place], [place].  Removed 98% by Dr. [name].  Discharged on 09/30/18.  She had L pleural effusion requiring chest tube drainage; intra-abd abscess 10/2018; discharged on 11/04/18 with drain and IV cefepime and Diflucan. Her ureteral stents were removed and ostomy relocated in 10/2018.  Hospitalized in 01/2019 for AKI 2/2 dehydration and likely PE (V/Q scan and trop elevation). CEA increasing again. Repeat CT CAP 03/20/19 with stable to slight increase in cancer and RUL nodule.  Discussed observation vs treatment. She chose observation and is considering repeat CRS + HIPEC via Dr. [name] in [place].  CT scans from 09/24/19 of the abdomen, without contrast, appear stable.  The peritoneal thickening, carcinomatosis, retroperitoneal lymph nodes, a small amount of fluid collection or trace ascites, appear to be stable compared to 3 months ago. 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  CT scan on 08/17/20 showed a right lung mass that is slightly increased from 11 mm to 14 mm. There are some new lung nodules that have developed, a small one in the left lung, a new nodule in the right lung. Inside the abdomen we are seeing continued increase in size of the peritoneal nodules. The largest measuring 14 mm compared to 12 mm.  The nodule lower in the pelvis increased from 16 mm to 21 mm.  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  TMB = 1.3.  S/p CRS w/ ostomy revision and tumor removal around stoma on 10/14/20.  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 [name] PA from [facility] in [place] called to relay their TB recommendations for patient.  Recommendations to proceed with FOLFIRI/Avastin with holding Avastin 6-8 weeks post-op. They also request if our office can repeat genomic testing.  Repeat TEMPUS from 10/14/20.  Pre-screen for KRAS G12D inhibitor trial, if not eligible will plan for FOLFIRI +/- bevacizumab.       Interval Hx 07/20/21:    She presents for follow-up. She is feeling better with the lower dose of irinotecan. Her energy is better. She was able to manage her diarrhea well. She has been taking tylenol bid for the ankle pain after her sprain. She is also on antibiotics every other day for UTI suppression. She still has a sore next to her stoma. Nothing seems to be helping.  Review of Systems   Constitutional: Positive for fatigue. Negative for activity change, appetite change, chills, diaphoresis, fever and unexpected weight change.   HENT: Negative for facial swelling, mouth sores, sore throat, trouble swallowing and voice change.    Eyes: Negative.  Negative for visual disturbance.   Respiratory: Negative for cough, chest tightness, shortness of breath and wheezing.    Cardiovascular: Negative.  Negative for chest pain, palpitations and leg swelling.   Gastrointestinal: Positive for diarrhea. Negative for abdominal distention, abdominal pain, anal bleeding, blood in stool, constipation, nausea, rectal pain and vomiting.   Genitourinary: Negative for difficulty urinating, dysuria and hematuria.   Musculoskeletal: Negative.  Negative for arthralgias, back pain, neck pain and neck stiffness.        Ankle sore   Skin: Positive for wound. Negative for color change, pallor and rash.   Neurological: Negative for dizziness, weakness, light-headedness, numbness and headaches.   Hematological: Does not bruise/bleed easily.   Psychiatric/Behavioral: Negative for agitation. The patient is not nervous/anxious.        Medical History:   Diagnosis Date   ? Bundle branch block    ? Cancer [facility])    ? Cancer of appendix [facility])      Surgical History:   Procedure Laterality Date   ? CESAREAN SECTION  1981   ? HX CHOLECYSTECTOMY  1995   ? LAPAROSCOPY  02/2017    biopsy peritoneal   ? COLONOSCOPY DIAGNOSTIC WITH SPECIMEN COLLECTION BY BRUSHING/ WASHING - FLEXIBLE N/A 03/19/2017    Performed by Everardo All, MD at [facility] ENDO   ? COLONOSCOPY WITH BIOPSY - FLEXIBLE  03/19/2017    Performed by Everardo All, MD at [facility] ENDO   ? EXPLORATORY LAPAROTOMY, DIVERTING LOOP ILEOSTOMY N/A 04/03/2017    Performed by Loralie Champagne, MD at [facility] OR   ? INSERTION TUNNELED CENTRAL VENOUS CATHETER - AGE 40 YEARS AND OVER Left 04/23/2017    Performed by Loralie Champagne, MD at [facility] OR   ? CYSTOURETHROSCOPY WITH INDWELLING URETERAL STENT INSERTION Bilateral 04/09/2018    Performed by Earl Many, MD at [facility] OR   ? CYSTOURETHROSCOPY WITH URETERAL CATHETERIZATION WITH/ WITHOUT IRRIGATION/ INSTILLATION/ URETEROPYELOGRAPHY Bilateral 04/09/2018    Performed by Earl Many, MD at [facility] OR   ? CYSTOURETHROSCOPY WITH INDWELLING URETERAL STENT EXCHANGE (RIGHT 6 x 26cm / LEFT 6 x 28cm) Bilateral 08/08/2018 Performed by Earl Many, MD at [facility] OR   ? RETROGRADE UROGRAPHY WITH/ WITHOUT KUB Bilateral 08/08/2018    Performed by Earl Many, MD at [facility] OR   ? ILEOSTOMY OR JEJUNOSTOMY Right    ? TUNNELED VENOUS PORT PLACEMENT Left    ? URETER STENT PLACEMENT Bilateral      Family History   Problem Relation Age of Onset   ? Diabetes Mother    ? Cancer Father    ? Diabetes Brother    ? Cancer-Lung Paternal Aunt    ? Cancer-Breast Maternal Grandmother    ? Diabetes Paternal Grandmother      Social History     Socioeconomic History   ? Marital status: Married   Tobacco Use   ? Smoking status: Never   ? Smokeless tobacco: Never   Vaping Use   ? Vaping Use: Never used   Substance and Sexual Activity   ? Alcohol use: No     Comment: rarely   ? Drug use: No         OBJECTIVE:         ?  cefdinir (OMNICEF) 300 mg capsule Take one capsule by mouth. FOR 10 DAYS   ? Cetirizine (ZYRTEC) 10 mg cap Take one capsule by mouth as Needed.   ? dexAMETHasone (DECADRON) 4 mg tablet 1 tab by mouth with breakfast and afternoon snack on days 2 & 3 following each chemotherapy cycle. Do not take after 6 pm to avoid insomnia.   ? diphenoxylate-atropine (LOMOTIL) 2.5-0.025 mg tablet Take one tablet by mouth four times daily as needed for Diarrhea.   ? esomeprazole DR (NEXIUM) 40 mg capsule Take one capsule by mouth daily. Take on an empty stomach at least 1 hour before or 2 hours after food.   ? famotidine (PEPCID AC) 10 mg tablet Take one tablet by mouth twice daily.   ? loperamide (IMODIUM A-D) 2 mg capsule Take one pill by mouth four times daily as needed   ? LORazepam (ATIVAN) 0.5 mg tablet Take 1-2 tabs by mouth every 6 hrs as needed N/V not controlled by Zofran or Compazine. May also use every 6 hrs as needed anxiety or at bedtime insomnia.   ? magnesium citrate 200 mg tab Take one tablet by mouth daily.   ? MULTIVITAMIN PO Take 1 tablet by mouth daily.   ? ondansetron (ZOFRAN ODT) 8 mg rapid dissolve tablet Dissolve one tablet by mouth every 8 hours as needed for Nausea or Vomiting. Place on tongue to disolve.   ? opium tincture oral solution Take 0.6 mL by mouth every 6 hours.   ? potassium chloride (KLOR-CON SPRINKLE) 10 mEq capsule Take one capsule by mouth twice daily. If potassium < 3.5. Take with a meal and a full glass of water.   ? prochlorperazine maleate (COMPAZINE) 10 mg tablet 1 tablet by mouth every 6 hours as needed for nausea not controlled by Zofran.   ? psyllium husk (METAMUCIL) 0.4 gram cap    ? sodium chloride 0.9% (NS) 0.9 % 1,000 mL with potassium chloride 2 mEq/mL 20 mEq, magnesium sulfate 4 mEq/mL (50 %) 2 g IV infusion Administer 1L NS with K and 2g Mg once weekly via outpatient infusion.     Vitals:    07/20/21 1112   BP: 122/84   BP Source: Arm, Right Upper   Pulse: 76   Temp: 36.4 ?C (97.6 ?F)   Resp: 16   SpO2: 100%   TempSrc: Temporal   PainSc: Zero   Weight: 51.3 kg (113 lb)     Body mass index is 21.06 kg/m?Marland Kitchen     Pain Score: Zero       Pain Addressed:  N/A    Patient Evaluated for a Clinical Trial: Patient not eligible for a treatment trial (including not needing treatment, needs palliative care, in remission).     [place] Cooperative Oncology Group performance status is 1, Restricted in physically strenuous activity but ambulatory and able to carry out work of a light or sedentary nature, e.g., light house work, office work.       Physical Exam  Vitals reviewed.   Constitutional:       General: She is not in acute distress.     Appearance: Normal appearance. She is well-developed. She is not ill-appearing, toxic-appearing or diaphoretic.   HENT:      Head: Normocephalic and atraumatic.      Nose: Nose normal. No rhinorrhea.      Mouth/Throat:      Mouth: Mucous membranes are moist. Mucous membranes are not pale. No oral lesions.  Pharynx: Oropharynx is clear. No oropharyngeal exudate or posterior oropharyngeal erythema.      Tonsils: No tonsillar abscesses.   Eyes:      General: No scleral icterus.        Right eye: No discharge.         Left eye: No discharge.      Extraocular Movements: Extraocular movements intact.      Conjunctiva/sclera: Conjunctivae normal.      Pupils: Pupils are equal, round, and reactive to light.   Cardiovascular:      Rate and Rhythm: Normal rate and regular rhythm.      Pulses: Normal pulses.      Heart sounds: Normal heart sounds. No murmur heard.    No gallop.   Pulmonary:      Effort: Pulmonary effort is normal. No respiratory distress.      Breath sounds: Normal breath sounds. No stridor. No wheezing, rhonchi or rales.   Chest:      Chest wall: No tenderness.      Comments: Port site WNL  Abdominal:      General: A surgical scar is present. Bowel sounds are normal. There is no distension.      Palpations: Abdomen is soft. There is no mass.      Tenderness: There is no abdominal tenderness. There is no guarding or rebound.      Hernia: No hernia is present.          Comments: Nodule palpated above stoma.    Musculoskeletal:         General: No swelling, tenderness, deformity or signs of injury. Normal range of motion.      Cervical back: Normal range of motion and neck supple. No rigidity. No muscular tenderness.      Right lower leg: No edema.      Left lower leg: No edema.   Lymphadenopathy:      Cervical: No cervical adenopathy.   Skin:     General: Skin is warm and dry.      Coloration: Skin is not jaundiced or pale.      Findings: No bruising, erythema, lesion or rash.   Neurological:      General: No focal deficit present.      Mental Status: She is alert and oriented to person, place, and time. Mental status is at baseline.      Cranial Nerves: No cranial nerve deficit.      Motor: No weakness.      Coordination: Coordination normal.      Gait: Gait normal.   Psychiatric:         Mood and Affect: Mood normal.         Behavior: Behavior normal.         Thought Content: Thought content normal.         Judgment: Judgment normal.            CBC w/DIFF  CBC with Diff Latest Ref Rng & Units 07/20/2021 07/06/2021 06/22/2021 06/08/2021 05/25/2021   WBC 4.5 - 11.0 K/UL 5.9 10.5 4.2(L) 4.3(L) 5.1   RBC 4.0 - 5.0 M/UL 2.84(L) 2.61(L) 2.62(L) 2.96(L) 2.99(L)   HGB 12.0 - 15.0 GM/DL 10.1(L) 9.2(L) 9.1(L) 10.3(L) 10.0(L)   HCT 36 - 45 % 29.9(L) 27.2(L) 26.9(L) 30.2(L) 30.5(L)   MCV 80 - 100 FL 105.2(H) 104.0(H) 102.5(H) 102.1(H) 101.8(H)   MCH 26 - 34 PG 35.6(H) 35.1(H) 34.6(H) 34.8(H) 33.3   MCHC 32.0 - 36.0 G/DL 45.4 33.8  33.7 34.0 32.7   RDW 11 - 15 % 16.5(H) 16.9(H) 16.1(H) 16.3(H) 15.3(H)   PLT 150 - 400 K/UL 384 469(H) 496(H) 445(H) 404(H)   MPV 7 - 11 FL 7.8 7.7 7.7 7.5 7.7   NEUT 41 - 77 % 47 59 36(L) 30(L) 39(L)   ANC 1.8 - 7.0 K/UL 2.80 6.30 1.60(L) 1.30(L) 2.00   LYMA 24 - 44 % 30 20(L) 33 38 33   ALYM 1.0 - 4.8 K/UL 1.80 2.10 1.40 1.70 1.70   MONA 4 - 12 % 15(H) 16(H) 27(H) 28(H) 23(H)   AMONO 0 - 0.80 K/UL 0.90(H) 1.60(H) 1.10(H) 1.20(H) 1.20(H)   EOSA 0 - 5 % 7(H) 4 3 3 4    AEOS 0 - 0.45 K/UL 0.40 0.40 0.10 0.10 0.20   BASA 0 - 2 % 1 1 1 1 1    ABAS 0 - 0.20 K/UL 0.10 0.10 0.00 0.00 0.00     Comprehensive Metabolic Profile  CMP Latest Ref Rng & Units 07/20/2021 07/06/2021 06/22/2021 06/08/2021 05/25/2021   NA 137 - 147 MMOL/L 140 139 141 138 139   K 3.5 - 5.1 MMOL/L 4.5 4.5 3.8 4.4 3.4(L)   CL 98 - 110 MMOL/L 105 108 107 102 100   CO2 21 - 30 MMOL/L 27 26 27 28  32(H)   GAP 3 - 12 8 5 7 8 7    BUN 7 - 25 MG/DL 16(X) 09(U) 18 25 04(V)   CR 0.4 - 1.00 MG/DL 4.09(W) 1.19(J) 4.78(G) 1.92(H) 1.87(H)   GLUX 70 - 100 MG/DL 956(O) 130(Q) 657(Q) 95 104(H)   CA 8.5 - 10.6 MG/DL 9.4 9.2 8.8 9.5 9.2   TP 6.0 - 8.0 G/DL 7.8 7.0 6.6 7.3 6.9   ALB 3.5 - 5.0 G/DL 4.1 3.6 3.5 3.9 3.6   ALKP 25 - 110 U/L 310(H) 293(H) 362(H) 302(H) 299(H)   ALT 7 - 56 U/L 139(H) 34 27 28 43   TBILI 0.3 - 1.2 MG/DL 0.5 0.4 0.5 0.5 0.5   GFR >60 mL/min - - - - -   GFRAA >60 mL/min - - - - -     Tumor Markers  Lab Results   Component Value Date    CEA 486.4 (H) 07/20/2021    CEA 515.0 (H) 07/06/2021    CEA 521.6 (H) 06/22/2021    CEA 620.8 (H) 06/08/2021    CEA 520.8 (H) 05/25/2021    CEA 629.6 (H) 05/11/2021    CEA 388.5 (H) 04/27/2021    CEA 983.2 (H) 03/30/2021    CEA 1,179.5 (H) 03/16/2021    CEA 1,276.6 (H) 03/02/2021    CA199 <1 07/20/2021    CA199 <1 07/06/2021    CA199 <1 06/22/2021    CA199 <1 06/08/2021    CA199 <1 05/25/2021    CA199 <1 05/11/2021    CA199 <1 04/27/2021    CA199 <1 03/30/2021    CA199 <1 03/16/2021    CA199 <1 03/02/2021    CA125 41 (H) 01/21/2018    CA125 42 (H) 11/21/2017    CA125 46 (H) 11/05/2017    CA125 41 (H) 10/22/2017    CA125 34 10/08/2017    CA125 34 09/24/2017    CA125 31 09/10/2017    CA125 29 08/27/2017    CA125 34 08/15/2017    CA125 39 (H) 07/30/2017           Imaging:  - 04/27/21 CT CAP without contrast  CHEST:   1. ?No significant change  in small indeterminate anterior epiphrenic lymph nodes. No new thoracic lymphadenopathy.   2. No significant change to further mild increase in size of pulmonary and pleural metastases.   3. Increasing nodular and tree-in-bud opacities in the lingula and development of mild focal consolidation in the left lower lobe, indeterminate and may be infectious/inflammatory nodules or potentially pulmonary metastases. ?     ABDOMEN AND PELVIS:   1. ?Grossly unchanged extensive peritoneal carcinomatosis.   2. No significant change in small residual indeterminate retroperitoneal lymph nodes. No discrete new abdominopelvic lymphadenopathy.   3. Grossly unchanged right anterior abdominal wall metastases.     CT CHEST WO CONTRAST  Narrative: CT CHEST, ABDOMEN AND PELVIS    Clinical Indication: Female, 66 years old. Cancer of appendix. Peritoneal carcinomatosis.    Technique: Multiple contiguous axial images were obtained through the chest, abdomen and pelvis without IV contrast material. Post processing coronal and sagittal reconstruction images were made from the axial images.      IV contrast: None  Bowel contrast:  None    Comparison: Noncontrast CT chest, abdomen, and pelvis on 02/16/2021.    CHEST FINDINGS:    Evaluation of the mediastinum and hila, including the vasculature and for lymphadenopathy, is limited without the use of IV contrast.    Lower Neck: Unremarkable.    Axilla, Mediastinum and Hila: No significant change in clustered indeterminate epiphrenic lymph nodes (series 2 image 82). No new thoracic lymphadenopathy.     Heart and Great Vessels: Left subclavian chest port terminates in the SVC. Heart size normal. At least mild coronary artery calcifications. Thoracic aorta normal in caliber. Mild calcified atherosclerotic plaque.    Airway, Lungs and Pleura: Several areas of peripheral mucous plugging in the lingula. Minimal scarring and/or atelectasis. Development of mild focal consolidation in the dependent left lower lobe (series 2 image 55). Increase in focal tree-in-bud and nodular opacities in the lingula with adjacent areas of mucus plugging (for example series 2 images 65 through 71). No pleural effusion. No significant change to further mild increase in size of pulmonary and pleural metastases (for example series 2 images 22 and 29). Previously measured metastases as follows:    Right upper lobe, 2.2 cm (series 2 image 13) previously 1.5 cm.  Right lower lobe, 1 cm (series 2 image 35) previously 1 cm.    Chest Wall and Osseous Structures: Lower cervical and mild thoracic spondylosis. No destructive osseous lesion.     Abdomen and Pelvis Findings:    Limited evaluation without the use of IV contrast which includes the viscera and vasculature.    Liver and Biliary system: Liver is normal in size. Persistent scalloping of the hepatic margins from peritoneal metastases. Unchanged right hepatic cyst. No obvious new parenchymal hepatic mass on noncontrast CT. Persistent intrahepatic biliary ductal dilatation, greatest in the left lobe, likely from periportal peritoneal metastases.     Spleen: Splenectomy.    Adrenal Glands and Kidneys: Adrenal glands unremarkable. Several subcentimeter renal calculi. No hydronephrosis.    Pancreas and Retroperitoneum: Unopacified pancreas is partially obscured due to extensive peritoneal metastases. No significant change in small indeterminate retroperitoneal lymph nodes. No new lymphadenopathy.    Aorta and Major Vessels: Normal caliber abdominal aorta. Mild calcified atherosclerotic plaque.     Bowel, Mesentery and Peritoneal space: Distal colonic anastomosis. Omentectomy. Right lower quadrant ostomy. No bowel obstruction. Overall no significant change in extensive peritoneal metastases, many of which are partially calcified.    Pelvis: Hysterectomy. Persistent gas and fluid  distention of the vaginal cuff. Urinary bladder is incompletely distended and not well evaluated. No discrete new pelvic lymphadenopathy.    Abdominal wall and Osseous Structures: Midline ventral abdominal incision scar. Grossly unchanged right anterior abdominal wall metastases. Previously measured subcutaneous right ventral abdominal wall metastasis measures 1.9 x 1.6 cm (series 2 image 141) disease previously 1.9 x 1.6 cm. No destructive osseous lesion.  Impression: CHEST:  1.  No significant change in small indeterminate anterior epiphrenic lymph nodes. No new thoracic lymphadenopathy.    2. No significant change to further mild increase in size of pulmonary and pleural metastases.    3. Increasing nodular and tree-in-bud opacities in the lingula and development of mild focal consolidation in the left lower lobe, indeterminate and may be infectious/inflammatory nodules or potentially pulmonary metastases.      ABDOMEN AND PELVIS:  1.  Grossly unchanged extensive peritoneal carcinomatosis.    2. No significant change in small residual indeterminate retroperitoneal lymph nodes. No discrete new abdominopelvic lymphadenopathy.    3. Grossly unchanged right anterior abdominal wall metastases.     Finalized by Ancil Boozer, M.D. on 04/27/2021 8:58 AM. Dictated by Ancil Boozer, M.D. on 04/27/2021 8:28 AM.  CT ABD/PELV WO CONTRAST  Narrative: CT CHEST, ABDOMEN AND PELVIS    Clinical Indication: Female, 66 years old. Cancer of appendix. Peritoneal carcinomatosis.    Technique: Multiple contiguous axial images were obtained through the chest, abdomen and pelvis without IV contrast material. Post processing coronal and sagittal reconstruction images were made from the axial images.      IV contrast: None  Bowel contrast:  None    Comparison: Noncontrast CT chest, abdomen, and pelvis on 02/16/2021.    CHEST FINDINGS:    Evaluation of the mediastinum and hila, including the vasculature and for lymphadenopathy, is limited without the use of IV contrast.    Lower Neck: Unremarkable.    Axilla, Mediastinum and Hila: No significant change in clustered indeterminate epiphrenic lymph nodes (series 2 image 82). No new thoracic lymphadenopathy.     Heart and Great Vessels: Left subclavian chest port terminates in the SVC. Heart size normal. At least mild coronary artery calcifications. Thoracic aorta normal in caliber. Mild calcified atherosclerotic plaque.    Airway, Lungs and Pleura: Several areas of peripheral mucous plugging in the lingula. Minimal scarring and/or atelectasis. Development of mild focal consolidation in the dependent left lower lobe (series 2 image 55). Increase in focal tree-in-bud and nodular opacities in the lingula with adjacent areas of mucus plugging (for example series 2 images 65 through 71). No pleural effusion. No significant change to further mild increase in size of pulmonary and pleural metastases (for example series 2 images 22 and 29). Previously measured metastases as follows:    Right upper lobe, 2.2 cm (series 2 image 13) previously 1.5 cm.  Right lower lobe, 1 cm (series 2 image 35) previously 1 cm.    Chest Wall and Osseous Structures: Lower cervical and mild thoracic spondylosis. No destructive osseous lesion.     Abdomen and Pelvis Findings:    Limited evaluation without the use of IV contrast which includes the viscera and vasculature.    Liver and Biliary system: Liver is normal in size. Persistent scalloping of the hepatic margins from peritoneal metastases. Unchanged right hepatic cyst. No obvious new parenchymal hepatic mass on noncontrast CT. Persistent intrahepatic biliary ductal dilatation, greatest in the left lobe, likely from periportal peritoneal metastases.     Spleen: Splenectomy.    Adrenal  Glands and Kidneys: Adrenal glands unremarkable. Several subcentimeter renal calculi. No hydronephrosis.    Pancreas and Retroperitoneum: Unopacified pancreas is partially obscured due to extensive peritoneal metastases. No significant change in small indeterminate retroperitoneal lymph nodes. No new lymphadenopathy.    Aorta and Major Vessels: Normal caliber abdominal aorta. Mild calcified atherosclerotic plaque.     Bowel, Mesentery and Peritoneal space: Distal colonic anastomosis. Omentectomy. Right lower quadrant ostomy. No bowel obstruction. Overall no significant change in extensive peritoneal metastases, many of which are partially calcified.    Pelvis: Hysterectomy. Persistent gas and fluid distention of the vaginal cuff. Urinary bladder is incompletely distended and not well evaluated. No discrete new pelvic lymphadenopathy.    Abdominal wall and Osseous Structures: Midline ventral abdominal incision scar. Grossly unchanged right anterior abdominal wall metastases. Previously measured subcutaneous right ventral abdominal wall metastasis measures 1.9 x 1.6 cm (series 2 image 141) disease previously 1.9 x 1.6 cm. No destructive osseous lesion.  Impression: CHEST:  1.  No significant change in small indeterminate anterior epiphrenic lymph nodes. No new thoracic lymphadenopathy.    2. No significant change to further mild increase in size of pulmonary and pleural metastases.    3. Increasing nodular and tree-in-bud opacities in the lingula and development of mild focal consolidation in the left lower lobe, indeterminate and may be infectious/inflammatory nodules or potentially pulmonary metastases.      ABDOMEN AND PELVIS:  1.  Grossly unchanged extensive peritoneal carcinomatosis.    2. No significant change in small residual indeterminate retroperitoneal lymph nodes. No discrete new abdominopelvic lymphadenopathy.    3. Grossly unchanged right anterior abdominal wall metastases.     Finalized by Ancil Boozer, M.D. on 04/27/2021 8:58 AM. Dictated by Ancil Boozer, M.D. on 04/27/2021 8:28 AM.        ASSESSMENT AND PLAN:    1. Metastatic appendiceal carcinoma. Signet ring adenocarcinoma, MSS, with peritoneal carcinomatosis with  pseudomyxoma peritonei.  02/19/17 diagnostic laparoscopy with biopsies of the right diaphragm, right liver, right fallopian tube and left pelvis. 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  Planned for exploratory laparotomy with CRS/HIPEC in combination with a TAH/BSO in 03/2017.  Developed intestinal obstruction and taken to OR for CRS/HIPEC surgery; however multiple adhesions, omental cracking. CRS/HIPEC was aborted.  Immunotherapy on epacadostat?+ sirolimus?trial 12/24/17-05/21/18.  She stopped study drug on 05/21/18 for mitomycin HIPEC trial at [facility] [place].  On 05/28/18 diagnostic laparoscopy, evacuation of mucinous ascites 1.5L, peritoneal biopsy, laparoscopic HIPEC with mitomycin-C in [place] by Dr. Daryl Eastern.  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  Completed second laparoscopic HIPEC on 06/25/18.  Again 1.5L removed.  09/12/18 underwent debulking at [facility] in [place], [place].  Removed 98% by Dr. [name].  Discharged on 09/30/18.  She had L pleural effusion requiring chest tube drainage; intra-abd abscess 10/2018; discharged on 11/04/18 with drain and IV cefepime and Diflucan. Her ureteral stents were removed and ostomy relocated in 10/2018.  Hospitalized in 01/2019 for AKI 2/2 dehydration and likely PE (V/Q scan and trop elevation). CEA increasing again. Repeat CT CAP 03/20/19 with stable to slight increase in cancer and RUL nodule.  Discussed observation vs treatment. She chose observation and is considering repeat CRS + HIPEC via Dr. [name] in [place].  CT scans from 09/24/19 of the abdomen, without contrast, appear stable.  The peritoneal thickening, carcinomatosis, retroperitoneal lymph nodes, a small amount of fluid collection or trace ascites, appear to be stable compared to 3 months ago. S/p RT to R  lung met on 05/21/20.  CT scan on 08/17/20 showed a right lung mass that is slightly increased from 11 mm to 14 mm. There are some new lung nodules that have developed, a small one in the left lung, a new nodule in the right lung. Inside the abdomen we are seeing continued increase in size of the peritoneal nodules. The largest measuring 14 mm compared to 12 mm.  The nodule lower in the pelvis increased from 16 mm to 21 mm.  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  TMB = 1.3.  S/p CRS w/ ostomy revision and tumor removal around stoma on 10/14/20.  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 [name] PA from [facility] in [place] called to relay their TB recommendations for patient.  Recommendations to proceed with FOLFIRI/Avastin with holding Avastin 6-8 weeks post-op. They also request if our office can repeat genomic testing.  Repeat TEMPUS from 10/14/20.  Pre-screen for KRAS G12D inhibitor trial, if not eligible will plan for FOLFIRI +/- bevacizumab. Tolerated C1 FOLFIRI but more diarrhea and N/V after C2. Decreased irinotecan to 130 mg/m2 due to diarrhea. S/p C4, with bevacizumab added on. CT scan 02/16/21 abdominal wall spots are under control and stable. There is a slight increase in size of the lung nodules. The radiologist is measuring 10 mm to 8 mm in 11/2020. The other right lung nodule is more or less the same at 15 mm. CT scans with stable disease, mild increase in size of pulmonary metastases (specifically the right upper lobe nodule 2.2cm, previously 1.5cm). There is also some increasing nodular and tree-in-bud opacities in the lingula and development of mild focal consolidation in the left lower lobe, indeterminate but may be related to recent COVID infection vs pulmonary metastases. Given overall stability and down-trending CEA, we will plan to continue with the current treatment regimen for now.  Dr. Dorita Fray with Radiation Oncology reviewed imaging (already received 50Gy to right lung 3/1-05/25/20) and will continue to monitor.     Plan 07/20/21:  Proceed with irinotecan, 5-FU and avastin today C13, keeping reduced dose of irinotecan 110 mg/m2. Continue pump unhook and IVF in [place] as before.   Magnesium 1 gm IV today. Continue oral replacement, IV replacement at home locally as needed.   Hold tylenol. Continue cipro 250 mg every other day. Ok to proceed with elevated LFTs. Reassess in 2 weeks.   Repeat imaging 5/9, prior to C14  RTC in 2 weeks with labs and imaging to see Dr. Josiah Lobo  Continue supportive measures.     2. Elevated creatinine, s/p ureteral stent. Her ureteral stents were removed 10/2018. Encouraged hydration. S/p IVF 1L NS and 1g Mg weekly x 4 weeks. Referred to Nephrology as Cr is improving with IVF. She is following with Dr. Marjie Skiff in Nephrology. Cr 2.48 after GI virus. Decreased irinotecan to 130 mg/m2. Continue IV fluids 3 times a week locally, as we anticipate more diarrhea with irinotecan. Creatinine was greater than 4 with infection. Cr 2.09 today. Has seen urology for chronic infections. Is going on chronic antibiotics to control. Now on 250 cipro every other day. Continue IVF closer to home. Proceed with treatment as scheduled with IV fluid support.    3. Diarrhea. Better with Lomotil daily. Continue IV fluids 3 times a week locally. Increased her Lomotil to 2 tablets 4 times daily. She will continue to take Metamucil to  bulk up her stools. She had a day of decreased stool output but was able to flush the Metamucil out with an enema. Bowels are back to baseline now. Continue to take Imodium as needed, okay to increase to 2 tabs at a time as needed. Better with advanced diet. Obstruction with too much fiber. Better with break in chemo. Monitor. IVF as needed throughout week.     4. Nausea. From chemo. Worse after possible GI virus. Tried Compazine. Resolved now that infections have cleared. Chemo as above, continue antiemetics, call if not controlled. Less with last chemo. Better with break in chemo.     5. Fatigue. Improved now that infections are better. More with on-going chemo. Improved with break. Dose reduce as above. This is helping.     6. Hypomagnesemia. Due to large volume fluid loss. 1.3 today. Replace with 1 gm IV today. Monitor when going in for IVF and replace as needed.     7. Urinary tract infection. Now managed by urology. On suppressive antibiotics cipro 250 mg every other day.     8. Elevated LFTs. May be irinotecan, tylenol, cipro. Ok to proceed with chemo. Hold tylenol. Continue antibiotics. Reassess in 2 weeks with imaging.     Turner Daniels, APRN-NP        The patient and family were allowed to ask questions and voice concerns; these were addressed to the best of our ability. They expressed understanding of what was explained to them, and they agreed with the present plan. RTC in 2 weeks with labs to see a provider. Patient has the phone numbers for the Cancer Center and was instructed on how to contact us with any questions or concerns. My collaborating physician on this case is Dr. Josiah Lobo.

## 2021-07-20 NOTE — Progress Notes
CHEMO NOTE  Verified chemo consent signed and in chart.    Blood return positive via: Port (Single and Accessed)    BSA and dose double checked (agree with orders as written) with: yes, see MAR    Labs/applicable tests checked: CBC, Comprehensive Metabolic Panel (CMP), UA and BP    Chemo regimen: Drug/cycle/day C14D1  bevacizumab-awwb (MVASI) 260.5 mg in sodium chloride 0.9% (NS) 110.42 mL IVPB  irinotecan (CAMPTOSAR) 166.2 mg in dextrose 5% (D5W) 508.31 mL IVPB  fluorouraciL (ADRUCIL) 3,624 mg in sodium chloride 0.9% (NS) 92 mL IV amb pump    Rate verified and armband double check with second RN: yes    Patient education offered and stated understanding. Denies questions at this time.    Patient presents for C14D1 mvasi/ folfiri after being evaluated in clinic. Port positive for blood return and orders reviewed. Patient tolerated treatment. Port positive for blood return and connected to 5FU ambulatory pump; all connections secure and clamps open. Pump running at time of dc. Patient dismissed in stable condition.

## 2021-07-26 ENCOUNTER — Encounter: Admit: 2021-07-26 | Discharge: 2021-07-26 | Payer: MEDICARE

## 2021-07-26 ENCOUNTER — Ambulatory Visit: Admit: 2021-07-26 | Discharge: 2021-07-26 | Payer: MEDICARE

## 2021-07-26 DIAGNOSIS — C181 Malignant neoplasm of appendix: Secondary | ICD-10-CM

## 2021-07-26 DIAGNOSIS — C786 Secondary malignant neoplasm of retroperitoneum and peritoneum: Secondary | ICD-10-CM

## 2021-07-27 ENCOUNTER — Encounter: Admit: 2021-07-27 | Discharge: 2021-07-27 | Payer: MEDICARE

## 2021-07-27 DIAGNOSIS — C181 Malignant neoplasm of appendix: Secondary | ICD-10-CM

## 2021-07-27 DIAGNOSIS — C786 Secondary malignant neoplasm of retroperitoneum and peritoneum: Secondary | ICD-10-CM

## 2021-07-27 NOTE — Progress Notes
Telehealth Visit Note    Date of Service: 07/27/2021    Subjective:           Starlene Seefeldt is a 66 y.o. female.    Amane Mangone has history of peritoneal involvement by low grade appendiceal mucinous neoplasm vs involvement by a metastatic, mucinous adenocarcinoma of unknown primary origin.      She was seen by her PCP on 02/14/2017 with intermittent but recurrent abdominal cramping, bloating and diarrhea. Patient reports 6-7 months ago she began having episodes of abdominal cramping that progressed in severity until it caused her to vomit multiple times until it was dry heaving. This was followed by diarrhea the next morning. Patient continued to have abdominal cramping and bloating with alternating watery diarrhea and constipation.  Upon examination she was noted decreased bowel sounds, mass (multiple firm lesions noted on deep palpation to RLQ, mid upper quadrant, and LLQ, non tender to palpation). She is tender with firm mass noted RUQ, LUQ and LLQ. A CT scan was done on 02/14/2017 that demonstrated large volume of abdominopelvic ascites, omental caking, evidence of pelvic peritoneal  enhancing nodularity suspicious for peritoneal carcinomatosis, and hepatic tumor implantation. Findings are most suspicious  for underling ovarian or gastrointestinal neoplasm.     She was then seen by a General Surgeon and on 02/19/2017 she underwent diagnostic laparoscopy with biopsies of right diaphragm, right liver, right fallopian tube and left pelvis. Pathology showed involvement by mucinous neoplasm with low-grade features in all four biopsy specimens. CA 125 on 02/27/2017 was 90.4 and CEA is 1621.0.    Findings at the time of surgery on 02/19/17 by Dr. Jonah Blue: A total of 1.7 L of fluid was removed and the peritoneal cavity.  It was slightly turbid and hemorrhagic in color.  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  There was bulky disease in the pelvis, scattered non-bulky disease of the right hemidiaphragm and on the capsule liver.  Left hemidiaphragm appear to be spared.  On the parietal peritoneum it was started with areas suspicious for carcinomatosis.  The stomach was observed.  There were no evident masses that could be discerned externally.  There was bulky disease in the pelvis over the bladder and a large, what appeared to be, right tubo-ovarian mass with heavy disposition of tumor in that area.  She had also an area of the right fimbria of the fallopian tube with a large to positive tumor.  A portion of the right fallopian tube and fimbria were removed with ligature device.  Biopsies left pelvis were taken of the left pelvic sidewall.  Biopsies were also taken of the serosal surface of the liver and the right hemidiaphragm.     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 This was low grade mucinous neoplasm which was focally positive for CK7, and negative for PAX-8. Cytology from abdominal fluid was negative for malignancy.     She was taken to the OR on 04/03/2017 for potential cytoreductive surgery and hyperthermic intraperitoneal chemotherapy (CRS and HIPEC). 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  The process involved the entire mesentery of the small and large bowel as well as the root of the mesentery.  Almost all of the peritoneal surfaces had disease.  Unfortunately, the mesenteric disease was infiltrative to an extent where it would have required too much of bowel resection in order to remove the disease. A diverting loop ileostomy was performed to help  alleviate obstructive symptoms. Pathology described mucinous carcinoma with low-grade morphology and occasional high-grade areas (G2 and focal G3 with focal signet ring cells).    She received FOLFOX + Avastin since February-September 2019 under the direction of Dr. Josiah Lobo. She began a clinical trial in Sept 2019 with SIROLIMUS + EPACADOSTAT.    She stopped the study drug on 05/21/18 as she chose to enroll in the Mitomycin HIPEC trial at [facility] and completed her first laparoscopic HIPEC on 05/28/18 and second laparoscopic HIPEC on 06/25/18.     She continued to have significant pelvic discomfort and urinary frequency due to the large pelvic mass. She sought an opinion from Dr. [name] in [place], [place]. On 09/12/2018, she underwent an exp Lap, cytoreductive debulking surgery for metastatic mucinous adenocarcinoma of the appendix, total colectomy, splenectomy, omentectomy, TAH/BSO and HIPEC with Mitomycin-C. She underwent a CC-3 cytoreduction with significant amount of visible and palpable disease remaining.    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    Had debulking of the peristomal tumor in July 2022 by Dr. [name].    Started back on maintenance 5-FU, irinotecan and bevacizumab on 12/14/2020.      Guila presents today for ongoing surgical surveillance. She continues to receive IV fluids three times per week to help with high output ileostomy and kidney function. She reports ongoing fatigue and recurrent UTIs but both of these issues have improved recently with medical management. She had a small wound just to the right of the stoma that she thinks may be related to irritation from an ostomy belt.    CEA   Date Value Ref Range Status   07/20/2021 486.4 (H) <3.0 NG/ML Final   07/06/2021 515.0 (H) <3.0 NG/ML Final   06/22/2021 521.6 (H) <3.0 NG/ML Final   06/08/2021 620.8 (H) <3.0 NG/ML Final   05/25/2021 520.8 (H) <3.0 NG/ML Final   05/11/2021 629.6 (H) <3.0 NG/ML Final   04/27/2021 388.5 (H) <3.0 NG/ML Final       CT chest/abd/pelvis on 07/26/2021:  CHEST:   1. ?No significant change in mild epiphrenic lymphadenopathy. No new   thoracic lymphadenopathy.   2. No significant change to further mild increase in size of pulmonary and   pleural metastases. ABDOMEN AND PELVIS:   1. ?Grossly unchanged extensive peritoneal carcinomatosis.   2. No abdominopelvic lymphadenopathy.   3. Grossly unchanged right anterior abdominal wall metastases.        Review of Systems   Constitutional: Positive for fatigue. Negative for activity change, appetite change, chills, diaphoresis, fever and unexpected weight change.   HENT: Negative for facial swelling, mouth sores, sore throat, trouble swallowing and voice change.    Eyes: Negative.  Negative for visual disturbance.   Respiratory: Negative for cough, chest tightness, shortness of breath and wheezing.    Cardiovascular: Negative.  Negative for chest pain, palpitations and leg swelling.   Gastrointestinal: Positive for diarrhea. Negative for abdominal distention, abdominal pain, anal bleeding, blood in stool, constipation, nausea, rectal pain and vomiting.   Genitourinary: Negative for difficulty urinating, dysuria and hematuria.   Musculoskeletal: Negative.  Negative for arthralgias, back pain, neck pain and neck stiffness.        Ankle sore   Skin: Positive for wound. Negative for color change, pallor and rash.   Neurological: Negative for dizziness, weakness, light-headedness, numbness and headaches.   Hematological: Does not bruise/bleed easily.   Psychiatric/Behavioral: Negative for  agitation. The patient is not nervous/anxious.        .  Objective:         ? cefdinir (OMNICEF) 300 mg capsule Take one capsule by mouth. FOR 10 DAYS   ? Cetirizine (ZYRTEC) 10 mg cap Take one capsule by mouth as Needed.   ? dexAMETHasone (DECADRON) 4 mg tablet 1 tab by mouth with breakfast and afternoon snack on days 2 & 3 following each chemotherapy cycle. Do not take after 6 pm to avoid insomnia.   ? diphenoxylate-atropine (LOMOTIL) 2.5-0.025 mg tablet Take one tablet by mouth four times daily as needed for Diarrhea.   ? esomeprazole DR (NEXIUM) 40 mg capsule Take one capsule by mouth daily. Take on an empty stomach at least 1 hour before or 2 hours after food.   ? famotidine (PEPCID AC) 10 mg tablet Take one tablet by mouth twice daily.   ? loperamide (IMODIUM A-D) 2 mg capsule Take one pill by mouth four times daily as needed   ? LORazepam (ATIVAN) 0.5 mg tablet Take 1-2 tabs by mouth every 6 hrs as needed N/V not controlled by Zofran or Compazine. May also use every 6 hrs as needed anxiety or at bedtime insomnia.   ? magnesium citrate 200 mg tab Take one tablet by mouth daily.   ? MULTIVITAMIN PO Take 1 tablet by mouth daily.   ? ondansetron (ZOFRAN ODT) 8 mg rapid dissolve tablet Dissolve one tablet by mouth every 8 hours as needed for Nausea or Vomiting. Place on tongue to disolve.   ? opium tincture oral solution Take 0.6 mL by mouth every 6 hours.   ? potassium chloride (KLOR-CON SPRINKLE) 10 mEq capsule Take one capsule by mouth twice daily. If potassium < 3.5. Take with a meal and a full glass of water.   ? prochlorperazine maleate (COMPAZINE) 10 mg tablet 1 tablet by mouth every 6 hours as needed for nausea not controlled by Zofran.   ? psyllium husk (METAMUCIL) 0.4 gram cap    ? sodium chloride 0.9% (NS) 0.9 % 1,000 mL with potassium chloride 2 mEq/mL 20 mEq, magnesium sulfate 4 mEq/mL (50 %) 2 g IV infusion Administer 1L NS with K and 2g Mg once weekly via outpatient infusion.           Computed Telehealth Body Mass Index unavailable. Necessary lab results were not found in the last year.    Physical Exam  Vitals reviewed.   Constitutional:       Appearance: She is well-developed.   HENT:      Head: Normocephalic and atraumatic.   Neurological:      Mental Status: She is alert and oriented to person, place, and time.   Psychiatric:         Behavior: Behavior normal.         Thought Content: Thought content normal.         Judgment: Judgment normal.              Assessment and Plan:  Peritoneal carcinomatosis  - Interval imaging and labs reviewed in detail.  - She is doing well with her current symptom management regimen.  - We will remain available for any local surgical needs that may arise.  - Continue medical oncology care with Dr. Josiah Lobo, who manages labs and imaging  - RTC 6 months for continued surveillance    Lilian Kapur, PA-C    Collaborating physician: Loralie Champagne, MD  25 minutes spent on this patient's encounter with counseling and coordination of care taking >50% of the visit.

## 2021-07-28 ENCOUNTER — Encounter: Admit: 2021-07-28 | Discharge: 2021-07-28 | Payer: MEDICARE

## 2021-08-01 ENCOUNTER — Encounter: Admit: 2021-08-01 | Discharge: 2021-08-01 | Payer: MEDICARE

## 2021-08-01 MED ORDER — FLUOROURACIL IV AMB PUMP
2400 mg/m2 | Freq: Once | INTRAVENOUS | 0 refills
Start: 2021-08-01 — End: ?

## 2021-08-01 MED ORDER — ATROPINE 0.4 MG/ML IJ SOLN
0.4 mg | INTRAVENOUS | 0 refills | PRN
Start: 2021-08-01 — End: ?

## 2021-08-01 MED ORDER — PALONOSETRON 0.25 MG/5 ML IV SOLN
.25 mg | Freq: Once | INTRAVENOUS | 0 refills
Start: 2021-08-01 — End: ?

## 2021-08-01 MED ORDER — DEXAMETHASONE 6 MG PO TAB
12 mg | Freq: Once | ORAL | 0 refills
Start: 2021-08-01 — End: ?

## 2021-08-03 ENCOUNTER — Encounter: Admit: 2021-08-03 | Discharge: 2021-08-03 | Payer: MEDICARE

## 2021-08-03 DIAGNOSIS — I454 Nonspecific intraventricular block: Secondary | ICD-10-CM

## 2021-08-03 DIAGNOSIS — C786 Secondary malignant neoplasm of retroperitoneum and peritoneum: Secondary | ICD-10-CM

## 2021-08-03 DIAGNOSIS — C801 Malignant (primary) neoplasm, unspecified: Secondary | ICD-10-CM

## 2021-08-03 DIAGNOSIS — C181 Malignant neoplasm of appendix: Secondary | ICD-10-CM

## 2021-08-03 MED ORDER — DEXAMETHASONE 6 MG PO TAB
12 mg | Freq: Once | ORAL | 0 refills | Status: CP
Start: 2021-08-03 — End: ?
  Administered 2021-08-03: 17:00:00 12 mg via ORAL

## 2021-08-03 MED ORDER — FLUOROURACIL IV AMB PUMP
2400 mg/m2 | Freq: Once | INTRAVENOUS | 0 refills | Status: CP
Start: 2021-08-03 — End: ?
  Administered 2021-08-03 (×3): 3624 mg via INTRAVENOUS

## 2021-08-03 MED ORDER — MAGNESIUM SULFATE IN D5W 1 GRAM/100 ML IV PGBK (CC ONLY)
1 g | Freq: Once | INTRAVENOUS | 0 refills | Status: CP
Start: 2021-08-03 — End: ?
  Administered 2021-08-03: 17:00:00 1 g via INTRAVENOUS

## 2021-08-03 MED ORDER — BEVACIZUMAB-AWWB IVPB
5 mg/kg | Freq: Once | INTRAVENOUS | 0 refills | Status: CP
Start: 2021-08-03 — End: ?
  Administered 2021-08-03 (×2): 260.5 mg via INTRAVENOUS

## 2021-08-03 MED ORDER — PALONOSETRON 0.25 MG/5 ML IV SOLN
.25 mg | Freq: Once | INTRAVENOUS | 0 refills | Status: CP
Start: 2021-08-03 — End: ?
  Administered 2021-08-03: 18:00:00 0.25 mg via INTRAVENOUS

## 2021-08-03 MED ORDER — IRINOTECAN IVPB
110 mg/m2 | Freq: Once | INTRAVENOUS | 0 refills | Status: CP
Start: 2021-08-03 — End: ?
  Administered 2021-08-03 (×3): 166.2 mg via INTRAVENOUS

## 2021-08-03 MED ORDER — SODIUM CHLORIDE 0.9 % IV SOLP
1000 mL | Freq: Once | INTRAVENOUS | 0 refills | Status: CP
Start: 2021-08-03 — End: ?
  Administered 2021-08-03: 17:00:00 1000 mL via INTRAVENOUS

## 2021-08-03 MED ORDER — ATROPINE 0.4 MG/ML IJ SOLN
.4 mg | INTRAVENOUS | 0 refills | Status: AC | PRN
Start: 2021-08-03 — End: ?
  Administered 2021-08-03: 18:00:00 0.4 mg via INTRAVENOUS

## 2021-08-03 NOTE — Progress Notes
CHEMO NOTE  Verified chemo consent signed and in chart.    Blood return positive via: Port (Single)    BSA and dose double checked (agree with orders as written) with: yes     Labs/applicable tests checked: CBC and Comprehensive Metabolic Panel (CMP)    Chemo regimen: Drug/cycle/day    bevacizumab-awwb (MVASI) 260.5 mg in sodium chloride 0.9% (NS) 110.42 mL IVPB    irinotecan (CAMPTOSAR) 166.2 mg in dextrose 5% (D5W) 508.31 mL IVPB     fluorouraciL (ADRUCIL) 3,624 mg in sodium chloride 0.9% (NS) 92 mL IV amb pump    Rate verified and armband double check with second RN: yes    Patient education offered and stated understanding. Denies questions at this time.    Patient arrived to [facility] treatment for C14D1 MVASI,FOLFIRI. Pt seen in clinic today by Dr. Eldred Manges. Pt reports all needs addressed by provider, denies new concerns at this time. PAC accessed in lab, flushes, with positive blood return noted. Parameters met for treatment. Premedications and chemotherapy initiated per treatment plan. MVASI and Irinotecan given per [facility] and completed without issue. Cadd pump connected to PAC by Alfonzo Beers.  Pt left treatment w/o further questions or concerns.

## 2021-08-03 NOTE — Patient Instructions
Dr. Vanita Ingles MD Medical Oncologist specializing in Gastro-Intestinal malignancies   Lianne Moris APRN    Nurse: Rolla Etienne RN BSN and Gaetano Hawthorne RN, BSN. Phone: [phone] Fax: [phone], available Monday-Friday 8:00am- 4:00pm. After your new patient visit, we will be listening into the clinic appointments via Teams on the iPad. This is the best way to make sure we hear your concerns, the provider's recommendations and do the behind the scenes work to give you the best care.      Medication refills: please contact your pharmacy for medication refills, if they do not have any refills on file they will contact the office, make sure they have our correct contact information on file P: [phone], F: [phone]. Please allow 24 to 48 hours for medication refill requests.     MyChart Messages: All mychart messages are answered by the nurses, even if you select to send the message to Dr. Josiah Lobo or Denny Peon.  We often communicate with Dr. Josiah Lobo and Denny Peon on how to answer these messages, but all messages will come from the nurses.  Messages are answered 8:00am-4:00pm Monday-Friday, holidays excluded.    Phone Calls: We are hardly ever sitting at our desks where our phone is located as we are in clinic most days during the week. Please leave [place] a message as we check our messages several times per day.  It is our goal to answer these messages as soon as possible.  Messages are answered from 8:00am-4:00 pm Monday-Friday, holidays excluded.  Please make sure you leave your full name with the spelling, date of birth, reason for your call, and return number when leaving a message.     *For each time you contact us for assistance, we advise that you use one source of communication as we have multiple nurses helping with mychart messages and phone calls*    FMLA/paperwork: please allow 5-7 business days for FMLA/paperwork to be filled out so you can get the leave you need.     General Symptom Management Information:    Diarrhea:  Instructions for over the counter Imodium A-D  Take 2 loperamide (Imodium) with the first diarrhea episode, take 1 tablet with next diarrhea episode.  If diarrhea persists, take 1 loperamide (Imodium) every 2 hours during the day.  If you are still having diarrhea at night ,take 2 loperamind (Imodium) every 4 hours during the night, then go back to taking 1 tablet every 2 hours in the morning.   **Stop taking once 12 hours have passed with no diarrhea**    Constipation:   Over the counter instructions for Senekot and Miralax  Take 2 Senekot-S tablets or one capful of Miralax.  If you don?t have bowel movement after 2-3 days go to step 2.  If you do have a bowel movement, continue to take 1-2 Senekot-S tablets daily or one capful of Miralax daily.  Take 2 Senekot-S tablets or one capful of Miralax twice a day.    If you do not have a bowel movement in 1-2 days move to step 3.  Add two tablespoons of Milk of Magnesia followed by lots of water OR drink half a bottle of Magnesium Citrate, which is available over the counter    If you do not have a bowel movement after following this regimen, please call [phone] or send a MyChart message and we will get back to you as soon as possible for further assistance.     Nausea and Vomiting:  Please follow the regimen  your provider has given you and take prescriptions as directed.  If you are still experiencing these symptoms after following your regimen, please page [place] at the number above.      Use the following medications after each dose of chemo:  Prescription instructions following chemotherapy for Nausea and Vomiting    Decadron (dexamethasone): 4mg  tab. Take 1 tablet by mouth 2 times a day with meals on days 2-3 following chemotherapy (breakfast and after lunch). Do not take after 6pm to avoid trouble sleeping.    Zyprexa (Olanzapine): 5-10 mg tab. Take 1 tablet by mouth at bedtime days 1-4 of each chemo cycle  Zofran (ondansetron): 8 mg tab. Take 1 tab every 8 hours as needed for nausea and vomiting.  Compazine (prochlorperazine): 10 mg tab. 1 tablet by mouth every 6 hours as needed for nausea not controlled by Zofran.  Ativan (lorazepam): 0.5mg  tab. Take 1 tab by mouth every 6 hours as needed for nausea and vomiting not relieved by Zofran or compazine.  May also use every 6 hours for insomnia and anxiety. May also use one tab at bedtime as needed for insomnia.    Pain:  Please follow the regimen your provider has given you and take prescriptions as directed. If your pain is still unrelieved after following your regimen, please contact us. Our goal is for your pain to be controlled.  Since most pain medications are controlled substances, we are unable to call them into the pharmacy. They must be sent electronically by your provider with secure access. This may take several hours or days. It is recommended that you notify us when you will be out of medication 3+ days.    If your symptoms are relieved by utilizing the above regimens or you need urgent attention after business hours, there is no need to call or message.    **Page immediately during business hours to report any of the following, by calling the cancer center operator at [phone] and asking to have Dr. Stephens November nurses paged:**  Temperature of 100.5 or greater  Any signs/symptoms of infections: redness, swelling, warmth or tenderness  Shortness of breath that is new  Swelling of arms or legs  Uncontrolled pain or nausea/vomiting.     **If you are having difficulty breathing, chest pain, change in mental status or have lost consciousness, fallen and sustained an injury, severe pain, proceed to your closest emergency department. If your family is not able to take you, call 911 for an ambulance. Do not call or message and wait for instructions. If life threatening, GO TO THE CLOSEST ED!**    If you or anyone who accompanies you to your appointment have a history of falls or needs extra help getting into the cancer center, please utilize our free valet service at the front entrance of the Cancer Center at [facility] and [place].  We also have transportation assistance including wheelchairs at the front entrance.    Medical Records:  In regards to the transfer of medical records from facility to facility, please contact the Medical Records department at [facility] to have this completed.  Due to patient care needs and coordinating clinic, the nurses are not able to fax clinic notes, labs and imaging results to different providers, but Medical Records would be glad to do that for you.  Please contact Medical Records at [phone], their fax number is [phone].           Thank you for choosing the [facility]  [facility] for your Oncology needs.

## 2021-08-16 ENCOUNTER — Encounter: Admit: 2021-08-16 | Discharge: 2021-08-16 | Payer: MEDICARE

## 2021-08-17 ENCOUNTER — Encounter: Admit: 2021-08-17 | Discharge: 2021-08-17 | Payer: MEDICARE

## 2021-08-17 DIAGNOSIS — C801 Malignant (primary) neoplasm, unspecified: Secondary | ICD-10-CM

## 2021-08-17 DIAGNOSIS — C786 Secondary malignant neoplasm of retroperitoneum and peritoneum: Secondary | ICD-10-CM

## 2021-08-17 DIAGNOSIS — C181 Malignant neoplasm of appendix: Secondary | ICD-10-CM

## 2021-08-17 DIAGNOSIS — I454 Nonspecific intraventricular block: Secondary | ICD-10-CM

## 2021-08-17 MED ORDER — PALONOSETRON 0.25 MG/5 ML IV SOLN
.25 mg | Freq: Once | INTRAVENOUS | 0 refills | Status: CP
Start: 2021-08-17 — End: ?
  Administered 2021-08-17: 19:00:00 0.25 mg via INTRAVENOUS

## 2021-08-17 MED ORDER — DEXAMETHASONE 6 MG PO TAB
12 mg | Freq: Once | ORAL | 0 refills | Status: CP
Start: 2021-08-17 — End: ?
  Administered 2021-08-17: 19:00:00 12 mg via ORAL

## 2021-08-17 MED ORDER — SODIUM CHLORIDE 0.9 % IV SOLP
1000 mL | Freq: Once | INTRAVENOUS | 0 refills | Status: CP
Start: 2021-08-17 — End: ?
  Administered 2021-08-17: 19:00:00 1000 mL via INTRAVENOUS

## 2021-08-17 MED ORDER — BEVACIZUMAB-AWWB IVPB
5 mg/kg | Freq: Once | INTRAVENOUS | 0 refills | Status: CP
Start: 2021-08-17 — End: ?
  Administered 2021-08-17 (×2): 260.5 mg via INTRAVENOUS

## 2021-08-17 MED ORDER — ATROPINE 0.4 MG/ML IJ SOLN
.4 mg | INTRAVENOUS | 0 refills | Status: AC | PRN
Start: 2021-08-17 — End: ?
  Administered 2021-08-17: 20:00:00 0.4 mg via INTRAVENOUS

## 2021-08-17 MED ORDER — MAGNESIUM SULFATE IN D5W 1 GRAM/100 ML IV PGBK (CC ONLY)
1 g | INTRAVENOUS | 0 refills | Status: CP
Start: 2021-08-17 — End: ?
  Administered 2021-08-17 (×2): 1 g via INTRAVENOUS

## 2021-08-17 MED ORDER — IRINOTECAN IVPB
110 mg/m2 | Freq: Once | INTRAVENOUS | 0 refills | Status: CP
Start: 2021-08-17 — End: ?
  Administered 2021-08-17 (×3): 166.2 mg via INTRAVENOUS

## 2021-08-17 MED ORDER — FLUOROURACIL IV AMB PUMP
2400 mg/m2 | Freq: Once | INTRAVENOUS | 0 refills | Status: CP
Start: 2021-08-17 — End: ?
  Administered 2021-08-17 (×3): 3624 mg via INTRAVENOUS

## 2021-08-17 MED ORDER — MAGNESIUM OXIDE 400 MG MAGNESIUM PO TAB
400 mg | ORAL_TABLET | Freq: Two times a day (BID) | ORAL | 11 refills | Status: AC
Start: 2021-08-17 — End: ?

## 2021-08-17 NOTE — Progress Notes
CHEMO NOTE  Verified chemo consent signed and in chart.    Blood return positive via: Port (Single, Power Port and Accessed)    BSA and dose double checked (agree with orders as written) with: yes see eMAR    Labs/applicable tests checked: CBC and Comprehensive Metabolic Panel (CMP)    Chemo regimen: C15D1  fluorouraciL (ADRUCIL) 3,624 mg in sodium chloride 0.9% (NS) 92 mL IV amb pump     irinotecan (CAMPTOSAR) 166.2 mg in dextrose 5% (D5W) 508.31 mL IVPB     bevacizumab-awwb (MVASI) 260.5 mg in sodium chloride 0.9% (NS) 110.42 mL IVPB       Rate verified and armband double check with second RN: yes    Patient education offered and stated understanding. Denies questions at this time.    Patient arrived to [place] treatment after being seen in clinic; please refer to clinic note for assessment details. Premeds given per treatment plan. Avastin and Irinotecan given w/o complications, patient tolerated well. Port positive for blood return, flushed with saline, and is currently infusing 5FU via ambulatory pump. All connections secured, tubing unclamped, and face of pump reads infusing. Patient declined copy of labs and AVS. All questions and concerns addressed. Patient left [facility] treatment in stable condition.

## 2021-08-17 NOTE — Progress Notes
Date of Service: 08/17/2021    SUBJECTIVE:             Reason for Visit:  Cancer    Vicki Buchanan is a 66 y.o. 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  02/19/17 diagnostic laparoscopy with biopsies of the right diaphragm, right liver, right fallopian tube and left pelvis. 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  Planned for exploratory laparotomy with CRS/HIPEC in combination with a TAH/BSO in 03/2017.  Developed intestinal obstruction and taken to OR for CRS/HIPEC surgery; however multiple adhesions, omental cracking. CRS/HIPEC was aborted.  Immunotherapy on epacadostat?+ sirolimus?trial 12/24/17-05/21/18.  She stopped study drug on 05/21/18 for mitomycin HIPEC trial at [facility] [place].  On 05/28/18 diagnostic laparoscopy, evacuation of mucinous ascites 1.5L, peritoneal biopsy, laparoscopic HIPEC with mitomycin-C in [place] by Dr. Daryl Eastern.  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  Completed second laparoscopic HIPEC on 06/25/18.  Again 1.5L removed.  09/12/18 underwent debulking at [facility] in [place], [place].  Removed 98% by Dr. [name].  Discharged on 09/30/18.  She had L pleural effusion requiring chest tube drainage; intra-abd abscess 10/2018; discharged on 11/04/18 with drain and IV cefepime and Diflucan. Her ureteral stents were removed and ostomy relocated in 10/2018.  Hospitalized in 01/2019 for AKI 2/2 dehydration and likely PE (V/Q scan and trop elevation). CEA increasing again. Repeat CT CAP 03/20/19 with stable to slight increase in cancer and RUL nodule.  Discussed observation vs treatment. She chose observation and is considering repeat CRS + HIPEC via Dr. [name] in [place].  CT scans from 09/24/19 of the abdomen, without contrast, appear stable.  The peritoneal thickening, carcinomatosis, retroperitoneal lymph nodes, a small amount of fluid collection or trace ascites, appear to be stable compared to 3 months ago. 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  CT scan on 08/17/20 showed a right lung mass that is slightly increased from 11 mm to 14 mm. There are some new lung nodules that have developed, a small one in the left lung, a new nodule in the right lung. Inside the abdomen we are seeing continued increase in size of the peritoneal nodules. The largest measuring 14 mm compared to 12 mm.  The nodule lower in the pelvis increased from 16 mm to 21 mm.  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  TMB = 1.3.  S/p CRS w/ ostomy revision and tumor removal around stoma on 10/14/20.  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 [name] PA from [facility] in [place] called to relay their TB recommendations for patient.  Recommendations to proceed with FOLFIRI/Avastin with holding Avastin 6-8 weeks post-op. They also request if our office can repeat genomic testing.  Repeat TEMPUS from 10/14/20.  Pre-screen for KRAS G12D inhibitor trial, if not eligible will plan for FOLFIRI +/- bevacizumab.      Interval Hx 08/17/21:    She presents for follow-up. She is doing well. She continues to tolerate chemotherapy with dose reduced irinotecan. She has noticed less diarrhea and less fatigue. She also has not had any more N/V sine starting maintenance Cipro 250 mg every other day. She is less fatigued. She has her diarrhea a little bit under control. She talked to Dr. Ihor Austin after her last scan and he told her she does not have any  worries. She quit taking Tylenol. Her ankle is feeling better with a little neuropathy and improving function.        Review of Systems   Constitutional: Positive for fatigue. Negative for activity change, appetite change, chills, diaphoresis, fever and unexpected weight change.   HENT: Negative for facial swelling, mouth sores, sore throat, trouble swallowing and voice change.    Eyes: Negative.  Negative for visual disturbance.   Respiratory: Negative for cough, chest tightness, shortness of breath and wheezing.    Cardiovascular: Negative.  Negative for chest pain, palpitations and leg swelling.   Gastrointestinal: Positive for diarrhea. Negative for abdominal distention, abdominal pain, anal bleeding, blood in stool, constipation, nausea, rectal pain and vomiting.   Genitourinary: Negative for difficulty urinating, dysuria and hematuria.   Musculoskeletal: Negative.  Negative for arthralgias, back pain, neck pain and neck stiffness.   Skin: Negative for color change, pallor, rash and wound.   Neurological: Negative for dizziness, weakness, light-headedness, numbness and headaches.   Hematological: Does not bruise/bleed easily.   Psychiatric/Behavioral: Negative for agitation. The patient is not nervous/anxious.        Medical History:   Diagnosis Date   ? Bundle branch block    ? Cancer [facility])    ? Cancer of appendix [facility])      Surgical History:   Procedure Laterality Date   ? CESAREAN SECTION  1981   ? HX CHOLECYSTECTOMY  1995   ? LAPAROSCOPY  02/2017    biopsy peritoneal   ? COLONOSCOPY DIAGNOSTIC WITH SPECIMEN COLLECTION BY BRUSHING/ WASHING - FLEXIBLE N/A 03/19/2017    Performed by Everardo All, MD at [facility]   ? COLONOSCOPY WITH BIOPSY - FLEXIBLE  03/19/2017    Performed by Everardo All, MD at [facility] ENDO   ? EXPLORATORY LAPAROTOMY, DIVERTING LOOP ILEOSTOMY N/A 04/03/2017    Performed by Loralie Champagne, MD at [facility] OR   ? INSERTION TUNNELED CENTRAL VENOUS CATHETER - AGE 74 YEARS AND OVER Left 04/23/2017    Performed by Loralie Champagne, MD at [facility] OR   ? CYSTOURETHROSCOPY WITH INDWELLING URETERAL STENT INSERTION Bilateral 04/09/2018    Performed by Earl Many, MD at [facility] OR   ? CYSTOURETHROSCOPY WITH URETERAL CATHETERIZATION WITH/ WITHOUT IRRIGATION/ INSTILLATION/ URETEROPYELOGRAPHY Bilateral 04/09/2018    Performed by Earl Many, MD at [facility] OR ? CYSTOURETHROSCOPY WITH INDWELLING URETERAL STENT EXCHANGE (RIGHT 6 x 26cm / LEFT 6 x 28cm) Bilateral 08/08/2018    Performed by Earl Many, MD at [facility] OR   ? RETROGRADE UROGRAPHY WITH/ WITHOUT KUB Bilateral 08/08/2018    Performed by Earl Many, MD at [facility] OR   ? ILEOSTOMY OR JEJUNOSTOMY Right    ? TUNNELED VENOUS PORT PLACEMENT Left    ? URETER STENT PLACEMENT Bilateral      Family History   Problem Relation Age of Onset   ? Diabetes Mother    ? Cancer Father    ? Diabetes Brother    ? Cancer-Lung Paternal Aunt    ? Cancer-Breast Maternal Grandmother    ? Diabetes Paternal Grandmother      Social History     Socioeconomic History   ? Marital status: Married   Tobacco Use   ? Smoking status: Never   ? Smokeless tobacco: Never   Vaping Use   ? Vaping Use: Never used   Substance and Sexual Activity   ? Alcohol use: No     Comment: rarely   ? Drug use: No  OBJECTIVE:         ? cefdinir (OMNICEF) 300 mg capsule Take one capsule by mouth. FOR 10 DAYS   ? Cetirizine (ZYRTEC) 10 mg cap Take one capsule by mouth as Needed.   ? ciprofloxacin (CIPRO) 250 mg tablet Take one tablet by mouth every 48 hours.   ? dexAMETHasone (DECADRON) 4 mg tablet 1 tab by mouth with breakfast and afternoon snack on days 2 & 3 following each chemotherapy cycle. Do not take after 6 pm to avoid insomnia.   ? diphenoxylate-atropine (LOMOTIL) 2.5-0.025 mg tablet Take one tablet by mouth four times daily as needed for Diarrhea.   ? esomeprazole DR (NEXIUM) 40 mg capsule Take one capsule by mouth daily. Take on an empty stomach at least 1 hour before or 2 hours after food.   ? famotidine (PEPCID AC) 10 mg tablet Take one tablet by mouth twice daily.   ? loperamide (IMODIUM A-D) 2 mg capsule Take one pill by mouth four times daily as needed   ? LORazepam (ATIVAN) 0.5 mg tablet Take 1-2 tabs by mouth every 6 hrs as needed N/V not controlled by Zofran or Compazine. May also use every 6 hrs as needed anxiety or at bedtime insomnia.   ? magnesium citrate 200 mg tab Take one tablet by mouth daily.   ? MULTIVITAMIN PO Take 1 tablet by mouth daily.   ? ondansetron (ZOFRAN ODT) 8 mg rapid dissolve tablet Dissolve one tablet by mouth every 8 hours as needed for Nausea or Vomiting. Place on tongue to disolve.   ? opium tincture oral solution Take 0.6 mL by mouth every 6 hours.   ? potassium chloride (KLOR-CON SPRINKLE) 10 mEq capsule Take one capsule by mouth twice daily. If potassium < 3.5. Take with a meal and a full glass of water.   ? prochlorperazine maleate (COMPAZINE) 10 mg tablet 1 tablet by mouth every 6 hours as needed for nausea not controlled by Zofran.   ? psyllium husk (METAMUCIL) 0.4 gram cap    ? sodium chloride 0.9% (NS) 0.9 % 1,000 mL with potassium chloride 2 mEq/mL 20 mEq, magnesium sulfate 4 mEq/mL (50 %) 2 g IV infusion Administer 1L NS with K and 2g Mg once weekly via outpatient infusion.     Vitals:    08/17/21 1215   BP: (!) 142/72   BP Source: Arm, Right Upper   Pulse: 66   Temp: 36.9 ?C (98.4 ?F)   Resp: 16   SpO2: 100%   TempSrc: Temporal   Weight: 52.8 kg (116 lb 6.4 oz)     Body mass index is 21.7 kg/m?Marland Kitchen             Pain Addressed:  N/A    Patient Evaluated for a Clinical Trial: Patient not eligible for a treatment trial (including not needing treatment, needs palliative care, in remission).     [place] Cooperative Oncology Group performance status is 1, Restricted in physically strenuous activity but ambulatory and able to carry out work of a light or sedentary nature, e.g., light house work, office work.       Physical Exam  Vitals reviewed.   Constitutional:       General: She is not in acute distress.     Appearance: Normal appearance. She is well-developed. She is not ill-appearing, toxic-appearing or diaphoretic.   HENT:      Head: Normocephalic and atraumatic.      Nose: Nose normal. No rhinorrhea.  Mouth/Throat:      Mouth: Mucous membranes are moist. Mucous membranes are not pale. No oral lesions. Pharynx: Oropharynx is clear. No oropharyngeal exudate or posterior oropharyngeal erythema.      Tonsils: No tonsillar abscesses.   Eyes:      General: No scleral icterus.        Right eye: No discharge.         Left eye: No discharge.      Extraocular Movements: Extraocular movements intact.      Conjunctiva/sclera: Conjunctivae normal.      Pupils: Pupils are equal, round, and reactive to light.   Cardiovascular:      Rate and Rhythm: Normal rate and regular rhythm.      Pulses: Normal pulses.      Heart sounds: Normal heart sounds. No murmur heard.     No gallop.   Pulmonary:      Effort: Pulmonary effort is normal. No respiratory distress.      Breath sounds: Normal breath sounds. No stridor. No wheezing, rhonchi or rales.   Chest:      Chest wall: No tenderness.      Comments: Port site WNL  Abdominal:      General: A surgical scar is present. Bowel sounds are normal. There is no distension.      Palpations: Abdomen is soft. There is no mass.      Tenderness: There is no abdominal tenderness. There is no guarding or rebound.      Hernia: No hernia is present.          Comments: Nodule palpated above stoma.    Musculoskeletal:         General: No swelling, tenderness, deformity or signs of injury. Normal range of motion.      Cervical back: Normal range of motion and neck supple. No rigidity. No muscular tenderness.      Right lower leg: No edema.      Left lower leg: No edema.   Lymphadenopathy:      Cervical: No cervical adenopathy.   Skin:     General: Skin is warm and dry.      Coloration: Skin is not jaundiced or pale.      Findings: No bruising, erythema, lesion or rash.   Neurological:      General: No focal deficit present.      Mental Status: She is alert and oriented to person, place, and time. Mental status is at baseline.      Cranial Nerves: No cranial nerve deficit.      Motor: No weakness.      Coordination: Coordination normal.      Gait: Gait normal.   Psychiatric:         Mood and Affect: Mood normal.         Behavior: Behavior normal.         Thought Content: Thought content normal.         Judgment: Judgment normal.            CBC w/DIFF  CBC with Diff Latest Ref Rng & Units 08/17/2021 08/03/2021 07/20/2021 07/06/2021 06/22/2021   WBC 4.5 - 11.0 K/UL 4.7 6.2 5.9 10.5 4.2(L)   RBC 4.0 - 5.0 M/UL 2.75(L) 2.68(L) 2.84(L) 2.61(L) 2.62(L)   HGB 12.0 - 15.0 GM/DL 1.6(X) 0.9(U) 10.1(L) 9.2(L) 9.1(L)   HCT 36 - 45 % 29.4(L) 28.4(L) 29.9(L) 27.2(L) 26.9(L)   MCV 80 - 100 FL 106.9(H) 105.9(H) 105.2(H) 104.0(H) 102.5(H)  MCH 26 - 34 PG 36.0(H) 35.3(H) 35.6(H) 35.1(H) 34.6(H)   MCHC 32.0 - 36.0 G/DL 16.1 09.6 04.5 40.9 81.1   RDW 11 - 15 % 14.3 15.5(H) 16.5(H) 16.9(H) 16.1(H)   PLT 150 - 400 K/UL 355 343 384 469(H) 496(H)   MPV 7 - 11 FL 8.3 7.8 7.8 7.7 7.7   NEUT 41 - 77 % 40(L) 48 47 59 36(L)   ANC 1.8 - 7.0 K/UL 1.90 3.00 2.80 6.30 1.60(L)   LYMA 24 - 44 % 33 28 30 20(L) 33   ALYM 1.0 - 4.8 K/UL 1.60 1.70 1.80 2.10 1.40   MONA 4 - 12 % 21(H) 19(H) 15(H) 16(H) 27(H)   AMONO 0 - 0.80 K/UL 1.00(H) 1.20(H) 0.90(H) 1.60(H) 1.10(H)   EOSA 0 - 5 % 5 4 7(H) 4 3   AEOS 0 - 0.45 K/UL 0.20 0.30 0.40 0.40 0.10   BASA 0 - 2 % 1 1 1 1 1    ABAS 0 - 0.20 K/UL 0.00 0.00 0.10 0.10 0.00     Comprehensive Metabolic Profile  CMP Latest Ref Rng & Units 08/17/2021 08/03/2021 07/20/2021 07/06/2021 06/22/2021   NA 137 - 147 MMOL/L 140 140 140 139 141   K 3.5 - 5.1 MMOL/L 4.3 4.2 4.5 4.5 3.8   CL 98 - 110 MMOL/L 108 108 105 108 107   CO2 21 - 30 MMOL/L 26 24 27 26 27    GAP 3 - 12 6 8 8 5 7    BUN 7 - 25 MG/DL 24 91(Y) 78(G) 95(A) 18   CR 0.4 - 1.00 MG/DL 2.13(Y) 8.65(H) 8.46(N) 1.91(H) 1.79(H)   GLUX 70 - 100 MG/DL 96 93 629(B) 284(X) 324(M)   CA 8.5 - 10.6 MG/DL 8.6 9.1 9.4 9.2 8.8   TP 6.0 - 8.0 G/DL 7.0 7.1 7.8 7.0 6.6   ALB 3.5 - 5.0 G/DL 3.8 3.9 4.1 3.6 3.5   ALKP 25 - 110 U/L 233(H) 208(H) 310(H) 293(H) 362(H)   ALT 7 - 56 U/L 34 28 139(H) 34 27   TBILI 0.3 - 1.2 MG/DL 0.5 0.6 0.5 0.4 0.5   GFR >60 mL/min - - - - -   GFRAA >60 mL/min - - - - - Tumor Markers  Lab Results   Component Value Date    CEA 456.4 (H) 08/17/2021    CEA 436.3 (H) 08/03/2021    CEA 486.4 (H) 07/20/2021    CEA 515.0 (H) 07/06/2021    CEA 521.6 (H) 06/22/2021    CEA 620.8 (H) 06/08/2021    CEA 520.8 (H) 05/25/2021    CEA 629.6 (H) 05/11/2021    CEA 388.5 (H) 04/27/2021    CEA 983.2 (H) 03/30/2021    CA199 <1 08/17/2021    CA199 <1 08/03/2021    CA199 <1 07/20/2021    CA199 <1 07/06/2021    CA199 <1 06/22/2021    CA199 <1 06/08/2021    CA199 <1 05/25/2021    CA199 <1 05/11/2021    CA199 <1 04/27/2021    CA199 <1 03/30/2021    CA125 41 (H) 01/21/2018    CA125 42 (H) 11/21/2017    CA125 46 (H) 11/05/2017    CA125 41 (H) 10/22/2017    CA125 34 10/08/2017    CA125 34 09/24/2017    CA125 31 09/10/2017    CA125 29 08/27/2017    CA125 34 08/15/2017    CA125 39 (H) 07/30/2017           Imaging:  -  04/27/21 CT CAP without contrast  CHEST:   1. ?No significant change in small indeterminate anterior epiphrenic lymph nodes. No new thoracic lymphadenopathy.   2. No significant change to further mild increase in size of pulmonary and pleural metastases.   3. Increasing nodular and tree-in-bud opacities in the lingula and development of mild focal consolidation in the left lower lobe, indeterminate and may be infectious/inflammatory nodules or potentially pulmonary metastases. ?     ABDOMEN AND PELVIS:   1. ?Grossly unchanged extensive peritoneal carcinomatosis.   2. No significant change in small residual indeterminate retroperitoneal lymph nodes. No discrete new abdominopelvic lymphadenopathy.   3. Grossly unchanged right anterior abdominal wall metastases.     CT CHEST WO CONTRAST  Narrative: CT CHEST, ABDOMEN AND PELVIS    Clinical Indication: Peritoneal carcinomatosis, cancer of appendix.    Technique: Multiple contiguous axial images were obtained through the chest, abdomen and pelvis without IV contrast material. Post processing coronal and sagittal reconstruction images were made from the axial images.      IV contrast: None  Bowel contrast:  None    Comparison: Noncontrast CT chest, abdomen, and pelvis on May 07, 2021.    CHEST FINDINGS:    Evaluation of the mediastinum and hila, including the vasculature and for lymphadenopathy, is limited without the use of IV contrast.    Lower Neck: Unremarkable.    Axilla, Mediastinum and Hila: No significant change in mild right epiphrenic lymphadenopathy (image 2/83). No new thoracic lymphadenopathy.     Heart and Great Vessels: Left subclavian chest port terminates in the SVC. Heart size normal. Mild coronary artery calcifications. Thoracic aorta normal in caliber. Mild calcified atherosclerotic plaque.    Airway, Lungs and Pleura: Minimal scarring and/or atelectasis. Near-complete resolution of the previously noted focal consolidation in the dependent left lower lobe (image 2/68). No pleural effusion. No significant change to slight increase in size of the pulmonary and pleural metastases (for example images 2/31 and 38). Previously measured metastases as follows:    Right upper lobe, 2.4 cm (image 2/19) previously 2.2 cm.  Right lower lobe, 0.9 cm (image 2/45) previously 1 cm.    Chest Wall and Osseous Structures: Lower cervical and mild thoracic spondylosis. No destructive osseous lesion.     ABDOMEN AND PELVIS FINDINGS:    Limited evaluation without the use of IV contrast which includes the viscera and vasculature.    Liver and Biliary system: Liver is normal in size. Persistent scalloping of the hepatic margins from peritoneal metastases. Unchanged right hepatic lobe cyst. No obvious new parenchymal hepatic mass on noncontrast CT. Persistent intrahepatic biliary ductal dilatation, greatest in the left lobe, likely from periportal peritoneal metastases.     Spleen: Splenectomy.    Adrenal Glands and Kidneys: Adrenal glands unremarkable. Several tiny bilateral renal calculi. No hydronephrosis.    Pancreas and Retroperitoneum: Unopacified pancreas is partially obscured due to extensive peritoneal metastases. No retroperitoneal lymphadenopathy.    Aorta and Major Vessels: Normal caliber abdominal aorta. Mild calcified atherosclerotic plaque.     Bowel, Mesentery and Peritoneal space: Distal colonic anastomosis. Omentectomy. Right lower quadrant ostomy. No bowel obstruction. Overall no significant change in extensive peritoneal metastases, many of which are partially calcified.    Pelvis: Hysterectomy. Persistent gas and fluid distention of the vaginal cuff. Urinary bladder is incompletely distended and not well evaluated. No discrete new pelvic lymphadenopathy.    Abdominal wall and Osseous Structures: Midline ventral abdominal incision scar. Grossly unchanged right anterior abdominal wall metastases.  Previously measured subcutaneous right ventral abdominal wall metastasis measures 1.9 x 1.6 cm (image 2/145) previously 1.9 x 1.6 cm. No destructive osseous lesion.  Impression: CHEST:  1.  No significant change in mild epiphrenic lymphadenopathy. No new thoracic lymphadenopathy.    2. No significant change to further mild increase in size of pulmonary and pleural metastases.        ABDOMEN AND PELVIS:  1.  Grossly unchanged extensive peritoneal carcinomatosis.    2. No abdominopelvic lymphadenopathy.    3. Grossly unchanged right anterior abdominal wall metastases.     Finalized by Prince Rome, D.O. on 07/26/2021 9:12 AM. Dictated by Prince Rome, D.O. on 07/26/2021 8:57 AM.  CT ABD/PELV WO CONTRAST  Narrative: CT CHEST, ABDOMEN AND PELVIS    Clinical Indication: Peritoneal carcinomatosis, cancer of appendix.    Technique: Multiple contiguous axial images were obtained through the chest, abdomen and pelvis without IV contrast material. Post processing coronal and sagittal reconstruction images were made from the axial images.      IV contrast: None  Bowel contrast:  None    Comparison: Noncontrast CT chest, abdomen, and pelvis on May 07, 2021.    CHEST FINDINGS:    Evaluation of the mediastinum and hila, including the vasculature and for lymphadenopathy, is limited without the use of IV contrast.    Lower Neck: Unremarkable.    Axilla, Mediastinum and Hila: No significant change in mild right epiphrenic lymphadenopathy (image 2/83). No new thoracic lymphadenopathy.     Heart and Great Vessels: Left subclavian chest port terminates in the SVC. Heart size normal. Mild coronary artery calcifications. Thoracic aorta normal in caliber. Mild calcified atherosclerotic plaque.    Airway, Lungs and Pleura: Minimal scarring and/or atelectasis. Near-complete resolution of the previously noted focal consolidation in the dependent left lower lobe (image 2/68). No pleural effusion. No significant change to slight increase in size of the pulmonary and pleural metastases (for example images 2/31 and 38). Previously measured metastases as follows:    Right upper lobe, 2.4 cm (image 2/19) previously 2.2 cm.  Right lower lobe, 0.9 cm (image 2/45) previously 1 cm.    Chest Wall and Osseous Structures: Lower cervical and mild thoracic spondylosis. No destructive osseous lesion.     ABDOMEN AND PELVIS FINDINGS:    Limited evaluation without the use of IV contrast which includes the viscera and vasculature.    Liver and Biliary system: Liver is normal in size. Persistent scalloping of the hepatic margins from peritoneal metastases. Unchanged right hepatic lobe cyst. No obvious new parenchymal hepatic mass on noncontrast CT. Persistent intrahepatic biliary ductal dilatation, greatest in the left lobe, likely from periportal peritoneal metastases.     Spleen: Splenectomy.    Adrenal Glands and Kidneys: Adrenal glands unremarkable. Several tiny bilateral renal calculi. No hydronephrosis.    Pancreas and Retroperitoneum: Unopacified pancreas is partially obscured due to extensive peritoneal metastases. No retroperitoneal lymphadenopathy.    Aorta and Major Vessels: Normal caliber abdominal aorta. Mild calcified atherosclerotic plaque.     Bowel, Mesentery and Peritoneal space: Distal colonic anastomosis. Omentectomy. Right lower quadrant ostomy. No bowel obstruction. Overall no significant change in extensive peritoneal metastases, many of which are partially calcified.    Pelvis: Hysterectomy. Persistent gas and fluid distention of the vaginal cuff. Urinary bladder is incompletely distended and not well evaluated. No discrete new pelvic lymphadenopathy.    Abdominal wall and Osseous Structures: Midline ventral abdominal incision scar. Grossly unchanged right anterior abdominal wall metastases. Previously measured subcutaneous  right ventral abdominal wall metastasis measures 1.9 x 1.6 cm (image 2/145) previously 1.9 x 1.6 cm. No destructive osseous lesion.  Impression: CHEST:  1.  No significant change in mild epiphrenic lymphadenopathy. No new thoracic lymphadenopathy.    2. No significant change to further mild increase in size of pulmonary and pleural metastases.        ABDOMEN AND PELVIS:  1.  Grossly unchanged extensive peritoneal carcinomatosis.    2. No abdominopelvic lymphadenopathy.    3. Grossly unchanged right anterior abdominal wall metastases.     Finalized by Prince Rome, D.O. on 07/26/2021 9:12 AM. Dictated by Prince Rome, D.O. on 07/26/2021 8:57 AM.        ASSESSMENT AND PLAN:    1. Metastatic appendiceal carcinoma. Signet ring adenocarcinoma, MSS, with peritoneal carcinomatosis with  pseudomyxoma peritonei.  02/19/17 diagnostic laparoscopy with biopsies of the right diaphragm, right liver, right fallopian tube and left pelvis. 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  Planned for exploratory laparotomy with CRS/HIPEC in combination with a TAH/BSO in 03/2017.  Developed intestinal obstruction and taken to OR for CRS/HIPEC surgery; however multiple adhesions, omental cracking. CRS/HIPEC was aborted.  Immunotherapy on epacadostat?+ sirolimus?trial 12/24/17-05/21/18. She stopped study drug on 05/21/18 for mitomycin HIPEC trial at [facility] [place].  On 05/28/18 diagnostic laparoscopy, evacuation of mucinous ascites 1.5L, peritoneal biopsy, laparoscopic HIPEC with mitomycin-C in [place] by Dr. Daryl Eastern.  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  Completed second laparoscopic HIPEC on 06/25/18.  Again 1.5L removed.  09/12/18 underwent debulking at [facility]. [facility] in [place], [place].  Removed 98% by Dr. [name].  Discharged on 09/30/18.  She had L pleural effusion requiring chest tube drainage; intra-abd abscess 10/2018; discharged on 11/04/18 with drain and IV cefepime and Diflucan. Her ureteral stents were removed and ostomy relocated in 10/2018.  Hospitalized in 01/2019 for AKI 2/2 dehydration and likely PE (V/Q scan and trop elevation). CEA increasing again. Repeat CT CAP 03/20/19 with stable to slight increase in cancer and RUL nodule.  Discussed observation vs treatment. She chose observation and is considering repeat CRS + HIPEC via Dr. [name] in [place].  CT scans from 09/24/19 of the abdomen, without contrast, appear stable.  The peritoneal thickening, carcinomatosis, retroperitoneal lymph nodes, a small amount of fluid collection or trace ascites, appear to be stable compared to 3 months ago. 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  CT scan on 08/17/20 showed a right lung mass that is slightly increased from 11 mm to 14 mm. There are some new lung nodules that have developed, a small one in the left lung, a new nodule in the right lung. Inside the abdomen we are seeing continued increase in size of the peritoneal nodules. The largest measuring 14 mm compared to 12 mm.  The nodule lower in the pelvis increased from 16 mm to 21 mm.  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  TMB = 1.3.  S/p CRS w/ ostomy revision and tumor removal around stoma on 10/14/20.  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 [name] PA from [facility] in [place] called to relay their TB recommendations for patient.  Recommendations to proceed with FOLFIRI/Avastin with holding Avastin 6-8 weeks post-op. They also request if our office can repeat genomic testing.  Repeat TEMPUS from 10/14/20.  Pre-screen for KRAS G12D inhibitor trial, if not eligible will plan for  FOLFIRI +/- bevacizumab. Tolerated C1 FOLFIRI but more diarrhea and N/V after C2. Decreased irinotecan to 130 mg/m2 due to diarrhea. S/p C4, with bevacizumab added on. CT scan 02/16/21 abdominal wall spots are under control and stable. There is a slight increase in size of the lung nodules. The radiologist is measuring 10 mm to 8 mm in 11/2020. The other right lung nodule is more or less the same at 15 mm. CT scans with stable disease, mild increase in size of pulmonary metastases (specifically the right upper lobe nodule 2.2cm, previously 1.5cm). There is also some increasing nodular and tree-in-bud opacities in the lingula and development of mild focal consolidation in the left lower lobe, indeterminate but may be related to recent COVID infection vs pulmonary metastases. Given overall stability and down-trending CEA, we will plan to continue with the current treatment regimen for now.  Dr. Dorita Fray with Radiation Oncology reviewed imaging (already received 50Gy to right lung 3/1-05/25/20) and will continue to monitor.   CT scan on 08/03/2021 shows the cancer inside the abdomen and the abdominal wall metastasis is remaining the same compared to the scans done in 04/2021. The lung metastasis appears slightly larger by about 2 mm.      Plan 08/17/21:  Feeling good, tolerating treatment well.   Proceed with C15 FOLFIR and bev, same doses.   We will see her every other cycle.   Repeat imaging in 3 months.   RTC in 4 weeks with labs and visit, then C17  She may want to hold treatment on 7/12 for a vacation. She will let us know.   Continue supportive measures.     2. Elevated creatinine, s/p ureteral stent. Her ureteral stents were removed 10/2018. Encouraged hydration. S/p IVF 1L NS and 1g Mg weekly x 4 weeks. Referred to Nephrology as Cr is improving with IVF. She is following with Dr. Marjie Skiff in Nephrology. Cr 2.48 after GI virus. Decreased irinotecan to 130 mg/m2. Continue IV fluids 3 times a week locally, as we anticipate more diarrhea with irinotecan. Creatinine was greater than 4 with infection. Cr 2.09 today. Has seen urology for chronic infections. Is going on chronic antibiotics to control. Now on 250 cipro every other day. Continue IVF closer to home. Proceed with treatment as scheduled with IV fluid support.    3. Diarrhea. Better with Lomotil daily. Continue IV fluids 3 times a week locally. Increased her Lomotil to 2 tablets 4 times daily. She will continue to take Metamucil to bulk up her stools. She had a day of decreased stool output but was able to flush the Metamucil out with an enema. Bowels are back to baseline now. Continue to take Imodium as needed, okay to increase to 2 tabs at a time as needed. Better with advanced diet. Obstruction with too much fiber. Better with break in chemo. Monitor. IVF as needed throughout week. Improved. Continue Imodium and Lomotil as needed.    4. Nausea. From chemo. Worse after possible GI virus. Tried Compazine. Resolved now that infections have cleared. Chemo as above, continue antiemetics, call if not controlled. Less with last chemo. Better with break in chemo and controlled UTI.     5. Fatigue. Improved now that infections are better. More with on-going chemo. Improved with break. Dose reduce as above. This is helping.     6. Hypomagnesemia. Due to large volume fluid loss. 1.1 today. Replace with 2 gms IV today. Monitor when going in for IVF and replace as needed.  7. Urinary tract infection. Now managed by urology. On suppressive antibiotics cipro 250 mg every other day. Improved. Continue to monitor.    8. Elevated LFTs. May be irinotecan, tylenol, cipro. Ok to proceed with chemo. Hold tylenol. Continue antibiotics. Reassess in 2 weeks with imaging. Now normal.     Turner Daniels, APRN-NP          The patient and family were allowed to ask questions and voice concerns; these were addressed to the best of our ability. They expressed understanding of what was explained to them, and they agreed with the present plan. RTC in 4 weeks with labs to see a provider. Patient has the phone numbers for the Cancer Center and was instructed on how to contact us with any questions or concerns. My collaborating physician on this case is Dr. Josiah Lobo.

## 2021-08-25 ENCOUNTER — Encounter: Admit: 2021-08-25 | Discharge: 2021-08-25 | Payer: MEDICARE

## 2021-08-31 ENCOUNTER — Encounter: Admit: 2021-08-31 | Discharge: 2021-08-31 | Payer: MEDICARE

## 2021-08-31 ENCOUNTER — Ambulatory Visit: Admit: 2021-08-31 | Discharge: 2021-08-31 | Payer: MEDICARE

## 2021-08-31 DIAGNOSIS — C786 Secondary malignant neoplasm of retroperitoneum and peritoneum: Secondary | ICD-10-CM

## 2021-08-31 DIAGNOSIS — C181 Malignant neoplasm of appendix: Secondary | ICD-10-CM

## 2021-08-31 DIAGNOSIS — R748 Abnormal levels of other serum enzymes: Secondary | ICD-10-CM

## 2021-08-31 DIAGNOSIS — C801 Malignant (primary) neoplasm, unspecified: Secondary | ICD-10-CM

## 2021-08-31 DIAGNOSIS — I454 Nonspecific intraventricular block: Secondary | ICD-10-CM

## 2021-08-31 LAB — CBC AND DIFF
ABSOLUTE BASO COUNT: 0 K/UL (ref 0–0.20)
ABSOLUTE EOS COUNT: 0.2 K/UL (ref 0–0.45)
ABSOLUTE LYMPH COUNT: 1.6 K/UL (ref 1.0–4.8)
ABSOLUTE MONO COUNT: 1 K/UL — ABNORMAL HIGH (ref 0–0.80)
ABSOLUTE NEUTROPHIL: 0.2 K/UL — ABNORMAL LOW (ref 1.8–7.0)
BASOPHILS %: 1 % (ref 0–2)
EOSINOPHILS %: 8 % — ABNORMAL HIGH (ref 0–5)
HEMATOCRIT: 29 % — ABNORMAL LOW (ref 36–45)
HEMOGLOBIN: 9.7 g/dL — ABNORMAL LOW (ref 12.0–15.0)
LYMPHOCYTES %: 51 % — ABNORMAL HIGH (ref 24–44)
MCH: 35 pg — ABNORMAL HIGH (ref 26–34)
MCHC: 33 g/dL (ref 32.0–36.0)
MCV: 107 FL — ABNORMAL HIGH (ref 80–100)
MONOCYTES %: 34 % — ABNORMAL HIGH (ref 4–12)
MPV: 8.3 FL (ref 7–11)
NEUTROPHILS %: 6 % — ABNORMAL LOW (ref 41–77)
PLATELET COUNT: 369 K/UL (ref 150–400)
RBC COUNT: 2.7 M/UL — ABNORMAL LOW (ref 4.0–5.0)
RDW: 15 % — ABNORMAL HIGH (ref 11–15)
WBC COUNT: 3 K/UL — ABNORMAL LOW (ref 4.5–11.0)

## 2021-08-31 LAB — URINALYSIS DIPSTICK
GLUCOSE,UA: NEGATIVE U/L — ABNORMAL LOW (ref 7–40)
LEUKOCYTES: NEGATIVE K/UL (ref 0–0.45)
NITRITE: NEGATIVE K/UL (ref 0–0.80)
URINE BILE: NEGATIVE U/L — ABNORMAL HIGH (ref 7–56)
URINE KETONE: NEGATIVE MMOL/L (ref 21–30)
URINE PH: 6 g/dL — ABNORMAL LOW (ref 5.0–8.0)
URINE SPEC GRAVITY: 1 mg/dL — ABNORMAL LOW (ref 1.005–1.030)

## 2021-08-31 LAB — COMPREHENSIVE METABOLIC PANEL
ALBUMIN: 3.4 g/dL — ABNORMAL LOW (ref 3.5–5.0)
ALK PHOSPHATASE: 396 U/L — ABNORMAL HIGH (ref 25–110)
ALT: 92 U/L — ABNORMAL HIGH (ref 7–56)
ANION GAP: 5 (ref 3–12)
AST: 57 U/L — ABNORMAL HIGH (ref 7–40)
BLD UREA NITROGEN: 20 mg/dL (ref 7–25)
CALCIUM: 8.2 mg/dL — ABNORMAL LOW (ref 8.5–10.6)
CHLORIDE: 110 MMOL/L (ref 98–110)
CO2: 21 MMOL/L (ref 21–30)
CREATININE: 1.6 mg/dL — ABNORMAL HIGH (ref 0.4–1.00)
EGFR: 35 mL/min — ABNORMAL LOW (ref >60)
GLUCOSE,PANEL: 136 mg/dL — ABNORMAL HIGH (ref 70–100)
POTASSIUM: 3.8 MMOL/L (ref 3.5–5.1)
SODIUM: 136 MMOL/L — ABNORMAL LOW (ref 137–147)
TOTAL BILIRUBIN: 2.3 mg/dL — ABNORMAL HIGH (ref 0.3–1.2)
TOTAL PROTEIN: 6.3 g/dL (ref 6.0–8.0)

## 2021-08-31 MED ORDER — MAGNESIUM SULFATE IN D5W 1 GRAM/100 ML IV PGBK (CC ONLY)
1 g | Freq: Once | INTRAVENOUS | 0 refills | Status: CP
Start: 2021-08-31 — End: ?
  Administered 2021-08-31: 17:00:00 1 g via INTRAVENOUS

## 2021-08-31 MED ORDER — SODIUM CHLORIDE 0.9 % IV SOLP
1000 mL | Freq: Once | INTRAVENOUS | 0 refills | Status: CP
Start: 2021-08-31 — End: ?
  Administered 2021-08-31: 17:00:00 1000 mL via INTRAVENOUS

## 2021-08-31 NOTE — Patient Instructions
[place]  Chemotherapy Instructions    Vicki [place] 08/31/2021    Chemotherapy Drugs:      none    Scheduled Medications:    IV fluids and IV Magnesium  Other:     As Needed Medications:      Other:     Call Immediately to report the following:  Unexplained bleeding or bleeding that will not stop  Difficulty swallowing  Shortness of breath, wheezing, or trouble breathing  Rapid, irregular heartbeat; chest pain  Dizziness, lightheadedness  Rash or cut that swells or turns red, feels hot or painful, or begin to ooze  Diarrhea   Uncontrolled nausea or vomiting  Fever of 100.4 F or higher, or chills    Important Phone Numbers:  [place] Number (answered 24 hours a day) [place] (appointments) [phone]  Social Worker [phone]

## 2021-08-31 NOTE — Progress Notes
Date of Service: 08/31/2021    SUBJECTIVE:             Reason for Visit:  No chief complaint on file.    Vicki Buchanan is a 66 y.o. 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  02/19/17 diagnostic laparoscopy with biopsies of the right diaphragm, right liver, right fallopian tube and left pelvis. 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  Planned for exploratory laparotomy with CRS/HIPEC in combination with a TAH/BSO in 03/2017.  Developed intestinal obstruction and taken to OR for CRS/HIPEC surgery; however multiple adhesions, omental cracking. CRS/HIPEC was aborted.  Immunotherapy on epacadostat?+ sirolimus?trial 12/24/17-05/21/18.  She stopped study drug on 05/21/18 for mitomycin HIPEC trial at [facility] [place].  On 05/28/18 diagnostic laparoscopy, evacuation of mucinous ascites 1.5L, peritoneal biopsy, laparoscopic HIPEC with mitomycin-C in [place] by Dr. Daryl Eastern.  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  Completed second laparoscopic HIPEC on 06/25/18.  Again 1.5L removed.  09/12/18 underwent debulking at [facility] in [place], [place].  Removed 98% by Dr. [name].  Discharged on 09/30/18.  She had L pleural effusion requiring chest tube drainage; intra-abd abscess 10/2018; discharged on 11/04/18 with drain and IV cefepime and Diflucan. Her ureteral stents were removed and ostomy relocated in 10/2018.  Hospitalized in 01/2019 for AKI 2/2 dehydration and likely PE (V/Q scan and trop elevation). CEA increasing again. Repeat CT CAP 03/20/19 with stable to slight increase in cancer and RUL nodule.  Discussed observation vs treatment. She chose observation and is considering repeat CRS + HIPEC via Dr. [name] in [place].  CT scans from 09/24/19 of the abdomen, without contrast, appear stable.  The peritoneal thickening, carcinomatosis, retroperitoneal lymph nodes, a small amount of fluid collection or trace ascites, appear to be stable compared to 3 months ago. 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  CT scan on 08/17/20 showed a right lung mass that is slightly increased from 11 mm to 14 mm. There are some new lung nodules that have developed, a small one in the left lung, a new nodule in the right lung. Inside the abdomen we are seeing continued increase in size of the peritoneal nodules. The largest measuring 14 mm compared to 12 mm.  The nodule lower in the pelvis increased from 16 mm to 21 mm.  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  TMB = 1.3.  S/p CRS w/ ostomy revision and tumor removal around stoma on 10/14/20.  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 [name] PA from [facility] in [place] called to relay their TB recommendations for patient.  Recommendations to proceed with FOLFIRI/Avastin with holding Avastin 6-8 weeks post-op. They also request if our office can repeat genomic testing.  Repeat TEMPUS from 10/14/20.  Pre-screen for KRAS G12D inhibitor trial, if not eligible will plan for FOLFIRI +/- bevacizumab.      Interval Hx 08/31/21:  Pt presented to treatment today for next dose of FOLFIRI and Avastin. She reported headaches and elevated blood pressure at home. Her labs showed elevated liver enzymes and bilirubin, as well as low white count. She reports feeling good with no new symptoms, other than the occasional headache. This started after having dental work done. She did not have any drilling or cutting but the crown procedure took over 2 hours.  Her diarrhea has been stable. She denies N/V. She continues to get IVF close to home and magnesium as needed. She denies fevers or chills. She has only taken 3 tylenol tabs in 2 weeks. She denies any alcohol. She switched from pepcid to nexium. She continues on maintenance doses of cipro for UTIs.        Review of Systems Constitutional: Positive for fatigue. Negative for activity change, appetite change, chills, diaphoresis, fever and unexpected weight change.   HENT: Negative for facial swelling, mouth sores, sore throat, trouble swallowing and voice change.    Eyes: Negative.  Negative for visual disturbance.   Respiratory: Negative for cough, chest tightness, shortness of breath and wheezing.    Cardiovascular: Negative.  Negative for chest pain, palpitations and leg swelling.   Gastrointestinal: Positive for diarrhea. Negative for abdominal distention, abdominal pain, anal bleeding, blood in stool, constipation, nausea, rectal pain and vomiting.   Genitourinary: Negative for difficulty urinating, dysuria and hematuria.   Musculoskeletal: Negative.  Negative for arthralgias, back pain, neck pain and neck stiffness.   Skin: Negative for color change, pallor, rash and wound.   Neurological: Negative for dizziness, weakness, light-headedness, numbness and headaches.   Hematological: Does not bruise/bleed easily.   Psychiatric/Behavioral: Negative for agitation. The patient is not nervous/anxious.        Medical History:   Diagnosis Date   ? Bundle branch block    ? Cancer [facility])    ? Cancer of appendix [facility])      Surgical History:   Procedure Laterality Date   ? CESAREAN SECTION  1981   ? HX CHOLECYSTECTOMY  1995   ? LAPAROSCOPY  02/2017    biopsy peritoneal   ? COLONOSCOPY DIAGNOSTIC WITH SPECIMEN COLLECTION BY BRUSHING/ WASHING - FLEXIBLE N/A 03/19/2017    Performed by Everardo All, MD at [facility] ENDO   ? COLONOSCOPY WITH BIOPSY - FLEXIBLE  03/19/2017    Performed by Everardo All, MD at [facility] ENDO   ? EXPLORATORY LAPAROTOMY, DIVERTING LOOP ILEOSTOMY N/A 04/03/2017    Performed by Loralie Champagne, MD at [facility] OR   ? INSERTION TUNNELED CENTRAL VENOUS CATHETER - AGE 60 YEARS AND OVER Left 04/23/2017    Performed by Loralie Champagne, MD at [facility] OR   ? CYSTOURETHROSCOPY WITH INDWELLING URETERAL STENT INSERTION Bilateral 04/09/2018 Performed by Earl Many, MD at [facility] OR   ? CYSTOURETHROSCOPY WITH URETERAL CATHETERIZATION WITH/ WITHOUT IRRIGATION/ INSTILLATION/ URETEROPYELOGRAPHY Bilateral 04/09/2018    Performed by Earl Many, MD at [facility] OR   ? CYSTOURETHROSCOPY WITH INDWELLING URETERAL STENT EXCHANGE (RIGHT 6 x 26cm / LEFT 6 x 28cm) Bilateral 08/08/2018    Performed by Earl Many, MD at [facility] OR   ? RETROGRADE UROGRAPHY WITH/ WITHOUT KUB Bilateral 08/08/2018    Performed by Earl Many, MD at [facility] OR   ? ILEOSTOMY OR JEJUNOSTOMY Right    ? TUNNELED VENOUS PORT PLACEMENT Left    ? URETER STENT PLACEMENT Bilateral      Family History   Problem Relation Age of Onset   ? Diabetes Mother    ? Cancer Father    ? Diabetes Brother    ? Cancer-Lung Paternal Aunt    ? Cancer-Breast Maternal Grandmother    ? Diabetes Paternal Grandmother      Social History     Socioeconomic History   ? Marital status: Married   Tobacco Use   ? Smoking status: Never   ? Smokeless tobacco: Never   Vaping Use   ?  Vaping Use: Never used   Substance and Sexual Activity   ? Alcohol use: No     Comment: rarely   ? Drug use: No         OBJECTIVE:         ? cefdinir (OMNICEF) 300 mg capsule Take one capsule by mouth. FOR 10 DAYS   ? Cetirizine (ZYRTEC) 10 mg cap Take one capsule by mouth as Needed.   ? ciprofloxacin (CIPRO) 250 mg tablet Take one tablet by mouth every 48 hours.   ? dexAMETHasone (DECADRON) 4 mg tablet 1 tab by mouth with breakfast and afternoon snack on days 2 & 3 following each chemotherapy cycle. Do not take after 6 pm to avoid insomnia.   ? diphenoxylate-atropine (LOMOTIL) 2.5-0.025 mg tablet Take one tablet by mouth four times daily as needed for Diarrhea.   ? esomeprazole DR (NEXIUM) 40 mg capsule Take one capsule by mouth daily. Take on an empty stomach at least 1 hour before or 2 hours after food.   ? famotidine (PEPCID AC) 10 mg tablet Take one tablet by mouth twice daily.   ? loperamide (IMODIUM A-D) 2 mg capsule Take one pill by mouth four times daily as needed   ? LORazepam (ATIVAN) 0.5 mg tablet Take 1-2 tabs by mouth every 6 hrs as needed N/V not controlled by Zofran or Compazine. May also use every 6 hrs as needed anxiety or at bedtime insomnia.   ? magnesium oxide 400 mg magnesium tablet Take one tablet by mouth twice daily.   ? MULTIVITAMIN PO Take 1 tablet by mouth daily.   ? ondansetron (ZOFRAN ODT) 8 mg rapid dissolve tablet Dissolve one tablet by mouth every 8 hours as needed for Nausea or Vomiting. Place on tongue to disolve.   ? opium tincture oral solution Take 0.6 mL by mouth every 6 hours.   ? potassium chloride (KLOR-CON SPRINKLE) 10 mEq capsule Take one capsule by mouth twice daily. If potassium < 3.5. Take with a meal and a full glass of water.   ? prochlorperazine maleate (COMPAZINE) 10 mg tablet 1 tablet by mouth every 6 hours as needed for nausea not controlled by Zofran.   ? psyllium husk (METAMUCIL) 0.4 gram cap    ? sodium chloride 0.9% (NS) 0.9 % 1,000 mL with potassium chloride 2 mEq/mL 20 mEq, magnesium sulfate 4 mEq/mL (50 %) 2 g IV infusion Administer 1L NS with K and 2g Mg once weekly via outpatient infusion.     There were no vitals filed for this visit.  There is no height or weight on file to calculate BMI.             Pain Addressed:  N/A    Patient Evaluated for a Clinical Trial: Patient not eligible for a treatment trial (including not needing treatment, needs palliative care, in remission).     [place] Cooperative Oncology Group performance status is 1, Restricted in physically strenuous activity but ambulatory and able to carry out work of a light or sedentary nature, e.g., light house work, office work.       Physical Exam  Vitals reviewed.   Constitutional:       General: She is not in acute distress.     Appearance: Normal appearance. She is well-developed. She is not ill-appearing, toxic-appearing or diaphoretic.   HENT:      Head: Normocephalic and atraumatic.      Nose: Nose normal. No rhinorrhea. Mouth/Throat:  Mouth: Mucous membranes are moist. Mucous membranes are not pale. No oral lesions.      Pharynx: Oropharynx is clear. No oropharyngeal exudate or posterior oropharyngeal erythema.      Tonsils: No tonsillar abscesses.   Eyes:      General: No scleral icterus.        Right eye: No discharge.         Left eye: No discharge.      Extraocular Movements: Extraocular movements intact.      Conjunctiva/sclera: Conjunctivae normal.      Pupils: Pupils are equal, round, and reactive to light.   Cardiovascular:      Rate and Rhythm: Normal rate and regular rhythm.      Pulses: Normal pulses.      Heart sounds: Normal heart sounds. No murmur heard.     No gallop.   Pulmonary:      Effort: Pulmonary effort is normal. No respiratory distress.      Breath sounds: Normal breath sounds. No stridor. No wheezing, rhonchi or rales.   Chest:      Chest wall: No tenderness.      Comments: Port site WNL  Abdominal:      General: A surgical scar is present. Bowel sounds are normal. There is no distension.      Palpations: Abdomen is soft. There is no mass.      Tenderness: There is no abdominal tenderness. There is no guarding or rebound.      Hernia: No hernia is present.          Comments: Nodule palpated above stoma.    Musculoskeletal:         General: No swelling, tenderness, deformity or signs of injury. Normal range of motion.      Cervical back: Normal range of motion and neck supple. No rigidity. No muscular tenderness.      Right lower leg: No edema.      Left lower leg: No edema.   Lymphadenopathy:      Cervical: No cervical adenopathy.   Skin:     General: Skin is warm and dry.      Coloration: Skin is not jaundiced or pale.      Findings: No bruising, erythema, lesion or rash.   Neurological:      General: No focal deficit present.      Mental Status: She is alert and oriented to person, place, and time. Mental status is at baseline.      Cranial Nerves: No cranial nerve deficit.      Motor: No weakness. Coordination: Coordination normal.      Gait: Gait normal.   Psychiatric:         Mood and Affect: Mood normal.         Behavior: Behavior normal.         Thought Content: Thought content normal.         Judgment: Judgment normal.            CBC w/DIFF  CBC with Diff Latest Ref Rng & Units 08/31/2021 08/31/2021 08/17/2021 08/03/2021 07/20/2021   WBC 4.5 - 11.0 K/UL 3.0(L) 3.2(L) 4.7 6.2 5.9   RBC 4.0 - 5.0 M/UL 2.71(L) 2.75(L) 2.75(L) 2.68(L) 2.84(L)   HGB 12.0 - 15.0 GM/DL 4.5(W) 0.9(W) 1.1(B) 1.4(N) 10.1(L)   HCT 36 - 45 % 29.0(L) 29.4(L) 29.4(L) 28.4(L) 29.9(L)   MCV 80 - 100 FL 107.1(H) 106.9(H) 106.9(H) 105.9(H) 105.2(H)   MCH 26 - 34 PG  35.7(H) 35.3(H) 36.0(H) 35.3(H) 35.6(H)   MCHC 32.0 - 36.0 G/DL 16.1 09.6 04.5 40.9 81.1   RDW 11 - 15 % 15.1(H) 15.1(H) 14.3 15.5(H) 16.5(H)   PLT 150 - 400 K/UL 369 365 355 343 384   MPV 7 - 11 FL 8.3 8.0 8.3 7.8 7.8   NEUT 41 - 77 % 6(L) - 40(L) 48 47   ANC 1.8 - 7.0 K/UL 0.20(L) - 1.90 3.00 2.80   LYMA 24 - 44 % 51(H) - 33 28 30   ALYM 1.0 - 4.8 K/UL 1.60 - 1.60 1.70 1.80   MONA 4 - 12 % 34(H) - 21(H) 19(H) 15(H)   AMONO 0 - 0.80 K/UL 1.00(H) - 1.00(H) 1.20(H) 0.90(H)   EOSA 0 - 5 % 8(H) - 5 4 7(H)   AEOS 0 - 0.45 K/UL 0.20 - 0.20 0.30 0.40   BASA 0 - 2 % 1 - 1 1 1    ABAS 0 - 0.20 K/UL 0.00 - 0.00 0.00 0.10     Comprehensive Metabolic Profile  CMP Latest Ref Rng & Units 08/31/2021 08/31/2021 08/17/2021 08/03/2021 07/20/2021   NA 137 - 147 MMOL/L 136(L) 136(L) 140 140 140   K 3.5 - 5.1 MMOL/L 3.8 4.5 4.3 4.2 4.5   CL 98 - 110 MMOL/L 110 108 108 108 105   CO2 21 - 30 MMOL/L 21 22 26 24 27    GAP 3 - 12 5 6 6 8 8    BUN 7 - 25 MG/DL 20 22 24  26(H) 43(H)   CR 0.4 - 1.00 MG/DL 9.14(N) 8.29(F) 6.21(H) 1.66(H) 2.07(H)   GLUX 70 - 100 MG/DL 086(V) 784(O) 96 93 962(X)   CA 8.5 - 10.6 MG/DL 8.2(L) 8.9 8.6 9.1 9.4   TP 6.0 - 8.0 G/DL 6.3 6.9 7.0 7.1 7.8   ALB 3.5 - 5.0 G/DL 5.2(W) 3.8 3.8 3.9 4.1   ALKP 25 - 110 U/L 396(H) 423(H) 233(H) 208(H) 310(H)   ALT 7 - 56 U/L 92(H) 106(H) 34 28 139(H) TBILI 0.3 - 1.2 MG/DL 2.3(H) 2.7(H) 0.5 0.6 0.5   GFR >60 mL/min - - - - -   GFRAA >60 mL/min - - - - -     Tumor Markers  Lab Results   Component Value Date    CEA 470.1 (H) 08/31/2021    CEA 456.4 (H) 08/17/2021    CEA 436.3 (H) 08/03/2021    CEA 486.4 (H) 07/20/2021    CEA 515.0 (H) 07/06/2021    CEA 521.6 (H) 06/22/2021    CEA 620.8 (H) 06/08/2021    CEA 520.8 (H) 05/25/2021    CEA 629.6 (H) 05/11/2021    CEA 388.5 (H) 04/27/2021    CA199 <1 08/31/2021    CA199 <1 08/17/2021    CA199 <1 08/03/2021    CA199 <1 07/20/2021    CA199 <1 07/06/2021    CA199 <1 06/22/2021    CA199 <1 06/08/2021    CA199 <1 05/25/2021    CA199 <1 05/11/2021    CA199 <1 04/27/2021    CA125 41 (H) 01/21/2018    CA125 42 (H) 11/21/2017    CA125 46 (H) 11/05/2017    CA125 41 (H) 10/22/2017    CA125 34 10/08/2017    CA125 34 09/24/2017    CA125 31 09/10/2017    CA125 29 08/27/2017    CA125 34 08/15/2017    CA125 39 (H) 07/30/2017           Imaging:  - 04/27/21  CT CAP without contrast  CHEST:   1. ?No significant change in small indeterminate anterior epiphrenic lymph nodes. No new thoracic lymphadenopathy.   2. No significant change to further mild increase in size of pulmonary and pleural metastases.   3. Increasing nodular and tree-in-bud opacities in the lingula and development of mild focal consolidation in the left lower lobe, indeterminate and may be infectious/inflammatory nodules or potentially pulmonary metastases. ?     ABDOMEN AND PELVIS:   1. ?Grossly unchanged extensive peritoneal carcinomatosis.   2. No significant change in small residual indeterminate retroperitoneal lymph nodes. No discrete new abdominopelvic lymphadenopathy.   3. Grossly unchanged right anterior abdominal wall metastases.     US ABDOMEN LIMITED  Narrative: ABDOMINAL ULTRASOUND     CLINICAL INDICATION: Elevated bilirubin and liver enzymes. Appendiceal cancer with peritoneal carcinomatosis, prior HIPEC.    TECHNIQUE: Multiple grayscale and color Doppler ultrasound images were obtained of the abdomen.     COMPARISON: CT CAP 07/26/2021.    FINDINGS:    Liver and Biliary System:  Mildly heterogeneous echotexture, normal size.  Redemonstrated extensive partially calcified peritoneal metastatic disease with scalloping of the hepatic margins. Simple benign right hepatic cyst. No additional intrahepatic mass identified. Moderate intrahepatic bile duct dilatation in both lobes. The common duct measures 0.7 cm at the porta hepatis. The gallbladder is surgically absent.    Pancreas: Visualized portions are unremarkable.      Spleen: Surgically absent.    Peritoneal Space:  No ascites.   Impression: Moderate intrahepatic bile duct dilatation. This may be due to extrahepatic compression by extensive peritoneal metastatic disease.    By my electronic signature, I attest that I have personally reviewed the images for this examination and formulated the interpretations and opinions expressed in this report     Finalized by Judie Petit, M.D. on 08/31/2021 3:43 PM. Dictated by Alinda Dooms, M.D. on 08/31/2021 3:11 PM.        ASSESSMENT AND PLAN:    1. Metastatic appendiceal carcinoma. Signet ring adenocarcinoma, MSS, with peritoneal carcinomatosis with  pseudomyxoma peritonei.  02/19/17 diagnostic laparoscopy with biopsies of the right diaphragm, right liver, right fallopian tube and left pelvis. 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  Planned for exploratory laparotomy with CRS/HIPEC in combination with a TAH/BSO in 03/2017.  Developed intestinal obstruction and taken to OR for CRS/HIPEC surgery; however multiple adhesions, omental cracking. CRS/HIPEC was aborted.  Immunotherapy on epacadostat?+ sirolimus?trial 12/24/17-05/21/18.  She stopped study drug on 05/21/18 for mitomycin HIPEC trial at [facility] [place].  On 05/28/18 diagnostic laparoscopy, evacuation of mucinous ascites 1.5L, peritoneal biopsy, laparoscopic HIPEC with mitomycin-C in [place] by Dr. Daryl Eastern.  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  Completed second laparoscopic HIPEC on 06/25/18.  Again 1.5L removed.  09/12/18 underwent debulking at [facility] in [place], [place].  Removed 98% by Dr. [name].  Discharged on 09/30/18.  She had L pleural effusion requiring chest tube drainage; intra-abd abscess 10/2018; discharged on 11/04/18 with drain and IV cefepime and Diflucan. Her ureteral stents were removed and ostomy relocated in 10/2018.  Hospitalized in 01/2019 for AKI 2/2 dehydration and likely PE (V/Q scan and trop elevation). CEA increasing again. Repeat CT CAP 03/20/19 with stable to slight increase in cancer and RUL nodule.  Discussed observation vs treatment. She chose observation and is considering repeat CRS + HIPEC via Dr. [name] in [place].  CT scans from 09/24/19 of the abdomen, without contrast, appear stable.  The peritoneal thickening, carcinomatosis,  retroperitoneal lymph nodes, a small amount of fluid collection or trace ascites, appear to be stable compared to 3 months ago. 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  CT scan on 08/17/20 showed a right lung mass that is slightly increased from 11 mm to 14 mm. There are some new lung nodules that have developed, a small one in the left lung, a new nodule in the right lung. Inside the abdomen we are seeing continued increase in size of the peritoneal nodules. The largest measuring 14 mm compared to 12 mm.  The nodule lower in the pelvis increased from 16 mm to 21 mm.  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  TMB = 1.3.  S/p CRS w/ ostomy revision and tumor removal around stoma on 10/14/20.  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 [name] PA from [facility] in [place] called to relay their TB recommendations for patient.  Recommendations to proceed with FOLFIRI/Avastin with holding Avastin 6-8 weeks post-op. They also request if our office can repeat genomic testing.  Repeat TEMPUS from 10/14/20.  Pre-screen for KRAS G12D inhibitor trial, if not eligible will plan for FOLFIRI +/- bevacizumab. Tolerated C1 FOLFIRI but more diarrhea and N/V after C2. Decreased irinotecan to 130 mg/m2 due to diarrhea. S/p C4, with bevacizumab added on. CT scan 02/16/21 abdominal wall spots are under control and stable. There is a slight increase in size of the lung nodules. The radiologist is measuring 10 mm to 8 mm in 11/2020. The other right lung nodule is more or less the same at 15 mm. CT scans with stable disease, mild increase in size of pulmonary metastases (specifically the right upper lobe nodule 2.2cm, previously 1.5cm). There is also some increasing nodular and tree-in-bud opacities in the lingula and development of mild focal consolidation in the left lower lobe, indeterminate but may be related to recent COVID infection vs pulmonary metastases. Given overall stability and down-trending CEA, we will plan to continue with the current treatment regimen for now.  Dr. Dorita Fray with Radiation Oncology reviewed imaging (already received 50Gy to right lung 3/1-05/25/20) and will continue to monitor.   CT scan on 08/03/2021 shows the cancer inside the abdomen and the abdominal wall metastasis is remaining the same compared to the scans done in 04/2021. The lung metastasis appears slightly larger by about 2 mm.      Plan 08/31/21:  Feeling good, tolerating treatment well. Abnormal labs, even on repeat. hold treatment today.   Proceed with IVF and magnesium. Defer chemo.   Ultrasound reveals moderate ductal dilatation. Refer to GI for ERCP.   Check labs on Friday at her clinic close to home when she goes in for IVF and magnesium.   We will plan to see her back in 2 weeks as scheduled with labs and ERCP.  Will decide on treatment at that time.    Repeat imaging in 3 months unless further issues with biliary tree/obstruction.  Continue supportive measures.     2. Elevated creatinine, s/p ureteral stent. Her ureteral stents were removed 10/2018. Encouraged hydration. S/p IVF 1L NS and 1g Mg weekly x 4 weeks. Referred to Nephrology as Cr is improving with IVF. She is following with Dr. Marjie Skiff in Nephrology. Cr 2.48 after GI virus. Decreased irinotecan to 130 mg/m2. Continue IV fluids 3 times a week locally, as we anticipate more diarrhea with irinotecan. Creatinine was  greater than 4 with infection. Cr 2.09 today. Has seen urology for chronic infections. Is going on chronic antibiotics to control. Now on 250 cipro every other day. Continue IVF closer to home. 1.88 today prior to fluids, then 1.6. hold chemo for liver function. Continue IVF as needed at home.     3. Diarrhea. Better with Lomotil daily. Continue IV fluids 3 times a week locally. Increased her Lomotil to 2 tablets 4 times daily. She will continue to take Metamucil to bulk up her stools. She had a day of decreased stool output but was able to flush the Metamucil out with an enema. Bowels are back to baseline now. Continue to take Imodium as needed, okay to increase to 2 tabs at a time as needed. Better with advanced diet. Obstruction with too much fiber. Better with break in chemo. Monitor. IVF as needed throughout week. Improved. Continue Imodium and Lomotil as needed. Hold chemo today.     4. Nausea. From chemo. Worse after possible GI virus. Tried Compazine. Resolved now that infections have cleared. Chemo as above, continue antiemetics, call if not controlled. Less with last chemo. Better with break in chemo and controlled UTI.     5. Fatigue. Improved now that infections are better. More with on-going chemo. Improved with break. Dose reduce as above. More fatigue this week.     6. Hypomagnesemia. Due to large volume fluid loss. 1.2 today. Replace with 2 gms IV today. Monitor when going in for IVF and replace as needed.     7. Urinary tract infection. Now managed by urology. On suppressive antibiotics cipro 250 mg every other day. Improved. Continue to monitor.    8. Elevated LFTs. May be irinotecan, tylenol, cipro. Hold tylenol. Continue antibiotics. Improved after holding tylenol. Now elevated again with bilirubin 2.7, AST 66 and ALT 106. Some improvement after IVF but bilirubin still elevated at 2.3. ultrasound of abdomen to assess for biliary obstruction/dilation. This revealed moderate intrahepatic bile duct dilatation. 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 Refer to GI for possible ERCP. Repeat labs this Friday in [place]. Reassess in 2 weeks.    9. Hypertension. Likely from bevacizumab. Elevated today in treatment. Check urine protein, 1+. She will monitor at home at same time every day and bring into clinic. May need to start lisinopril or amlodipine.     Turner Daniels, APRN-NP          The patient and family were allowed to ask questions and voice concerns; these were addressed to the best of our ability. They expressed understanding of what was explained to them, and they agreed with the present plan. RTC in 2 weeks with labs to see a provider. Patient has the phone numbers for the Cancer Center and was instructed on how to contact us with any questions or concerns. My collaborating physician on this case is Dr. Josiah Lobo.

## 2021-08-31 NOTE — Progress Notes
Called pt to let her know per Lanelle Bal, APRN that her US showed her biliary ducts dilated and needs ERCP urgently, pt to call our office for any questions

## 2021-09-01 ENCOUNTER — Encounter: Admit: 2021-09-01 | Discharge: 2021-09-01 | Payer: MEDICARE

## 2021-09-01 MED ORDER — ATROPINE 0.4 MG/ML IJ SOLN
0.4 mg | INTRAVENOUS | 0 refills | PRN
Start: 2021-09-01 — End: ?

## 2021-09-01 MED ORDER — FLUOROURACIL IV AMB PUMP
2400 mg/m2 | Freq: Once | INTRAVENOUS | 0 refills
Start: 2021-09-01 — End: ?

## 2021-09-01 MED ORDER — PALONOSETRON 0.25 MG/5 ML IV SOLN
.25 mg | Freq: Once | INTRAVENOUS | 0 refills
Start: 2021-09-01 — End: ?

## 2021-09-01 MED ORDER — SODIUM CHLORIDE 0.9 % IV SOLP
1000 mL | Freq: Once | INTRAVENOUS | 0 refills
Start: 2021-09-01 — End: ?

## 2021-09-01 MED ORDER — MAGNESIUM SULFATE IN D5W 1 GRAM/100 ML IV PGBK (CC ONLY)
1 g | Freq: Once | INTRAVENOUS | 0 refills
Start: 2021-09-01 — End: ?

## 2021-09-01 MED ORDER — DEXAMETHASONE 6 MG PO TAB
12 mg | Freq: Once | ORAL | 0 refills
Start: 2021-09-01 — End: ?

## 2021-09-06 ENCOUNTER — Encounter: Admit: 2021-09-06 | Discharge: 2021-09-06 | Payer: MEDICARE

## 2021-09-06 DIAGNOSIS — R17 Unspecified jaundice: Secondary | ICD-10-CM

## 2021-09-06 DIAGNOSIS — C181 Malignant neoplasm of appendix: Secondary | ICD-10-CM

## 2021-09-06 DIAGNOSIS — R7989 Other specified abnormal findings of blood chemistry: Secondary | ICD-10-CM

## 2021-09-06 DIAGNOSIS — C786 Secondary malignant neoplasm of retroperitoneum and peritoneum: Secondary | ICD-10-CM

## 2021-09-07 ENCOUNTER — Encounter: Admit: 2021-09-07 | Discharge: 2021-09-07 | Payer: MEDICARE

## 2021-09-07 ENCOUNTER — Ambulatory Visit: Admit: 2021-09-07 | Discharge: 2021-09-07 | Payer: MEDICARE

## 2021-09-07 DIAGNOSIS — C786 Secondary malignant neoplasm of retroperitoneum and peritoneum: Secondary | ICD-10-CM

## 2021-09-07 DIAGNOSIS — R17 Unspecified jaundice: Secondary | ICD-10-CM

## 2021-09-07 DIAGNOSIS — C181 Malignant neoplasm of appendix: Secondary | ICD-10-CM

## 2021-09-07 DIAGNOSIS — R7989 Other specified abnormal findings of blood chemistry: Secondary | ICD-10-CM

## 2021-09-08 ENCOUNTER — Encounter: Admit: 2021-09-08 | Discharge: 2021-09-08 | Payer: MEDICARE

## 2021-09-08 ENCOUNTER — Ambulatory Visit: Admit: 2021-09-08 | Discharge: 2021-09-08 | Payer: MEDICARE

## 2021-09-08 DIAGNOSIS — R7989 Other specified abnormal findings of blood chemistry: Secondary | ICD-10-CM

## 2021-09-08 DIAGNOSIS — I454 Nonspecific intraventricular block: Secondary | ICD-10-CM

## 2021-09-08 DIAGNOSIS — C786 Secondary malignant neoplasm of retroperitoneum and peritoneum: Secondary | ICD-10-CM

## 2021-09-08 DIAGNOSIS — C181 Malignant neoplasm of appendix: Secondary | ICD-10-CM

## 2021-09-08 DIAGNOSIS — C801 Malignant (primary) neoplasm, unspecified: Secondary | ICD-10-CM

## 2021-09-08 NOTE — Progress Notes
You are scheduled for an ERCP on September 16, 2021 with Dr. Allison Quarry must ARRIVE AND CHECK IN at Admissions by 9:30 AM    Your procedure is scheduled to start at: 11:00 AM    Check in to Admissions at:  [facility]  [address]  [place], [place]., [address]       If you need to reschedule or if you have questions call [phone] Option #4. Starting September 12, 2021 it will be option #2.    Procedure questions after 4:45pm, the day before your procedure- All questions about prep and procedure should be answered by the GI Fellow on call. To page the GI Fellow, call the main hospital number at: [phone] and ask the operator to page the GI Fellow on call.     Spoke with patient, verbalized understanding of instructions.    ERCP (ENDOSCOPIC RETROGRADE CHOLANGIOPANCREATOGRAPHY)      ERCP stands for endoscopic retrograde cholangiopancreatography. This procedure is used to view the biliary and pancreatic ducts.  It is used to evaluate diseases that affect the biliary and pancreatic ducts.  It is also used to help find and treat blockages that may be present.      5 Days Prior:  Check with your prescribing physician for instructions about stopping your blood thinner.  Examples of blood thinners are Aleve, Aspirin, Coumadin, Eliquis, Ibuprofen, Naproxen, Plavix, and Xarelto.  Do not give yourself a Lovenox injection the morning of the test. Lovenox injections may be taken as usual through the day before your test.   Contact the office if you have an allergy to contrast dye. Medications will be ordered for you to take at home prior to your procedure. Contrast dye is injected through a catheter to make the duct show up better on the x-rays.   Discuss diabetic medications and insulin with the prescribing physician.      The Day of Your Exam:  Do not eat or drink anything after midnight, the night before your exam. Nothing by mouth. This includes GUM or CANDY.  Chewing tobacco must be stopped  (6) hours before your scheduled procedure.   If you have an early morning test, take ONLY your essential morning medications (heart, blood pressure, seizure, etc.) with a small sip of water. BY 7:00 AM  You will be sedated for the procedure. A responsible adult must drive you home (no Benedetto Goad, taxis, or buses are allowed). If you do not have a driver we will be unable to do the test.  You will be here for (3-4) hours from arrival time.   You will not be able to return to work the same day.    Please bring a list of your current medications and the dosages with you.     The Procedure:  A thin tube (endoscope) is placed into your throat. It is advanced from the throat, through the upper digestive tract, to the common bile duct opening. The endoscope lets the healthcare provider see the common bile and pancreatic ducts on a video screen.  A cut may be made where the common bile duct opens to the duodenum to make it easier to remove stone(s).  An imaging technique that uses x-rays to obtain real-time moving images of internal organs is called fluoroscopy. It is used to watch and guide progress of the procedure.  In some cases, a plastic tube (stent) is placed to hold the ducts open. This stent will be  replaced or left to fall out on its own and be passed in the stool. Please follow up with the office at [phone], to see if a return appointment is required.  The healthcare provider will discuss the results with you after the test. Biopsy results take about (10) business days.

## 2021-09-09 ENCOUNTER — Encounter: Admit: 2021-09-09 | Discharge: 2021-09-09 | Payer: MEDICARE

## 2021-09-09 MED ORDER — AMLODIPINE 10 MG PO TAB
10 mg | ORAL_TABLET | Freq: Every day | ORAL | 3 refills | Status: AC
Start: 2021-09-09 — End: ?

## 2021-09-12 ENCOUNTER — Encounter: Admit: 2021-09-12 | Discharge: 2021-09-12 | Payer: MEDICARE

## 2021-09-12 DIAGNOSIS — C181 Malignant neoplasm of appendix: Secondary | ICD-10-CM

## 2021-09-14 ENCOUNTER — Encounter: Admit: 2021-09-14 | Discharge: 2021-09-14 | Payer: MEDICARE

## 2021-09-14 DIAGNOSIS — C801 Malignant (primary) neoplasm, unspecified: Secondary | ICD-10-CM

## 2021-09-14 DIAGNOSIS — I454 Nonspecific intraventricular block: Secondary | ICD-10-CM

## 2021-09-14 DIAGNOSIS — C181 Malignant neoplasm of appendix: Secondary | ICD-10-CM

## 2021-09-14 NOTE — Patient Instructions
Dr. Vanita Ingles MD Medical Oncologist specializing in Gastro-Intestinal malignancies   Lianne Moris APRN    Nurse: Rolla Etienne RN BSN and Gaetano Hawthorne RN, BSN. Phone: [phone] Fax: [phone], available Monday-Friday 8:00am- 4:00pm. After your new patient visit, we will be listening into the clinic appointments via Teams on the iPad. This is the best way to make sure we hear your concerns, the provider's recommendations and do the behind the scenes work to give you the best care.      Medication refills: please contact your pharmacy for medication refills, if they do not have any refills on file they will contact the office, make sure they have our correct contact information on file P: [phone], F: [phone]. Please allow 24 to 48 hours for medication refill requests.     MyChart Messages: All mychart messages are answered by the nurses, even if you select to send the message to Dr. Josiah Lobo or Denny Peon.  We often communicate with Dr. Josiah Lobo and Denny Peon on how to answer these messages, but all messages will come from the nurses.  Messages are answered 8:00am-4:00pm Monday-Friday, holidays excluded.    Phone Calls: We are hardly ever sitting at our desks where our phone is located as we are in clinic most days during the week. Please leave [place] a message as we check our messages several times per day.  It is our goal to answer these messages as soon as possible.  Messages are answered from 8:00am-4:00 pm Monday-Friday, holidays excluded.  Please make sure you leave your full name with the spelling, date of birth, reason for your call, and return number when leaving a message.     *For each time you contact us for assistance, we advise that you use one source of communication as we have multiple nurses helping with mychart messages and phone calls*    FMLA/paperwork: please allow 5-7 business days for FMLA/paperwork to be filled out so you can get the leave you need.     General Symptom Management Information:    Diarrhea:  Instructions for over the counter Imodium A-D  Take 2 loperamide (Imodium) with the first diarrhea episode, take 1 tablet with next diarrhea episode.  If diarrhea persists, take 1 loperamide (Imodium) every 2 hours during the day.  If you are still having diarrhea at night ,take 2 loperamind (Imodium) every 4 hours during the night, then go back to taking 1 tablet every 2 hours in the morning.   **Stop taking once 12 hours have passed with no diarrhea**    Constipation:   Over the counter instructions for Senekot and Miralax  Take 2 Senekot-S tablets or one capful of Miralax.  If you don?t have bowel movement after 2-3 days go to step 2.  If you do have a bowel movement, continue to take 1-2 Senekot-S tablets daily or one capful of Miralax daily.  Take 2 Senekot-S tablets or one capful of Miralax twice a day.    If you do not have a bowel movement in 1-2 days move to step 3.  Add two tablespoons of Milk of Magnesia followed by lots of water OR drink half a bottle of Magnesium Citrate, which is available over the counter    If you do not have a bowel movement after following this regimen, please call [phone] or send a MyChart message and we will get back to you as soon as possible for further assistance.     Nausea and Vomiting:  Please follow the regimen  your provider has given you and take prescriptions as directed.  If you are still experiencing these symptoms after following your regimen, please page [place] at the number above.      Use the following medications after each dose of chemo:  Prescription instructions following chemotherapy for Nausea and Vomiting    Decadron (dexamethasone): 4mg  tab. Take 1 tablet by mouth 2 times a day with meals on days 2-3 following chemotherapy (breakfast and after lunch). Do not take after 6pm to avoid trouble sleeping.    Zyprexa (Olanzapine): 5-10 mg tab. Take 1 tablet by mouth at bedtime days 1-4 of each chemo cycle  Zofran (ondansetron): 8 mg tab. Take 1 tab every 8 hours as needed for nausea and vomiting.  Compazine (prochlorperazine): 10 mg tab. 1 tablet by mouth every 6 hours as needed for nausea not controlled by Zofran.  Ativan (lorazepam): 0.5mg  tab. Take 1 tab by mouth every 6 hours as needed for nausea and vomiting not relieved by Zofran or compazine.  May also use every 6 hours for insomnia and anxiety. May also use one tab at bedtime as needed for insomnia.    Pain:  Please follow the regimen your provider has given you and take prescriptions as directed. If your pain is still unrelieved after following your regimen, please contact us. Our goal is for your pain to be controlled.  Since most pain medications are controlled substances, we are unable to call them into the pharmacy. They must be sent electronically by your provider with secure access. This may take several hours or days. It is recommended that you notify us when you will be out of medication 3+ days.    If your symptoms are relieved by utilizing the above regimens or you need urgent attention after business hours, there is no need to call or message.    **Page immediately during business hours to report any of the following, by calling the cancer center operator at [phone] and asking to have Dr. Stephens November nurses paged:**  Temperature of 100.5 or greater  Any signs/symptoms of infections: redness, swelling, warmth or tenderness  Shortness of breath that is new  Swelling of arms or legs  Uncontrolled pain or nausea/vomiting.     **If you are having difficulty breathing, chest pain, change in mental status or have lost consciousness, fallen and sustained an injury, severe pain, proceed to your closest emergency department. If your family is not able to take you, call 911 for an ambulance. Do not call or message and wait for instructions. If life threatening, GO TO THE CLOSEST ED!**    If you or anyone who accompanies you to your appointment have a history of falls or needs extra help getting into the cancer center, please utilize our free valet service at the front entrance of the Cancer Center at [facility] and [place].  We also have transportation assistance including wheelchairs at the front entrance.    Medical Records:  In regards to the transfer of medical records from facility to facility, please contact the Medical Records department at [facility] to have this completed.  Due to patient care needs and coordinating clinic, the nurses are not able to fax clinic notes, labs and imaging results to different providers, but Medical Records would be glad to do that for you.  Please contact Medical Records at [phone], their fax number is [phone].           Thank you for choosing the [facility]  of [facility] for your Oncology needs.

## 2021-09-15 ENCOUNTER — Encounter: Admit: 2021-09-15 | Discharge: 2021-09-15 | Payer: MEDICARE

## 2021-09-15 NOTE — Anesthesia Pre-Procedure Evaluation
Anesthesia Pre-Procedure Evaluation    Name: Vicki Buchanan      MRN: [id number]     DOB: [birth year]     Age: 66 y.o.     Sex: female   __________________________________________________________________________     Procedure Date: 09/16/2021   Procedure: Procedure(s):  ENDOSCOPIC RETROGRADE CHOLANGIOPANCREATOGRAPHY     Physical Assessment  Vital Signs (last filed in past 24 hours):         Patient History  Allergies   Allergen Reactions   ? Shrimp SEE COMMENTS     Eye irritation. Namely blood vessel issues in the eyes         Current Medications    Medication Directions   amLODIPine (NORVASC) 10 mg tablet Take one tablet by mouth daily.   Cetirizine (ZYRTEC) 10 mg cap Take one capsule by mouth as Needed.   ciprofloxacin (CIPRO) 250 mg tablet Take one tablet by mouth every 48 hours.   dexAMETHasone (DECADRON) 4 mg tablet 1 tab by mouth with breakfast and afternoon snack on days 2 & 3 following each chemotherapy cycle. Do not take after 6 pm to avoid insomnia.   diphenoxylate-atropine (LOMOTIL) 2.5-0.025 mg tablet Take one tablet by mouth four times daily as needed for Diarrhea.   famotidine (PEPCID AC) 10 mg tablet Take one tablet by mouth twice daily.   loperamide (IMODIUM A-D) 2 mg capsule Take one pill by mouth four times daily as needed   LORazepam (ATIVAN) 0.5 mg tablet Take 1-2 tabs by mouth every 6 hrs as needed N/V not controlled by Zofran or Compazine. May also use every 6 hrs as needed anxiety or at bedtime insomnia.   magnesium oxide 400 mg magnesium tablet Take one tablet by mouth twice daily.   MULTIVITAMIN PO Take 1 tablet by mouth daily.   ondansetron (ZOFRAN ODT) 8 mg rapid dissolve tablet Dissolve one tablet by mouth every 8 hours as needed for Nausea or Vomiting. Place on tongue to disolve.   opium tincture oral solution Take 0.6 mL by mouth every 6 hours.   potassium chloride (KLOR-CON SPRINKLE) 10 mEq capsule Take one capsule by mouth twice daily. If potassium < 3.5. Take with a meal and a full glass of water.   prochlorperazine maleate (COMPAZINE) 10 mg tablet 1 tablet by mouth every 6 hours as needed for nausea not controlled by Zofran.   psyllium husk (METAMUCIL) 0.4 gram cap    sodium chloride 0.9% (NS) 0.9 % 1,000 mL with potassium chloride 2 mEq/mL 20 mEq, magnesium sulfate 4 mEq/mL (50 %) 2 g IV infusion Administer 1L NS with K and 2g Mg once weekly via outpatient infusion.         Review of Systems/Medical History      Patient summary reviewed  Pertinent labs reviewed    PONV Screening: Non-smoker and Female sex  No history of anesthetic complications  No family history of anesthetic complications      Airway         Previous grade 1 view with MAC 3      Pulmonary      Not a current smoker        Cardiovascular - negative        Exercise tolerance: >4 METS      Beta Blocker therapy: No      Beta blockers within 24 hours: n/a      No hypertension,       No valvular problems/murmurs      No  palpitations        GI/Hepatic/Renal           GERD, poorly controlled        Recently dx mucinous adenocarcinoma, 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     Significant ascites requiring paracentesis in 4/20      Neuro/Psych       No seizures      No hx TIA      No CVA      Musculoskeletal         No back pain      Endocrine/Other       No diabetes      No hypothyroidism      No hyperthyroidism      Anemia (on PO iron supplement)      Malignancy (mucinous adenocarcinoma):        04/2017 Port a cath placement    Physical Exam    Airway Findings      Mallampati: I      TM distance: >3 FB      Neck ROM: full      Mouth opening: good      Airway patency: adequate    Dental Findings: Negative        Comments: Natural    Cardiovascular Findings:       Rhythm: regular      Rate: normal    Pulmonary Findings:       Breath sounds clear to auscultation.    Abdominal Findings:         Comments: Significant ascites    Neurological Findings:       Normal mental status    Constitutional findings:       No acute distress Diagnostic Tests  Hematology:   Lab Results   Component Value Date    HGB 9.7 08/31/2021    HCT 29.0 08/31/2021    PLTCT 369 08/31/2021    WBC 3.0 08/31/2021    NEUT 6 08/31/2021    ANC 0.20 08/31/2021    ANC 0.06 08/31/2021    LYMPH 50 08/31/2021    ALC 1.60 08/31/2021    MONA 34 08/31/2021    AMC 1.00 08/31/2021    EOSA 8 08/31/2021    ABC 0.00 08/31/2021    MCV 107.1 08/31/2021    MCH 35.7 08/31/2021    MCHC 33.4 08/31/2021    MPV 8.3 08/31/2021    RDW 15.1 08/31/2021         General Chemistry:   Lab Results   Component Value Date    NA 136 08/31/2021    K 3.8 08/31/2021    CL 110 08/31/2021    CO2 21 08/31/2021    GAP 5 08/31/2021    BUN 20 08/31/2021    CR 1.60 08/31/2021    GLU 136 08/31/2021    CA 8.2 08/31/2021    ALBUMIN 3.4 08/31/2021    MG 1.2 08/31/2021    TOTBILI 2.3 08/31/2021    PO4 3.3 11/26/2018      Coagulation:   Lab Results   Component Value Date    PTT 28.7 07/10/2018    INR 1.3 07/10/2018         Anesthesia Plan    ASA score: 3   Plan: general

## 2021-09-15 NOTE — Telephone Encounter
Pre procedure review  ERCP - Peritoneal carcinomatosis, elevated LFT, jaundice  Referring:  Anup Kasi  Records in notes      Labs 09/02/2021  Total Bilirubin  2.3High R    Alk Phosphatase  396High R    AST (SGOT)  57High R    ALT (SGPT)  92High R        MRI/MRCP 09/07/2021  IMPRESSION   1. Slight apparent increase in diffuse intrahepatic and extrahepatic   biliary ductal dilatation to the level of the porta hepatis where there is   extrinsic compression on the extrahepatic common duct from metastases. No   biliary filling defect to suggest choledocholithiasis or biliary mass.     2. Grossly unchanged extensive peritoneal carcinomatosis and right   abdominal wall metastases.     3. Partially visualized indeterminate lesion in the left sacrum, which may   be a metastasis or hemangioma.

## 2021-09-16 ENCOUNTER — Ambulatory Visit: Admit: 2021-09-16 | Discharge: 2021-09-16 | Payer: MEDICARE

## 2021-09-16 ENCOUNTER — Encounter: Admit: 2021-09-16 | Discharge: 2021-09-16 | Payer: MEDICARE

## 2021-09-16 DIAGNOSIS — C181 Malignant neoplasm of appendix: Secondary | ICD-10-CM

## 2021-09-16 DIAGNOSIS — I454 Nonspecific intraventricular block: Secondary | ICD-10-CM

## 2021-09-16 DIAGNOSIS — C801 Malignant (primary) neoplasm, unspecified: Secondary | ICD-10-CM

## 2021-09-16 MED ORDER — FENTANYL CITRATE (PF) 50 MCG/ML IJ SOLN
INTRAVENOUS | 0 refills | Status: DC
Start: 2021-09-16 — End: 2021-09-16
  Administered 2021-09-16 (×2): 50 ug via INTRAVENOUS

## 2021-09-16 MED ORDER — PHENYLEPHRINE HCL IN 0.9% NACL 1 MG/10 ML (100 MCG/ML) IV SYRG
INTRAVENOUS | 0 refills | Status: DC
Start: 2021-09-16 — End: 2021-09-16
  Administered 2021-09-16: 17:00:00 50 ug via INTRAVENOUS

## 2021-09-16 MED ORDER — CEFTRIAXONE 1 GRAM IJ SOLR
INTRAVENOUS | 0 refills | Status: DC
Start: 2021-09-16 — End: 2021-09-16
  Administered 2021-09-16: 17:00:00 1 g via INTRAVENOUS

## 2021-09-16 MED ORDER — LACTATED RINGERS IV SOLP
INTRAVENOUS | 0 refills | Status: DC
Start: 2021-09-16 — End: 2021-09-16
  Administered 2021-09-16: 16:00:00 via INTRAVENOUS

## 2021-09-16 MED ORDER — ARTIFICIAL TEARS (PF) SINGLE DOSE DROPS GROUP
OPHTHALMIC | 0 refills | Status: DC
Start: 2021-09-16 — End: 2021-09-16
  Administered 2021-09-16: 16:00:00 2 [drp] via OPHTHALMIC

## 2021-09-16 MED ORDER — SUCCINYLCHOLINE CHLORIDE 20 MG/ML IJ SOLN
INTRAVENOUS | 0 refills | Status: DC
Start: 2021-09-16 — End: 2021-09-16
  Administered 2021-09-16: 16:00:00 100 mg via INTRAVENOUS

## 2021-09-16 MED ORDER — LIDOCAINE (PF) 200 MG/10 ML (2 %) IJ SYRG
INTRAVENOUS | 0 refills | Status: DC
Start: 2021-09-16 — End: 2021-09-16
  Administered 2021-09-16: 16:00:00 80 mg via INTRAVENOUS

## 2021-09-16 MED ORDER — ONDANSETRON HCL (PF) 4 MG/2 ML IJ SOLN
INTRAVENOUS | 0 refills | Status: DC
Start: 2021-09-16 — End: 2021-09-16
  Administered 2021-09-16: 16:00:00 4 mg via INTRAVENOUS

## 2021-09-16 MED ORDER — PROPOFOL INJ 10 MG/ML IV VIAL
INTRAVENOUS | 0 refills | Status: DC
Start: 2021-09-16 — End: 2021-09-16
  Administered 2021-09-16: 16:00:00 150 mg via INTRAVENOUS

## 2021-09-16 MED ADMIN — IOHEXOL 300 MG IODINE/ML IV SOLN [79156]: 35 mL | INTRAMUSCULAR | @ 17:00:00 | Stop: 2021-09-16 | NDC 00407141361

## 2021-09-16 MED ADMIN — ONDANSETRON HCL (PF) 4 MG/2 ML IJ SOLN [136012]: 4 mg | INTRAVENOUS | @ 18:00:00 | Stop: 2021-09-16 | NDC 72266012301

## 2021-09-16 MED ADMIN — HYDRALAZINE 20 MG/ML IJ SOLN [3697]: 5 mg | INTRAVENOUS | @ 19:00:00 | Stop: 2021-09-16 | NDC 00517090101

## 2021-09-16 MED ADMIN — SODIUM CHLORIDE 0.9 % IR SOLN [11403]: 35 mL | @ 17:00:00 | Stop: 2021-09-16 | NDC 00338004804

## 2021-09-16 MED ADMIN — HYDRALAZINE 20 MG/ML IJ SOLN [3697]: 5 mg | INTRAVENOUS | @ 18:00:00 | Stop: 2021-09-16 | NDC 00517090101

## 2021-09-16 MED ADMIN — INDOMETHACIN 50 MG RE SUPP [77751]: 2 | RECTAL | @ 17:00:00 | Stop: 2021-09-16 | NDC 69344010233

## 2021-09-17 ENCOUNTER — Encounter: Admit: 2021-09-17 | Discharge: 2021-09-17 | Payer: MEDICARE

## 2021-09-17 DIAGNOSIS — C181 Malignant neoplasm of appendix: Secondary | ICD-10-CM

## 2021-09-17 DIAGNOSIS — I454 Nonspecific intraventricular block: Secondary | ICD-10-CM

## 2021-09-17 DIAGNOSIS — C801 Malignant (primary) neoplasm, unspecified: Secondary | ICD-10-CM

## 2021-09-19 ENCOUNTER — Encounter: Admit: 2021-09-19 | Discharge: 2021-09-19 | Payer: MEDICARE

## 2021-09-19 DIAGNOSIS — C181 Malignant neoplasm of appendix: Secondary | ICD-10-CM

## 2021-09-21 ENCOUNTER — Encounter: Admit: 2021-09-21 | Discharge: 2021-09-21 | Payer: MEDICARE

## 2021-09-22 ENCOUNTER — Encounter: Admit: 2021-09-22 | Discharge: 2021-09-22 | Payer: MEDICARE

## 2021-09-22 DIAGNOSIS — R198 Other specified symptoms and signs involving the digestive system and abdomen: Secondary | ICD-10-CM

## 2021-09-23 ENCOUNTER — Encounter: Admit: 2021-09-23 | Discharge: 2021-09-23 | Payer: MEDICARE

## 2021-09-23 ENCOUNTER — Ambulatory Visit: Admit: 2021-09-23 | Discharge: 2021-09-23 | Payer: MEDICARE

## 2021-09-23 DIAGNOSIS — R198 Other specified symptoms and signs involving the digestive system and abdomen: Secondary | ICD-10-CM

## 2021-09-23 DIAGNOSIS — C181 Malignant neoplasm of appendix: Secondary | ICD-10-CM

## 2021-09-23 DIAGNOSIS — I454 Nonspecific intraventricular block: Secondary | ICD-10-CM

## 2021-09-23 DIAGNOSIS — C801 Malignant (primary) neoplasm, unspecified: Secondary | ICD-10-CM

## 2021-09-23 MED ORDER — ONDANSETRON HCL (PF) 4 MG/2 ML IJ SOLN
4 mg | Freq: Once | INTRAVENOUS | 0 refills | PRN
Start: 2021-09-23 — End: ?

## 2021-09-23 NOTE — Pre-Anesthesia Patient Instructions
Vicki Buchanan,     You are scheduled for your procedure on: 09/26/2021 at  9:00 AM with arrival time of 8:00 am  With Dr. Keenan Bachelor, Rodena Medin, MD      ** Please keep in mind that our surgery schedule is fluid, and affected by multiple factors, so it is possible that your procedure time could change.  We will make every effort to maintain your original procedure time, but there are no guarantees.  If your time changes, you could receive a call notifying you up until the afternoon prior to your procedure. Please check your phone or mychart the afternoon prior to your surgery to ensure you do not have any messages regarding schedule changes. Thank you for your patience and understanding!      **Please note that the Ambulatory Surgery Center doors open at 6am. If you arrive earlier, you will need to wait in your vehicle.     OUR ADDRESS IS [address] [place] [address]  [address] AND TURN LEFT.   TAKE A LEFT AT THE FIRST OR SECOND ENTRANCE AND FOLLOW SIGNAGE TO THE AMBULATORY SURGERY CENTER ([address]).    To Reschedule, please call your surgeon's office  For Pre-Procedure Questions call: PAT Phone #s: [phone]  [facility] main number: [phone] (receptionist, billing, insurance)    To notify us of an AFTER HOURS cancellation (during the weekend or night prior to procedure), please leave a voicemail at [phone] stating that you need to cancel.      ________________________________________________  5DAYS  UNLESS INSTRUCTED OTHERWISE, PLEASE HOLD ALL VITAMINS, SUPPLEMENTS, AND NSAIDS (ASPIRIN, IBUPROFEN, ALEVE, CELEBREX).    YOU MAY CONTINUE TO USE TYLENOL (ACETAMINOPHEN).       Below are the instructions for your upcoming procedure.                                                                                                        PLEASE HAVE NOTHING TO EAT AFTER MIDNIGHT PRIOR TO YOUR PROCEDURE  AVOID SMOKING, GUM, CANDY, AND MINTS AFTER MIDNIGHT PRIOR TO YOUR PROCEDURE  YOU MAY HAVE CLEAR LIQUIDS ONLY UNTIL 5:00 am  *Acceptable Clear Liquids include WATER, TEA, GATORADE, OR BLACK COFFEE NO CREAMER OR ADDITIVES. Consuming liquids other than those listed or closer than four hours prior to your procedure start time may result in cancellation of your procedure.     BATHE, BRUSH TEETH AND GARGLE THE MORNING OF SURGERY, NO MAKEUP, PERFUMES OR LOTION  WEAR CASUAL, COMFORTABLE, LOOSE FITTING CLOTHING THAT ARE EASY TO GET ON AND OFF   TAKE OFF ANY JEWELRY, TAKE OUT ANY PIERCINGS, AND LEAVE ALL OTHER VALUABLES AT HOME  IF YOU WEAR GLASSES OR CONTACTS PLEASE BRING A CASE FOR THEIR SAFEKEEPING    COLON PREP:  Purchase 2 saline enemas over the counter.  Instill per rectum both enemas and retain for several minutes then evacuate approximately 1 hour prior to leaving your home.    You are scheduled to receive anesthesia with your procedure/surgery. For your safety,  and the safety of others, a responsible adult (over the age of 83) is required to drive you home from your procedure and stay with you for 24 hours. We ask that your caregiver remain in or near the facility for the duration of your procedure. If your caregiver does not remain in the facility, we require a working mobile phone number and, in some instances, the facility may contact your driver for pick-up confirmation before your procedure can begin. If you choose to use an alternate method of transportation such as an Pharmacist, community or taxi, a responsible adult is still required to accompany you to and from the facility. If you do not have someone to accompany you to and from our facility, we will be unable to complete your procedure/surgery.    Unless you request otherwise, we will invite your driver to pre-op and to the recovery room to review your post-operative instructions. Please make your driver aware that the temperature of the surgery center is often kept cool and we advise dressing warm or bringing a jacket. BRING CRUTCHES OR RESPIRATORY INHALERS IF APPLICABLE    PLEASE BRING YOUR CELL PHONE WITH YOU TO YOUR PROCEDURE.   BRING INSURANCE CARD AND DRIVERS LICENSE, AND BE PREPARED TO PAY ANY COPAY/DEDUCTIBLE ON ARRIVAL WHEN CHECKING IN     YOUR HOME MEDICATION LIST WILL BE REVIEWED WITH YOU PRIOR TO YOUR PROCEDURE. IF YOU ARE NOT ABLE TO EASILY REVIEW YOUR MEDICATIONS AND DOSAGES BY MEMORY, PLEASE BRING A CURRENT LIST OF THE MEDICATIONS YOU ARE TAKING. PLEASE DO NOT BRING YOUR PILLS OR MEDICATION CONTAINERS WITH YOU TO YOUR PROCEDURE.       Other instructions  Notify your surgeon if:  There is a possibility that you are pregnant  You become ill with a cough, fever, sore throat, nausea, vomiting or flu-like symptoms  You have any open wounds/sores that are red, painful, draining, or are new since you last saw  the doctor  You need to cancel your procedure      ___________________________________________________________________________________________________________    KEEPING YOU SAFE    ** THE HEALTH AND SAFETY OF OUR PATIENTS, STAFF AND PHYSICIANS IS OUR TOP PRIORITY. TO REDUCE THE RISK OF POSSIBLE COVID-19 EXPOSURE WE ARE TAKING THE PROPER PRECAUTIONS LISTED BELOW **    MASKING IS OPTIONAL INSIDE THE SURGERY CENTER. MASKS WILL REMAIN AVAILABLE AT THE ENTRANCE FOR PATIENTS AND VISITORS.     YOU ARE PERMITTED ONE ADULT VISITOR IN THE WAITING AREA (TWO ADULTS MAY ACCOMPANY CHILDREN UNDER AGE 20 WHO ARE HAVING SURGERY). PLEASE ENSURE YOUR VISITOR IS ALSO FREE FROM RECENT ILLNESSES OR EXPOSURE TO ILLNESS.     NO VISITORS UNDER THE AGE OF 18.    NOTIFY YOUR SURGEON IF YOU HAVE ANY SIGNIFICANT HEALTH STATUS CHANGES OR SHOULD YOU BECOME ILL PRIOR TO SURGERY WITH FEVER (TEMP 100.0 FAHRENHEIT OR GREATER) AND/OR COUGH, DIFFICULTY BREATHING, CHILLS, MUSCLE PAIN, HEADACHE, SORE THROAT OR NEW ONSET LOSS OF TASTE OR SMELL, OR NEW SUSPECTED EXPOSURE TO COVID-19.    ________________________________________________________________________________________________________          Pre-Admissions Testing (PAT)  [facility]  [email or web address]  [address]   [place], [place] [address]  [phone] (Main)    PAT Phone #s: [phone] Marjean Donna)  [email or web address]  An Affiliate of [facility]

## 2021-09-24 ENCOUNTER — Encounter: Admit: 2021-09-24 | Discharge: 2021-09-24 | Payer: MEDICARE

## 2021-09-24 NOTE — Telephone Encounter
Pre procedure review  Colonoscopy - diarrhea, rectal pressure  Referring: Anup Kasi  Records in notes

## 2021-09-26 ENCOUNTER — Encounter: Admit: 2021-09-26 | Discharge: 2021-09-26 | Payer: MEDICARE

## 2021-09-26 ENCOUNTER — Ambulatory Visit: Admit: 2021-09-26 | Discharge: 2021-09-26 | Payer: MEDICARE

## 2021-09-26 MED ORDER — LIDOCAINE (PF) 100 MG/5 ML (2 %) IV SYRG
INTRAVENOUS | 0 refills | Status: DC
Start: 2021-09-26 — End: 2021-09-26

## 2021-09-26 MED ORDER — PROPOFOL 10 MG/ML IV EMUL 20 ML (INFUSION)(AM)(OR)
INTRAVENOUS | 0 refills | Status: DC
Start: 2021-09-26 — End: 2021-09-26

## 2021-09-26 MED ORDER — HYOSCYAMINE SULFATE 0.125 MG SL SUBL
125 ug | ORAL_TABLET | Freq: Four times a day (QID) | SUBLINGUAL | 11 refills | Status: AC | PRN
Start: 2021-09-26 — End: ?

## 2021-09-26 NOTE — Telephone Encounter
-----   Message from Threasa Alpha, MD sent at 09/26/2021  9:47 AM CDT -----  Start [place]

## 2021-09-27 ENCOUNTER — Encounter: Admit: 2021-09-27 | Discharge: 2021-09-27 | Payer: MEDICARE

## 2021-09-27 DIAGNOSIS — I454 Nonspecific intraventricular block: Secondary | ICD-10-CM

## 2021-09-27 DIAGNOSIS — C801 Malignant (primary) neoplasm, unspecified: Secondary | ICD-10-CM

## 2021-09-27 DIAGNOSIS — C181 Malignant neoplasm of appendix: Secondary | ICD-10-CM

## 2021-09-28 ENCOUNTER — Encounter: Admit: 2021-09-28 | Discharge: 2021-09-28 | Payer: MEDICARE

## 2021-09-28 ENCOUNTER — Ambulatory Visit: Admit: 2021-09-28 | Discharge: 2021-09-28 | Payer: MEDICARE

## 2021-09-28 ENCOUNTER — Inpatient Hospital Stay: Admit: 2021-09-28 | Payer: MEDICARE

## 2021-09-28 DIAGNOSIS — C181 Malignant neoplasm of appendix: Secondary | ICD-10-CM

## 2021-09-28 DIAGNOSIS — C801 Malignant (primary) neoplasm, unspecified: Secondary | ICD-10-CM

## 2021-09-28 DIAGNOSIS — I454 Nonspecific intraventricular block: Secondary | ICD-10-CM

## 2021-09-28 DIAGNOSIS — C786 Secondary malignant neoplasm of retroperitoneum and peritoneum: Secondary | ICD-10-CM

## 2021-09-28 MED ORDER — PROCHLORPERAZINE MALEATE 10 MG PO TAB
10 mg | ORAL | 0 refills | Status: AC | PRN
Start: 2021-09-28 — End: ?

## 2021-09-28 MED ORDER — POLYETHYLENE GLYCOL 3350 17 GRAM PO PWPK
1 | Freq: Every day | ORAL | 0 refills | Status: AC | PRN
Start: 2021-09-28 — End: ?

## 2021-09-28 MED ORDER — SODIUM CHLORIDE 0.9 % IV SOLP
2000 mL | Freq: Once | INTRAVENOUS | 0 refills | Status: CP
Start: 2021-09-28 — End: ?
  Administered 2021-09-28: 17:00:00 2000 mL via INTRAVENOUS

## 2021-09-28 MED ORDER — PROCHLORPERAZINE EDISYLATE 5 MG/ML IJ SOLN
10 mg | INTRAVENOUS | 0 refills | Status: AC | PRN
Start: 2021-09-28 — End: ?
  Administered 2021-10-02: 11:00:00 10 mg via INTRAVENOUS

## 2021-09-28 MED ORDER — OXYCODONE 5 MG PO TAB
5 mg | ORAL | 0 refills | Status: AC | PRN
Start: 2021-09-28 — End: ?
  Administered 2021-10-01 – 2021-10-02 (×5): 5 mg via ORAL

## 2021-09-28 MED ORDER — HYOSCYAMINE SULFATE 0.125 MG PO TBDI
.125 mg | SUBLINGUAL | 0 refills | Status: AC | PRN
Start: 2021-09-28 — End: ?
  Administered 2021-09-29 – 2021-10-03 (×4): 0.125 mg via SUBLINGUAL

## 2021-09-28 MED ORDER — HEPARIN, PORCINE (PF) 5,000 UNIT/0.5 ML IJ SYRG
5000 [IU] | SUBCUTANEOUS | 0 refills | Status: AC
Start: 2021-09-28 — End: ?
  Administered 2021-09-29 – 2021-10-03 (×6): 5000 [IU] via SUBCUTANEOUS

## 2021-09-28 MED ORDER — FAMOTIDINE 20 MG PO TAB
10 mg | Freq: Two times a day (BID) | ORAL | 0 refills | Status: AC
Start: 2021-09-28 — End: ?
  Administered 2021-09-29 – 2021-10-04 (×12): 10 mg via ORAL

## 2021-09-28 MED ORDER — MELATONIN 5 MG PO TAB
5 mg | Freq: Every evening | ORAL | 0 refills | Status: AC | PRN
Start: 2021-09-28 — End: ?

## 2021-09-28 MED ORDER — ONDANSETRON 4 MG PO TBDI
4 mg | ORAL | 0 refills | Status: AC | PRN
Start: 2021-09-28 — End: ?

## 2021-09-28 MED ORDER — SENNOSIDES-DOCUSATE SODIUM 8.6-50 MG PO TAB
1 | Freq: Every day | ORAL | 0 refills | Status: AC | PRN
Start: 2021-09-28 — End: ?

## 2021-09-28 MED ORDER — ACETAMINOPHEN 325 MG PO TAB
650 mg | ORAL | 0 refills | Status: AC | PRN
Start: 2021-09-28 — End: ?
  Administered 2021-10-01: 01:00:00 650 mg via ORAL

## 2021-09-28 MED ORDER — AMLODIPINE 10 MG PO TAB
10 mg | Freq: Every day | ORAL | 0 refills | Status: AC
Start: 2021-09-28 — End: ?
  Administered 2021-09-29 – 2021-10-04 (×6): 10 mg via ORAL

## 2021-09-28 MED ORDER — DIPHENOXYLATE-ATROPINE 2.5-0.025 MG PO TAB
1 | Freq: Four times a day (QID) | ORAL | 0 refills | Status: AC | PRN
Start: 2021-09-28 — End: ?
  Administered 2021-10-04 (×3): 1 via ORAL

## 2021-09-28 MED ORDER — ONDANSETRON HCL (PF) 4 MG/2 ML IJ SOLN
4 mg | INTRAVENOUS | 0 refills | Status: AC | PRN
Start: 2021-09-28 — End: ?
  Administered 2021-10-02: 09:00:00 4 mg via INTRAVENOUS

## 2021-09-28 NOTE — H&P (View-Only)
Admission History and Physical Examination      Name:  Vicki Buchanan                                             MRN:  [id number]   Admission Date:  09/28/2021                     Assessment and Plan      Principal Problem:    Hyperbilirubinemia  Active Problems:    Peritoneal carcinomatosis (HCC)    Cancer of appendix (HCC)      Vicki Buchanan is a 66 y.o. 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   - EUS/ERCP + Biliary stent placement on 6/30  - Presented for treatment appointment 7/12, labs notable for bilirubin 4.1, AST 218, ALT 143, Alk Phos 913. 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 with biopsy revealing diversion colitis  Plan  > Oxycodone for rectal pain  > GI consult, appreciate recommendations for treatment    Hx Malignant SBO, s/p ostomy  - Currently with no ostomy issues    CKD III  - Baseline 1.6-2  - Cr 1.89 on admit    HTN  > Continue amlodipine 10mg  daily      FEN/ppx  IVF: None  Electrolytes: replacing prn  Diet: No diet orders on file  DVT ppx: Heparin    Code status: Full Code  Disposition: Admit to medicine      Ladell Pier, MD  Voalte/AMS  To reach covering physician or NP 24/7, search [facility] directory for the current listed service.    ________________________________________________________________________                     HPI   Chief Complaint:  Abnormal Labs    Vicki Buchanan was directly admitted today due to abnormal labs in clinic (AST/ALT/bili elevated). She has been feeling well, no fever/chills, no itching like she did with previous biliary obstruction. No RUQ pain. No nausea/vomiting. Her main complaint at this time is rectal pain. She is unable to sleep at night. Pain slightly better with sitz bath. She has had leakage of clear/brown fluid from anus, no blood. She had colonoscopy recently; discussed results of biopsies showing diversion colitis.     ROS  Review of Systems   Constitutional: Positive for malaise/fatigue. Negative for chills and fever.   Gastrointestinal:  Negative for abdominal pain, blood in stool, nausea and vomiting.   All 12 systems otherwise negative.                    Med/Fam/Soc/Surg. History     Medical History:   Diagnosis Date   ? Bundle branch block    ? Cancer [facility])    ? Cancer of appendix [facility])      Surgical History:   Procedure Laterality Date   ? CESAREAN SECTION  1981   ? HX CHOLECYSTECTOMY  1995   ? LAPAROSCOPY  02/2017    biopsy peritoneal   ? COLONOSCOPY DIAGNOSTIC WITH SPECIMEN COLLECTION BY BRUSHING/ WASHING - FLEXIBLE N/A 03/19/2017    Performed by Everardo All, MD at [facility] ENDO   ? COLONOSCOPY WITH BIOPSY - FLEXIBLE  03/19/2017    Performed by Everardo All, MD at [facility] ENDO   ? EXPLORATORY LAPAROTOMY, DIVERTING LOOP ILEOSTOMY N/A 04/03/2017    Performed by Loralie Champagne, MD at [facility] OR   ? INSERTION TUNNELED CENTRAL VENOUS CATHETER - AGE 40 YEARS AND OVER Left 04/23/2017    Performed by Loralie Champagne, MD at [facility] OR   ? CYSTOURETHROSCOPY WITH INDWELLING URETERAL STENT INSERTION Bilateral 04/09/2018    Performed by Earl Many, MD at [facility] OR   ? CYSTOURETHROSCOPY WITH URETERAL CATHETERIZATION WITH/ WITHOUT IRRIGATION/ INSTILLATION/ URETEROPYELOGRAPHY Bilateral 04/09/2018    Performed by Earl Many, MD at [facility] OR   ? CYSTOURETHROSCOPY WITH INDWELLING URETERAL STENT EXCHANGE (RIGHT 6 x 26cm / LEFT 6 x 28cm) Bilateral 08/08/2018    Performed by Earl Many, MD at [facility] OR   ? RETROGRADE UROGRAPHY WITH/ WITHOUT KUB Bilateral 08/08/2018    Performed by Earl Many, MD at [facility] OR   ? ENDOSCOPIC RETROGRADE CHOLANGIOPANCREATOGRAPHY WITH SPHINCTEROTOMY/ PAPILLOTOMY N/A 09/16/2021    Performed by Vertell Novak, MD at [facility] ENDO   ? ESOPHAGOGASTRODUODENOSCOPY WITH ENDOSCOPIC ULTRASOUND EXAMINATION - FLEXIBLE  09/16/2021    Performed by Vertell Novak, MD at [facility] ENDO   ? ENDOSCOPIC RETROGRADE CHOLANGIOPANCREATOGRAPHY WITH PLACEMENT ENDOSCOPIC STENT INTO BILIARY/ PANCREATIC DUCT - EACH STENT  09/16/2021    Performed by Vertell Novak, MD at [facility] ENDO   ? COLONOSCOPY DIAGNOSTIC WITH SPECIMEN COLLECTION BY BRUSHING/ WASHING - FLEXIBLE N/A 09/26/2021    Performed by Vertell Novak, MD at [facility] OR   ? ILEOSTOMY OR JEJUNOSTOMY Right    ? TUNNELED VENOUS PORT PLACEMENT Left    ? URETER STENT PLACEMENT Bilateral      Family History   Problem Relation Age of Onset   ? Diabetes Mother    ? Cancer Father    ? Diabetes Brother    ? Cancer-Lung Paternal Aunt    ? Cancer-Breast Maternal Grandmother    ? Diabetes Paternal Grandmother      Social History     Socioeconomic History   ? Marital status: Married   Tobacco Use   ? Smoking status: Never   ? Smokeless tobacco: Never   Vaping Use   ? Vaping Use: Never used   Substance and Sexual Activity   ? Alcohol use: No     Comment: rarely   ? Drug use: No       Prior-to-Admit Medications  Medications Prior to Admission   Medication Sig   ? amLODIPine (NORVASC) 10 mg tablet Take one tablet by mouth daily.   ? Cetirizine (ZYRTEC) 10 mg cap Take one capsule by mouth as Needed.   ? ciprofloxacin (CIPRO)  250 mg tablet Take one tablet by mouth every 48 hours.   ? dexAMETHasone (DECADRON) 4 mg tablet 1 tab by mouth with breakfast and afternoon snack on days 2 & 3 following each chemotherapy cycle. Do not take after 6 pm to avoid insomnia.   ? diphenoxylate-atropine (LOMOTIL) 2.5-0.025 mg tablet Take one tablet by mouth four times daily as needed for Diarrhea.   ? famotidine (PEPCID AC) 10 mg tablet Take one tablet by mouth twice daily.   ? hyoscyamine sulfate (LEVSIN/SL) 0.125 mg sublingual tablet Place one tablet under tongue four times daily as needed for Cramps.   ? loperamide (IMODIUM A-D) 2 mg capsule Take one pill by mouth four times daily as needed   ? LORazepam (ATIVAN) 0.5 mg tablet Take 1-2 tabs by mouth every 6 hrs as needed N/V not controlled by Zofran or Compazine. May also use every 6 hrs as needed anxiety or at bedtime insomnia.   ? magnesium oxide 400 mg magnesium tablet Take one tablet by mouth twice daily.   ? MULTIVITAMIN PO Take 1 tablet by mouth daily.   ? ondansetron (ZOFRAN ODT) 8 mg rapid dissolve tablet Dissolve one tablet by mouth every 8 hours as needed for Nausea or Vomiting. Place on tongue to disolve.   ? potassium chloride (KLOR-CON SPRINKLE) 10 mEq capsule Take one capsule by mouth twice daily. If potassium < 3.5. Take with a meal and a full glass of water.   ? prochlorperazine maleate (COMPAZINE) 10 mg tablet 1 tablet by mouth every 6 hours as needed for nausea not controlled by Zofran.   ? psyllium husk (METAMUCIL) 0.4 gram cap    ? sodium chloride 0.9% (NS) 0.9 % 1,000 mL with potassium chloride 2 mEq/mL 20 mEq, magnesium sulfate 4 mEq/mL (50 %) 2 g IV infusion Administer 1L NS with K and 2g Mg once weekly via outpatient infusion.       Allergies:  Shrimp                       Physical Exam     Vital Signs: Last Filed In 24 Hours Vital Signs: 24 Hour Range BP: 145/93 (07/12 1805)  Temp: 37 ?C (98.6 ?F) (07/12 1805)  Pulse: 90 (07/12 1805)  Respirations: 16 PER MINUTE (07/12 1805)  SpO2: 98 % (07/12 1805)  O2 Device: None (Room air) (07/12 1805) BP: (107-145)/(77-93)   Temp:  [37 ?C (98.6 ?F)]   Pulse:  [87-90]   Respirations:  [16 PER MINUTE]   SpO2:  [98 %-100 %]   O2 Device: None (Room air)          Physical Exam  Constitutional:       General: She is not in acute distress.     Appearance: She is not ill-appearing.   HENT:      Head: Normocephalic and atraumatic.   Cardiovascular:      Rate and Rhythm: Normal rate and regular rhythm.   Pulmonary:      Breath sounds: No wheezing, rhonchi or rales.   Abdominal:      General: There is no distension.      Tenderness: There is no abdominal tenderness.      Comments: Ostomy present   Neurological:      General: No focal deficit present.      Mental Status: She is alert.   Psychiatric:         Mood and Affect: Mood normal.  Behavior: Behavior normal.         Thought Content: Thought content normal.         Judgment: Judgment normal.                             Labs/Radiology     24-hour labs:    CBC w diff    Lab Results   Component Value Date/Time    WBC 12.4 (H) 09/28/2021 09:05 AM    RBC 3.44 (L) 09/28/2021 09:05 AM    HGB 11.8 (L) 09/28/2021 09:05 AM    HCT 35.6 (L) 09/28/2021 09:05 AM    MCV 103.7 (H) 09/28/2021 09:05 AM    MCH 34.3 (H) 09/28/2021 09:05 AM    MCHC 33.1 09/28/2021 09:05 AM    RDW 13.8 09/28/2021 09:05 AM    PLTCT 352 09/28/2021 09:05 AM    MPV 9.4 09/28/2021 09:05 AM    Lab Results   Component Value Date/Time    NEUT 66 09/28/2021 09:05 AM    ANC 8.30 (H) 09/28/2021 09:05 AM    LYMA 17 (L) 09/28/2021 09:05 AM    ALC 2.10 09/28/2021 09:05 AM    MONA 13 (H) 09/28/2021 09:05 AM    AMC 1.60 (H) 09/28/2021 09:05 AM    EOSA 3 09/28/2021 09:05 AM    AEC 0.30 09/28/2021 09:05 AM    BASA 1 09/28/2021 09:05 AM    ABC 0.20 09/28/2021 09:05 AM        Comprehensive Metabolic Profile    Lab Results Component Value Date/Time    NA 137 09/28/2021 09:05 AM    K 3.6 09/28/2021 09:05 AM    CL 98 09/28/2021 09:05 AM    CO2 28 09/28/2021 09:05 AM    GAP 11 09/28/2021 09:05 AM    BUN 34 (H) 09/28/2021 09:05 AM    CR 1.89 (H) 09/28/2021 09:05 AM    GLU 110 (H) 09/28/2021 09:05 AM    Lab Results   Component Value Date/Time    CA 9.6 09/28/2021 09:05 AM    PO4 3.3 11/26/2018 03:40 AM    ALBUMIN 3.7 09/28/2021 09:05 AM    TOTPROT 8.2 (H) 09/28/2021 09:05 AM    ALKPHOS 913 (H) 09/28/2021 09:05 AM    AST 218 (H) 09/28/2021 09:05 AM    ALT 143 (H) 09/28/2021 09:05 AM    TOTBILI 4.1 (H) 09/28/2021 09:05 AM    GFR 23 (L) 09/24/2019 01:49 PM    GFRAA 28 (L) 09/24/2019 01:49 PM            Pertinent radiology reviewed.    Ladell Pier, MD  Voalte/AMS Messaging

## 2021-09-28 NOTE — Progress Notes
Patient received 2L NS as ordered without complications. Chest port flushed with brisk blood return and remains accessed for inpatient admission. Patient and husband updated that patient would be admitted today and to wait for hospital to call when a bed is ready. Patient and husband verbalized understanding, will stay in town close to the hospital. Patient and husband deny additional questions or concerns at this time. Patient left treatment via wheelchair, alert and oriented, accompanied by husband.

## 2021-09-28 NOTE — Progress Notes
[facility]: [facility], [facility]  [address] [place] [phone]   Cancer Center Transfer of Care   Transfer Type: Exam to Treatment with discharge to [place] (Admitting Contacted?- Yes and Orders sent?-  Admitting physician to place orders)   Transferring Provider: Lanelle Bal, APRN   Phone or Pager #: [phone]   Reason for Transfer: Biliary obstruction, elevated WBC   Hand off / Report N/A   Cancer Diagnosis: Appendical Cancer   Is patient  part of a clinical trial? No   Clinical Research Coordinator Name:  Number:   Principal Investigator/ Treating Physician Name:   Pager:    Biohazardous drug exposure (if any): No   Isolation Status: None   Last Vital Signs: There were no vitals filed for this visit.   Oxygen Needs: On Room Air   Mental Status: alert   IV/Port Access: Port (Single)   Line Status: Accessed   Ambulation Status: Wheelchair- Assisted Transfers   Meds Given Prior to Transfer: Medications - No data to display   Labs Collected Prior to Transfer: CBC and Comprehensive Metabolic Panel (CMP)   Bed assigned No

## 2021-09-29 ENCOUNTER — Encounter: Admit: 2021-09-29 | Discharge: 2021-09-29 | Payer: MEDICARE

## 2021-09-29 ENCOUNTER — Inpatient Hospital Stay: Admit: 2021-09-29 | Discharge: 2021-09-29 | Payer: MEDICARE

## 2021-09-29 DIAGNOSIS — C801 Malignant (primary) neoplasm, unspecified: Secondary | ICD-10-CM

## 2021-09-29 DIAGNOSIS — C181 Malignant neoplasm of appendix: Secondary | ICD-10-CM

## 2021-09-29 DIAGNOSIS — I454 Nonspecific intraventricular block: Secondary | ICD-10-CM

## 2021-09-29 MED ORDER — FENTANYL CITRATE (PF) 50 MCG/ML IJ SOLN
INTRAVENOUS | 0 refills | Status: DC
Start: 2021-09-29 — End: 2021-09-29
  Administered 2021-09-29: 20:00:00 50 ug via INTRAVENOUS

## 2021-09-29 MED ORDER — LACTATED RINGERS IV SOLP
INTRAVENOUS | 0 refills | Status: DC
Start: 2021-09-29 — End: 2021-09-29
  Administered 2021-09-29: 20:00:00 via INTRAVENOUS

## 2021-09-29 MED ORDER — CEFTRIAXONE 1 GRAM IJ SOLR
INTRAVENOUS | 0 refills | Status: DC
Start: 2021-09-29 — End: 2021-09-29
  Administered 2021-09-29: 21:00:00 1 g via INTRAVENOUS

## 2021-09-29 MED ORDER — LIDOCAINE (PF) 200 MG/10 ML (2 %) IJ SYRG
INTRAVENOUS | 0 refills | Status: DC
Start: 2021-09-29 — End: 2021-09-29
  Administered 2021-09-29: 20:00:00 50 mg via INTRAVENOUS

## 2021-09-29 MED ORDER — PROPOFOL INJ 10 MG/ML IV VIAL
INTRAVENOUS | 0 refills | Status: DC
Start: 2021-09-29 — End: 2021-09-29
  Administered 2021-09-29: 20:00:00 120 mg via INTRAVENOUS

## 2021-09-29 MED ORDER — PHENYLEPHRINE HCL IN 0.9% NACL 1 MG/10 ML (100 MCG/ML) IV SYRG
INTRAVENOUS | 0 refills | Status: DC
Start: 2021-09-29 — End: 2021-09-29
  Administered 2021-09-29 (×3): 100 ug via INTRAVENOUS

## 2021-09-29 MED ORDER — DEXAMETHASONE SODIUM PHOSPHATE 4 MG/ML IJ SOLN
INTRAVENOUS | 0 refills | Status: DC
Start: 2021-09-29 — End: 2021-09-29
  Administered 2021-09-29: 20:00:00 4 mg via INTRAVENOUS

## 2021-09-29 MED ORDER — ARTIFICIAL TEARS (PF) SINGLE DOSE DROPS GROUP
OPHTHALMIC | 0 refills | Status: DC
Start: 2021-09-29 — End: 2021-09-29
  Administered 2021-09-29: 20:00:00 2 [drp] via OPHTHALMIC

## 2021-09-29 MED ORDER — SUCCINYLCHOLINE CHLORIDE 20 MG/ML IJ SOLN
INTRAVENOUS | 0 refills | Status: DC
Start: 2021-09-29 — End: 2021-09-29
  Administered 2021-09-29: 20:00:00 60 mg via INTRAVENOUS

## 2021-09-29 MED ORDER — ONDANSETRON HCL (PF) 4 MG/2 ML IJ SOLN
INTRAVENOUS | 0 refills | Status: DC
Start: 2021-09-29 — End: 2021-09-29
  Administered 2021-09-29: 21:00:00 4 mg via INTRAVENOUS

## 2021-09-29 MED ADMIN — IOHEXOL 300 MG IODINE/ML IV SOLN [79156]: 10 mL | INTRAMUSCULAR | @ 21:00:00 | Stop: 2021-09-29 | NDC 00407141361

## 2021-09-29 MED ADMIN — SODIUM CHLORIDE 0.9 % IV PGBK (MB+) [95161]: 4.5 g | INTRAVENOUS | @ 19:00:00 | NDC 00338915930

## 2021-09-29 MED ADMIN — PIPERACILLIN-TAZOBACTAM 4.5 GRAM IV SOLR [80419]: 4.5 g | INTRAVENOUS | @ 19:00:00 | NDC 60505615900

## 2021-09-29 NOTE — Care Coordination-Inpatient
This patient has been assigned to [facility]. The covering provider can be reached via Voalte, under the [facility] First Call. Voalte is the preferred method of communication, but if needed, the pager number for this team is [facility]- [phone]

## 2021-09-29 NOTE — Anesthesia Post-Procedure Evaluation
Post-Anesthesia Evaluation    Name: Vicki Buchanan      MRN: [id number]     DOB: [birth year]     Age: 66 y.o.     Sex: female   __________________________________________________________________________     Procedure Information     Anesthesia Start Date/Time: 09/29/21 1505    Procedure: ENDOSCOPIC RETROGRADE CHOLANGIOPANCREATOGRAPHY WITH REMOVAL AND EXCHANGE OF STENT BILIARY/ PANCREATIC DUCT - EACH STENT EXCHANGED    Location: ENDO 1 / ENDO/GI    Surgeons: Eliott Nine, MD          Post-Anesthesia Vitals  BP: 146/84 (07/13 1625)  Temp: 36.4 C (97.6 F) (07/13 1551)  Pulse: 71 (07/13 1625)  Respirations: 15 PER MINUTE (07/13 1625)  SpO2: 96 % (07/13 1620)  SpO2 Pulse: 72 (07/13 1625)  O2 Device: None (Room air) (07/13 1551)   Vitals Value Taken Time   BP 146/84 09/29/21 1625   Temp     Pulse 71 09/29/21 1625   Respirations 15 PER MINUTE 09/29/21 1625   SpO2 96 % 09/29/21 1620   O2 Device     ABP     ART BP           Post Anesthesia Evaluation Note    Evaluation location: Pre/Post  Patient participation: recovered; patient participated in evaluation  Level of consciousness: alert  Pain management: adequate    Hydration: normovolemia  Temperature: 36.0C - 38.4C  Airway patency: adequate    Perioperative Events       Post-op nausea and vomiting: no PONV    Postoperative Status  Cardiovascular status: hemodynamically stable  Respiratory status: spontaneous ventilation  Follow-up needed: none        Perioperative Events  There were no known notable events for this encounter.

## 2021-09-29 NOTE — Anesthesia Pre-Procedure Evaluation
Anesthesia Pre-Procedure Evaluation    Name: Vicki Buchanan      MRN: [id number]     DOB: 1955/12/09     Age: 66 y.o.     Sex: female   __________________________________________________________________________     Procedure Date: 09/29/2021   Procedure: Procedure(s):  ENDOSCOPIC RETROGRADE CHOLANGIOPANCREATOGRAPHY WITH REMOVAL AND EXCHANGE OF STENT BILIARY/ PANCREATIC DUCT - EACH STENT EXCHANGED     Physical Assessment  Vital Signs (last filed in past 24 hours):  BP: 131/74 (07/13 1204)  Temp: 36.6 ?C (97.9 ?F) (07/13 1204)  Pulse: 75 (07/13 1204)  Respirations: 16 PER MINUTE (07/13 1204)  SpO2: 100 % (07/13 1204)  O2 Device: None (Room air) (07/13 1204)  Weight: 52.7 kg (116 lb 2.9 oz) (07/13 0215)      Patient History  Allergies   Allergen Reactions   ? Shrimp SEE COMMENTS     Eye irritation. Namely blood vessel issues in the eyes         Current Medications    Medication Directions   amLODIPine (NORVASC) 10 mg tablet Take one tablet by mouth daily.  Patient taking differently: Take one tablet by mouth every morning.   Cetirizine (ZYRTEC) 10 mg cap Take one capsule by mouth as Needed.   ciprofloxacin (CIPRO) 250 mg tablet Take one tablet by mouth every 48 hours.   dexAMETHasone (DECADRON) 4 mg tablet 1 tab by mouth with breakfast and afternoon snack on days 2 & 3 following each chemotherapy cycle. Do not take after 6 pm to avoid insomnia.   diphenoxylate-atropine (LOMOTIL) 2.5-0.025 mg tablet Take one tablet by mouth four times daily as needed for Diarrhea.   famotidine (PEPCID AC) 10 mg tablet Take one tablet by mouth twice daily.   hyoscyamine sulfate (LEVSIN/SL) 0.125 mg sublingual tablet Place one tablet under tongue four times daily as needed for Cramps.   loperamide (IMODIUM A-D) 2 mg capsule Take one pill by mouth four times daily as needed   LORazepam (ATIVAN) 0.5 mg tablet Take 1-2 tabs by mouth every 6 hrs as needed N/V not controlled by Zofran or Compazine. May also use every 6 hrs as needed anxiety or at bedtime insomnia.   magnesium oxide 400 mg magnesium tablet Take one tablet by mouth twice daily.  Patient taking differently: Take one tablet by mouth twice daily as needed.   ondansetron (ZOFRAN ODT) 8 mg rapid dissolve tablet Dissolve one tablet by mouth every 8 hours as needed for Nausea or Vomiting. Place on tongue to disolve.   potassium chloride (KLOR-CON SPRINKLE) 10 mEq capsule Take one capsule by mouth twice daily. If potassium < 3.5. Take with a meal and a full glass of water.   prochlorperazine maleate (COMPAZINE) 10 mg tablet 1 tablet by mouth every 6 hours as needed for nausea not controlled by Zofran.   psyllium husk (METAMUCIL) 0.4 gram cap Take one capsule by mouth daily as needed.   sod chlor,sod bicarb/neti pot (SINUS WASH NETI POT SINI) Use as directed   sodium chloride 0.9% (NS) 0.9 % 1,000 mL with potassium chloride 2 mEq/mL 20 mEq, magnesium sulfate 4 mEq/mL (50 %) 2 g IV infusion Administer 1L NS with K and 2g Mg once weekly via outpatient infusion.   traMADoL (ULTRAM) 50 mg tablet Take one tablet to two tablets by mouth daily as needed for Pain.         Review of Systems/Medical History      Patient summary reviewed  Pertinent labs reviewed  PONV Screening: Non-smoker and Female sex  No history of anesthetic complications  No family history of anesthetic complications      Airway - negative        Previous grade 1 view with MAC 3      Pulmonary          Lung metastases s/p radiation      Cardiovascular - negative        Exercise tolerance: >4 METS      Beta Blocker therapy: No      Beta blockers within 24 hours: n/a                GI/Hepatic/Renal             GERD, poorly controlled      Liver disease (biliary obstruction):         Renal disease: CKD Stage 3 (eGFR 30-59)           S/p HIPEC, 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        Neuro/Psych - negative        Musculoskeletal         No back pain        Endocrine/Other           Anemia        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    Abdominal Findings:       Not obese    Neurological Findings:       Alert and oriented x 3    Constitutional findings:       No acute distress       Diagnostic Tests  Hematology:   Lab Results   Component Value Date    HGB 11.8 09/29/2021    HCT 35.4 09/29/2021    PLTCT 341 09/29/2021    WBC 16.0 09/29/2021    NEUT 74 09/29/2021    ANC 12.03 09/29/2021    LYMPH 50 08/31/2021    ALC 2.17 09/29/2021    MONA 10 09/29/2021    AMC 1.56 09/29/2021    EOSA 1 09/29/2021    ABC 0.15 09/29/2021    MCV 103.1 09/29/2021    MCH 34.3 09/29/2021    MCHC 33.3 09/29/2021    MPV 9.4 09/29/2021    RDW 14.1 09/29/2021         General Chemistry:   Lab Results   Component Value Date    NA 139 09/29/2021    K 3.8 09/29/2021    CL 100 09/29/2021    CO2 27 09/29/2021    GAP 12 09/29/2021    BUN 26 09/29/2021    CR 1.53 09/29/2021    GLU 106 09/29/2021    CA 9.7 09/29/2021  ALBUMIN 3.5 09/29/2021    MG 1.6 09/28/2021    TOTBILI 5.2 09/29/2021    PO4 3.3 11/26/2018      Coagulation:   Lab Results   Component Value Date    PTT 28.7 07/10/2018    INR 1.3 07/10/2018         Anesthesia Plan    ASA score: 3   Plan: general  Induction method: intravenous  NPO status: acceptable      Informed Consent  Anesthetic plan and risks discussed with patient.  Use of blood products discussed with patient  Blood Consent: consented      Plan discussed with: anesthesiologist and CRNA.    PAC Plan      Alerts

## 2021-09-30 ENCOUNTER — Ambulatory Visit: Admit: 2021-09-30 | Discharge: 2021-09-30 | Payer: MEDICARE

## 2021-09-30 ENCOUNTER — Encounter: Admit: 2021-09-30 | Discharge: 2021-09-30 | Payer: MEDICARE

## 2021-09-30 DIAGNOSIS — Z4689 Encounter for fitting and adjustment of other specified devices: Secondary | ICD-10-CM

## 2021-09-30 DIAGNOSIS — I454 Nonspecific intraventricular block: Secondary | ICD-10-CM

## 2021-09-30 DIAGNOSIS — C181 Malignant neoplasm of appendix: Secondary | ICD-10-CM

## 2021-09-30 DIAGNOSIS — C801 Malignant (primary) neoplasm, unspecified: Secondary | ICD-10-CM

## 2021-09-30 DIAGNOSIS — C786 Secondary malignant neoplasm of retroperitoneum and peritoneum: Secondary | ICD-10-CM

## 2021-09-30 MED ADMIN — SODIUM CHLORIDE 0.9 % IV PGBK (MB+) [95161]: 4.5 g | INTRAVENOUS | @ 02:00:00 | NDC 00338915930

## 2021-09-30 MED ADMIN — PIPERACILLIN-TAZOBACTAM 4.5 GRAM IV SOLR [80419]: 4.5 g | INTRAVENOUS | @ 02:00:00 | NDC 60505615900

## 2021-09-30 MED ADMIN — SODIUM CHLORIDE 0.9 % IV PGBK (MB+) [95161]: 4.5 g | INTRAVENOUS | @ 10:00:00 | NDC 00338915930

## 2021-09-30 MED ADMIN — SODIUM CHLORIDE 0.9 % IV PGBK (MB+) [95161]: 4.5 g | INTRAVENOUS | @ 18:00:00 | NDC 00338915930

## 2021-09-30 MED ADMIN — PIPERACILLIN-TAZOBACTAM 4.5 GRAM IV SOLR [80419]: 4.5 g | INTRAVENOUS | @ 10:00:00 | NDC 60505615900

## 2021-09-30 MED ADMIN — PIPERACILLIN-TAZOBACTAM 4.5 GRAM IV SOLR [80419]: 4.5 g | INTRAVENOUS | @ 18:00:00 | NDC 60505615900

## 2021-10-01 MED ADMIN — PIPERACILLIN-TAZOBACTAM 4.5 GRAM IV SOLR [80419]: 4.5 g | INTRAVENOUS | @ 19:00:00 | NDC 60505615900

## 2021-10-01 MED ADMIN — SODIUM CHLORIDE 0.9 % IV PGBK (MB+) [95161]: 4.5 g | INTRAVENOUS | @ 10:00:00 | NDC 00338915930

## 2021-10-01 MED ADMIN — PIPERACILLIN-TAZOBACTAM 4.5 GRAM IV SOLR [80419]: 4.5 g | INTRAVENOUS | @ 01:00:00 | NDC 60505615900

## 2021-10-01 MED ADMIN — SODIUM CHLORIDE 0.9 % IV PGBK (MB+) [95161]: 4.5 g | INTRAVENOUS | @ 01:00:00 | NDC 00338915930

## 2021-10-01 MED ADMIN — PIPERACILLIN-TAZOBACTAM 4.5 GRAM IV SOLR [80419]: 4.5 g | INTRAVENOUS | @ 10:00:00 | NDC 60505615900

## 2021-10-01 MED ADMIN — SODIUM CHLORIDE 0.9 % IV PGBK (MB+) [95161]: 4.5 g | INTRAVENOUS | @ 19:00:00 | NDC 00338915930

## 2021-10-02 MED ADMIN — SODIUM CHLORIDE 0.9 % IV PGBK (MB+) [95161]: 4.5 g | INTRAVENOUS | @ 11:00:00 | NDC 00338915930

## 2021-10-02 MED ADMIN — CALCIUM CARBONATE 200 MG CALCIUM (500 MG) PO CHEW [9385]: 500 mg | ORAL | @ 06:00:00 | NDC 66553000401

## 2021-10-02 MED ADMIN — PIPERACILLIN-TAZOBACTAM 4.5 GRAM IV SOLR [80419]: 4.5 g | INTRAVENOUS | @ 11:00:00 | NDC 60505615900

## 2021-10-02 MED ADMIN — SODIUM CHLORIDE 0.9 % IV SOLP [27838]: 1000 mL | INTRAVENOUS | @ 17:00:00 | Stop: 2021-10-02 | NDC 00338004904

## 2021-10-02 MED ADMIN — CALCIUM CARBONATE 200 MG CALCIUM (500 MG) PO CHEW [9385]: 500 mg | ORAL | @ 14:00:00 | NDC 66553000401

## 2021-10-02 MED ADMIN — PIPERACILLIN-TAZOBACTAM 4.5 GRAM IV SOLR [80419]: 4.5 g | INTRAVENOUS | @ 17:00:00 | NDC 60505615900

## 2021-10-02 MED ADMIN — SODIUM CHLORIDE 0.9 % IV SOLP [27838]: 1000 mL | INTRAVENOUS | @ 02:00:00 | Stop: 2021-10-02 | NDC 00338004904

## 2021-10-02 MED ADMIN — SODIUM CHLORIDE 0.9 % IV PGBK (MB+) [95161]: 4.5 g | INTRAVENOUS | @ 17:00:00 | NDC 00338915930

## 2021-10-02 MED ADMIN — SODIUM CHLORIDE 0.9 % IV PGBK (MB+) [95161]: 4.5 g | INTRAVENOUS | @ 02:00:00 | NDC 00338915930

## 2021-10-02 MED ADMIN — ACETAMINOPHEN/LIDOCAINE/ANTACID DS(#) 1:1:3  PO SUSP [210000]: 30 mL | ORAL | @ 17:00:00 | NDC 54029002209

## 2021-10-02 MED ADMIN — PIPERACILLIN-TAZOBACTAM 4.5 GRAM IV SOLR [80419]: 4.5 g | INTRAVENOUS | @ 02:00:00 | NDC 60505615900

## 2021-10-03 ENCOUNTER — Encounter: Admit: 2021-10-03 | Discharge: 2021-10-03 | Payer: MEDICARE

## 2021-10-03 MED ADMIN — PIPERACILLIN-TAZOBACTAM 4.5 GRAM IV SOLR [80419]: 4.5 g | INTRAVENOUS | @ 02:00:00 | NDC 60505615900

## 2021-10-03 MED ADMIN — ACETAMINOPHEN/LIDOCAINE/ANTACID DS(#) 1:1:3  PO SUSP [210000]: 30 mL | ORAL | @ 18:00:00 | NDC 54029002209

## 2021-10-03 MED ADMIN — ACETAMINOPHEN/LIDOCAINE/ANTACID DS(#) 1:1:3  PO SUSP [210000]: 30 mL | ORAL | @ 13:00:00 | NDC 54029002209

## 2021-10-03 MED ADMIN — PIPERACILLIN-TAZOBACTAM 4.5 GRAM IV SOLR [80419]: 4.5 g | INTRAVENOUS | @ 18:00:00 | Stop: 2021-10-04 | NDC 60505615900

## 2021-10-03 MED ADMIN — CYCLOBENZAPRINE 10 MG PO TAB [2017]: 5 mg | ORAL | @ 22:00:00 | NDC 72888001400

## 2021-10-03 MED ADMIN — SHORT CHAIN FATTY ACID ENEMA 60ML [211393]: 60 mL | RECTAL | @ 23:00:00 | NDC 54029057101

## 2021-10-03 MED ADMIN — SODIUM CHLORIDE 0.9 % IV PGBK (MB+) [95161]: 4.5 g | INTRAVENOUS | @ 18:00:00 | Stop: 2021-10-04 | NDC 00338915930

## 2021-10-03 MED ADMIN — CYCLOBENZAPRINE 10 MG PO TAB [2017]: 5 mg | ORAL | @ 12:00:00 | NDC 72888001400

## 2021-10-03 MED ADMIN — SODIUM CHLORIDE 0.9 % IV PGBK (MB+) [95161]: 4.5 g | INTRAVENOUS | @ 02:00:00 | NDC 00338915930

## 2021-10-03 MED ADMIN — PIPERACILLIN-TAZOBACTAM 4.5 GRAM IV SOLR [80419]: 4.5 g | INTRAVENOUS | @ 10:00:00 | Stop: 2021-10-04 | NDC 60505615900

## 2021-10-03 MED ADMIN — SODIUM CHLORIDE 0.9 % IV PGBK (MB+) [95161]: 4.5 g | INTRAVENOUS | @ 10:00:00 | Stop: 2021-10-04 | NDC 00338915930

## 2021-10-04 ENCOUNTER — Encounter: Admit: 2021-10-04 | Discharge: 2021-10-04 | Payer: MEDICARE

## 2021-10-04 DIAGNOSIS — C181 Malignant neoplasm of appendix: Secondary | ICD-10-CM

## 2021-10-04 MED ADMIN — CYCLOBENZAPRINE 10 MG PO TAB [2017]: 5 mg | ORAL | @ 13:00:00 | Stop: 2021-10-04 | NDC 72888001400

## 2021-10-04 MED ADMIN — TRAMADOL 50 MG PO TAB [14632]: 50 mg | ORAL | @ 01:00:00 | NDC 00904717961

## 2021-10-04 MED ADMIN — CARVEDILOL 6.25 MG PO TAB [77309]: 6.25 mg | ORAL | @ 15:00:00 | Stop: 2021-10-04 | NDC 00904630161

## 2021-10-04 MED ADMIN — SODIUM CHLORIDE 0.9 % IV SOLP [27838]: 1000 mL | INTRAVENOUS | @ 14:00:00 | Stop: 2021-10-04 | NDC 00338004904

## 2021-10-04 MED ADMIN — CYCLOBENZAPRINE 10 MG PO TAB [2017]: 5 mg | ORAL | @ 08:00:00 | Stop: 2021-10-04 | NDC 72888001400

## 2021-10-04 MED ADMIN — POTASSIUM CHLORIDE 20 MEQ PO TBTQ [35943]: 40 meq | ORAL | @ 13:00:00 | Stop: 2021-10-04 | NDC 00832532511

## 2021-10-04 MED ADMIN — SHORT CHAIN FATTY ACID ENEMA 60ML [211393]: 60 mL | RECTAL | @ 13:00:00 | Stop: 2021-10-04 | NDC 54029057101

## 2021-10-04 MED ADMIN — ACETAMINOPHEN/LIDOCAINE/ANTACID DS(#) 1:1:3  PO SUSP [210000]: 30 mL | ORAL | @ 13:00:00 | Stop: 2021-10-04 | NDC 54029002209

## 2021-10-04 MED ADMIN — SODIUM CHLORIDE 0.9 % IV PGBK (MB+) [95161]: 4.5 g | INTRAVENOUS | @ 01:00:00 | Stop: 2021-10-04 | NDC 00338915930

## 2021-10-04 MED ADMIN — ACETAMINOPHEN/LIDOCAINE/ANTACID DS(#) 1:1:3  PO SUSP [210000]: 30 mL | ORAL | @ 08:00:00 | Stop: 2021-10-04 | NDC 54029002209

## 2021-10-04 MED ADMIN — PIPERACILLIN-TAZOBACTAM 4.5 GRAM IV SOLR [80419]: 4.5 g | INTRAVENOUS | @ 01:00:00 | Stop: 2021-10-04 | NDC 60505615900

## 2021-10-04 MED ADMIN — POTASSIUM CHLORIDE 20 MEQ PO TBTQ [35943]: 60 meq | ORAL | @ 18:00:00 | Stop: 2021-10-04 | NDC 00832532511

## 2021-10-04 MED ADMIN — CYCLOBENZAPRINE 10 MG PO TAB [2017]: 5 mg | ORAL | @ 18:00:00 | Stop: 2021-10-04 | NDC 72888001400

## 2021-10-04 MED FILL — CYCLOBENZAPRINE 5 MG PO TAB: 5 mg | ORAL | 10 days supply | Qty: 30 | Fill #1 | Status: CP

## 2021-10-04 MED FILL — CARVEDILOL 6.25 MG PO TAB: 6.25 mg | ORAL | 30 days supply | Qty: 60 | Fill #1 | Status: CP

## 2021-10-04 MED FILL — RXAMB LIDO/ANTACID/APAP 1:3:1 SUSP (MIXTURE COMPOUND): ORAL | 5 days supply | Qty: 180 | Fill #1 | Status: CP

## 2021-10-04 MED FILL — RXAMB ACETATE-PROPIONATE-BUTYRATE 60-30-40 MM/60 ML RECTAL SUSPENSION (COMPOUND): RECTAL | 7 days supply | Qty: 3.84 | Fill #1 | Status: CP

## 2021-10-05 ENCOUNTER — Encounter: Admit: 2021-10-05 | Discharge: 2021-10-05 | Payer: MEDICARE

## 2021-10-05 DIAGNOSIS — C786 Secondary malignant neoplasm of retroperitoneum and peritoneum: Secondary | ICD-10-CM

## 2021-10-05 DIAGNOSIS — C181 Malignant neoplasm of appendix: Secondary | ICD-10-CM

## 2021-10-06 ENCOUNTER — Encounter: Admit: 2021-10-06 | Discharge: 2021-10-06 | Payer: MEDICARE

## 2021-10-06 MED FILL — RXAMB ACETATE-PROPIONATE-BUTYRATE 60-30-40 MM/60 ML RECTAL SUSPENSION (COMPOUND): RECTAL | 24 days supply | Qty: 13.87 | Fill #2 | Status: AC

## 2021-10-07 ENCOUNTER — Encounter: Admit: 2021-10-07 | Discharge: 2021-10-07 | Payer: MEDICARE

## 2021-10-12 ENCOUNTER — Encounter: Admit: 2021-10-12 | Discharge: 2021-10-12 | Payer: MEDICARE

## 2021-10-12 DIAGNOSIS — I454 Nonspecific intraventricular block: Secondary | ICD-10-CM

## 2021-10-12 DIAGNOSIS — C786 Secondary malignant neoplasm of retroperitoneum and peritoneum: Secondary | ICD-10-CM

## 2021-10-12 DIAGNOSIS — C181 Malignant neoplasm of appendix: Secondary | ICD-10-CM

## 2021-10-12 DIAGNOSIS — C801 Malignant (primary) neoplasm, unspecified: Secondary | ICD-10-CM

## 2021-10-12 LAB — COMPREHENSIVE METABOLIC PANEL
ALK PHOSPHATASE: 544 U/L — ABNORMAL HIGH (ref 25–110)
ALT: 55 U/L (ref 7–56)
AST: 62 U/L — ABNORMAL HIGH (ref 7–40)
BLD UREA NITROGEN: 18 mg/dL (ref 7–25)
CHLORIDE: 106 MMOL/L (ref 98–110)
CO2: 20 MMOL/L — ABNORMAL LOW (ref 21–30)
CREATININE: 1.4 mg/dL — ABNORMAL HIGH (ref 0.4–1.00)
POTASSIUM: 4.5 MMOL/L — ABNORMAL HIGH (ref 3.5–5.1)
SODIUM: 134 MMOL/L — ABNORMAL LOW (ref 137–147)
TOTAL BILIRUBIN: 1.4 mg/dL — ABNORMAL HIGH (ref 0.3–1.2)

## 2021-10-12 LAB — CBC AND DIFF
HEMOGLOBIN: 10 g/dL — ABNORMAL LOW (ref 12.0–15.0)
RBC COUNT: 3 M/UL — ABNORMAL LOW (ref 4.0–5.0)
WBC COUNT: 11 K/UL — ABNORMAL HIGH (ref 4.5–11.0)

## 2021-10-12 LAB — MAGNESIUM: MAGNESIUM: 1.1 mg/dL — ABNORMAL LOW (ref 1.6–2.6)

## 2021-10-12 LAB — CEA(CARCINOEMBRYONIC AG): CEA: 482 ng/mL — ABNORMAL HIGH (ref <3.0)

## 2021-10-12 MED ORDER — ATROPINE 0.4 MG/ML IJ SOLN
.4 mg | INTRAVENOUS | 0 refills | Status: AC | PRN
Start: 2021-10-12 — End: ?
  Administered 2021-10-12: 17:00:00 0.4 mg via INTRAVENOUS

## 2021-10-12 MED ORDER — DEXAMETHASONE 6 MG PO TAB
12 mg | Freq: Once | ORAL | 0 refills | Status: CP
Start: 2021-10-12 — End: ?
  Administered 2021-10-12: 15:00:00 12 mg via ORAL

## 2021-10-12 MED ORDER — BEVACIZUMAB-AWWB IVPB
5 mg/kg | Freq: Once | INTRAVENOUS | 0 refills | Status: CP
Start: 2021-10-12 — End: ?
  Administered 2021-10-12 (×2): 260.5 mg via INTRAVENOUS

## 2021-10-12 MED ORDER — PALONOSETRON 0.25 MG/5 ML IV SOLN
.25 mg | Freq: Once | INTRAVENOUS | 0 refills | Status: CP
Start: 2021-10-12 — End: ?
  Administered 2021-10-12: 15:00:00 0.25 mg via INTRAVENOUS

## 2021-10-12 MED ORDER — SODIUM CHLORIDE 0.9 % IV SOLP
1000 mL | Freq: Once | INTRAVENOUS | 0 refills | Status: CP
Start: 2021-10-12 — End: ?
  Administered 2021-10-12: 15:00:00 1000 mL via INTRAVENOUS

## 2021-10-12 MED ORDER — FLUOROURACIL IV AMB PUMP
2400 mg/m2 | Freq: Once | INTRAVENOUS | 0 refills | Status: CP
Start: 2021-10-12 — End: ?
  Administered 2021-10-12 (×4): 3624 mg via INTRAVENOUS

## 2021-10-12 MED ORDER — MAGNESIUM SULFATE IN D5W 1 GRAM/100 ML IV PGBK (CC ONLY)
1 g | Freq: Once | INTRAVENOUS | 0 refills | Status: CP
Start: 2021-10-12 — End: ?
  Administered 2021-10-12: 15:00:00 1 g via INTRAVENOUS

## 2021-10-12 MED ORDER — IRINOTECAN IVPB
110 mg/m2 | Freq: Once | INTRAVENOUS | 0 refills | Status: CP
Start: 2021-10-12 — End: ?
  Administered 2021-10-12 (×3): 166.2 mg via INTRAVENOUS

## 2021-10-12 NOTE — Patient Instructions
[name]  Chemotherapy Instructions    Vicki Buchanan 10/12/2021    Chemotherapy Drugs:      MVASI and Folfiri    Call Immediately to report the following:  Unexplained bleeding or bleeding that will not stop  Difficulty swallowing  Shortness of breath, wheezing, or trouble breathing  Rapid, irregular heartbeat; chest pain  Dizziness, lightheadedness  Rash or cut that swells or turns red, feels hot or painful, or begin to ooze  Diarrhea   Uncontrolled nausea or vomiting  Fever of 100.4 F or higher, or chills    Important Phone Numbers:  [place] Number (answered 24 hours a day) [place] (appointments) [phone]  Social Worker [phone]

## 2021-10-12 NOTE — Progress Notes
Date of Service: 10/12/2021    SUBJECTIVE:             Reason for Visit:  Follow Up    Vicki Buchanan is a 66 y.o. 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  02/19/17 diagnostic laparoscopy with biopsies of the right diaphragm, right liver, right fallopian tube and left pelvis. 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  Planned for exploratory laparotomy with CRS/HIPEC in combination with a TAH/BSO in 03/2017.  Developed intestinal obstruction and taken to OR for CRS/HIPEC surgery; however multiple adhesions, omental cracking. CRS/HIPEC was aborted.  Immunotherapy on epacadostat?+ sirolimus?trial 12/24/17-05/21/18.  She stopped study drug on 05/21/18 for mitomycin HIPEC trial at [facility] [place].  On 05/28/18 diagnostic laparoscopy, evacuation of mucinous ascites 1.5L, peritoneal biopsy, laparoscopic HIPEC with mitomycin-C in [place] by Dr. Daryl Eastern.  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  Completed second laparoscopic HIPEC on 06/25/18.  Again 1.5L removed.  09/12/18 underwent debulking at [facility] in [place], [place].  Removed 98% by Dr. [name].  Discharged on 09/30/18.  She had L pleural effusion requiring chest tube drainage; intra-abd abscess 10/2018; discharged on 11/04/18 with drain and IV cefepime and Diflucan. Her ureteral stents were removed and ostomy relocated in 10/2018.  Hospitalized in 01/2019 for AKI 2/2 dehydration and likely PE (V/Q scan and trop elevation). CEA increasing again. Repeat CT CAP 03/20/19 with stable to slight increase in cancer and RUL nodule.  Discussed observation vs treatment. She chose observation and is considering repeat CRS + HIPEC via Dr. [name] in [place].  CT scans from 09/24/19 of the abdomen, without contrast, appear stable.  The peritoneal thickening, carcinomatosis, retroperitoneal lymph nodes, a small amount of fluid collection or trace ascites, appear to be stable compared to 3 months ago. 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  CT scan on 08/17/20 showed a right lung mass that is slightly increased from 11 mm to 14 mm. There are some new lung nodules that have developed, a small one in the left lung, a new nodule in the right lung. Inside the abdomen we are seeing continued increase in size of the peritoneal nodules. The largest measuring 14 mm compared to 12 mm.  The nodule lower in the pelvis increased from 16 mm to 21 mm.  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  TMB = 1.3.  S/p CRS w/ ostomy revision and tumor removal around stoma on 10/14/20.  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 [name] PA from [facility] in [place] called to relay their TB recommendations for patient.  Recommendations to proceed with FOLFIRI/Avastin with holding Avastin 6-8 weeks post-op. They also request if our office can repeat genomic testing.  Repeat TEMPUS from 10/14/20.  Pre-screen for KRAS G12D inhibitor trial, if not eligible will plan for FOLFIRI +/- bevacizumab.      Interval Hx 10/12/21:  Pt presents today for follow up after hospitalization. She is starting to feel much better. Her strength is better and she is finally eating better. The rectal pain is better as well with a new enema from the hospital. She denies any fevers or chills. The itching and yellowing of her eyes has gone away.       Review of Systems   Constitutional: Positive for activity change, appetite change, fatigue  and unexpected weight change. Negative for chills, diaphoresis and fever.   HENT: Negative for facial swelling, mouth sores, sore throat, trouble swallowing and voice change.    Eyes: Negative.  Negative for visual disturbance.   Respiratory: Negative for cough, chest tightness, shortness of breath and wheezing.    Cardiovascular: Negative.  Negative for chest pain, palpitations and leg swelling.   Gastrointestinal: Positive for diarrhea. Negative for abdominal distention, abdominal pain, anal bleeding, blood in stool, constipation, nausea, rectal pain and vomiting.   Genitourinary: Negative for difficulty urinating, dysuria and hematuria.   Musculoskeletal: Negative.  Negative for arthralgias, back pain, neck pain and neck stiffness.   Skin: Negative for color change, pallor, rash and wound.   Neurological: Positive for weakness. Negative for dizziness, light-headedness, numbness and headaches.   Hematological: Does not bruise/bleed easily.   Psychiatric/Behavioral: Negative for agitation. The patient is not nervous/anxious.        Medical History:   Diagnosis Date   ? Bundle branch block    ? Cancer [facility])    ? Cancer of appendix [facility])      Surgical History:   Procedure Laterality Date   ? CESAREAN SECTION  1981   ? HX CHOLECYSTECTOMY  1995   ? LAPAROSCOPY  02/2017    biopsy peritoneal   ? COLONOSCOPY DIAGNOSTIC WITH SPECIMEN COLLECTION BY BRUSHING/ WASHING - FLEXIBLE N/A 03/19/2017    Performed by Everardo All, MD at [facility] ENDO   ? COLONOSCOPY WITH BIOPSY - FLEXIBLE  03/19/2017    Performed by Everardo All, MD at [facility] ENDO   ? EXPLORATORY LAPAROTOMY, DIVERTING LOOP ILEOSTOMY N/A 04/03/2017    Performed by Loralie Champagne, MD at [facility] OR   ? INSERTION TUNNELED CENTRAL VENOUS CATHETER - AGE 36 YEARS AND OVER Left 04/23/2017    Performed by Loralie Champagne, MD at [facility] OR   ? CYSTOURETHROSCOPY WITH INDWELLING URETERAL STENT INSERTION Bilateral 04/09/2018    Performed by Earl Many, MD at [facility] OR   ? CYSTOURETHROSCOPY WITH URETERAL CATHETERIZATION WITH/ WITHOUT IRRIGATION/ INSTILLATION/ URETEROPYELOGRAPHY Bilateral 04/09/2018    Performed by Earl Many, MD at [facility] OR   ? CYSTOURETHROSCOPY WITH INDWELLING URETERAL STENT EXCHANGE (RIGHT 6 x 26cm / LEFT 6 x 28cm) Bilateral 08/08/2018    Performed by Earl Many, MD at [facility] OR   ? RETROGRADE UROGRAPHY WITH/ WITHOUT KUB Bilateral 08/08/2018    Performed by Earl Many, MD at [facility] OR   ? ENDOSCOPIC RETROGRADE CHOLANGIOPANCREATOGRAPHY WITH SPHINCTEROTOMY/ PAPILLOTOMY N/A 09/16/2021    Performed by Vertell Novak, MD at [facility] ENDO   ? ESOPHAGOGASTRODUODENOSCOPY WITH ENDOSCOPIC ULTRASOUND EXAMINATION - FLEXIBLE  09/16/2021    Performed by Vertell Novak, MD at [facility] ENDO   ? ENDOSCOPIC RETROGRADE CHOLANGIOPANCREATOGRAPHY WITH PLACEMENT ENDOSCOPIC STENT INTO BILIARY/ PANCREATIC DUCT - EACH STENT  09/16/2021    Performed by Vertell Novak, MD at [facility] ENDO   ? COLONOSCOPY DIAGNOSTIC WITH SPECIMEN COLLECTION BY BRUSHING/ WASHING - FLEXIBLE N/A 09/26/2021    Performed by Vertell Novak, MD at [facility] OR   ? ENDOSCOPIC RETROGRADE CHOLANGIOPANCREATOGRAPHY WITH PLACEMENT ENDOSCOPIC STENT INTO BILIARY/ PANCREATIC DUCT - EACH STENT  09/29/2021    Performed by Eliott Nine, MD at [facility] ENDO   ? ILEOSTOMY OR JEJUNOSTOMY Right    ? TUNNELED VENOUS PORT PLACEMENT Left    ? URETER STENT PLACEMENT Bilateral      Family History   Problem Relation Age of Onset   ? Diabetes Mother    ? Cancer Father    ?  Diabetes Brother    ? Cancer-Lung Paternal Aunt    ? Cancer-Breast Maternal Grandmother    ? Diabetes Paternal Grandmother      Social History     Socioeconomic History   ? Marital status: Married   Tobacco Use   ? Smoking status: Never   ? Smokeless tobacco: Never   Vaping Use   ? Vaping Use: Never used   Substance and Sexual Activity   ? Alcohol use: No     Comment: rarely   ? Drug use: No         OBJECTIVE:         ? acetaminophen (TYLENOL) 325 mg tablet Take two tablets by mouth every 6 hours as needed.   ? acetate-propionate-butyrate 60-30-40 mM rectal suspension (COMPOUND) Insert or Apply 60 mL to rectal area as directed twice daily.   ? aluminum-magnesium hydroxide-simethicone (MAALOX MAXIMUM STRENGTH) 400-400-40 mg/5 mL suspension Take 30 mL by mouth every 4 hours as needed.   ? amLODIPine (NORVASC) 10 mg tablet Take one tablet by mouth daily. (Patient taking differently: Take one tablet by mouth every morning.)   ? calcium carbonate (TUMS) 500 mg (200 mg elemental calcium) chewable tablet Chew one tablet by mouth every 6 hours as needed.   ? carvediloL (COREG) 6.25 mg tablet Take one tablet by mouth twice daily. Take with food.  Hold if systolic blood pressure < 110   ? Cetirizine (ZYRTEC) 10 mg cap Take one capsule by mouth as Needed.   ? ciprofloxacin (CIPRO) 250 mg tablet Take one tablet by mouth every 48 hours.   ? cyclobenzaprine (FLEXERIL) 5 mg tablet Take one tablet by mouth three times daily as needed.   ? dexAMETHasone (DECADRON) 4 mg tablet 1 tab by mouth with breakfast and afternoon snack on days 2 & 3 following each chemotherapy cycle. Do not take after 6 pm to avoid insomnia.   ? diphenoxylate-atropine (LOMOTIL) 2.5-0.025 mg tablet Take one tablet by mouth four times daily as needed for Diarrhea.   ? famotidine (PEPCID AC) 10 mg tablet Take one tablet by mouth twice daily.   ? hyoscyamine sulfate (LEVSIN/SL) 0.125 mg sublingual tablet Place one tablet under tongue four times daily as needed for Cramps.   ? LIDO/ANTACID/APAP 1:3:1 SUSP (MIXTURE COMPOUND) Take 30 mL (1 cup) by mouth every 4 hours as needed.   ? loperamide (IMODIUM A-D) 2 mg capsule Take one pill by mouth four times daily as needed   ? LORazepam (ATIVAN) 0.5 mg tablet Take 1-2 tabs by mouth every 6 hrs as needed N/V not controlled by Zofran or Compazine. May also use every 6 hrs as needed anxiety or at bedtime insomnia.   ? magnesium oxide 400 mg magnesium tablet Take one tablet by mouth twice daily. (Patient taking differently: Take one tablet by mouth twice daily as needed.)   ? ondansetron (ZOFRAN ODT) 8 mg rapid dissolve tablet Dissolve one tablet by mouth every 8 hours as needed for Nausea or Vomiting. Place on tongue to disolve.   ? potassium chloride (KLOR-CON SPRINKLE) 10 mEq capsule Take one capsule by mouth twice daily. If potassium < 3.5. Take with a meal and a full glass of water. ? prochlorperazine maleate (COMPAZINE) 10 mg tablet 1 tablet by mouth every 6 hours as needed for nausea not controlled by Zofran.   ? psyllium husk (METAMUCIL) 0.4 gram cap Take one capsule by mouth daily as needed.   ? sod chlor,sod bicarb/neti pot (SINUS WASH NETI  POT SINI) Use as directed   ? sodium chloride 0.9% (NS) 0.9 % 1,000 mL with potassium chloride 2 mEq/mL 20 mEq, magnesium sulfate 4 mEq/mL (50 %) 2 g IV infusion Administer 1L NS with K and 2g Mg once weekly via outpatient infusion.   ? traMADoL (ULTRAM) 50 mg tablet Take one tablet to two tablets by mouth daily as needed for Pain.     Vitals:    10/12/21 0830   BP: (!) 160/93   BP Source: Arm, Left Upper   Pulse: 80   Temp: 36.7 ?C (98.1 ?F)   Resp: 16   SpO2: 100%   TempSrc: Temporal   PainSc: Zero   Weight: 49.7 kg (109 lb 9.6 oz)     Body mass index is 19.41 kg/m?Marland Kitchen     Pain Score: Zero       Pain Addressed:  N/A    Patient Evaluated for a Clinical Trial: Patient not eligible for a treatment trial (including not needing treatment, needs palliative care, in remission).     [place] Cooperative Oncology Group performance status is 1, Restricted in physically strenuous activity but ambulatory and able to carry out work of a light or sedentary nature, e.g., light house work, office work.       Physical Exam  Vitals reviewed.   Constitutional:       General: She is not in acute distress.     Appearance: Normal appearance. She is well-developed. She is not ill-appearing, toxic-appearing or diaphoretic.   HENT:      Head: Normocephalic and atraumatic.      Nose: Nose normal. No rhinorrhea.      Mouth/Throat:      Mouth: Mucous membranes are moist. Mucous membranes are not pale. No oral lesions.      Pharynx: Oropharynx is clear. No oropharyngeal exudate or posterior oropharyngeal erythema.      Tonsils: No tonsillar abscesses.   Eyes:      General: No scleral icterus.        Right eye: No discharge.         Left eye: No discharge.      Extraocular Movements: Extraocular movements intact.      Conjunctiva/sclera: Conjunctivae normal.      Pupils: Pupils are equal, round, and reactive to light.   Cardiovascular:      Rate and Rhythm: Normal rate and regular rhythm.      Pulses: Normal pulses.      Heart sounds: Normal heart sounds. No murmur heard.     No gallop.   Pulmonary:      Effort: Pulmonary effort is normal. No respiratory distress.      Breath sounds: Normal breath sounds. No stridor. No wheezing, rhonchi or rales.   Chest:      Chest wall: No tenderness.      Comments: Port site WNL  Abdominal:      General: A surgical scar is present. Bowel sounds are normal. There is no distension.      Palpations: There is no mass.      Tenderness: There is no abdominal tenderness. There is no guarding or rebound.      Hernia: No hernia is present.          Comments: Firmness in epigastric area.    Musculoskeletal:         General: No swelling, tenderness, deformity or signs of injury. Normal range of motion.      Cervical back: Normal range  of motion and neck supple. No rigidity. No muscular tenderness.      Right lower leg: No edema.      Left lower leg: No edema.   Lymphadenopathy:      Cervical: No cervical adenopathy.   Skin:     General: Skin is warm and dry.      Coloration: Skin is not jaundiced or pale.      Findings: No bruising, erythema, lesion or rash.   Neurological:      General: No focal deficit present.      Mental Status: She is alert and oriented to person, place, and time. Mental status is at baseline.      Cranial Nerves: No cranial nerve deficit.      Motor: No weakness.      Coordination: Coordination normal.      Gait: Gait normal.   Psychiatric:         Mood and Affect: Mood normal.         Behavior: Behavior normal.         Thought Content: Thought content normal.         Judgment: Judgment normal.            CBC w/DIFF  CBC with Diff Latest Ref Rng & Units 10/12/2021 10/04/2021 10/03/2021 10/02/2021 10/01/2021   WBC 4.5 - 11.0 K/UL 11.8(H) 15.4(H) 17.6(H) 25.5(H) 18.7(H)   RBC 4.0 - 5.0 M/UL 3.00(L) 3.58(L) 3.38(L) 3.70(L) 3.13(L)   HGB 12.0 - 15.0 GM/DL 10.1(L) 12.2 11.5(L) 12.5 10.7(L)   HCT 36 - 45 % 30.9(L) 37.2 35.1(L) 38.7 32.3(L)   MCV 80 - 100 FL 103.1(H) 104.0(H) 103.9(H) 104.5(H) 103.2(H)   MCH 26 - 34 PG 33.5 34.0 33.9 33.6 34.1(H)   MCHC 32.0 - 36.0 G/DL 81.1 91.4 78.2 95.6 21.3   RDW 11 - 15 % 14.1 14.4 14.4 14.5 14.2   PLT 150 - 400 K/UL 410(H) 342 319 373 293   MPV 7 - 11 FL 7.8 9.3 9.1 9.3 9.3   NEUT 41 - 77 % 56 68 73 79(H) 79(H)   ANC 1.8 - 7.0 K/UL 6.70 10.56(H) 12.76(H) 20.25(H) 14.62(H)   LYMA 24 - 44 % 21(L) 15(L) 12(L) 11(L) 10(L)   ALYM 1.0 - 4.8 K/UL 2.40 2.38 2.17 2.88 1.94   MONA 4 - 12 % 15(H) 12 12 8 11    AMONO 0 - 0.80 K/UL 1.70(H) 1.77(H) 2.12(H) 1.92(H) 2.09(H)   EOSA 0 - 5 % 7(H) 4 3 2  0   AEOS 0 - 0.45 K/UL 0.80(H) 0.55(H) 0.52(H) 0.39 0.02   BASA 0 - 2 % 1 1 0 0 0   ABAS 0 - 0.20 K/UL 0.10 0.13 0.06 0.04 0.06     Comprehensive Metabolic Profile  CMP Latest Ref Rng & Units 10/12/2021 10/04/2021 10/04/2021 10/03/2021 10/02/2021   NA 137 - 147 MMOL/L 134(L) 141 141 141 142   K 3.5 - 5.1 MMOL/L 4.5 3.3(L) 3.4(L) 3.6 3.6   CL 98 - 110 MMOL/L 106 94(L) 93(L) 102 101   CO2 21 - 30 MMOL/L 20(L) 35(H) 32(H) 28 30   GAP 3 - 12 8 12  16(H) 11 11   BUN 7 - 25 MG/DL 18 08(M) 24 25 57(Q)   CR 0.4 - 1.00 MG/DL 4.69(G) 2.95(M) 8.41(L) 1.68(H) 1.70(H)   GLUX 70 - 100 MG/DL 244(W) 102(V) 253(G) 644(I) 102(H)   CA 8.5 - 10.6 MG/DL 8.8 8.9 9.5 9.0 9.4   TP 6.0 - 8.0 G/DL  7.4 - 8.1(H) 7.3 7.8   ALB 3.5 - 5.0 G/DL 3.1(L) - 3.7 3.3(L) 3.6   ALKP 25 - 110 U/L 544(H) - 468(H) 440(H) 587(H)   ALT 7 - 56 U/L 55 - 48 54 72(H)   TBILI 0.3 - 1.2 MG/DL 1.6(X) - 2.2(H) 2.0(H) 2.4(H)   GFR >60 mL/min - - - - -   GFRAA >60 mL/min - - - - -     Tumor Markers  Lab Results   Component Value Date    CEA 451.0 (H) 09/28/2021    CEA 470.1 (H) 08/31/2021    CEA 456.4 (H) 08/17/2021    CEA 436.3 (H) 08/03/2021    CEA 486.4 (H) 07/20/2021    CEA 515.0 (H) 07/06/2021    CEA 521.6 (H) 06/22/2021    CEA 620.8 (H) 06/08/2021    CEA 520.8 (H) 05/25/2021    CEA 629.6 (H) 05/11/2021    CA199 <1 09/28/2021    CA199 <1 08/31/2021    CA199 <1 08/17/2021    CA199 <1 08/03/2021    CA199 <1 07/20/2021    CA199 <1 07/06/2021    CA199 <1 06/22/2021    CA199 <1 06/08/2021    CA199 <1 05/25/2021    CA199 <1 05/11/2021    CA125 41 (H) 01/21/2018    CA125 42 (H) 11/21/2017    CA125 46 (H) 11/05/2017    CA125 41 (H) 10/22/2017    CA125 34 10/08/2017    CA125 34 09/24/2017    CA125 31 09/10/2017    CA125 29 08/27/2017    CA125 34 08/15/2017    CA125 39 (H) 07/30/2017           Imaging:  - 04/27/21 CT CAP without contrast  CHEST:   1. ?No significant change in small indeterminate anterior epiphrenic lymph nodes. No new thoracic lymphadenopathy.   2. No significant change to further mild increase in size of pulmonary and pleural metastases.   3. Increasing nodular and tree-in-bud opacities in the lingula and development of mild focal consolidation in the left lower lobe, indeterminate and may be infectious/inflammatory nodules or potentially pulmonary metastases. ?     ABDOMEN AND PELVIS:   1. ?Grossly unchanged extensive peritoneal carcinomatosis.   2. No significant change in small residual indeterminate retroperitoneal lymph nodes. No discrete new abdominopelvic lymphadenopathy.   3. Grossly unchanged right anterior abdominal wall metastases.     GI ENDO CATH BIL & PANC DUCT  This order has been auto finalized and does not contain a result.  CT ABD/PELV WO CONTRAST  Narrative: CT ABDOMEN AND PELVIS    Clinical Indication:  Elevated liver enzymes. History of metastatic appendiceal cancer with external decompression of CBD.    Technique: Multiple contiguous axial CT images were obtained through the abdomen and pelvis without IV contrast. Post processing coronal and sagittal reconstruction images were made from the axial images.     IV contrast: None.  Bowel contrast:  None    Comparison: MRI abdomen 09/07/2021, CT abdomen pelvis 07/26/2021, and prior other multiple CT exams.    FINDINGS:    Limited evaluation without the use of IV contrast which includes the viscera and vasculature.    Lower Thorax: No pleural effusion. Stable few prominent anterior diaphragmatic lymph nodes.     Liver and Biliary system: Right hepatic lobe cyst. Extensive perihepatic metastatic disease from patient's known appendiceal carcinoma with associated hepatic surface scalloping as well as parenchymal invasion.    CBD stent in  place, with superior end of the stent at CBD bifurcation. There is moderate intrahepatic biliary dilatation, which has increased since 07/26/2021. There appears to be stricturing of right common bile duct immediately superior to the stent. Right hepatic as well as left hepatic ducts measure up to 1.4 cm. Minimal pneumobilia.    Spleen: Surgically absent.    Adrenal Glands and Kidneys: Normal adrenal glands. Renal cysts. Interval development of mild bilateral hydronephrosis, with transition point being proximal to mid ureters on either side.    Pancreas and Retroperitoneum: Extensive peripancreatic metastatic disease with mass effect on pancreas. No retroperitoneal lymphadenopathy by size, while noting numerous nonenlarged lymph nodes, stable since prior exam dated 07/26/2021.    Aorta and Major Vessels: Normal caliber abdominal aorta and iliac arteries with mild atherosclerotic changes.     Bowel, Mesentery, and Peritoneal space: Status post right lower quadrant ostomy. There are no dilated loops of small bowel or colon. No pneumoperitoneum. Trace ascites. No mesenteric lymphadenopathy.    Extensive peritoneal metastases containing calcifications, not significantly changed since 07/26/2021.    Pelvis: Mildly distended urinary bladder. No pelvic lymphadenopathy.    Abdominal Wall and Osseous Structures: No suspicious bony lesion. Stable metastatic disease involving right-sided abdominal wall musculature and subcutaneous fatty tissue.  Impression: Status post CBD stenting, with interval mild increase in intrahepatic biliary dilatation. It appears to be stricturing of right hepatic duct just distal to the superior end of stent.    Interval development of mild hydronephrosis bilaterally, new since 09/07/2021 MRI exam. Transition point appears to be intact proximal to mid ureters on either side.    Stable peritoneal carcinomatosis. Stable mild anterior diaphragmatic lymphadenopathy.    Stable infiltrative metastatic disease involving the right abdominal wall.     Finalized by Dustin Flock, MD on 09/29/2021 7:37 AM. Dictated by Dustin Flock, MD on 09/29/2021 7:20 AM.        ASSESSMENT AND PLAN:    1. Metastatic appendiceal carcinoma. Signet ring adenocarcinoma, MSS, with peritoneal carcinomatosis with  pseudomyxoma peritonei.  02/19/17 diagnostic laparoscopy with biopsies of the right diaphragm, right liver, right fallopian tube and left pelvis. 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  Planned for exploratory laparotomy with CRS/HIPEC in combination with a TAH/BSO in 03/2017.  Developed intestinal obstruction and taken to OR for CRS/HIPEC surgery; however multiple adhesions, omental cracking. CRS/HIPEC was aborted.  Immunotherapy on epacadostat?+ sirolimus?trial 12/24/17-05/21/18.  She stopped study drug on 05/21/18 for mitomycin HIPEC trial at [facility] [place].  On 05/28/18 diagnostic laparoscopy, evacuation of mucinous ascites 1.5L, peritoneal biopsy, laparoscopic HIPEC with mitomycin-C in [place] by Dr. Daryl Eastern.  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  Completed second laparoscopic HIPEC on 06/25/18.  Again 1.5L removed.  09/12/18 underwent debulking at [facility] [place], [place].  Removed 98% by Dr. [name].  Discharged on 09/30/18.  She had L pleural effusion requiring chest tube drainage; intra-abd abscess 10/2018; discharged on 11/04/18 with drain and IV cefepime and Diflucan. Her ureteral stents were removed and ostomy relocated in 10/2018.  Hospitalized in 01/2019 for AKI 2/2 dehydration and likely PE (V/Q scan and trop elevation). CEA increasing again. Repeat CT CAP 03/20/19 with stable to slight increase in cancer and RUL nodule.  Discussed observation vs treatment. She chose observation and is considering repeat CRS + HIPEC via Dr. [name] in [place].  CT scans from 09/24/19 of the abdomen, without contrast, appear stable.  The peritoneal thickening, carcinomatosis, retroperitoneal lymph nodes, a small amount of fluid collection  or trace ascites, appear to be stable compared to 3 months ago. 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  CT scan on 08/17/20 showed a right lung mass that is slightly increased from 11 mm to 14 mm. There are some new lung nodules that have developed, a small one in the left lung, a new nodule in the right lung. Inside the abdomen we are seeing continued increase in size of the peritoneal nodules. The largest measuring 14 mm compared to 12 mm.  The nodule lower in the pelvis increased from 16 mm to 21 mm.  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  TMB = 1.3.  S/p CRS w/ ostomy revision and tumor removal around stoma on 10/14/20.  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 [name] PA from [facility] in [place] called to relay their TB recommendations for patient.  Recommendations to proceed with FOLFIRI/Avastin with holding Avastin 6-8 weeks post-op. They also request if our office can repeat genomic testing.  Repeat TEMPUS from 10/14/20.  Pre-screen for KRAS G12D inhibitor trial, if not eligible will plan for FOLFIRI +/- bevacizumab. Tolerated C1 FOLFIRI but more diarrhea and N/V after C2. Decreased irinotecan to 130 mg/m2 due to diarrhea. S/p C4, with bevacizumab added on. CT scan 02/16/21 abdominal wall spots are under control and stable. There is a slight increase in size of the lung nodules. The radiologist is measuring 10 mm to 8 mm in 11/2020. The other right lung nodule is more or less the same at 15 mm. CT scans with stable disease, mild increase in size of pulmonary metastases (specifically the right upper lobe nodule 2.2cm, previously 1.5cm). There is also some increasing nodular and tree-in-bud opacities in the lingula and development of mild focal consolidation in the left lower lobe, indeterminate but may be related to recent COVID infection vs pulmonary metastases. Given overall stability and down-trending CEA, we will plan to continue with the current treatment regimen for now.  Dr. Dorita Fray with Radiation Oncology reviewed imaging (already received 50Gy to right lung 3/1-05/25/20) and will continue to monitor.   CT scan on 08/03/2021 shows the cancer inside the abdomen and the abdominal wall metastasis is remaining the same compared to the scans done in 04/2021. The lung metastasis appears slightly larger by about 2 mm. Abnormal labs, even on repeat. Chemo held. Ultrasound reveals moderate ductal dilatation. Refer to GI for ERCP. Continued elevation of liver enzymes and bilirubin. MRI MRCP performed on 09/07/2021 shows extrinsic compression of the common bile duct causing biliary dilatation, as well as both intrahepatic ducts on the right and left side.    Plan 10/12/21:  She was in the hospital for 5-6 days. Her stent was extended and she received antibiotics.   The bilirubin has improved. She is feeling good again, no fevers.   CT scan in the hospital showed stable disease.   Proceed with FOLFIRI and bev C16 today.   Rather than see her in 4 weeks, we will check on her again in 2 weeks prior to next cycle.   We will move CT scans to September since she had one in the hospital.   Stent eval is 9/7, so defer chemo that week to 9/13.   Continue supportive measures. Pt to call with any questions, concerns or symptoms at any time and we can see her sooner.     2. Elevated creatinine, s/p  ureteral stent. Her ureteral stents were removed 10/2018. Encouraged hydration. S/p IVF 1L NS and 1g Mg weekly x 4 weeks. Referred to Nephrology as Cr is improving with IVF. She is following with Dr. Marjie Skiff in Nephrology. Cr 2.48 after GI virus. Decreased irinotecan to 130 mg/m2. Continue IV fluids 3 times a week locally, as we anticipate more diarrhea with irinotecan. Creatinine was greater than 4 with infection. Cr 2.09 today. Has seen urology for chronic infections. Is going on chronic antibiotics to control. Now on 250 cipro every other day. Continue IVF closer to home. Better today after IVF in hospital. Monitor.     3. Diarrhea. Better with Lomotil daily. Continue IV fluids 3 times a week locally. Increased her Lomotil to 2 tablets 4 times daily. She will continue to take Metamucil to bulk up her stools. She had a day of decreased stool output but was able to flush the Metamucil out with an enema. Bowels are back to baseline now. Continue to take Imodium as needed, okay to increase to 2 tabs at a time as needed. Better with advanced diet. Obstruction with too much fiber. Better with break in chemo. Monitor. IVF as needed throughout week. Improved. Continue Imodium and Lomotil as needed.     4. Nausea. From chemo. Worse after possible GI virus. Tried Compazine. Resolved now that infections have cleared. Chemo as above, continue antiemetics, call if not controlled. Less with last chemo. Better with break in chemo and controlled UTI.     5. Fatigue. Improved now that infections are better. More with on-going chemo. Improved with break. Dose reduce as above. Resolving.      6. Hypomagnesemia. Due to large volume fluid loss. 1.4 today. Replace in treatment.      7. Urinary tract infection. Now managed by urology. On suppressive antibiotics cipro 250 mg every other day. Thinks she might have symptoms again and has follow up with her urologist.     8. Elevated LFTs. May be irinotecan, tylenol, cipro. Hold tylenol. Continue antibiotics. Improved after holding tylenol. Now elevated again with bilirubin 2.7, AST 66 and ALT 106. Some improvement after IVF but bilirubin still elevated at 2.3. ultrasound of abdomen to assess for biliary obstruction/dilation. This revealed moderate intrahepatic bile duct dilatation. 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 Refer to GI for possible ERCP. Stent placed. Now bili higher at 4.1 and LFTs higher. White count elevated. Admitted to hospital for repeat ERCP. They were able to extend stent, no PTC. Bilirubin now 1.4. ok to resume treatment. Repeat scope 9/7. Call with worsening symptoms.       Turner Daniels, APRN-NP            The patient and family were allowed to ask questions and voice concerns; these were addressed to the best of our ability. They expressed understanding of what was explained to them, and they agreed with the present plan. RTC in 2 weeks with labs to see a provider. Patient has the phone numbers for the [facility] and was instructed on how to contact us with any questions or concerns. My collaborating physician on this case is Dr. Josiah Lobo.

## 2021-10-12 NOTE — Progress Notes
CHEMO NOTE  Verified chemo consent signed and in chart.    Blood return positive via: Port (Single)    BSA and dose double checked (agree with orders as written) with: yes - see MAR    Labs/applicable tests checked: CBC, Comprehensive Metabolic Panel (CMP) and Blood Pressure - (OK to Tx with BP 152/97 and TBil 1.4 Per TORB Lanelle Bal, APRN)    Chemo regimen: Drug/cycle/day C16 D1    bevacizumab-awwb (MVASI) 260.5 mg in sodium chloride 0.9% (NS) 110.42 mL IVPB     irinotecan (CAMPTOSAR) 166.2 mg in dextrose 5% (D5W) 508.31 mL IVPB     fluorouraciL (ADRUCIL) 3,624 mg in sodium chloride 0.9% (NS) 92 mL IV amb pump    Rate verified and armband double check with second RN: yes    Patient education offered and stated understanding. Denies questions at this time.      Patient arrived to [place] treatment for MVASI and Folfiri. Pt seen in clinic today by Lanelle Bal, APRN. Pt reports all needs addressed by provider, denies new concerns at this time. VS stable. PAC flushed with positive blood return noted. Parameters met for treatment (OK to Tx with BP 152/97 and TBil 1.4 Per TORB Lanelle Bal, APRN). Premedications, IVFs, IV Mag, and chemotherapy initiated per treatment plan. Treatment complete at 13:41, with brisk blood return, 20 mL D5W flush pre/post Irinotecan, 20 mL NS flush pre/post Atropine and pre Adrucil CADD pump connected to pt's port. All clamps open, all connections secured, and pump face reads RUN, now infusing home. Pt tolerated without adverse event. Schedule reviewed with patient, uses MyChart. Pt left clinic ambulatory without further questions or concerns. - Phill Myron, RN

## 2021-10-13 ENCOUNTER — Encounter: Admit: 2021-10-13 | Discharge: 2021-10-13 | Payer: MEDICARE

## 2021-10-22 ENCOUNTER — Encounter: Admit: 2021-10-22 | Discharge: 2021-10-22 | Payer: MEDICARE

## 2021-10-22 DIAGNOSIS — C786 Secondary malignant neoplasm of retroperitoneum and peritoneum: Secondary | ICD-10-CM

## 2021-10-24 ENCOUNTER — Encounter: Admit: 2021-10-24 | Discharge: 2021-10-24 | Payer: MEDICARE

## 2021-10-25 ENCOUNTER — Encounter: Admit: 2021-10-25 | Discharge: 2021-10-25 | Payer: MEDICARE

## 2021-10-25 DIAGNOSIS — C801 Malignant (primary) neoplasm, unspecified: Secondary | ICD-10-CM

## 2021-10-25 DIAGNOSIS — I454 Nonspecific intraventricular block: Secondary | ICD-10-CM

## 2021-10-25 DIAGNOSIS — C181 Malignant neoplasm of appendix: Secondary | ICD-10-CM

## 2021-10-25 NOTE — Telephone Encounter
Received message from Dr. Eldred Manges nurse Maddie Loe that pt needs stent exchange this week due to jaundice & labs. Reviewed with Dr. Ebony Hail & schedulers notified. Pt will be moved from 9/7 to 8/10 with Dr. Ebony Hail.

## 2021-10-26 ENCOUNTER — Encounter: Admit: 2021-10-26 | Discharge: 2021-10-26 | Payer: MEDICARE

## 2021-10-27 ENCOUNTER — Encounter: Admit: 2021-10-27 | Discharge: 2021-10-27 | Payer: MEDICARE

## 2021-10-27 ENCOUNTER — Ambulatory Visit: Admit: 2021-10-27 | Discharge: 2021-10-27 | Payer: MEDICARE

## 2021-10-27 DIAGNOSIS — C249 Malignant neoplasm of biliary tract, unspecified: Secondary | ICD-10-CM

## 2021-10-27 DIAGNOSIS — I454 Nonspecific intraventricular block: Secondary | ICD-10-CM

## 2021-10-27 DIAGNOSIS — Z4659 Encounter for fitting and adjustment of other gastrointestinal appliance and device: Secondary | ICD-10-CM

## 2021-10-27 DIAGNOSIS — C181 Malignant neoplasm of appendix: Secondary | ICD-10-CM

## 2021-10-27 DIAGNOSIS — C801 Malignant (primary) neoplasm, unspecified: Secondary | ICD-10-CM

## 2021-10-27 MED ORDER — ARTIFICIAL TEARS (PF) SINGLE DOSE DROPS GROUP
OPHTHALMIC | 0 refills | Status: DC
Start: 2021-10-27 — End: 2021-10-27
  Administered 2021-10-27: 14:00:00 2 [drp] via OPHTHALMIC

## 2021-10-27 MED ORDER — ONDANSETRON HCL (PF) 4 MG/2 ML IJ SOLN
INTRAVENOUS | 0 refills | Status: DC
Start: 2021-10-27 — End: 2021-10-27
  Administered 2021-10-27: 14:00:00 4 mg via INTRAVENOUS

## 2021-10-27 MED ORDER — SUCCINYLCHOLINE CHLORIDE 20 MG/ML IJ SOLN
INTRAVENOUS | 0 refills | Status: DC
Start: 2021-10-27 — End: 2021-10-27
  Administered 2021-10-27: 14:00:00 80 mg via INTRAVENOUS

## 2021-10-27 MED ORDER — PHENYLEPHRINE HCL IN 0.9% NACL 1 MG/10 ML (100 MCG/ML) IV SYRG
INTRAVENOUS | 0 refills | Status: DC
Start: 2021-10-27 — End: 2021-10-27
  Administered 2021-10-27: 15:00:00 150 ug via INTRAVENOUS
  Administered 2021-10-27 (×2): 100 ug via INTRAVENOUS

## 2021-10-27 MED ORDER — CEFTRIAXONE 1 GRAM IJ SOLR
INTRAVENOUS | 0 refills | Status: DC
Start: 2021-10-27 — End: 2021-10-27
  Administered 2021-10-27: 15:00:00 1 g via INTRAVENOUS

## 2021-10-27 MED ORDER — LIDOCAINE (PF) 200 MG/10 ML (2 %) IJ SYRG
INTRAVENOUS | 0 refills | Status: DC
Start: 2021-10-27 — End: 2021-10-27
  Administered 2021-10-27: 14:00:00 50 mg via INTRAVENOUS

## 2021-10-27 MED ORDER — FENTANYL CITRATE (PF) 50 MCG/ML IJ SOLN
INTRAVENOUS | 0 refills | Status: DC
Start: 2021-10-27 — End: 2021-10-27
  Administered 2021-10-27: 14:00:00 25 ug via INTRAVENOUS
  Administered 2021-10-27: 14:00:00 50 ug via INTRAVENOUS
  Administered 2021-10-27: 15:00:00 25 ug via INTRAVENOUS

## 2021-10-27 MED ORDER — PROPOFOL INJ 10 MG/ML IV VIAL
INTRAVENOUS | 0 refills | Status: DC
Start: 2021-10-27 — End: 2021-10-27
  Administered 2021-10-27: 14:00:00 130 mg via INTRAVENOUS

## 2021-10-27 MED ORDER — DEXAMETHASONE SODIUM PHOSPHATE 4 MG/ML IJ SOLN
INTRAVENOUS | 0 refills | Status: DC
Start: 2021-10-27 — End: 2021-10-27
  Administered 2021-10-27: 14:00:00 4 mg via INTRAVENOUS

## 2021-10-27 MED ADMIN — IOHEXOL 300 MG IODINE/ML IV SOLN [79156]: 20 mL | INTRAMUSCULAR | @ 15:00:00 | Stop: 2021-10-27 | NDC 00407141361

## 2021-10-27 MED ADMIN — LACTATED RINGERS IV SOLP [4318]: 1000.000 mL | INTRAVENOUS | @ 14:00:00 | Stop: 2021-10-27 | NDC 00338011704

## 2021-10-27 NOTE — Anesthesia Post-Procedure Evaluation
Post-Anesthesia Evaluation    Name: Vicki Buchanan      MRN: [id number]     DOB: 01-11-1956     Age: 66 y.o.     Sex: female   __________________________________________________________________________     Procedure Information     Anesthesia Start Date/Time: 10/27/21 0857    Procedures:       ENDOSCOPIC RETROGRADE CHOLANGIOPANCREATOGRAPHY with plastic stent exchange      ENDOSCOPIC RETROGRADE CHOLANGIOPANCREATOGRAPHY WITH ABLATION TUMOR/ POLYP/ OTHER LESION      ENDOSCOPIC RETROGRADE CHOLANGIOPANCREATOGRAPHY WITH REMOVAL CALCULI/ DEBRIS FROM BILIARY/ PANCREATIC DUCT    Location: ENDO 1 / ENDO/GI    Surgeons: Eliott Nine, MD          Post-Anesthesia Vitals      Vitals Value Taken Time   BP 130/77 10/27/21 1050   Temp 36.5 C (97.7 F) 10/27/21 1026   Pulse 83 10/27/21 1050   Respirations 25 PER MINUTE 10/27/21 1050   SpO2 98 % 10/27/21 1050   O2 Device None (Room air) 10/27/21 1026   ABP     ART BP           Post Anesthesia Evaluation Note    Evaluation location: Pre/Post  Patient participation: recovered; patient participated in evaluation  Level of consciousness: alert    Pain score: 0  Pain management: adequate    Hydration: normovolemia  Temperature: 36.0C - 38.4C  Airway patency: adequate    Perioperative Events       Post-op nausea and vomiting: no PONV    Postoperative Status  Cardiovascular status: hemodynamically stable  Respiratory status: spontaneous ventilation        Perioperative Events  There were no known notable events for this encounter.

## 2021-10-27 NOTE — Anesthesia Pre-Procedure Evaluation
Anesthesia Pre-Procedure Evaluation    Name: Vicki Buchanan      MRN: [id number]     DOB: Dec 17, 1955     Age: 66 y.o.     Sex: female   __________________________________________________________________________     Procedure Date: 10/27/2021   Procedure: Procedure(s):  ENDOSCOPIC RETROGRADE CHOLANGIOPANCREATOGRAPHY with plastic stent exchange     Physical Assessment  Vital Signs (last filed in past 24 hours):  BP: 121/80 (08/10 0738)  Temp: 36.8 ?C (98.3 ?F) (08/10 4540)  Pulse: 81 (08/10 0738)  Respirations: 12 PER MINUTE (08/10 0738)  SpO2: 98 % (08/10 0738)  O2 Device: None (Room air) (08/10 0738)  Height: 157.5 cm (5' 2) (08/10 9811)  Weight: 50.8 kg (112 lb) (08/10 9147)      Patient History  Allergies   Allergen Reactions   ? Indomethacin SEE COMMENTS     Pain for several days after suppository post ERCP. Tolerated IV but not PR   ? Shrimp SEE COMMENTS     Eye irritation. Namely blood vessel issues in the eyes         Current Medications    Medication Directions   acetaminophen (TYLENOL) 325 mg tablet Take two tablets by mouth every 6 hours as needed.   acetate-propionate-butyrate 60-30-40 mM rectal suspension (COMPOUND) Insert or Apply 60 mL to rectal area as directed twice daily.   aluminum-magnesium hydroxide-simethicone (MAALOX MAXIMUM STRENGTH) 400-400-40 mg/5 mL suspension Take 30 mL by mouth every 4 hours as needed.   amLODIPine (NORVASC) 10 mg tablet Take one tablet by mouth daily.  Patient taking differently: Take one tablet by mouth every morning.   calcium carbonate (TUMS) 500 mg (200 mg elemental calcium) chewable tablet Chew one tablet by mouth every 6 hours as needed.   carvediloL (COREG) 6.25 mg tablet Take one tablet by mouth twice daily. Take with food.  Hold if systolic blood pressure < 110   Cetirizine (ZYRTEC) 10 mg cap Take one capsule by mouth as Needed.   ciprofloxacin (CIPRO) 250 mg tablet Take one tablet by mouth every 48 hours.   cyclobenzaprine (FLEXERIL) 5 mg tablet Take one tablet by mouth three times daily as needed.   dexAMETHasone (DECADRON) 4 mg tablet 1 tab by mouth with breakfast and afternoon snack on days 2 & 3 following each chemotherapy cycle. Do not take after 6 pm to avoid insomnia.   diphenoxylate-atropine (LOMOTIL) 2.5-0.025 mg tablet Take one tablet by mouth four times daily as needed for Diarrhea.   famotidine (PEPCID AC) 10 mg tablet Take one tablet by mouth twice daily.   hyoscyamine sulfate (LEVSIN/SL) 0.125 mg sublingual tablet Place one tablet under tongue four times daily as needed for Cramps.   LIDO/ANTACID/APAP 1:3:1 SUSP (MIXTURE COMPOUND) Take 30 mL (1 cup) by mouth every 4 hours as needed.   loperamide (IMODIUM A-D) 2 mg capsule Take one pill by mouth four times daily as needed   LORazepam (ATIVAN) 0.5 mg tablet Take 1-2 tabs by mouth every 6 hrs as needed N/V not controlled by Zofran or Compazine. May also use every 6 hrs as needed anxiety or at bedtime insomnia.   magnesium oxide 400 mg magnesium tablet Take one tablet by mouth twice daily.  Patient taking differently: Take one tablet by mouth twice daily as needed.   ondansetron (ZOFRAN ODT) 8 mg rapid dissolve tablet Dissolve one tablet by mouth every 8 hours as needed for Nausea or Vomiting. Place on tongue to disolve.   potassium chloride (KLOR-CON SPRINKLE)  10 mEq capsule Take one capsule by mouth twice daily. If potassium < 3.5. Take with a meal and a full glass of water.   prochlorperazine maleate (COMPAZINE) 10 mg tablet 1 tablet by mouth every 6 hours as needed for nausea not controlled by Zofran.   psyllium husk (METAMUCIL) 0.4 gram cap Take one capsule by mouth daily as needed.   sod chlor,sod bicarb/neti pot (SINUS WASH NETI POT SINI) Use as directed   sodium chloride 0.9% (NS) 0.9 % 1,000 mL with potassium chloride 2 mEq/mL 20 mEq, magnesium sulfate 4 mEq/mL (50 %) 2 g IV infusion Administer 1L NS with K and 2g Mg once weekly via outpatient infusion.   traMADoL (ULTRAM) 50 mg tablet Take one tablet to two tablets by mouth daily as needed for Pain.         Review of Systems/Medical History      Patient summary reviewed  Nursing notes reviewed  Pertinent labs reviewed    PONV Screening: Non-smoker and Female sex  No history of anesthetic complications  No family history of anesthetic complications      Airway - negative        Previous grade 1 view with MAC 3      Pulmonary - negative         Lung metastases s/p radiation      Cardiovascular         Exercise tolerance: >4 METS      Beta Blocker therapy: No      Beta blockers within 24 hours: n/a      Hypertension, well controlled              GI/Hepatic/Renal             GERD, poorly controlled      Liver disease (biliary obstruction):         Renal disease: CKD Stage 3 (eGFR 30-59)           S/p HIPEC, 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        Neuro/Psych - negative        Musculoskeletal - negative        No back pain        Endocrine/Other           No anemia        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    Abdominal Findings:       Not obese    Neurological Findings:       Alert and oriented x 3    Constitutional findings:       No acute distress       Diagnostic Tests  Hematology:   Lab Results   Component Value  Date    HGB 10.1 10/12/2021    HCT 30.9 10/12/2021    PLTCT 410 10/12/2021    WBC 11.8 10/12/2021    NEUT 56 10/12/2021    ANC 6.70 10/12/2021    LYMPH 50 08/31/2021    ALC 2.40 10/12/2021    MONA 15 10/12/2021    AMC 1.70 10/12/2021    EOSA 7 10/12/2021 ABC 0.10 10/12/2021    MCV 103.1 10/12/2021    MCH 33.5 10/12/2021    MCHC 32.5 10/12/2021    MPV 7.8 10/12/2021    RDW 14.1 10/12/2021         General Chemistry:   Lab Results   Component Value Date    NA 134 10/12/2021    K 4.5 10/12/2021    CL 106 10/12/2021    CO2 20 10/12/2021    GAP 8 10/12/2021    BUN 18 10/12/2021    CR 1.45 10/12/2021    GLU 114 10/12/2021    CA 8.8 10/12/2021    ALBUMIN 3.1 10/12/2021    MG 1.1 10/12/2021    TOTBILI 1.4 10/12/2021    PO4 3.3 11/26/2018      Coagulation:   Lab Results   Component Value Date    PTT 28.7 07/10/2018    INR 1.3 07/10/2018         Anesthesia Plan    ASA score: 3   Plan: general  Induction method: intravenous  NPO status: acceptable      Informed Consent  Anesthetic plan and risks discussed with patient.  Use of blood products discussed with patient  Blood Consent: consented      Plan discussed with: anesthesiologist and CRNA.    PAC Plan      Alerts

## 2021-10-28 ENCOUNTER — Encounter: Admit: 2021-10-28 | Discharge: 2021-10-28 | Payer: MEDICARE

## 2021-10-28 DIAGNOSIS — C801 Malignant (primary) neoplasm, unspecified: Secondary | ICD-10-CM

## 2021-10-28 DIAGNOSIS — C181 Malignant neoplasm of appendix: Secondary | ICD-10-CM

## 2021-10-28 DIAGNOSIS — I454 Nonspecific intraventricular block: Secondary | ICD-10-CM

## 2021-10-30 ENCOUNTER — Encounter: Admit: 2021-10-30 | Discharge: 2021-10-30 | Payer: MEDICARE

## 2021-11-01 ENCOUNTER — Encounter: Admit: 2021-11-01 | Discharge: 2021-11-01 | Payer: MEDICARE

## 2021-11-01 NOTE — Telephone Encounter
Spoke with patient about rescheduling her upcoming appointment with Sandrea Hughs PA-C. She was scheduled earlier than needed, verified that she was doing well and had no concerns at this time.   She was agreeable to pushing appointment to November. Appointment rescheduled, patient verbalized understanding and appreciation.

## 2021-11-02 ENCOUNTER — Encounter: Admit: 2021-11-02 | Discharge: 2021-11-02 | Payer: MEDICARE

## 2021-11-08 ENCOUNTER — Encounter: Admit: 2021-11-08 | Discharge: 2021-11-08 | Payer: MEDICARE

## 2021-11-08 DIAGNOSIS — C786 Secondary malignant neoplasm of retroperitoneum and peritoneum: Secondary | ICD-10-CM

## 2021-11-09 ENCOUNTER — Encounter: Admit: 2021-11-09 | Discharge: 2021-11-09 | Payer: MEDICARE

## 2021-11-10 ENCOUNTER — Encounter: Admit: 2021-11-10 | Discharge: 2021-11-10 | Payer: MEDICARE

## 2021-11-10 DIAGNOSIS — I454 Nonspecific intraventricular block: Secondary | ICD-10-CM

## 2021-11-10 DIAGNOSIS — C786 Secondary malignant neoplasm of retroperitoneum and peritoneum: Secondary | ICD-10-CM

## 2021-11-10 DIAGNOSIS — C181 Malignant neoplasm of appendix: Secondary | ICD-10-CM

## 2021-11-10 DIAGNOSIS — C801 Malignant (primary) neoplasm, unspecified: Secondary | ICD-10-CM

## 2021-11-10 LAB — MAGNESIUM: MAGNESIUM: 1.6 mg/dL — ABNORMAL LOW (ref 1.6–2.6)

## 2021-11-10 LAB — CBC AND DIFF
ABSOLUTE BASO COUNT: 0.1 K/UL (ref 0–0.20)
ABSOLUTE EOS COUNT: 0.1 K/UL (ref 0–0.45)
ABSOLUTE LYMPH COUNT: 4.1 K/UL (ref 1.0–4.8)
ABSOLUTE MONO COUNT: 1.8 K/UL — ABNORMAL HIGH (ref 0–0.80)
ABSOLUTE NEUTROPHIL: 6.7 K/UL (ref 1.8–7.0)
BASOPHILS %: 1 % — ABNORMAL LOW (ref >60)
EOSINOPHILS %: 1 % (ref 0–5)
LYMPHOCYTES %: 32 % — ABNORMAL HIGH (ref 24–44)
MCH: 33 pg (ref 26–34)
MCHC: 33 g/dL (ref 32.0–36.0)
MCV: 101 FL — ABNORMAL HIGH (ref 80–100)
MONOCYTES %: 14 % — ABNORMAL HIGH (ref 4–12)
MPV: 8 FL — ABNORMAL HIGH (ref 7–11)
NEUTROPHILS %: 52 % (ref 41–77)
PLATELET COUNT: 508 K/UL — ABNORMAL HIGH (ref 150–400)
RDW: 15 % (ref 11–15)
WBC COUNT: 12 K/UL — ABNORMAL HIGH (ref 4.5–11.0)

## 2021-11-10 LAB — CEA(CARCINOEMBRYONIC AG): CEA: 681 ng/mL — ABNORMAL HIGH (ref <3.0)

## 2021-11-10 LAB — CA19.9: CA 19-9: <1 U/mL — ABNORMAL LOW (ref <35)

## 2021-11-10 LAB — COMPREHENSIVE METABOLIC PANEL: SODIUM: 139 MMOL/L (ref 137–147)

## 2021-11-10 MED ORDER — PALONOSETRON 0.25 MG/5 ML IV SOLN
.25 mg | Freq: Once | INTRAVENOUS | 0 refills | Status: CP
Start: 2021-11-10 — End: ?
  Administered 2021-11-10: 16:00:00 0.25 mg via INTRAVENOUS

## 2021-11-10 MED ORDER — BEVACIZUMAB-AWWB IVPB
5 mg/kg | Freq: Once | INTRAVENOUS | 0 refills | Status: CP
Start: 2021-11-10 — End: ?
  Administered 2021-11-10 (×2): 260.5 mg via INTRAVENOUS

## 2021-11-10 MED ORDER — MAGNESIUM SULFATE IN D5W 1 GRAM/100 ML IV PGBK (CC ONLY)
1 g | Freq: Once | INTRAVENOUS | 0 refills | Status: CP
Start: 2021-11-10 — End: ?
  Administered 2021-11-10: 16:00:00 1 g via INTRAVENOUS

## 2021-11-10 MED ORDER — ATROPINE 0.4 MG/ML IJ SOLN
.4 mg | INTRAVENOUS | 0 refills | Status: AC | PRN
Start: 2021-11-10 — End: ?
  Administered 2021-11-10: 17:00:00 0.4 mg via INTRAVENOUS

## 2021-11-10 MED ORDER — IRINOTECAN IVPB
110 mg/m2 | Freq: Once | INTRAVENOUS | 0 refills | Status: CP
Start: 2021-11-10 — End: ?
  Administered 2021-11-10 (×3): 166.2 mg via INTRAVENOUS

## 2021-11-10 MED ORDER — SODIUM CHLORIDE 0.9 % IV SOLP
1000 mL | Freq: Once | INTRAVENOUS | 0 refills | Status: CP
Start: 2021-11-10 — End: ?
  Administered 2021-11-10 (×2): 1000 mL via INTRAVENOUS

## 2021-11-10 MED ORDER — DEXAMETHASONE 6 MG PO TAB
12 mg | Freq: Once | ORAL | 0 refills | Status: CP
Start: 2021-11-10 — End: ?
  Administered 2021-11-10: 16:00:00 12 mg via ORAL

## 2021-11-10 MED ORDER — FLUOROURACIL IV AMB PUMP
2400 mg/m2 | Freq: Once | INTRAVENOUS | 0 refills | Status: CP
Start: 2021-11-10 — End: ?
  Administered 2021-11-10 (×4): 3624 mg via INTRAVENOUS

## 2021-11-10 NOTE — Progress Notes
CHEMO NOTE  Verified chemo consent signed and in chart. Yes    Blood return positive via: Port (Single)    BSA and dose double checked (agree with orders as written) with: yes see mar dual sign off     Labs/applicable tests checked: CBC and Comprehensive Metabolic Panel (CMP)    Chemo regimen: Drug/cycle/day C 18 D 1 Mvasi, Folfirni and add on Mag and Iv fluids     Rate verified and armband double check with second RN: yes    Patient education offered and stated understanding. Denies questions at this time.    Pt seen and assessed in clinic. Saline and Mag added to treatment plan. Saline and Maginfused and completed. MVASI given. Irotecan also given with Atropine given prior per pt request. Infusion completed. 5 FU pump hooked to pt al clamps unclamped and pump states RUN . Pt left tx area in good condition

## 2021-11-12 ENCOUNTER — Encounter: Admit: 2021-11-12 | Discharge: 2021-11-12 | Payer: MEDICARE

## 2021-11-12 DIAGNOSIS — C786 Secondary malignant neoplasm of retroperitoneum and peritoneum: Secondary | ICD-10-CM

## 2021-11-12 MED ORDER — SODIUM CHLORIDE 0.9 % IV SOLP
1000 mL | Freq: Once | INTRAVENOUS | 0 refills | Status: CP
Start: 2021-11-12 — End: ?
  Administered 2021-11-12: 17:00:00 1000 mL via INTRAVENOUS

## 2021-11-12 NOTE — Progress Notes
Patient here for CADD Unhook. Patient reports she is feeling well and denies any questions or concers with pump. Ambulatory Pump completed infusion upon arrival to appointment. Patient tolerated IVF without issue. PAC de-accessed per protocol. Patient left ambulatory with husband in good condition all questions and concerns addressed.

## 2021-11-24 ENCOUNTER — Encounter: Admit: 2021-11-24 | Discharge: 2021-11-24 | Payer: MEDICARE

## 2021-11-28 ENCOUNTER — Encounter: Admit: 2021-11-28 | Discharge: 2021-11-28 | Payer: MEDICARE

## 2021-11-28 DIAGNOSIS — C189 Malignant neoplasm of colon, unspecified: Secondary | ICD-10-CM

## 2021-11-28 DIAGNOSIS — R062 Wheezing: Secondary | ICD-10-CM

## 2021-11-28 DIAGNOSIS — C786 Secondary malignant neoplasm of retroperitoneum and peritoneum: Secondary | ICD-10-CM

## 2021-11-28 DIAGNOSIS — I454 Nonspecific intraventricular block: Secondary | ICD-10-CM

## 2021-11-28 DIAGNOSIS — C181 Malignant neoplasm of appendix: Secondary | ICD-10-CM

## 2021-11-28 DIAGNOSIS — C801 Malignant (primary) neoplasm, unspecified: Secondary | ICD-10-CM

## 2021-11-28 LAB — CBC AND DIFF
ABSOLUTE BASO COUNT: 0 K/UL (ref 0–0.20)
ABSOLUTE EOS COUNT: 0.3 K/UL (ref 0–0.45)
ABSOLUTE LYMPH COUNT: 3 K/UL (ref 1.0–4.8)
ABSOLUTE MONO COUNT: 1.6 K/UL — ABNORMAL HIGH (ref 0–0.80)
ABSOLUTE NEUTROPHIL: 2.3 K/UL (ref 1.8–7.0)
BASOPHILS %: 1 % — ABNORMAL LOW (ref >60)
EOSINOPHILS %: 4 % (ref 0–5)
MCH: 33 pg (ref 26–34)
MCHC: 33 g/dL (ref 32.0–36.0)
MCV: 101 FL — ABNORMAL HIGH (ref 80–100)
MONOCYTES %: 22 % — ABNORMAL HIGH (ref 4–12)
MPV: 7.8 FL — ABNORMAL HIGH (ref 7–11)
NEUTROPHILS %: 32 % — ABNORMAL LOW (ref 41–77)
PLATELET COUNT: 402 K/UL — ABNORMAL HIGH (ref 150–400)
RDW: 14 % (ref 11–15)
WBC COUNT: 7.2 K/UL (ref 4.5–11.0)

## 2021-11-28 LAB — CA19.9: CA 19-9: <1 U/mL — ABNORMAL LOW (ref <35)

## 2021-11-28 LAB — CEA(CARCINOEMBRYONIC AG): CEA: 761 ng/mL — ABNORMAL HIGH (ref <3.0)

## 2021-11-28 LAB — COMPREHENSIVE METABOLIC PANEL: SODIUM: 138 MMOL/L (ref 137–147)

## 2021-11-28 LAB — MAGNESIUM: MAGNESIUM: 1.4 mg/dL — ABNORMAL LOW (ref 1.6–2.6)

## 2021-11-28 MED ORDER — SODIUM CHLORIDE 0.9 % IV SOLP
1000 mL | Freq: Once | INTRAVENOUS | 0 refills | Status: CP
Start: 2021-11-28 — End: ?
  Administered 2021-11-28: 17:00:00 1000 mL via INTRAVENOUS

## 2021-11-28 MED ORDER — DEXAMETHASONE 6 MG PO TAB
12 mg | Freq: Once | ORAL | 0 refills | Status: CP
Start: 2021-11-28 — End: ?
  Administered 2021-11-28: 18:00:00 12 mg via ORAL

## 2021-11-28 MED ORDER — MAGNESIUM SULFATE IN D5W 1 GRAM/100 ML IV PGBK (CC ONLY)
1 g | Freq: Once | INTRAVENOUS | 0 refills | Status: CP
Start: 2021-11-28 — End: ?
  Administered 2021-11-28: 18:00:00 1 g via INTRAVENOUS

## 2021-11-28 MED ORDER — FLUOROURACIL IV AMB PUMP
2400 mg/m2 | Freq: Once | INTRAVENOUS | 0 refills | Status: CP
Start: 2021-11-28 — End: ?
  Administered 2021-11-28 (×3): 3624 mg via INTRAVENOUS

## 2021-11-28 MED ORDER — ATROPINE 0.4 MG/ML IJ SOLN
.4 mg | INTRAVENOUS | 0 refills | Status: AC | PRN
Start: 2021-11-28 — End: ?
  Administered 2021-11-28: 19:00:00 0.4 mg via INTRAVENOUS

## 2021-11-28 MED ORDER — PALONOSETRON 0.25 MG/5 ML IV SOLN
.25 mg | Freq: Once | INTRAVENOUS | 0 refills | Status: CP
Start: 2021-11-28 — End: ?
  Administered 2021-11-28: 17:00:00 0.25 mg via INTRAVENOUS

## 2021-11-28 MED ORDER — IRINOTECAN IVPB
110 mg/m2 | Freq: Once | INTRAVENOUS | 0 refills | Status: CP
Start: 2021-11-28 — End: ?
  Administered 2021-11-28 (×3): 166.2 mg via INTRAVENOUS

## 2021-11-28 NOTE — Progress Notes
Date of Service: 11/28/2021    SUBJECTIVE:             Reason for Visit:  Follow Up    Vicki Buchanan is a 66 y.o. 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  02/19/17 diagnostic laparoscopy with biopsies of the right diaphragm, right liver, right fallopian tube and left pelvis. 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  Planned for exploratory laparotomy with CRS/HIPEC in combination with a TAH/BSO in 03/2017.  Developed intestinal obstruction and taken to OR for CRS/HIPEC surgery; however multiple adhesions, omental cracking. CRS/HIPEC was aborted.  Immunotherapy on epacadostat?+ sirolimus?trial 12/24/17-05/21/18.  She stopped study drug on 05/21/18 for mitomycin HIPEC trial at [facility] [place].  On 05/28/18 diagnostic laparoscopy, evacuation of mucinous ascites 1.5L, peritoneal biopsy, laparoscopic HIPEC with mitomycin-C in [place] by Dr. Daryl Eastern.  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  Completed second laparoscopic HIPEC on 06/25/18.  Again 1.5L removed.  09/12/18 underwent debulking at [facility] in [place], [place].  Removed 98% by Dr. [name].  Discharged on 09/30/18.  She had L pleural effusion requiring chest tube drainage; intra-abd abscess 10/2018; discharged on 11/04/18 with drain and IV cefepime and Diflucan. Her ureteral stents were removed and ostomy relocated in 10/2018.  Hospitalized in 01/2019 for AKI 2/2 dehydration and likely PE (V/Q scan and trop elevation). CEA increasing again. Repeat CT CAP 03/20/19 with stable to slight increase in cancer and RUL nodule.  Discussed observation vs treatment. She chose observation and is considering repeat CRS + HIPEC via Dr. [name] in [place].  CT scans from 09/24/19 of the abdomen, without contrast, appear stable.  The peritoneal thickening, carcinomatosis, retroperitoneal lymph nodes, a small amount of fluid collection or trace ascites, appear to be stable compared to 3 months ago. 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  CT scan on 08/17/20 showed a right lung mass that is slightly increased from 11 mm to 14 mm. There are some new lung nodules that have developed, a small one in the left lung, a new nodule in the right lung. Inside the abdomen we are seeing continued increase in size of the peritoneal nodules. The largest measuring 14 mm compared to 12 mm.  The nodule lower in the pelvis increased from 16 mm to 21 mm.  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  TMB = 1.3.  S/p CRS w/ ostomy revision and tumor removal around stoma on 10/14/20.  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 [name] PA from [facility] in [place] called to relay their TB recommendations for patient.  Recommendations to proceed with FOLFIRI/Avastin with holding Avastin 6-8 weeks post-op. They also request if our office can repeat genomic testing.  Repeat TEMPUS from 10/14/20.  Pre-screen for KRAS G12D inhibitor trial, if not eligible will plan for FOLFIRI +/- bevacizumab.        Interval Hx 11/28/21:  She presents for follow-up evaluation. She is doing well. She feels back to baseline and tolerated her last chemo well. She denies fevers or chills. She continues to get IVF and magnesium at home. Her ileostomy is stable. Her liver function is back to almost normal. Her urinary infection is better controlled again. She feels strong enough and good energy level. She had some elevation in her blood pressure and bevacizumab was  held. She was able to get her last couple of doses again and her BP is stable.          Review of Systems   Constitutional: Positive for fatigue. Negative for activity change, appetite change, chills, diaphoresis, fever and unexpected weight change.   HENT: Negative for facial swelling, mouth sores, sore throat, trouble swallowing and voice change.    Eyes: Negative.  Negative for visual disturbance.   Respiratory: Negative for cough, chest tightness, shortness of breath and wheezing.    Cardiovascular: Negative.  Negative for chest pain, palpitations and leg swelling.   Gastrointestinal: Positive for diarrhea. Negative for abdominal distention, abdominal pain, anal bleeding, blood in stool, constipation, nausea, rectal pain and vomiting.   Genitourinary: Negative for difficulty urinating, dysuria and hematuria.   Musculoskeletal: Negative.  Negative for arthralgias, back pain, neck pain and neck stiffness.   Skin: Negative for color change, pallor, rash and wound.   Neurological: Positive for weakness. Negative for dizziness, light-headedness, numbness and headaches.   Hematological: Does not bruise/bleed easily.   Psychiatric/Behavioral: Negative for agitation. The patient is not nervous/anxious.        Medical History:   Diagnosis Date   ? Bundle branch block    ? Cancer [facility])    ? Cancer of appendix (HCC)    ? Cancer of colon (HCC) 11/19     Surgical History:   Procedure Laterality Date   ? CESAREAN SECTION  1981   ? HX CHOLECYSTECTOMY  1995   ? LAPAROSCOPY  02/2017    biopsy peritoneal   ? COLONOSCOPY DIAGNOSTIC WITH SPECIMEN COLLECTION BY BRUSHING/ WASHING - FLEXIBLE N/A 03/19/2017    Performed by Everardo All, MD at [facility] ENDO   ? COLONOSCOPY WITH BIOPSY - FLEXIBLE  03/19/2017    Performed by Everardo All, MD at [facility] ENDO   ? EXPLORATORY LAPAROTOMY, DIVERTING LOOP ILEOSTOMY N/A 04/03/2017    Performed by Loralie Champagne, MD at [facility] OR   ? INSERTION TUNNELED CENTRAL VENOUS CATHETER - AGE 85 YEARS AND OVER Left 04/23/2017    Performed by Loralie Champagne, MD at [facility] OR   ? CYSTOURETHROSCOPY WITH INDWELLING URETERAL STENT INSERTION Bilateral 04/09/2018    Performed by Earl Many, MD at [facility] OR   ? CYSTOURETHROSCOPY WITH URETERAL CATHETERIZATION WITH/ WITHOUT IRRIGATION/ INSTILLATION/ URETEROPYELOGRAPHY Bilateral 04/09/2018    Performed by Earl Many, MD at [facility] OR   ? CYSTOURETHROSCOPY WITH INDWELLING URETERAL STENT EXCHANGE (RIGHT 6 x 26cm / LEFT 6 x 28cm) Bilateral 08/08/2018    Performed by Earl Many, MD at [facility] OR   ? RETROGRADE UROGRAPHY WITH/ WITHOUT KUB Bilateral 08/08/2018    Performed by Earl Many, MD at [facility] OR   ? ENDOSCOPIC RETROGRADE CHOLANGIOPANCREATOGRAPHY WITH SPHINCTEROTOMY/ PAPILLOTOMY N/A 09/16/2021    Performed by Vertell Novak, MD at [facility] ENDO   ? ESOPHAGOGASTRODUODENOSCOPY WITH ENDOSCOPIC ULTRASOUND EXAMINATION - FLEXIBLE  09/16/2021    Performed by Vertell Novak, MD at [facility] ENDO   ? ENDOSCOPIC RETROGRADE CHOLANGIOPANCREATOGRAPHY WITH PLACEMENT ENDOSCOPIC STENT INTO BILIARY/ PANCREATIC DUCT - EACH STENT  09/16/2021    Performed by Vertell Novak, MD at [facility] ENDO   ? COLONOSCOPY DIAGNOSTIC WITH SPECIMEN COLLECTION BY BRUSHING/ WASHING - FLEXIBLE N/A 09/26/2021    Performed by Vertell Novak, MD at [facility] OR   ? ENDOSCOPIC RETROGRADE CHOLANGIOPANCREATOGRAPHY WITH PLACEMENT ENDOSCOPIC STENT INTO BILIARY/ PANCREATIC DUCT - EACH STENT  09/29/2021    Performed by Eliott Nine, MD at [facility] ENDO   ?  ENDOSCOPIC RETROGRADE CHOLANGIOPANCREATOGRAPHY with plastic stent exchange N/A 10/27/2021    Performed by Eliott Nine, MD at [facility] ENDO   ? ENDOSCOPIC RETROGRADE CHOLANGIOPANCREATOGRAPHY WITH ABLATION TUMOR/ POLYP/ OTHER LESION  10/27/2021    Performed by Eliott Nine, MD at [facility] ENDO   ? ENDOSCOPIC RETROGRADE CHOLANGIOPANCREATOGRAPHY WITH REMOVAL CALCULI/ DEBRIS FROM BILIARY/ PANCREATIC DUCT  10/27/2021    Performed by Eliott Nine, MD at [facility] ENDO   ? HX HYSTERECTOMY  7/20   ? ILEOSTOMY OR JEJUNOSTOMY Right    ? PORTACATH PLACEMENT  1/20   ? PR PNCRTECT PROX STOT W/PANCREATOJEJUNOSTOMY  7/20   ? TUNNELED VENOUS PORT PLACEMENT Left    ? URETER STENT PLACEMENT Bilateral      Family History   Problem Relation Age of Onset   ? Diabetes Mother    ? Cancer Father    ? Diabetes Brother    ? Cancer-Lung Paternal Aunt    ? Cancer-Breast Maternal Grandmother    ? Diabetes Paternal Grandmother      Social History     Socioeconomic History   ? Marital status: Married   Tobacco Use   ? Smoking status: Never   ? Smokeless tobacco: Never   Vaping Use   ? Vaping Use: Never used   Substance and Sexual Activity   ? Alcohol use: No     Comment: rarely   ? Drug use: No         OBJECTIVE:         ? acetaminophen (TYLENOL) 325 mg tablet Take two tablets by mouth every 6 hours as needed.   ? acetate-propionate-butyrate 60-30-40 mM rectal suspension (COMPOUND) Insert or Apply 60 mL to rectal area as directed twice daily.   ? aluminum-magnesium hydroxide-simethicone (MAALOX MAXIMUM STRENGTH) 400-400-40 mg/5 mL suspension Take 30 mL by mouth every 4 hours as needed.   ? amLODIPine (NORVASC) 10 mg tablet Take one tablet by mouth daily. (Patient taking differently: Take one tablet by mouth every morning.)   ? calcium carbonate (TUMS) 500 mg (200 mg elemental calcium) chewable tablet Chew one tablet by mouth every 6 hours as needed.   ? carvediloL (COREG) 6.25 mg tablet Take one tablet by mouth twice daily. Take with food.  Hold if systolic blood pressure < 110   ? Cetirizine (ZYRTEC) 10 mg cap Take one capsule by mouth as Needed.   ? ciprofloxacin (CIPRO) 250 mg tablet Take one tablet by mouth every 48 hours.   ? cyclobenzaprine (FLEXERIL) 5 mg tablet Take one tablet by mouth three times daily as needed.   ? dexAMETHasone (DECADRON) 4 mg tablet 1 tab by mouth with breakfast and afternoon snack on days 2 & 3 following each chemotherapy cycle. Do not take after 6 pm to avoid insomnia.   ? diphenoxylate-atropine (LOMOTIL) 2.5-0.025 mg tablet Take one tablet by mouth four times daily as needed for Diarrhea.   ? famotidine (PEPCID AC) 10 mg tablet Take one tablet by mouth twice daily.   ? hyoscyamine sulfate (LEVSIN/SL) 0.125 mg sublingual tablet Place one tablet under tongue four times daily as needed for Cramps.   ? LIDO/ANTACID/APAP 1:3:1 SUSP (MIXTURE COMPOUND) Take 30 mL (1 cup) by mouth every 4 hours as needed.   ? loperamide (IMODIUM A-D) 2 mg capsule Take one pill by mouth four times daily as needed   ? LORazepam (ATIVAN) 0.5 mg tablet Take 1-2 tabs by mouth every 6 hrs as needed N/V not controlled by Zofran or Compazine. May also  use every 6 hrs as needed anxiety or at bedtime insomnia.   ? magnesium oxide 400 mg magnesium tablet Take one tablet by mouth twice daily. (Patient taking differently: Take one tablet by mouth twice daily as needed.)   ? ondansetron (ZOFRAN ODT) 8 mg rapid dissolve tablet Dissolve one tablet by mouth every 8 hours as needed for Nausea or Vomiting. Place on tongue to disolve.   ? potassium chloride (KLOR-CON SPRINKLE) 10 mEq capsule Take one capsule by mouth twice daily. If potassium < 3.5. Take with a meal and a full glass of water.   ? prochlorperazine maleate (COMPAZINE) 10 mg tablet 1 tablet by mouth every 6 hours as needed for nausea not controlled by Zofran.   ? psyllium husk (METAMUCIL) 0.4 gram cap Take one capsule by mouth daily as needed.   ? sod chlor,sod bicarb/neti pot (SINUS WASH NETI POT SINI) Use as directed   ? sodium chloride 0.9% (NS) 0.9 % 1,000 mL with potassium chloride 2 mEq/mL 20 mEq, magnesium sulfate 4 mEq/mL (50 %) 2 g IV infusion Administer 1L NS with K and 2g Mg once weekly via outpatient infusion.   ? traMADoL (ULTRAM) 50 mg tablet Take one tablet to two tablets by mouth daily as needed for Pain.     Vitals:    11/28/21 1021   BP: 116/73   BP Source: Arm, Right Upper   Pulse: 73   Temp: 36.5 ?C (97.7 ?F)   Resp: 16   SpO2: 100%   TempSrc: Temporal   PainSc: Zero   Weight: 50.1 kg (110 lb 6.4 oz)     Body mass index is 20.19 kg/m?Marland Kitchen     Pain Score: Zero       Pain Addressed:  N/A    Patient Evaluated for a Clinical Trial: Patient not eligible for a treatment trial (including not needing treatment, needs palliative care, in remission).     [place] Cooperative Oncology Group performance status is 1, Restricted in physically strenuous activity but ambulatory and able to carry out work of a light or sedentary nature, e.g., light house work, office work.       Physical Exam  Vitals reviewed.   Constitutional:       General: She is not in acute distress.     Appearance: Normal appearance. She is well-developed. She is not ill-appearing, toxic-appearing or diaphoretic.   HENT:      Head: Normocephalic and atraumatic.      Nose: Nose normal. No rhinorrhea.      Mouth/Throat:      Mouth: Mucous membranes are moist. Mucous membranes are not pale. No oral lesions.      Pharynx: Oropharynx is clear. No oropharyngeal exudate or posterior oropharyngeal erythema.      Tonsils: No tonsillar abscesses.   Eyes:      General: No scleral icterus.        Right eye: No discharge.         Left eye: No discharge.      Extraocular Movements: Extraocular movements intact.      Conjunctiva/sclera: Conjunctivae normal.      Pupils: Pupils are equal, round, and reactive to light.   Cardiovascular:      Rate and Rhythm: Normal rate and regular rhythm.      Pulses: Normal pulses.      Heart sounds: Normal heart sounds. No murmur heard.     No gallop.   Pulmonary:      Effort: Pulmonary  effort is normal. No respiratory distress.      Breath sounds: No stridor. Wheezing present. No rhonchi or rales.   Chest:      Chest wall: No tenderness.      Comments: Port site WNL  Abdominal:      General: A surgical scar is present. Bowel sounds are normal. There is no distension.      Palpations: There is no mass.      Tenderness: There is no abdominal tenderness. There is no guarding or rebound.      Hernia: No hernia is present.          Comments: Firmness in epigastric area.    Musculoskeletal:         General: No swelling, tenderness, deformity or signs of injury. Normal range of motion.      Cervical back: Normal range of motion and neck supple. No rigidity. No muscular tenderness.      Right lower leg: No edema.      Left lower leg: No edema.   Lymphadenopathy: Cervical: No cervical adenopathy.   Skin:     General: Skin is warm and dry.      Coloration: Skin is not jaundiced or pale.      Findings: No bruising, erythema, lesion or rash.   Neurological:      General: No focal deficit present.      Mental Status: She is alert and oriented to person, place, and time. Mental status is at baseline.      Cranial Nerves: No cranial nerve deficit.      Motor: No weakness.      Coordination: Coordination normal.      Gait: Gait normal.   Psychiatric:         Mood and Affect: Mood normal.         Behavior: Behavior normal.         Thought Content: Thought content normal.         Judgment: Judgment normal.            CBC w/DIFF      Latest Ref Rng & Units 11/28/2021     9:05 AM 11/10/2021     8:52 AM 10/12/2021     8:09 AM 10/04/2021     2:32 AM 10/03/2021     5:00 AM   CBC with Diff   White Blood Cells 4.5 - 11.0 K/UL 7.2  12.7  11.8  15.4  17.6    RBC 4.0 - 5.0 M/UL 2.85  3.08  3.00  3.58  3.38    Hemoglobin 12.0 - 15.0 GM/DL 9.7  16.1  09.6  04.5  11.5    Hematocrit 36 - 45 % 28.8  31.2  30.9  37.2  35.1    MCV 80 - 100 FL 101.0  101.4  103.1  104.0  103.9    MCH 26 - 34 PG 33.9  33.5  33.5  34.0  33.9    MCHC 32.0 - 36.0 G/DL 40.9  81.1  91.4  78.2  32.6    RDW 11 - 15 % 14.8  15.0  14.1  14.4  14.4    Platelet Count 150 - 400 K/UL 402  508  410  342  319    MPV 7 - 11 FL 7.8  8.0  7.8  9.3  9.1    Neutrophils 41 - 77 % 32  52  56  68  73    Absolute Neutrophil  Count 1.8 - 7.0 K/UL 2.30  6.70  6.70  10.56  12.76    Lymphocytes 24 - 44 % 41  32  21  15  12     Absolute Lymph Count 1.0 - 4.8 K/UL 3.00  4.10  2.40  2.38  2.17    Monocytes 4 - 12 % 22  14  15  12  12     Absolute Monocyte Count 0 - 0.80 K/UL 1.60  1.80  1.70  1.77  2.12    Eosinophils 0 - 5 % 4  1  7  4  3     Absolute Eosinophil Count 0 - 0.45 K/UL 0.30  0.10  0.80  0.55  0.52    Basophils 0 - 2 % 1  1  1  1   0    Absolute Basophil Count 0 - 0.20 K/UL 0.00  0.10  0.10  0.13  0.06      Comprehensive Metabolic Profile Latest Ref Rng & Units 11/28/2021     9:05 AM 11/10/2021     8:52 AM 10/12/2021     8:09 AM 10/04/2021    11:16 AM 10/04/2021     2:32 AM   CMP   Sodium 137 - 147 MMOL/L 138  139  134  141  141    Potassium 3.5 - 5.1 MMOL/L 3.9  3.4  4.5  3.3  3.4    Chloride 98 - 110 MMOL/L 101  97  106  94  93    CO2 21 - 30 MMOL/L 32  33  20  35  32    Anion Gap 3 - 12 5  9  8  12  16     Blood Urea Nitrogen 7 - 25 MG/DL 20  34  18  26  24     Creatinine 0.4 - 1.00 MG/DL 1.61  0.96  0.45  4.09  1.93    Glucose 70 - 100 MG/DL 88  811  914  782  956    Calcium 8.5 - 10.6 MG/DL 9.0  9.3  8.8  8.9  9.5    Total Protein 6.0 - 8.0 G/DL 7.1  7.5  7.4   8.1    Albumin 3.5 - 5.0 G/DL 3.5  3.6  3.1   3.7    Alk Phosphatase 25 - 110 U/L 228  245  544   468    ALT (SGPT) 7 - 56 U/L 13  22  55   48    Total Bilirubin 0.3 - 1.2 MG/DL 0.7  0.9  1.4   2.2      Tumor Markers  Lab Results   Component Value Date    CEA 761.7 (H) 11/28/2021    CEA 681.0 (H) 11/10/2021    CEA 482.6 (H) 10/12/2021    CEA 451.0 (H) 09/28/2021    CEA 470.1 (H) 08/31/2021    CEA 456.4 (H) 08/17/2021    CEA 436.3 (H) 08/03/2021    CEA 486.4 (H) 07/20/2021    CEA 515.0 (H) 07/06/2021    CEA 521.6 (H) 06/22/2021    CA199 <1 11/28/2021    CA199 <1 11/10/2021    CA199 <1 09/28/2021    CA199 <1 08/31/2021    CA199 <1 08/17/2021    CA199 <1 08/03/2021    CA199 <1 07/20/2021    CA199 <1 07/06/2021    CA199 <1 06/22/2021    CA199 <1 06/08/2021    CA125 41 (H) 01/21/2018  CA125 42 (H) 11/21/2017    CA125 46 (H) 11/05/2017    CA125 41 (H) 10/22/2017    CA125 34 10/08/2017    CA125 34 09/24/2017    CA125 31 09/10/2017    CA125 29 08/27/2017    CA125 34 08/15/2017    CA125 39 (H) 07/30/2017           Imaging:  - 04/27/21 CT CAP without contrast  CHEST:   1. ?No significant change in small indeterminate anterior epiphrenic lymph nodes. No new thoracic lymphadenopathy.   2. No significant change to further mild increase in size of pulmonary and pleural metastases.   3. Increasing nodular and tree-in-bud opacities in the lingula and development of mild focal consolidation in the left lower lobe, indeterminate and may be infectious/inflammatory nodules or potentially pulmonary metastases. ?     ABDOMEN AND PELVIS:   1. ?Grossly unchanged extensive peritoneal carcinomatosis.   2. No significant change in small residual indeterminate retroperitoneal lymph nodes. No discrete new abdominopelvic lymphadenopathy.   3. Grossly unchanged right anterior abdominal wall metastases.     GI ENDO CATH BIL & PANC DUCT  This order has been auto finalized and does not contain a result.        ASSESSMENT AND PLAN:    1. Metastatic appendiceal carcinoma. Signet ring adenocarcinoma, MSS, with peritoneal carcinomatosis with  pseudomyxoma peritonei.  02/19/17 diagnostic laparoscopy with biopsies of the right diaphragm, right liver, right fallopian tube and left pelvis. 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  Planned for exploratory laparotomy with CRS/HIPEC in combination with a TAH/BSO in 03/2017.  Developed intestinal obstruction and taken to OR for CRS/HIPEC surgery; however multiple adhesions, omental cracking. CRS/HIPEC was aborted.  Immunotherapy on epacadostat?+ sirolimus?trial 12/24/17-05/21/18.  She stopped study drug on 05/21/18 for mitomycin HIPEC trial at [facility] [place].  On 05/28/18 diagnostic laparoscopy, evacuation of mucinous ascites 1.5L, peritoneal biopsy, laparoscopic HIPEC with mitomycin-C in [place] by Dr. Daryl Eastern.  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  Completed second laparoscopic HIPEC on 06/25/18.  Again 1.5L removed.  09/12/18 underwent debulking at [facility] in [place], [place].  Removed 98% by Dr. [name].  Discharged on 09/30/18.  She had L pleural effusion requiring chest tube drainage; intra-abd abscess 10/2018; discharged on 11/04/18 with drain and IV cefepime and Diflucan. Her ureteral stents were removed and ostomy relocated in 10/2018. Hospitalized in 01/2019 for AKI 2/2 dehydration and likely PE (V/Q scan and trop elevation). CEA increasing again. Repeat CT CAP 03/20/19 with stable to slight increase in cancer and RUL nodule.  Discussed observation vs treatment. She chose observation and is considering repeat CRS + HIPEC via Dr. [name] in [place].  CT scans from 09/24/19 of the abdomen, without contrast, appear stable.  The peritoneal thickening, carcinomatosis, retroperitoneal lymph nodes, a small amount of fluid collection or trace ascites, appear to be stable compared to 3 months ago. 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  CT scan on 08/17/20 showed a right lung mass that is slightly increased from 11 mm to 14 mm. There are some new lung nodules that have developed, a small one in the left lung, a new nodule in the right lung. Inside the abdomen we are seeing continued increase in size of the peritoneal nodules. The largest measuring 14 mm compared to 12 mm.  The nodule lower in the pelvis increased from 16 mm to 21 mm.  Tempus testing in 2019 showed KRAS G12D mutation, GNAS mutation,  and SMAD2 mutation.  TMB = 1.3.  S/p CRS w/ ostomy revision and tumor removal around stoma on 10/14/20.  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 [name] PA from [facility] in [place] called to relay their TB recommendations for patient.  Recommendations to proceed with FOLFIRI/Avastin with holding Avastin 6-8 weeks post-op. They also request if our office can repeat genomic testing.  Repeat TEMPUS from 10/14/20.  Pre-screen for KRAS G12D inhibitor trial, if not eligible will plan for FOLFIRI +/- bevacizumab. Tolerated C1 FOLFIRI but more diarrhea and N/V after C2. Decreased irinotecan to 130 mg/m2 due to diarrhea. S/p C4, with bevacizumab added on. CT scan 02/16/21 abdominal wall spots are under control and stable. There is a slight increase in size of the lung nodules. The radiologist is measuring 10 mm to 8 mm in 11/2020. The other right lung nodule is more or less the same at 15 mm. CT scans with stable disease, mild increase in size of pulmonary metastases (specifically the right upper lobe nodule 2.2cm, previously 1.5cm). There is also some increasing nodular and tree-in-bud opacities in the lingula and development of mild focal consolidation in the left lower lobe, indeterminate but may be related to recent COVID infection vs pulmonary metastases. Given overall stability and down-trending CEA, we will plan to continue with the current treatment regimen for now.  Dr. Dorita Fray with Radiation Oncology reviewed imaging (already received 50Gy to right lung 3/1-05/25/20) and will continue to monitor.   CT scan on 08/03/2021 shows the cancer inside the abdomen and the abdominal wall metastasis is remaining the same compared to the scans done in 04/2021. The lung metastasis appears slightly larger by about 2 mm. Abnormal labs, even on repeat. Chemo held. Ultrasound reveals moderate ductal dilatation. Refer to GI for ERCP. Continued elevation of liver enzymes and bilirubin. MRI MRCP performed on 09/07/2021 shows extrinsic compression of the common bile duct causing biliary dilatation, as well as both intrahepatic ducts on the right and left side. CT scan 09/28/2021 shows the cancer was overall stable. However, the fact that it is causing biliary obstruction, ureter obstruction, and hydronephrosis tells [place] that clinically it is growing. There was mild hydronephrosis on both kidneys.      Plan 11/28/21:  Feeling good, ready for next chemo, cycle 18 of FOLFIRI, but because she has stent replacement in 3 weeks, will hold bevacizumab today and in 2 weeks.   RTC in 2 weeks with labs and visit for C19, no avastin.   In October, re-discuss decreasing her Irinotecan to every 4 weeks if no worsening of labs and/or symptoms.   Monitor tumor markers, increasing.   Continue IVF at home as needed.   Continue supportive measures. Pt to call with any questions, concerns or symptoms at any time and we can see her sooner.     2. Elevated creatinine, s/p ureteral stent. Her ureteral stents were removed 10/2018. Encouraged hydration. S/p IVF 1L NS and 1g Mg weekly x 4 weeks. Referred to Nephrology as Cr is improving with IVF. She is following with Dr. Marjie Skiff in Nephrology. Cr 2.48 after GI virus. Decreased irinotecan to 130 mg/m2. Continue IV fluids 3 times a week locally, as we anticipate more diarrhea with irinotecan. Creatinine was greater than 4 with infection. Cr 2.09 today. Has seen urology for chronic infections. Is going on chronic antibiotics to control. Now on 250 cipro every other day. Continue IVF closer to home.  A little higher today. Did not get fluids over the weekend.     3. Diarrhea. Better with Lomotil daily. Continue IV fluids 3 times a week locally. Increased her Lomotil to 2 tablets 4 times daily. She will continue to take Metamucil to bulk up her stools. She had a day of decreased stool output but was able to flush the Metamucil out with an enema. Bowels are back to baseline now. Continue to take Imodium as needed, okay to increase to 2 tabs at a time as needed. Better with advanced diet. Obstruction with too much fiber. Better with break in chemo. Monitor. IVF as needed throughout week. Improved. Continue Imodium and Lomotil as needed.     4. Nausea. From chemo. Worse after possible GI virus. Tried Compazine. Resolved now that infections have cleared. Chemo as above, continue antiemetics, call if not controlled. Less with last chemo. Better with break in chemo and controlled UTI.     5. Fatigue. Improved now that infections are better. More with on-going chemo. Improved with break. Resolving.      6. Hypomagnesemia. Due to large volume fluid loss. 1.4 today. Replace in treatment.      7. Urinary tract infection. Now managed by urology. On suppressive antibiotics cipro 250 mg every other day. Follow up with her urologist.     8. Elevated LFTs. May be irinotecan, tylenol, cipro. Hold tylenol. Continue antibiotics. Improved after holding tylenol. Now elevated again with bilirubin 2.7, AST 66 and ALT 106. Some improvement after IVF but bilirubin still elevated at 2.3. ultrasound of abdomen to assess for biliary obstruction/dilation. This revealed moderate intrahepatic bile duct dilatation. 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 Refer to GI for possible ERCP. Stent placed. Now bili higher at 4.1 and LFTs higher. White count elevated. Admitted to hospital for repeat ERCP. They were able to extend stent, no PTC. Bilirubin now 1.4. ok to resume treatment. Repeat scope 9/7. Now normal after stent adjustment. Replace next month.     9. Wheezing. On right side, denies cough or fever. She reports more allergies. Will get CXR to eval for fluid.       Turner Daniels, APRN-NP        The patient and family were allowed to ask questions and voice concerns; these were addressed to the best of our ability. They expressed understanding of what was explained to them, and they agreed with the present plan. RTC in 2 weeks with labs to see a provider. Patient has the phone numbers for the [facility] and was instructed on how to contact us with any questions or concerns. My collaborating physician on this case is Dr. Josiah Lobo.

## 2021-11-28 NOTE — Progress Notes
..  CHEMO NOTE  Verified chemo consent signed and in chart.    Blood return positive via: Port (Single)    BSA and dose double checked (agree with orders as written) with: yes; see MAR     Labs/applicable tests checked: CBC and Comprehensive Metabolic Panel (CMP)    Chemo regimen: C18D1 Folfiri    Rate verified and armband double check with second RN: yes    Patient education offered and stated understanding. Denies questions at this time.    .Patient arrived to clinic from [place] for scheduled Folfiri. Please refer to doctor's notes for further assessment details. Port flushed with positive blood return noted. Labs and vitals WDL for treatment. Patient received treatment; tolerated well. Port flushed and attached to ambulatory 5FU pump. Patient left clinic in stable condition.

## 2021-11-29 ENCOUNTER — Encounter: Admit: 2021-11-29 | Discharge: 2021-11-29 | Payer: MEDICARE

## 2021-12-02 ENCOUNTER — Encounter: Admit: 2021-12-02 | Discharge: 2021-12-02 | Payer: MEDICARE

## 2021-12-02 DIAGNOSIS — C181 Malignant neoplasm of appendix: Secondary | ICD-10-CM

## 2021-12-02 DIAGNOSIS — C189 Malignant neoplasm of colon, unspecified: Secondary | ICD-10-CM

## 2021-12-02 DIAGNOSIS — C801 Malignant (primary) neoplasm, unspecified: Secondary | ICD-10-CM

## 2021-12-02 DIAGNOSIS — I454 Nonspecific intraventricular block: Secondary | ICD-10-CM

## 2021-12-12 ENCOUNTER — Ambulatory Visit: Admit: 2021-12-12 | Discharge: 2021-12-12 | Payer: MEDICARE

## 2021-12-12 ENCOUNTER — Encounter: Admit: 2021-12-12 | Discharge: 2021-12-12 | Payer: MEDICARE

## 2021-12-12 DIAGNOSIS — C181 Malignant neoplasm of appendix: Secondary | ICD-10-CM

## 2021-12-12 DIAGNOSIS — C786 Secondary malignant neoplasm of retroperitoneum and peritoneum: Secondary | ICD-10-CM

## 2021-12-12 DIAGNOSIS — C801 Malignant (primary) neoplasm, unspecified: Secondary | ICD-10-CM

## 2021-12-12 DIAGNOSIS — C189 Malignant neoplasm of colon, unspecified: Secondary | ICD-10-CM

## 2021-12-12 DIAGNOSIS — I454 Nonspecific intraventricular block: Secondary | ICD-10-CM

## 2021-12-12 LAB — COMPREHENSIVE METABOLIC PANEL
ALBUMIN: 3.6 g/dL — ABNORMAL HIGH (ref 3.5–5.0)
CALCIUM: 9.1 mg/dL — ABNORMAL HIGH (ref <3.0)
EGFR: 18 mL/min — ABNORMAL LOW (ref >60)
GLUCOSE,PANEL: 136 mg/dL — ABNORMAL HIGH (ref 70–100)
POTASSIUM: 3.7 MMOL/L (ref 3.5–5.1)
SODIUM: 142 MMOL/L (ref 137–147)
TOTAL PROTEIN: 7 g/dL (ref 6.0–8.0)

## 2021-12-12 LAB — CBC AND DIFF
ABSOLUTE BASO COUNT: 0 K/UL (ref 0–0.20)
ABSOLUTE EOS COUNT: 0.2 K/UL (ref 0–0.45)
ABSOLUTE LYMPH COUNT: 2.7 K/UL (ref 1.0–4.8)
ABSOLUTE MONO COUNT: 1 K/UL — ABNORMAL HIGH (ref 0–0.80)
ABSOLUTE NEUTROPHIL: 2 K/UL (ref 1.8–7.0)
BASOPHILS %: 0 % (ref 0–2)
HEMATOCRIT: 28 % — ABNORMAL LOW (ref 36–45)
LYMPHOCYTES %: 45 % — ABNORMAL HIGH (ref 24–44)
MCHC: 33 g/dL — ABNORMAL LOW (ref 32.0–36.0)
MONOCYTES %: 18 % — ABNORMAL HIGH (ref 4–12)
MPV: 7.3 FL (ref 7–11)
NEUTROPHILS %: 34 % — ABNORMAL LOW (ref 41–77)
PLATELET COUNT: 367 K/UL — ABNORMAL HIGH (ref 150–400)
WBC COUNT: 5.9 K/UL — ABNORMAL LOW (ref 4.5–11.0)

## 2021-12-12 LAB — CA19.9: CA 19-9: <1 U/mL — ABNORMAL LOW (ref <35)

## 2021-12-12 MED ORDER — ATROPINE 0.4 MG/ML IJ SOLN
.4 mg | INTRAVENOUS | 0 refills | Status: AC | PRN
Start: 2021-12-12 — End: ?
  Administered 2021-12-12: 18:00:00 0.4 mg via INTRAVENOUS

## 2021-12-12 MED ORDER — DEXAMETHASONE 6 MG PO TAB
12 mg | Freq: Once | ORAL | 0 refills | Status: CP
Start: 2021-12-12 — End: ?
  Administered 2021-12-12: 18:00:00 12 mg via ORAL

## 2021-12-12 MED ORDER — ALTEPLASE 2 MG IK SOLR
2 mg | Freq: Once | INTRAMUSCULAR | 0 refills | Status: CP
Start: 2021-12-12 — End: ?
  Administered 2021-12-12: 16:00:00 2 mg via INTRAMUSCULAR

## 2021-12-12 MED ORDER — SODIUM CHLORIDE 0.9 % IV SOLP
1000 mL | Freq: Once | INTRAVENOUS | 0 refills | Status: CP
Start: 2021-12-12 — End: ?
  Administered 2021-12-12: 17:00:00 1000 mL via INTRAVENOUS

## 2021-12-12 MED ORDER — MAGNESIUM SULFATE IN WATER 2 GRAM/50 ML (4 %) IV PGBK
2 g | Freq: Once | INTRAVENOUS | 0 refills | Status: CP
Start: 2021-12-12 — End: ?
  Administered 2021-12-12: 17:00:00 2 g via INTRAVENOUS

## 2021-12-12 MED ORDER — PALONOSETRON 0.25 MG/5 ML IV SOLN
.25 mg | Freq: Once | INTRAVENOUS | 0 refills | Status: CP
Start: 2021-12-12 — End: ?
  Administered 2021-12-12: 17:00:00 0.25 mg via INTRAVENOUS

## 2021-12-12 MED ORDER — IRINOTECAN IVPB
110 mg/m2 | Freq: Once | INTRAVENOUS | 0 refills | Status: CP
Start: 2021-12-12 — End: ?
  Administered 2021-12-12 (×3): 166.2 mg via INTRAVENOUS

## 2021-12-12 MED ORDER — FLUOROURACIL IV AMB PUMP
2400 mg/m2 | Freq: Once | INTRAVENOUS | 0 refills | Status: CP
Start: 2021-12-12 — End: ?
  Administered 2021-12-12 (×4): 3624 mg via INTRAVENOUS

## 2021-12-12 NOTE — Progress Notes
CHEMO NOTE  Verified chemo consent signed and in chart.    Blood return positive via: Port (Single and Accessed)    BSA and dose double checked (agree with orders as written) with: yes     Labs/applicable tests checked: CBC and Comprehensive Metabolic Panel (CMP)    Chemo regimen: Drug/cycle/day FOLFIRI IV C19 D1    Rate verified and armband double check with second RN: yes    Patient education offered and stated understanding. Denies questions at this time.    Chemo consent signed on 12/29/20.  Pt seen in exam by Dr. Eldred Manges, no new questions or concerns at this time.  Pt tolerated irinotecan infusion without incident.  Port flushed with good blood return and connected to 5FU ambulatory pump.  Pt denies need for AVS, left ambulatory at 1524.

## 2021-12-12 NOTE — Patient Instructions
For any additional questions or concerns for Dr Kasi or Erin Carroll's clinic, please send us a mychart message or call Maddie or Ashely at [phone] from 8-4 Monday-Friday. If you need someone after 4 pm or over the weekend please call [phone].

## 2021-12-12 NOTE — Patient Instructions
Call Immediately to report the following:  Unexplained bleeding or bleeding that will not stop  Difficulty swallowing  Shortness of breath, wheezing, or trouble breathing  Rapid, irregular heartbeat; chest pain  Dizziness, lightheadedness  Rash or cut that swells or turns red, feels hot or painful, or begin to ooze  Diarrhea   Uncontrolled nausea or vomiting  Fever of 100.4 F or higher, or chills    Important Phone Numbers:  Cancer Center Main Number (answered 24 hours a day) [phone]  Cancer Center Scheduling (appointments) [phone]  Social Worker [phone]  Nutritionist [phone]

## 2021-12-22 ENCOUNTER — Ambulatory Visit: Admit: 2021-12-22 | Discharge: 2021-12-22 | Payer: MEDICARE

## 2021-12-22 ENCOUNTER — Encounter: Admit: 2021-12-22 | Discharge: 2021-12-22 | Payer: MEDICARE

## 2021-12-22 MED ORDER — ARTIFICIAL TEARS (PF) SINGLE DOSE DROPS GROUP
OPHTHALMIC | 0 refills | Status: DC
Start: 2021-12-22 — End: 2021-12-22

## 2021-12-22 MED ORDER — FENTANYL CITRATE (PF) 50 MCG/ML IJ SOLN
INTRAVENOUS | 0 refills | Status: DC
Start: 2021-12-22 — End: 2021-12-22

## 2021-12-22 MED ORDER — PROPOFOL INJ 10 MG/ML IV VIAL
INTRAVENOUS | 0 refills | Status: DC
Start: 2021-12-22 — End: 2021-12-22

## 2021-12-22 MED ORDER — SUCCINYLCHOLINE CHLORIDE 20 MG/ML IJ SOLN
INTRAVENOUS | 0 refills | Status: DC
Start: 2021-12-22 — End: 2021-12-22

## 2021-12-22 MED ORDER — ONDANSETRON HCL (PF) 4 MG/2 ML IJ SOLN
INTRAVENOUS | 0 refills | Status: DC
Start: 2021-12-22 — End: 2021-12-22

## 2021-12-22 MED ORDER — PHENYLEPHRINE HCL IN 0.9% NACL 1 MG/10 ML (100 MCG/ML) IV SYRG
INTRAVENOUS | 0 refills | Status: DC
Start: 2021-12-22 — End: 2021-12-22

## 2021-12-22 MED ORDER — DEXAMETHASONE SODIUM PHOSPHATE 4 MG/ML IJ SOLN
INTRAVENOUS | 0 refills | Status: DC
Start: 2021-12-22 — End: 2021-12-22

## 2021-12-22 MED ORDER — LACTATED RINGERS IV SOLP
INTRAVENOUS | 0 refills | Status: DC
Start: 2021-12-22 — End: 2021-12-22

## 2021-12-22 MED ORDER — LIDOCAINE (PF) 200 MG/10 ML (2 %) IJ SYRG
INTRAVENOUS | 0 refills | Status: DC
Start: 2021-12-22 — End: 2021-12-22

## 2021-12-22 NOTE — Anesthesia Pre-Procedure Evaluation
Anesthesia Pre-Procedure Evaluation    Name: Vicki Buchanan      MRN: [id number]     DOB: 1956/03/19     Age: 66 y.o.     Sex: female   _________________________________________________________________________     Procedure Info:   Procedure Information     Date/Time: 12/22/21 0835    Procedure: ENDOSCOPIC RETROGRADE CHOLANGIOPANCREATOGRAPHY  plastic stent replacement    Location: ENDO 2 / ENDO/GI    Surgeons: Eliott Nine, MD          Physical Assessment  Vital Signs (last filed in past 24 hours):         Patient History   Allergies   Allergen Reactions   ? Indomethacin SEE COMMENTS     Pain for several days after suppository post ERCP. Tolerated IV but not PR   ? Shrimp SEE COMMENTS     Eye irritation. Namely blood vessel issues in the eyes         Current Medications    Medication Directions   amLODIPine (NORVASC) 10 mg tablet Take one tablet by mouth daily.  Patient taking differently: Take one tablet by mouth every morning.   calcium carbonate (TUMS) 500 mg (200 mg elemental calcium) chewable tablet Chew one tablet by mouth every 6 hours as needed.   carvediloL (COREG) 6.25 mg tablet Take one tablet by mouth twice daily. Take with food.  Hold if systolic blood pressure < 110   Cetirizine (ZYRTEC) 10 mg cap Take one capsule by mouth as Needed.   ciprofloxacin (CIPRO) 250 mg tablet Take one tablet by mouth every 48 hours.   cyclobenzaprine (FLEXERIL) 5 mg tablet Take one tablet by mouth three times daily as needed.   dexAMETHasone (DECADRON) 4 mg tablet 1 tab by mouth with breakfast and afternoon snack on days 2 & 3 following each chemotherapy cycle. Do not take after 6 pm to avoid insomnia.   diphenoxylate-atropine (LOMOTIL) 2.5-0.025 mg tablet Take one tablet by mouth four times daily as needed for Diarrhea.   famotidine (PEPCID AC) 10 mg tablet Take one tablet by mouth twice daily.   LIDO/ANTACID/APAP 1:3:1 SUSP (MIXTURE COMPOUND) Take 30 mL (1 cup) by mouth every 4 hours as needed.   loperamide (IMODIUM A-D) 2 mg capsule Take one pill by mouth four times daily as needed   LORazepam (ATIVAN) 0.5 mg tablet Take 1-2 tabs by mouth every 6 hrs as needed N/V not controlled by Zofran or Compazine. May also use every 6 hrs as needed anxiety or at bedtime insomnia.   magnesium oxide 400 mg magnesium tablet Take one tablet by mouth twice daily.  Patient taking differently: Take one tablet by mouth twice daily as needed.   ondansetron (ZOFRAN ODT) 8 mg rapid dissolve tablet Dissolve one tablet by mouth every 8 hours as needed for Nausea or Vomiting. Place on tongue to disolve.   potassium chloride (KLOR-CON SPRINKLE) 10 mEq capsule Take one capsule by mouth twice daily. If potassium < 3.5. Take with a meal and a full glass of water.   psyllium husk (METAMUCIL) 0.4 gram cap Take one capsule by mouth daily as needed.   sod chlor,sod bicarb/neti pot (SINUS WASH NETI POT SINI) Use as directed   sodium chloride 0.9% (NS) 0.9 % 1,000 mL with potassium chloride 2 mEq/mL 20 mEq, magnesium sulfate 4 mEq/mL (50 %) 2 g IV infusion Administer 1L NS with K and 2g Mg once weekly via outpatient infusion.   traMADoL (ULTRAM) 50 mg  tablet Take one tablet to two tablets by mouth daily as needed for Pain.       Review of Systems/Medical History      Patient summary reviewed  Nursing notes reviewed  Pertinent labs reviewed    PONV Screening: Non-smoker and Female sex  No history of anesthetic complications  No family history of anesthetic complications      Airway - negative        Previous grade 1 view with MAC 3      Pulmonary - negative         Lung metastases s/p radiation      Cardiovascular         Exercise tolerance: >4 METS      Beta Blocker therapy: No      Beta blockers within 24 hours: n/a      Hypertension, well controlled                  GI/Hepatic/Renal             GERD, poorly controlled      Liver disease (biliary obstruction):         Renal disease: CKD Stage 3 (eGFR 30-59)           S/p HIPEC, 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        Neuro/Psych - negative        Musculoskeletal - negative        No back pain        Endocrine/Other           No anemia        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    Abdominal Findings:       Not obese    Neurological Findings:       Alert and oriented x 3    Constitutional findings:       No acute distress       Previous Airway Procedure Notes Displaying the 3 most recent records   No records found.         Patient Lines/Drains/Airways Status     Active Lines:     Name Placement date Placement time Site Days    Implantable Port-a-cath Single Lumen 04/23/17 0845 04/23/17  0845  Subclavian, Left  1704    Ileostomy 04/03/17 0951 Lower Right Quadrant 04/03/17  0951  Lower Right Quadrant  1724              Diagnostic Tests  Hematology:   Lab Results   Component Value Date    HGB 9.3 12/12/2021    HCT 28.1 12/12/2021    PLTCT 367 12/12/2021    WBC 5.9 12/12/2021  NEUT 34 12/12/2021    ANC 2.00 12/12/2021    LYMPH 50 08/31/2021    ALC 2.70 12/12/2021    MONA 18 12/12/2021    AMC 1.00 12/12/2021    EOSA 3 12/12/2021    ABC 0.00 12/12/2021    MCV 101.2 12/12/2021    MCH 33.4 12/12/2021    MCHC 33.0 12/12/2021    MPV 7.3 12/12/2021    RDW 15.4 12/12/2021         General Chemistry:   Lab Results   Component Value Date    NA 142 12/12/2021    K 3.7 12/12/2021    CL 96 12/12/2021    CO2 37 12/12/2021    GAP 9 12/12/2021    BUN 23 12/12/2021    CR 2.80 12/12/2021    GLU 136 12/12/2021    CA 9.1 12/12/2021    ALBUMIN 3.6 12/12/2021    MG 1.2 12/12/2021    TOTBILI 0.7 12/12/2021    PO4 3.3 11/26/2018      Coagulation:   Lab Results   Component Value Date    PTT 28.7 07/10/2018    INR 1.3 07/10/2018       PAC Plan    Anesthesia Plan    ASA score: 3   Plan: general  Induction method: intravenous  NPO status: acceptable      Informed Consent  Anesthetic plan and risks discussed with patient.  Use of blood products discussed with patient  Blood Consent: consented      Plan discussed with: anesthesiologist and CRNA.      Alerts

## 2021-12-22 NOTE — Anesthesia Post-Procedure Evaluation
Post-Anesthesia Evaluation    Name: Vicki Buchanan      MRN: [id number]     DOB: 07/19/1955     Age: 66 y.o.     Sex: female   __________________________________________________________________________     Procedure Information     Anesthesia Start Date/Time: 12/22/21 0853    Procedure: ENDOSCOPIC RETROGRADE CHOLANGIOPANCREATOGRAPHY  plastic stent replacement    Location: ENDO 1 / ENDO/GI    Surgeons: Eligah East, MD          Post-Anesthesia Vitals  BP: 119/74 (10/05 0945)  Temp: 35.9 C (96.7 F) (10/05 0941)  Pulse: 73 (10/05 0945)  Respirations: 13 PER MINUTE (10/05 0945)  SpO2: 99 % (10/05 0945)  SpO2 Pulse: 73 (10/05 0945)   Vitals Value Taken Time   BP 119/74 12/22/21 0945   Temp 35.9 C (96.7 F) 12/22/21 0941   Pulse 73 12/22/21 0945   Respirations 13 PER MINUTE 12/22/21 0945   SpO2 99 % 12/22/21 0945   O2 Device     ABP     ART BP           Post Anesthesia Evaluation Note    Evaluation location: Pre/Post  Patient participation: recovered; patient participated in evaluation  Level of consciousness: alert    Pain score: 0  Pain management: adequate    Hydration: normovolemia  Temperature: 36.0C - 38.4C  Airway patency: adequate    Perioperative Events       Post-op nausea and vomiting: no PONV    Postoperative Status  Cardiovascular status: hemodynamically stable  Respiratory status: spontaneous ventilation  Follow-up needed: none        Perioperative Events  There were no known notable events for this encounter.

## 2021-12-22 NOTE — Anesthesia Procedure Notes
Procedure: Airway Placement    AIRWAY INSERTION    Date/Time: 12/22/2021 8:55 AM    Patient location: OR  Urgency: elective  Difficult Airway: No            Airway Procedure  Indication(s) for airway management: surgery        Rapid Sequence Induction (RSI) used: yes    Preoxygenated: yes  Patient position: sniffing  Neck stabilization: no in-line stabilization    Mask difficulty assessment: 0 - not attempted      Procedure Outcome  Final airway type: endotracheal airway  Endotracheal airway: ETT          ETT size (mm): 7.0  Technique used for successful ETT placement: direct laryngoscopy  Devices/methods used in placement: intubating stylet  Insertion site: oral  Blade type: Macintosh   Laryngoscope/Videolaryngoscope blade size: 3  Cormack-Lehane classification: grade I - full view of glottis        Number of attempts at approach: 1  Placement verified by auscultation            Complications  Cardiovascular:   Pulmonary:   Procedure: airway not difficult  Medication:         Performed by: Kathryne Gin, CRNA  Authorized by: Irena Cords, MD

## 2021-12-23 ENCOUNTER — Encounter: Admit: 2021-12-23 | Discharge: 2021-12-23 | Payer: MEDICARE

## 2021-12-23 DIAGNOSIS — C189 Malignant neoplasm of colon, unspecified: Secondary | ICD-10-CM

## 2021-12-23 DIAGNOSIS — I454 Nonspecific intraventricular block: Secondary | ICD-10-CM

## 2021-12-23 DIAGNOSIS — C181 Malignant neoplasm of appendix: Secondary | ICD-10-CM

## 2021-12-23 DIAGNOSIS — C801 Malignant (primary) neoplasm, unspecified: Secondary | ICD-10-CM

## 2022-01-02 ENCOUNTER — Encounter: Admit: 2022-01-02 | Discharge: 2022-01-02 | Payer: MEDICARE

## 2022-01-02 MED ORDER — ATROPINE 0.4 MG/ML IJ SOLN
0.4 mg | INTRAVENOUS | 0 refills | PRN
Start: 2022-01-02 — End: ?

## 2022-01-02 MED ORDER — PALONOSETRON 0.25 MG/5 ML IV SOLN
.25 mg | Freq: Once | INTRAVENOUS | 0 refills
Start: 2022-01-02 — End: ?

## 2022-01-02 MED ORDER — FLUOROURACIL IV AMB PUMP
2400 mg/m2 | Freq: Once | INTRAVENOUS | 0 refills
Start: 2022-01-02 — End: ?

## 2022-01-02 MED ORDER — DEXAMETHASONE 6 MG PO TAB
12 mg | Freq: Once | ORAL | 0 refills
Start: 2022-01-02 — End: ?

## 2022-01-08 NOTE — Progress Notes
Date of Service: 01/09/2022    SUBJECTIVE:             Reason for Visit:  Follow Up    Vicki Buchanan is a 66 y.o. 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  02/19/17 diagnostic laparoscopy with biopsies of the right diaphragm, right liver, right fallopian tube and left pelvis. 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  Planned for exploratory laparotomy with CRS/HIPEC in combination with a TAH/BSO in 03/2017.  Developed intestinal obstruction and taken to OR for CRS/HIPEC surgery; however multiple adhesions, omental cracking. CRS/HIPEC was aborted.  Immunotherapy on epacadostat?+ sirolimus?trial 12/24/17-05/21/18.  She stopped study drug on 05/21/18 for mitomycin HIPEC trial at [facility] [place].  On 05/28/18 diagnostic laparoscopy, evacuation of mucinous ascites 1.5L, peritoneal biopsy, laparoscopic HIPEC with mitomycin-C in [place] by Dr. Daryl Eastern.  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  Completed second laparoscopic HIPEC on 06/25/18.  Again 1.5L removed.  09/12/18 underwent debulking at [facility] in [place], [place].  Removed 98% by Dr. [name].  Discharged on 09/30/18.  She had L pleural effusion requiring chest tube drainage; intra-abd abscess 10/2018; discharged on 11/04/18 with drain and IV cefepime and Diflucan. Her ureteral stents were removed and ostomy relocated in 10/2018.  Hospitalized in 01/2019 for AKI 2/2 dehydration and likely PE (V/Q scan and trop elevation). CEA increasing again. Repeat CT CAP 03/20/19 with stable to slight increase in cancer and RUL nodule.  Discussed observation vs treatment. She chose observation and is considering repeat CRS + HIPEC via Dr. [name] in [place].  CT scans from 09/24/19 of the abdomen, without contrast, appear stable.  The peritoneal thickening, carcinomatosis, retroperitoneal lymph nodes, a small amount of fluid collection or trace ascites, appear to be stable compared to 3 months ago. 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  CT scan on 08/17/20 showed a right lung mass that is slightly increased from 11 mm to 14 mm. There are some new lung nodules that have developed, a small one in the left lung, a new nodule in the right lung. Inside the abdomen we are seeing continued increase in size of the peritoneal nodules. The largest measuring 14 mm compared to 12 mm.  The nodule lower in the pelvis increased from 16 mm to 21 mm.  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  TMB = 1.3.  S/p CRS w/ ostomy revision and tumor removal around stoma on 10/14/20.  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 [name] PA from [facility] in [place] called to relay their TB recommendations for patient.  Recommendations to proceed with FOLFIRI/Avastin with holding Avastin 6-8 weeks post-op. They also request if our office can repeat genomic testing.  Repeat TEMPUS from 10/14/20.  Pre-screen for KRAS G12D inhibitor trial, if not eligible will plan for FOLFIRI +/- bevacizumab.          Interval Hx 01/09/22:    Pt had stent replacement on 12/22/2021 with Dr. Wynonia Lawman. This went well with no fevers or chills. She had some dysuria today but has not had any in a while. She is now giving herself IVF at home 2 times a week and getting then once a week in [place] with magnesium replacement. She went out of state to see her children and grandchildren and came back with  a cold. She denies fevers but has a loose cough. She also has a lot of allergies. She has not taken anything for this but Zyrtec daily.        Review of Systems   Constitutional: Positive for fatigue. Negative for activity change, appetite change, chills, diaphoresis, fever and unexpected weight change.   HENT: Negative for facial swelling, mouth sores, sore throat, trouble swallowing and voice change.    Eyes: Negative.  Negative for visual disturbance.   Respiratory: Positive for cough. Negative for chest tightness, shortness of breath and wheezing.    Cardiovascular: Negative.  Negative for chest pain, palpitations and leg swelling.   Gastrointestinal: Positive for diarrhea. Negative for abdominal distention, abdominal pain, anal bleeding, blood in stool, constipation, nausea, rectal pain and vomiting.   Genitourinary: Positive for dysuria. Negative for difficulty urinating and hematuria.   Musculoskeletal: Negative.  Negative for arthralgias, back pain, neck pain and neck stiffness.   Skin: Negative for color change, pallor, rash and wound.   Neurological: Positive for weakness. Negative for dizziness, light-headedness, numbness and headaches.   Hematological: Does not bruise/bleed easily.   Psychiatric/Behavioral: Negative for agitation. The patient is not nervous/anxious.        Medical History:   Diagnosis Date   ? Bundle branch block    ? Cancer [facility])    ? Cancer of appendix (HCC)    ? Cancer of colon (HCC) 11/19     Surgical History:   Procedure Laterality Date   ? CESAREAN SECTION  1981   ? HX CHOLECYSTECTOMY  1995   ? LAPAROSCOPY  02/2017    biopsy peritoneal   ? COLONOSCOPY DIAGNOSTIC WITH SPECIMEN COLLECTION BY BRUSHING/ WASHING - FLEXIBLE N/A 03/19/2017    Performed by Everardo All, MD at [facility] ENDO   ? COLONOSCOPY WITH BIOPSY - FLEXIBLE  03/19/2017    Performed by Everardo All, MD at [facility] ENDO   ? EXPLORATORY LAPAROTOMY, DIVERTING LOOP ILEOSTOMY N/A 04/03/2017    Performed by Loralie Champagne, MD at [facility] OR   ? INSERTION TUNNELED CENTRAL VENOUS CATHETER - AGE 5 YEARS AND OVER Left 04/23/2017    Performed by Loralie Champagne, MD at [facility] OR   ? CYSTOURETHROSCOPY WITH INDWELLING URETERAL STENT INSERTION Bilateral 04/09/2018    Performed by Earl Many, MD at [facility] OR   ? CYSTOURETHROSCOPY WITH URETERAL CATHETERIZATION WITH/ WITHOUT IRRIGATION/ INSTILLATION/ URETEROPYELOGRAPHY Bilateral 04/09/2018    Performed by Earl Many, MD at [facility] OR   ? CYSTOURETHROSCOPY WITH INDWELLING URETERAL STENT EXCHANGE (RIGHT 6 x 26cm / LEFT 6 x 28cm) Bilateral 08/08/2018    Performed by Earl Many, MD at [facility] OR   ? RETROGRADE UROGRAPHY WITH/ WITHOUT KUB Bilateral 08/08/2018    Performed by Earl Many, MD at [facility] OR   ? ENDOSCOPIC RETROGRADE CHOLANGIOPANCREATOGRAPHY WITH SPHINCTEROTOMY/ PAPILLOTOMY N/A 09/16/2021    Performed by Vertell Novak, MD at [facility] ENDO   ? ESOPHAGOGASTRODUODENOSCOPY WITH ENDOSCOPIC ULTRASOUND EXAMINATION - FLEXIBLE  09/16/2021    Performed by Vertell Novak, MD at [facility] ENDO   ? ENDOSCOPIC RETROGRADE CHOLANGIOPANCREATOGRAPHY WITH PLACEMENT ENDOSCOPIC STENT INTO BILIARY/ PANCREATIC DUCT - EACH STENT  09/16/2021    Performed by Vertell Novak, MD at [facility] ENDO   ? COLONOSCOPY DIAGNOSTIC WITH SPECIMEN COLLECTION BY BRUSHING/ WASHING - FLEXIBLE N/A 09/26/2021    Performed by Vertell Novak, MD at [facility] OR   ? ENDOSCOPIC RETROGRADE CHOLANGIOPANCREATOGRAPHY WITH PLACEMENT ENDOSCOPIC STENT INTO BILIARY/ PANCREATIC DUCT - EACH STENT  09/29/2021  Performed by Eliott Nine, MD at [facility] ENDO   ? ENDOSCOPIC RETROGRADE CHOLANGIOPANCREATOGRAPHY with plastic stent exchange N/A 10/27/2021    Performed by Eliott Nine, MD at [facility] ENDO   ? ENDOSCOPIC RETROGRADE CHOLANGIOPANCREATOGRAPHY WITH ABLATION TUMOR/ POLYP/ OTHER LESION  10/27/2021    Performed by Eliott Nine, MD at [facility] ENDO   ? ENDOSCOPIC RETROGRADE CHOLANGIOPANCREATOGRAPHY WITH REMOVAL CALCULI/ DEBRIS FROM BILIARY/ PANCREATIC DUCT  10/27/2021    Performed by Eliott Nine, MD at [facility] ENDO   ? ENDOSCOPIC RETROGRADE CHOLANGIOPANCREATOGRAPHY  plastic stent replacement N/A 12/22/2021    Performed by Eliott Nine, MD at [facility] ENDO   ? COLON SURGERY  7/20   ? COLON SURGERY  7/20   ? HX APPENDECTOMY  1/10    Guess on date   ? HX HYSTERECTOMY  7/20   ? HX LOWER ANTERIOR RESECTION OF COLON      7/20   ? ILEOSTOMY OR JEJUNOSTOMY Right    ? PORTACATH PLACEMENT  1/20 ? PR PNCRTECT PROX STOT W/PANCREATOJEJUNOSTOMY  7/20   ? TUNNELED VENOUS PORT PLACEMENT Left    ? URETER STENT PLACEMENT Bilateral      Family History   Problem Relation Age of Onset   ? Diabetes Mother    ? Cancer Father    ? Diabetes Brother    ? Cancer-Lung Paternal Aunt    ? Cancer-Breast Maternal Grandmother    ? Diabetes Paternal Grandmother      Social History     Socioeconomic History   ? Marital status: Married   Tobacco Use   ? Smoking status: Never   ? Smokeless tobacco: Never   Vaping Use   ? Vaping Use: Never used   Substance and Sexual Activity   ? Alcohol use: No     Comment: rarely   ? Drug use: No         OBJECTIVE:         ? amLODIPine (NORVASC) 10 mg tablet Take one tablet by mouth daily. (Patient taking differently: Take one tablet by mouth every morning.)   ? amoxicillin-potassium clavulanate (AUGMENTIN) 875/125 mg tablet Take one tablet by mouth twice daily with meals.   ? calcium carbonate (TUMS) 500 mg (200 mg elemental calcium) chewable tablet Chew one tablet by mouth every 6 hours as needed.   ? carvediloL (COREG) 6.25 mg tablet Take one tablet by mouth twice daily. Take with food.  Hold if systolic blood pressure < 110   ? Cetirizine (ZYRTEC) 10 mg cap Take one capsule by mouth as Needed.   ? ciprofloxacin (CIPRO) 250 mg tablet Take one tablet by mouth every 48 hours.   ? cyclobenzaprine (FLEXERIL) 5 mg tablet Take one tablet by mouth three times daily as needed.   ? dexAMETHasone (DECADRON) 4 mg tablet 1 tab by mouth with breakfast and afternoon snack on days 2 & 3 following each chemotherapy cycle. Do not take after 6 pm to avoid insomnia.   ? diphenoxylate-atropine (LOMOTIL) 2.5-0.025 mg tablet Take one tablet by mouth four times daily as needed for Diarrhea.   ? famotidine (PEPCID AC) 10 mg tablet Take one tablet by mouth twice daily.   ? LIDO/ANTACID/APAP 1:3:1 SUSP (MIXTURE COMPOUND) Take 30 mL (1 cup) by mouth every 4 hours as needed.   ? loperamide (IMODIUM A-D) 2 mg capsule Take one pill by mouth four times daily as needed   ? LORazepam (ATIVAN) 0.5 mg tablet Take 1-2 tabs by mouth every 6 hrs as needed  N/V not controlled by Zofran or Compazine. May also use every 6 hrs as needed anxiety or at bedtime insomnia.   ? magnesium oxide 400 mg magnesium tablet Take one tablet by mouth twice daily. (Patient taking differently: Take one tablet by mouth twice daily as needed.)   ? ondansetron (ZOFRAN ODT) 8 mg rapid dissolve tablet Dissolve one tablet by mouth every 8 hours as needed for Nausea or Vomiting. Place on tongue to disolve.   ? potassium chloride (KLOR-CON SPRINKLE) 10 mEq capsule Take one capsule by mouth twice daily. If potassium < 3.5. Take with a meal and a full glass of water.   ? psyllium husk (METAMUCIL) 0.4 gram cap Take one capsule by mouth daily as needed.   ? sod chlor,sod bicarb/neti pot (SINUS WASH NETI POT SINI) Use as directed   ? sodium chloride 0.9% (NS) 0.9 % 1,000 mL with potassium chloride 2 mEq/mL 20 mEq, magnesium sulfate 4 mEq/mL (50 %) 2 g IV infusion Administer 1L NS with K and 2g Mg once weekly via outpatient infusion.   ? traMADoL (ULTRAM) 50 mg tablet Take one tablet to two tablets by mouth daily as needed for Pain.     Vitals:    01/09/22 1231   BP: 109/71   BP Source: Arm, Left Upper   Pulse: 90   Temp: 36.6 ?C (97.8 ?F)   Resp: 16   SpO2: 100%   TempSrc: Temporal   PainSc: Zero   Weight: 49.4 kg (108 lb 12.8 oz)     Body mass index is 19.9 kg/m?Marland Kitchen     Pain Score: Zero       Pain Addressed:  N/A    Patient Evaluated for a Clinical Trial: Patient not eligible for a treatment trial (including not needing treatment, needs palliative care, in remission).     [place] Cooperative Oncology Group performance status is 1, Restricted in physically strenuous activity but ambulatory and able to carry out work of a light or sedentary nature, e.g., light house work, office work.       Physical Exam  Vitals reviewed.   Constitutional:       General: She is not in acute distress.     Appearance: Normal appearance. She is well-developed. She is not ill-appearing, toxic-appearing or diaphoretic.   HENT:      Head: Normocephalic and atraumatic.      Nose: Nose normal. No rhinorrhea.      Mouth/Throat:      Mouth: Mucous membranes are moist. Mucous membranes are not pale. No oral lesions.      Pharynx: Oropharynx is clear. No oropharyngeal exudate or posterior oropharyngeal erythema.      Tonsils: No tonsillar abscesses.   Eyes:      General: No scleral icterus.        Right eye: No discharge.         Left eye: No discharge.      Extraocular Movements: Extraocular movements intact.      Conjunctiva/sclera: Conjunctivae normal.      Pupils: Pupils are equal, round, and reactive to light.   Cardiovascular:      Rate and Rhythm: Normal rate and regular rhythm.      Pulses: Normal pulses.      Heart sounds: Normal heart sounds. No murmur heard.     No gallop.   Pulmonary:      Effort: Pulmonary effort is normal. No respiratory distress.      Breath sounds: No stridor.  Wheezing present. No rhonchi or rales.   Chest:      Chest wall: No tenderness.      Comments: Port site WNL  Abdominal:      General: A surgical scar is present. Bowel sounds are normal. There is no distension.      Palpations: There is no mass.      Tenderness: There is no abdominal tenderness. There is no guarding or rebound.      Hernia: No hernia is present.          Comments: Firmness in epigastric area.    Musculoskeletal:         General: No swelling, tenderness, deformity or signs of injury. Normal range of motion.      Cervical back: Normal range of motion and neck supple. No rigidity. No muscular tenderness.      Right lower leg: No edema.      Left lower leg: No edema.   Lymphadenopathy:      Cervical: No cervical adenopathy.   Skin:     General: Skin is warm and dry.      Coloration: Skin is not jaundiced or pale.      Findings: No bruising, erythema, lesion or rash.   Neurological:      General: No focal deficit present.      Mental Status: She is alert and oriented to person, place, and time. Mental status is at baseline.      Cranial Nerves: No cranial nerve deficit.      Motor: No weakness.      Coordination: Coordination normal.      Gait: Gait normal.   Psychiatric:         Mood and Affect: Mood normal.         Behavior: Behavior normal.         Thought Content: Thought content normal.         Judgment: Judgment normal.            CBC w/DIFF      Latest Ref Rng & Units 01/09/2022    10:47 AM 12/12/2021    10:41 AM 11/28/2021     9:05 AM 11/10/2021     8:52 AM 10/12/2021     8:09 AM   CBC with Diff   White Blood Cells 4.5 - 11.0 K/UL 15.5  5.9  7.2  12.7  11.8    RBC 4.0 - 5.0 M/UL 2.69  2.78  2.85  3.08  3.00    Hemoglobin 12.0 - 15.0 GM/DL 8.9  9.3  9.7  45.4  09.8    Hematocrit 36 - 45 % 27.2  28.1  28.8  31.2  30.9    MCV 80 - 100 FL 101.2  101.2  101.0  101.4  103.1    MCH 26 - 34 PG 33.2  33.4  33.9  33.5  33.5    MCHC 32.0 - 36.0 G/DL 11.9  14.7  82.9  56.2  32.5    RDW 11 - 15 % 16.4  15.4  14.8  15.0  14.1    Platelet Count 150 - 400 K/UL 452  367  402  508  410    MPV 7 - 11 FL 7.6  7.3  7.8  8.0  7.8    Neutrophils 41 - 77 % 68  34  32  52  56    Absolute Neutrophil Count 1.8 - 7.0 K/UL 10.30  2.00  2.30  6.70  6.70    Lymphocytes 24 - 44 % 19  45  41  32  21    Absolute Lymph Count 1.0 - 4.8 K/UL 3.00  2.70  3.00  4.10  2.40    Monocytes 4 - 12 % 12  18  22  14  15     Absolute Monocyte Count 0 - 0.80 K/UL 1.90  1.00  1.60  1.80  1.70    Eosinophils 0 - 5 % 1  3  4  1  7     Absolute Eosinophil Count 0 - 0.45 K/UL 0.20  0.20  0.30  0.10  0.80    Basophils 0 - 2 % 0  0  1  1  1     Absolute Basophil Count 0 - 0.20 K/UL 0.10  0.00  0.00  0.10  0.10      Comprehensive Metabolic Profile      Latest Ref Rng & Units 01/09/2022    10:47 AM 12/12/2021    10:41 AM 11/28/2021     9:05 AM 11/10/2021     8:52 AM 10/12/2021     8:09 AM   CMP   Sodium 137 - 147 MMOL/L 136  142  138  139  134    Potassium 3.5 - 5.1 MMOL/L 3.5  3.7 3.9  3.4  4.5    Chloride 98 - 110 MMOL/L 97  96  101  97  106    CO2 21 - 30 MMOL/L 30  37  32  33  20    Anion Gap 3 - 12 9  9  5  9  8     Blood Urea Nitrogen 7 - 25 MG/DL 25  23  20   34  18    Creatinine 0.4 - 1.00 MG/DL 1.61  0.96  0.45  4.09  1.45    Glucose 70 - 100 MG/DL 811  914  88  782  956    Calcium 8.5 - 10.6 MG/DL 8.5  9.1  9.0  9.3  8.8    Total Protein 6.0 - 8.0 G/DL 6.8  7.0  7.1  7.5  7.4    Albumin 3.5 - 5.0 G/DL 3.3  3.6  3.5  3.6  3.1    Alk Phosphatase 25 - 110 U/L 188  177  228  245  544    ALT (SGPT) 7 - 56 U/L 16  13  13  22   55    Total Bilirubin 0.3 - 1.2 MG/DL 0.7  0.7  0.7  0.9  1.4      Tumor Markers  Lab Results   Component Value Date    CEA 838.2 (H) 01/09/2022    CEA 511.8 (H) 12/12/2021    CEA 761.7 (H) 11/28/2021    CEA 681.0 (H) 11/10/2021    CEA 482.6 (H) 10/12/2021    CEA 451.0 (H) 09/28/2021    CEA 470.1 (H) 08/31/2021    CEA 456.4 (H) 08/17/2021    CEA 436.3 (H) 08/03/2021    CEA 486.4 (H) 07/20/2021    CA199 <1 01/09/2022    CA199 <1 12/12/2021    CA199 <1 11/28/2021    CA199 <1 11/10/2021    CA199 <1 09/28/2021    CA199 <1 08/31/2021    CA199 <1 08/17/2021    CA199 <1 08/03/2021    CA199 <1 07/20/2021    CA199 <1 07/06/2021    CA125 41 (H) 01/21/2018    CA125 42 (H) 11/21/2017  CA125 46 (H) 11/05/2017    CA125 41 (H) 10/22/2017    CA125 34 10/08/2017    CA125 34 09/24/2017    CA125 31 09/10/2017    CA125 29 08/27/2017    CA125 34 08/15/2017    CA125 39 (H) 07/30/2017           Imaging:  - 04/27/21 CT CAP without contrast  CHEST:   1. ?No significant change in small indeterminate anterior epiphrenic lymph nodes. No new thoracic lymphadenopathy.   2. No significant change to further mild increase in size of pulmonary and pleural metastases.   3. Increasing nodular and tree-in-bud opacities in the lingula and development of mild focal consolidation in the left lower lobe, indeterminate and may be infectious/inflammatory nodules or potentially pulmonary metastases. ?     ABDOMEN AND PELVIS:   1. ?Grossly unchanged extensive peritoneal carcinomatosis.   2. No significant change in small residual indeterminate retroperitoneal lymph nodes. No discrete new abdominopelvic lymphadenopathy.   3. Grossly unchanged right anterior abdominal wall metastases.     GI ENDO CATH BIL & PANC DUCT  This order has been auto finalized and does not contain a result.        ASSESSMENT AND PLAN:    1. Metastatic appendiceal carcinoma. Signet ring adenocarcinoma, MSS, with peritoneal carcinomatosis with  pseudomyxoma peritonei.  02/19/17 diagnostic laparoscopy with biopsies of the right diaphragm, right liver, right fallopian tube and left pelvis. 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  Planned for exploratory laparotomy with CRS/HIPEC in combination with a TAH/BSO in 03/2017.  Developed intestinal obstruction and taken to OR for CRS/HIPEC surgery; however multiple adhesions, omental cracking. CRS/HIPEC was aborted.  Immunotherapy on epacadostat?+ sirolimus?trial 12/24/17-05/21/18.  She stopped study drug on 05/21/18 for mitomycin HIPEC trial at [facility] [place].  On 05/28/18 diagnostic laparoscopy, evacuation of mucinous ascites 1.5L, peritoneal biopsy, laparoscopic HIPEC with mitomycin-C in [place] by Dr. Daryl Eastern.  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  Completed second laparoscopic HIPEC on 06/25/18.  Again 1.5L removed.  09/12/18 underwent debulking at [facility] in [place], [place].  Removed 98% by Dr. [name].  Discharged on 09/30/18.  She had L pleural effusion requiring chest tube drainage; intra-abd abscess 10/2018; discharged on 11/04/18 with drain and IV cefepime and Diflucan. Her ureteral stents were removed and ostomy relocated in 10/2018.  Hospitalized in 01/2019 for AKI 2/2 dehydration and likely PE (V/Q scan and trop elevation). CEA increasing again. Repeat CT CAP 03/20/19 with stable to slight increase in cancer and RUL nodule.  Discussed observation vs treatment. She chose observation and is considering repeat CRS + HIPEC via Dr. [name] in [place].  CT scans from 09/24/19 of the abdomen, without contrast, appear stable.  The peritoneal thickening, carcinomatosis, retroperitoneal lymph nodes, a small amount of fluid collection or trace ascites, appear to be stable compared to 3 months ago. 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  CT scan on 08/17/20 showed a right lung mass that is slightly increased from 11 mm to 14 mm. There are some new lung nodules that have developed, a small one in the left lung, a new nodule in the right lung. Inside the abdomen we are seeing continued increase in size of the peritoneal nodules. The largest measuring 14 mm compared to 12 mm.  The nodule lower in the pelvis increased from 16 mm to 21 mm.  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  TMB =  1.3.  S/p CRS w/ ostomy revision and tumor removal around stoma on 10/14/20.  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 [name] PA from [facility] in [place] called to relay their TB recommendations for patient.  Recommendations to proceed with FOLFIRI/Avastin with holding Avastin 6-8 weeks post-op. They also request if our office can repeat genomic testing.  Repeat TEMPUS from 10/14/20.  Pre-screen for KRAS G12D inhibitor trial, if not eligible will plan for FOLFIRI +/- bevacizumab. Tolerated C1 FOLFIRI but more diarrhea and N/V after C2. Decreased irinotecan to 130 mg/m2 due to diarrhea. S/p C4, with bevacizumab added on. CT scan 02/16/21 abdominal wall spots are under control and stable. There is a slight increase in size of the lung nodules. The radiologist is measuring 10 mm to 8 mm in 11/2020. The other right lung nodule is more or less the same at 15 mm. CT scans with stable disease, mild increase in size of pulmonary metastases (specifically the right upper lobe nodule 2.2cm, previously 1.5cm). There is also some increasing nodular and tree-in-bud opacities in the lingula and development of mild focal consolidation in the left lower lobe, indeterminate but may be related to recent COVID infection vs pulmonary metastases. Given overall stability and down-trending CEA, we will plan to continue with the current treatment regimen for now. Dr. Dorita Fray with Radiation Oncology reviewed imaging (already received 50Gy to right lung 3/1-05/25/20) and will continue to monitor. CT scan on 08/03/2021 shows the cancer inside the abdomen and the abdominal wall metastasis is remaining the same compared to the scans done in 04/2021. The lung metastasis appears slightly larger by about 2 mm. Abnormal labs, even on repeat. Chemo held. Ultrasound reveals moderate ductal dilatation. Refer to GI for ERCP. Continued elevation of liver enzymes and bilirubin. MRI MRCP performed on 09/07/2021 shows extrinsic compression of the common bile duct causing biliary dilatation, as well as both intrahepatic ducts on the right and left side. CT scan 09/28/2021 shows the cancer was overall stable. However, the fact that it is causing biliary obstruction, ureter obstruction, and hydronephrosis tells [place] that clinically it is growing. There was mild hydronephrosis on both kidneys. cycle 18 of FOLFIRI,  stent replacement in 3 weeks, will hold bevacizumab. In October, re-discuss decreasing her Irinotecan to every 4 weeks if no worsening of labs and/or symptoms. CT scan on 12/12/2021 shows the peritoneal mass is stable. The dilation of the bile ducts has improved. The nodules in the lung are stable. There are new cluster of nodules which appear like inflammation.     Plan 01/09/22:  Some URI symptoms with increased WBC but feeling good and ready for chemo, last one 9/25  Will replace magnesium and IVF, start antibiotics and proceed with cycle 20 today with the 5-FU pump and irinotecan, resume bevacizumab post procedure 3 weeks ago  Continue IVF at home as before.   RTC in 2 weeks with labs and visit for C21  03/2022, when she is due for her next set of treatments, we can get updated CT scans of her chest, abdomen, and pelvis.     2. Elevated creatinine, s/p ureteral stent. Her ureteral stents were removed 10/2018. Encouraged hydration. S/p IVF 1L NS and 1g Mg weekly x 4 weeks. Referred to Nephrology as Cr is improving with IVF. She is following with Dr. Marjie Skiff in Nephrology. Cr 2.48 after GI virus. Decreased irinotecan to 130 mg/m2. Continue IV fluids 3 times a  week locally, as we anticipate more diarrhea with irinotecan. Creatinine was greater than 4 with infection. Cr 2.09 today. Has seen urology for chronic infections. Is going on chronic antibiotics to control. Now on 250 cipro every other day. Continue IVF closer to home. Stable at 2.55.    3. Diarrhea. Better with Lomotil daily. Continue IV fluids 3 times a week locally. Increased her Lomotil to 2 tablets 4 times daily. She will continue to take Metamucil to bulk up her stools. She had a day of decreased stool output but was able to flush the Metamucil out with an enema. Bowels are back to baseline now. Continue to take Imodium as needed, okay to increase to 2 tabs at a time as needed. Better with advanced diet. Obstruction with too much fiber. Better with break in chemo. Monitor. IVF as needed throughout week. Improved. Continue Imodium and Lomotil as needed.     4. Nausea. From chemo. Worse after possible GI virus. Tried Compazine. Resolved now that infections have cleared. Chemo as above, continue antiemetics, call if not controlled. Less with last chemo. Better with break in chemo and controlled UTI.     5. Fatigue. Improved now that infections are better. More with on-going chemo. Improved with break. Resolving.      6. Hypomagnesemia. Due to large volume fluid loss. 1.4 today. Replace in treatment.      7. Urinary tract infection. Now managed by urology. On suppressive antibiotics cipro 250 mg every 3 days. Follow up with her urologist. Minimal increase in symptoms. Follow up with urology    8. Elevated LFTs. May be irinotecan, tylenol, cipro. Hold tylenol. Continue antibiotics. Improved after holding tylenol. Now elevated again with bilirubin 2.7, AST 66 and ALT 106. Some improvement after IVF but bilirubin still elevated at 2.3. ultrasound of abdomen to assess for biliary obstruction/dilation. This revealed moderate intrahepatic bile duct dilatation. 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 Refer to GI for possible ERCP. Stent placed. Now bili higher at 4.1 and LFTs higher. White count elevated. Admitted to hospital for repeat ERCP. They were able to extend stent, no PTC. Bilirubin now 1.4. ok to resume treatment. Repeat scope 9/7. Now normal after stent adjustment. Replace recently, next in 6 months.     9. Wheezing. On right side, denies cough or fever. She reports more allergies. grandkids had a cold, with URI symptoms. Antibiotics with elevated white count.       Turner Daniels, APRN-NP        The patient and family were allowed to ask questions and voice concerns; these were addressed to the best of our ability. They expressed understanding of what was explained to them, and they agreed with the present plan. My collaborating physician on this case is Dr. Josiah Lobo.

## 2022-01-09 ENCOUNTER — Encounter: Admit: 2022-01-09 | Discharge: 2022-01-09 | Payer: MEDICARE

## 2022-01-09 DIAGNOSIS — I454 Nonspecific intraventricular block: Secondary | ICD-10-CM

## 2022-01-09 DIAGNOSIS — C189 Malignant neoplasm of colon, unspecified: Secondary | ICD-10-CM

## 2022-01-09 DIAGNOSIS — C801 Malignant (primary) neoplasm, unspecified: Secondary | ICD-10-CM

## 2022-01-09 DIAGNOSIS — C181 Malignant neoplasm of appendix: Secondary | ICD-10-CM

## 2022-01-09 DIAGNOSIS — C786 Secondary malignant neoplasm of retroperitoneum and peritoneum: Secondary | ICD-10-CM

## 2022-01-09 LAB — MAGNESIUM: MAGNESIUM: 1.3 mg/dL — ABNORMAL LOW (ref 1.6–2.6)

## 2022-01-09 LAB — COMPREHENSIVE METABOLIC PANEL
ALBUMIN: 3.3 g/dL — ABNORMAL LOW (ref 3.5–5.0)
ALK PHOSPHATASE: 188 U/L — ABNORMAL HIGH (ref 25–110)
ALT: 16 U/L (ref 7–56)
ANION GAP: 9 K/UL — ABNORMAL HIGH (ref 3–12)
AST: 26 U/L (ref 7–40)
BLD UREA NITROGEN: 25 mg/dL — ABNORMAL HIGH (ref 7–25)
CALCIUM: 8.5 mg/dL (ref 8.5–10.6)
CHLORIDE: 97 MMOL/L — ABNORMAL LOW (ref 98–110)
CO2: 30 MMOL/L — ABNORMAL HIGH (ref 21–30)
CREATININE: 2.5 mg/dL — ABNORMAL HIGH (ref 0.4–1.00)
EGFR: 20 mL/min — ABNORMAL LOW (ref >60)
GLUCOSE,PANEL: 108 mg/dL — ABNORMAL HIGH (ref 70–100)
POTASSIUM: 3.5 MMOL/L — ABNORMAL HIGH (ref 3.5–5.1)
SODIUM: 136 MMOL/L — ABNORMAL LOW (ref 137–147)
TOTAL BILIRUBIN: 0.7 mg/dL — ABNORMAL LOW (ref 0.3–1.2)
TOTAL PROTEIN: 6.8 g/dL (ref 6.0–8.0)

## 2022-01-09 LAB — CBC AND DIFF
ABSOLUTE BASO COUNT: 0.1 K/UL (ref 0–0.20)
HEMOGLOBIN: 8.9 g/dL — ABNORMAL LOW (ref 12.0–15.0)
RBC COUNT: 2.6 M/UL — ABNORMAL LOW (ref 4.0–5.0)
WBC COUNT: 15 K/UL — ABNORMAL HIGH (ref 4.5–11.0)

## 2022-01-09 LAB — CA19.9: CA 19-9: <1 U/mL (ref <35)

## 2022-01-09 LAB — CEA(CARCINOEMBRYONIC AG): CEA: 838 ng/mL — ABNORMAL HIGH (ref <3.0)

## 2022-01-09 MED ORDER — IRINOTECAN IVPB
110 mg/m2 | Freq: Once | INTRAVENOUS | 0 refills | Status: CP
Start: 2022-01-09 — End: ?
  Administered 2022-01-09 (×3): 166.2 mg via INTRAVENOUS

## 2022-01-09 MED ORDER — PALONOSETRON 0.25 MG/5 ML IV SOLN
.25 mg | Freq: Once | INTRAVENOUS | 0 refills | Status: CP
Start: 2022-01-09 — End: ?
  Administered 2022-01-09: 19:00:00 0.25 mg via INTRAVENOUS

## 2022-01-09 MED ORDER — MAGNESIUM SULFATE IN D5W 1 GRAM/100 ML IV PGBK (CC ONLY)
1 g | INTRAVENOUS | 0 refills | Status: CP
Start: 2022-01-09 — End: ?
  Administered 2022-01-09 (×2): 1 g via INTRAVENOUS

## 2022-01-09 MED ORDER — BEVACIZUMAB-AWWB IVPB
5 mg/kg | Freq: Once | INTRAVENOUS | 0 refills | Status: CP
Start: 2022-01-09 — End: ?
  Administered 2022-01-09 (×2): 260.5 mg via INTRAVENOUS

## 2022-01-09 MED ORDER — AMOXICILLIN-POT CLAVULANATE 875-125 MG PO TAB
1 | ORAL_TABLET | Freq: Two times a day (BID) | ORAL | 0 refills | 7.00000 days | Status: AC
Start: 2022-01-09 — End: ?

## 2022-01-09 MED ORDER — DEXAMETHASONE 6 MG PO TAB
12 mg | Freq: Once | ORAL | 0 refills | Status: CP
Start: 2022-01-09 — End: ?
  Administered 2022-01-09: 19:00:00 12 mg via ORAL

## 2022-01-09 MED ORDER — ATROPINE 0.4 MG/ML IJ SOLN
.4 mg | INTRAVENOUS | 0 refills | Status: AC | PRN
Start: 2022-01-09 — End: ?
  Administered 2022-01-09: 20:00:00 0.4 mg via INTRAVENOUS

## 2022-01-09 MED ORDER — FLUOROURACIL IV AMB PUMP
2400 mg/m2 | Freq: Once | INTRAVENOUS | 0 refills | Status: CP
Start: 2022-01-09 — End: ?
  Administered 2022-01-09 (×4): 3624 mg via INTRAVENOUS

## 2022-01-09 MED ORDER — SODIUM CHLORIDE 0.9 % IV SOLP
1000 mL | Freq: Once | INTRAVENOUS | 0 refills | Status: CP
Start: 2022-01-09 — End: ?
  Administered 2022-01-09: 19:00:00 1000 mL via INTRAVENOUS

## 2022-01-09 NOTE — Progress Notes
CHEMO NOTE  Verified chemo consent signed and in chart.    Blood return positive via: Port (Single)    BSA and dose double checked (agree with orders as written) with: yes - see MAR    Labs/applicable tests checked: CBC and Comprehensive Metabolic Panel (CMP)    Chemo regimen: Drug/cycle/day C20 D1    bevacizumab-awwb (MVASI) 260.5 mg in sodium chloride 0.9% (NS) 110.42 mL IVPB     irinotecan (CAMPTOSAR) 166.2 mg in dextrose 5% (D5W) 508.31 mL IVPB     fluorouraciL (ADRUCIL) 3,624 mg in sodium chloride 0.9% (NS) 92 mL IV amb pump    Rate verified and armband double check with second RN: yes    Patient education offered and stated understanding. Denies questions at this time.      Patient arrived to [facility] treatment for MVASI and Folfiri. Pt seen in clinic today by Lanelle Bal, APRN. Pt reports all needs addressed by provider, denies new concerns at this time. VS stable. PAC flushed with positive blood return noted. Parameters met for treatment. Premedications and chemotherapy initiated per treatment plan. Treatment complete at 17:17, with brisk blood return, 20 mL D5W flush pre/post Irinotecan, 20 mL NS flush pre/post Atropine and pre Adrucil CADD pump connected to pt's port. All clamps open, all connections secured, and pump face reads RUN, now infusing home. Pt tolerated without adverse event. Schedule reviewed with patient, uses MyChart. Pt left clinic in stable condition and without further questions or concerns. - Phill Myron, RN, BSN

## 2022-01-09 NOTE — Patient Instructions
For any additional questions or concerns for Dr Kasi or Erin Carroll's clinic, please send us a mychart message or call Maddie or Ashely at [phone] from 8-4 Monday-Friday. If you need someone after 4 pm or over the weekend please call [phone].

## 2022-01-09 NOTE — Patient Instructions
[name]  Chemotherapy Instructions    Vicki Buchanan 01/09/2022    Chemotherapy Drugs:      MVASI and Folfiri    Call Immediately to report the following:  Unexplained bleeding or bleeding that will not stop  Difficulty swallowing  Shortness of breath, wheezing, or trouble breathing  Rapid, irregular heartbeat; chest pain  Dizziness, lightheadedness  Rash or cut that swells or turns red, feels hot or painful, or begin to ooze  Diarrhea   Uncontrolled nausea or vomiting  Fever of 100.4 F or higher, or chills    Important Phone Numbers:  [facility] Number (answered 24 hours a day) [place] (appointments) [phone]  Social Worker [phone]

## 2022-01-19 ENCOUNTER — Encounter: Admit: 2022-01-19 | Discharge: 2022-01-19 | Payer: MEDICARE

## 2022-01-19 ENCOUNTER — Ambulatory Visit: Admit: 2022-01-19 | Discharge: 2022-01-19 | Payer: MEDICARE

## 2022-01-19 DIAGNOSIS — C249 Malignant neoplasm of biliary tract, unspecified: Secondary | ICD-10-CM

## 2022-01-19 DIAGNOSIS — Z4659 Encounter for fitting and adjustment of other gastrointestinal appliance and device: Secondary | ICD-10-CM

## 2022-01-19 DIAGNOSIS — C181 Malignant neoplasm of appendix: Secondary | ICD-10-CM

## 2022-01-24 ENCOUNTER — Encounter: Admit: 2022-01-24 | Discharge: 2022-01-24 | Payer: MEDICARE

## 2022-01-24 DIAGNOSIS — C181 Malignant neoplasm of appendix: Secondary | ICD-10-CM

## 2022-01-24 DIAGNOSIS — C786 Secondary malignant neoplasm of retroperitoneum and peritoneum: Secondary | ICD-10-CM

## 2022-01-24 DIAGNOSIS — C801 Malignant (primary) neoplasm, unspecified: Secondary | ICD-10-CM

## 2022-01-24 DIAGNOSIS — C189 Malignant neoplasm of colon, unspecified: Secondary | ICD-10-CM

## 2022-01-24 DIAGNOSIS — I454 Nonspecific intraventricular block: Secondary | ICD-10-CM

## 2022-01-29 NOTE — Progress Notes
Date of Service: 01/30/2022    SUBJECTIVE:             Reason for Visit:  Follow Up    Vicki Buchanan is a 66 y.o. 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  02/19/17 diagnostic laparoscopy with biopsies of the right diaphragm, right liver, right fallopian tube and left pelvis. 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  Planned for exploratory laparotomy with CRS/HIPEC in combination with a TAH/BSO in 03/2017.  Developed intestinal obstruction and taken to OR for CRS/HIPEC surgery; however multiple adhesions, omental cracking. CRS/HIPEC was aborted.  Immunotherapy on epacadostat?+ sirolimus?trial 12/24/17-05/21/18.  She stopped study drug on 05/21/18 for mitomycin HIPEC trial at [facility] [place].  On 05/28/18 diagnostic laparoscopy, evacuation of mucinous ascites 1.5L, peritoneal biopsy, laparoscopic HIPEC with mitomycin-C in [place] by Dr. Daryl Eastern.  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  Completed second laparoscopic HIPEC on 06/25/18.  Again 1.5L removed.  09/12/18 underwent debulking at [facility] in [place], [place].  Removed 98% by Dr. [name].  Discharged on 09/30/18.  She had L pleural effusion requiring chest tube drainage; intra-abd abscess 10/2018; discharged on 11/04/18 with drain and IV cefepime and Diflucan. Her ureteral stents were removed and ostomy relocated in 10/2018.  Hospitalized in 01/2019 for AKI 2/2 dehydration and likely PE (V/Q scan and trop elevation). CEA increasing again. Repeat CT CAP 03/20/19 with stable to slight increase in cancer and RUL nodule.  Discussed observation vs treatment. She chose observation and is considering repeat CRS + HIPEC via Dr. [name] in [place].  CT scans from 09/24/19 of the abdomen, without contrast, appear stable.  The peritoneal thickening, carcinomatosis, retroperitoneal lymph nodes, a small amount of fluid collection or trace ascites, appear to be stable compared to 3 months ago. 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  CT scan on 08/17/20 showed a right lung mass that is slightly increased from 11 mm to 14 mm. There are some new lung nodules that have developed, a small one in the left lung, a new nodule in the right lung. Inside the abdomen we are seeing continued increase in size of the peritoneal nodules. The largest measuring 14 mm compared to 12 mm.  The nodule lower in the pelvis increased from 16 mm to 21 mm.  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  TMB = 1.3.  S/p CRS w/ ostomy revision and tumor removal around stoma on 10/14/20.  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 [name] PA from [facility] in [place] called to relay their TB recommendations for patient.  Recommendations to proceed with FOLFIRI/Avastin with holding Avastin 6-8 weeks post-op. They also request if our office can repeat genomic testing.  Repeat TEMPUS from 10/14/20.  Pre-screen for KRAS G12D inhibitor trial, if not eligible will plan for FOLFIRI +/- bevacizumab.          Interval Hx 01/30/22:  Pt is feeling better today. Her last treatment went better. Her energy has increased and she is doing more things around the house and with family. She denies any recent fevers or chills. She had some muscle and hand cramping the other day when her magnesium was low. This resolved after mag infusion. She needs this about once a week. Her hydration has been better and she is able to  eat a little more. Her weight is up a little. Her URI and UTI symptoms have resolved. She is tolerating treatment well with no new complaints.        Review of Systems   Constitutional: Positive for fatigue. Negative for activity change, appetite change, chills, diaphoresis, fever and unexpected weight change.   HENT: Negative for facial swelling, mouth sores, sore throat, trouble swallowing and voice change.    Eyes: Negative.  Negative for visual disturbance.   Respiratory: Negative for cough, chest tightness, shortness of breath and wheezing.    Cardiovascular: Negative.  Negative for chest pain, palpitations and leg swelling.   Gastrointestinal: Positive for diarrhea. Negative for abdominal distention, abdominal pain, anal bleeding, blood in stool, constipation, nausea, rectal pain and vomiting.   Genitourinary: Negative for difficulty urinating, dysuria and hematuria.   Musculoskeletal: Negative.  Negative for arthralgias, back pain, neck pain and neck stiffness.   Skin: Negative for color change, pallor, rash and wound.   Neurological: Positive for weakness. Negative for dizziness, light-headedness, numbness and headaches.   Hematological: Does not bruise/bleed easily.   Psychiatric/Behavioral: Negative for agitation. The patient is not nervous/anxious.        Medical History:   Diagnosis Date   ? Bundle branch block    ? Cancer [facility])    ? Cancer of appendix (HCC)    ? Cancer of colon (HCC) 11/19     Surgical History:   Procedure Laterality Date   ? CESAREAN SECTION  1981   ? HX CHOLECYSTECTOMY  1995   ? LAPAROSCOPY  02/2017    biopsy peritoneal   ? COLONOSCOPY DIAGNOSTIC WITH SPECIMEN COLLECTION BY BRUSHING/ WASHING - FLEXIBLE N/A 03/19/2017    Performed by Everardo All, MD at [facility] ENDO   ? COLONOSCOPY WITH BIOPSY - FLEXIBLE  03/19/2017    Performed by Everardo All, MD at [facility] ENDO   ? EXPLORATORY LAPAROTOMY, DIVERTING LOOP ILEOSTOMY N/A 04/03/2017    Performed by Loralie Champagne, MD at [facility] OR   ? INSERTION TUNNELED CENTRAL VENOUS CATHETER - AGE 55 YEARS AND OVER Left 04/23/2017    Performed by Loralie Champagne, MD at [facility] OR   ? CYSTOURETHROSCOPY WITH INDWELLING URETERAL STENT INSERTION Bilateral 04/09/2018    Performed by Earl Many, MD at [facility] OR   ? CYSTOURETHROSCOPY WITH URETERAL CATHETERIZATION WITH/ WITHOUT IRRIGATION/ INSTILLATION/ URETEROPYELOGRAPHY Bilateral 04/09/2018    Performed by Earl Many, MD at [facility] OR   ? CYSTOURETHROSCOPY WITH INDWELLING URETERAL STENT EXCHANGE (RIGHT 6 x 26cm / LEFT 6 x 28cm) Bilateral 08/08/2018    Performed by Earl Many, MD at [facility] OR   ? RETROGRADE UROGRAPHY WITH/ WITHOUT KUB Bilateral 08/08/2018    Performed by Earl Many, MD at [facility] OR   ? ENDOSCOPIC RETROGRADE CHOLANGIOPANCREATOGRAPHY WITH SPHINCTEROTOMY/ PAPILLOTOMY N/A 09/16/2021    Performed by Vertell Novak, MD at [facility] ENDO   ? ESOPHAGOGASTRODUODENOSCOPY WITH ENDOSCOPIC ULTRASOUND EXAMINATION - FLEXIBLE  09/16/2021    Performed by Vertell Novak, MD at [facility] ENDO   ? ENDOSCOPIC RETROGRADE CHOLANGIOPANCREATOGRAPHY WITH PLACEMENT ENDOSCOPIC STENT INTO BILIARY/ PANCREATIC DUCT - EACH STENT  09/16/2021    Performed by Vertell Novak, MD at [facility] ENDO   ? COLONOSCOPY DIAGNOSTIC WITH SPECIMEN COLLECTION BY BRUSHING/ WASHING - FLEXIBLE N/A 09/26/2021    Performed by Vertell Novak, MD at [facility] OR   ? ENDOSCOPIC RETROGRADE CHOLANGIOPANCREATOGRAPHY WITH PLACEMENT ENDOSCOPIC STENT INTO BILIARY/ PANCREATIC DUCT - EACH STENT  09/29/2021    Performed by Eliott Nine,  MD at [facility] ENDO   ? ENDOSCOPIC RETROGRADE CHOLANGIOPANCREATOGRAPHY with plastic stent exchange N/A 10/27/2021    Performed by Eliott Nine, MD at [facility] ENDO   ? ENDOSCOPIC RETROGRADE CHOLANGIOPANCREATOGRAPHY WITH ABLATION TUMOR/ POLYP/ OTHER LESION  10/27/2021    Performed by Eliott Nine, MD at [facility] ENDO   ? ENDOSCOPIC RETROGRADE CHOLANGIOPANCREATOGRAPHY WITH REMOVAL CALCULI/ DEBRIS FROM BILIARY/ PANCREATIC DUCT  10/27/2021    Performed by Eliott Nine, MD at [facility] ENDO   ? ENDOSCOPIC RETROGRADE CHOLANGIOPANCREATOGRAPHY  plastic stent replacement N/A 12/22/2021    Performed by Eliott Nine, MD at [facility] ENDO   ? COLON SURGERY  7/20   ? COLON SURGERY  7/20   ? HX APPENDECTOMY  1/10    Guess on date   ? HX CESAREAN SECTION     ? HX HYSTERECTOMY  7/20   ? HX LOWER ANTERIOR RESECTION OF COLON      7/20   ? HX SALPINGO-OOPHORECTOMY     ? ILEOSTOMY OR JEJUNOSTOMY Right    ? LIVER SURGERY  12/24/21   ? PORTACATH PLACEMENT  1/20   ? PR PNCRTECT PROX STOT W/PANCREATOJEJUNOSTOMY  7/20   ? TUNNELED VENOUS PORT PLACEMENT Left    ? URETER STENT PLACEMENT Bilateral      Family History   Problem Relation Age of Onset   ? Diabetes Mother    ? Cancer Father    ? Diabetes Brother    ? Cancer-Lung Paternal Aunt    ? Cancer-Breast Maternal Grandmother    ? Diabetes Paternal Grandmother      Social History     Socioeconomic History   ? Marital status: Married   Tobacco Use   ? Smoking status: Never   ? Smokeless tobacco: Never   Vaping Use   ? Vaping Use: Never used   Substance and Sexual Activity   ? Alcohol use: No     Comment: rarely   ? Drug use: No         OBJECTIVE:         ? amLODIPine (NORVASC) 10 mg tablet Take one tablet by mouth daily. (Patient taking differently: Take one tablet by mouth every morning.)   ? calcium carbonate (TUMS) 500 mg (200 mg elemental calcium) chewable tablet Chew one tablet by mouth every 6 hours as needed.   ? carvediloL (COREG) 6.25 mg tablet Take one tablet by mouth twice daily. Take with food.  Hold if systolic blood pressure < 110   ? Cetirizine (ZYRTEC) 10 mg cap Take one capsule by mouth as Needed.   ? ciprofloxacin (CIPRO) 250 mg tablet Take one tablet by mouth every 48 hours.   ? dexAMETHasone (DECADRON) 4 mg tablet 1 tab by mouth with breakfast and afternoon snack on days 2 & 3 following each chemotherapy cycle. Do not take after 6 pm to avoid insomnia.   ? diphenoxylate-atropine (LOMOTIL) 2.5-0.025 mg tablet Take one tablet by mouth four times daily as needed for Diarrhea.   ? famotidine (PEPCID AC) 10 mg tablet Take one tablet by mouth twice daily.   ? LIDO/ANTACID/APAP 1:3:1 SUSP (MIXTURE COMPOUND) Take 30 mL (1 cup) by mouth every 4 hours as needed.   ? loperamide (IMODIUM A-D) 2 mg capsule Take one pill by mouth four times daily as needed   ? LORazepam (ATIVAN) 0.5 mg tablet Take 1-2 tabs by mouth every 6 hrs as needed N/V not controlled by Zofran or Compazine. May also use every 6 hrs as needed anxiety  or at bedtime insomnia.   ? magnesium oxide 400 mg magnesium tablet Take one tablet by mouth twice daily. (Patient taking differently: Take one tablet by mouth twice daily as needed.)   ? ondansetron (ZOFRAN ODT) 8 mg rapid dissolve tablet Dissolve one tablet by mouth every 8 hours as needed for Nausea or Vomiting. Place on tongue to disolve.   ? potassium chloride (KLOR-CON SPRINKLE) 10 mEq capsule Take one capsule by mouth twice daily. If potassium < 3.5. Take with a meal and a full glass of water.   ? psyllium husk (METAMUCIL) 0.4 gram cap Take one capsule by mouth daily as needed.   ? sod chlor,sod bicarb/neti pot (SINUS WASH NETI POT SINI) Use as directed   ? sodium chloride 0.9% (NS) 0.9 % 1,000 mL with potassium chloride 2 mEq/mL 20 mEq, magnesium sulfate 4 mEq/mL (50 %) 2 g IV infusion Administer 1L NS with K and 2g Mg once weekly via outpatient infusion.   ? traMADoL (ULTRAM) 50 mg tablet Take one tablet to two tablets by mouth daily as needed for Pain.     Vitals:    01/30/22 1123   BP: 118/78   BP Source: Arm, Right Upper   Pulse: 73   Temp: 36.6 ?C (97.8 ?F)   Resp: 16   SpO2: 100%   TempSrc: Temporal   PainSc: Zero   Weight: 50.7 kg (111 lb 12.8 oz)     Body mass index is 20.45 kg/m?Marland Kitchen     Pain Score: Zero       Pain Addressed:  N/A    Patient Evaluated for a Clinical Trial: Patient not eligible for a treatment trial (including not needing treatment, needs palliative care, in remission).     [place] Cooperative Oncology Group performance status is 1, Restricted in physically strenuous activity but ambulatory and able to carry out work of a light or sedentary nature, e.g., light house work, office work.       Physical Exam  Vitals reviewed.   Constitutional:       General: She is not in acute distress.     Appearance: Normal appearance. She is well-developed. She is not ill-appearing, toxic-appearing or diaphoretic.   HENT:      Head: Normocephalic and atraumatic.      Nose: Nose normal. No rhinorrhea.      Mouth/Throat:      Mouth: Mucous membranes are moist. Mucous membranes are not pale. No oral lesions.      Pharynx: Oropharynx is clear. No oropharyngeal exudate or posterior oropharyngeal erythema.      Tonsils: No tonsillar abscesses.   Eyes:      General: No scleral icterus.        Right eye: No discharge.         Left eye: No discharge.      Extraocular Movements: Extraocular movements intact.      Conjunctiva/sclera: Conjunctivae normal.      Pupils: Pupils are equal, round, and reactive to light.   Cardiovascular:      Rate and Rhythm: Normal rate and regular rhythm.      Pulses: Normal pulses.      Heart sounds: Normal heart sounds. No murmur heard.     No gallop.   Pulmonary:      Effort: Pulmonary effort is normal. No respiratory distress.      Breath sounds: No stridor. No wheezing, rhonchi or rales.   Chest:      Chest wall: No  tenderness.      Comments: Port site WNL  Abdominal:      General: A surgical scar is present. Bowel sounds are normal. There is no distension.      Palpations: There is no mass.      Tenderness: There is no abdominal tenderness. There is no guarding or rebound.      Hernia: No hernia is present.          Comments: Firmness in epigastric area.    Musculoskeletal:         General: No swelling, tenderness, deformity or signs of injury. Normal range of motion.      Cervical back: Normal range of motion and neck supple. No rigidity. No muscular tenderness.      Right lower leg: No edema.      Left lower leg: No edema.   Lymphadenopathy:      Cervical: No cervical adenopathy.   Skin:     General: Skin is warm and dry.      Coloration: Skin is pale. Skin is not jaundiced.      Findings: No bruising, erythema, lesion or rash.   Neurological:      General: No focal deficit present.      Mental Status: She is alert and oriented to person, place, and time. Mental status is at baseline. Cranial Nerves: No cranial nerve deficit.      Motor: No weakness.      Coordination: Coordination normal.      Gait: Gait normal.   Psychiatric:         Mood and Affect: Mood normal.         Behavior: Behavior normal.         Thought Content: Thought content normal.         Judgment: Judgment normal.            CBC w/DIFF      Latest Ref Rng & Units 01/30/2022     9:36 AM 01/09/2022    10:47 AM 12/12/2021    10:41 AM 11/28/2021     9:05 AM 11/10/2021     8:52 AM   CBC with Diff   White Blood Cells 4.5 - 11.0 K/UL 7.4  15.5  5.9  7.2  12.7    RBC 4.0 - 5.0 M/UL 2.33  2.69  2.78  2.85  3.08    Hemoglobin 12.0 - 15.0 GM/DL 8.1  8.9  9.3  9.7  24.4    Hematocrit 36 - 45 % 24.5  27.2  28.1  28.8  31.2    MCV 80 - 100 FL 105.5  101.2  101.2  101.0  101.4    MCH 26 - 34 PG 34.8  33.2  33.4  33.9  33.5    MCHC 32.0 - 36.0 G/DL 01.0  27.2  53.6  64.4  33.0    RDW 11 - 15 % 17.7  16.4  15.4  14.8  15.0    Platelet Count 150 - 400 K/UL 366  452  367  402  508    MPV 7 - 11 FL 7.5  7.6  7.3  7.8  8.0    Neutrophils 41 - 77 % 36  68  34  32  52    Absolute Neutrophil Count 1.8 - 7.0 K/UL 2.70  10.30  2.00  2.30  6.70    Lymphocytes 24 - 44 % 36  19  45  41  32  Absolute Lymph Count 1.0 - 4.8 K/UL 2.70  3.00  2.70  3.00  4.10    Monocytes 4 - 12 % 22  12  18  22  14     Absolute Monocyte Count 0 - 0.80 K/UL 1.60  1.90  1.00  1.60  1.80    Eosinophils 0 - 5 % 5  1  3  4  1     Absolute Eosinophil Count 0 - 0.45 K/UL 0.30  0.20  0.20  0.30  0.10    Basophils 0 - 2 % 1  0  0  1  1    Absolute Basophil Count 0 - 0.20 K/UL 0.00  0.10  0.00  0.00  0.10      Comprehensive Metabolic Profile      Latest Ref Rng & Units 01/30/2022     9:36 AM 01/09/2022    10:47 AM 12/12/2021    10:41 AM 11/28/2021     9:05 AM 11/10/2021     8:52 AM   CMP   Sodium 137 - 147 MMOL/L 138  136  142  138  139    Potassium 3.5 - 5.1 MMOL/L 4.1  3.5  3.7  3.9  3.4    Chloride 98 - 110 MMOL/L 103  97  96  101  97    CO2 21 - 30 MMOL/L 26  30  37  32  33    Anion Gap 3 - 12 9  9  9  5  9     Blood Urea Nitrogen 7 - 25 MG/DL 21  25  23  20   34    Creatinine 0.4 - 1.00 MG/DL 1.61  0.96  0.45  4.09  2.15    Glucose 70 - 100 MG/DL 811  914  782  88  956    Calcium 8.5 - 10.6 MG/DL 9.0  8.5  9.1  9.0  9.3    Total Protein 6.0 - 8.0 G/DL 6.8  6.8  7.0  7.1  7.5    Albumin 3.5 - 5.0 G/DL 3.5  3.3  3.6  3.5  3.6    Alk Phosphatase 25 - 110 U/L 208  188  177  228  245    ALT (SGPT) 7 - 56 U/L 20  16  13  13  22     Total Bilirubin 0.3 - 1.2 MG/DL 0.5  0.7  0.7  0.7  0.9      Tumor Markers  Lab Results   Component Value Date    CEA 911.3 (H) 01/30/2022    CEA 838.2 (H) 01/09/2022    CEA 511.8 (H) 12/12/2021    CEA 761.7 (H) 11/28/2021    CEA 681.0 (H) 11/10/2021    CEA 482.6 (H) 10/12/2021    CEA 451.0 (H) 09/28/2021    CEA 470.1 (H) 08/31/2021    CEA 456.4 (H) 08/17/2021    CEA 436.3 (H) 08/03/2021    CA199 <1 01/30/2022    CA199 <1 01/09/2022    CA199 <1 12/12/2021    CA199 <1 11/28/2021    CA199 <1 11/10/2021    CA199 <1 09/28/2021    CA199 <1 08/31/2021    CA199 <1 08/17/2021    CA199 <1 08/03/2021    CA199 <1 07/20/2021    CA125 41 (H) 01/21/2018    CA125 42 (H) 11/21/2017    CA125 46 (H) 11/05/2017    CA125 41 (H) 10/22/2017    CA125 34 10/08/2017  CA125 34 09/24/2017    CA125 31 09/10/2017    CA125 29 08/27/2017    CA125 34 08/15/2017    CA125 39 (H) 07/30/2017           Imaging:  - 04/27/21 CT CAP without contrast  CHEST:   1. ?No significant change in small indeterminate anterior epiphrenic lymph nodes. No new thoracic lymphadenopathy.   2. No significant change to further mild increase in size of pulmonary and pleural metastases.   3. Increasing nodular and tree-in-bud opacities in the lingula and development of mild focal consolidation in the left lower lobe, indeterminate and may be infectious/inflammatory nodules or potentially pulmonary metastases. ?     ABDOMEN AND PELVIS:   1. ?Grossly unchanged extensive peritoneal carcinomatosis.   2. No significant change in small residual indeterminate retroperitoneal lymph nodes. No discrete new abdominopelvic lymphadenopathy.   3. Grossly unchanged right anterior abdominal wall metastases.     GI ENDO CATH BIL & PANC DUCT  This order has been auto finalized and does not contain a result.        ASSESSMENT AND PLAN:    1. Metastatic appendiceal carcinoma. Signet ring adenocarcinoma, MSS, with peritoneal carcinomatosis with  pseudomyxoma peritonei.  02/19/17 diagnostic laparoscopy with biopsies of the right diaphragm, right liver, right fallopian tube and left pelvis. 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  Planned for exploratory laparotomy with CRS/HIPEC in combination with a TAH/BSO in 03/2017.  Developed intestinal obstruction and taken to OR for CRS/HIPEC surgery; however multiple adhesions, omental cracking. CRS/HIPEC was aborted.  Immunotherapy on epacadostat?+ sirolimus?trial 12/24/17-05/21/18.  She stopped study drug on 05/21/18 for mitomycin HIPEC trial at [facility] [place].  On 05/28/18 diagnostic laparoscopy, evacuation of mucinous ascites 1.5L, peritoneal biopsy, laparoscopic HIPEC with mitomycin-C in [place] by Dr. Daryl Eastern.  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  Completed second laparoscopic HIPEC on 06/25/18.  Again 1.5L removed.  09/12/18 underwent debulking at [facility] in [place], [place].  Removed 98% by Dr. [name].  Discharged on 09/30/18.  She had L pleural effusion requiring chest tube drainage; intra-abd abscess 10/2018; discharged on 11/04/18 with drain and IV cefepime and Diflucan. Her ureteral stents were removed and ostomy relocated in 10/2018.  Hospitalized in 01/2019 for AKI 2/2 dehydration and likely PE (V/Q scan and trop elevation). CEA increasing again. Repeat CT CAP 03/20/19 with stable to slight increase in cancer and RUL nodule.  Discussed observation vs treatment. She chose observation and is considering repeat CRS + HIPEC via Dr. [name] in [place].  CT scans from 09/24/19 of the abdomen, without contrast, appear stable.  The peritoneal thickening, carcinomatosis, retroperitoneal lymph nodes, a small amount of fluid collection or trace ascites, appear to be stable compared to 3 months ago. 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  CT scan on 08/17/20 showed a right lung mass that is slightly increased from 11 mm to 14 mm. There are some new lung nodules that have developed, a small one in the left lung, a new nodule in the right lung. Inside the abdomen we are seeing continued increase in size of the peritoneal nodules. The largest measuring 14 mm compared to 12 mm.  The nodule lower in the pelvis increased from 16 mm to 21 mm.  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  TMB = 1.3.  S/p CRS w/ ostomy revision and tumor removal around stoma on 10/14/20.  Post-op course complicated by high-output  ileostomy, anemia 2/2 acute blood loss and chronic stage 3A CKD, and leukocytosis. [name] PA from [facility] in [place] called to relay their TB recommendations for patient.  Recommendations to proceed with FOLFIRI/Avastin with holding Avastin 6-8 weeks post-op. They also request if our office can repeat genomic testing.  Repeat TEMPUS from 10/14/20.  Pre-screen for KRAS G12D inhibitor trial, if not eligible will plan for FOLFIRI +/- bevacizumab. Tolerated C1 FOLFIRI but more diarrhea and N/V after C2. Decreased irinotecan to 130 mg/m2 due to diarrhea. S/p C4, with bevacizumab added on. CT scan 02/16/21 abdominal wall spots are under control and stable. There is a slight increase in size of the lung nodules. The radiologist is measuring 10 mm to 8 mm in 11/2020. The other right lung nodule is more or less the same at 15 mm. CT scans with stable disease, mild increase in size of pulmonary metastases (specifically the right upper lobe nodule 2.2cm, previously 1.5cm). There is also some increasing nodular and tree-in-bud opacities in the lingula and development of mild focal consolidation in the left lower lobe, indeterminate but may be related to recent COVID infection vs pulmonary metastases. Given overall stability and down-trending CEA, we will plan to continue with the current treatment regimen for now. Dr. Dorita Fray with Radiation Oncology reviewed imaging (already received 50Gy to right lung 3/1-05/25/20) and will continue to monitor. CT scan on 08/03/2021 shows the cancer inside the abdomen and the abdominal wall metastasis is remaining the same compared to the scans done in 04/2021. The lung metastasis appears slightly larger by about 2 mm. Abnormal labs, even on repeat. Chemo held. Ultrasound reveals moderate ductal dilatation. Refer to GI for ERCP. Continued elevation of liver enzymes and bilirubin. MRI MRCP performed on 09/07/2021 shows extrinsic compression of the common bile duct causing biliary dilatation, as well as both intrahepatic ducts on the right and left side. CT scan 09/28/2021 shows the cancer was overall stable. However, the fact that it is causing biliary obstruction, ureter obstruction, and hydronephrosis tells [place] that clinically it is growing. There was mild hydronephrosis on both kidneys. cycle 18 of FOLFIRI,  stent replacement in 3 weeks, will hold bevacizumab. In October, re-discuss decreasing her Irinotecan to every 4 weeks if no worsening of labs and/or symptoms. CT scan on 12/12/2021 shows the peritoneal mass is stable. The dilation of the bile ducts has improved. The nodules in the lung are stable. There are new cluster of nodules which appear like inflammation.     Plan 01/30/22:  URI symptoms resolved. No UTI symptoms. Labs stable. Slight drop in hemoglobin, denies bleeding.   Proceed with cycle 21 today with the 5-FU pump and irinotecan, bevacizumab  Continue IVF at home as before.   RTC in 2 weeks with labs and visit for C22, 4 weeks for C23, then C24 on 03/21/22, then CT scans and follow up with Dr. Josiah Lobo 04/27/22 (pt gone for vacation).   Repeat imaging deferred until February, as pt is going on vacation.   Tumor marker up today. Monitor with each visit.   Continue supportive measures. Pt to call with any questions, concerns or symptoms at any time and we can see her sooner.     2. Elevated creatinine, s/p ureteral stent. Her ureteral stents were removed 10/2018. Encouraged hydration. S/p IVF 1L NS and 1g Mg weekly x 4 weeks. Referred to Nephrology as Cr is improving with IVF. She is following with Dr. Marjie Skiff in Nephrology. Cr 2.48 after GI virus. Decreased irinotecan  to 130 mg/m2. Continue IV fluids 3 times a week locally, as we anticipate more diarrhea with irinotecan. Creatinine was greater than 4 with infection. Cr 2.09 today. Has seen urology for chronic infections. Is going on chronic antibiotics to control. Now on 250 cipro every other day. Continue IVF closer to home. Stable at 2.13.    3. Diarrhea. Better with Lomotil daily. Continue IV fluids 3 times a week locally. Increased her Lomotil to 2 tablets 4 times daily. She will continue to take Metamucil to bulk up her stools. She had a day of decreased stool output but was able to flush the Metamucil out with an enema. Bowels are back to baseline now. Continue to take Imodium as needed, okay to increase to 2 tabs at a time as needed. Better with advanced diet. Obstruction with too much fiber. Better with break in chemo. Monitor. IVF as needed throughout week. Improved. Continue Imodium and Lomotil as needed.     4.  Fatigue. Improved now that infections are better. More with on-going chemo. Improved with break. Better this week, infections improved.       5. Hypomagnesemia. Due to large volume fluid loss. 1.4 today. Replace in treatment. Check phos levels; normal at 3.3    6. Urinary tract infection. Now managed by urology. On suppressive antibiotics cipro 250 mg every 3 days. Follow up with her urologist. Minimal increase in symptoms. Follow up with urology    7. Elevated LFTs. May be irinotecan, tylenol, cipro. Hold tylenol. Continue antibiotics. Improved after holding tylenol. Now elevated again with bilirubin 2.7, AST 66 and ALT 106. Some improvement after IVF but bilirubin still elevated at 2.3. ultrasound of abdomen to assess for biliary obstruction/dilation. This revealed moderate intrahepatic bile duct dilatation. 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 Refer to GI for possible ERCP. Stent placed. Now bili higher at 4.1 and LFTs higher. White count elevated. Admitted to hospital for repeat ERCP. They were able to extend stent, no PTC. Bilirubin now 1.4. ok to resume treatment. Repeat scope 9/7. Now normal after stent adjustment. Replace beginning of January 2024       Turner Daniels, APRN-NP        The patient and family were allowed to ask questions and voice concerns; these were addressed to the best of our ability. They expressed understanding of what was explained to them, and they agreed with the present plan. My collaborating physician on this case is Dr. Josiah Lobo.

## 2022-01-30 ENCOUNTER — Encounter: Admit: 2022-01-30 | Discharge: 2022-01-30 | Payer: MEDICARE

## 2022-01-30 DIAGNOSIS — C181 Malignant neoplasm of appendix: Secondary | ICD-10-CM

## 2022-01-30 DIAGNOSIS — C189 Malignant neoplasm of colon, unspecified: Secondary | ICD-10-CM

## 2022-01-30 DIAGNOSIS — C801 Malignant (primary) neoplasm, unspecified: Secondary | ICD-10-CM

## 2022-01-30 DIAGNOSIS — I454 Nonspecific intraventricular block: Secondary | ICD-10-CM

## 2022-01-30 DIAGNOSIS — C786 Secondary malignant neoplasm of retroperitoneum and peritoneum: Secondary | ICD-10-CM

## 2022-01-30 LAB — COMPREHENSIVE METABOLIC PANEL
ALK PHOSPHATASE: 208 U/L — ABNORMAL HIGH (ref 25–110)
ALT: 20 U/L — ABNORMAL HIGH (ref 7–56)
CREATININE: 2.1 mg/dL — ABNORMAL HIGH (ref 0.4–1.00)
SODIUM: 138 MMOL/L (ref 137–147)
TOTAL PROTEIN: 6.8 g/dL (ref 6.0–8.0)

## 2022-01-30 LAB — CBC AND DIFF
ABSOLUTE BASO COUNT: 0 K/UL (ref 0–0.20)
ABSOLUTE EOS COUNT: 0.3 K/UL (ref 0–0.45)
ABSOLUTE LYMPH COUNT: 2.7 K/UL (ref 1.0–4.8)
ABSOLUTE MONO COUNT: 1.6 K/UL — ABNORMAL HIGH (ref 0–0.80)
ABSOLUTE NEUTROPHIL: 2.7 K/UL (ref 1.8–7.0)
BASOPHILS %: 1 % — ABNORMAL LOW (ref >60)
EOSINOPHILS %: 5 % (ref 0–5)
LYMPHOCYTES %: 36 % (ref 24–44)
MCH: 34 pg — ABNORMAL HIGH (ref 26–34)
NEUTROPHILS %: 36 % — ABNORMAL LOW (ref 41–77)
PLATELET COUNT: 366 K/UL (ref 150–400)
RDW: 17 % — ABNORMAL HIGH (ref 11–15)
WBC COUNT: 7.4 K/UL (ref 4.5–11.0)

## 2022-01-30 LAB — MAGNESIUM: MAGNESIUM: 1.2 mg/dL — ABNORMAL LOW (ref 1.6–2.6)

## 2022-01-30 LAB — CA19.9: CA 19-9: <1 U/mL — ABNORMAL LOW (ref <35)

## 2022-01-30 LAB — CEA(CARCINOEMBRYONIC AG): CEA: 911 ng/mL — ABNORMAL HIGH (ref <3.0)

## 2022-01-30 MED ORDER — FLUOROURACIL IV AMB PUMP
2400 mg/m2 | Freq: Once | INTRAVENOUS | 0 refills
Start: 2022-01-30 — End: ?

## 2022-01-30 MED ORDER — FLUOROURACIL IV AMB PUMP
2400 mg/m2 | Freq: Once | INTRAVENOUS | 0 refills | Status: CP
Start: 2022-01-30 — End: ?
  Administered 2022-01-30 (×4): 3624 mg via INTRAVENOUS

## 2022-01-30 MED ORDER — ATROPINE 0.4 MG/ML IJ SOLN
0.4 mg | INTRAVENOUS | 0 refills | PRN
Start: 2022-01-30 — End: ?

## 2022-01-30 MED ORDER — ATROPINE 0.4 MG/ML IJ SOLN
.4 mg | INTRAVENOUS | 0 refills | Status: AC | PRN
Start: 2022-01-30 — End: ?
  Administered 2022-01-30: 21:00:00 0.4 mg via INTRAVENOUS

## 2022-01-30 MED ORDER — DEXAMETHASONE 4 MG PO TAB
4 mg | Freq: Once | ORAL | 0 refills
Start: 2022-01-30 — End: ?

## 2022-01-30 MED ORDER — DEXAMETHASONE 4 MG PO TAB
4 mg | Freq: Once | ORAL | 0 refills | Status: CN
Start: 2022-01-30 — End: ?

## 2022-01-30 MED ORDER — PALONOSETRON 0.25 MG/5 ML IV SOLN
.25 mg | Freq: Once | INTRAVENOUS | 0 refills
Start: 2022-01-30 — End: ?

## 2022-01-30 MED ORDER — IRINOTECAN IVPB
110 mg/m2 | Freq: Once | INTRAVENOUS | 0 refills | Status: CP
Start: 2022-01-30 — End: ?
  Administered 2022-01-30 (×3): 166.2 mg via INTRAVENOUS

## 2022-01-30 MED ORDER — DEXAMETHASONE 4 MG PO TAB
4 mg | Freq: Once | ORAL | 0 refills | Status: CP
Start: 2022-01-30 — End: ?
  Administered 2022-01-30: 19:00:00 4 mg via ORAL

## 2022-01-30 MED ORDER — BEVACIZUMAB-AWWB IVPB
5 mg/kg | Freq: Once | INTRAVENOUS | 0 refills | Status: CP
Start: 2022-01-30 — End: ?
  Administered 2022-01-30 (×2): 260.5 mg via INTRAVENOUS

## 2022-01-30 MED ORDER — MAGNESIUM SULFATE IN D5W 1 GRAM/100 ML IV PGBK (CC ONLY)
1 g | INTRAVENOUS | 0 refills | Status: CP
Start: 2022-01-30 — End: ?
  Administered 2022-01-30 (×2): 1 g via INTRAVENOUS

## 2022-01-30 MED ORDER — PALONOSETRON 0.25 MG/5 ML IV SOLN
.25 mg | Freq: Once | INTRAVENOUS | 0 refills | Status: CP
Start: 2022-01-30 — End: ?
  Administered 2022-01-30: 19:00:00 0.25 mg via INTRAVENOUS

## 2022-01-30 MED ORDER — SODIUM CHLORIDE 0.9 % IV SOLP
1000 mL | Freq: Once | INTRAVENOUS | 0 refills | Status: CP
Start: 2022-01-30 — End: ?
  Administered 2022-01-30: 19:00:00 1000 mL via INTRAVENOUS

## 2022-01-30 NOTE — Patient Instructions
[facility]  Chemotherapy Instructions    Call Immediately to report the following:  Unexplained bleeding or bleeding that will not stop  Difficulty swallowing  Shortness of breath, wheezing, or trouble breathing  Rapid, irregular heartbeat; chest pain  Dizziness, lightheadedness  Rash or cut that swells or turns red, feels hot or painful, or begin to ooze  Diarrhea   Uncontrolled nausea or vomiting  Fever of 100.4 F or higher, or chills    Important Phone Numbers:  Cancer Center Main Number (answered 24 hours a day) [phone]  Cancer Center Scheduling (appointments) [phone]  Social Worker [phone]  Nutritionist [phone]       Port Maintenance - If you have a port, it should be flushed every 6-8 weeks when not in use.  Please check with your MD, nurse, or the scheduler.

## 2022-01-30 NOTE — Progress Notes
CHEMO NOTE  Verified chemo consent signed and in chart.    Blood return positive via: Port (Single)    BSA and dose double checked (agree with orders as written) with: yes see mar    Labs/applicable tests checked: CBC and Comprehensive Metabolic Panel (CMP)    Chemo regimen: C21D1    bevacizumab-awwb (MVASI) 260.5 mg in sodium chloride 0.9% (NS) 110.42 mL IVPB    irinotecan (CAMPTOSAR) 166.2 mg in dextrose 5% (D5W) 508.31 mL IVPB    fluorouraciL (ADRUCIL) 3,624 mg in sodium chloride 0.9% (NS) 92 mL IV amb pump    Rate verified and armband double check with second RN: yes    Patient education offered and stated understanding. Denies questions at this time.    Patient presented to clinic for C21D1 mvasi + irinotecan + adrucil pump. Patient seen in clinic by Ambrose Mantle, APRN-NP. Patient's port accessed with positive blood return. Patient's labs okay to treat. Premedications given 30 minutes prior to infusion. Patient tolerated infusion well.    Report given to Raquel Sarna, RN to continue plan of care.

## 2022-01-31 NOTE — Progress Notes
Assumed care of patient. Magnesium infusion completed without issue. PAC positive for blood return and connected to 5FU ambulatory pump; all connections secure and clamps open.Pump running at time of dc. Patient dismissed ambulatory.

## 2022-02-06 ENCOUNTER — Encounter: Admit: 2022-02-06 | Discharge: 2022-02-06 | Payer: MEDICARE

## 2022-02-13 ENCOUNTER — Encounter: Admit: 2022-02-13 | Discharge: 2022-02-13 | Payer: MEDICARE

## 2022-02-13 DIAGNOSIS — C189 Malignant neoplasm of colon, unspecified: Secondary | ICD-10-CM

## 2022-02-13 DIAGNOSIS — C801 Malignant (primary) neoplasm, unspecified: Secondary | ICD-10-CM

## 2022-02-13 DIAGNOSIS — I454 Nonspecific intraventricular block: Secondary | ICD-10-CM

## 2022-02-13 DIAGNOSIS — C181 Malignant neoplasm of appendix: Secondary | ICD-10-CM

## 2022-02-13 DIAGNOSIS — C786 Secondary malignant neoplasm of retroperitoneum and peritoneum: Secondary | ICD-10-CM

## 2022-02-13 LAB — CBC AND DIFF
HEMOGLOBIN: 9 g/dL — ABNORMAL LOW (ref <3.0)
MCH: 35 pg — ABNORMAL HIGH (ref 26–34)
MCHC: 33 g/dL (ref 32.0–36.0)
MCV: 106 FL — ABNORMAL HIGH (ref 80–100)
MPV: 7.7 FL — ABNORMAL HIGH (ref 7–11)
NEUTROPHILS %: 44 % (ref 41–77)
PLATELET COUNT: 388 K/UL (ref 150–400)
WBC COUNT: 6.8 K/UL (ref 4.5–11.0)

## 2022-02-13 LAB — MAGNESIUM: MAGNESIUM: 1.3 mg/dL — ABNORMAL LOW (ref 1.6–2.6)

## 2022-02-13 LAB — CA19.9: CA 19-9: <1 U/mL — ABNORMAL LOW (ref <35)

## 2022-02-13 LAB — COMPREHENSIVE METABOLIC PANEL
SODIUM: 138 MMOL/L (ref 137–147)
TOTAL BILIRUBIN: 0.6 mg/dL — ABNORMAL HIGH (ref 0.3–1.2)

## 2022-02-13 MED ORDER — ONDANSETRON 8 MG PO TBDI
8 mg | ORAL_TABLET | ORAL | 3 refills | 8.00000 days | Status: AC | PRN
Start: 2022-02-13 — End: ?

## 2022-02-13 MED ORDER — DEXAMETHASONE 4 MG PO TAB
4 mg | Freq: Once | ORAL | 0 refills | Status: CP
Start: 2022-02-13 — End: ?
  Administered 2022-02-13: 19:00:00 4 mg via ORAL

## 2022-02-13 MED ORDER — ATROPINE 0.4 MG/ML IJ SOLN
.4 mg | INTRAVENOUS | 0 refills | Status: AC | PRN
Start: 2022-02-13 — End: ?
  Administered 2022-02-13: 21:00:00 0.4 mg via INTRAVENOUS

## 2022-02-13 MED ORDER — SODIUM CHLORIDE 0.9 % IV SOLP
1000 mL | Freq: Once | INTRAVENOUS | 0 refills | Status: CP
Start: 2022-02-13 — End: ?
  Administered 2022-02-13: 19:00:00 1000 mL via INTRAVENOUS

## 2022-02-13 MED ORDER — BEVACIZUMAB-AWWB IVPB
5 mg/kg | Freq: Once | INTRAVENOUS | 0 refills | Status: CP
Start: 2022-02-13 — End: ?
  Administered 2022-02-13 (×2): 260.5 mg via INTRAVENOUS

## 2022-02-13 MED ORDER — PALONOSETRON 0.25 MG/5 ML IV SOLN
.25 mg | Freq: Once | INTRAVENOUS | 0 refills | Status: CP
Start: 2022-02-13 — End: ?
  Administered 2022-02-13: 19:00:00 0.25 mg via INTRAVENOUS

## 2022-02-13 MED ORDER — FLUOROURACIL IV AMB PUMP
2400 mg/m2 | Freq: Once | INTRAVENOUS | 0 refills | Status: CP
Start: 2022-02-13 — End: ?
  Administered 2022-02-13 (×3): 3624 mg via INTRAVENOUS

## 2022-02-13 MED ORDER — DIPHENOXYLATE-ATROPINE 2.5-0.025 MG PO TAB
1 | ORAL_TABLET | Freq: Four times a day (QID) | ORAL | 3 refills | 15.00000 days | Status: AC | PRN
Start: 2022-02-13 — End: ?

## 2022-02-13 MED ORDER — DIPHENOXYLATE-ATROPINE 2.5-0.025 MG PO TAB
1 | ORAL_TABLET | Freq: Four times a day (QID) | ORAL | 3 refills | 15.00000 days | Status: DC | PRN
Start: 2022-02-13 — End: 2022-02-13

## 2022-02-13 MED ORDER — MAGNESIUM SULFATE IN D5W 1 GRAM/100 ML IV PGBK (CC ONLY)
1 g | Freq: Once | INTRAVENOUS | 0 refills | Status: CP
Start: 2022-02-13 — End: ?
  Administered 2022-02-13: 19:00:00 1 g via INTRAVENOUS

## 2022-02-13 MED ORDER — IRINOTECAN IVPB
110 mg/m2 | Freq: Once | INTRAVENOUS | 0 refills | Status: CP
Start: 2022-02-13 — End: ?
  Administered 2022-02-13 (×3): 166.2 mg via INTRAVENOUS

## 2022-02-13 NOTE — Patient Instructions
[facility]  Chemotherapy Instructions    Call Immediately to report the following:  Unexplained bleeding or bleeding that will not stop  Difficulty swallowing  Shortness of breath, wheezing, or trouble breathing  Rapid, irregular heartbeat; chest pain  Dizziness, lightheadedness  Rash or cut that swells or turns red, feels hot or painful, or begin to ooze  Diarrhea   Uncontrolled nausea or vomiting  Fever of 100.4 F or higher, or chills    Important Phone Numbers:  [facility] Number (answered 24 hours a day) [place] (appointments) [phone]  Social Worker [phone]  Nutritionist [phone]       Port Maintenance - If you have a port, it should be flushed every 6-8 weeks when not in use.  Please check with your MD, nurse, or the scheduler.      Ambulatory Pump Instructions for Home Use    Check that the word RUN appears on the screen and the numbers on the screen are counting down.    If error message appears on the screen and/or the pump alarms for reasons besides "Low" or "Empty", contact [facility] [phone].  When your medicine is almost done, the numbers on your screen will be replaced by "Low" the pump will continue to infuse.  Do not stop the medicine.  When the medicine is gone the word "Empty"  will appear.  At this time, press and hold the STOP button, then you can turn it OFF.    Do not get the dressing or pump wet.  Do not drop or strike against hard surfaces.  Protect tubing from hot surfaces and flames.  Do not allow pets to chew or play with the tubing.  If you notice that the area surrounding your port is red, swollen, or tender, please call the clinic at [phone]  A Chemotherapy spill kit was provided at first cycle.  Please let us know if you need another kit at any time.

## 2022-02-13 NOTE — Progress Notes
CHEMO NOTE  Verified chemo consent signed and in chart.    Blood return positive via: Port (Single)    BSA and dose double checked (agree with orders as written) with: yes see mar    Labs/applicable tests checked: CBC and Comprehensive Metabolic Panel (CMP)    Chemo regimen: C22D1    bevacizumab-awwb (MVASI) 260.5 mg in sodium chloride 0.9% (NS) 110.42 mL IVPB    irinotecan (CAMPTOSAR) 166.2 mg in dextrose 5% (D5W) 508.31 mL IVPB    fluorouraciL (ADRUCIL) 3,624 mg in sodium chloride 0.9% (NS) 92 mL IV amb pump    Rate verified and armband double check with second RN: yes    Patient education offered and stated understanding. Denies questions at this time.    Patient presented to clinic for C22D1 mvasi + irinotecan + amb pump. Patient seen in clinic with Kasi, Anup, MD. Patient's port flushed with positive blood return. Patient's labs okay to treat. Premedications given prior to infusion. Patient tolerated infusion well. Patient's port connected to ambulatory pump. Patient educated to call clinic if any concerns arise. Patient discharged off unit in stable condition.

## 2022-02-27 NOTE — Progress Notes
Date of Service: 02/28/2022    SUBJECTIVE:             Reason for Visit:  Follow Up    Vicki Buchanan is a 66 y.o. 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  02/19/17 diagnostic laparoscopy with biopsies of the right diaphragm, right liver, right fallopian tube and left pelvis.  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  Planned for exploratory laparotomy with CRS/HIPEC in combination with a TAH/BSO in 03/2017.  Developed intestinal obstruction and taken to OR for CRS/HIPEC surgery; however multiple adhesions, omental cracking. CRS/HIPEC was aborted.  Immunotherapy on epacadostat + sirolimus trial 12/24/17-05/21/18.  She stopped study drug on 05/21/18 for mitomycin HIPEC trial at [facility] [place].  On 05/28/18 diagnostic laparoscopy, evacuation of mucinous ascites 1.5L, peritoneal biopsy, laparoscopic HIPEC with mitomycin-C in [place] by Dr. Daryl Eastern.  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  Completed second laparoscopic HIPEC on 06/25/18.  Again 1.5L removed.  09/12/18 underwent debulking at [facility] in [place], [place].  Removed 98% by Dr. [name].  Discharged on 09/30/18.  She had L pleural effusion requiring chest tube drainage; intra-abd abscess 10/2018; discharged on 11/04/18 with drain and IV cefepime and Diflucan. Her ureteral stents were removed and ostomy relocated in 10/2018.  Hospitalized in 01/2019 for AKI 2/2 dehydration and likely PE (V/Q scan and trop elevation). CEA increasing again. Repeat CT CAP 03/20/19 with stable to slight increase in cancer and RUL nodule.  Discussed observation vs treatment. She chose observation and is considering repeat CRS + HIPEC via Dr. [name] in [place].  CT scans from 09/24/19 of the abdomen, without contrast, appear stable.  The peritoneal thickening, carcinomatosis, retroperitoneal lymph nodes, a small amount of fluid collection or trace ascites, appear to be stable compared to 3 months ago. 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  CT scan on 08/17/20 showed a right lung mass that is slightly increased from 11 mm to 14 mm. There are some new lung nodules that have developed, a small one in the left lung, a new nodule in the right lung. Inside the abdomen we are seeing continued increase in size of the peritoneal nodules. The largest measuring 14 mm compared to 12 mm.  The nodule lower in the pelvis increased from 16 mm to 21 mm.  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  TMB = 1.3.  S/p CRS w/ ostomy revision and tumor removal around stoma on 10/14/20.  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 [name] PA from [facility] in [place] called to relay their TB recommendations for patient.  Recommendations to proceed with FOLFIRI/Avastin with holding Avastin 6-8 weeks post-op. They also request if our office can repeat genomic testing.  Repeat TEMPUS from 10/14/20.  Pre-screen for KRAS G12D inhibitor trial, if not eligible will plan for FOLFIRI +/- bevacizumab.        Interval Hx 02/28/22:  Patient is here for a follow up visit. She reports after her last cycle she felt more fatigued. She got really dehydrated over the weekend and did not have much energy. She got IVF yesterday, 2 liters, and felt better. She got her flu and RSV vaccines last week. She denies fevers or chills. Her diarrhea is about the same. Otherwise no other complaints today  Review of Systems   Constitutional:  Positive for fatigue. Negative for activity change, appetite change, chills, diaphoresis, fever and unexpected weight change.   HENT:  Negative for facial swelling, mouth sores, sore throat, trouble swallowing and voice change.    Eyes: Negative.  Negative for visual disturbance.   Respiratory:  Negative for cough, chest tightness, shortness of breath and wheezing.    Cardiovascular: Negative.  Negative for chest pain, palpitations and leg swelling.   Gastrointestinal:  Positive for diarrhea. Negative for abdominal distention, abdominal pain, anal bleeding, blood in stool, constipation, nausea, rectal pain and vomiting.   Genitourinary:  Negative for difficulty urinating, dysuria and hematuria.   Musculoskeletal: Negative.  Negative for arthralgias, back pain, neck pain and neck stiffness.   Skin:  Negative for color change, pallor, rash and wound.   Neurological:  Positive for weakness and numbness. Negative for dizziness, light-headedness and headaches.   Hematological:  Does not bruise/bleed easily.   Psychiatric/Behavioral:  Negative for agitation. The patient is not nervous/anxious.        Medical History:   Diagnosis Date    Bundle branch block     Cancer (HCC)     Cancer of appendix (HCC)     Cancer of colon (HCC) 11/19     Surgical History:   Procedure Laterality Date    CESAREAN SECTION  1981    HX CHOLECYSTECTOMY  1995    LAPAROSCOPY  02/2017    biopsy peritoneal    COLONOSCOPY DIAGNOSTIC WITH SPECIMEN COLLECTION BY BRUSHING/ WASHING - FLEXIBLE N/A 03/19/2017    Performed by Everardo All, MD at [facility] ENDO    COLONOSCOPY WITH BIOPSY - FLEXIBLE  03/19/2017    Performed by Everardo All, MD at [facility] ENDO    EXPLORATORY LAPAROTOMY, DIVERTING LOOP ILEOSTOMY N/A 04/03/2017    Performed by Loralie Champagne, MD at [facility] OR    INSERTION TUNNELED CENTRAL VENOUS CATHETER - AGE 20 YEARS AND OVER Left 04/23/2017    Performed by Loralie Champagne, MD at [facility] OR    CYSTOURETHROSCOPY WITH INDWELLING URETERAL STENT INSERTION Bilateral 04/09/2018    Performed by Earl Many, MD at [facility] OR    CYSTOURETHROSCOPY WITH URETERAL CATHETERIZATION WITH/ WITHOUT IRRIGATION/ INSTILLATION/ URETEROPYELOGRAPHY Bilateral 04/09/2018    Performed by Earl Many, MD at [facility] OR    CYSTOURETHROSCOPY WITH INDWELLING URETERAL STENT EXCHANGE (RIGHT 6 x 26cm / LEFT 6 x 28cm) Bilateral 08/08/2018    Performed by Earl Many, MD at [facility] OR    RETROGRADE UROGRAPHY WITH/ WITHOUT KUB Bilateral 08/08/2018    Performed by Earl Many, MD at [facility] OR    ENDOSCOPIC RETROGRADE CHOLANGIOPANCREATOGRAPHY WITH SPHINCTEROTOMY/ PAPILLOTOMY N/A 09/16/2021    Performed by Vertell Novak, MD at [facility] ENDO    ESOPHAGOGASTRODUODENOSCOPY WITH ENDOSCOPIC ULTRASOUND EXAMINATION - FLEXIBLE  09/16/2021    Performed by Vertell Novak, MD at [facility] ENDO    ENDOSCOPIC RETROGRADE CHOLANGIOPANCREATOGRAPHY WITH PLACEMENT ENDOSCOPIC STENT INTO BILIARY/ PANCREATIC DUCT - EACH STENT  09/16/2021    Performed by Vertell Novak, MD at [facility] ENDO    COLONOSCOPY DIAGNOSTIC WITH SPECIMEN COLLECTION BY BRUSHING/ WASHING - FLEXIBLE N/A 09/26/2021    Performed by Vertell Novak, MD at [facility] OR    ENDOSCOPIC RETROGRADE CHOLANGIOPANCREATOGRAPHY WITH PLACEMENT ENDOSCOPIC STENT INTO BILIARY/ PANCREATIC DUCT - EACH STENT  09/29/2021    Performed by Eliott Nine, MD at [facility] ENDO    ENDOSCOPIC RETROGRADE CHOLANGIOPANCREATOGRAPHY with plastic stent exchange N/A 10/27/2021    Performed by Eliott Nine,  MD at [facility] ENDO    ENDOSCOPIC RETROGRADE CHOLANGIOPANCREATOGRAPHY WITH ABLATION TUMOR/ POLYP/ OTHER LESION  10/27/2021    Performed by Eliott Nine, MD at [facility] ENDO    ENDOSCOPIC RETROGRADE CHOLANGIOPANCREATOGRAPHY WITH REMOVAL CALCULI/ DEBRIS FROM BILIARY/ PANCREATIC DUCT  10/27/2021    Performed by Eliott Nine, MD at [facility] ENDO    ENDOSCOPIC RETROGRADE CHOLANGIOPANCREATOGRAPHY  plastic stent replacement N/A 12/22/2021    Performed by Eliott Nine, MD at [facility] ENDO    COLON SURGERY  7/20    COLON SURGERY  7/20    HX APPENDECTOMY  1/10    Guess on date    HX CESAREAN SECTION      HX HYSTERECTOMY  7/20    HX LOWER ANTERIOR RESECTION OF COLON      7/20    HX SALPINGO-OOPHORECTOMY      ILEOSTOMY OR JEJUNOSTOMY Right     LIVER SURGERY  12/24/21    PORTACATH PLACEMENT  1/20    PR PNCRTECT PROX STOT W/PANCREATOJEJUNOSTOMY  7/20    TUNNELED VENOUS PORT PLACEMENT Left     URETER STENT PLACEMENT Bilateral      Family History   Problem Relation Age of Onset    Diabetes Mother     Cancer Father     Diabetes Brother     Cancer-Lung Paternal Aunt     Cancer-Breast Maternal Grandmother     Diabetes Paternal Grandmother      Social History     Socioeconomic History    Marital status: Married   Tobacco Use    Smoking status: Never    Smokeless tobacco: Never   Vaping Use    Vaping Use: Never used   Substance and Sexual Activity    Alcohol use: No     Comment: rarely    Drug use: No         OBJECTIVE:          amLODIPine (NORVASC) 10 mg tablet Take one tablet by mouth daily. (Patient taking differently: Take one tablet by mouth every morning.)    calcium carbonate (TUMS) 500 mg (200 mg elemental calcium) chewable tablet Chew one tablet by mouth every 6 hours as needed.    carvediloL (COREG) 6.25 mg tablet Take one tablet by mouth twice daily. Take with food.  Hold if systolic blood pressure < 110    Cetirizine (ZYRTEC) 10 mg cap Take one capsule by mouth as Needed.    ciprofloxacin (CIPRO) 250 mg tablet Take one tablet by mouth every 48 hours.    dexAMETHasone (DECADRON) 4 mg tablet 1 tab by mouth with breakfast and afternoon snack on days 2 & 3 following each chemotherapy cycle. Do not take after 6 pm to avoid insomnia.    diphenoxylate-atropine (LOMOTIL) 2.5-0.025 mg tablet Take one tablet by mouth four times daily as needed for Diarrhea.    famotidine (PEPCID AC) 10 mg tablet Take one tablet by mouth twice daily.    LIDO/ANTACID/APAP 1:3:1 SUSP (MIXTURE COMPOUND) Take 30 mL (1 cup) by mouth every 4 hours as needed.    loperamide (IMODIUM A-D) 2 mg capsule Take one pill by mouth four times daily as needed    LORazepam (ATIVAN) 0.5 mg tablet Take 1-2 tabs by mouth every 6 hrs as needed N/V not controlled by Zofran or Compazine. May also use every 6 hrs as needed anxiety or at bedtime insomnia.    magnesium oxide 400 mg magnesium tablet Take one tablet by mouth twice daily. (Patient taking differently:  Take one tablet by mouth twice daily as needed.)    ondansetron (ZOFRAN ODT) 8 mg rapid dissolve tablet Dissolve one tablet by mouth every 8 hours as needed for Nausea or Vomiting. Place on tongue to disolve.    potassium chloride (KLOR-CON SPRINKLE) 10 mEq capsule Take one capsule by mouth twice daily. If potassium < 3.5. Take with a meal and a full glass of water.    psyllium husk (METAMUCIL) 0.4 gram cap Take one capsule by mouth daily as needed.    sod chlor,sod bicarb/neti pot (SINUS WASH NETI POT SINI) Use as directed    sodium chloride 0.9% (NS) 0.9 % 1,000 mL with potassium chloride 2 mEq/mL 20 mEq, magnesium sulfate 4 mEq/mL (50 %) 2 g IV infusion Administer 1L NS with K and 2g Mg once weekly via outpatient infusion.    traMADoL (ULTRAM) 50 mg tablet Take one tablet to two tablets by mouth daily as needed for Pain.     Vitals:    02/28/22 0845   BP: 137/71   BP Source: Arm, Right Upper   Pulse: 81   Temp: 36.7 ?C (98.1 ?F)   Resp: 16   SpO2: 100%   TempSrc: Temporal   PainSc: Zero   Weight: 50.8 kg (112 lb)     Body mass index is 20.49 kg/m?Marland Kitchen     Pain Score: Zero       Pain Addressed:  N/A    Patient Evaluated for a Clinical Trial: Patient not eligible for a treatment trial (including not needing treatment, needs palliative care, in remission).     [place] Cooperative Oncology Group performance status is 1, Restricted in physically strenuous activity but ambulatory and able to carry out work of a light or sedentary nature, e.g., light house work, office work.       Physical Exam  Vitals reviewed.   Constitutional:       General: She is not in acute distress.     Appearance: Normal appearance. She is well-developed. She is not ill-appearing, toxic-appearing or diaphoretic.   HENT:      Head: Normocephalic and atraumatic.      Nose: Nose normal. No rhinorrhea.      Mouth/Throat:      Mouth: Mucous membranes are moist. Mucous membranes are not pale. No oral lesions.      Pharynx: Oropharynx is clear. No oropharyngeal exudate or posterior oropharyngeal erythema.      Tonsils: No tonsillar abscesses.   Eyes:      General: No scleral icterus.        Right eye: No discharge.         Left eye: No discharge.      Extraocular Movements: Extraocular movements intact.      Conjunctiva/sclera: Conjunctivae normal.      Pupils: Pupils are equal, round, and reactive to light.   Cardiovascular:      Rate and Rhythm: Normal rate and regular rhythm.      Pulses: Normal pulses.      Heart sounds: Normal heart sounds. No murmur heard.     No gallop.   Pulmonary:      Effort: Pulmonary effort is normal. No respiratory distress.      Breath sounds: No stridor. No wheezing, rhonchi or rales.   Chest:      Chest wall: No tenderness.      Comments: Port site WNL  Abdominal:      General: A surgical scar is present. Bowel sounds  are normal. There is no distension.      Palpations: There is no mass.      Tenderness: There is no abdominal tenderness. There is no guarding or rebound.      Hernia: No hernia is present.          Comments: Firmness in epigastric area.    Musculoskeletal:         General: No swelling, tenderness, deformity or signs of injury. Normal range of motion.      Cervical back: Normal range of motion and neck supple. No rigidity. No muscular tenderness.      Right lower leg: No edema.      Left lower leg: No edema.   Lymphadenopathy:      Cervical: No cervical adenopathy.   Skin:     General: Skin is warm and dry.      Coloration: Skin is not jaundiced or pale.      Findings: No bruising, erythema, lesion or rash.   Neurological:      General: No focal deficit present.      Mental Status: She is alert and oriented to person, place, and time. Mental status is at baseline.      Cranial Nerves: No cranial nerve deficit.      Motor: No weakness.      Coordination: Coordination normal.      Gait: Gait normal.   Psychiatric:         Mood and Affect: Mood normal.         Behavior: Behavior normal.         Thought Content: Thought content normal.         Judgment: Judgment normal.            CBC w/DIFF      Latest Ref Rng & Units 02/28/2022     7:38 AM 02/13/2022     9:42 AM 01/30/2022     9:36 AM 01/09/2022    10:47 AM 12/12/2021    10:41 AM   CBC with Diff   White Blood Cells 4.5 - 11.0 K/UL 4.1  6.8  7.4  15.5  5.9    RBC 4.0 - 5.0 M/UL 2.31  2.52  2.33  2.69  2.78    Hemoglobin 12.0 - 15.0 GM/DL 8.2  9.0  8.1  8.9  9.3    Hematocrit 36 - 45 % 24.5  26.7  24.5  27.2  28.1    MCV 80 - 100 FL 105.9  106.0  105.5  101.2  101.2    MCH 26 - 34 PG 35.4  35.7  34.8  33.2  33.4    MCHC 32.0 - 36.0 G/DL 16.1  09.6  04.5  40.9  33.0    RDW 11 - 15 % 17.2  17.7  17.7  16.4  15.4    Platelet Count 150 - 400 K/UL 350  388  366  452  367    MPV 7 - 11 FL 7.6  7.7  7.5  7.6  7.3    Neutrophils 41 - 77 % 20  44  36  68  34    Absolute Neutrophil Count 1.8 - 7.0 K/UL 0.80  3.10  2.70  10.30  2.00    Lymphocytes 24 - 44 % 45  35  36  19  45    Absolute Lymph Count 1.0 - 4.8 K/UL 1.90  2.40  2.70  3.00  2.70    Monocytes  4 - 12 % 30  16  22  12  18     Absolute Monocyte Count 0 - 0.80 K/UL 1.20  1.10  1.60  1.90  1.00    Eosinophils 0 - 5 % 4  4  5  1  3     Absolute Eosinophil Count 0 - 0.45 K/UL 0.20  0.30  0.30  0.20  0.20    Basophils 0 - 2 % 1  1  1   0  0    Absolute Basophil Count 0 - 0.20 K/UL 0.00  0.00  0.00  0.10  0.00      Comprehensive Metabolic Profile      Latest Ref Rng & Units 02/28/2022     7:38 AM 02/13/2022     9:42 AM 01/30/2022     9:36 AM 01/09/2022    10:47 AM 12/12/2021    10:41 AM   CMP   Sodium 137 - 147 MMOL/L 140  138  138  136  142    Potassium 3.5 - 5.1 MMOL/L 3.4  3.9  4.1  3.5  3.7    Chloride 98 - 110 MMOL/L 101  101  103  97  96    CO2 21 - 30 MMOL/L 30  28  26  30   37    Anion Gap 3 - 12 9  9  9  9  9     Blood Urea Nitrogen 7 - 25 MG/DL 19  22  21  25  23     Creatinine 0.4 - 1.00 MG/DL 5.64  3.32  9.51  8.84  2.80    Glucose 70 - 100 MG/DL 166  90  063  016  010    Calcium 8.5 - 10.6 MG/DL 8.4  9.2  9.0  8.5 9.1    Total Protein 6.0 - 8.0 G/DL 6.5  7.3  6.8  6.8  7.0    Albumin 3.5 - 5.0 G/DL 3.4  3.8  3.5  3.3  3.6    Alk Phosphatase 25 - 110 U/L 134  157  208  188  177    ALT (SGPT) 7 - 56 U/L 12  15  20  16  13     Total Bilirubin 0.3 - 1.2 MG/DL 0.6  0.6  0.5  0.7  0.7      Tumor Markers  Lab Results   Component Value Date    CEA 1,020.1 (H) 02/13/2022    CEA 911.3 (H) 01/30/2022    CEA 838.2 (H) 01/09/2022    CEA 511.8 (H) 12/12/2021    CEA 761.7 (H) 11/28/2021    CEA 681.0 (H) 11/10/2021    CEA 482.6 (H) 10/12/2021    CEA 451.0 (H) 09/28/2021    CEA 470.1 (H) 08/31/2021    CEA 456.4 (H) 08/17/2021    CA199 <1 02/13/2022    CA199 <1 01/30/2022    CA199 <1 01/09/2022    CA199 <1 12/12/2021    CA199 <1 11/28/2021    CA199 <1 11/10/2021    CA199 <1 09/28/2021    CA199 <1 08/31/2021    CA199 <1 08/17/2021    CA199 <1 08/03/2021    CA125 41 (H) 01/21/2018    CA125 42 (H) 11/21/2017    CA125 46 (H) 11/05/2017    CA125 41 (H) 10/22/2017    CA125 34 10/08/2017    CA125 34 09/24/2017    CA125 31 09/10/2017    CA125 29 08/27/2017  CA125 34 08/15/2017    CA125 39 (H) 07/30/2017           Imaging:  - 04/27/21 CT CAP without contrast  CHEST:   1.  No significant change in small indeterminate anterior epiphrenic lymph nodes. No new thoracic lymphadenopathy.   2. No significant change to further mild increase in size of pulmonary and pleural metastases.   3. Increasing nodular and tree-in-bud opacities in the lingula and development of mild focal consolidation in the left lower lobe, indeterminate and may be infectious/inflammatory nodules or potentially pulmonary metastases.       ABDOMEN AND PELVIS:   1.  Grossly unchanged extensive peritoneal carcinomatosis.   2. No significant change in small residual indeterminate retroperitoneal lymph nodes. No discrete new abdominopelvic lymphadenopathy.   3. Grossly unchanged right anterior abdominal wall metastases.     GI ENDO CATH BIL & PANC DUCT  This order has been auto finalized and does not contain a result.        ASSESSMENT AND PLAN:    1. Metastatic appendiceal carcinoma. Signet ring adenocarcinoma, MSS, with peritoneal carcinomatosis with  pseudomyxoma peritonei.  02/19/17 diagnostic laparoscopy with biopsies of the right diaphragm, right liver, right fallopian tube and left pelvis.  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  Planned for exploratory laparotomy with CRS/HIPEC in combination with a TAH/BSO in 03/2017.  Developed intestinal obstruction and taken to OR for CRS/HIPEC surgery; however multiple adhesions, omental cracking. CRS/HIPEC was aborted.  Immunotherapy on epacadostat + sirolimus trial 12/24/17-05/21/18.  She stopped study drug on 05/21/18 for mitomycin HIPEC trial at [facility] [place].  On 05/28/18 diagnostic laparoscopy, evacuation of mucinous ascites 1.5L, peritoneal biopsy, laparoscopic HIPEC with mitomycin-C in [place] by Dr. Daryl Eastern.  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  Completed second laparoscopic HIPEC on 06/25/18.  Again 1.5L removed.  09/12/18 underwent debulking at [facility] in [place], [place].  Removed 98% by Dr. [name].  Discharged on 09/30/18.  She had L pleural effusion requiring chest tube drainage; intra-abd abscess 10/2018; discharged on 11/04/18 with drain and IV cefepime and Diflucan. Her ureteral stents were removed and ostomy relocated in 10/2018.  Hospitalized in 01/2019 for AKI 2/2 dehydration and likely PE (V/Q scan and trop elevation). CEA increasing again. Repeat CT CAP 03/20/19 with stable to slight increase in cancer and RUL nodule.  Discussed observation vs treatment. She chose observation and is considering repeat CRS + HIPEC via Dr. [name] in [place].  CT scans from 09/24/19 of the abdomen, without contrast, appear stable.  The peritoneal thickening, carcinomatosis, retroperitoneal lymph nodes, a small amount of fluid collection or trace ascites, appear to be stable compared to 3 months ago. 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  CT scan on 08/17/20 showed a right lung mass that is slightly increased from 11 mm to 14 mm. There are some new lung nodules that have developed, a small one in the left lung, a new nodule in the right lung. Inside the abdomen we are seeing continued increase in size of the peritoneal nodules. The largest measuring 14 mm compared to 12 mm.  The nodule lower in the pelvis increased from 16 mm to 21 mm.  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  TMB = 1.3.  S/p CRS w/ ostomy revision and tumor removal around stoma on 10/14/20.  Post-op course complicated by high-output ileostomy, anemia 2/2 acute blood loss and chronic stage 3A CKD,  and leukocytosis. [name] PA from [facility] in [place] called to relay their TB recommendations for patient.  Recommendations to proceed with FOLFIRI/Avastin with holding Avastin 6-8 weeks post-op. They also request if our office can repeat genomic testing.  Repeat TEMPUS from 10/14/20.  Pre-screen for KRAS G12D inhibitor trial, if not eligible will plan for FOLFIRI +/- bevacizumab. Tolerated C1 FOLFIRI but more diarrhea and N/V after C2. Decreased irinotecan to 130 mg/m2 due to diarrhea. S/p C4, with bevacizumab added on. CT scan 02/16/21 abdominal wall spots are under control and stable. There is a slight increase in size of the lung nodules. The radiologist is measuring 10 mm to 8 mm in 11/2020. The other right lung nodule is more or less the same at 15 mm. CT scans with stable disease, mild increase in size of pulmonary metastases (specifically the right upper lobe nodule 2.2cm, previously 1.5cm). There is also some increasing nodular and tree-in-bud opacities in the lingula and development of mild focal consolidation in the left lower lobe, indeterminate but may be related to recent COVID infection vs pulmonary metastases. Given overall stability and down-trending CEA, we will plan to continue with the current treatment regimen for now. Dr. Dorita Fray with Radiation Oncology reviewed imaging (already received 50Gy to right lung 3/1-05/25/20) and will continue to monitor. CT scan on 08/03/2021 shows the cancer inside the abdomen and the abdominal wall metastasis is remaining the same compared to the scans done in 04/2021. The lung metastasis appears slightly larger by about 2 mm. Abnormal labs, even on repeat. Chemo held. Ultrasound reveals moderate ductal dilatation. Refer to GI for ERCP. Continued elevation of liver enzymes and bilirubin. MRI MRCP performed on 09/07/2021 shows extrinsic compression of the common bile duct causing biliary dilatation, as well as both intrahepatic ducts on the right and left side. CT scan 09/28/2021 shows the cancer was overall stable. However, the fact that it is causing biliary obstruction, ureter obstruction, and hydronephrosis tells [place] that clinically it is growing. There was mild hydronephrosis on both kidneys. cycle 18 of FOLFIRI,  stent replacement in 3 weeks, will hold bevacizumab. In October, re-discuss decreasing her Irinotecan to every 4 weeks if no worsening of labs and/or symptoms. CT scan on 12/12/2021 shows the peritoneal mass is stable. The dilation of the bile ducts has improved. The nodules in the lung are stable. There are new cluster of nodules which appear like inflammation.     Plan 02/28/22:  Reviewed labs today, WBC 4100 but ANC 800. Monocytes 1200  Repeat CBC in treatment today.   Proceed with C 23 of 5-FU pump, bevacizumab, IVF, magnesium, while waiting for labs. If still less than 1000, hold irinotecan this cycle.   Continue IVF at home as before.   RTC in 3 weeks with labs and visit for C24 on 03/21/22 (holidays), then CT scans and follow up with Dr. Josiah Lobo 04/27/22 (pt gone for vacation).   Pt to see Dr.Barlett (her surgeon in [place]) after scans   Her last tumor markers CEA- 1020, elevated. Will monitor results from today   Continue supportive measures. Pt to call with any questions, concerns or symptoms at any time and we can see her sooner.     2. Elevated creatinine, s/p ureteral stent. Her ureteral stents were removed 10/2018. Encouraged hydration. S/p IVF 1L NS and 1g Mg weekly x 4 weeks. Referred to Nephrology as Cr is improving with IVF. She is following with Dr. Marjie Skiff in Nephrology. Cr 2.48 after GI virus. Decreased  irinotecan to 130 mg/m2. Continue IV fluids 3 times a week locally, as we anticipate more diarrhea with irinotecan. Creatinine was greater than 4 with infection. Cr 2.09 today. Has seen urology for chronic infections. Is going on chronic antibiotics to control. Now on 250 cipro every 3rd day. Continue IVF closer to home. 2.44    3. Diarrhea. Better with Lomotil daily. Continue IV fluids 3 times a week locally. Increased her Lomotil to 2 tablets 4 times daily. She will continue to take Metamucil to bulk up her stools. She had a day of decreased stool output but was able to flush the Metamucil out with an enema. Bowels are back to baseline now. Continue to take Imodium as needed, okay to increase to 2 tabs at a time as needed. Better with advanced diet. Obstruction with too much fiber. Better with break in chemo. Monitor. IVF as needed throughout week. Improved. Continue Imodium and Lomotil as needed.     4.  Fatigue.  More with on-going chemo.     5. Hypomagnesemia. Due to large volume fluid loss. 1.1 today. Replace in treatment. She is on oral supplements at home Mag oxide as well     6. Urinary tract infection. Now managed by urology. On suppressive antibiotics cipro 250 mg every 3 days. Follow up with her urologist. Minimal increase in symptoms. Follow up with urology    7. Elevated LFTs. May be irinotecan, tylenol, cipro. Hold tylenol. Continue antibiotics. Improved after holding tylenol. Now elevated again with bilirubin 2.7, AST 66 and ALT 106. Some improvement after IVF but bilirubin still elevated at 2.3. ultrasound of abdomen to assess for biliary obstruction/dilation. This revealed moderate intrahepatic bile duct dilatation. 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 Refer to GI for possible ERCP. Stent placed. Now bili higher at 4.1 and LFTs higher. White count elevated. Admitted to hospital for repeat ERCP. They were able to extend stent, no PTC. Bilirubin now 1.4. ok to resume treatment. Repeat scope 9/7. WNL today. Replace beginning of January 2024     Turner Daniels, APRN-NP        The patient and family were allowed to ask questions and voice concerns; these were addressed to the best of our ability. They expressed understanding of what was explained to them, and they agreed with the present plan. RTC in 4 weeks with labs to see a provider. [facility] and was instructed on how to contact us with any questions or concerns. My collaborating physician on this case is Dr. Josiah Lobo.

## 2022-02-28 ENCOUNTER — Encounter: Admit: 2022-02-28 | Discharge: 2022-02-28 | Payer: MEDICARE

## 2022-02-28 DIAGNOSIS — C786 Secondary malignant neoplasm of retroperitoneum and peritoneum: Secondary | ICD-10-CM

## 2022-02-28 LAB — CBC
HEMATOCRIT: 22 % — ABNORMAL LOW (ref 36–45)
HEMOGLOBIN: 8 g/dL — ABNORMAL LOW (ref 12.0–15.0)
MCH: 36 pg — ABNORMAL HIGH (ref 26–34)
MCHC: 34 g/dL (ref 32.0–36.0)
MCV: 104 FL — ABNORMAL HIGH (ref 80–100)
PLATELET COUNT: 381 K/UL (ref 150–400)
RBC COUNT: 2.1 M/UL — ABNORMAL LOW (ref 4.0–5.0)
RDW: 17 % — ABNORMAL HIGH (ref 11–15)
WBC COUNT: 5 K/UL (ref 4.5–11.0)

## 2022-02-28 LAB — CBC AND DIFF
ABSOLUTE BASO COUNT: 0 K/UL (ref 0–0.20)
ABSOLUTE EOS COUNT: 0.2 K/UL (ref 0–0.45)
ABSOLUTE LYMPH COUNT: 1.9 K/UL (ref 1.0–4.8)
ABSOLUTE MONO COUNT: 1.2 K/UL — ABNORMAL HIGH (ref 0–0.80)
ABSOLUTE NEUTROPHIL: 0.8 K/UL — ABNORMAL LOW (ref 1.8–7.0)
BASOPHILS %: 1 % — ABNORMAL LOW (ref >60)
EOSINOPHILS %: 4 % (ref 0–5)
LYMPHOCYTES %: 45 % — ABNORMAL HIGH (ref 24–44)
MCH: 35 pg — ABNORMAL HIGH (ref 26–34)
MCHC: 33 g/dL (ref 32.0–36.0)
MCV: 105 FL — ABNORMAL HIGH (ref 80–100)
MONOCYTES %: 30 % — ABNORMAL HIGH (ref 4–12)
MPV: 7.6 FL — ABNORMAL HIGH (ref 7–11)
NEUTROPHILS %: 20 % — ABNORMAL LOW (ref 41–77)
PLATELET COUNT: 350 K/UL — ABNORMAL LOW (ref 150–400)
RDW: 17 % — ABNORMAL HIGH (ref 11–15)
WBC COUNT: 4.1 K/UL — ABNORMAL LOW (ref 4.5–11.0)

## 2022-02-28 LAB — IRON + BINDING CAPACITY + %SAT+ FERRITIN
% SATURATION: 17 % — ABNORMAL LOW (ref 28–42)
FERRITIN: 785 ng/mL — ABNORMAL HIGH (ref 10–200)
IRON BINDING: 399 ug/dL — ABNORMAL HIGH (ref 270–380)
IRON: 66 ug/dL (ref 50–160)

## 2022-02-28 LAB — MANUAL DIFF
EOSINOPHIL %: 4 % (ref 0–5)
LYMPHOCYTES: 55 % — ABNORMAL HIGH (ref 24–44)
MONOCYTES %: 24 % — ABNORMAL HIGH (ref 4–12)
NEUTROPHILS,SEG: 17 % — ABNORMAL LOW (ref 41–77)

## 2022-02-28 LAB — CEA(CARCINOEMBRYONIC AG): CEA: 838 ng/mL — ABNORMAL HIGH (ref <3.0)

## 2022-02-28 LAB — CA19.9: CA 19-9: <1 U/mL — ABNORMAL LOW (ref <35)

## 2022-02-28 LAB — COMPREHENSIVE METABOLIC PANEL: SODIUM: 140 MMOL/L (ref 137–147)

## 2022-02-28 LAB — MAGNESIUM: MAGNESIUM: 1.1 mg/dL — ABNORMAL LOW (ref 1.6–2.6)

## 2022-02-28 MED ORDER — BEVACIZUMAB-AWWB IVPB
5 mg/kg | Freq: Once | INTRAVENOUS | 0 refills | Status: CP
Start: 2022-02-28 — End: ?
  Administered 2022-02-28 (×2): 260.5 mg via INTRAVENOUS

## 2022-02-28 MED ORDER — ATROPINE 0.4 MG/ML IJ SOLN
.4 mg | INTRAVENOUS | 0 refills | Status: AC | PRN
Start: 2022-02-28 — End: ?

## 2022-02-28 MED ORDER — FLUOROURACIL IV AMB PUMP
2400 mg/m2 | Freq: Once | INTRAVENOUS | 0 refills | Status: CP
Start: 2022-02-28 — End: ?
  Administered 2022-02-28 (×4): 3624 mg via INTRAVENOUS

## 2022-02-28 MED ORDER — DEXAMETHASONE 4 MG PO TAB
4 mg | Freq: Once | ORAL | 0 refills | Status: CP
Start: 2022-02-28 — End: ?
  Administered 2022-02-28: 17:00:00 4 mg via ORAL

## 2022-02-28 MED ORDER — PALONOSETRON 0.25 MG/5 ML IV SOLN
.25 mg | Freq: Once | INTRAVENOUS | 0 refills | Status: CP
Start: 2022-02-28 — End: ?
  Administered 2022-02-28: 17:00:00 0.25 mg via INTRAVENOUS

## 2022-02-28 MED ORDER — SODIUM CHLORIDE 0.9 % IV SOLP
1000 mL | Freq: Once | INTRAVENOUS | 0 refills | Status: CP
Start: 2022-02-28 — End: ?
  Administered 2022-02-28: 16:00:00 1000 mL via INTRAVENOUS

## 2022-02-28 MED ORDER — MAGNESIUM SULFATE IN D5W 1 GRAM/100 ML IV PGBK (CC ONLY)
1 g | INTRAVENOUS | 0 refills | Status: CP
Start: 2022-02-28 — End: ?
  Administered 2022-02-28 (×2): 1 g via INTRAVENOUS

## 2022-03-02 ENCOUNTER — Encounter: Admit: 2022-03-02 | Discharge: 2022-03-02 | Payer: MEDICARE

## 2022-03-09 ENCOUNTER — Encounter: Admit: 2022-03-09 | Discharge: 2022-03-09 | Payer: MEDICARE

## 2022-03-09 DIAGNOSIS — I454 Nonspecific intraventricular block: Secondary | ICD-10-CM

## 2022-03-09 DIAGNOSIS — C801 Malignant (primary) neoplasm, unspecified: Secondary | ICD-10-CM

## 2022-03-09 DIAGNOSIS — Z4659 Encounter for fitting and adjustment of other gastrointestinal appliance and device: Secondary | ICD-10-CM

## 2022-03-09 DIAGNOSIS — K839 Disease of biliary tract, unspecified: Secondary | ICD-10-CM

## 2022-03-09 DIAGNOSIS — C181 Malignant neoplasm of appendix: Secondary | ICD-10-CM

## 2022-03-09 DIAGNOSIS — K869 Disease of pancreas, unspecified: Secondary | ICD-10-CM

## 2022-03-09 DIAGNOSIS — C189 Malignant neoplasm of colon, unspecified: Secondary | ICD-10-CM

## 2022-03-16 ENCOUNTER — Encounter: Admit: 2022-03-16 | Discharge: 2022-03-16 | Payer: MEDICARE

## 2022-03-18 NOTE — Progress Notes
Date of Service: 03/21/2022    SUBJECTIVE:             Reason for Visit:  Follow Up    Vicki Buchanan is a 66 y.o. 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  02/19/17 diagnostic laparoscopy with biopsies of the right diaphragm, right liver, right fallopian tube and left pelvis.  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  Planned for exploratory laparotomy with CRS/HIPEC in combination with a TAH/BSO in 03/2017.  Developed intestinal obstruction and taken to OR for CRS/HIPEC surgery; however multiple adhesions, omental cracking. CRS/HIPEC was aborted.  Immunotherapy on epacadostat + sirolimus trial 12/24/17-05/21/18.  She stopped study drug on 05/21/18 for mitomycin HIPEC trial at [facility] [place].  On 05/28/18 diagnostic laparoscopy, evacuation of mucinous ascites 1.5L, peritoneal biopsy, laparoscopic HIPEC with mitomycin-C in [place] by Dr. Daryl Eastern.  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  Completed second laparoscopic HIPEC on 06/25/18.  Again 1.5L removed.  09/12/18 underwent debulking at [facility] in [place], [place].  Removed 98% by Dr. [name].  Discharged on 09/30/18.  She had L pleural effusion requiring chest tube drainage; intra-abd abscess 10/2018; discharged on 11/04/18 with drain and IV cefepime and Diflucan. Her ureteral stents were removed and ostomy relocated in 10/2018.  Hospitalized in 01/2019 for AKI 2/2 dehydration and likely PE (V/Q scan and trop elevation). CEA increasing again. Repeat CT CAP 03/20/19 with stable to slight increase in cancer and RUL nodule.  Discussed observation vs treatment. She chose observation and is considering repeat CRS + HIPEC via Dr. [name] in [place].  CT scans from 09/24/19 of the abdomen, without contrast, appear stable.  The peritoneal thickening, carcinomatosis, retroperitoneal lymph nodes, a small amount of fluid collection or trace ascites, appear to be stable compared to 3 months ago. 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  CT scan on 08/17/20 showed a right lung mass that is slightly increased from 11 mm to 14 mm. There are some new lung nodules that have developed, a small one in the left lung, a new nodule in the right lung. Inside the abdomen we are seeing continued increase in size of the peritoneal nodules. The largest measuring 14 mm compared to 12 mm.  The nodule lower in the pelvis increased from 16 mm to 21 mm.  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  TMB = 1.3.  S/p CRS w/ ostomy revision and tumor removal around stoma on 10/14/20.  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 [name] PA from [facility] in [place] called to relay their TB recommendations for patient.  Recommendations to proceed with FOLFIRI/Avastin with holding Avastin 6-8 weeks post-op. They also request if our office can repeat genomic testing.  Repeat TEMPUS from 10/14/20.  Pre-screen for KRAS G12D inhibitor trial, if not eligible will plan for FOLFIRI +/- bevacizumab.        Interval Hx 03/21/22:  Patient is here for a follow up visit. She feels better after the break in chemo. Her energy is better and her labs improved. She is eating fairly well and has gained a little weight. Her only complaint today is pain around her stoma. The skin breakdown is healing but she still has a burning pain on the right side of the stoma. Most days she feels really  good except for that discomfort.        Review of Systems   Constitutional:  Positive for fatigue. Negative for activity change, appetite change, chills, diaphoresis, fever and unexpected weight change.   HENT:  Negative for facial swelling, mouth sores, sore throat, trouble swallowing and voice change.    Eyes: Negative.  Negative for visual disturbance.   Respiratory:  Negative for cough, chest tightness, shortness of breath and wheezing.    Cardiovascular: Negative.  Negative for chest pain, palpitations and leg swelling.   Gastrointestinal:  Positive for diarrhea. Negative for abdominal distention, abdominal pain, anal bleeding, blood in stool, constipation, nausea, rectal pain and vomiting.   Genitourinary:  Negative for difficulty urinating, dysuria and hematuria.   Musculoskeletal: Negative.  Negative for arthralgias, back pain, neck pain and neck stiffness.   Skin:  Positive for wound (skin breakdown around stoma). Negative for color change, pallor and rash.   Neurological:  Positive for weakness and numbness. Negative for dizziness, light-headedness and headaches.   Hematological:  Does not bruise/bleed easily.   Psychiatric/Behavioral:  Negative for agitation. The patient is not nervous/anxious.        Medical History:   Diagnosis Date    Acid reflux     Bundle branch block     Cancer (HCC)     Cancer of appendix (HCC)     Cancer of colon (HCC) 11/19     Surgical History:   Procedure Laterality Date    CESAREAN SECTION  1981    HX CHOLECYSTECTOMY  1995    LAPAROSCOPY  02/2017    biopsy peritoneal    COLONOSCOPY DIAGNOSTIC WITH SPECIMEN COLLECTION BY BRUSHING/ WASHING - FLEXIBLE N/A 03/19/2017    Performed by Everardo All, MD at [facility] ENDO    COLONOSCOPY WITH BIOPSY - FLEXIBLE  03/19/2017    Performed by Everardo All, MD at [facility] ENDO    EXPLORATORY LAPAROTOMY, DIVERTING LOOP ILEOSTOMY N/A 04/03/2017    Performed by Loralie Champagne, MD at [facility] OR    INSERTION TUNNELED CENTRAL VENOUS CATHETER - AGE 23 YEARS AND OVER Left 04/23/2017    Performed by Loralie Champagne, MD at [facility] OR    CYSTOURETHROSCOPY WITH INDWELLING URETERAL STENT INSERTION Bilateral 04/09/2018    Performed by Earl Many, MD at [facility] OR    CYSTOURETHROSCOPY WITH URETERAL CATHETERIZATION WITH/ WITHOUT IRRIGATION/ INSTILLATION/ URETEROPYELOGRAPHY Bilateral 04/09/2018    Performed by Earl Many, MD at [facility] OR    CYSTOURETHROSCOPY WITH INDWELLING URETERAL STENT EXCHANGE (RIGHT 6 x 26cm / LEFT 6 x 28cm) Bilateral 08/08/2018    Performed by Earl Many, MD at [facility] OR    RETROGRADE UROGRAPHY WITH/ WITHOUT KUB Bilateral 08/08/2018    Performed by Earl Many, MD at [facility] OR    ENDOSCOPIC RETROGRADE CHOLANGIOPANCREATOGRAPHY WITH SPHINCTEROTOMY/ PAPILLOTOMY N/A 09/16/2021    Performed by Vertell Novak, MD at [facility] ENDO    ESOPHAGOGASTRODUODENOSCOPY WITH ENDOSCOPIC ULTRASOUND EXAMINATION - FLEXIBLE  09/16/2021    Performed by Vertell Novak, MD at [facility] ENDO    ENDOSCOPIC RETROGRADE CHOLANGIOPANCREATOGRAPHY WITH PLACEMENT ENDOSCOPIC STENT INTO BILIARY/ PANCREATIC DUCT - EACH STENT  09/16/2021    Performed by Vertell Novak, MD at [facility] ENDO    COLONOSCOPY DIAGNOSTIC WITH SPECIMEN COLLECTION BY BRUSHING/ WASHING - FLEXIBLE N/A 09/26/2021    Performed by Vertell Novak, MD at [facility] OR    ENDOSCOPIC RETROGRADE CHOLANGIOPANCREATOGRAPHY WITH PLACEMENT ENDOSCOPIC STENT INTO BILIARY/ PANCREATIC DUCT - EACH STENT  09/29/2021    Performed by Wynonia Lawman,  Ignacia Felling, MD at [facility] ENDO    ENDOSCOPIC RETROGRADE CHOLANGIOPANCREATOGRAPHY with plastic stent exchange N/A 10/27/2021    Performed by Eliott Nine, MD at [facility] ENDO    ENDOSCOPIC RETROGRADE CHOLANGIOPANCREATOGRAPHY WITH ABLATION TUMOR/ POLYP/ OTHER LESION  10/27/2021    Performed by Eliott Nine, MD at [facility] ENDO    ENDOSCOPIC RETROGRADE CHOLANGIOPANCREATOGRAPHY WITH REMOVAL CALCULI/ DEBRIS FROM BILIARY/ PANCREATIC DUCT  10/27/2021    Performed by Eliott Nine, MD at [facility] ENDO    ENDOSCOPIC RETROGRADE CHOLANGIOPANCREATOGRAPHY  plastic stent replacement N/A 12/22/2021    Performed by Eliott Nine, MD at [facility] ENDO    COLON SURGERY  7/20    COLON SURGERY  7/20    HX APPENDECTOMY  1/10    Guess on date    HX CESAREAN SECTION      HX HYSTERECTOMY  7/20    HX LOWER ANTERIOR RESECTION OF COLON      7/20    HX SALPINGO-OOPHORECTOMY      ILEOSTOMY OR JEJUNOSTOMY Right     LIVER SURGERY  12/24/21    PORTACATH PLACEMENT  1/20    PR PNCRTECT PROX STOT W/PANCREATOJEJUNOSTOMY  7/20    TUNNELED VENOUS PORT PLACEMENT Left     URETER STENT PLACEMENT Bilateral      Family History   Problem Relation Age of Onset    Diabetes Mother     Cancer Father     Diabetes Brother     Cancer-Lung Paternal Aunt     Cancer-Breast Maternal Grandmother     Diabetes Paternal Grandmother      Social History     Socioeconomic History    Marital status: Married   Tobacco Use    Smoking status: Never    Smokeless tobacco: Never   Vaping Use    Vaping Use: Never used   Substance and Sexual Activity    Alcohol use: No     Comment: rarely    Drug use: No         OBJECTIVE:          calcium carbonate (TUMS) 500 mg (200 mg elemental calcium) chewable tablet Chew one tablet by mouth every 6 hours as needed.    carvediloL (COREG) 6.25 mg tablet Take one tablet by mouth twice daily. Take with food.  Hold if systolic blood pressure < 110    Cetirizine (ZYRTEC) 10 mg cap Take one capsule by mouth as Needed.    ciprofloxacin (CIPRO) 250 mg tablet Take one tablet by mouth every 48 hours.    dexAMETHasone (DECADRON) 4 mg tablet 1 tab by mouth with breakfast and afternoon snack on days 2 & 3 following each chemotherapy cycle. Do not take after 6 pm to avoid insomnia.    diphenoxylate-atropine (LOMOTIL) 2.5-0.025 mg tablet Take one tablet by mouth four times daily as needed for Diarrhea.    famotidine (PEPCID AC) 10 mg tablet Take one tablet by mouth twice daily.    LIDO/ANTACID/APAP 1:3:1 SUSP (MIXTURE COMPOUND) Take 30 mL (1 cup) by mouth every 4 hours as needed.    loperamide (IMODIUM A-D) 2 mg capsule Take one pill by mouth four times daily as needed    LORazepam (ATIVAN) 0.5 mg tablet Take 1-2 tabs by mouth every 6 hrs as needed N/V not controlled by Zofran or Compazine. May also use every 6 hrs as needed anxiety or at bedtime insomnia.    magnesium oxide 400 mg magnesium tablet Take one tablet by mouth twice daily. (Patient taking differently:  Take one tablet by mouth twice daily as needed.) ondansetron (ZOFRAN ODT) 8 mg rapid dissolve tablet Dissolve one tablet by mouth every 8 hours as needed for Nausea or Vomiting. Place on tongue to disolve.    potassium chloride (KLOR-CON SPRINKLE) 10 mEq capsule Take one capsule by mouth twice daily. If potassium < 3.5. Take with a meal and a full glass of water.    psyllium husk (METAMUCIL) 0.4 gram cap Take one capsule by mouth daily as needed.    sod chlor,sod bicarb/neti pot (SINUS WASH NETI POT SINI) Use as directed    sodium chloride 0.9% (NS) 0.9 % 1,000 mL with potassium chloride 2 mEq/mL 20 mEq, magnesium sulfate 4 mEq/mL (50 %) 2 g IV infusion Administer 1L NS with K and 2g Mg once weekly via outpatient infusion.    traMADoL (ULTRAM) 50 mg tablet Take one tablet to two tablets by mouth daily as needed for Pain.     Vitals:    03/21/22 0822   BP: 115/78   BP Source: Arm, Right Upper   Pulse: 80   Temp: 36.1 ?C (97 ?F)   Resp: 16   SpO2: 100%   TempSrc: Temporal   PainSc: Zero   Weight: 51.1 kg (112 lb 9.6 oz)     Body mass index is 20.59 kg/m?Marland Kitchen     Pain Score: Zero       Pain Addressed:  N/A and Pain Management Referral ordered    Patient Evaluated for a Clinical Trial: Patient not eligible for a treatment trial (including not needing treatment, needs palliative care, in remission).     [place] Cooperative Oncology Group performance status is 1, Restricted in physically strenuous activity but ambulatory and able to carry out work of a light or sedentary nature, e.g., light house work, office work.       Physical Exam  Vitals reviewed.   Constitutional:       General: She is not in acute distress.     Appearance: Normal appearance. She is well-developed. She is not ill-appearing, toxic-appearing or diaphoretic.      Comments: Hair thinning.    HENT:      Head: Normocephalic and atraumatic.      Nose: Nose normal. No rhinorrhea.      Mouth/Throat:      Mouth: Mucous membranes are moist. Mucous membranes are not pale. No oral lesions.      Pharynx: Oropharynx is clear. No oropharyngeal exudate or posterior oropharyngeal erythema.      Tonsils: No tonsillar abscesses.   Eyes:      General: No scleral icterus.        Right eye: No discharge.         Left eye: No discharge.      Extraocular Movements: Extraocular movements intact.      Conjunctiva/sclera: Conjunctivae normal.      Pupils: Pupils are equal, round, and reactive to light.   Cardiovascular:      Rate and Rhythm: Normal rate and regular rhythm.      Pulses: Normal pulses.      Heart sounds: Normal heart sounds. No murmur heard.     No gallop.   Pulmonary:      Effort: Pulmonary effort is normal. No respiratory distress.      Breath sounds: No stridor. No wheezing, rhonchi or rales.   Chest:      Chest wall: No tenderness.      Comments: Port site WNL  Abdominal:  General: A surgical scar is present. The ostomy site is clean. Bowel sounds are normal. There is no distension.      Palpations: There is no mass.      Tenderness: There is no abdominal tenderness. There is no guarding or rebound.      Hernia: No hernia is present.          Comments: Firmness in epigastric area.    Musculoskeletal:         General: No swelling, tenderness, deformity or signs of injury. Normal range of motion.      Cervical back: Normal range of motion and neck supple. No rigidity. No muscular tenderness.      Right lower leg: No edema.      Left lower leg: No edema.   Lymphadenopathy:      Cervical: No cervical adenopathy.   Skin:     General: Skin is warm and dry.      Coloration: Skin is not jaundiced or pale.      Findings: No bruising, erythema, lesion or rash.   Neurological:      General: No focal deficit present.      Mental Status: She is alert and oriented to person, place, and time. Mental status is at baseline.      Cranial Nerves: No cranial nerve deficit.      Motor: No weakness.      Coordination: Coordination normal.      Gait: Gait normal.   Psychiatric:         Mood and Affect: Mood normal. Behavior: Behavior normal.         Thought Content: Thought content normal.         Judgment: Judgment normal.            CBC w/DIFF      Latest Ref Rng & Units 03/21/2022     7:36 AM 02/28/2022    10:00 AM 02/28/2022     7:38 AM 02/13/2022     9:42 AM 01/30/2022     9:36 AM   CBC with Diff   White Blood Cells 4.5 - 11.0 K/UL 11.0  5.0  4.1  6.8  7.4    RBC 4.0 - 5.0 M/UL 2.51  2.18  2.31  2.52  2.33    Hemoglobin 12.0 - 15.0 GM/DL 9.1  8.0  8.2  9.0  8.1    Hematocrit 36 - 45 % 26.7  22.9  24.5  26.7  24.5    MCV 80 - 100 FL 106.2  104.9  105.9  106.0  105.5    MCH 26 - 34 PG 36.3  36.5  35.4  35.7  34.8    MCHC 32.0 - 36.0 G/DL 16.1  09.6  04.5  40.9  33.0    RDW 11 - 15 % 16.1  17.3  17.2  17.7  17.7    Platelet Count 150 - 400 K/UL 452  381  350  388  366    MPV 7 - 11 FL 7.8  7.4  7.6  7.7  7.5    Neutrophils 41 - 77 % 51   20  44  36    Absolute Neutrophil Count 1.8 - 7.0 K/UL 5.40   0.80  3.10  2.70    Lymphocytes 24 - 44 % 23   45  35  36    Absolute Lymph Count 1.0 - 4.8 K/UL 2.60   1.90  2.40  2.70  Monocytes 4 - 12 % 18   30  16  22     Absolute Monocyte Count 0 - 0.80 K/UL 2.00   1.20  1.10  1.60    Eosinophils 0 - 5 % 8   4  4  5     Absolute Eosinophil Count 0 - 0.45 K/UL 0.90   0.20  0.30  0.30    Basophils 0 - 2 % 0   1  1  1     Absolute Basophil Count 0 - 0.20 K/UL 0.00   0.00  0.00  0.00      Comprehensive Metabolic Profile      Latest Ref Rng & Units 03/21/2022     7:36 AM 02/28/2022     7:38 AM 02/13/2022     9:42 AM 01/30/2022     9:36 AM 01/09/2022    10:47 AM   CMP   Sodium 137 - 147 MMOL/L 138  140  138  138  136    Potassium 3.5 - 5.1 MMOL/L 3.4  3.4  3.9  4.1  3.5    Chloride 98 - 110 MMOL/L 98  101  101  103  97    CO2 21 - 30 MMOL/L 30  30  28  26  30     Anion Gap 3 - 12 10  9  9  9  9     Blood Urea Nitrogen 7 - 25 MG/DL 29  19  22  21  25     Creatinine 0.4 - 1.00 MG/DL 8.65  7.84  6.96  2.95  2.55    Glucose 70 - 100 MG/DL 284  132  90  440  102    Calcium 8.5 - 10.6 MG/DL 9.2  8.4  9.2  9.0 8.5    Total Protein 6.0 - 8.0 G/DL 7.4  6.5  7.3  6.8  6.8    Albumin 3.5 - 5.0 G/DL 3.7  3.4  3.8  3.5  3.3    Alk Phosphatase 25 - 110 U/L 149  134  157  208  188    ALT (SGPT) 7 - 56 U/L 10  12  15  20  16     Total Bilirubin 0.3 - 1.2 MG/DL 0.6  0.6  0.6  0.5  0.7      Tumor Markers  Lab Results   Component Value Date    CEA 838.9 (H) 02/28/2022    CEA 1,020.1 (H) 02/13/2022    CEA 911.3 (H) 01/30/2022    CEA 838.2 (H) 01/09/2022    CEA 511.8 (H) 12/12/2021    CEA 761.7 (H) 11/28/2021    CEA 681.0 (H) 11/10/2021    CEA 482.6 (H) 10/12/2021    CEA 451.0 (H) 09/28/2021    CEA 470.1 (H) 08/31/2021    CA199 <1 02/28/2022    CA199 <1 02/13/2022    CA199 <1 01/30/2022    CA199 <1 01/09/2022    CA199 <1 12/12/2021    CA199 <1 11/28/2021    CA199 <1 11/10/2021    CA199 <1 09/28/2021    CA199 <1 08/31/2021    CA199 <1 08/17/2021    CA125 41 (H) 01/21/2018    CA125 42 (H) 11/21/2017    CA125 46 (H) 11/05/2017    CA125 41 (H) 10/22/2017    CA125 34 10/08/2017    CA125 34 09/24/2017    CA125 31 09/10/2017    CA125 29 08/27/2017    CA125 34 08/15/2017  CA125 39 (H) 07/30/2017           Imaging:  - 04/27/21 CT CAP without contrast  CHEST:   1.  No significant change in small indeterminate anterior epiphrenic lymph nodes. No new thoracic lymphadenopathy.   2. No significant change to further mild increase in size of pulmonary and pleural metastases.   3. Increasing nodular and tree-in-bud opacities in the lingula and development of mild focal consolidation in the left lower lobe, indeterminate and may be infectious/inflammatory nodules or potentially pulmonary metastases.       ABDOMEN AND PELVIS:   1.  Grossly unchanged extensive peritoneal carcinomatosis.   2. No significant change in small residual indeterminate retroperitoneal lymph nodes. No discrete new abdominopelvic lymphadenopathy.   3. Grossly unchanged right anterior abdominal wall metastases.     GI ENDO CATH BIL & PANC DUCT  This order has been auto finalized and does not contain a result.        ASSESSMENT AND PLAN:    1. Metastatic appendiceal carcinoma. Signet ring adenocarcinoma, MSS, with peritoneal carcinomatosis with  pseudomyxoma peritonei.  02/19/17 diagnostic laparoscopy with biopsies of the right diaphragm, right liver, right fallopian tube and left pelvis.  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  Planned for exploratory laparotomy with CRS/HIPEC in combination with a TAH/BSO in 03/2017.  Developed intestinal obstruction and taken to OR for CRS/HIPEC surgery; however multiple adhesions, omental cracking. CRS/HIPEC was aborted.  Immunotherapy on epacadostat + sirolimus trial 12/24/17-05/21/18.  She stopped study drug on 05/21/18 for mitomycin HIPEC trial at [facility] [place].  On 05/28/18 diagnostic laparoscopy, evacuation of mucinous ascites 1.5L, peritoneal biopsy, laparoscopic HIPEC with mitomycin-C in [place] by Dr. Daryl Eastern.  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  Completed second laparoscopic HIPEC on 06/25/18.  Again 1.5L removed.  09/12/18 underwent debulking at [facility] in [place], [place].  Removed 98% by Dr. [name].  Discharged on 09/30/18.  She had L pleural effusion requiring chest tube drainage; intra-abd abscess 10/2018; discharged on 11/04/18 with drain and IV cefepime and Diflucan. Her ureteral stents were removed and ostomy relocated in 10/2018.  Hospitalized in 01/2019 for AKI 2/2 dehydration and likely PE (V/Q scan and trop elevation). CEA increasing again. Repeat CT CAP 03/20/19 with stable to slight increase in cancer and RUL nodule.  Discussed observation vs treatment. She chose observation and is considering repeat CRS + HIPEC via Dr. [name] in [place].  CT scans from 09/24/19 of the abdomen, without contrast, appear stable.  The peritoneal thickening, carcinomatosis, retroperitoneal lymph nodes, a small amount of fluid collection or trace ascites, appear to be stable compared to 3 months ago. 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  CT scan on 08/17/20 showed a right lung mass that is slightly increased from 11 mm to 14 mm. There are some new lung nodules that have developed, a small one in the left lung, a new nodule in the right lung. Inside the abdomen we are seeing continued increase in size of the peritoneal nodules. The largest measuring 14 mm compared to 12 mm.  The nodule lower in the pelvis increased from 16 mm to 21 mm.  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  TMB = 1.3.  S/p CRS w/ ostomy revision and tumor removal around stoma on 10/14/20.  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 [name] PA from [facility] [place] called to relay their TB recommendations for patient.  Recommendations to proceed with FOLFIRI/Avastin with holding Avastin 6-8 weeks post-op. They also request if our office can repeat genomic testing.  Repeat TEMPUS from 10/14/20.  Pre-screen for KRAS G12D inhibitor trial, if not eligible will plan for FOLFIRI +/- bevacizumab. Tolerated C1 FOLFIRI but more diarrhea and N/V after C2. Decreased irinotecan to 130 mg/m2 due to diarrhea. S/p C4, with bevacizumab added on. CT scan 02/16/21 abdominal wall spots are under control and stable. There is a slight increase in size of the lung nodules. The radiologist is measuring 10 mm to 8 mm in 11/2020. The other right lung nodule is more or less the same at 15 mm. CT scans with stable disease, mild increase in size of pulmonary metastases (specifically the right upper lobe nodule 2.2cm, previously 1.5cm). There is also some increasing nodular and tree-in-bud opacities in the lingula and development of mild focal consolidation in the left lower lobe, indeterminate but may be related to recent COVID infection vs pulmonary metastases. Given overall stability and down-trending CEA, we will plan to continue with the current treatment regimen for now. Dr. Dorita Fray with Radiation Oncology reviewed imaging (already received 50Gy to right lung 3/1-05/25/20) and will continue to monitor. CT scan on 08/03/2021 shows the cancer inside the abdomen and the abdominal wall metastasis is remaining the same compared to the scans done in 04/2021. The lung metastasis appears slightly larger by about 2 mm. Abnormal labs, even on repeat. Chemo held. Ultrasound reveals moderate ductal dilatation. Refer to GI for ERCP. Continued elevation of liver enzymes and bilirubin. MRI MRCP performed on 09/07/2021 shows extrinsic compression of the common bile duct causing biliary dilatation, as well as both intrahepatic ducts on the right and left side. CT scan 09/28/2021 shows the cancer was overall stable. However, the fact that it is causing biliary obstruction, ureter obstruction, and hydronephrosis tells [place] that clinically it is growing. There was mild hydronephrosis on both kidneys. cycle 18 of FOLFIRI,  stent replacement in 3 weeks, will hold bevacizumab. In October, re-discuss decreasing her Irinotecan to every 4 weeks if no worsening of labs and/or symptoms. CT scan on 12/12/2021 shows the peritoneal mass is stable. The dilation of the bile ducts has improved. The nodules in the lung are stable. There are new cluster of nodules which appear like inflammation.     Plan 03/21/22:  Reviewed labs today, WBC 11,000, ANC 5400  Proceed with C 24 of 5-FU pump, irinotecan, IVF, magnesium, but hold bevacizumab (stent replacement next week)   Continue IVF at home as before.   CT scans after this cycle and follow up with Dr. Josiah Lobo 04/27/22 (pt gone for vacation).   Pt to see Dr.Barlett (her surgeon in [place]) after scans   Her last tumor markers CEA- 1020, elevated. But now down to 838.9   Continue supportive measures. Pt to call with any questions, concerns or symptoms at any time and we can see her sooner.     2. Elevated creatinine, s/p ureteral stent. Her ureteral stents were removed 10/2018. Encouraged hydration. S/p IVF 1L NS and 1g Mg weekly x 4 weeks. Referred to Nephrology as Cr is improving with IVF. She is following with Dr. Marjie Skiff in Nephrology. Cr 2.48 after GI virus. Decreased irinotecan to 130 mg/m2. Continue IV fluids 3 times a week locally, as we anticipate more diarrhea with irinotecan. Creatinine was greater than 4 with infection. Cr 2.09 today. Has seen urology for  chronic infections. Is going on chronic antibiotics to control. Now on 250 cipro every 3rd day. Continue IVF closer to home. 2.33    3. Diarrhea. Better with Lomotil daily. Continue IV fluids 3 times a week locally. Increased her Lomotil to 2 tablets 4 times daily. She will continue to take Metamucil to bulk up her stools. She had a day of decreased stool output but was able to flush the Metamucil out with an enema. Bowels are back to baseline now. Continue to take Imodium as needed, okay to increase to 2 tabs at a time as needed. Better with advanced diet. Obstruction with too much fiber. Better with break in chemo. Monitor. IVF as needed throughout week. Improved. Continue Imodium and Lomotil as needed.     4.  Fatigue.  More with on-going chemo.     5. Hypomagnesemia. Due to large volume fluid loss. 1.3 today. Replace in treatment. She is on oral supplements at home Mag oxide as well     6. Urinary tract infection. Now managed by urology. On suppressive antibiotics cipro 250 mg every 3 days. Follow up with her urologist. Minimal increase in symptoms. Follow up with urology    7. Elevated LFTs. May be irinotecan, tylenol, cipro. Hold tylenol. Continue antibiotics. Improved after holding tylenol. Now elevated again with bilirubin 2.7, AST 66 and ALT 106. Some improvement after IVF but bilirubin still elevated at 2.3. ultrasound of abdomen to assess for biliary obstruction/dilation. This revealed moderate intrahepatic bile duct dilatation. 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 Refer to GI for possible ERCP. Stent placed. Now bili higher at 4.1 and LFTs higher. White count elevated. Admitted to hospital for repeat ERCP. They were able to extend stent, no PTC. Bilirubin now 1.4. ok to resume treatment. Repeat scope 9/7. WNL today. Replace beginning of January 2024     Turner Daniels, APRN-NP        The patient and family were allowed to ask questions and voice concerns; these were addressed to the best of our ability. They expressed understanding of what was explained to them, and they agreed with the present plan. Patient has the phone numbers for the [facility] and was instructed on how to contact us with any questions or concerns. My collaborating physician on this case is Dr. Josiah Lobo.

## 2022-03-21 ENCOUNTER — Encounter: Admit: 2022-03-21 | Discharge: 2022-03-21 | Payer: MEDICARE

## 2022-03-21 DIAGNOSIS — K219 Gastro-esophageal reflux disease without esophagitis: Secondary | ICD-10-CM

## 2022-03-21 DIAGNOSIS — C786 Secondary malignant neoplasm of retroperitoneum and peritoneum: Secondary | ICD-10-CM

## 2022-03-21 DIAGNOSIS — I454 Nonspecific intraventricular block: Secondary | ICD-10-CM

## 2022-03-21 DIAGNOSIS — C801 Malignant (primary) neoplasm, unspecified: Secondary | ICD-10-CM

## 2022-03-21 DIAGNOSIS — C181 Malignant neoplasm of appendix: Secondary | ICD-10-CM

## 2022-03-21 DIAGNOSIS — C189 Malignant neoplasm of colon, unspecified: Secondary | ICD-10-CM

## 2022-03-21 DIAGNOSIS — R52 Pain, unspecified: Secondary | ICD-10-CM

## 2022-03-21 LAB — COMPREHENSIVE METABOLIC PANEL: SODIUM: 138 MMOL/L (ref 137–147)

## 2022-03-21 LAB — CEA(CARCINOEMBRYONIC AG): CEA: 114 ng/mL — ABNORMAL HIGH (ref <3.0)

## 2022-03-21 LAB — CBC AND DIFF
MCH: 36 pg — ABNORMAL HIGH (ref 26–34)
MCHC: 34 g/dL (ref 32.0–36.0)
MCV: 106 FL — ABNORMAL HIGH (ref 80–100)
MPV: 7.8 FL — ABNORMAL HIGH (ref 7–11)
NEUTROPHILS %: 51 % (ref 41–77)
PLATELET COUNT: 452 K/UL — ABNORMAL HIGH (ref 150–400)
RDW: 16 % — ABNORMAL HIGH (ref 11–15)
WBC COUNT: 11 K/UL — ABNORMAL LOW (ref 4.5–11.0)

## 2022-03-21 LAB — CA19.9: CA 19-9: <1 U/mL — ABNORMAL LOW (ref <35)

## 2022-03-21 LAB — MAGNESIUM: MAGNESIUM: 1.3 mg/dL — ABNORMAL LOW (ref 1.6–2.6)

## 2022-03-21 MED ORDER — MAGNESIUM SULFATE IN D5W 1 GRAM/100 ML IV PGBK (CC ONLY)
1 g | Freq: Once | INTRAVENOUS | 0 refills | Status: CP
Start: 2022-03-21 — End: ?
  Administered 2022-03-21: 15:00:00 1 g via INTRAVENOUS

## 2022-03-21 MED ORDER — PALONOSETRON 0.25 MG/5 ML IV SOLN
.25 mg | Freq: Once | INTRAVENOUS | 0 refills | Status: CP
Start: 2022-03-21 — End: ?
  Administered 2022-03-21: 15:00:00 0.25 mg via INTRAVENOUS

## 2022-03-21 MED ORDER — SODIUM CHLORIDE 0.9 % IV SOLP
1000 mL | Freq: Once | INTRAVENOUS | 0 refills | Status: CP
Start: 2022-03-21 — End: ?
  Administered 2022-03-21: 15:00:00 1000 mL via INTRAVENOUS

## 2022-03-21 MED ORDER — IRINOTECAN IVPB
110 mg/m2 | Freq: Once | INTRAVENOUS | 0 refills | Status: CP
Start: 2022-03-21 — End: ?
  Administered 2022-03-21 (×3): 166.2 mg via INTRAVENOUS

## 2022-03-21 MED ORDER — FLUOROURACIL IV AMB PUMP
2400 mg/m2 | Freq: Once | INTRAVENOUS | 0 refills | Status: CP
Start: 2022-03-21 — End: ?
  Administered 2022-03-21 (×4): 3624 mg via INTRAVENOUS

## 2022-03-21 MED ORDER — ATROPINE 0.4 MG/ML IJ SOLN
.4 mg | INTRAVENOUS | 0 refills | Status: AC | PRN
Start: 2022-03-21 — End: ?
  Administered 2022-03-21: 16:00:00 0.4 mg via INTRAVENOUS

## 2022-03-21 MED ORDER — DEXAMETHASONE 4 MG PO TAB
4 mg | Freq: Once | ORAL | 0 refills | Status: CP
Start: 2022-03-21 — End: ?
  Administered 2022-03-21: 15:00:00 4 mg via ORAL

## 2022-03-21 NOTE — Patient Instructions
For any additional questions or concerns for Dr Kasi or Erin Carroll's clinic, please send us a mychart message or call Maddie or Ashely at [phone] from 8-4 Monday-Friday. If you need someone after 4 pm or over the weekend please call [phone].

## 2022-03-21 NOTE — Progress Notes
Patient arrived to [facility] treatment for Cycle 24 Day 1 Folfiri after being seen in clinic by Lanelle Bal, APRN; please refer to clinic note for assessment details.    Irinotecan given w/o complications, patient tolerated well. PAC positive for blood return, flushed with saline, and connected to ambulatory 5FU pump. All lines secure, clamps open and pump infusing when patient left [facility] treatment. Patient declined copy of labs and AVS. All questions and concerns addressed. Patient left [facility] treatment in stable condition.    CHEMO NOTE  Verified chemo consent signed and in chart.    Blood return positive via: Port (Single)    BSA and dose double checked (agree with orders as written) with: yes, see MAR    Labs/applicable tests checked: CBC, Comprehensive Metabolic Panel (CMP), UA, and Blood Pressure    Chemo regimen: C24D1    irinotecan (CAMPTOSAR) 166.2 mg in dextrose 5% (D5W) 508.31 mL IVPB     fluorouraciL (ADRUCIL) 3,624 mg in sodium chloride 0.9% (NS) 92 mL IV amb pump     Rate verified and armband double check with second RN: yes    Patient education offered and stated understanding. Denies questions at this time.

## 2022-03-27 ENCOUNTER — Encounter: Admit: 2022-03-27 | Discharge: 2022-03-27 | Payer: MEDICARE

## 2022-03-29 ENCOUNTER — Encounter: Admit: 2022-03-29 | Discharge: 2022-03-29 | Payer: MEDICARE

## 2022-03-30 ENCOUNTER — Encounter: Admit: 2022-03-30 | Discharge: 2022-03-30 | Payer: MEDICARE

## 2022-03-30 ENCOUNTER — Ambulatory Visit: Admit: 2022-03-30 | Discharge: 2022-03-30 | Payer: MEDICARE

## 2022-03-30 DIAGNOSIS — C801 Malignant (primary) neoplasm, unspecified: Secondary | ICD-10-CM

## 2022-03-30 DIAGNOSIS — I454 Nonspecific intraventricular block: Secondary | ICD-10-CM

## 2022-03-30 DIAGNOSIS — C786 Secondary malignant neoplasm of retroperitoneum and peritoneum: Secondary | ICD-10-CM

## 2022-03-30 DIAGNOSIS — C189 Malignant neoplasm of colon, unspecified: Secondary | ICD-10-CM

## 2022-03-30 DIAGNOSIS — C181 Malignant neoplasm of appendix: Secondary | ICD-10-CM

## 2022-03-30 DIAGNOSIS — K219 Gastro-esophageal reflux disease without esophagitis: Secondary | ICD-10-CM

## 2022-03-30 MED ORDER — PROPOFOL INJ 10 MG/ML IV VIAL
INTRAVENOUS | 0 refills | Status: DC
Start: 2022-03-30 — End: 2022-03-30

## 2022-03-30 MED ORDER — LIDOCAINE (PF) 200 MG/10 ML (2 %) IJ SYRG
INTRAVENOUS | 0 refills | Status: DC
Start: 2022-03-30 — End: 2022-03-30

## 2022-03-30 MED ORDER — CEFTRIAXONE 1 GRAM IJ SOLR
INTRAVENOUS | 0 refills | Status: DC
Start: 2022-03-30 — End: 2022-03-30

## 2022-03-30 MED ORDER — FENTANYL CITRATE (PF) 50 MCG/ML IJ SOLN
INTRAVENOUS | 0 refills | Status: DC
Start: 2022-03-30 — End: 2022-03-30

## 2022-03-30 MED ORDER — SUCCINYLCHOLINE CHLORIDE 20 MG/ML IJ SOLN
INTRAVENOUS | 0 refills | Status: DC
Start: 2022-03-30 — End: 2022-03-30

## 2022-03-30 NOTE — Anesthesia Pre-Procedure Evaluation
Anesthesia Pre-Procedure Evaluation    Name: Vicki Buchanan      MRN: [id number]     DOB: 03/09/1956     Age: 67 y.o.     Sex: female   _________________________________________________________________________     Procedure Info:   Procedure Information       Date/Time: 03/30/22 0730    Procedures:       ENDOSCOPIC RETROGRADE CHOLANGIOPANCREATOGRAPHY      ENDOSCOPIC RETROGRADE CHOLANGIOPANCREATOGRAPHY WITH REMOVAL AND EXCHANGE OF STENT BILIARY/ PANCREATIC DUCT - EACH STENT EXCHANGED    Location: ENDO 2 / ENDO/GI    Surgeons: Eliott Nine, MD            Physical Assessment  Vital Signs (last filed in past 24 hours):         Patient History   Allergies   Allergen Reactions    Indomethacin SEE COMMENTS     Pain for several days after suppository post ERCP. Tolerated IV but not PR    Shrimp SEE COMMENTS     Eye irritation. Namely blood vessel issues in the eyes         Current Medications    Medication Directions   calcium carbonate (TUMS) 500 mg (200 mg elemental calcium) chewable tablet Chew one tablet by mouth every 6 hours as needed.   carvediloL (COREG) 6.25 mg tablet Take one tablet by mouth twice daily. Take with food.  Hold if systolic blood pressure < 110   Cetirizine (ZYRTEC) 10 mg cap Take one capsule by mouth as Needed.   ciprofloxacin (CIPRO) 250 mg tablet Take one tablet by mouth every 48 hours.   dexAMETHasone (DECADRON) 4 mg tablet 1 tab by mouth with breakfast and afternoon snack on days 2 & 3 following each chemotherapy cycle. Do not take after 6 pm to avoid insomnia.   diphenoxylate-atropine (LOMOTIL) 2.5-0.025 mg tablet Take one tablet by mouth four times daily as needed for Diarrhea.   famotidine (PEPCID AC) 10 mg tablet Take one tablet by mouth twice daily.   LIDO/ANTACID/APAP 1:3:1 SUSP (MIXTURE COMPOUND) Take 30 mL (1 cup) by mouth every 4 hours as needed.   loperamide (IMODIUM A-D) 2 mg capsule Take one pill by mouth four times daily as needed   LORazepam (ATIVAN) 0.5 mg tablet Take 1-2 tabs by mouth every 6 hrs as needed N/V not controlled by Zofran or Compazine. May also use every 6 hrs as needed anxiety or at bedtime insomnia.   magnesium oxide 400 mg magnesium tablet Take one tablet by mouth twice daily.  Patient taking differently: Take one tablet by mouth twice daily as needed.   ondansetron (ZOFRAN ODT) 8 mg rapid dissolve tablet Dissolve one tablet by mouth every 8 hours as needed for Nausea or Vomiting. Place on tongue to disolve.   potassium chloride (KLOR-CON SPRINKLE) 10 mEq capsule Take one capsule by mouth twice daily. If potassium < 3.5. Take with a meal and a full glass of water.   psyllium husk (METAMUCIL) 0.4 gram cap Take one capsule by mouth daily as needed.   sod chlor,sod bicarb/neti pot (SINUS WASH NETI POT SINI) Use as directed   sodium chloride 0.9% (NS) 0.9 % 1,000 mL with potassium chloride 2 mEq/mL 20 mEq, magnesium sulfate 4 mEq/mL (50 %) 2 g IV infusion Administer 1L NS with K and 2g Mg once weekly via outpatient infusion.   traMADoL (ULTRAM) 50 mg tablet Take one tablet to two tablets by mouth daily as needed for Pain.  Review of Systems/Medical History      Patient summary reviewed  Nursing notes reviewed  Pertinent labs reviewed    PONV Screening: Non-smoker and Female sex    No family history of anesthetic complications      Airway - negative        Previous grade 1 view with MAC 3      Pulmonary - negative          Lung metastases s/p radiation      Cardiovascular         Exercise tolerance: >4 METS      Beta Blocker therapy: No      Beta blockers within 24 hours: n/a      Hypertension, well controlled                  GI/Hepatic/Renal             GERD, poorly controlled        Liver disease (biliary obstruction):         Renal disease: CKD Stage 3 (eGFR 30-59)           S/p HIPEC, 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        Neuro/Psych - negative        Musculoskeletal - negative        No back pain        Endocrine/Other           No anemia        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, lung metastasis):    current and chemotherapy        04/2017 Port a cath placement     Constitution - negative       PHYSICAL EXAM     Previous Airway Procedure Notes Displaying the 3 most recent records      Date Mask Difficulty Techninque used for succesful ETT placement Blade Type Blade Size View with successful placement Number of attempts    12/22/21 0 - not attempted direct laryngoscopy Macintosh 3 grade I - full view of glottis 1            Patient Lines/Drains/Airways Status       Active Lines:       Name Placement date Placement time Site Days    Implantable Port-a-cath Single Lumen 04/23/17 0845 04/23/17  0845  Subclavian, Left  1801    Ileostomy 04/03/17 0951 Lower Right Quadrant 04/03/17  0951  Lower Right Quadrant  1821                  Diagnostic Tests  Hematology:   Lab Results   Component Value Date    HGB 9.1 03/21/2022    HCT 26.7 03/21/2022    PLTCT 452 03/21/2022    WBC 11.0 03/21/2022    NEUT 51 03/21/2022    ANC 5.40 03/21/2022    LYMPH 55 02/28/2022    ALC 2.60 03/21/2022    MONA 18 03/21/2022    AMC 2.00 03/21/2022    EOSA 8 03/21/2022    ABC 0.00 03/21/2022    MCV 106.2 03/21/2022    MCH 36.3 03/21/2022    MCHC 34.2 03/21/2022    MPV 7.8 03/21/2022    RDW 16.1 03/21/2022         General Chemistry:   Lab Results   Component Value Date    NA 138 03/21/2022    K 3.4 03/21/2022    CL  98 03/21/2022    CO2 30 03/21/2022    GAP 10 03/21/2022    BUN 29 03/21/2022    CR 2.33 03/21/2022    GLU 133 03/21/2022    CA 9.2 03/21/2022    ALBUMIN 3.7 03/21/2022    MG 1.3 03/21/2022    TOTBILI 0.6 03/21/2022    PO4 3.3 01/30/2022      Coagulation:   Lab Results   Component Value Date    PTT 28.7 07/10/2018    INR 1.3 07/10/2018       PAC Plan    Anesthesia Plan    ASA score: 3   Plan: general        Alerts: No

## 2022-04-01 ENCOUNTER — Encounter: Admit: 2022-04-01 | Discharge: 2022-04-01 | Payer: MEDICARE

## 2022-04-01 DIAGNOSIS — C189 Malignant neoplasm of colon, unspecified: Secondary | ICD-10-CM

## 2022-04-01 DIAGNOSIS — I454 Nonspecific intraventricular block: Secondary | ICD-10-CM

## 2022-04-01 DIAGNOSIS — C801 Malignant (primary) neoplasm, unspecified: Secondary | ICD-10-CM

## 2022-04-01 DIAGNOSIS — C181 Malignant neoplasm of appendix: Secondary | ICD-10-CM

## 2022-04-01 DIAGNOSIS — K219 Gastro-esophageal reflux disease without esophagitis: Secondary | ICD-10-CM

## 2022-04-19 ENCOUNTER — Encounter: Admit: 2022-04-19 | Discharge: 2022-04-19 | Payer: MEDICARE

## 2022-04-27 ENCOUNTER — Encounter: Admit: 2022-04-27 | Discharge: 2022-04-27 | Payer: MEDICARE

## 2022-04-27 DIAGNOSIS — C786 Secondary malignant neoplasm of retroperitoneum and peritoneum: Secondary | ICD-10-CM

## 2022-04-27 DIAGNOSIS — K219 Gastro-esophageal reflux disease without esophagitis: Secondary | ICD-10-CM

## 2022-04-27 DIAGNOSIS — C189 Malignant neoplasm of colon, unspecified: Secondary | ICD-10-CM

## 2022-04-27 DIAGNOSIS — I454 Nonspecific intraventricular block: Secondary | ICD-10-CM

## 2022-04-27 DIAGNOSIS — C801 Malignant (primary) neoplasm, unspecified: Secondary | ICD-10-CM

## 2022-04-27 DIAGNOSIS — C181 Malignant neoplasm of appendix: Secondary | ICD-10-CM

## 2022-04-27 LAB — CBC AND DIFF
ABSOLUTE BASO COUNT: 0.2 K/UL (ref 0–0.20)
ABSOLUTE EOS COUNT: 0.9 K/UL — ABNORMAL HIGH (ref 0–0.45)
ABSOLUTE LYMPH COUNT: 2.8 K/UL (ref 1.0–4.8)
ABSOLUTE MONO COUNT: 1.9 K/UL — ABNORMAL HIGH (ref 0–0.80)
ABSOLUTE NEUTROPHIL: 6.1 K/UL (ref 1.8–7.0)
BASOPHILS %: 2 % — ABNORMAL LOW (ref >60)
EOSINOPHILS %: 7 % — ABNORMAL HIGH (ref 0–5)
LYMPHOCYTES %: 23 % — ABNORMAL LOW (ref 24–44)
MCH: 35 pg — ABNORMAL HIGH (ref 26–34)
MCHC: 33 g/dL (ref 32.0–36.0)
MCV: 105 FL — ABNORMAL HIGH (ref 80–100)
MONOCYTES %: 16 % — ABNORMAL HIGH (ref 4–12)
MPV: 7.5 FL — ABNORMAL HIGH (ref 7–11)
NEUTROPHILS %: 52 % — ABNORMAL HIGH (ref 41–77)
PLATELET COUNT: 377 K/UL (ref 150–400)
RDW: 15 % — ABNORMAL HIGH (ref 11–15)
WBC COUNT: 11 K/UL — ABNORMAL HIGH (ref 4.5–11.0)

## 2022-04-27 LAB — CA19.9: CA 19-9: <1 U/mL — ABNORMAL LOW (ref <35)

## 2022-04-27 LAB — CEA(CARCINOEMBRYONIC AG): CEA: 100 ng/mL — ABNORMAL HIGH (ref <3.0)

## 2022-04-27 LAB — COMPREHENSIVE METABOLIC PANEL: SODIUM: 139 MMOL/L (ref 137–147)

## 2022-04-27 LAB — MAGNESIUM: MAGNESIUM: 1.1 mg/dL — ABNORMAL LOW (ref 1.6–2.6)

## 2022-04-27 MED ORDER — IRINOTECAN IVPB
110 mg/m2 | Freq: Once | INTRAVENOUS | 0 refills | Status: CP
Start: 2022-04-27 — End: ?
  Administered 2022-04-27 (×3): 166.2 mg via INTRAVENOUS

## 2022-04-27 MED ORDER — DEXAMETHASONE 4 MG PO TAB
4 mg | Freq: Once | ORAL | 0 refills | Status: CP
Start: 2022-04-27 — End: ?
  Administered 2022-04-27: 17:00:00 4 mg via ORAL

## 2022-04-27 MED ORDER — FLUOROURACIL IV AMB PUMP
2400 mg/m2 | Freq: Once | INTRAVENOUS | 0 refills | Status: CP
Start: 2022-04-27 — End: ?
  Administered 2022-04-27 (×4): 3624 mg via INTRAVENOUS

## 2022-04-27 MED ORDER — SODIUM CHLORIDE 0.9 % IV SOLP
1000 mL | Freq: Once | INTRAVENOUS | 0 refills | Status: CP
Start: 2022-04-27 — End: ?
  Administered 2022-04-27: 17:00:00 1000 mL via INTRAVENOUS

## 2022-04-27 MED ORDER — BEVACIZUMAB-AWWB IVPB
5 mg/kg | Freq: Once | INTRAVENOUS | 0 refills | Status: CP
Start: 2022-04-27 — End: ?
  Administered 2022-04-27 (×2): 260.5 mg via INTRAVENOUS

## 2022-04-27 MED ORDER — PALONOSETRON 0.25 MG/5 ML IV SOLN
.25 mg | Freq: Once | INTRAVENOUS | 0 refills | Status: CP
Start: 2022-04-27 — End: ?
  Administered 2022-04-27: 17:00:00 0.25 mg via INTRAVENOUS

## 2022-04-27 MED ORDER — ATROPINE 0.4 MG/ML IJ SOLN
.4 mg | INTRAVENOUS | 0 refills | Status: AC | PRN
Start: 2022-04-27 — End: ?
  Administered 2022-04-27: 17:00:00 0.4 mg via INTRAVENOUS

## 2022-04-27 MED ORDER — MAGNESIUM SULFATE IN D5W 1 GRAM/100 ML IV PGBK (CC ONLY)
1 g | INTRAVENOUS | 0 refills | Status: CP
Start: 2022-04-27 — End: ?
  Administered 2022-04-27 (×2): 1 g via INTRAVENOUS

## 2022-04-27 NOTE — Progress Notes
CHEMO NOTE  Verified chemo consent signed and in chart.    Blood return positive via: Port (Single, Power Port, and Accessed)    BSA and dose double checked (agree with orders as written) with: yes Adolph Pollack, RN    Labs/applicable tests checked: CBC, Comprehensive Metabolic Panel (CMP), and BP    Chemo regimen: C25D1    bevacizumab-awwb (MVASI) 260.5 mg in sodium chloride 0.9% (NS) 110.42 mL IVPB     irinotecan (CAMPTOSAR) 166.2 mg in dextrose 5% (D5W) 508.31 mL IVPB     fluorouraciL (ADRUCIL) 3,624 mg in sodium chloride 0.9% (NS) 92 mL IV amb pump     Rate verified and armband double check with second RN: yes    Patient education offered and stated understanding. Denies questions at this time.    Patient arrived to [facility] treatment after being seen in clinic with Dr. Eldred Manges; please refer to clinic note for assessment details. Premeds given per treatment plan. MVASI and Irinotecan given w/o complications, patient tolerated well. One dose of Atropine given prior to Irinotecan per pt. request. Port positive for blood return, flushed with saline, and currently infusing 5FU via ambulatory pump. All connections secure, unclamped, and face of pump reads infusing. Cadd unhook scheduled to be completed closer to home. Patient declined copy of labs and AVS. All questions and concerns addressed. Patient left [facility] treatment w/ husband, Simona Huh, in stable condition.

## 2022-05-05 ENCOUNTER — Encounter: Admit: 2022-05-05 | Discharge: 2022-05-05 | Payer: MEDICARE

## 2022-05-05 NOTE — Progress Notes
Date of Service: 05/10/2022    SUBJECTIVE:             Reason for Visit:  Follow Up    Vicki Buchanan is a 67 y.o. 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  02/19/17 diagnostic laparoscopy with biopsies of the right diaphragm, right liver, right fallopian tube and left pelvis.  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  Planned for exploratory laparotomy with CRS/HIPEC in combination with a TAH/BSO in 03/2017.  Developed intestinal obstruction and taken to OR for CRS/HIPEC surgery; however multiple adhesions, omental cracking. CRS/HIPEC was aborted.  Immunotherapy on epacadostat + sirolimus trial 12/24/17-05/21/18.  She stopped study drug on 05/21/18 for mitomycin HIPEC trial at [facility] [place].  On 05/28/18 diagnostic laparoscopy, evacuation of mucinous ascites 1.5L, peritoneal biopsy, laparoscopic HIPEC with mitomycin-C in [place] by Dr. Daryl Eastern.  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  Completed second laparoscopic HIPEC on 06/25/18.  Again 1.5L removed.  09/12/18 underwent debulking at [facility] in [place], [place].  Removed 98% by Dr. [name].  Discharged on 09/30/18.  She had L pleural effusion requiring chest tube drainage; intra-abd abscess 10/2018; discharged on 11/04/18 with drain and IV cefepime and Diflucan. Her ureteral stents were removed and ostomy relocated in 10/2018.  Hospitalized in 01/2019 for AKI 2/2 dehydration and likely PE (V/Q scan and trop elevation). CEA increasing again. Repeat CT CAP 03/20/19 with stable to slight increase in cancer and RUL nodule.  Discussed observation vs treatment. She chose observation and is considering repeat CRS + HIPEC via Dr. [name] in [place].  CT scans from 09/24/19 of the abdomen, without contrast, appear stable.  The peritoneal thickening, carcinomatosis, retroperitoneal lymph nodes, a small amount of fluid collection or trace ascites, appear to be stable compared to 3 months ago. 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  CT scan on 08/17/20 showed a right lung mass that is slightly increased from 11 mm to 14 mm. There are some new lung nodules that have developed, a small one in the left lung, a new nodule in the right lung. Inside the abdomen we are seeing continued increase in size of the peritoneal nodules. The largest measuring 14 mm compared to 12 mm.  The nodule lower in the pelvis increased from 16 mm to 21 mm.  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  TMB = 1.3.  S/p CRS w/ ostomy revision and tumor removal around stoma on 10/14/20.  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 [name] PA from [facility] in [place] called to relay their TB recommendations for patient.  Recommendations to proceed with FOLFIRI/Avastin with holding Avastin 6-8 weeks post-op. They also request if our office can repeat genomic testing.  Repeat TEMPUS from 10/14/20.  Pre-screen for KRAS G12D inhibitor trial, if not eligible will plan for FOLFIRI +/- bevacizumab.        Interval Hx 05/10/22:     The patient, a long-term appendiceal cancer, presents today for her next treatment. She did much better with the last treatment. The 4 week break for her vacation helped. They report a decrease in creatinine levels and a change in ileostomy output consistency, which has become thicker and less runny. The patient also reports heartburn, which they manage with Nexium and magic mouth wash. Her energy  was much better and she tolerated the chemo and was able to stay active afterwards. The skin around her stoma is better and she did not need to see wound/ostomy for the skin breakdown.             Review of Systems   Constitutional:  Positive for fatigue (better). Negative for activity change, appetite change, chills, diaphoresis, fever and unexpected weight change.   HENT:  Negative for facial swelling, mouth sores, sore throat, trouble swallowing and voice change.    Eyes: Negative.  Negative for visual disturbance.   Respiratory: Negative.  Negative for cough, chest tightness, shortness of breath and wheezing.    Cardiovascular: Negative.  Negative for chest pain, palpitations and leg swelling.   Gastrointestinal:  Positive for diarrhea (better). Negative for abdominal distention, abdominal pain, anal bleeding, blood in stool, constipation, nausea, rectal pain and vomiting.   Genitourinary: Negative.  Negative for difficulty urinating, dysuria and hematuria.   Musculoskeletal: Negative.  Negative for arthralgias, back pain, neck pain and neck stiffness.   Skin:  Negative for color change, pallor, rash and wound.   Neurological:  Positive for numbness. Negative for dizziness, weakness, light-headedness and headaches.   Hematological:  Does not bruise/bleed easily.   Psychiatric/Behavioral: Negative.  Negative for agitation. The patient is not nervous/anxious.        Medical History:   Diagnosis Date    Acid reflux     Bundle branch block     Cancer (HCC)     Cancer of appendix (HCC)     Cancer of colon (HCC) 11/19     Surgical History:   Procedure Laterality Date    CESAREAN SECTION  1981    HX CHOLECYSTECTOMY  1995    LAPAROSCOPY  02/2017    biopsy peritoneal    COLONOSCOPY DIAGNOSTIC WITH SPECIMEN COLLECTION BY BRUSHING/ WASHING - FLEXIBLE N/A 03/19/2017    Performed by Everardo All, MD at [facility] ENDO    COLONOSCOPY WITH BIOPSY - FLEXIBLE  03/19/2017    Performed by Everardo All, MD at [facility] ENDO    EXPLORATORY LAPAROTOMY, DIVERTING LOOP ILEOSTOMY N/A 04/03/2017    Performed by Loralie Champagne, MD at [facility] OR    INSERTION TUNNELED CENTRAL VENOUS CATHETER - AGE 16 YEARS AND OVER Left 04/23/2017    Performed by Loralie Champagne, MD at [facility] OR    CYSTOURETHROSCOPY WITH INDWELLING URETERAL STENT INSERTION Bilateral 04/09/2018    Performed by Earl Many, MD at [facility] OR CYSTOURETHROSCOPY WITH URETERAL CATHETERIZATION WITH/ WITHOUT IRRIGATION/ INSTILLATION/ URETEROPYELOGRAPHY Bilateral 04/09/2018    Performed by Earl Many, MD at [facility] OR    CYSTOURETHROSCOPY WITH INDWELLING URETERAL STENT EXCHANGE (RIGHT 6 x 26cm / LEFT 6 x 28cm) Bilateral 08/08/2018    Performed by Earl Many, MD at [facility] OR    RETROGRADE UROGRAPHY WITH/ WITHOUT KUB Bilateral 08/08/2018    Performed by Earl Many, MD at [facility] OR    ENDOSCOPIC RETROGRADE CHOLANGIOPANCREATOGRAPHY WITH SPHINCTEROTOMY/ PAPILLOTOMY N/A 09/16/2021    Performed by Vertell Novak, MD at [facility] ENDO    ESOPHAGOGASTRODUODENOSCOPY WITH ENDOSCOPIC ULTRASOUND EXAMINATION - FLEXIBLE  09/16/2021    Performed by Vertell Novak, MD at [facility] ENDO    ENDOSCOPIC RETROGRADE CHOLANGIOPANCREATOGRAPHY WITH PLACEMENT ENDOSCOPIC STENT INTO BILIARY/ PANCREATIC DUCT - EACH STENT  09/16/2021    Performed by Vertell Novak, MD at [facility] ENDO    COLONOSCOPY DIAGNOSTIC WITH SPECIMEN COLLECTION BY BRUSHING/ WASHING - FLEXIBLE N/A 09/26/2021    Performed by Vertell Novak, MD  at [facility] OR    ENDOSCOPIC RETROGRADE CHOLANGIOPANCREATOGRAPHY WITH PLACEMENT ENDOSCOPIC STENT INTO BILIARY/ PANCREATIC DUCT - EACH STENT  09/29/2021    Performed by Eliott Nine, MD at [facility] ENDO    ENDOSCOPIC RETROGRADE CHOLANGIOPANCREATOGRAPHY with plastic stent exchange N/A 10/27/2021    Performed by Eliott Nine, MD at [facility] ENDO    ENDOSCOPIC RETROGRADE CHOLANGIOPANCREATOGRAPHY WITH ABLATION TUMOR/ POLYP/ OTHER LESION  10/27/2021    Performed by Eliott Nine, MD at [facility] ENDO    ENDOSCOPIC RETROGRADE CHOLANGIOPANCREATOGRAPHY WITH REMOVAL CALCULI/ DEBRIS FROM BILIARY/ PANCREATIC DUCT  10/27/2021    Performed by Eliott Nine, MD at [facility] ENDO    ENDOSCOPIC RETROGRADE CHOLANGIOPANCREATOGRAPHY  plastic stent replacement N/A 12/22/2021    Performed by Eliott Nine, MD at [facility] ENDO    ENDOSCOPIC RETROGRADE CHOLANGIOPANCREATOGRAPHY N/A 03/30/2022    Performed by Eliott Nine, MD at [facility] ENDO    ENDOSCOPIC RETROGRADE CHOLANGIOPANCREATOGRAPHY WITH REMOVAL AND EXCHANGE OF STENT BILIARY/ PANCREATIC DUCT - EACH STENT EXCHANGED N/A 03/30/2022    Performed by Eliott Nine, MD at [facility] ENDO    COLON SURGERY  7/20    COLON SURGERY  7/20    HX APPENDECTOMY  1/10    Guess on date    HX CESAREAN SECTION      HX HYSTERECTOMY  7/20    HX LOWER ANTERIOR RESECTION OF COLON      7/20    HX SALPINGO-OOPHORECTOMY      ILEOSTOMY OR JEJUNOSTOMY Right     LIVER SURGERY  12/24/21    PORTACATH PLACEMENT  1/20    PR PNCRTECT PROX STOT W/PANCREATOJEJUNOSTOMY  7/20    TUNNELED VENOUS PORT PLACEMENT Left     URETER STENT PLACEMENT Bilateral      Family History   Problem Relation Age of Onset    Diabetes Mother     Cancer Father     Diabetes Brother     Cancer-Lung Paternal Aunt     Cancer-Breast Maternal Grandmother     Diabetes Paternal Grandmother      Social History     Socioeconomic History    Marital status: Married   Tobacco Use    Smoking status: Never    Smokeless tobacco: Never   Vaping Use    Vaping status: Never Used   Substance and Sexual Activity    Alcohol use: No     Comment: rarely    Drug use: No         OBJECTIVE:          calcium carbonate (TUMS) 500 mg (200 mg elemental calcium) chewable tablet Chew one tablet by mouth every 6 hours as needed.    carvediloL (COREG) 6.25 mg tablet Take one tablet by mouth twice daily. Take with food.  Hold if systolic blood pressure < 110    Cetirizine (ZYRTEC) 10 mg cap Take one capsule by mouth as Needed.    ciprofloxacin (CIPRO) 250 mg tablet Take one tablet by mouth every 48 hours.    dexAMETHasone (DECADRON) 4 mg tablet 1 tab by mouth with breakfast and afternoon snack on days 2 & 3 following each chemotherapy cycle. Do not take after 6 pm to avoid insomnia.    diphenoxylate-atropine (LOMOTIL) 2.5-0.025 mg tablet Take one tablet by mouth four times daily as needed for Diarrhea.    famotidine (PEPCID AC) 10 mg tablet Take one tablet by mouth twice daily.    LIDO/ANTACID/APAP 1:3:1 SUSP (MIXTURE COMPOUND) Take 30 mL (1 cup) by mouth every  4 hours as needed.    loperamide (IMODIUM A-D) 2 mg capsule Take one pill by mouth four times daily as needed    LORazepam (ATIVAN) 0.5 mg tablet Take 1-2 tabs by mouth every 6 hrs as needed N/V not controlled by Zofran or Compazine. May also use every 6 hrs as needed anxiety or at bedtime insomnia.    magnesium oxide 400 mg magnesium tablet Take one tablet by mouth twice daily. (Patient taking differently: Take one tablet by mouth twice daily as needed.)    ondansetron (ZOFRAN ODT) 8 mg rapid dissolve tablet Dissolve one tablet by mouth every 8 hours as needed for Nausea or Vomiting. Place on tongue to disolve.    potassium chloride (KLOR-CON SPRINKLE) 10 mEq capsule Take one capsule by mouth twice daily. If potassium < 3.5. Take with a meal and a full glass of water.    psyllium husk (METAMUCIL) 0.4 gram cap Take one capsule by mouth daily as needed.    sod chlor,sod bicarb/neti pot (SINUS WASH NETI POT SINI) Use as directed    sodium chloride 0.9% (NS) 0.9 % 1,000 mL with potassium chloride 2 mEq/mL 20 mEq, magnesium sulfate 4 mEq/mL (50 %) 2 g IV infusion Administer 1L NS with K and 2g Mg once weekly via outpatient infusion.    traMADoL (ULTRAM) 50 mg tablet Take one tablet to two tablets by mouth daily as needed for Pain.     Vitals:    05/10/22 0824   BP: (!) 147/87   BP Source: Arm, Right Upper   Pulse: 82   Temp: 36.9 ?C (98.4 ?F)   Resp: 16   SpO2: 100%   TempSrc: Temporal   PainSc: Zero   Weight: 51.4 kg (113 lb 6.4 oz)       Body mass index is 20.09 kg/m?Marland Kitchen     Pain Score: Zero       Pain Addressed:  N/A    Patient Evaluated for a Clinical Trial: Patient not eligible for a treatment trial (including not needing treatment, needs palliative care, in remission).     [place] Cooperative Oncology Group performance status is 1, Restricted in physically strenuous activity but ambulatory and able to carry out work of a light or sedentary nature, e.g., light house work, office work.       Physical Exam  Vitals reviewed.   Constitutional:       General: She is not in acute distress.     Appearance: Normal appearance. She is well-developed. She is not ill-appearing, toxic-appearing or diaphoretic.      Comments: Hair thinning.    HENT:      Head: Normocephalic and atraumatic.      Nose: Nose normal. No rhinorrhea.      Mouth/Throat:      Mouth: Mucous membranes are moist. Mucous membranes are not pale. No oral lesions.      Pharynx: Oropharynx is clear. No oropharyngeal exudate or posterior oropharyngeal erythema.      Tonsils: No tonsillar abscesses.   Eyes:      General: No scleral icterus.        Right eye: No discharge.         Left eye: No discharge.      Extraocular Movements: Extraocular movements intact.      Conjunctiva/sclera: Conjunctivae normal.      Pupils: Pupils are equal, round, and reactive to light.   Cardiovascular:      Rate and Rhythm: Normal rate and regular  rhythm.      Pulses: Normal pulses.      Heart sounds: Normal heart sounds. No murmur heard.     No gallop.   Pulmonary:      Effort: Pulmonary effort is normal. No respiratory distress.      Breath sounds: No stridor. No wheezing, rhonchi or rales.   Chest:      Chest wall: No tenderness.      Comments: Port site WNL  Abdominal:      General: A surgical scar is present. The ostomy site is clean. Bowel sounds are normal. There is no distension.      Palpations: There is no mass.      Tenderness: There is no abdominal tenderness. There is no guarding or rebound.      Hernia: No hernia is present.          Comments: Firmness in epigastric area.    Musculoskeletal:         General: No swelling, tenderness, deformity or signs of injury. Normal range of motion.      Cervical back: Normal range of motion and neck supple. No rigidity. No muscular tenderness.      Right lower leg: No edema.      Left lower leg: No edema.   Lymphadenopathy:      Cervical: No cervical adenopathy.   Skin:     General: Skin is warm and dry.      Coloration: Skin is not jaundiced or pale.      Findings: No bruising, erythema, lesion or rash.   Neurological:      General: No focal deficit present.      Mental Status: She is alert and oriented to person, place, and time. Mental status is at baseline.      Cranial Nerves: No cranial nerve deficit.      Motor: No weakness.      Coordination: Coordination normal.      Gait: Gait normal.   Psychiatric:         Mood and Affect: Mood normal.         Behavior: Behavior normal.         Thought Content: Thought content normal.         Judgment: Judgment normal.            CBC w/DIFF      Latest Ref Rng & Units 05/10/2022     7:56 AM 04/27/2022     7:35 AM 03/21/2022     7:36 AM 02/28/2022    10:00 AM 02/28/2022     7:38 AM   CBC with Diff   White Blood Cells 4.5 - 11.0 K/UL 7.6  11.8  11.0  5.0  4.1    RBC 4.0 - 5.0 M/UL 2.44  2.26  2.51  2.18  2.31    Hemoglobin 12.0 - 15.0 GM/DL 8.7  8.0  9.1  8.0  8.2    Hematocrit 36 - 45 % 25.5  23.9  26.7  22.9  24.5    MCV 80 - 100 FL 104.7  105.6  106.2  104.9  105.9    MCH 26 - 34 PG 35.6  35.6  36.3  36.5  35.4    MCHC 32.0 - 36.0 G/DL 09.6  04.5  40.9  81.1  33.4    RDW 11 - 15 % 15.0  15.2  16.1  17.3  17.2    Platelet Count 150 - 400 K/UL 262  377  452  381  350    MPV 7 - 11 FL 8.8  7.5  7.8  7.4  7.6    Neutrophils 41 - 77 % 67  52  51   20    Absolute Neutrophil Count 1.8 - 7.0 K/UL 5.10  6.10  5.40   0.80    Lymphocytes 24 - 44 % 17  23  23    45    Absolute Lymph Count 1.0 - 4.8 K/UL 1.30  2.80  2.60   1.90    Monocytes 4 - 12 % 12  16  18   30     Absolute Monocyte Count 0 - 0.80 K/UL 0.90  1.90  2.00   1.20    Eosinophils 0 - 5 % 4  7  8   4     Absolute Eosinophil Count 0 - 0.45 K/UL 0.30  0.90  0.90   0.20    Basophils 0 - 2 % 0  2  0   1    Absolute Basophil Count 0 - 0.20 K/UL 0.00  0.20  0.00   0.00      Comprehensive Metabolic Profile      Latest Ref Rng & Units 05/10/2022     7:56 AM 04/27/2022     7:35 AM 03/21/2022     7:36 AM 02/28/2022     7:38 AM 02/13/2022     9:42 AM   CMP   Sodium 137 - 147 MMOL/L 138  139  138  140  138    Potassium 3.5 - 5.1 MMOL/L 3.6  4.3  3.4  3.4  3.9    Chloride 98 - 110 MMOL/L 105  109  98  101  101    CO2 21 - 30 MMOL/L 24  22  30  30  28     Anion Gap 3 - 12 9  8  10  9  9     Blood Urea Nitrogen 7 - 25 MG/DL 31  19  29  19  22     Creatinine 0.4 - 1.00 MG/DL 1.61  0.96  0.45  4.09  2.26    Glucose 70 - 100 MG/DL 811  98  914  782  90    Calcium 8.5 - 10.6 MG/DL 8.2  8.8  9.2  8.4  9.2    Total Protein 6.0 - 8.0 G/DL 6.5  7.3  7.4  6.5  7.3    Albumin 3.5 - 5.0 G/DL 3.5  3.6  3.7  3.4  3.8    Alk Phosphatase 25 - 110 U/L 163  347  149  134  157    ALT (SGPT) 7 - 56 U/L 20  46  10  12  15     Total Bilirubin 0.3 - 1.2 MG/DL 0.9  0.7  0.6  0.6  0.6      Tumor Markers  Lab Results   Component Value Date    CEA 1,003.3 (H) 04/27/2022    CEA 1,144.3 (H) 03/21/2022    CEA 838.9 (H) 02/28/2022    CEA 1,020.1 (H) 02/13/2022    CEA 911.3 (H) 01/30/2022    CEA 838.2 (H) 01/09/2022    CEA 511.8 (H) 12/12/2021    CEA 761.7 (H) 11/28/2021    CEA 681.0 (H) 11/10/2021    CEA 482.6 (H) 10/12/2021    CA199 <1 04/27/2022    CA199 <1 03/21/2022    CA199 <1 02/28/2022    CA199 <  1 02/13/2022    CA199 <1 01/30/2022    CA199 <1 01/09/2022    CA199 <1 12/12/2021    CA199 <1 11/28/2021    CA199 <1 11/10/2021    CA199 <1 09/28/2021    CA125 41 (H) 01/21/2018    CA125 42 (H) 11/21/2017    CA125 46 (H) 11/05/2017    CA125 41 (H) 10/22/2017    CA125 34 10/08/2017    CA125 34 09/24/2017    CA125 31 09/10/2017    CA125 29 08/27/2017    CA125 34 08/15/2017    CA125 39 (H) 07/30/2017           Imaging:  - 04/27/21 CT CAP without contrast  CHEST:   1.  No significant change in small indeterminate anterior epiphrenic lymph nodes. No new thoracic lymphadenopathy.   2. No significant change to further mild increase in size of pulmonary and pleural metastases.   3. Increasing nodular and tree-in-bud opacities in the lingula and development of mild focal consolidation in the left lower lobe, indeterminate and may be infectious/inflammatory nodules or potentially pulmonary metastases.       ABDOMEN AND PELVIS:   1.  Grossly unchanged extensive peritoneal carcinomatosis.   2. No significant change in small residual indeterminate retroperitoneal lymph nodes. No discrete new abdominopelvic lymphadenopathy.   3. Grossly unchanged right anterior abdominal wall metastases.     CT ABD/PELV WO CONTRAST  Narrative: CT CHEST, ABDOMEN AND PELVIS    Clinical Indication: Female, 67 years old. Cancer of appendix. Peritoneal carcinomatosis.    Technique: Multiple contiguous axial images were obtained through the chest, abdomen and pelvis without IV contrast material. Post processing coronal and sagittal reconstruction images were made from the axial images.      IV contrast: None  Bowel contrast:  None    Comparison: Noncontrast CT chest, abdomen, and pelvis on 12/12/2021.    CHEST FINDINGS:    Evaluation of the mediastinum and hila, including the vasculature and for lymphadenopathy, is limited without the use of IV contrast.    Lower Neck: Unremarkable.    Axilla, Mediastinum and Hila: No thoracic lymphadenopathy. Unchanged small anterior diaphragmatic and lower paraesophageal lymph nodes that are indeterminate.     Heart and Great Vessels: Left subclavian chest port terminates in the SVC. Heart size normal. Coronary artery calcifications. Thoracic aorta normal in caliber. Mild calcified atherosclerotic plaque.    Airway, Lungs and Pleura: Several areas of peripheral mucous plugging, for example in the lingula. Improved previously new pulmonary opacities in the right lower lobe. No pleural effusion. No significant change in size of pulmonary and pleural metastases. Previously measured metastases as follows:    Right upper lobe, 2.3 cm (series 2 image 13) previously 2.3 cm.  Right lower lobe, 0.9 cm (series 2 image 38) previously 0.9 cm.    Chest Wall and Osseous Structures: No destructive osseous lesion.     Abdomen and Pelvis Findings:    Limited evaluation without the use of IV contrast which includes the viscera and vasculature.    Liver and Biliary system: Liver is normal in size. Persistent scalloping of the hepatic margins from peritoneal metastases. Unchanged right hepatic cyst. No obvious new parenchymal hepatic mass on noncontrast CT. Indwelling biliary stents. Persistent mild intrahepatic biliary ductal dilatation and pneumobilia.     Spleen: Splenectomy.    Adrenal Glands and Kidneys: Adrenal glands unremarkable. Several subcentimeter renal calculi. No hydronephrosis.    Pancreas and Retroperitoneum: Unopacified pancreas is partially obscured due  to extensive peritoneal metastases. No significant change in small indeterminate retroperitoneal lymph nodes. No obvious new lymphadenopathy.    Aorta and Major Vessels: Normal caliber abdominal aorta. Mild calcified atherosclerotic plaque.     Bowel, Mesentery and Peritoneal space: Distal colonic anastomosis and right lower quadrant ostomy. Omentectomy. No bowel obstruction. Grossly unchanged extensive peritoneal metastases, many of which are partially calcified.    Pelvis: Hysterectomy. Persistent gas distention of the vagina with trace layering fluid. Urinary bladder is incompletely distended and not well evaluated. No discrete pelvic lymphadenopathy.    Abdominal wall and Osseous Structures: Midline ventral abdominal incision scar. Grossly unchanged right anterior abdominal wall metastases. Previously measured subcutaneous right ventral abdominal wall metastasis measures 2.1 x 1.6 cm (series 2 image 139) previously 2.1 x 1.6 cm.    Unchanged mild superior endplate compression deformity at L1. Development of mild superior endplate compression deformity at L3. Grossly unchanged asymmetric marrow in the left sacrum (series 2 image 174).  Impression: CHEST:  1. No significant change in small indeterminate anterior diaphragmatic and paraesophageal lymph nodes. No new thoracic lymphadenopathy.    2. No significant change in pulmonary and pleural metastases.    ABDOMEN AND PELVIS:  1. Grossly unchanged extensive peritoneal carcinomatosis and right abdominal wall metastases.    2. No new abdominopelvic lymphadenopathy.    3. Development of mild superior endplate compression deformity at L3. Unchanged mild superior endplate compression deformity at L1.    4. Unchanged asymmetric marrow in the left sacrum, nonspecific and incompletely characterized on this exam but may be from osseous metastasis.     Finalized by Ancil Boozer, M.D. on 03/30/2022 12:44 PM. Dictated by Ancil Boozer, M.D. on 03/30/2022 12:14 PM.  CT CHEST WO CONTRAST  Narrative: CT CHEST, ABDOMEN AND PELVIS    Clinical Indication: Female, 67 years old. Cancer of appendix. Peritoneal carcinomatosis.    Technique: Multiple contiguous axial images were obtained through the chest, abdomen and pelvis without IV contrast material. Post processing coronal and sagittal reconstruction images were made from the axial images.      IV contrast: None  Bowel contrast:  None    Comparison: Noncontrast CT chest, abdomen, and pelvis on 12/12/2021.    CHEST FINDINGS:    Evaluation of the mediastinum and hila, including the vasculature and for lymphadenopathy, is limited without the use of IV contrast.    Lower Neck: Unremarkable.    Axilla, Mediastinum and Hila: No thoracic lymphadenopathy. Unchanged small anterior diaphragmatic and lower paraesophageal lymph nodes that are indeterminate.     Heart and Great Vessels: Left subclavian chest port terminates in the SVC. Heart size normal. Coronary artery calcifications. Thoracic aorta normal in caliber. Mild calcified atherosclerotic plaque.    Airway, Lungs and Pleura: Several areas of peripheral mucous plugging, for example in the lingula. Improved previously new pulmonary opacities in the right lower lobe. No pleural effusion. No significant change in size of pulmonary and pleural metastases. Previously measured metastases as follows:    Right upper lobe, 2.3 cm (series 2 image 13) previously 2.3 cm.  Right lower lobe, 0.9 cm (series 2 image 38) previously 0.9 cm.    Chest Wall and Osseous Structures: No destructive osseous lesion.     Abdomen and Pelvis Findings:    Limited evaluation without the use of IV contrast which includes the viscera and vasculature.    Liver and Biliary system: Liver is normal in size. Persistent scalloping of the hepatic margins from peritoneal metastases. Unchanged right hepatic cyst. No  obvious new parenchymal hepatic mass on noncontrast CT. Indwelling biliary stents. Persistent mild intrahepatic biliary ductal dilatation and pneumobilia.     Spleen: Splenectomy.    Adrenal Glands and Kidneys: Adrenal glands unremarkable. Several subcentimeter renal calculi. No hydronephrosis.    Pancreas and Retroperitoneum: Unopacified pancreas is partially obscured due to extensive peritoneal metastases. No significant change in small indeterminate retroperitoneal lymph nodes. No obvious new lymphadenopathy.    Aorta and Major Vessels: Normal caliber abdominal aorta. Mild calcified atherosclerotic plaque.     Bowel, Mesentery and Peritoneal space: Distal colonic anastomosis and right lower quadrant ostomy. Omentectomy. No bowel obstruction. Grossly unchanged extensive peritoneal metastases, many of which are partially calcified.    Pelvis: Hysterectomy. Persistent gas distention of the vagina with trace layering fluid. Urinary bladder is incompletely distended and not well evaluated. No discrete pelvic lymphadenopathy.    Abdominal wall and Osseous Structures: Midline ventral abdominal incision scar. Grossly unchanged right anterior abdominal wall metastases. Previously measured subcutaneous right ventral abdominal wall metastasis measures 2.1 x 1.6 cm (series 2 image 139) previously 2.1 x 1.6 cm.    Unchanged mild superior endplate compression deformity at L1. Development of mild superior endplate compression deformity at L3. Grossly unchanged asymmetric marrow in the left sacrum (series 2 image 174).  Impression: CHEST:  1. No significant change in small indeterminate anterior diaphragmatic and paraesophageal lymph nodes. No new thoracic lymphadenopathy.    2. No significant change in pulmonary and pleural metastases.    ABDOMEN AND PELVIS:  1. Grossly unchanged extensive peritoneal carcinomatosis and right abdominal wall metastases.    2. No new abdominopelvic lymphadenopathy.    3. Development of mild superior endplate compression deformity at L3. Unchanged mild superior endplate compression deformity at L1.    4. Unchanged asymmetric marrow in the left sacrum, nonspecific and incompletely characterized on this exam but may be from osseous metastasis.     Finalized by Ancil Boozer, M.D. on 03/30/2022 12:44 PM. Dictated by Ancil Boozer, M.D. on 03/30/2022 12:14 PM.  GI ENDO CATH BIL & PANC DUCT  This order has been auto finalized and does not contain a result.        ASSESSMENT AND PLAN:    1. Metastatic appendiceal carcinoma. Signet ring adenocarcinoma, MSS, with peritoneal carcinomatosis with  pseudomyxoma peritonei.  02/19/17 diagnostic laparoscopy with biopsies of the right diaphragm, right liver, right fallopian tube and left pelvis.  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  Planned for exploratory laparotomy with CRS/HIPEC in combination with a TAH/BSO in 03/2017.  Developed intestinal obstruction and taken to OR for CRS/HIPEC surgery; however multiple adhesions, omental cracking. CRS/HIPEC was aborted.  Immunotherapy on epacadostat + sirolimus trial 12/24/17-05/21/18.  She stopped study drug on 05/21/18 for mitomycin HIPEC trial at [facility] [place].  On 05/28/18 diagnostic laparoscopy, evacuation of mucinous ascites 1.5L, peritoneal biopsy, laparoscopic HIPEC with mitomycin-C in [place] by Dr. Daryl Eastern.  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  Completed second laparoscopic HIPEC on 06/25/18.  Again 1.5L removed.  09/12/18 underwent debulking at [facility] in [place], [place].  Removed 98% by Dr. [name].  Discharged on 09/30/18.  She had L pleural effusion requiring chest tube drainage; intra-abd abscess 10/2018; discharged on 11/04/18 with drain and IV cefepime and Diflucan. Her ureteral stents were removed and ostomy relocated in 10/2018.  Hospitalized in 01/2019 for AKI 2/2 dehydration and likely PE (V/Q scan and trop elevation). CEA increasing again. Repeat CT CAP 03/20/19 with stable to slight increase  in cancer and RUL nodule.  Discussed observation vs treatment. She chose observation and is considering repeat CRS + HIPEC via Dr. [name] in [place].  CT scans from 09/24/19 of the abdomen, without contrast, appear stable.  The peritoneal thickening, carcinomatosis, retroperitoneal lymph nodes, a small amount of fluid collection or trace ascites, appear to be stable compared to 3 months ago. 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  CT scan on 08/17/20 showed a right lung mass that is slightly increased from 11 mm to 14 mm. There are some new lung nodules that have developed, a small one in the left lung, a new nodule in the right lung. Inside the abdomen we are seeing continued increase in size of the peritoneal nodules. The largest measuring 14 mm compared to 12 mm.  The nodule lower in the pelvis increased from 16 mm to 21 mm.  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  TMB = 1.3.  S/p CRS w/ ostomy revision and tumor removal around stoma on 10/14/20.  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 [name] PA from [facility] in [place] called to relay their TB recommendations for patient.  Recommendations to proceed with FOLFIRI/Avastin with holding Avastin 6-8 weeks post-op. They also request if our office can repeat genomic testing.  Repeat TEMPUS from 10/14/20.  Pre-screen for KRAS G12D inhibitor trial, if not eligible will plan for FOLFIRI +/- bevacizumab. Tolerated C1 FOLFIRI but more diarrhea and N/V after C2. Decreased irinotecan to 130 mg/m2 due to diarrhea. S/p C4, with bevacizumab added on. CT scan 02/16/21 abdominal wall spots are under control and stable. There is a slight increase in size of the lung nodules. The radiologist is measuring 10 mm to 8 mm in 11/2020. The other right lung nodule is more or less the same at 15 mm. CT scans with stable disease, mild increase in size of pulmonary metastases (specifically the right upper lobe nodule 2.2cm, previously 1.5cm). There is also some increasing nodular and tree-in-bud opacities in the lingula and development of mild focal consolidation in the left lower lobe, indeterminate but may be related to recent COVID infection vs pulmonary metastases. Given overall stability and down-trending CEA, we will plan to continue with the current treatment regimen for now. Dr. Dorita Fray with Radiation Oncology reviewed imaging (already received 50Gy to right lung 3/1-05/25/20) and will continue to monitor. CT scan on 08/03/2021 shows the cancer inside the abdomen and the abdominal wall metastasis is remaining the same compared to the scans done in 04/2021. The lung metastasis appears slightly larger by about 2 mm. Abnormal labs, even on repeat. Chemo held. Ultrasound reveals moderate ductal dilatation. Refer to GI for ERCP. Continued elevation of liver enzymes and bilirubin. MRI MRCP performed on 09/07/2021 shows extrinsic compression of the common bile duct causing biliary dilatation, as well as both intrahepatic ducts on the right and left side. CT scan 09/28/2021 shows the cancer was overall stable. However, the fact that it is causing biliary obstruction, ureter obstruction, and hydronephrosis tells [place] that clinically it is growing. There was mild hydronephrosis on both kidneys. cycle 18 of FOLFIRI,  stent replacement in 3 weeks, will hold bevacizumab. In October, re-discuss decreasing her Irinotecan to every 4 weeks if no worsening of labs and/or symptoms. CT scan on 12/12/2021 shows the peritoneal mass is stable. The dilation of the bile ducts has improved. The nodules in the  lung are stable. There are new cluster of nodules which appear like inflammation. CT scan of the chest, abdomen, and pelvis performed on 03/30/2022, when compared shows that her lung nodules, abdominal wall, tumor in the abdomen, and the peritoneal carcinomatosis appears the same.      Plan 05/10/22:  Tolerating treatment well, proceed with cycle 26 today, 05/10/22.  Add 2 gms of magnesium and IV fluids with chemo and bev  C27 in 2 weeks with labs and treatment, no visit.   RTC in 4 weeks with labs and visit, then treatment C28  Repeat imaging in 10 weeks, then see Dr. Josiah Lobo for review.   Continue IVF and IV mag as needed.   Follow up with nephrology and urology as needed.   Continue supportive measures. Pt to call with any questions, concerns or symptoms at any time and we can see her sooner.                  Gastrointestinal issues: Patient reports occasional heartburn and is currently on Nexium. Continue Nexium and provide prescription for antacid mixture (Maalox, liquid Benadryl, lidocaine viscous) as needed.     2. Elevated creatinine. s/p ureteral stent. Her ureteral stents were removed 10/2018. Encouraged hydration. S/p IVF 1L NS and 1g Mg weekly x 4 weeks. Referred to Nephrology as Cr is improving with IVF. She is following with Dr. Marjie Skiff in Nephrology. Cr 2.48 after GI virus. Decreased irinotecan to 130 mg/m2. Continue IV fluids 3 times a week locally, as we anticipate more diarrhea with irinotecan. Creatinine was greater than 4 with infection. Cr 2.09 today. Has seen urology for chronic infections. Is going on chronic antibiotics to control. Now on 250 cipro every 3rd day. Continue IVF closer to home. 2.33    3. Diarrhea. Better with Lomotil daily. Continue IV fluids 3 times a week locally. Increased her Lomotil to 2 tablets 4 times daily. She will continue to take Metamucil to bulk up her stools. She had a day of decreased stool output but was able to flush the Metamucil out with an enema. Bowels are back to baseline now. Continue to take Imodium as needed, okay to increase to 2 tabs at a time as needed. Better with advanced diet. Obstruction with too much fiber. Better with break in chemo. Monitor. IVF as needed throughout week. Improved. Continue Imodium and Lomotil as needed.     5. Hypomagnesemia. Due to large volume fluid loss. 1.2 today. Replace in treatment 2 gms. She is on oral supplements at home Mag oxide as well     6. Urinary tract infection. Now managed by urology. On suppressive antibiotics cipro 250 mg every 3 days. Follow up with her urologist. Minimal increase in symptoms. Follow up with urology    7. Elevated LFTs. May be irinotecan, tylenol, cipro. Hold tylenol. Continue antibiotics. Improved after holding tylenol. Now elevated again with bilirubin 2.7, AST 66 and ALT 106. Some improvement after IVF but bilirubin still elevated at 2.3. ultrasound of abdomen to assess for biliary obstruction/dilation. This revealed moderate intrahepatic bile duct dilatation. 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 Refer to GI for possible ERCP. Stent placed. Now bili higher at 4.1 and LFTs higher. White count elevated. Admitted to hospital for repeat ERCP. They were able to extend stent, no PTC. Bilirubin now 1.4. ok to resume treatment. Repeat scope 9/7. WNL today. Replace beginning of January 2024     8. Kidney dysfunction. Persistent but improved today. Continue  IVF as needed and stool control. Continue to monitor.      Turner Daniels, APRN-NP          The patient and family were allowed to ask questions and voice concerns; these were addressed to the best of our ability. They expressed understanding of what was explained to them, and they agreed with the present plan. Patient has the phone numbers for the Cancer Center and was instructed on how to contact us with any questions or concerns. My collaborating physician on this case is Dr. Josiah Lobo.

## 2022-05-10 ENCOUNTER — Encounter: Admit: 2022-05-10 | Discharge: 2022-05-10 | Payer: MEDICARE

## 2022-05-10 DIAGNOSIS — C181 Malignant neoplasm of appendix: Secondary | ICD-10-CM

## 2022-05-10 DIAGNOSIS — C786 Secondary malignant neoplasm of retroperitoneum and peritoneum: Secondary | ICD-10-CM

## 2022-05-10 DIAGNOSIS — C801 Malignant (primary) neoplasm, unspecified: Secondary | ICD-10-CM

## 2022-05-10 DIAGNOSIS — C189 Malignant neoplasm of colon, unspecified: Secondary | ICD-10-CM

## 2022-05-10 DIAGNOSIS — K219 Gastro-esophageal reflux disease without esophagitis: Secondary | ICD-10-CM

## 2022-05-10 DIAGNOSIS — I454 Nonspecific intraventricular block: Secondary | ICD-10-CM

## 2022-05-10 LAB — CBC AND DIFF
ABSOLUTE BASO COUNT: 0 K/UL (ref 0–0.20)
ABSOLUTE EOS COUNT: 0.3 K/UL (ref 0–0.45)
ABSOLUTE LYMPH COUNT: 1.3 K/UL (ref 1.0–4.8)
ABSOLUTE MONO COUNT: 0.9 K/UL — ABNORMAL HIGH (ref 0–0.80)
ABSOLUTE NEUTROPHIL: 5.1 K/UL (ref 1.8–7.0)
BASOPHILS %: 0 % (ref 0–2)
EOSINOPHILS %: 4 % — ABNORMAL LOW (ref >60)
HEMATOCRIT: 25 % — ABNORMAL LOW (ref 36–45)
LYMPHOCYTES %: 17 % — ABNORMAL LOW (ref 24–44)
MCH: 35 pg — ABNORMAL HIGH (ref 26–34)
MONOCYTES %: 12 % (ref 4–12)
MPV: 8.8 FL (ref 7–11)
NEUTROPHILS %: 67 % (ref 41–77)
PLATELET COUNT: 262 K/UL — ABNORMAL HIGH (ref 150–400)
RBC COUNT: 2.4 M/UL — ABNORMAL LOW (ref 4.0–5.0)
RDW: 15 % (ref 11–15)
WBC COUNT: 7.6 K/UL (ref 4.5–11.0)

## 2022-05-10 LAB — COMPREHENSIVE METABOLIC PANEL
POTASSIUM: 3.6 MMOL/L (ref 3.5–5.1)
SODIUM: 138 MMOL/L (ref 137–147)

## 2022-05-10 LAB — CEA(CARCINOEMBRYONIC AG): CEA: 101 ng/mL — ABNORMAL HIGH (ref <3.0)

## 2022-05-10 LAB — MAGNESIUM: MAGNESIUM: 1.2 mg/dL — ABNORMAL LOW (ref 1.6–2.6)

## 2022-05-10 LAB — CA19.9: CA 19-9: <1 U/mL — ABNORMAL LOW (ref <35)

## 2022-05-10 MED ORDER — MAGNESIUM SULFATE IN D5W 1 GRAM/100 ML IV PGBK (CC ONLY)
1 g | INTRAVENOUS | 0 refills | Status: CP
Start: 2022-05-10 — End: ?
  Administered 2022-05-10 (×2): 1 g via INTRAVENOUS

## 2022-05-10 MED ORDER — ATROPINE 0.4 MG/ML IJ SOLN
.4 mg | INTRAVENOUS | 0 refills | Status: AC | PRN
Start: 2022-05-10 — End: ?
  Administered 2022-05-10: 17:00:00 0.4 mg via INTRAVENOUS

## 2022-05-10 MED ORDER — RXAMB APAP-MAALOX-LIDOCAINE 1:3:1 SUSPENSION (MIXTURE COMPOUND)
30 mL | ORAL | 0 refills | 30.00000 days | Status: DC | PRN
Start: 2022-05-10 — End: 2022-05-10
  Filled 2022-05-10: qty 60, 2d supply, fill #1

## 2022-05-10 MED ORDER — BEVACIZUMAB-AWWB IVPB
5 mg/kg | Freq: Once | INTRAVENOUS | 0 refills | Status: CP
Start: 2022-05-10 — End: ?
  Administered 2022-05-10 (×2): 260.5 mg via INTRAVENOUS

## 2022-05-10 MED ORDER — RXAMB APAP-MAALOX-LIDOCAINE 1:3:1 SUSPENSION (MIXTURE COMPOUND)
30 mL | ORAL | 0 refills | 30.00000 days | Status: AC | PRN
Start: 2022-05-10 — End: ?

## 2022-05-10 MED ORDER — DEXAMETHASONE 4 MG PO TAB
4 mg | Freq: Once | ORAL | 0 refills | Status: CP
Start: 2022-05-10 — End: ?
  Administered 2022-05-10: 16:00:00 4 mg via ORAL

## 2022-05-10 MED ORDER — PALONOSETRON 0.25 MG/5 ML IV SOLN
.25 mg | Freq: Once | INTRAVENOUS | 0 refills | Status: CP
Start: 2022-05-10 — End: ?
  Administered 2022-05-10: 16:00:00 0.25 mg via INTRAVENOUS

## 2022-05-10 MED ORDER — FLUOROURACIL IV AMB PUMP
2400 mg/m2 | Freq: Once | INTRAVENOUS | 0 refills | Status: CP
Start: 2022-05-10 — End: ?
  Administered 2022-05-10 (×4): 3624 mg via INTRAVENOUS

## 2022-05-10 MED ORDER — IRINOTECAN IVPB
110 mg/m2 | Freq: Once | INTRAVENOUS | 0 refills | Status: CP
Start: 2022-05-10 — End: ?
  Administered 2022-05-10 (×3): 166.2 mg via INTRAVENOUS

## 2022-05-10 MED ORDER — SODIUM CHLORIDE 0.9 % IV SOLP
1000 mL | Freq: Once | INTRAVENOUS | 0 refills | Status: CP
Start: 2022-05-10 — End: ?
  Administered 2022-05-10: 16:00:00 1000 mL via INTRAVENOUS

## 2022-05-10 NOTE — Patient Instructions
Call Immediately to report the following:  Unexplained bleeding or bleeding that will not stop  Difficulty swallowing  Shortness of breath, wheezing, or trouble breathing  Rapid, irregular heartbeat; chest pain  Dizziness, lightheadedness  Rash or cut that swells or turns red, feels hot or painful, or begin to ooze  Diarrhea   Uncontrolled nausea or vomiting  Fever of 100.4 F or higher, or chills    Important Phone Numbers:  Cancer Center Main Number (answered 24 hours a day) [phone]  Cancer Center Scheduling (appointments) [phone]  Social Worker [phone]  Nutritionist [phone]

## 2022-05-10 NOTE — Progress Notes
CHEMO NOTE  Verified chemo consent signed and in chart.    Blood return positive via: Port (Single)    BSA and dose double checked (agree with orders as written) with: yes rn    Labs/applicable tests checked: CBC, Comprehensive Metabolic Panel (CMP), and blood pressure    Chemo regimen: Drug/cycle/day [id number]    bevacizumab-awwb (MVASI) 260.5 mg in sodium chloride 0.9% (NS) 110.42 mL IVPB   irinotecan (CAMPTOSAR) 166.2 mg in dextrose 5% (D5W) 508.31 mL IVPB   fluorouraciL (ADRUCIL) 3,624 mg in sodium chloride 0.9% (NS) 92 mL IV amb pump    Rate verified and armband double check with second RN: yes    Patient education offered and stated understanding. Denies questions at this time.    Patient arrived to [place] treatment after being seen in clinic; please refer to clinic note for assessment details. Premeds, magnesium and IV fluids given per treatment plan. Mvasi and irinotecan given w/o complications, patient tolerated well. 38f currently infusing via ambulatory pump. All connections secure and line unclamped. All questions and concerns addressed. Patient left [facility] treatment in stable condition.

## 2022-05-17 ENCOUNTER — Encounter: Admit: 2022-05-17 | Discharge: 2022-05-17 | Payer: MEDICARE

## 2022-05-24 ENCOUNTER — Encounter: Admit: 2022-05-24 | Discharge: 2022-05-24 | Payer: MEDICARE

## 2022-05-24 ENCOUNTER — Ambulatory Visit: Admit: 2022-05-24 | Discharge: 2022-05-25 | Payer: MEDICARE

## 2022-05-24 DIAGNOSIS — C189 Malignant neoplasm of colon, unspecified: Secondary | ICD-10-CM

## 2022-05-24 DIAGNOSIS — K219 Gastro-esophageal reflux disease without esophagitis: Secondary | ICD-10-CM

## 2022-05-24 DIAGNOSIS — I454 Nonspecific intraventricular block: Secondary | ICD-10-CM

## 2022-05-24 DIAGNOSIS — N183 Stage 3 chronic kidney disease, unspecified whether stage 3a or 3b CKD (HCC): Secondary | ICD-10-CM

## 2022-05-24 DIAGNOSIS — C181 Malignant neoplasm of appendix: Secondary | ICD-10-CM

## 2022-05-24 DIAGNOSIS — C786 Secondary malignant neoplasm of retroperitoneum and peritoneum: Secondary | ICD-10-CM

## 2022-05-24 DIAGNOSIS — R82998 Other abnormal findings in urine: Secondary | ICD-10-CM

## 2022-05-24 DIAGNOSIS — C801 Malignant (primary) neoplasm, unspecified: Secondary | ICD-10-CM

## 2022-05-24 LAB — CBC AND DIFF
ABSOLUTE NEUTROPHIL COUNT MANUAL: 3 K/UL (ref 1.8–7.0)
EOSINOPHIL %: 2 % — ABNORMAL LOW (ref >60)
HEMATOCRIT: 27 % — ABNORMAL LOW (ref 36–45)
LYMPHOCYTES: 44 % — ABNORMAL HIGH (ref 24–44)
MCH: 35 pg — ABNORMAL HIGH (ref 26–34)
MONOCYTES %: 19 % — ABNORMAL HIGH (ref 4–12)
MPV: 7.6 FL — ABNORMAL HIGH (ref 7–11)
MYELOCYTES: 1 %
NEUTROPHILS,SEG: 34 % — ABNORMAL LOW (ref 41–77)
PLATELET COUNT: 450 K/UL — ABNORMAL HIGH (ref 150–400)
RBC COUNT: 2.5 M/UL — ABNORMAL LOW (ref 4.0–5.0)
RDW: 15 % — ABNORMAL HIGH (ref 11–15)
WBC COUNT: 8.8 K/UL (ref 4.5–11.0)

## 2022-05-24 LAB — SODIUM-URINE RANDOM: UR SODIUM, RAN: 53 MMOL/L

## 2022-05-24 LAB — COMPREHENSIVE METABOLIC PANEL
POTASSIUM: 3.9 MMOL/L (ref 3.5–5.1)
SODIUM: 139 MMOL/L (ref 137–147)

## 2022-05-24 LAB — MAGNESIUM: MAGNESIUM: 1.2 mg/dL — ABNORMAL LOW (ref 1.6–2.6)

## 2022-05-24 LAB — PROTEIN/CR RATIO,UR RAN
PROT CREAT RAT/CAL: 0.6 mg/dL — ABNORMAL HIGH (ref <0.15)
UR CREATININE, RAN: 102 mg/dL — ABNORMAL LOW (ref >40)
UR TOTAL PROTEIN,RAN: 66 mg/dL (ref <150)

## 2022-05-24 LAB — CEA(CARCINOEMBRYONIC AG): CEA: 107 ng/mL — ABNORMAL HIGH (ref <3.0)

## 2022-05-24 LAB — CA19.9: CA 19-9: 6 U/mL — ABNORMAL LOW (ref <35)

## 2022-05-24 MED ORDER — MAGNESIUM SULFATE IN D5W 1 GRAM/100 ML IV PGBK (CC ONLY)
1 g | Freq: Once | INTRAVENOUS | 0 refills | Status: CP
Start: 2022-05-24 — End: ?
  Administered 2022-05-24: 15:00:00 1 g via INTRAVENOUS

## 2022-05-24 MED ORDER — FLUOROURACIL IV AMB PUMP
2400 mg/m2 | Freq: Once | INTRAVENOUS | 0 refills | Status: CN
Start: 2022-05-24 — End: ?

## 2022-05-24 MED ORDER — PALONOSETRON 0.25 MG/5 ML IV SOLN
.25 mg | Freq: Once | INTRAVENOUS | 0 refills | Status: CN
Start: 2022-05-24 — End: ?

## 2022-05-24 MED ORDER — DEXAMETHASONE 4 MG PO TAB
4 mg | Freq: Once | ORAL | 0 refills | Status: CP
Start: 2022-05-24 — End: ?
  Administered 2022-05-24: 15:00:00 4 mg via ORAL

## 2022-05-24 MED ORDER — SODIUM CHLORIDE 0.9 % IV SOLP
1000 mL | Freq: Once | INTRAVENOUS | 0 refills | Status: CP
Start: 2022-05-24 — End: ?
  Administered 2022-05-24: 15:00:00 1000 mL via INTRAVENOUS

## 2022-05-24 MED ORDER — IRINOTECAN IVPB
110 mg/m2 | Freq: Once | INTRAVENOUS | 0 refills | Status: CP
Start: 2022-05-24 — End: ?
  Administered 2022-05-24 (×3): 166.2 mg via INTRAVENOUS

## 2022-05-24 MED ORDER — ATROPINE 0.4 MG/ML IJ SOLN
0.4 mg | INTRAVENOUS | 0 refills | Status: CN | PRN
Start: 2022-05-24 — End: ?

## 2022-05-24 MED ORDER — ATROPINE 0.4 MG/ML IJ SOLN
.4 mg | INTRAVENOUS | 0 refills | Status: AC | PRN
Start: 2022-05-24 — End: ?
  Administered 2022-05-24: 16:00:00 0.4 mg via INTRAVENOUS

## 2022-05-24 MED ORDER — DEXAMETHASONE 4 MG PO TAB
4 mg | Freq: Once | ORAL | 0 refills | Status: CN
Start: 2022-05-24 — End: ?

## 2022-05-24 MED ORDER — BEVACIZUMAB-AWWB IVPB
5 mg/kg | Freq: Once | INTRAVENOUS | 0 refills | Status: CP
Start: 2022-05-24 — End: ?
  Administered 2022-05-24 (×2): 260.5 mg via INTRAVENOUS

## 2022-05-24 MED ORDER — PALONOSETRON 0.25 MG/5 ML IV SOLN
.25 mg | Freq: Once | INTRAVENOUS | 0 refills | Status: CP
Start: 2022-05-24 — End: ?
  Administered 2022-05-24: 15:00:00 0.25 mg via INTRAVENOUS

## 2022-05-24 MED ORDER — FLUOROURACIL IV AMB PUMP
2400 mg/m2 | Freq: Once | INTRAVENOUS | 0 refills | Status: CP
Start: 2022-05-24 — End: ?
  Administered 2022-05-24 (×4): 3624 mg via INTRAVENOUS

## 2022-05-24 NOTE — Progress Notes
CHEMO NOTE  Verified chemo consent signed and in chart.    Blood return positive via: Port (Single)    BSA and dose double checked (agree with orders as written) with: yes      Labs/applicable tests checked: CBC and Comprehensive Metabolic Panel (CMP)    Chemo regimen: Drug/cycle/day [id number] MVASI/ Irinotecan/ Adrucil Pump    Rate verified and armband double check with second RN: yes    Patient education offered and stated understanding. Denies questions at this time.      Patient received Magnesium and fluid infusion. Patient tolerated without complications.   Patient educated on pump use and verbalized understanding. Patient pump attached and infusing. Patient home with pump.

## 2022-05-24 NOTE — Progress Notes
Rec'd results. Provided to Dr. Johnn Hai.

## 2022-05-24 NOTE — Progress Notes
CHEMO NOTE  Verified chemo consent signed and in chart.    Blood return positive via: Port (Single)    BSA and dose double checked (agree with orders as written) with: yes     Labs/applicable tests checked: CBC and Comprehensive Metabolic Panel (CMP)    Chemo regimen: Drug/cycle/day [id number] Bevacizumab/Folfiri    Rate verified and armband double check with second RN: yes    Patient education offered and stated understanding. Denies questions at this time.    Patient educated on pump use and verbalized understanding. Patient pump attached and infusing. Patient home with pump.     Reviewed and approved by N. Pistone, RN

## 2022-05-24 NOTE — Progress Notes
RN called Dr. Rosario Adie office with [facility] urology in [place], [place] as pt. Reports had recent UA. Awaiting fax with results from 2/20 and 2/22.

## 2022-05-25 ENCOUNTER — Encounter: Admit: 2022-05-25 | Discharge: 2022-05-25 | Payer: MEDICARE

## 2022-05-25 ENCOUNTER — Ambulatory Visit: Admit: 2022-05-25 | Discharge: 2022-05-25 | Payer: MEDICARE

## 2022-05-25 DIAGNOSIS — C181 Malignant neoplasm of appendix: Secondary | ICD-10-CM

## 2022-05-25 DIAGNOSIS — Z4659 Encounter for fitting and adjustment of other gastrointestinal appliance and device: Secondary | ICD-10-CM

## 2022-05-25 DIAGNOSIS — K831 Obstruction of bile duct: Secondary | ICD-10-CM

## 2022-05-25 DIAGNOSIS — Z9889 Other specified postprocedural states: Secondary | ICD-10-CM

## 2022-05-26 ENCOUNTER — Encounter: Admit: 2022-05-26 | Discharge: 2022-05-26 | Payer: MEDICARE

## 2022-05-26 NOTE — Progress Notes
Faxed UA culture results to Dr. Rosario Adie, Urologist at [phone] through [place].

## 2022-06-04 ENCOUNTER — Encounter: Admit: 2022-06-04 | Discharge: 2022-06-04 | Payer: MEDICARE

## 2022-06-04 MED ORDER — PALONOSETRON 0.25 MG/5 ML IV SOLN
.25 mg | Freq: Once | INTRAVENOUS | 0 refills
Start: 2022-06-04 — End: ?

## 2022-06-04 MED ORDER — DEXAMETHASONE 4 MG PO TAB
4 mg | Freq: Once | ORAL | 0 refills
Start: 2022-06-04 — End: ?

## 2022-06-04 MED ORDER — ATROPINE 0.4 MG/ML IJ SOLN
0.4 mg | INTRAVENOUS | 0 refills | PRN
Start: 2022-06-04 — End: ?

## 2022-06-04 MED ORDER — FLUOROURACIL IV AMB PUMP
2400 mg/m2 | Freq: Once | INTRAVENOUS | 0 refills
Start: 2022-06-04 — End: ?

## 2022-06-07 ENCOUNTER — Encounter: Admit: 2022-06-07 | Discharge: 2022-06-07 | Payer: MEDICARE

## 2022-06-07 DIAGNOSIS — C786 Secondary malignant neoplasm of retroperitoneum and peritoneum: Secondary | ICD-10-CM

## 2022-06-07 DIAGNOSIS — I454 Nonspecific intraventricular block: Secondary | ICD-10-CM

## 2022-06-07 DIAGNOSIS — C181 Malignant neoplasm of appendix: Secondary | ICD-10-CM

## 2022-06-07 DIAGNOSIS — C189 Malignant neoplasm of colon, unspecified: Secondary | ICD-10-CM

## 2022-06-07 DIAGNOSIS — K219 Gastro-esophageal reflux disease without esophagitis: Secondary | ICD-10-CM

## 2022-06-07 DIAGNOSIS — C801 Malignant (primary) neoplasm, unspecified: Secondary | ICD-10-CM

## 2022-06-07 LAB — MAGNESIUM: MAGNESIUM: 1.6 mg/dL (ref 1.6–2.6)

## 2022-06-07 LAB — CA19.9: CA 19-9: <1 U/mL — ABNORMAL LOW (ref <35)

## 2022-06-07 LAB — CBC AND DIFF
ABSOLUTE BASO COUNT: 0 K/UL (ref 0–0.20)
ABSOLUTE EOS COUNT: 0.1 K/UL (ref 0–0.45)
ABSOLUTE LYMPH COUNT: 2.3 K/UL (ref 1.0–4.8)
ABSOLUTE MONO COUNT: 2.3 K/UL — ABNORMAL HIGH (ref 0–0.80)
ABSOLUTE NEUTROPHIL: 2.7 K/UL (ref 1.8–7.0)
BASOPHILS %: 1 % (ref 0–2)
EOSINOPHILS %: 2 % — ABNORMAL LOW (ref >60)
HEMATOCRIT: 22 % — ABNORMAL LOW (ref 36–45)
LYMPHOCYTES %: 31 % (ref 24–44)
MCH: 36 pg — ABNORMAL HIGH (ref 26–34)
MONOCYTES %: 30 % — ABNORMAL HIGH (ref 4–12)
MPV: 7.4 FL (ref 7–11)
NEUTROPHILS %: 36 % — ABNORMAL LOW (ref 41–77)
PLATELET COUNT: 450 K/UL — ABNORMAL HIGH (ref 150–400)
RBC COUNT: 2 M/UL — ABNORMAL LOW (ref 4.0–5.0)
RDW: 16 % — ABNORMAL HIGH (ref 11–15)
WBC COUNT: 7.5 K/UL (ref 4.5–11.0)

## 2022-06-07 LAB — CEA(CARCINOEMBRYONIC AG): CEA: 103 ng/mL — ABNORMAL HIGH (ref <3.0)

## 2022-06-07 LAB — COMPREHENSIVE METABOLIC PANEL
POTASSIUM: 4.1 MMOL/L (ref 3.5–5.1)
SODIUM: 135 MMOL/L — ABNORMAL LOW (ref 137–147)

## 2022-06-07 MED ORDER — ATROPINE 0.4 MG/ML IJ SOLN
0.4 mg | INTRAVENOUS | 0 refills | PRN
Start: 2022-06-07 — End: ?

## 2022-06-07 MED ORDER — FLUOROURACIL IV AMB PUMP
2400 mg/m2 | Freq: Once | INTRAVENOUS | 0 refills | Status: CN
Start: 2022-06-07 — End: ?

## 2022-06-07 MED ORDER — FLUOROURACIL IV AMB PUMP
2400 mg/m2 | Freq: Once | INTRAVENOUS | 0 refills | Status: CP
Start: 2022-06-07 — End: ?
  Administered 2022-06-07 (×4): 3624 mg via INTRAVENOUS

## 2022-06-07 MED ORDER — DEXAMETHASONE 4 MG PO TAB
4 mg | Freq: Once | ORAL | 0 refills | Status: CP
Start: 2022-06-07 — End: ?
  Administered 2022-06-07: 17:00:00 4 mg via ORAL

## 2022-06-07 MED ORDER — MAGNESIUM SULFATE IN D5W 1 GRAM/100 ML IV PGBK (CC ONLY)
1 g | Freq: Once | INTRAVENOUS | 0 refills | Status: CP
Start: 2022-06-07 — End: ?
  Administered 2022-06-07: 17:00:00 1 g via INTRAVENOUS

## 2022-06-07 MED ORDER — SODIUM CHLORIDE 0.9 % IV SOLP
1000 mL | Freq: Once | INTRAVENOUS | 0 refills | Status: CP
Start: 2022-06-07 — End: ?
  Administered 2022-06-07: 17:00:00 1000 mL via INTRAVENOUS

## 2022-06-07 MED ORDER — PALONOSETRON 0.25 MG/5 ML IV SOLN
.25 mg | Freq: Once | INTRAVENOUS | 0 refills | Status: CP
Start: 2022-06-07 — End: ?
  Administered 2022-06-07: 17:00:00 0.25 mg via INTRAVENOUS

## 2022-06-07 MED ORDER — DEXAMETHASONE 4 MG PO TAB
4 mg | Freq: Once | ORAL | 0 refills
Start: 2022-06-07 — End: ?

## 2022-06-07 MED ORDER — FLUOROURACIL IV AMB PUMP
2400 mg/m2 | Freq: Once | INTRAVENOUS | 0 refills
Start: 2022-06-07 — End: ?

## 2022-06-07 MED ORDER — PALONOSETRON 0.25 MG/5 ML IV SOLN
.25 mg | Freq: Once | INTRAVENOUS | 0 refills
Start: 2022-06-07 — End: ?

## 2022-06-07 MED ORDER — BEVACIZUMAB-AWWB IVPB
5 mg/kg | Freq: Once | INTRAVENOUS | 0 refills | Status: CP
Start: 2022-06-07 — End: ?
  Administered 2022-06-07 (×2): 260.5 mg via INTRAVENOUS

## 2022-06-07 MED FILL — RXAMB APAP-MAALOX-LIDOCAINE 1:3:1 SUSPENSION (MIXTURE COMPOUND): ORAL | 2 days supply | Qty: 60 | Fill #2 | Status: CP

## 2022-06-07 NOTE — Progress Notes
CHEMO NOTE  Verified chemo consent signed and in chart.    Blood return positive via: Port (Single)    BSA and dose double checked (agree with orders as written) with: yes rn    Labs/applicable tests checked: CBC, Comprehensive Metabolic Panel (CMP), and blood pressure    Chemo regimen: Drug/cycle/day C28 D1    bevacizumab-awwb (MVASI) 260.5 mg in sodium chloride 0.9% (NS) 110.42 mL IVPB  fluorouraciL (ADRUCIL) 3,624 mg in sodium chloride 0.9% (NS) 92 mL IV amb pump     Rate verified and armband double check with second RN: yes    Patient education offered and stated understanding. Denies questions at this time.    Patient arrived to [place] treatment after being seen in clinic; please refer to clinic note for assessment details. Premeds, IV fluids and magnesium given per treatment plan. Avastin given w/o complications, patient tolerated well.  82fu currently infusing via ambulatory pump. All connections secure and line unclamped. All questions and concerns addressed. Patient left [facility] treatment in stable condition.

## 2022-06-07 NOTE — Progress Notes
Date of Service: 06/07/2022    SUBJECTIVE:             Reason for Visit:  Follow Up    Vicki Buchanan is a 67 y.o. 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  02/19/17 diagnostic laparoscopy with biopsies of the right diaphragm, right liver, right fallopian tube and left pelvis.  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  Planned for exploratory laparotomy with CRS/HIPEC in combination with a TAH/BSO in 03/2017.  Developed intestinal obstruction and taken to OR for CRS/HIPEC surgery; however multiple adhesions, omental cracking. CRS/HIPEC was aborted.  Immunotherapy on epacadostat + sirolimus trial 12/24/17-05/21/18.  She stopped study drug on 05/21/18 for mitomycin HIPEC trial at [facility] [place].  On 05/28/18 diagnostic laparoscopy, evacuation of mucinous ascites 1.5L, peritoneal biopsy, laparoscopic HIPEC with mitomycin-C in [place] by Dr. Daryl Eastern.  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  Completed second laparoscopic HIPEC on 06/25/18.  Again 1.5L removed.  09/12/18 underwent debulking at [facility] in [place], [place].  Removed 98% by Dr. [name].  Discharged on 09/30/18.  She had L pleural effusion requiring chest tube drainage; intra-abd abscess 10/2018; discharged on 11/04/18 with drain and IV cefepime and Diflucan. Her ureteral stents were removed and ostomy relocated in 10/2018.  Hospitalized in 01/2019 for AKI 2/2 dehydration and likely PE (V/Q scan and trop elevation). CEA increasing again. Repeat CT CAP 03/20/19 with stable to slight increase in cancer and RUL nodule.  Discussed observation vs treatment. She chose observation and is considering repeat CRS + HIPEC via Dr. [name] in [place].  CT scans from 09/24/19 of the abdomen, without contrast, appear stable.  The peritoneal thickening, carcinomatosis, retroperitoneal lymph nodes, a small amount of fluid collection or trace ascites, appear to be stable compared to 3 months ago. 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  CT scan on 08/17/20 showed a right lung mass that is slightly increased from 11 mm to 14 mm. There are some new lung nodules that have developed, a small one in the left lung, a new nodule in the right lung. Inside the abdomen we are seeing continued increase in size of the peritoneal nodules. The largest measuring 14 mm compared to 12 mm.  The nodule lower in the pelvis increased from 16 mm to 21 mm.  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  TMB = 1.3.  S/p CRS w/ ostomy revision and tumor removal around stoma on 10/14/20.  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 [name] PA from [facility] in [place] called to relay their TB recommendations for patient.  Recommendations to proceed with FOLFIRI/Avastin with holding Avastin 6-8 weeks post-op. They also request if our office can repeat genomic testing.  Repeat TEMPUS from 10/14/20.  Pre-screen for KRAS G12D inhibitor trial, if not eligible will plan for FOLFIRI +/- bevacizumab.        Interval Hx 06/07/22:       The patient, with a history of cancer and currently on chemotherapy, presents with fatigue. She attributes this to a combination of recent travel, a viral infection a month ago, and her ongoing chemotherapy. The patient also mentions that she is on Cipro every third day for UTI suppression and has seen nephrology in consult. She has a biliary stent replacement in May. She  has not noticed any significant swelling around the stoma where the stent was placed. she had more diarrhea and felt more dehydrated. she last had IVF and magnesium on Monday. 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            Review of Systems   Constitutional:  Positive for fatigue. Negative for activity change, appetite change, chills, diaphoresis, fever and unexpected weight change.   HENT:  Negative for facial swelling, mouth sores, sore throat, trouble swallowing and voice change.    Eyes: Negative.  Negative for visual disturbance.   Respiratory: Negative.  Negative for cough, chest tightness, shortness of breath and wheezing.    Cardiovascular: Negative.  Negative for chest pain, palpitations and leg swelling.   Gastrointestinal:  Positive for diarrhea. Negative for abdominal distention, abdominal pain, anal bleeding, blood in stool, constipation, nausea, rectal pain and vomiting.   Genitourinary: Negative.  Negative for difficulty urinating, dysuria and hematuria.   Musculoskeletal: Negative.  Negative for arthralgias, back pain, neck pain and neck stiffness.   Skin:  Negative for color change, pallor, rash and wound.   Neurological:  Positive for numbness. Negative for dizziness, weakness, light-headedness and headaches.   Hematological:  Does not bruise/bleed easily.   Psychiatric/Behavioral: Negative.  Negative for agitation. The patient is not nervous/anxious.        Medical History:   Diagnosis Date    Acid reflux     Bundle branch block     Cancer (HCC)     Cancer of appendix (HCC)     Cancer of colon (HCC) 11/19     Surgical History:   Procedure Laterality Date    CESAREAN SECTION  1981    HX CHOLECYSTECTOMY  1995    LAPAROSCOPY  02/2017    biopsy peritoneal    COLONOSCOPY DIAGNOSTIC WITH SPECIMEN COLLECTION BY BRUSHING/ WASHING - FLEXIBLE N/A 03/19/2017    Performed by Everardo All, MD at [facility] ENDO    COLONOSCOPY WITH BIOPSY - FLEXIBLE  03/19/2017    Performed by Everardo All, MD at [facility] ENDO    EXPLORATORY LAPAROTOMY, DIVERTING LOOP ILEOSTOMY N/A 04/03/2017    Performed by Loralie Champagne, MD at [facility] OR    INSERTION TUNNELED CENTRAL VENOUS CATHETER - AGE 27 YEARS AND OVER Left 04/23/2017    Performed by Loralie Champagne, MD at [facility] OR    CYSTOURETHROSCOPY WITH INDWELLING URETERAL STENT INSERTION Bilateral 04/09/2018    Performed by Earl Many, MD at [facility] OR    CYSTOURETHROSCOPY WITH URETERAL CATHETERIZATION WITH/ WITHOUT IRRIGATION/ INSTILLATION/ URETEROPYELOGRAPHY Bilateral 04/09/2018    Performed by Earl Many, MD at [facility] OR    CYSTOURETHROSCOPY WITH INDWELLING URETERAL STENT EXCHANGE (RIGHT 6 x 26cm / LEFT 6 x 28cm) Bilateral 08/08/2018    Performed by Earl Many, MD at [facility] OR    RETROGRADE UROGRAPHY WITH/ WITHOUT KUB Bilateral 08/08/2018    Performed by Earl Many, MD at [facility] OR    ENDOSCOPIC RETROGRADE CHOLANGIOPANCREATOGRAPHY WITH SPHINCTEROTOMY/ PAPILLOTOMY N/A 09/16/2021    Performed by Vertell Novak, MD at [facility] ENDO    ESOPHAGOGASTRODUODENOSCOPY WITH ENDOSCOPIC ULTRASOUND EXAMINATION - FLEXIBLE  09/16/2021    Performed by Vertell Novak, MD at [facility] ENDO    ENDOSCOPIC RETROGRADE CHOLANGIOPANCREATOGRAPHY WITH PLACEMENT ENDOSCOPIC STENT INTO BILIARY/ PANCREATIC DUCT - EACH STENT  09/16/2021    Performed by Vertell Novak, MD at [facility] ENDO    COLONOSCOPY DIAGNOSTIC WITH SPECIMEN COLLECTION BY BRUSHING/ WASHING - FLEXIBLE N/A 09/26/2021  Performed by Vertell Novak, MD at [facility] OR    ENDOSCOPIC RETROGRADE CHOLANGIOPANCREATOGRAPHY WITH PLACEMENT ENDOSCOPIC STENT INTO BILIARY/ PANCREATIC DUCT - EACH STENT  09/29/2021    Performed by Eliott Nine, MD at [facility] ENDO    ENDOSCOPIC RETROGRADE CHOLANGIOPANCREATOGRAPHY with plastic stent exchange N/A 10/27/2021    Performed by Eliott Nine, MD at [facility] ENDO    ENDOSCOPIC RETROGRADE CHOLANGIOPANCREATOGRAPHY WITH ABLATION TUMOR/ POLYP/ OTHER LESION  10/27/2021    Performed by Eliott Nine, MD at [facility] ENDO    ENDOSCOPIC RETROGRADE CHOLANGIOPANCREATOGRAPHY WITH REMOVAL CALCULI/ DEBRIS FROM BILIARY/ PANCREATIC DUCT  10/27/2021    Performed by Eliott Nine, MD at [facility] ENDO    ENDOSCOPIC RETROGRADE CHOLANGIOPANCREATOGRAPHY  plastic stent replacement N/A 12/22/2021    Performed by Eliott Nine, MD at [facility] ENDO    ENDOSCOPIC RETROGRADE CHOLANGIOPANCREATOGRAPHY N/A 03/30/2022    Performed by Eliott Nine, MD at [facility] ENDO ENDOSCOPIC RETROGRADE CHOLANGIOPANCREATOGRAPHY WITH REMOVAL AND EXCHANGE OF STENT BILIARY/ PANCREATIC DUCT - EACH STENT EXCHANGED N/A 03/30/2022    Performed by Eliott Nine, MD at [facility] ENDO    COLON SURGERY  7/20    COLON SURGERY  7/20    HX APPENDECTOMY  1/10    Guess on date    HX CESAREAN SECTION      HX HYSTERECTOMY  7/20    HX LOWER ANTERIOR RESECTION OF COLON      7/20    HX SALPINGO-OOPHORECTOMY      ILEOSTOMY OR JEJUNOSTOMY Right     LIVER DONOR SURGERY  12/24/21    PORTACATH PLACEMENT  1/20    PR PNCRTECT PROX STOT W/PANCREATOJEJUNOSTOMY  7/20    TUNNELED VENOUS PORT PLACEMENT Left     URETER STENT PLACEMENT Bilateral      Family History   Problem Relation Age of Onset    Diabetes Mother     Cancer Father     Diabetes Brother     Cancer-Lung Paternal Aunt     Cancer-Breast Maternal Grandmother     Diabetes Paternal Grandmother      Social History     Socioeconomic History    Marital status: Married   Tobacco Use    Smoking status: Never    Smokeless tobacco: Never   Vaping Use    Vaping status: Never Used   Substance and Sexual Activity    Alcohol use: No     Comment: rarely    Drug use: No         OBJECTIVE:          apap-maalox-lidocaine 1:3:1 oral suspension (MIXTURE COMPOUND) Take 30 mL by mouth every 4 hours as needed.    calcium carbonate (TUMS) 500 mg (200 mg elemental calcium) chewable tablet Chew one tablet by mouth every 6 hours as needed.    carvediloL (COREG) 6.25 mg tablet Take one tablet by mouth twice daily. Take with food.  Hold if systolic blood pressure < 110    Cetirizine (ZYRTEC) 10 mg cap Take one capsule by mouth as Needed.    ciprofloxacin (CIPRO) 250 mg tablet Take one tablet by mouth every 72 hours.    dexAMETHasone (DECADRON) 4 mg tablet 1 tab by mouth with breakfast and afternoon snack on days 2 & 3 following each chemotherapy cycle. Do not take after 6 pm to avoid insomnia.    diphenoxylate-atropine (LOMOTIL) 2.5-0.025 mg tablet Take one tablet by mouth four times daily as needed for Diarrhea.    esomeprazole DR (NEXIUM) 20 mg capsule Take  one capsule by mouth daily. Take on an empty stomach at least 1 hour before or 2 hours after food.    loperamide (IMODIUM A-D) 2 mg capsule Take one pill by mouth four times daily as needed    magnesium oxide 400 mg magnesium tablet Take one tablet by mouth twice daily. (Patient taking differently: Take one tablet by mouth twice daily as needed.)    ondansetron (ZOFRAN ODT) 8 mg rapid dissolve tablet Dissolve one tablet by mouth every 8 hours as needed for Nausea or Vomiting. Place on tongue to disolve.    potassium chloride (KLOR-CON SPRINKLE) 10 mEq capsule Take one capsule by mouth twice daily. If potassium < 3.5. Take with a meal and a full glass of water.    psyllium husk (METAMUCIL) 0.4 gram cap Take one capsule by mouth daily as needed.    sod chlor,sod bicarb/neti pot (SINUS WASH NETI POT SINI) Use as directed    sodium chloride 0.9% (NS) 0.9 % 1,000 mL with potassium chloride 2 mEq/mL 20 mEq, magnesium sulfate 4 mEq/mL (50 %) 2 g IV infusion Administer 1L NS with K and 2g Mg once weekly via outpatient infusion.    traMADoL (ULTRAM) 50 mg tablet Take one tablet to two tablets by mouth daily as needed for Pain.     Vitals:    06/07/22 1005   BP: (!) 145/87   BP Source: Arm, Right Upper   Pulse: 86   Temp: 36.6 ?C (97.8 ?F)   Resp: 16   SpO2: 100%   TempSrc: Temporal   PainSc: Zero   Weight: 51 kg (112 lb 6.4 oz)       Body mass index is 21.24 kg/m?Marland Kitchen     Pain Score: Zero       Pain Addressed:  N/A    Patient Evaluated for a Clinical Trial: Patient not eligible for a treatment trial (including not needing treatment, needs palliative care, in remission).     [place] Cooperative Oncology Group performance status is 1, Restricted in physically strenuous activity but ambulatory and able to carry out work of a light or sedentary nature, e.g., light house work, office work.       Physical Exam  Vitals reviewed.   Constitutional:       General: She is not in acute distress.     Appearance: Normal appearance. She is well-developed. She is not ill-appearing, toxic-appearing or diaphoretic.      Comments: Hair thinning.    HENT:      Head: Normocephalic and atraumatic.      Nose: Nose normal. No rhinorrhea.      Mouth/Throat:      Mouth: Mucous membranes are moist. Mucous membranes are not pale. No oral lesions.      Pharynx: Oropharynx is clear. No oropharyngeal exudate or posterior oropharyngeal erythema.      Tonsils: No tonsillar abscesses.   Eyes:      General: No scleral icterus.        Right eye: No discharge.         Left eye: No discharge.      Extraocular Movements: Extraocular movements intact.      Conjunctiva/sclera: Conjunctivae normal.      Pupils: Pupils are equal, round, and reactive to light.   Cardiovascular:      Rate and Rhythm: Normal rate and regular rhythm.      Pulses: Normal pulses.      Heart sounds: Normal heart sounds. No murmur heard.  No gallop.   Pulmonary:      Effort: Pulmonary effort is normal. No respiratory distress.      Breath sounds: No stridor. No wheezing, rhonchi or rales.   Chest:      Chest wall: No tenderness.      Comments: Port site WNL  Abdominal:      General: A surgical scar is present. The ostomy site is clean. Bowel sounds are normal. There is no distension.      Palpations: There is no mass.      Tenderness: There is no abdominal tenderness. There is no guarding or rebound.      Hernia: No hernia is present.          Comments: Firmness in epigastric area.    Musculoskeletal:         General: No swelling, tenderness, deformity or signs of injury. Normal range of motion.      Cervical back: Normal range of motion and neck supple. No rigidity. No muscular tenderness.      Right lower leg: No edema.      Left lower leg: No edema.   Lymphadenopathy:      Cervical: No cervical adenopathy.   Skin:     General: Skin is warm and dry.      Coloration: Skin is not jaundiced or pale.      Findings: No bruising, erythema, lesion or rash.   Neurological:      General: No focal deficit present.      Mental Status: She is alert and oriented to person, place, and time. Mental status is at baseline.      Cranial Nerves: No cranial nerve deficit.      Motor: No weakness.      Coordination: Coordination normal.      Gait: Gait normal.   Psychiatric:         Mood and Affect: Mood normal.         Behavior: Behavior normal.         Thought Content: Thought content normal.         Judgment: Judgment normal.            CBC w/DIFF      Latest Ref Rng & Units 06/07/2022     8:09 AM 05/24/2022     7:15 AM 05/10/2022     7:56 AM 04/27/2022     7:35 AM 03/21/2022     7:36 AM   CBC with Diff   White Blood Cells 4.5 - 11.0 K/UL 7.5  8.8  7.6  11.8  11.0    RBC 4.0 - 5.0 M/UL 2.08  2.55  2.44  2.26  2.51    Hemoglobin 12.0 - 15.0 GM/DL 7.5  9.0  8.7  8.0  9.1    Hematocrit 36 - 45 % 22.2  27.0  25.5  23.9  26.7    MCV 80 - 100 FL 106.9  105.6  104.7  105.6  106.2    MCH 26 - 34 PG 36.1  35.2  35.6  35.6  36.3    MCHC 32.0 - 36.0 G/DL 16.1  09.6  04.5  40.9  34.2    RDW 11 - 15 % 16.9  15.9  15.0  15.2  16.1    Platelet Count 150 - 400 K/UL 450  450  262  377  452    MPV 7 - 11 FL 7.4  7.6  8.8  7.5  7.8  Neutrophils 41 - 77 % 36   67  52  51    Absolute Neutrophil Count 1.8 - 7.0 K/UL 2.70   5.10  6.10  5.40    Lymphocytes 24 - 44 % 31   17  23  23     Absolute Lymph Count 1.0 - 4.8 K/UL 2.30   1.30  2.80  2.60    Monocytes 4 - 12 % 30   12  16  18     Absolute Monocyte Count 0 - 0.80 K/UL 2.30   0.90  1.90  2.00    Eosinophils 0 - 5 % 2   4  7  8     Absolute Eosinophil Count 0 - 0.45 K/UL 0.10   0.30  0.90  0.90    Basophils 0 - 2 % 1   0  2  0    Absolute Basophil Count 0 - 0.20 K/UL 0.00   0.00  0.20  0.00      Comprehensive Metabolic Profile      Latest Ref Rng & Units 06/07/2022     8:09 AM 05/24/2022     7:15 AM 05/10/2022     7:56 AM 04/27/2022     7:35 AM 03/21/2022     7:36 AM   CMP   Sodium 137 - 147 MMOL/L 135  139  138  139  138    Potassium 3.5 - 5.1 MMOL/L 4.1  3.9  3.6  4.3  3.4    Chloride 98 - 110 MMOL/L 107  103  105  109  98    CO2 21 - 30 MMOL/L 21  28  24  22  30     Anion Gap 3 - 12 7  8  9  8  10     Blood Urea Nitrogen 7 - 25 MG/DL 24  19  31  19  29     Creatinine 0.4 - 1.00 MG/DL 1.61  0.96  0.45  4.09  2.33    Glucose 70 - 100 MG/DL 811  914  782  98  956    Calcium 8.5 - 10.6 MG/DL 7.9  8.7  8.2  8.8  9.2    Total Protein 6.0 - 8.0 G/DL 6.2  6.8  6.5  7.3  7.4    Albumin 3.5 - 5.0 G/DL 3.3  3.5  3.5  3.6  3.7    Alk Phosphatase 25 - 110 U/L 183  377  163  347  149    ALT (SGPT) 7 - 56 U/L 13  62  20  46  10    Total Bilirubin 0.3 - 1.2 MG/DL 0.5  0.7  0.9  0.7  0.6      Tumor Markers  Lab Results   Component Value Date    CEA 1,035.5 (H) 06/07/2022    CEA 1,073.0 (H) 05/24/2022    CEA 1,017.9 (H) 05/10/2022    CEA 1,003.3 (H) 04/27/2022    CEA 1,144.3 (H) 03/21/2022    CEA 838.9 (H) 02/28/2022    CEA 1,020.1 (H) 02/13/2022    CEA 911.3 (H) 01/30/2022    CEA 838.2 (H) 01/09/2022    CEA 511.8 (H) 12/12/2021    CA199 <1 06/07/2022    CA199 6 05/24/2022    CA199 <1 05/10/2022    CA199 <1 04/27/2022    CA199 <1 03/21/2022    CA199 <1 02/28/2022    CA199 <1 02/13/2022    CA199 <1 01/30/2022  CA199 <1 01/09/2022    CA199 <1 12/12/2021    CA125 41 (H) 01/21/2018    CA125 42 (H) 11/21/2017    CA125 46 (H) 11/05/2017    CA125 41 (H) 10/22/2017    CA125 34 10/08/2017    CA125 34 09/24/2017    CA125 31 09/10/2017    CA125 29 08/27/2017    CA125 34 08/15/2017    CA125 39 (H) 07/30/2017           Imaging:  - 04/27/21 CT CAP without contrast  CHEST:   1.  No significant change in small indeterminate anterior epiphrenic lymph nodes. No new thoracic lymphadenopathy.   2. No significant change to further mild increase in size of pulmonary and pleural metastases.   3. Increasing nodular and tree-in-bud opacities in the lingula and development of mild focal consolidation in the left lower lobe, indeterminate and may be infectious/inflammatory nodules or potentially pulmonary metastases.       ABDOMEN AND PELVIS:   1.  Grossly unchanged extensive peritoneal carcinomatosis.   2. No significant change in small residual indeterminate retroperitoneal lymph nodes. No discrete new abdominopelvic lymphadenopathy.   3. Grossly unchanged right anterior abdominal wall metastases.     CT ABD/PELV WO CONTRAST  Narrative: CT CHEST, ABDOMEN AND PELVIS    Clinical Indication: Female, 67 years old. Cancer of appendix. Peritoneal carcinomatosis.    Technique: Multiple contiguous axial images were obtained through the chest, abdomen and pelvis without IV contrast material. Post processing coronal and sagittal reconstruction images were made from the axial images.      IV contrast: None  Bowel contrast:  None    Comparison: Noncontrast CT chest, abdomen, and pelvis on 12/12/2021.    CHEST FINDINGS:    Evaluation of the mediastinum and hila, including the vasculature and for lymphadenopathy, is limited without the use of IV contrast.    Lower Neck: Unremarkable.    Axilla, Mediastinum and Hila: No thoracic lymphadenopathy. Unchanged small anterior diaphragmatic and lower paraesophageal lymph nodes that are indeterminate.     Heart and Great Vessels: Left subclavian chest port terminates in the SVC. Heart size normal. Coronary artery calcifications. Thoracic aorta normal in caliber. Mild calcified atherosclerotic plaque.    Airway, Lungs and Pleura: Several areas of peripheral mucous plugging, for example in the lingula. Improved previously new pulmonary opacities in the right lower lobe. No pleural effusion. No significant change in size of pulmonary and pleural metastases. Previously measured metastases as follows:    Right upper lobe, 2.3 cm (series 2 image 13) previously 2.3 cm.  Right lower lobe, 0.9 cm (series 2 image 38) previously 0.9 cm.    Chest Wall and Osseous Structures: No destructive osseous lesion.     Abdomen and Pelvis Findings:    Limited evaluation without the use of IV contrast which includes the viscera and vasculature.    Liver and Biliary system: Liver is normal in size. Persistent scalloping of the hepatic margins from peritoneal metastases. Unchanged right hepatic cyst. No obvious new parenchymal hepatic mass on noncontrast CT. Indwelling biliary stents. Persistent mild intrahepatic biliary ductal dilatation and pneumobilia.     Spleen: Splenectomy.    Adrenal Glands and Kidneys: Adrenal glands unremarkable. Several subcentimeter renal calculi. No hydronephrosis.    Pancreas and Retroperitoneum: Unopacified pancreas is partially obscured due to extensive peritoneal metastases. No significant change in small indeterminate retroperitoneal lymph nodes. No obvious new lymphadenopathy.    Aorta and Major Vessels: Normal caliber abdominal aorta. Mild  calcified atherosclerotic plaque.     Bowel, Mesentery and Peritoneal space: Distal colonic anastomosis and right lower quadrant ostomy. Omentectomy. No bowel obstruction. Grossly unchanged extensive peritoneal metastases, many of which are partially calcified.    Pelvis: Hysterectomy. Persistent gas distention of the vagina with trace layering fluid. Urinary bladder is incompletely distended and not well evaluated. No discrete pelvic lymphadenopathy.    Abdominal wall and Osseous Structures: Midline ventral abdominal incision scar. Grossly unchanged right anterior abdominal wall metastases. Previously measured subcutaneous right ventral abdominal wall metastasis measures 2.1 x 1.6 cm (series 2 image 139) previously 2.1 x 1.6 cm.    Unchanged mild superior endplate compression deformity at L1. Development of mild superior endplate compression deformity at L3. Grossly unchanged asymmetric marrow in the left sacrum (series 2 image 174).  Impression: CHEST:  1. No significant change in small indeterminate anterior diaphragmatic and paraesophageal lymph nodes. No new thoracic lymphadenopathy.    2. No significant change in pulmonary and pleural metastases.    ABDOMEN AND PELVIS:  1. Grossly unchanged extensive peritoneal carcinomatosis and right abdominal wall metastases.    2. No new abdominopelvic lymphadenopathy.    3. Development of mild superior endplate compression deformity at L3. Unchanged mild superior endplate compression deformity at L1.    4. Unchanged asymmetric marrow in the left sacrum, nonspecific and incompletely characterized on this exam but may be from osseous metastasis.     Finalized by Ancil Boozer, M.D. on 03/30/2022 12:44 PM. Dictated by Ancil Boozer, M.D. on 03/30/2022 12:14 PM.  CT CHEST WO CONTRAST  Narrative: CT CHEST, ABDOMEN AND PELVIS    Clinical Indication: Female, 67 years old. Cancer of appendix. Peritoneal carcinomatosis.    Technique: Multiple contiguous axial images were obtained through the chest, abdomen and pelvis without IV contrast material. Post processing coronal and sagittal reconstruction images were made from the axial images.      IV contrast: None  Bowel contrast:  None    Comparison: Noncontrast CT chest, abdomen, and pelvis on 12/12/2021.    CHEST FINDINGS:    Evaluation of the mediastinum and hila, including the vasculature and for lymphadenopathy, is limited without the use of IV contrast.    Lower Neck: Unremarkable.    Axilla, Mediastinum and Hila: No thoracic lymphadenopathy. Unchanged small anterior diaphragmatic and lower paraesophageal lymph nodes that are indeterminate.     Heart and Great Vessels: Left subclavian chest port terminates in the SVC. Heart size normal. Coronary artery calcifications. Thoracic aorta normal in caliber. Mild calcified atherosclerotic plaque.    Airway, Lungs and Pleura: Several areas of peripheral mucous plugging, for example in the lingula. Improved previously new pulmonary opacities in the right lower lobe. No pleural effusion. No significant change in size of pulmonary and pleural metastases. Previously measured metastases as follows:    Right upper lobe, 2.3 cm (series 2 image 13) previously 2.3 cm.  Right lower lobe, 0.9 cm (series 2 image 38) previously 0.9 cm.    Chest Wall and Osseous Structures: No destructive osseous lesion.     Abdomen and Pelvis Findings:    Limited evaluation without the use of IV contrast which includes the viscera and vasculature.    Liver and Biliary system: Liver is normal in size. Persistent scalloping of the hepatic margins from peritoneal metastases. Unchanged right hepatic cyst. No obvious new parenchymal hepatic mass on noncontrast CT. Indwelling biliary stents. Persistent mild intrahepatic biliary ductal dilatation and pneumobilia.     Spleen: Splenectomy.    Adrenal  Glands and Kidneys: Adrenal glands unremarkable. Several subcentimeter renal calculi. No hydronephrosis.    Pancreas and Retroperitoneum: Unopacified pancreas is partially obscured due to extensive peritoneal metastases. No significant change in small indeterminate retroperitoneal lymph nodes. No obvious new lymphadenopathy.    Aorta and Major Vessels: Normal caliber abdominal aorta. Mild calcified atherosclerotic plaque.     Bowel, Mesentery and Peritoneal space: Distal colonic anastomosis and right lower quadrant ostomy. Omentectomy. No bowel obstruction. Grossly unchanged extensive peritoneal metastases, many of which are partially calcified.    Pelvis: Hysterectomy. Persistent gas distention of the vagina with trace layering fluid. Urinary bladder is incompletely distended and not well evaluated. No discrete pelvic lymphadenopathy.    Abdominal wall and Osseous Structures: Midline ventral abdominal incision scar. Grossly unchanged right anterior abdominal wall metastases. Previously measured subcutaneous right ventral abdominal wall metastasis measures 2.1 x 1.6 cm (series 2 image 139) previously 2.1 x 1.6 cm.    Unchanged mild superior endplate compression deformity at L1. Development of mild superior endplate compression deformity at L3. Grossly unchanged asymmetric marrow in the left sacrum (series 2 image 174).  Impression: CHEST:  1. No significant change in small indeterminate anterior diaphragmatic and paraesophageal lymph nodes. No new thoracic lymphadenopathy.    2. No significant change in pulmonary and pleural metastases.    ABDOMEN AND PELVIS:  1. Grossly unchanged extensive peritoneal carcinomatosis and right abdominal wall metastases.    2. No new abdominopelvic lymphadenopathy.    3. Development of mild superior endplate compression deformity at L3. Unchanged mild superior endplate compression deformity at L1.    4. Unchanged asymmetric marrow in the left sacrum, nonspecific and incompletely characterized on this exam but may be from osseous metastasis.     Finalized by Ancil Boozer, M.D. on 03/30/2022 12:44 PM. Dictated by Ancil Boozer, M.D. on 03/30/2022 12:14 PM.  GI ENDO CATH BIL & PANC DUCT  This order has been auto finalized and does not contain a result.        ASSESSMENT AND PLAN:    1. Metastatic appendiceal carcinoma. Signet ring adenocarcinoma, MSS, with peritoneal carcinomatosis with  pseudomyxoma peritonei.  02/19/17 diagnostic laparoscopy with biopsies of the right diaphragm, right liver, right fallopian tube and left pelvis.  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  Planned for exploratory laparotomy with CRS/HIPEC in combination with a TAH/BSO in 03/2017.  Developed intestinal obstruction and taken to OR for CRS/HIPEC surgery; however multiple adhesions, omental cracking. CRS/HIPEC was aborted.  Immunotherapy on epacadostat + sirolimus trial 12/24/17-05/21/18.  She stopped study drug on 05/21/18 for mitomycin HIPEC trial at [facility] [place].  On 05/28/18 diagnostic laparoscopy, evacuation of mucinous ascites 1.5L, peritoneal biopsy, laparoscopic HIPEC with mitomycin-C in [place] by Dr. Daryl Eastern.  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  Completed second laparoscopic HIPEC on 06/25/18.  Again 1.5L removed.  09/12/18 underwent debulking at [facility] in [place], [place].  Removed 98% by Dr. [name].  Discharged on 09/30/18.  She had L pleural effusion requiring chest tube drainage; intra-abd abscess 10/2018; discharged on 11/04/18 with drain and IV cefepime and Diflucan. Her ureteral stents were removed and ostomy relocated in 10/2018.  Hospitalized in 01/2019 for AKI 2/2 dehydration and likely PE (V/Q scan and trop elevation). CEA increasing again. Repeat CT CAP 03/20/19 with stable to slight increase in cancer and RUL nodule.  Discussed observation vs treatment. She chose observation and is considering repeat CRS + HIPEC via Dr. [name] in [place].  CT scans from  09/24/19 of the abdomen, without contrast, appear stable.  The peritoneal thickening, carcinomatosis, retroperitoneal lymph nodes, a small amount of fluid collection or trace ascites, appear to be stable compared to 3 months ago. 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  CT scan on 08/17/20 showed a right lung mass that is slightly increased from 11 mm to 14 mm. There are some new lung nodules that have developed, a small one in the left lung, a new nodule in the right lung. Inside the abdomen we are seeing continued increase in size of the peritoneal nodules. The largest measuring 14 mm compared to 12 mm.  The nodule lower in the pelvis increased from 16 mm to 21 mm.  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  TMB = 1.3.  S/p CRS w/ ostomy revision and tumor removal around stoma on 10/14/20.  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 [name] PA from [facility] in [place] called to relay their TB recommendations for patient.  Recommendations to proceed with FOLFIRI/Avastin with holding Avastin 6-8 weeks post-op. They also request if our office can repeat genomic testing.  Repeat TEMPUS from 10/14/20.  Pre-screen for KRAS G12D inhibitor trial, if not eligible will plan for FOLFIRI +/- bevacizumab. Tolerated C1 FOLFIRI but more diarrhea and N/V after C2. Decreased irinotecan to 130 mg/m2 due to diarrhea. S/p C4, with bevacizumab added on. CT scan 02/16/21 abdominal wall spots are under control and stable. There is a slight increase in size of the lung nodules. The radiologist is measuring 10 mm to 8 mm in 11/2020. The other right lung nodule is more or less the same at 15 mm. CT scans with stable disease, mild increase in size of pulmonary metastases (specifically the right upper lobe nodule 2.2cm, previously 1.5cm). There is also some increasing nodular and tree-in-bud opacities in the lingula and development of mild focal consolidation in the left lower lobe, indeterminate but may be related to recent COVID infection vs pulmonary metastases. Given overall stability and down-trending CEA, we will plan to continue with the current treatment regimen for now. Dr. Dorita Fray with Radiation Oncology reviewed imaging (already received 50Gy to right lung 3/1-05/25/20) and will continue to monitor. CT scan on 08/03/2021 shows the cancer inside the abdomen and the abdominal wall metastasis is remaining the same compared to the scans done in 04/2021. The lung metastasis appears slightly larger by about 2 mm. Abnormal labs, even on repeat. Chemo held. Ultrasound reveals moderate ductal dilatation. Refer to GI for ERCP. Continued elevation of liver enzymes and bilirubin. MRI MRCP performed on 09/07/2021 shows extrinsic compression of the common bile duct causing biliary dilatation, as well as both intrahepatic ducts on the right and left side. CT scan 09/28/2021 shows the cancer was overall stable. However, the fact that it is causing biliary obstruction, ureter obstruction, and hydronephrosis tells [place] that clinically it is growing. There was mild hydronephrosis on both kidneys. cycle 18 of FOLFIRI,  stent replacement in 3 weeks, will hold bevacizumab. In October, re-discuss decreasing her Irinotecan to every 4 weeks if no worsening of labs and/or symptoms. CT scan on 12/12/2021 shows the peritoneal mass is stable. The dilation of the bile ducts has improved. The nodules in the lung are stable. There are new cluster of nodules which appear like inflammation. CT scan of the chest, abdomen, and pelvis performed on 03/30/2022, when compared shows that her  lung nodules, abdominal wall, tumor in the abdomen, and the peritoneal carcinomatosis appears the same.      Plan 06/07/22:    Patient reports more fatigue particularly after chemotherapy. Recent viral infection and travel may have contributed to current fatigue. Tumor markers are stable.  -Reduce chemotherapy regimen for this cycle, holding the irinotecan and continuing with the pump and Avastin due to fatigue. continue with IVF and magnesium today, then Friday and weekly at home.   -Plan to resume full chemotherapy regimen at next cycle.  next images due in May.     Urinary issues: recurrent UTIs. follows with an outside urologist. recently consulted with Dr. Marjie Skiff but has not had any adjustments in her medications. Patient is currently on Cipro every third day.  -Continue current management and monitor for changes.    Anemia: Hemoglobin is lower today, possibly due to recent viral infection and/or chemotherapy.  -Consider transfusion if hemoglobin drops to 7 or if patient's symptoms worsen.    Renal function: Creatinine is elevated (2.5) but slightly better than 2.6-2.89, possibly due to recent viral infection and/or dehydration from diarrhea. she had IVF and mag at home Monday. better today.   -Administer magnesium and fluids today.  -Check creatinine level at next lab visit.     Diarrhea. Better with Lomotil daily. Continue IV fluids 3 times a week locally. Increased her Lomotil to 2 tablets 4 times daily. She will continue to take Metamucil to bulk up her stools. She had a day of decreased stool output but was able to flush the Metamucil out with an enema. Bowels are back to baseline now. Continue to take Imodium as needed, okay to increase to 2 tabs at a time as needed. Better with advanced diet. Obstruction with too much fiber. Better with break in chemo. Monitor. IVF as needed throughout week. Improved. Continue Imodium and Lomotil as needed.     Hypomagnesemia. Due to large volume fluid loss. 1.2 today. Replace in treatment 1 gm. She is on oral supplements at home Mag oxide as well     Elevated LFTs. May be irinotecan, tylenol, cipro. Hold tylenol. Continue antibiotics. Improved after holding tylenol. Now elevated again with bilirubin 2.7, AST 66 and ALT 106. Some improvement after IVF but bilirubin still elevated at 2.3. ultrasound of abdomen to assess for biliary obstruction/dilation. This revealed moderate intrahepatic bile duct dilatation. 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 Refer to GI for possible ERCP. Stent placed. Now bili higher at 4.1 and LFTs higher. White count elevated. Admitted to hospital for repeat ERCP. They were able to extend stent, no PTC. Bilirubin now 1.4. ok to resume treatment. Repeat scope 9/7. WNL today. Replace in April.       Turner Daniels, APRN-NP          The patient and family were allowed to ask questions and voice concerns; these were addressed to the best of our ability. They expressed understanding of what was explained to them, and they agreed with the present plan. Patient has the phone numbers for the Cancer Center and was instructed on how to contact us with any questions or concerns. My collaborating physician on this case is Dr. Josiah Lobo.

## 2022-06-08 ENCOUNTER — Encounter: Admit: 2022-06-08 | Discharge: 2022-06-08 | Payer: MEDICARE

## 2022-06-08 MED ORDER — DEXAMETHASONE 4 MG PO TAB
ORAL_TABLET | INTRAMUSCULAR | 3 refills | 12.50000 days | Status: AC
Start: 2022-06-08 — End: ?

## 2022-06-17 ENCOUNTER — Encounter: Admit: 2022-06-17 | Discharge: 2022-06-17 | Payer: MEDICARE

## 2022-06-17 DIAGNOSIS — C801 Malignant (primary) neoplasm, unspecified: Secondary | ICD-10-CM

## 2022-06-17 DIAGNOSIS — K219 Gastro-esophageal reflux disease without esophagitis: Secondary | ICD-10-CM

## 2022-06-17 DIAGNOSIS — I454 Nonspecific intraventricular block: Secondary | ICD-10-CM

## 2022-06-17 DIAGNOSIS — C181 Malignant neoplasm of appendix: Secondary | ICD-10-CM

## 2022-06-17 DIAGNOSIS — C189 Malignant neoplasm of colon, unspecified: Secondary | ICD-10-CM

## 2022-06-20 ENCOUNTER — Encounter: Admit: 2022-06-20 | Discharge: 2022-06-20 | Payer: MEDICARE

## 2022-06-21 ENCOUNTER — Encounter: Admit: 2022-06-21 | Discharge: 2022-06-21 | Payer: MEDICARE

## 2022-06-21 DIAGNOSIS — C786 Secondary malignant neoplasm of retroperitoneum and peritoneum: Secondary | ICD-10-CM

## 2022-06-21 LAB — COMPREHENSIVE METABOLIC PANEL
ALBUMIN: 3.5 g/dL (ref 3.5–5.0)
ALK PHOSPHATASE: 247 U/L — ABNORMAL HIGH (ref 25–110)
ALT: 23 U/L (ref 7–56)
ANION GAP: 11 (ref 3–12)
AST: 43 U/L — ABNORMAL HIGH (ref 7–40)
BLD UREA NITROGEN: 25 mg/dL (ref 7–25)
CALCIUM: 8.8 mg/dL (ref 8.5–10.6)
CHLORIDE: 95 MMOL/L — ABNORMAL LOW (ref 98–110)
CO2: 34 MMOL/L — ABNORMAL HIGH (ref 21–30)
CREATININE: 2.9 mg/dL — ABNORMAL HIGH (ref 0.4–1.00)
EGFR: 17 mL/min — ABNORMAL LOW (ref >60)
GLUCOSE,PANEL: 133 mg/dL — ABNORMAL HIGH (ref 70–100)
POTASSIUM: 3.4 MMOL/L — ABNORMAL LOW (ref 3.5–5.1)
SODIUM: 140 MMOL/L (ref 137–147)
TOTAL PROTEIN: 7 g/dL (ref 6.0–8.0)

## 2022-06-21 LAB — CBC AND DIFF
ABSOLUTE BASO COUNT: 0.1 K/UL (ref 0–0.20)
ABSOLUTE EOS COUNT: 0.3 K/UL (ref 0–0.45)
ABSOLUTE LYMPH COUNT: 2.3 K/UL (ref 1.0–4.8)
ABSOLUTE MONO COUNT: 2.5 K/UL — ABNORMAL HIGH (ref 0–0.80)
ABSOLUTE NEUTROPHIL: 6.6 K/UL (ref 1.8–7.0)
BASOPHILS %: 1 % (ref 0–2)
EOSINOPHILS %: 2 % (ref 0–5)
HEMATOCRIT: 28 % — ABNORMAL LOW (ref 36–45)
LYMPHOCYTES %: 20 % — ABNORMAL LOW (ref 24–44)
MCH: 34 pg — ABNORMAL HIGH (ref 26–34)
MONOCYTES %: 21 % — ABNORMAL HIGH (ref 4–12)
MPV: 7.5 FL (ref 7–11)
NEUTROPHILS %: 56 % (ref 41–77)
PLATELET COUNT: 437 K/UL — ABNORMAL HIGH (ref 150–400)
RBC COUNT: 2.6 M/UL — ABNORMAL LOW (ref 4.0–5.0)
RDW: 16 % — ABNORMAL HIGH (ref 11–15)
WBC COUNT: 11 K/UL — ABNORMAL HIGH (ref 4.5–11.0)

## 2022-06-21 LAB — MAGNESIUM: MAGNESIUM: 1.3 mg/dL — ABNORMAL LOW (ref 1.6–2.6)

## 2022-06-21 LAB — CEA(CARCINOEMBRYONIC AG): CEA: 117 ng/mL — ABNORMAL HIGH (ref <3.0)

## 2022-06-21 LAB — CA19.9: CA 19-9: <1 U/mL — ABNORMAL LOW (ref <35)

## 2022-06-21 MED ORDER — BEVACIZUMAB-AWWB IVPB
5 mg/kg | Freq: Once | INTRAVENOUS | 0 refills | Status: CP
Start: 2022-06-21 — End: ?
  Administered 2022-06-21 (×2): 260.5 mg via INTRAVENOUS

## 2022-06-21 MED ORDER — FLUOROURACIL IV AMB PUMP
2400 mg/m2 | Freq: Once | INTRAVENOUS | 0 refills | Status: CP
Start: 2022-06-21 — End: ?
  Administered 2022-06-21 (×4): 3624 mg via INTRAVENOUS

## 2022-06-21 MED ORDER — MAGNESIUM SULFATE IN D5W 1 GRAM/100 ML IV PGBK (CC ONLY)
1 g | Freq: Once | INTRAVENOUS | 0 refills | Status: CP
Start: 2022-06-21 — End: ?
  Administered 2022-06-21: 14:00:00 1 g via INTRAVENOUS

## 2022-06-21 MED ORDER — ATROPINE 0.4 MG/ML IJ SOLN
.4 mg | INTRAVENOUS | 0 refills | Status: AC | PRN
Start: 2022-06-21 — End: ?
  Administered 2022-06-21: 15:00:00 0.4 mg via INTRAVENOUS

## 2022-06-21 MED ORDER — SODIUM CHLORIDE 0.9 % IV SOLP
1000 mL | Freq: Once | INTRAVENOUS | 0 refills | Status: CP
Start: 2022-06-21 — End: ?
  Administered 2022-06-21: 14:00:00 1000 mL via INTRAVENOUS

## 2022-06-21 MED ORDER — DEXAMETHASONE 4 MG PO TAB
4 mg | Freq: Once | ORAL | 0 refills | Status: CP
Start: 2022-06-21 — End: ?
  Administered 2022-06-21: 14:00:00 4 mg via ORAL

## 2022-06-21 MED ORDER — IRINOTECAN IVPB
110 mg/m2 | Freq: Once | INTRAVENOUS | 0 refills | Status: CP
Start: 2022-06-21 — End: ?
  Administered 2022-06-21 (×2): 166.2 mg via INTRAVENOUS

## 2022-06-21 MED ORDER — PALONOSETRON 0.25 MG/5 ML IV SOLN
.25 mg | Freq: Once | INTRAVENOUS | 0 refills | Status: CP
Start: 2022-06-21 — End: ?
  Administered 2022-06-21: 14:00:00 0.25 mg via INTRAVENOUS

## 2022-06-21 MED FILL — RXAMB APAP-MAALOX-LIDOCAINE 1:3:1 SUSPENSION (MIXTURE COMPOUND): ORAL | 2 days supply | Qty: 60 | Fill #3 | Status: CP

## 2022-06-21 NOTE — Progress Notes
CHEMO NOTE  Verified chemo consent signed and in chart.    Blood return positive via: Port (Single and Accessed)    BSA and dose double checked (agree with orders as written) with: yes See MAR for dual RN sign-off    Labs/applicable tests checked: CBC and Comprehensive Metabolic Panel (CMP)    Chemo regimen: Drug/cycle/day: [id number] Avastin and FOLFIRI    Rate verified and armband double check with second RN: yes    Patient education offered and stated understanding. Denies questions at this time.    Patient here for Avastin and FOLFIRI. Patient reports she is doing well and denies any new issues or concerns. PAC accessed in lab; flushed well with positive blood return noted. Labs reviewed and within normal limits for treatment. Premeds and pre-hydration given per MAR. Patient tolerated Avastin and FOLFIRI without issue. 5 FU ambulatory pump attached to patient. All clamps open, all connections secured and pump running upon discharge.

## 2022-06-21 NOTE — Progress Notes
I have reviewed the notes, assessments, and/or procedures performed by Haley Lampson, and concur with her/his documentation unless otherwise noted.

## 2022-06-21 NOTE — Progress Notes
Integrative Care Service Encounter     Preferred Name: [facility] Physician: Dr Vanita Ingles    Breathing Techniques  and Meditation discussed to promote relaxation and self-calming techniques.     Teaching provided: Verbal, Written, and Interactive      Recommendations/Plan:  Discussed self-calming techniques that may be of interest to patient.  Provided information for follow up education and resources at [employer].  Encouraged patient to explore [employer] and available resources.      Geri Seminole, MSN, RN  Integrative Care Nurse

## 2022-07-03 ENCOUNTER — Encounter: Admit: 2022-07-03 | Discharge: 2022-07-03 | Payer: MEDICARE

## 2022-07-03 MED ORDER — ATROPINE 0.4 MG/ML IJ SOLN
0.4 mg | INTRAVENOUS | 0 refills | PRN
Start: 2022-07-03 — End: ?

## 2022-07-03 MED ORDER — PALONOSETRON 0.25 MG/5 ML IV SOLN
.25 mg | Freq: Once | INTRAVENOUS | 0 refills
Start: 2022-07-03 — End: ?

## 2022-07-03 MED ORDER — FLUOROURACIL IV AMB PUMP
2400 mg/m2 | Freq: Once | INTRAVENOUS | 0 refills
Start: 2022-07-03 — End: ?

## 2022-07-03 MED ORDER — DEXAMETHASONE 4 MG PO TAB
4 mg | Freq: Once | ORAL | 0 refills
Start: 2022-07-03 — End: ?

## 2022-07-05 ENCOUNTER — Encounter: Admit: 2022-07-05 | Discharge: 2022-07-05 | Payer: MEDICARE

## 2022-07-05 DIAGNOSIS — C801 Malignant (primary) neoplasm, unspecified: Secondary | ICD-10-CM

## 2022-07-05 DIAGNOSIS — K219 Gastro-esophageal reflux disease without esophagitis: Secondary | ICD-10-CM

## 2022-07-05 DIAGNOSIS — C786 Secondary malignant neoplasm of retroperitoneum and peritoneum: Secondary | ICD-10-CM

## 2022-07-05 DIAGNOSIS — C189 Malignant neoplasm of colon, unspecified: Secondary | ICD-10-CM

## 2022-07-05 DIAGNOSIS — C181 Malignant neoplasm of appendix: Secondary | ICD-10-CM

## 2022-07-05 DIAGNOSIS — I454 Nonspecific intraventricular block: Secondary | ICD-10-CM

## 2022-07-05 LAB — CEA(CARCINOEMBRYONIC AG): CEA: 104 ng/mL — ABNORMAL HIGH (ref <3.0)

## 2022-07-05 LAB — COMPREHENSIVE METABOLIC PANEL
AST: 17 U/L — ABNORMAL LOW (ref 7–40)
CALCIUM: 8.5 mg/dL — ABNORMAL HIGH (ref 8.5–10.6)
CREATININE: 2.3 mg/dL — ABNORMAL HIGH (ref 0.4–1.00)
POTASSIUM: 4.4 MMOL/L (ref 3.5–5.1)
SODIUM: 138 MMOL/L (ref 137–147)

## 2022-07-05 LAB — CA19.9: CA 19-9: <1 U/mL — ABNORMAL LOW (ref <35)

## 2022-07-05 LAB — CBC AND DIFF
ABSOLUTE NEUTROPHIL: 1.5 K/UL — ABNORMAL LOW (ref 1.8–7.0)
MCHC: 33 g/dL — ABNORMAL LOW (ref 32.0–36.0)
RDW: 16 % — ABNORMAL HIGH (ref 11–15)

## 2022-07-05 MED ORDER — FLUOROURACIL IV AMB PUMP
2200 mg/m2 | Freq: Once | INTRAVENOUS | 0 refills | Status: CP
Start: 2022-07-05 — End: ?
  Administered 2022-07-05 (×3): 3322 mg via INTRAVENOUS

## 2022-07-05 MED ORDER — PALONOSETRON 0.25 MG/5 ML IV SOLN
.25 mg | Freq: Once | INTRAVENOUS | 0 refills | Status: CP
Start: 2022-07-05 — End: ?
  Administered 2022-07-05: 16:00:00 0.25 mg via INTRAVENOUS

## 2022-07-05 MED ORDER — ATROPINE 0.4 MG/ML IJ SOLN
.4 mg | INTRAVENOUS | 0 refills | Status: AC | PRN
Start: 2022-07-05 — End: ?
  Administered 2022-07-05: 17:00:00 0.4 mg via INTRAVENOUS

## 2022-07-05 MED ORDER — MAGNESIUM SULFATE IN D5W 1 GRAM/100 ML IV PGBK (CC ONLY)
1 g | INTRAVENOUS | 0 refills | Status: CP
Start: 2022-07-05 — End: ?
  Administered 2022-07-05 (×2): 1 g via INTRAVENOUS

## 2022-07-05 MED ORDER — IRINOTECAN IVPB
110 mg/m2 | Freq: Once | INTRAVENOUS | 0 refills | Status: CP
Start: 2022-07-05 — End: ?
  Administered 2022-07-05 (×3): 166.2 mg via INTRAVENOUS

## 2022-07-05 MED ORDER — FLUOROURACIL IV AMB PUMP
2200 mg/m2 | Freq: Once | INTRAVENOUS | 0 refills
Start: 2022-07-05 — End: ?

## 2022-07-05 MED ORDER — SODIUM CHLORIDE 0.9 % IV SOLP
1000 mL | Freq: Once | INTRAVENOUS | 0 refills | Status: CP
Start: 2022-07-05 — End: ?
  Administered 2022-07-05: 16:00:00 1000 mL via INTRAVENOUS

## 2022-07-05 MED ORDER — BEVACIZUMAB-AWWB IVPB
5 mg/kg | Freq: Once | INTRAVENOUS | 0 refills | Status: CP
Start: 2022-07-05 — End: ?
  Administered 2022-07-05 (×2): 260.5 mg via INTRAVENOUS

## 2022-07-05 MED ORDER — FLUOROURACIL IV AMB PUMP
2200 mg/m2 | Freq: Once | INTRAVENOUS | 0 refills | Status: CN
Start: 2022-07-05 — End: ?

## 2022-07-05 MED ORDER — DEXAMETHASONE 4 MG PO TAB
4 mg | Freq: Once | ORAL | 0 refills | Status: CP
Start: 2022-07-05 — End: ?
  Administered 2022-07-05: 16:00:00 4 mg via ORAL

## 2022-07-05 NOTE — Progress Notes
Day 1 Cycle 30 MVASI + FOLFIRI     Patient to follow up with Denny Peon, NP. See clinic note for assessment details.   Port flushed, brisk blood return noted.   OK to treat, labs OK to treat.  Tolerated infusion well. Atropine x1 given per patient request.   IVF + IV magnesium replacement given per orders.   Discharged in stable condition with adrucil infusing via ambulatory pump.     CHEMO NOTE  Verified chemo consent signed and in chart.    Blood return positive via: Port (Single and Accessed)    BSA and dose double checked (agree with orders as written) with: yes     Labs/applicable tests checked: CBC, Comprehensive Metabolic Panel (CMP), and BP    Chemo regimen: Drug/cycle/day MVASI + Irinotecan + Adrucil     Rate verified and armband double check with second RN: yes, see EMAR.     Patient education offered and stated understanding. Denies questions at this time.

## 2022-07-05 NOTE — Patient Instructions
[facility]  Chemotherapy Instructions    Vicki Buchanan 07/05/2022    Call Immediately to report the following:  Unexplained bleeding or bleeding that will not stop  Difficulty swallowing  Shortness of breath, wheezing, or trouble breathing  Rapid, irregular heartbeat; chest pain  Dizziness, lightheadedness  Rash or cut that swells or turns red, feels hot or painful, or begin to ooze  Diarrhea   Uncontrolled nausea or vomiting  Fever of 100.4 F or higher, or chills    Important Phone Numbers:  Cancer Center Main Number (answered 24 hours a day) [phone]  Cancer Center Scheduling (appointments) [phone]  Social Worker [phone]

## 2022-07-05 NOTE — Progress Notes
Date of Service: 07/05/2022    SUBJECTIVE:             Reason for Visit:  Follow Up    Vicki Buchanan is a 67 y.o. 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  02/19/17 diagnostic laparoscopy with biopsies of the right diaphragm, right liver, right fallopian tube and left pelvis.  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  Planned for exploratory laparotomy with CRS/HIPEC in combination with a TAH/BSO in 03/2017.  Developed intestinal obstruction and taken to OR for CRS/HIPEC surgery; however multiple adhesions, omental cracking. CRS/HIPEC was aborted.  Immunotherapy on epacadostat + sirolimus trial 12/24/17-05/21/18.  She stopped study drug on 05/21/18 for mitomycin HIPEC trial at [facility] [place].  On 05/28/18 diagnostic laparoscopy, evacuation of mucinous ascites 1.5L, peritoneal biopsy, laparoscopic HIPEC with mitomycin-C in [place] by Dr. Daryl Eastern.  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  Completed second laparoscopic HIPEC on 06/25/18.  Again 1.5L removed.  09/12/18 underwent debulking at [facility] in [place], [place].  Removed 98% by Dr. [name].  Discharged on 09/30/18.  She had L pleural effusion requiring chest tube drainage; intra-abd abscess 10/2018; discharged on 11/04/18 with drain and IV cefepime and Diflucan. Her ureteral stents were removed and ostomy relocated in 10/2018.  Hospitalized in 01/2019 for AKI 2/2 dehydration and likely PE (V/Q scan and trop elevation). CEA increasing again. Repeat CT CAP 03/20/19 with stable to slight increase in cancer and RUL nodule.  Discussed observation vs treatment. She chose observation and is considering repeat CRS + HIPEC via Dr. [name] in [place].  CT scans from 09/24/19 of the abdomen, without contrast, appear stable.  The peritoneal thickening, carcinomatosis, retroperitoneal lymph nodes, a small amount of fluid collection or trace ascites, appear to be stable compared to 3 months ago. 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  CT scan on 08/17/20 showed a right lung mass that is slightly increased from 11 mm to 14 mm. There are some new lung nodules that have developed, a small one in the left lung, a new nodule in the right lung. Inside the abdomen we are seeing continued increase in size of the peritoneal nodules. The largest measuring 14 mm compared to 12 mm.  The nodule lower in the pelvis increased from 16 mm to 21 mm.  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  TMB = 1.3.  S/p CRS w/ ostomy revision and tumor removal around stoma on 10/14/20.  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 [name] PA from [facility] in [place] called to relay their TB recommendations for patient.  Recommendations to proceed with FOLFIRI/Avastin with holding Avastin 6-8 weeks post-op. They also request if our office can repeat genomic testing.  Repeat TEMPUS from 10/14/20.  Pre-screen for KRAS G12D inhibitor trial, if not eligible will plan for FOLFIRI +/- bevacizumab.        Interval Hx 07/05/22:       The patient presents today for next dose of chemo. She reports a recent back injury sustained while moving some boxes at home. The patient reports feeling wonderful prior to the incident but has since been using a wheelchair due to the pain. The patient's spouse reports that the patient had a recent scare with a melanoma diagnosis. She mentions that her neutrophils and  hemoglobin levels were low in a recent lab test, despite receiving two bags of fluids. She also reports that her magnesium levels have been consistently low since her second surgery in July 2022. She had more diarrhea with last cycle.             Review of Systems   Constitutional:  Positive for fatigue. Negative for activity change, appetite change, chills, diaphoresis, fever and unexpected weight change.   HENT:  Negative for facial swelling, mouth sores, sore throat, trouble swallowing and voice change.    Eyes: Negative.  Negative for visual disturbance.   Respiratory: Negative.  Negative for cough, chest tightness, shortness of breath and wheezing.    Cardiovascular: Negative.  Negative for chest pain, palpitations and leg swelling.   Gastrointestinal:  Positive for diarrhea. Negative for abdominal distention, abdominal pain, anal bleeding, blood in stool, constipation, nausea, rectal pain and vomiting.   Genitourinary: Negative.  Negative for difficulty urinating, dysuria and hematuria.   Musculoskeletal: Negative.  Negative for arthralgias, back pain, neck pain and neck stiffness.   Skin:  Negative for color change, pallor, rash and wound.   Neurological:  Positive for numbness. Negative for dizziness, weakness, light-headedness and headaches.   Hematological:  Does not bruise/bleed easily.   Psychiatric/Behavioral: Negative.  Negative for agitation. The patient is not nervous/anxious.        Medical History:   Diagnosis Date    Acid reflux     Bundle branch block     Cancer (HCC)     Cancer of appendix (HCC)     Cancer of colon (HCC) 11/19     Surgical History:   Procedure Laterality Date    CESAREAN SECTION  1981    HX CHOLECYSTECTOMY  1995    LAPAROSCOPY  02/2017    biopsy peritoneal    COLONOSCOPY DIAGNOSTIC WITH SPECIMEN COLLECTION BY BRUSHING/ WASHING - FLEXIBLE N/A 03/19/2017    Performed by Everardo All, MD at [facility] ENDO    COLONOSCOPY WITH BIOPSY - FLEXIBLE  03/19/2017    Performed by Everardo All, MD at [facility] ENDO    EXPLORATORY LAPAROTOMY, DIVERTING LOOP ILEOSTOMY N/A 04/03/2017    Performed by Loralie Champagne, MD at [facility] OR    INSERTION TUNNELED CENTRAL VENOUS CATHETER - AGE 35 YEARS AND OVER Left 04/23/2017    Performed by Loralie Champagne, MD at [facility] OR    CYSTOURETHROSCOPY WITH INDWELLING URETERAL STENT INSERTION Bilateral 04/09/2018    Performed by Earl Many, MD at [facility] OR    CYSTOURETHROSCOPY WITH URETERAL CATHETERIZATION WITH/ WITHOUT IRRIGATION/ INSTILLATION/ URETEROPYELOGRAPHY Bilateral 04/09/2018    Performed by Earl Many, MD at [facility] OR    CYSTOURETHROSCOPY WITH INDWELLING URETERAL STENT EXCHANGE (RIGHT 6 x 26cm / LEFT 6 x 28cm) Bilateral 08/08/2018    Performed by Earl Many, MD at [facility] OR    RETROGRADE UROGRAPHY WITH/ WITHOUT KUB Bilateral 08/08/2018    Performed by Earl Many, MD at [facility] OR    ENDOSCOPIC RETROGRADE CHOLANGIOPANCREATOGRAPHY WITH SPHINCTEROTOMY/ PAPILLOTOMY N/A 09/16/2021    Performed by Vertell Novak, MD at [facility] ENDO    ESOPHAGOGASTRODUODENOSCOPY WITH ENDOSCOPIC ULTRASOUND EXAMINATION - FLEXIBLE  09/16/2021    Performed by Vertell Novak, MD at [facility] ENDO    ENDOSCOPIC RETROGRADE CHOLANGIOPANCREATOGRAPHY WITH PLACEMENT ENDOSCOPIC STENT INTO BILIARY/ PANCREATIC DUCT - EACH STENT  09/16/2021    Performed by Vertell Novak, MD at [facility] ENDO    COLONOSCOPY DIAGNOSTIC WITH SPECIMEN COLLECTION BY BRUSHING/ WASHING - FLEXIBLE N/A 09/26/2021  Performed by Vertell Novak, MD at [facility] OR    ENDOSCOPIC RETROGRADE CHOLANGIOPANCREATOGRAPHY WITH PLACEMENT ENDOSCOPIC STENT INTO BILIARY/ PANCREATIC DUCT - EACH STENT  09/29/2021    Performed by Eliott Nine, MD at [facility] ENDO    ENDOSCOPIC RETROGRADE CHOLANGIOPANCREATOGRAPHY with plastic stent exchange N/A 10/27/2021    Performed by Eliott Nine, MD at [facility] ENDO    ENDOSCOPIC RETROGRADE CHOLANGIOPANCREATOGRAPHY WITH ABLATION TUMOR/ POLYP/ OTHER LESION  10/27/2021    Performed by Eliott Nine, MD at [facility] ENDO    ENDOSCOPIC RETROGRADE CHOLANGIOPANCREATOGRAPHY WITH REMOVAL CALCULI/ DEBRIS FROM BILIARY/ PANCREATIC DUCT  10/27/2021    Performed by Eliott Nine, MD at [facility] ENDO    ENDOSCOPIC RETROGRADE CHOLANGIOPANCREATOGRAPHY  plastic stent replacement N/A 12/22/2021    Performed by Eliott Nine, MD at [facility] ENDO    ENDOSCOPIC RETROGRADE CHOLANGIOPANCREATOGRAPHY N/A 03/30/2022    Performed by Eliott Nine, MD at [facility] ENDO    ENDOSCOPIC RETROGRADE CHOLANGIOPANCREATOGRAPHY WITH REMOVAL AND EXCHANGE OF STENT BILIARY/ PANCREATIC DUCT - EACH STENT EXCHANGED N/A 03/30/2022    Performed by Eliott Nine, MD at [facility] ENDO    COLON SURGERY  7/20    COLON SURGERY  7/20    HX APPENDECTOMY  1/10    Guess on date    HX CESAREAN SECTION      HX HYSTERECTOMY  7/20    HX LOWER ANTERIOR RESECTION OF COLON      7/20    HX SALPINGO-OOPHORECTOMY      ILEOSTOMY OR JEJUNOSTOMY Right     LIVER DONOR SURGERY  12/24/21    PORTACATH PLACEMENT  1/20    PR PNCRTECT PROX STOT W/PANCREATOJEJUNOSTOMY  7/20    TUNNELED VENOUS PORT PLACEMENT Left     URETER STENT PLACEMENT Bilateral      Family History   Problem Relation Age of Onset    Diabetes Mother     Cancer Father     Diabetes Brother     Cancer-Lung Paternal Aunt     Cancer-Breast Maternal Grandmother     Diabetes Paternal Grandmother      Social History     Socioeconomic History    Marital status: Married   Tobacco Use    Smoking status: Never    Smokeless tobacco: Never   Vaping Use    Vaping status: Never Used   Substance and Sexual Activity    Alcohol use: No     Comment: rarely    Drug use: No         OBJECTIVE:          apap-maalox-lidocaine 1:3:1 oral suspension (MIXTURE COMPOUND) Take 30 mL by mouth every 4 hours as needed.    calcium carbonate (TUMS) 500 mg (200 mg elemental calcium) chewable tablet Chew one tablet by mouth every 6 hours as needed.    carvediloL (COREG) 6.25 mg tablet Take one tablet by mouth twice daily. Take with food.  Hold if systolic blood pressure < 110    Cetirizine (ZYRTEC) 10 mg cap Take one capsule by mouth as Needed.    ciprofloxacin (CIPRO) 250 mg tablet Take one tablet by mouth every 72 hours.    dexAMETHasone (DECADRON) 4 mg tablet 1 tab by mouth with breakfast and afternoon snack on days 2 & 3 following each chemotherapy cycle. Do not take after 6 pm to avoid insomnia.    diphenoxylate-atropine (LOMOTIL) 2.5-0.025 mg tablet Take one tablet by mouth four times daily as needed for Diarrhea.    esomeprazole DR (NEXIUM) 20  mg capsule Take one capsule by mouth daily. Take on an empty stomach at least 1 hour before or 2 hours after food.    loperamide (IMODIUM A-D) 2 mg capsule Take one pill by mouth four times daily as needed    magnesium oxide 400 mg magnesium tablet Take one tablet by mouth twice daily. (Patient taking differently: Take one tablet by mouth twice daily as needed.)    ondansetron (ZOFRAN ODT) 8 mg rapid dissolve tablet Dissolve one tablet by mouth every 8 hours as needed for Nausea or Vomiting. Place on tongue to disolve.    potassium chloride (KLOR-CON SPRINKLE) 10 mEq capsule Take one capsule by mouth twice daily. If potassium < 3.5. Take with a meal and a full glass of water.    psyllium husk (METAMUCIL) 0.4 gram cap Take one capsule by mouth daily as needed.    sod chlor,sod bicarb/neti pot (SINUS WASH NETI POT SINI) Use as directed    sodium chloride 0.9% (NS) 0.9 % 1,000 mL with potassium chloride 2 mEq/mL 20 mEq, magnesium sulfate 4 mEq/mL (50 %) 2 g IV infusion Administer 1L NS with K and 2g Mg once weekly via outpatient infusion.    traMADoL (ULTRAM) 50 mg tablet Take one tablet to two tablets by mouth daily as needed for Pain.     Vitals:    07/05/22 1002   BP: 136/80   BP Source: Arm, Left Upper   Pulse: 91   Temp: 36.9 ?C (98.4 ?F)   Resp: 16   SpO2: 100%   TempSrc: Temporal   PainSc: Four   Weight: 49.9 kg (110 lb)   Height: 154.9 cm (5' 1)         Body mass index is 20.78 kg/m?Marland Kitchen     Pain Score: Four  Pain Loc: Back    Pain Addressed:  N/A    Patient Evaluated for a Clinical Trial: Patient not eligible for a treatment trial (including not needing treatment, needs palliative care, in remission).     [place] Cooperative Oncology Group performance status is 1, Restricted in physically strenuous activity but ambulatory and able to carry out work of a light or sedentary nature, e.g., light house work, office work.       Physical Exam  Vitals reviewed. Constitutional:       General: She is not in acute distress.     Appearance: Normal appearance. She is well-developed. She is not ill-appearing, toxic-appearing or diaphoretic.      Comments: Hair thinning.    HENT:      Head: Normocephalic and atraumatic.      Nose: Nose normal. No rhinorrhea.      Mouth/Throat:      Mouth: Mucous membranes are moist. Mucous membranes are not pale. No oral lesions.      Pharynx: Oropharynx is clear. No oropharyngeal exudate or posterior oropharyngeal erythema.      Tonsils: No tonsillar abscesses.   Eyes:      General: No scleral icterus.        Right eye: No discharge.         Left eye: No discharge.      Extraocular Movements: Extraocular movements intact.      Conjunctiva/sclera: Conjunctivae normal.      Pupils: Pupils are equal, round, and reactive to light.   Cardiovascular:      Rate and Rhythm: Normal rate and regular rhythm.      Pulses: Normal pulses.  Heart sounds: Normal heart sounds. No murmur heard.     No gallop.   Pulmonary:      Effort: Pulmonary effort is normal. No respiratory distress.      Breath sounds: No stridor. No wheezing, rhonchi or rales.   Chest:      Chest wall: No tenderness.      Comments: Port site WNL  Abdominal:      General: A surgical scar is present. The ostomy site is clean. Bowel sounds are normal. There is no distension.      Palpations: There is no mass.      Tenderness: There is no abdominal tenderness. There is no guarding or rebound.      Hernia: No hernia is present.          Comments: Firmness in epigastric area.    Musculoskeletal:         General: No swelling, tenderness, deformity or signs of injury. Normal range of motion.      Cervical back: Normal range of motion and neck supple. No rigidity. No muscular tenderness.      Right lower leg: No edema.      Left lower leg: No edema.   Lymphadenopathy:      Cervical: No cervical adenopathy.   Skin:     General: Skin is warm and dry.      Coloration: Skin is not jaundiced or pale.      Findings: No bruising, erythema, lesion or rash.   Neurological:      General: No focal deficit present.      Mental Status: She is alert and oriented to person, place, and time. Mental status is at baseline.      Cranial Nerves: No cranial nerve deficit.      Motor: No weakness.      Coordination: Coordination normal.      Gait: Gait normal.   Psychiatric:         Mood and Affect: Mood normal.         Behavior: Behavior normal.         Thought Content: Thought content normal.         Judgment: Judgment normal.            CBC w/DIFF      Latest Ref Rng & Units 07/05/2022     9:17 AM 06/21/2022     7:27 AM 06/07/2022     8:09 AM 05/24/2022     7:15 AM 05/10/2022     7:56 AM   CBC with Diff   White Blood Cells 4.5 - 11.0 K/UL 5.6  11.7  7.5  8.8  7.6    RBC 4.0 - 5.0 M/UL 2.26  2.66  2.08  2.55  2.44    Hemoglobin 12.0 - 15.0 GM/DL 7.8  9.1  7.5  9.0  8.7    Hematocrit 36 - 45 % 23.6  28.0  22.2  27.0  25.5    MCV 80 - 100 FL 104.5  105.4  106.9  105.6  104.7    MCH 26 - 34 PG 34.6  34.4  36.1  35.2  35.6    MCHC 32.0 - 36.0 G/DL 16.1  09.6  04.5  40.9  34.0    RDW 11 - 15 % 16.5  16.7  16.9  15.9  15.0    Platelet Count 150 - 400 K/UL 420  437  450  450  262    MPV 7 -  11 FL 7.4  7.5  7.4  7.6  8.8    Neutrophils 41 - 77 % 26  56  36   67    Absolute Neutrophil Count 1.8 - 7.0 K/UL 1.50  6.60  2.70   5.10    Lymphocytes 24 - 44 % 36  20  31   17     Absolute Lymph Count 1.0 - 4.8 K/UL 2.00  2.30  2.30   1.30    Monocytes 4 - 12 % 30  21  30   12     Absolute Monocyte Count 0 - 0.80 K/UL 1.70  2.50  2.30   0.90    Eosinophils 0 - 5 % 7  2  2   4     Absolute Eosinophil Count 0 - 0.45 K/UL 0.40  0.30  0.10   0.30    Basophils 0 - 2 % 1  1  1    0    Absolute Basophil Count 0 - 0.20 K/UL 0.00  0.10  0.00   0.00      Comprehensive Metabolic Profile      Latest Ref Rng & Units 07/05/2022     9:17 AM 06/21/2022     7:27 AM 06/07/2022     8:09 AM 05/24/2022     7:15 AM 05/10/2022     7:56 AM   CMP   Sodium 137 - 147 MMOL/L 138 140  135  139  138    Potassium 3.5 - 5.1 MMOL/L 4.4  3.4  4.1  3.9  3.6    Chloride 98 - 110 MMOL/L 109  95  107  103  105    CO2 21 - 30 MMOL/L 21  34  21  28  24     Anion Gap 3 - 12 8  11  7  8  9     Blood Urea Nitrogen 7 - 25 MG/DL 23  25  24  19  31     Creatinine 0.4 - 1.00 MG/DL 1.61  0.96  0.45  4.09  1.89    Glucose 70 - 100 MG/DL 811  914  782  956  213    Calcium 8.5 - 10.6 MG/DL 8.5  8.8  7.9  8.7  8.2    Total Protein 6.0 - 8.0 G/DL 6.4  7.0  6.2  6.8  6.5    Albumin 3.5 - 5.0 G/DL 3.3  3.5  3.3  3.5  3.5    Alk Phosphatase 25 - 110 U/L 169  247  183  377  163    ALT (SGPT) 7 - 56 U/L 13  23  13   62  20    Total Bilirubin 0.3 - 1.2 MG/DL 0.5  0.6  0.5  0.7  0.9      Tumor Markers  Lab Results   Component Value Date    CEA 1,042.3 (H) 07/05/2022    CEA 1,175.5 (H) 06/21/2022    CEA 1,035.5 (H) 06/07/2022    CEA 1,073.0 (H) 05/24/2022    CEA 1,017.9 (H) 05/10/2022    CEA 1,003.3 (H) 04/27/2022    CEA 1,144.3 (H) 03/21/2022    CEA 838.9 (H) 02/28/2022    CEA 1,020.1 (H) 02/13/2022    CEA 911.3 (H) 01/30/2022    CA199 <1 07/05/2022    CA199 <1 06/21/2022    CA199 <1 06/07/2022    CA199 6 05/24/2022    CA199 <1 05/10/2022    CA199 <1 04/27/2022  CA199 <1 03/21/2022    CA199 <1 02/28/2022    CA199 <1 02/13/2022    CA199 <1 01/30/2022    CA125 41 (H) 01/21/2018    CA125 42 (H) 11/21/2017    CA125 46 (H) 11/05/2017    CA125 41 (H) 10/22/2017    CA125 34 10/08/2017    CA125 34 09/24/2017    CA125 31 09/10/2017    CA125 29 08/27/2017    CA125 34 08/15/2017    CA125 39 (H) 07/30/2017           Imaging:  - 04/27/21 CT CAP without contrast  CHEST:   1.  No significant change in small indeterminate anterior epiphrenic lymph nodes. No new thoracic lymphadenopathy.   2. No significant change to further mild increase in size of pulmonary and pleural metastases.   3. Increasing nodular and tree-in-bud opacities in the lingula and development of mild focal consolidation in the left lower lobe, indeterminate and may be infectious/inflammatory nodules or potentially pulmonary metastases.       ABDOMEN AND PELVIS:   1.  Grossly unchanged extensive peritoneal carcinomatosis.   2. No significant change in small residual indeterminate retroperitoneal lymph nodes. No discrete new abdominopelvic lymphadenopathy.   3. Grossly unchanged right anterior abdominal wall metastases.     CT ABD/PELV WO CONTRAST  Narrative: CT CHEST, ABDOMEN AND PELVIS    Clinical Indication: Female, 67 years old. Cancer of appendix. Peritoneal carcinomatosis.    Technique: Multiple contiguous axial images were obtained through the chest, abdomen and pelvis without IV contrast material. Post processing coronal and sagittal reconstruction images were made from the axial images.      IV contrast: None  Bowel contrast:  None    Comparison: Noncontrast CT chest, abdomen, and pelvis on 12/12/2021.    CHEST FINDINGS:    Evaluation of the mediastinum and hila, including the vasculature and for lymphadenopathy, is limited without the use of IV contrast.    Lower Neck: Unremarkable.    Axilla, Mediastinum and Hila: No thoracic lymphadenopathy. Unchanged small anterior diaphragmatic and lower paraesophageal lymph nodes that are indeterminate.     Heart and Great Vessels: Left subclavian chest port terminates in the SVC. Heart size normal. Coronary artery calcifications. Thoracic aorta normal in caliber. Mild calcified atherosclerotic plaque.    Airway, Lungs and Pleura: Several areas of peripheral mucous plugging, for example in the lingula. Improved previously new pulmonary opacities in the right lower lobe. No pleural effusion. No significant change in size of pulmonary and pleural metastases. Previously measured metastases as follows:    Right upper lobe, 2.3 cm (series 2 image 13) previously 2.3 cm.  Right lower lobe, 0.9 cm (series 2 image 38) previously 0.9 cm.    Chest Wall and Osseous Structures: No destructive osseous lesion.     Abdomen and Pelvis Findings:    Limited evaluation without the use of IV contrast which includes the viscera and vasculature.    Liver and Biliary system: Liver is normal in size. Persistent scalloping of the hepatic margins from peritoneal metastases. Unchanged right hepatic cyst. No obvious new parenchymal hepatic mass on noncontrast CT. Indwelling biliary stents. Persistent mild intrahepatic biliary ductal dilatation and pneumobilia.     Spleen: Splenectomy.    Adrenal Glands and Kidneys: Adrenal glands unremarkable. Several subcentimeter renal calculi. No hydronephrosis.    Pancreas and Retroperitoneum: Unopacified pancreas is partially obscured due to extensive peritoneal metastases. No significant change in small indeterminate retroperitoneal lymph nodes. No obvious new lymphadenopathy.  Aorta and Major Vessels: Normal caliber abdominal aorta. Mild calcified atherosclerotic plaque.     Bowel, Mesentery and Peritoneal space: Distal colonic anastomosis and right lower quadrant ostomy. Omentectomy. No bowel obstruction. Grossly unchanged extensive peritoneal metastases, many of which are partially calcified.    Pelvis: Hysterectomy. Persistent gas distention of the vagina with trace layering fluid. Urinary bladder is incompletely distended and not well evaluated. No discrete pelvic lymphadenopathy.    Abdominal wall and Osseous Structures: Midline ventral abdominal incision scar. Grossly unchanged right anterior abdominal wall metastases. Previously measured subcutaneous right ventral abdominal wall metastasis measures 2.1 x 1.6 cm (series 2 image 139) previously 2.1 x 1.6 cm.    Unchanged mild superior endplate compression deformity at L1. Development of mild superior endplate compression deformity at L3. Grossly unchanged asymmetric marrow in the left sacrum (series 2 image 174).  Impression: CHEST:  1. No significant change in small indeterminate anterior diaphragmatic and paraesophageal lymph nodes. No new thoracic lymphadenopathy.    2. No significant change in pulmonary and pleural metastases.    ABDOMEN AND PELVIS:  1. Grossly unchanged extensive peritoneal carcinomatosis and right abdominal wall metastases.    2. No new abdominopelvic lymphadenopathy.    3. Development of mild superior endplate compression deformity at L3. Unchanged mild superior endplate compression deformity at L1.    4. Unchanged asymmetric marrow in the left sacrum, nonspecific and incompletely characterized on this exam but may be from osseous metastasis.     Finalized by Ancil Boozer, M.D. on 03/30/2022 12:44 PM. Dictated by Ancil Boozer, M.D. on 03/30/2022 12:14 PM.  CT CHEST WO CONTRAST  Narrative: CT CHEST, ABDOMEN AND PELVIS    Clinical Indication: Female, 67 years old. Cancer of appendix. Peritoneal carcinomatosis.    Technique: Multiple contiguous axial images were obtained through the chest, abdomen and pelvis without IV contrast material. Post processing coronal and sagittal reconstruction images were made from the axial images.      IV contrast: None  Bowel contrast:  None    Comparison: Noncontrast CT chest, abdomen, and pelvis on 12/12/2021.    CHEST FINDINGS:    Evaluation of the mediastinum and hila, including the vasculature and for lymphadenopathy, is limited without the use of IV contrast.    Lower Neck: Unremarkable.    Axilla, Mediastinum and Hila: No thoracic lymphadenopathy. Unchanged small anterior diaphragmatic and lower paraesophageal lymph nodes that are indeterminate.     Heart and Great Vessels: Left subclavian chest port terminates in the SVC. Heart size normal. Coronary artery calcifications. Thoracic aorta normal in caliber. Mild calcified atherosclerotic plaque.    Airway, Lungs and Pleura: Several areas of peripheral mucous plugging, for example in the lingula. Improved previously new pulmonary opacities in the right lower lobe. No pleural effusion. No significant change in size of pulmonary and pleural metastases. Previously measured metastases as follows:    Right upper lobe, 2.3 cm (series 2 image 13) previously 2.3 cm.  Right lower lobe, 0.9 cm (series 2 image 38) previously 0.9 cm.    Chest Wall and Osseous Structures: No destructive osseous lesion.     Abdomen and Pelvis Findings:    Limited evaluation without the use of IV contrast which includes the viscera and vasculature.    Liver and Biliary system: Liver is normal in size. Persistent scalloping of the hepatic margins from peritoneal metastases. Unchanged right hepatic cyst. No obvious new parenchymal hepatic mass on noncontrast CT. Indwelling biliary stents. Persistent mild intrahepatic biliary ductal dilatation and pneumobilia.  Spleen: Splenectomy.    Adrenal Glands and Kidneys: Adrenal glands unremarkable. Several subcentimeter renal calculi. No hydronephrosis.    Pancreas and Retroperitoneum: Unopacified pancreas is partially obscured due to extensive peritoneal metastases. No significant change in small indeterminate retroperitoneal lymph nodes. No obvious new lymphadenopathy.    Aorta and Major Vessels: Normal caliber abdominal aorta. Mild calcified atherosclerotic plaque.     Bowel, Mesentery and Peritoneal space: Distal colonic anastomosis and right lower quadrant ostomy. Omentectomy. No bowel obstruction. Grossly unchanged extensive peritoneal metastases, many of which are partially calcified.    Pelvis: Hysterectomy. Persistent gas distention of the vagina with trace layering fluid. Urinary bladder is incompletely distended and not well evaluated. No discrete pelvic lymphadenopathy.    Abdominal wall and Osseous Structures: Midline ventral abdominal incision scar. Grossly unchanged right anterior abdominal wall metastases. Previously measured subcutaneous right ventral abdominal wall metastasis measures 2.1 x 1.6 cm (series 2 image 139) previously 2.1 x 1.6 cm.    Unchanged mild superior endplate compression deformity at L1. Development of mild superior endplate compression deformity at L3. Grossly unchanged asymmetric marrow in the left sacrum (series 2 image 174).  Impression: CHEST:  1. No significant change in small indeterminate anterior diaphragmatic and paraesophageal lymph nodes. No new thoracic lymphadenopathy.    2. No significant change in pulmonary and pleural metastases.    ABDOMEN AND PELVIS:  1. Grossly unchanged extensive peritoneal carcinomatosis and right abdominal wall metastases.    2. No new abdominopelvic lymphadenopathy.    3. Development of mild superior endplate compression deformity at L3. Unchanged mild superior endplate compression deformity at L1.    4. Unchanged asymmetric marrow in the left sacrum, nonspecific and incompletely characterized on this exam but may be from osseous metastasis.     Finalized by Ancil Boozer, M.D. on 03/30/2022 12:44 PM. Dictated by Ancil Boozer, M.D. on 03/30/2022 12:14 PM.  GI ENDO CATH BIL & PANC DUCT  This order has been auto finalized and does not contain a result.        ASSESSMENT AND PLAN:    1. Metastatic appendiceal carcinoma. Signet ring adenocarcinoma, MSS, with peritoneal carcinomatosis with  pseudomyxoma peritonei.  02/19/17 diagnostic laparoscopy with biopsies of the right diaphragm, right liver, right fallopian tube and left pelvis.  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  Planned for exploratory laparotomy with CRS/HIPEC in combination with a TAH/BSO in 03/2017.  Developed intestinal obstruction and taken to OR for CRS/HIPEC surgery; however multiple adhesions, omental cracking. CRS/HIPEC was aborted.  Immunotherapy on epacadostat + sirolimus trial 12/24/17-05/21/18.  She stopped study drug on 05/21/18 for mitomycin HIPEC trial at [facility] [place].  On 05/28/18 diagnostic laparoscopy, evacuation of mucinous ascites 1.5L, peritoneal biopsy, laparoscopic HIPEC with mitomycin-C in [place] by Dr. Daryl [facility].  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  Completed second laparoscopic HIPEC on 06/25/18.  Again 1.5L removed.  09/12/18 underwent debulking at [facility] in [place], [place].  Removed 98% by Dr. [name].  Discharged on 09/30/18.  She had L pleural effusion requiring chest tube drainage; intra-abd abscess 10/2018; discharged on 11/04/18 with drain and IV cefepime and Diflucan. Her ureteral stents were removed and ostomy relocated in 10/2018.  Hospitalized in 01/2019 for AKI 2/2 dehydration and likely PE (V/Q scan and trop elevation). CEA increasing again. Repeat CT CAP 03/20/19 with stable to slight increase in cancer and RUL nodule.  Discussed observation vs treatment. She chose observation and is considering repeat CRS + HIPEC via Dr. [name]  in [place].  CT scans from 09/24/19 of the abdomen, without contrast, appear stable.  The peritoneal thickening, carcinomatosis, retroperitoneal lymph nodes, a small amount of fluid collection or trace ascites, appear to be stable compared to 3 months ago. 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  CT scan on 08/17/20 showed a right lung mass that is slightly increased from 11 mm to 14 mm. There are some new lung nodules that have developed, a small one in the left lung, a new nodule in the right lung. Inside the abdomen we are seeing continued increase in size of the peritoneal nodules. The largest measuring 14 mm compared to 12 mm.  The nodule lower in the pelvis increased from 16 mm to 21 mm.  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  TMB = 1.3.  S/p CRS w/ ostomy revision and tumor removal around stoma on 10/14/20.  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 [name] PA from [facility] in [place] called to relay their TB recommendations for patient.  Recommendations to proceed with FOLFIRI/Avastin with holding Avastin 6-8 weeks post-op. They also request if our office can repeat genomic testing.  Repeat TEMPUS from 10/14/20.  Pre-screen for KRAS G12D inhibitor trial, if not eligible will plan for FOLFIRI +/- bevacizumab. Tolerated C1 FOLFIRI but more diarrhea and N/V after C2. Decreased irinotecan to 130 mg/m2 due to diarrhea. S/p C4, with bevacizumab added on. CT scan 02/16/21 abdominal wall spots are under control and stable. There is a slight increase in size of the lung nodules. The radiologist is measuring 10 mm to 8 mm in 11/2020. The other right lung nodule is more or less the same at 15 mm. CT scans with stable disease, mild increase in size of pulmonary metastases (specifically the right upper lobe nodule 2.2cm, previously 1.5cm). There is also some increasing nodular and tree-in-bud opacities in the lingula and development of mild focal consolidation in the left lower lobe, indeterminate but may be related to recent COVID infection vs pulmonary metastases. Given overall stability and down-trending CEA, we will plan to continue with the current treatment regimen for now. Dr. Dorita Fray with Radiation Oncology reviewed imaging (already received 50Gy to right lung 3/1-05/25/20) and will continue to monitor. CT scan on 08/03/2021 shows the cancer inside the abdomen and the abdominal wall metastasis is remaining the same compared to the scans done in 04/2021. The lung metastasis appears slightly larger by about 2 mm. Abnormal labs, even on repeat. Chemo held. Ultrasound reveals moderate ductal dilatation. Refer to GI for ERCP. Continued elevation of liver enzymes and bilirubin. MRI MRCP performed on 09/07/2021 shows extrinsic compression of the common bile duct causing biliary dilatation, as well as both intrahepatic ducts on the right and left side. CT scan 09/28/2021 shows the cancer was overall stable. However, the fact that it is causing biliary obstruction, ureter obstruction, and hydronephrosis tells [place] that clinically it is growing. There was mild hydronephrosis on both kidneys. cycle 18 of FOLFIRI,  stent replacement in 3 weeks, will hold bevacizumab. In October, re-discuss decreasing her Irinotecan to every 4 weeks if no worsening of labs and/or symptoms. CT scan on 12/12/2021 shows the peritoneal mass is stable. The dilation of the bile ducts has improved. The nodules in the lung are stable. There are new cluster of nodules which appear like inflammation. CT scan of the chest, abdomen, and pelvis performed on  03/30/2022, when compared shows that her lung nodules, abdominal wall, tumor in the abdomen, and the peritoneal carcinomatosis appears the same.      Plan 07/05/22:       Patient reports more fatigue after the last treatment, with irinotecan added back in to regimen.  decrease dose of 5-FU to 2200 mg/m2 for fatigue and diarrhea.   proceed with C today and C in 4 weeks.   imaging after next two doses, then RTC to review with Dr. Josiah Lobo  -Administer additional magnesium today.    Back pain: Patient reports acute back pain after lifting heavy objects.  -Advise patient to avoid heavy lifting and strenuous activities.     Urinary issues: recurrent UTIs. follows with an outside urologist. recently consulted with Dr. Marjie Skiff but has not had any adjustments in her medications. Patient is currently on Cipro every third day. Stable.   -Continue current management and monitor for changes.    Anemia: Hemoglobin is lower today, possibly due to recent viral infection and/or chemotherapy.  -Consider transfusion if hemoglobin drops to 7 or if patient's symptoms worsen.    Renal function: Creatinine is elevated (2.5) but slightly better than 2.6-2.89, possibly due to recent viral infection and/or dehydration from diarrhea. she had IVF and mag at home Monday. Stable at 2.34 today. Check creatinine level at next lab visit.     Diarrhea. Better with Lomotil daily. Continue IV fluids 3 times a week locally. Increased her Lomotil to 2 tablets 4 times daily. She will continue to take Metamucil to bulk up her stools. She had a day of decreased stool output but was able to flush the Metamucil out with an enema. Bowels are back to baseline now. Continue to take Imodium as needed, okay to increase to 2 tabs at a time as needed. Better with advanced diet. Obstruction with too much fiber. Better with break in chemo. Monitor. IVF as needed throughout week. Improved. Continue Imodium and Lomotil as needed.     Hypomagnesemia. Due to large volume fluid loss. 1.2 today. Replace in treatment 2 gms. She is on oral supplements at home Mag oxide as well     Elevated LFTs. May be irinotecan, tylenol, cipro. Hold tylenol. Continue antibiotics. Improved after holding tylenol. Now elevated again with bilirubin 2.7, AST 66 and ALT 106. Some improvement after IVF but bilirubin still elevated at 2.3. ultrasound of abdomen to assess for biliary obstruction/dilation. This revealed moderate intrahepatic bile duct dilatation. 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 Refer to GI for possible ERCP. Stent placed. Now bili higher at 4.1 and LFTs higher. White count elevated. Admitted to hospital for repeat ERCP. They were able to extend stent, no PTC. Bilirubin now 1.4. ok to resume treatment. Repeat scope 9/7. WNL today. Replace in April. Now normal. Chemo and imaging as above.       Turner Daniels, APRN-NP          The patient and family were allowed to ask questions and voice concerns; these were addressed to the best of our ability. They expressed understanding of what was explained to them, and they agreed with the present plan. Patient has the phone numbers for the Cancer Center and was instructed on how to contact us with any questions or concerns. My collaborating physician on this case is Dr. Josiah Lobo.

## 2022-07-06 ENCOUNTER — Encounter: Admit: 2022-07-06 | Discharge: 2022-07-06 | Payer: MEDICARE

## 2022-07-06 DIAGNOSIS — C189 Malignant neoplasm of colon, unspecified: Secondary | ICD-10-CM

## 2022-07-06 DIAGNOSIS — C801 Malignant (primary) neoplasm, unspecified: Secondary | ICD-10-CM

## 2022-07-06 DIAGNOSIS — K219 Gastro-esophageal reflux disease without esophagitis: Secondary | ICD-10-CM

## 2022-07-06 DIAGNOSIS — I454 Nonspecific intraventricular block: Secondary | ICD-10-CM

## 2022-07-06 DIAGNOSIS — C181 Malignant neoplasm of appendix: Secondary | ICD-10-CM

## 2022-07-18 ENCOUNTER — Encounter: Admit: 2022-07-18 | Discharge: 2022-07-18 | Payer: MEDICARE

## 2022-07-19 ENCOUNTER — Encounter: Admit: 2022-07-19 | Discharge: 2022-07-19 | Payer: MEDICARE

## 2022-07-19 DIAGNOSIS — C786 Secondary malignant neoplasm of retroperitoneum and peritoneum: Secondary | ICD-10-CM

## 2022-07-19 DIAGNOSIS — R970 Elevated carcinoembryonic antigen [CEA]: Secondary | ICD-10-CM

## 2022-07-19 LAB — CBC AND DIFF
ABSOLUTE BASO COUNT: 0 K/UL (ref 0–0.20)
ABSOLUTE EOS COUNT: 0.1 K/UL (ref 0–0.45)
ABSOLUTE LYMPH COUNT: 2.4 K/UL (ref 1.0–4.8)
ABSOLUTE MONO COUNT: 2.1 K/UL — ABNORMAL HIGH (ref 0–0.80)
ABSOLUTE NEUTROPHIL: 1.8 K/UL (ref 1.8–7.0)
BASOPHILS %: 1 % (ref 0–2)
EOSINOPHILS %: 1 % — ABNORMAL LOW (ref >60)
HEMATOCRIT: 24 % — ABNORMAL LOW (ref 36–45)
LYMPHOCYTES %: 38 % (ref 24–44)
MCH: 35 pg — ABNORMAL HIGH (ref 26–34)
MONOCYTES %: 32 % — ABNORMAL HIGH (ref 4–12)
MPV: 7.7 FL (ref 7–11)
NEUTROPHILS %: 28 % — ABNORMAL LOW (ref 41–77)
PLATELET COUNT: 493 K/UL — ABNORMAL HIGH (ref 150–400)
RBC COUNT: 2.3 M/UL — ABNORMAL LOW (ref 4.0–5.0)
RDW: 17 % — ABNORMAL HIGH (ref 11–15)
WBC COUNT: 6.4 K/UL (ref 4.5–11.0)

## 2022-07-19 LAB — COMPREHENSIVE METABOLIC PANEL
POTASSIUM: 4 MMOL/L (ref 3.5–5.1)
SODIUM: 137 MMOL/L (ref 137–147)

## 2022-07-19 LAB — CEA(CARCINOEMBRYONIC AG): CEA: 91 ng/mL — ABNORMAL HIGH (ref <3.0)

## 2022-07-19 LAB — CA19.9: CA 19-9: <1 U/mL — ABNORMAL LOW (ref <35)

## 2022-07-19 LAB — MAGNESIUM: MAGNESIUM: 1.1 mg/dL — ABNORMAL LOW (ref 1.6–2.6)

## 2022-07-19 MED ORDER — DEXAMETHASONE 4 MG PO TAB
4 mg | Freq: Once | ORAL | 0 refills | Status: CP
Start: 2022-07-19 — End: ?
  Administered 2022-07-19: 13:00:00 4 mg via ORAL

## 2022-07-19 MED ORDER — IRINOTECAN IVPB
110 mg/m2 | Freq: Once | INTRAVENOUS | 0 refills | Status: CP
Start: 2022-07-19 — End: ?
  Administered 2022-07-19 (×3): 166.2 mg via INTRAVENOUS

## 2022-07-19 MED ORDER — PALONOSETRON 0.25 MG/5 ML IV SOLN
.25 mg | Freq: Once | INTRAVENOUS | 0 refills | Status: CP
Start: 2022-07-19 — End: ?
  Administered 2022-07-19: 13:00:00 0.25 mg via INTRAVENOUS

## 2022-07-19 MED ORDER — ATROPINE 0.4 MG/ML IJ SOLN
.4 mg | INTRAVENOUS | 0 refills | Status: AC | PRN
Start: 2022-07-19 — End: ?
  Administered 2022-07-19: 14:00:00 0.4 mg via INTRAVENOUS

## 2022-07-19 MED ORDER — SODIUM CHLORIDE 0.9 % IV SOLP
1000 mL | Freq: Once | INTRAVENOUS | 0 refills | Status: CP
Start: 2022-07-19 — End: ?
  Administered 2022-07-19: 13:00:00 1000 mL via INTRAVENOUS

## 2022-07-19 MED ORDER — MAGNESIUM SULFATE IN D5W 1 GRAM/100 ML IV PGBK (CC ONLY)
1 g | INTRAVENOUS | 0 refills | Status: CP
Start: 2022-07-19 — End: ?
  Administered 2022-07-19 (×2): 1 g via INTRAVENOUS

## 2022-07-19 MED ORDER — FLUOROURACIL IV AMB PUMP
2200 mg/m2 | Freq: Once | INTRAVENOUS | 0 refills | Status: CP
Start: 2022-07-19 — End: ?
  Administered 2022-07-19 (×3): 3322 mg via INTRAVENOUS

## 2022-07-19 NOTE — Progress Notes
Cancer Therapeutics Note  Verified consent signed and in chart.    Blood return positive via: Port (Single and Power Port)    BSA and dose double checked (agree with orders as written) with: yes see MAR    Labs/applicable tests checked: CBC and Comprehensive Metabolic Panel (CMP)    Treatment regimen: Drug/cycle/day: C31D1  -irinotecan (CAMPTOSAR) 166.2 mg in dextrose 5% (D5W) 508.31 mL IVPB  -fluorouraciL (ADRUCIL) 3,322 mg in sodium chloride 0.9% (NS) 92 mL IV amb pump    Rate verified and armband double check with second RN: yes    Patient education offered and stated understanding. Denies questions at this time.     Pt here for [employer]. PAC flushed with normal saline, good blood return. Mvasi held for procedure per Lianne Moris APRN. Pt tolerated IVF, Mag, and irinotecan infusion without incident.  Port flushed, positive for good blood return and connected to ambulatory pump- 5FU running via cadd pump. Pt left ambulatory, no further questions or concerns.

## 2022-07-27 ENCOUNTER — Encounter: Admit: 2022-07-27 | Discharge: 2022-07-27 | Payer: MEDICARE

## 2022-07-27 ENCOUNTER — Ambulatory Visit: Admit: 2022-07-27 | Discharge: 2022-07-27 | Payer: MEDICARE

## 2022-07-27 DIAGNOSIS — K219 Gastro-esophageal reflux disease without esophagitis: Secondary | ICD-10-CM

## 2022-07-27 DIAGNOSIS — K831 Obstruction of bile duct: Secondary | ICD-10-CM

## 2022-07-27 DIAGNOSIS — C181 Malignant neoplasm of appendix: Secondary | ICD-10-CM

## 2022-07-27 DIAGNOSIS — Z4689 Encounter for fitting and adjustment of other specified devices: Secondary | ICD-10-CM

## 2022-07-27 DIAGNOSIS — C786 Secondary malignant neoplasm of retroperitoneum and peritoneum: Secondary | ICD-10-CM

## 2022-07-27 DIAGNOSIS — C801 Malignant (primary) neoplasm, unspecified: Secondary | ICD-10-CM

## 2022-07-27 DIAGNOSIS — C189 Malignant neoplasm of colon, unspecified: Secondary | ICD-10-CM

## 2022-07-27 DIAGNOSIS — I454 Nonspecific intraventricular block: Secondary | ICD-10-CM

## 2022-07-27 MED ORDER — LIDOCAINE (PF) 200 MG/10 ML (2 %) IJ SYRG
INTRAVENOUS | 0 refills | Status: DC
Start: 2022-07-27 — End: 2022-07-27

## 2022-07-27 MED ORDER — PROPOFOL INJ 10 MG/ML IV VIAL
INTRAVENOUS | 0 refills | Status: DC
Start: 2022-07-27 — End: 2022-07-27

## 2022-07-27 MED ORDER — SUCCINYLCHOLINE CHLORIDE 20 MG/ML IJ SOLN
INTRAVENOUS | 0 refills | Status: DC
Start: 2022-07-27 — End: 2022-07-27

## 2022-07-27 MED ADMIN — ONDANSETRON HCL (PF) 4 MG/2 ML IJ SOLN [136012]: 4 mg | INTRAVENOUS | @ 19:00:00 | Stop: 2022-07-27 | NDC 72266012301

## 2022-07-27 MED ADMIN — SODIUM CHLORIDE 0.9 % IV SOLP [27838]: 5 mL | INTRAVENOUS | @ 18:00:00 | Stop: 2022-07-27 | NDC 00338004903

## 2022-07-27 NOTE — Anesthesia Post-Procedure Evaluation
Post-Anesthesia Evaluation    Name: Vicki Buchanan      MRN: [id number]     DOB: April 15, 1955     Age: 67 y.o.     Sex: female   __________________________________________________________________________     Procedure Information       Anesthesia Start Date/Time: 07/27/22 1211    Procedure: ENDOSCOPIC RETROGRADE CHOLANGIOPANCREATOGRAPHY WITH REMOVAL AND EXCHANGE OF STENT BILIARY/ PANCREATIC DUCT - EACH STENT EXCHANGED    Location: ENDO 1 / ENDO/GI    Surgeons: Eliott Nine, MD            Post-Anesthesia Vitals  BP: 161/81 (05/09 1415)  Pulse: 82 (05/09 1415)  Respirations: 18 PER MINUTE (05/09 1415)  SpO2: 95 % (05/09 1415)  O2 Device: None (Room air) (05/09 1340)   Vitals Value Taken Time   BP 161/81 07/27/22 1415   Temp 36.7 ?C (98.1 ?F) 07/27/22 1320   Pulse 82 07/27/22 1415   Respirations 18 PER MINUTE 07/27/22 1415   SpO2 95 % 07/27/22 1415   O2 Device None (Room air) 07/27/22 1340   ABP     ART BP           Post Anesthesia Evaluation Note    Evaluation location: Pre/Post  Patient participation: recovered; patient participated in evaluation  Level of consciousness: alert  Pain management: adequate    Hydration: normovolemia  Temperature: 36.0?C - 38.4?C  Airway patency: adequate    Perioperative Events       Post-op nausea and vomiting: no PONV    Postoperative Status  Cardiovascular status: hemodynamically stable  Respiratory status: spontaneous ventilation  Follow-up needed: none        Perioperative Events  There were no known complications for this encounter.

## 2022-07-27 NOTE — Anesthesia Pre-Procedure Evaluation
Anesthesia Pre-Procedure Evaluation    Name: Vicki Buchanan      MRN: [id number]     DOB: 1955/07/07     Age: 67 y.o.     Sex: female   _________________________________________________________________________     Procedure Info:   Procedure Information       Date/Time: 07/27/22 1215    Procedures:       ENDOSCOPIC RETROGRADE CHOLANGIOPANCREATOGRAPHY      ENDOSCOPIC RETROGRADE CHOLANGIOPANCREATOGRAPHY WITH REMOVAL AND EXCHANGE OF STENT BILIARY/ PANCREATIC DUCT - EACH STENT EXCHANGED    Location: ENDO 1 / ENDO/GI    Surgeons: Eliott Nine, MD            Physical Assessment  Vital Signs (last filed in past 24 hours):         Patient History   Allergies   Allergen Reactions    Indomethacin SEE COMMENTS     Pain for several days after suppository post ERCP. Tolerated IV but not PR    Shrimp SEE COMMENTS     Eye irritation. Namely blood vessel issues in the eyes         Current Medications    Medication Directions   apap-maalox-lidocaine 1:3:1 oral suspension (MIXTURE COMPOUND) Take 30 mL by mouth every 4 hours as needed.   calcium carbonate (TUMS) 500 mg (200 mg elemental calcium) chewable tablet Chew one tablet by mouth every 6 hours as needed.   carvediloL (COREG) 6.25 mg tablet Take one tablet by mouth twice daily. Take with food.  Hold if systolic blood pressure < 110   Cetirizine (ZYRTEC) 10 mg cap Take one capsule by mouth as Needed.   ciprofloxacin (CIPRO) 250 mg tablet Take one tablet by mouth every 72 hours.   dexAMETHasone (DECADRON) 4 mg tablet 1 tab by mouth with breakfast and afternoon snack on days 2 & 3 following each chemotherapy cycle. Do not take after 6 pm to avoid insomnia.   diphenoxylate-atropine (LOMOTIL) 2.5-0.025 mg tablet Take one tablet by mouth four times daily as needed for Diarrhea.   esomeprazole DR (NEXIUM) 20 mg capsule Take one capsule by mouth daily. Take on an empty stomach at least 1 hour before or 2 hours after food.   loperamide (IMODIUM A-D) 2 mg capsule Take one pill by mouth four times daily as needed   magnesium oxide 400 mg magnesium tablet Take one tablet by mouth twice daily.  Patient taking differently: Take one tablet by mouth twice daily as needed.   ondansetron (ZOFRAN ODT) 8 mg rapid dissolve tablet Dissolve one tablet by mouth every 8 hours as needed for Nausea or Vomiting. Place on tongue to disolve.   potassium chloride (KLOR-CON SPRINKLE) 10 mEq capsule Take one capsule by mouth twice daily. If potassium < 3.5. Take with a meal and a full glass of water.   psyllium husk (METAMUCIL) 0.4 gram cap Take one capsule by mouth daily as needed.   sod chlor,sod bicarb/neti pot (SINUS WASH NETI POT SINI) Use as directed   sodium chloride 0.9% (NS) 0.9 % 1,000 mL with potassium chloride 2 mEq/mL 20 mEq, magnesium sulfate 4 mEq/mL (50 %) 2 g IV infusion Administer 1L NS with K and 2g Mg once weekly via outpatient infusion.   traMADoL (ULTRAM) 50 mg tablet Take one tablet to two tablets by mouth daily as needed for Pain.       Review of Systems/Medical History      Patient summary reviewed  Nursing notes reviewed  Pertinent  labs reviewed    PONV Screening: Non-smoker and Female sex    No family history of anesthetic complications      Airway - negative        Previous grade 1 view with MAC 3      Pulmonary - negative          Lung metastases s/p radiation      Cardiovascular         Exercise tolerance: >4 METS      Beta Blocker therapy: No      Beta blockers within 24 hours: n/a      Hypertension, well controlled                  GI/Hepatic/Renal             GERD, poorly controlled        Liver disease (biliary obstruction):         Renal disease: CKD Stage 3 (eGFR 30-59)           S/p HIPEC, 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        Neuro/Psych - negative        Musculoskeletal - negative        No back pain        Endocrine/Other           No anemia        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       Previous Airway Procedure Notes Displaying the 3 most recent records      Date Mask Difficulty Techninque used for succesful ETT placement Blade Type Blade Size View with successful placement Number of attempts    03/30/22 1 - vent by mask direct laryngoscopy Macintosh 3 grade I - full view of glottis 1    12/22/21 0 - not attempted direct laryngoscopy Macintosh 3 grade I - full view of glottis 1            Patient Lines/Drains/Airways Status       Active Lines:       Name Placement date Placement time Site Days    Implantable Port-a-cath Single Lumen 04/23/17 0845 Subclavian, Left 04/23/17  0845  Subclavian, Left  1921    Ileostomy 04/03/17 0951 Lower Right Quadrant 04/03/17  0951  Lower Right Quadrant  1941                  Diagnostic Tests  Hematology:   Lab Results   Component Value Date    HGB 8.2 07/19/2022    HCT 24.7 07/19/2022    PLTCT 493 07/19/2022    WBC 6.4 07/19/2022    NEUT 28 07/19/2022    ANC 1.80 07/19/2022    LYMPH 44  05/24/2022    ALC 2.40 07/19/2022    MONA 32 07/19/2022    AMC 2.10 07/19/2022    EOSA 1 07/19/2022    ABC 0.00 07/19/2022    MCV 105.3 07/19/2022    MCH 35.0 07/19/2022    MCHC 33.3 07/19/2022    MPV 7.7 07/19/2022    RDW 17.3 07/19/2022         General Chemistry:   Lab Results   Component Value Date    NA 137 07/19/2022    K 4.0 07/19/2022    CL 103 07/19/2022    CO2 23 07/19/2022    GAP 11 07/19/2022    BUN 27 07/19/2022    CR 2.81 07/19/2022    GLU 139 07/19/2022    CA 8.6 07/19/2022    ALBUMIN 3.6 07/19/2022    MG 1.1 07/19/2022    TOTBILI 0.4 07/19/2022    PO4 3.3 01/30/2022 Coagulation:   Lab Results   Component Value Date    PTT 28.7 07/10/2018    INR 1.3 07/10/2018       PAC Plan    Anesthesia Plan    ASA score: 3   Plan: general and RSI  Induction method: intravenous  NPO status: acceptable      Informed Consent  Anesthetic plan and risks discussed with patient.        Plan discussed with: anesthesiologist and CRNA.      Alerts: No

## 2022-07-27 NOTE — Anesthesia Procedure Notes
Procedure: Airway Placement    AIRWAY INSERTION    Date/Time: 07/27/2022 12:15 PM    Patient location: OR  Urgency: elective  Difficult Airway: No            Airway Procedure  Indication(s) for airway management: surgery        Rapid Sequence Induction (RSI) used: yes    Preoxygenated: yes      Mask difficulty assessment: 0 - not attempted      Procedure Outcome  Final airway type: endotracheal airway  Endotracheal airway: ETT          ETT size (mm): 7.0    Devices/methods used in placement: intubating stylet  Laryngoscope/Videolaryngoscope blade size: 3        Measured from: teeth   Depth: 22 cm  Amount of Air in Cuff: 6 ml  Number of attempts at approach: 1  Placement verified by auscultation            Complications  Cardiovascular:   Pulmonary:   Procedure: airway not difficult  Medication:         Performed by: Randall Hiss, MD  Authorized by: Randall Hiss, MD

## 2022-07-29 ENCOUNTER — Encounter: Admit: 2022-07-29 | Discharge: 2022-07-29 | Payer: MEDICARE

## 2022-07-29 DIAGNOSIS — I454 Nonspecific intraventricular block: Secondary | ICD-10-CM

## 2022-07-29 DIAGNOSIS — K219 Gastro-esophageal reflux disease without esophagitis: Secondary | ICD-10-CM

## 2022-07-29 DIAGNOSIS — C189 Malignant neoplasm of colon, unspecified: Secondary | ICD-10-CM

## 2022-07-29 DIAGNOSIS — C181 Malignant neoplasm of appendix: Secondary | ICD-10-CM

## 2022-07-29 DIAGNOSIS — C801 Malignant (primary) neoplasm, unspecified: Secondary | ICD-10-CM

## 2022-07-30 ENCOUNTER — Encounter: Admit: 2022-07-30 | Discharge: 2022-07-30 | Payer: MEDICARE

## 2022-08-02 ENCOUNTER — Encounter: Admit: 2022-08-02 | Discharge: 2022-08-02 | Payer: MEDICARE

## 2022-08-02 DIAGNOSIS — C786 Secondary malignant neoplasm of retroperitoneum and peritoneum: Secondary | ICD-10-CM

## 2022-08-02 DIAGNOSIS — C189 Malignant neoplasm of colon, unspecified: Secondary | ICD-10-CM

## 2022-08-02 DIAGNOSIS — C181 Malignant neoplasm of appendix: Secondary | ICD-10-CM

## 2022-08-02 DIAGNOSIS — C801 Malignant (primary) neoplasm, unspecified: Secondary | ICD-10-CM

## 2022-08-02 DIAGNOSIS — I454 Nonspecific intraventricular block: Secondary | ICD-10-CM

## 2022-08-02 DIAGNOSIS — K219 Gastro-esophageal reflux disease without esophagitis: Secondary | ICD-10-CM

## 2022-08-02 LAB — MAGNESIUM: MAGNESIUM: 1.2 mg/dL — ABNORMAL LOW (ref 1.6–2.6)

## 2022-08-02 LAB — CBC AND DIFF
ABSOLUTE EOS COUNT: 0.1 K/UL (ref 0–0.45)
ABSOLUTE LYMPH COUNT: 2.4 K/UL (ref 1.0–4.8)
ABSOLUTE MONO COUNT: 1.8 K/UL — ABNORMAL HIGH (ref 0–0.80)
ABSOLUTE NEUTROPHIL: 1.8 K/UL (ref 1.8–7.0)
BASOPHILS %: 1 % — ABNORMAL LOW (ref >60)
HEMOGLOBIN: 7.8 g/dL — ABNORMAL LOW (ref 12.0–15.0)
LYMPHOCYTES %: 38 % (ref 24–44)
MCH: 35 pg — ABNORMAL HIGH (ref 26–34)
MCHC: 33 g/dL (ref 32.0–36.0)
MCV: 106 FL — ABNORMAL HIGH (ref 80–100)
MONOCYTES %: 29 % — ABNORMAL HIGH (ref 4–12)
MPV: 7.6 FL — ABNORMAL HIGH (ref 7–11)
NEUTROPHILS %: 30 % — ABNORMAL LOW (ref 41–77)
RDW: 17 % — ABNORMAL HIGH (ref 11–15)
WBC COUNT: 6.2 K/UL — ABNORMAL LOW (ref 4.5–11.0)

## 2022-08-02 LAB — COMPREHENSIVE METABOLIC PANEL
ALBUMIN: 3.3 g/dL — ABNORMAL LOW (ref 3.5–5.0)
CHLORIDE: 97 MMOL/L — ABNORMAL LOW (ref 98–110)
SODIUM: 138 MMOL/L (ref 137–147)

## 2022-08-02 LAB — CA19.9: CA 19-9: <1 U/mL — ABNORMAL LOW (ref <35)

## 2022-08-02 LAB — CEA(CARCINOEMBRYONIC AG): CEA: 95 ng/mL — ABNORMAL HIGH (ref <3.0)

## 2022-08-02 MED ORDER — SODIUM CHLORIDE 0.9 % IV SOLP
1000 mL | Freq: Once | INTRAVENOUS | 0 refills | Status: CP
Start: 2022-08-02 — End: ?
  Administered 2022-08-02: 15:00:00 1000 mL via INTRAVENOUS

## 2022-08-02 MED ORDER — SODIUM CHLORIDE 0.9 % IV SOLP
1000 mL | Freq: Once | INTRAVENOUS | 0 refills | Status: CN
Start: 2022-08-02 — End: ?

## 2022-08-02 MED ORDER — MAGNESIUM SULFATE IN WATER 2 GRAM/50 ML (4 %) IV PGBK
2 g | Freq: Once | INTRAVENOUS | 0 refills | Status: CP
Start: 2022-08-02 — End: ?
  Administered 2022-08-02: 15:00:00 2 g via INTRAVENOUS

## 2022-08-02 MED ORDER — POTASSIUM CHLORIDE 20 MEQ PO TBTQ
40 meq | Freq: Once | ORAL | 0 refills | Status: CN
Start: 2022-08-02 — End: ?

## 2022-08-02 MED ORDER — POTASSIUM CHLORIDE IN WATER 10 MEQ/50 ML IV PGBK
10 meq | Freq: Once | INTRAVENOUS | 0 refills | Status: CN
Start: 2022-08-02 — End: ?

## 2022-08-02 MED ORDER — POTASSIUM CHLORIDE IN WATER 10 MEQ/50 ML IV PGBK
10 meq | Freq: Once | INTRAVENOUS | 0 refills | Status: CP
Start: 2022-08-02 — End: ?
  Administered 2022-08-02: 15:00:00 10 meq via INTRAVENOUS

## 2022-08-02 MED ORDER — MAGNESIUM SULFATE IN WATER 2 GRAM/50 ML (4 %) IV PGBK
2 g | Freq: Once | INTRAVENOUS | 0 refills | Status: CN
Start: 2022-08-02 — End: ?

## 2022-08-02 MED ORDER — POTASSIUM CHLORIDE 20 MEQ PO TBTQ
40 meq | Freq: Once | ORAL | 0 refills | Status: CP
Start: 2022-08-02 — End: ?
  Administered 2022-08-02: 15:00:00 40 meq via ORAL

## 2022-08-02 NOTE — Progress Notes
Patient arrived to clinic from doctor's for scheduled for Magnesium, Potassium and IVF. Please refer to doctor's notes for further assessment details. Port flushed with positive blood return noted. Labs and vitals WDL for treatment. Patient received treatment; tolerated well. Port flushed and de-accessed. Patient left clinic in stable condition.

## 2022-08-02 NOTE — Progress Notes
Name: Vicki Buchanan          MRN: [id number]      DOB: 09-19-1955      AGE: 67 y.o.   DATE OF SERVICE: 08/02/2022    Subjective:             Reason for Visit:  Cancer Follow up      Vicki Buchanan is a 66 y.o. female.       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      She was seen by her PCP on 02/14/2017 with intermittent but recurrent abdominal cramping, bloating and diarrhea. Patient reports 6-7 months ago she began having episodes of abdominal cramping that progressed in severity until it caused her to vomit multiple times until it was dry heaving. This was followed by diarrhea the next morning. Patient continued to have abdominal cramping and bloating with alternating watery diarrhea and constipation.  Upon examination she was noted decreased bowel sounds, mass (multiple firm lesions noted on deep palpation to RLQ, mid upper quadrant, and LLQ, non tender to palpation). She is tender with firm mass noted RUQ, LUQ and LLQ. A CT scan was done on 02/14/2017 that demonstrated large volume of abdominopelvic ascites, omental caking, evidence of pelvic peritoneal  enhancing nodularity suspicious for peritoneal carcinomatosis, and hepatic tumor implantation. Findings are most suspicious  for underling ovarian or gastrointestinal neoplasm.     She was then seen by a General Surgeon and on 02/19/2017 she underwent diagnostic laparoscopy with biopsies of right diaphragm, right liver, right fallopian tube and left pelvis. Pathology showed involvement by mucinous neoplasm with low-grade features in all four biopsy specimens. CA 125 on 02/27/2017 was 90.4 and CEA is 1621.0.    Findings at the time of surgery on 02/19/17 by Dr. Jonah Blue: A total of 1.7 L of fluid was removed and the peritoneal cavity.  It was slightly turbid and hemorrhagic in color.  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  There was bulky disease in the pelvis, scattered non-bulky disease of the right hemidiaphragm and on the capsule liver.  Left hemidiaphragm appear to be spared.  On the parietal peritoneum it was started with areas suspicious for carcinomatosis.  The stomach was observed.  There were no evident masses that could be discerned externally.  There was bulky disease in the pelvis over the bladder and a large, what appeared to be, right tubo-ovarian mass with heavy disposition of tumor in that area.  She had also an area of the right fimbria of the fallopian tube with a large to positive tumor.  A portion of the right fallopian tube and fimbria were removed with ligature device.  Biopsies left pelvis were taken of the left pelvic sidewall.  Biopsies were also taken of the serosal surface of the liver and the right hemidiaphragm.     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 This was low grade mucinous neoplasm which was focally positive for CK7, and negative for PAX-8. Cytology from abdominal fluid was negative for malignancy.     She was taken to the OR on 04/03/2017 for potential cytoreductive surgery and hyperthermic intraperitoneal chemotherapy (CRS and HIPEC). 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  The process involved the entire mesentery of the small and large bowel as well as the root of the mesentery.  Almost all of the peritoneal surfaces had disease.  Unfortunately, the mesenteric disease was infiltrative to an extent where it would have required too much of bowel resection in order to remove the disease. A diverting loop ileostomy was performed to help alleviate obstructive symptoms. Pathology described mucinous carcinoma with low-grade morphology and occasional high-grade areas (G2 and focal G3 with focal signet ring cells).    She received FOLFOX + Avastin since February-September 2019 under the direction of Dr. Josiah Lobo. She began a clinical trial in Sept 2019 with SIROLIMUS + EPACADOSTAT.    She stopped the study drug on 05/21/18 as she chose to enroll in the Mitomycin HIPEC trial at [facility] and completed her first laparoscopic HIPEC on 05/28/18 and second laparoscopic HIPEC on 06/25/18.     She continued to have significant pelvic discomfort and urinary frequency due to the large pelvic mass. She sought an opinion from Dr. [name] in [place], [place]. On 09/12/2018, she underwent an exp Lap, cytoreductive debulking surgery for metastatic mucinous adenocarcinoma of the appendix, total colectomy, splenectomy, omentectomy, TAH/BSO and HIPEC with Mitomycin-C. She underwent a CC-3 cytoreduction with significant amount of visible and palpable disease remaining.    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    Had debulking of the peristomal tumor in July 2022 by Dr. [name].    Started back on maintenance 5-FU, irinotecan and bevacizumab on 12/14/2020.        Vicki Buchanan presents today for surgical surveillance. She has been receiving chemo every 2 weeks and has been fatigued. Plan will be to switch to monthly chemo for at least the next few months.     Recent labs and imaging reviewed.       Review of Systems   Constitutional:  Positive for fatigue. Negative for activity change, appetite change, chills, diaphoresis, fever and unexpected weight change.   HENT:  Negative for facial swelling, mouth sores, sore throat, trouble swallowing and voice change.    Eyes: Negative.  Negative for visual disturbance.   Respiratory:  Negative for cough, chest tightness, shortness of breath and wheezing.    Cardiovascular: Negative.  Negative for chest pain, palpitations and leg swelling.   Gastrointestinal:  Positive for diarrhea. Negative for abdominal distention, abdominal pain, anal bleeding, blood in stool, constipation, nausea, rectal pain and vomiting.   Genitourinary:  Negative for difficulty urinating, dysuria and hematuria.   Musculoskeletal: Negative.  Negative for arthralgias, back pain, neck pain and neck stiffness.   Skin:  Negative for color change, pallor, rash and wound.   Neurological:  Positive for weakness. Negative for dizziness, light-headedness, numbness and headaches.   Hematological:  Does not bruise/bleed easily.   Psychiatric/Behavioral:  Negative for agitation. The patient is not nervous/anxious.          Objective:          apap-maalox-lidocaine 1:3:1 oral suspension (MIXTURE COMPOUND) Take 30 mL by mouth every 4 hours as needed.    calcium carbonate (TUMS) 500 mg (200 mg elemental calcium) chewable tablet Chew one tablet by mouth every 6 hours as needed.    carvediloL (COREG) 6.25 mg tablet Take one tablet by mouth twice daily. Take with food.  Hold if systolic blood pressure < 110    Cetirizine (ZYRTEC) 10 mg cap Take one capsule by mouth as Needed.    ciprofloxacin (CIPRO) 250 mg tablet Take one tablet by mouth every 72 hours.  dexAMETHasone (DECADRON) 4 mg tablet 1 tab by mouth with breakfast and afternoon snack on days 2 & 3 following each chemotherapy cycle. Do not take after 6 pm to avoid insomnia.    diphenoxylate-atropine (LOMOTIL) 2.5-0.025 mg tablet Take one tablet by mouth four times daily as needed for Diarrhea.    esomeprazole DR (NEXIUM) 20 mg capsule Take one capsule by mouth daily. Take on an empty stomach at least 1 hour before or 2 hours after food.    loperamide (IMODIUM A-D) 2 mg capsule Take one pill by mouth four times daily as needed    magnesium oxide 400 mg magnesium tablet Take one tablet by mouth twice daily. (Patient taking differently: Take one tablet by mouth twice daily as needed.)    ondansetron (ZOFRAN ODT) 8 mg rapid dissolve tablet Dissolve one tablet by mouth every 8 hours as needed for Nausea or Vomiting. Place on tongue to disolve.    potassium chloride (KLOR-CON SPRINKLE) 10 mEq capsule Take one capsule by mouth twice daily. If potassium < 3.5. Take with a meal and a full glass of water.    psyllium husk (METAMUCIL) 0.4 gram cap Take one capsule by mouth daily as needed.    sod chlor,sod bicarb/neti pot (SINUS WASH NETI POT SINI) Use as directed    sodium chloride 0.9% (NS) 0.9 % 1,000 mL with potassium chloride 2 mEq/mL 20 mEq, magnesium sulfate 4 mEq/mL (50 %) 2 g IV infusion Administer 1L NS with K and 2g Mg once weekly via outpatient infusion.    traMADoL (ULTRAM) 50 mg tablet Take one tablet to two tablets by mouth daily as needed for Pain.     Vitals:    08/02/22 0851   PainSc: Zero     There is no height or weight on file to calculate BMI.     Pain Score: Zero       Fatigue Scale: 7    Pain Addressed:  N/A    Patient Evaluated for a Clinical Trial: No treatment clinical trial available for this patient.     [place] Cooperative Oncology Group performance status is 1, Restricted in physically strenuous activity but ambulatory and able to carry out work of a light or sedentary nature, e.g., light house work, office work.     Physical Exam  Vitals reviewed.   Constitutional:       Appearance: She is well-developed.   HENT:      Head: Normocephalic and atraumatic.   Musculoskeletal:         General: Normal range of motion.   Skin:     General: Skin is warm and dry.   Neurological:      Mental Status: She is alert and oriented to person, place, and time.   Psychiatric:         Behavior: Behavior normal.         Thought Content: Thought content normal.         Judgment: Judgment normal.               Assessment and Plan:  Peritoneal carcinomatosis  - Interval history, imaging and labs reviewed.  - Medical oncology with Dr. Josiah Lobo  - RTC 6 months for sooner if concerns arise.    Lilian Kapur, PA-C    Collaborating physician: Loralie Champagne, MD

## 2022-08-07 ENCOUNTER — Encounter: Admit: 2022-08-07 | Discharge: 2022-08-07 | Payer: MEDICARE

## 2022-08-08 ENCOUNTER — Encounter: Admit: 2022-08-08 | Discharge: 2022-08-08 | Payer: MEDICARE

## 2022-08-08 DIAGNOSIS — C786 Secondary malignant neoplasm of retroperitoneum and peritoneum: Secondary | ICD-10-CM

## 2022-08-16 ENCOUNTER — Encounter: Admit: 2022-08-16 | Discharge: 2022-08-16 | Payer: MEDICARE

## 2022-08-16 ENCOUNTER — Ambulatory Visit: Admit: 2022-08-16 | Discharge: 2022-08-16 | Payer: MEDICARE

## 2022-08-16 DIAGNOSIS — C786 Secondary malignant neoplasm of retroperitoneum and peritoneum: Secondary | ICD-10-CM

## 2022-08-17 ENCOUNTER — Encounter: Admit: 2022-08-17 | Discharge: 2022-08-17 | Payer: MEDICARE

## 2022-08-18 ENCOUNTER — Encounter: Admit: 2022-08-18 | Discharge: 2022-08-18 | Payer: MEDICARE

## 2022-08-18 MED ORDER — ATROPINE 0.4 MG/ML IJ SOLN
0.4 mg | INTRAVENOUS | 0 refills | PRN
Start: 2022-08-18 — End: ?

## 2022-08-18 MED ORDER — FLUOROURACIL IV AMB PUMP
2200 mg/m2 | Freq: Once | INTRAVENOUS | 0 refills
Start: 2022-08-18 — End: ?

## 2022-08-18 MED ORDER — DEXAMETHASONE 4 MG PO TAB
4 mg | Freq: Once | ORAL | 0 refills
Start: 2022-08-18 — End: ?

## 2022-08-18 MED ORDER — PALONOSETRON 0.25 MG/5 ML IV SOLN
.25 mg | Freq: Once | INTRAVENOUS | 0 refills
Start: 2022-08-18 — End: ?

## 2022-08-24 ENCOUNTER — Encounter: Admit: 2022-08-24 | Discharge: 2022-08-24 | Payer: MEDICARE

## 2022-08-24 DIAGNOSIS — K219 Gastro-esophageal reflux disease without esophagitis: Secondary | ICD-10-CM

## 2022-08-24 DIAGNOSIS — C786 Secondary malignant neoplasm of retroperitoneum and peritoneum: Secondary | ICD-10-CM

## 2022-08-24 DIAGNOSIS — I454 Nonspecific intraventricular block: Secondary | ICD-10-CM

## 2022-08-24 DIAGNOSIS — C189 Malignant neoplasm of colon, unspecified: Secondary | ICD-10-CM

## 2022-08-24 DIAGNOSIS — C181 Malignant neoplasm of appendix: Secondary | ICD-10-CM

## 2022-08-24 DIAGNOSIS — R112 Nausea with vomiting, unspecified: Secondary | ICD-10-CM

## 2022-08-24 DIAGNOSIS — Z87440 Personal history of urinary (tract) infections: Secondary | ICD-10-CM

## 2022-08-24 DIAGNOSIS — C801 Malignant (primary) neoplasm, unspecified: Secondary | ICD-10-CM

## 2022-08-24 DIAGNOSIS — R509 Fever, unspecified: Secondary | ICD-10-CM

## 2022-08-24 LAB — COMPREHENSIVE METABOLIC PANEL
POTASSIUM: 4.4 MMOL/L (ref 3.5–5.1)
SODIUM: 138 MMOL/L (ref 137–147)

## 2022-08-24 LAB — CBC AND DIFF
ABSOLUTE BASO COUNT: 0.1 K/UL (ref 0–0.20)
ABSOLUTE EOS COUNT: 0.9 K/UL — ABNORMAL HIGH (ref 0–0.45)
ABSOLUTE LYMPH COUNT: 3 K/UL (ref 1.0–4.8)
ABSOLUTE MONO COUNT: 2.2 K/UL — ABNORMAL HIGH (ref 0–0.80)
ABSOLUTE NEUTROPHIL: 6.8 K/UL (ref 1.8–7.0)
BASOPHILS %: 1 % (ref 0–2)
EOSINOPHILS %: 7 % — ABNORMAL HIGH (ref >60)
HEMATOCRIT: 23 % — ABNORMAL LOW (ref 36–45)
LYMPHOCYTES %: 23 % — ABNORMAL LOW (ref 24–44)
MCH: 34 pg — ABNORMAL HIGH (ref 26–34)
MONOCYTES %: 17 % — ABNORMAL HIGH (ref 4–12)
MPV: 7.7 FL (ref 7–11)
NEUTROPHILS %: 52 % (ref 41–77)
PLATELET COUNT: 424 K/UL — ABNORMAL HIGH (ref 150–400)
RBC COUNT: 2.1 M/UL — ABNORMAL LOW (ref 4.0–5.0)
RDW: 16 % — ABNORMAL HIGH (ref 11–15)
WBC COUNT: 13 K/UL — ABNORMAL HIGH (ref 4.5–11.0)

## 2022-08-24 LAB — CA19.9: CA 19-9: <1 U/mL — ABNORMAL LOW (ref <35)

## 2022-08-24 LAB — MAGNESIUM: MAGNESIUM: 1.4 mg/dL — ABNORMAL LOW (ref 1.6–2.6)

## 2022-08-24 LAB — CEA(CARCINOEMBRYONIC AG): CEA: 700 ng/mL — ABNORMAL HIGH (ref <3.0)

## 2022-08-24 MED ORDER — IRINOTECAN IVPB
110 mg/m2 | Freq: Once | INTRAVENOUS | 0 refills | Status: CP
Start: 2022-08-24 — End: ?
  Administered 2022-08-24 (×3): 166.2 mg via INTRAVENOUS

## 2022-08-24 MED ORDER — FLUCONAZOLE 100 MG PO TAB
100 mg | Freq: Every day | ORAL | 0 refills | Status: CN
Start: 2022-08-24 — End: ?

## 2022-08-24 MED ORDER — SODIUM CHLORIDE 0.9 % IV SOLP
1000 mL | Freq: Once | INTRAVENOUS | 0 refills | Status: CP
Start: 2022-08-24 — End: ?
  Administered 2022-08-24: 15:00:00 1000 mL via INTRAVENOUS

## 2022-08-24 MED ORDER — BEVACIZUMAB-AWWB IVPB
5 mg/kg | Freq: Once | INTRAVENOUS | 0 refills | Status: CN
Start: 2022-08-24 — End: ?

## 2022-08-24 MED ORDER — ATROPINE 0.4 MG/ML IJ SOLN
.4 mg | INTRAVENOUS | 0 refills | Status: AC | PRN
Start: 2022-08-24 — End: ?
  Administered 2022-08-24: 18:00:00 0.4 mg via INTRAVENOUS

## 2022-08-24 MED ORDER — APREPITANT 7.2 MG/ML IV EMUL
130 mg | Freq: Once | INTRAVENOUS | 0 refills | Status: CP
Start: 2022-08-24 — End: ?
  Administered 2022-08-24: 18:00:00 130 mg via INTRAVENOUS

## 2022-08-24 MED ORDER — MAGNESIUM SULFATE IN D5W 1 GRAM/100 ML IV PGBK (CC ONLY)
1 g | INTRAVENOUS | 0 refills | Status: CP
Start: 2022-08-24 — End: ?
  Administered 2022-08-24 (×2): 1 g via INTRAVENOUS

## 2022-08-24 MED ORDER — PALONOSETRON 0.25 MG/5 ML IV SOLN
.25 mg | Freq: Once | INTRAVENOUS | 0 refills | Status: CP
Start: 2022-08-24 — End: ?
  Administered 2022-08-24: 16:00:00 0.25 mg via INTRAVENOUS

## 2022-08-24 MED ORDER — AMOXICILLIN-POT CLAVULANATE 500-125 MG PO TAB
1 | ORAL_TABLET | Freq: Two times a day (BID) | ORAL | 0 refills | 7.00000 days | Status: AC
Start: 2022-08-24 — End: ?

## 2022-08-24 MED ORDER — AMPICILLIN 500 MG PO CAP
500 mg | ORAL | 0 refills | Status: CN
Start: 2022-08-24 — End: ?

## 2022-08-24 MED ORDER — FLUCONAZOLE 100 MG PO TAB
100 mg | ORAL_TABLET | Freq: Every day | ORAL | 0 refills | 3.00000 days | Status: AC
Start: 2022-08-24 — End: ?

## 2022-08-24 MED ORDER — FLUOROURACIL IV AMB PUMP
2200 mg/m2 | Freq: Once | INTRAVENOUS | 0 refills | Status: CP
Start: 2022-08-24 — End: ?
  Administered 2022-08-24 (×3): 3322 mg via INTRAVENOUS

## 2022-08-24 MED ORDER — BEVACIZUMAB-AWWB IVPB
5 mg/kg | Freq: Once | INTRAVENOUS | 0 refills | Status: CP
Start: 2022-08-24 — End: ?
  Administered 2022-08-24 (×2): 260.5 mg via INTRAVENOUS

## 2022-08-24 NOTE — Progress Notes
Patient arrived to CC treatment for C32D1 Folfiri and Mvasi, IV Magnesium, and IVF. Patient seen in clinic by Lianne Moris, APRN-NP prior to appointment; see clinic note for assessment details.     Cancer Therapeutics Note  Verified consent signed and in chart.    Blood return positive via: Port (Single)    BSA and dose double checked (agree with orders as written) with: yes see eMAR    Labs/applicable tests checked: CBC, Comprehensive Metabolic Panel (CMP), and BP    Treatment regimen: C32D1  bevacizumab-awwb (MVASI) 260.5 mg in sodium chloride 0.9% (NS) 110.42 mL IVPB    fluorouraciL (ADRUCIL) 3,322 mg in sodium chloride 0.9% (NS) 92 mL IV amb pump    irinotecan (CAMPTOSAR) 166.2 mg in dextrose 5% (D5W) 508.31 mL IVPB    Rate verified and armband double check with second RN: yes    Patient education offered and stated understanding. Denies questions at this time.    All treatment parameters met. Premeds given per treatment plan. IVF, IV Magneisum, Irinotecan, and Mvasi infused without incident, patient tolerated well. PRN Atropine given x1. Port positive for blood return, flushed with saline, and currently infusing 5FU via ambulatory pump. All questions and concerns addressed. Patient left treatment in stable condition.

## 2022-09-10 ENCOUNTER — Encounter: Admit: 2022-09-10 | Discharge: 2022-09-10 | Payer: MEDICARE

## 2022-09-13 ENCOUNTER — Encounter: Admit: 2022-09-13 | Discharge: 2022-09-13 | Payer: MEDICARE

## 2022-09-13 DIAGNOSIS — C786 Secondary malignant neoplasm of retroperitoneum and peritoneum: Secondary | ICD-10-CM

## 2022-09-13 DIAGNOSIS — K219 Gastro-esophageal reflux disease without esophagitis: Secondary | ICD-10-CM

## 2022-09-13 DIAGNOSIS — C801 Malignant (primary) neoplasm, unspecified: Secondary | ICD-10-CM

## 2022-09-13 DIAGNOSIS — I454 Nonspecific intraventricular block: Secondary | ICD-10-CM

## 2022-09-13 DIAGNOSIS — C189 Malignant neoplasm of colon, unspecified: Secondary | ICD-10-CM

## 2022-09-13 DIAGNOSIS — R112 Nausea with vomiting, unspecified: Secondary | ICD-10-CM

## 2022-09-13 DIAGNOSIS — C181 Malignant neoplasm of appendix: Secondary | ICD-10-CM

## 2022-09-13 LAB — CBC AND DIFF
ABSOLUTE BASO COUNT: 0 K/UL (ref 0–0.20)
ABSOLUTE EOS COUNT: 0.6 K/UL — ABNORMAL HIGH (ref 0–0.45)
ABSOLUTE LYMPH COUNT: 2.8 K/UL (ref 1.0–4.8)
ABSOLUTE MONO COUNT: 1.8 K/UL — ABNORMAL HIGH (ref 0–0.80)
ABSOLUTE NEUTROPHIL: 2.5 K/UL (ref 1.8–7.0)
BASOPHILS %: 0 % (ref 0–2)
EOSINOPHILS %: 8 % — ABNORMAL HIGH (ref 0–5)
HEMATOCRIT: 24 % — ABNORMAL LOW (ref 36–45)
HEMOGLOBIN: 8 g/dL — ABNORMAL LOW (ref 12.0–15.0)
LYMPHOCYTES %: 36 % (ref 24–44)
MCH: 34 pg — ABNORMAL HIGH (ref 26–34)
MCHC: 32 g/dL (ref 32.0–36.0)
MCV: 105 FL — ABNORMAL HIGH (ref 80–100)
MONOCYTES %: 24 % — ABNORMAL HIGH (ref 4–12)
MPV: 7.5 FL (ref 7–11)
NEUTROPHILS %: 32 % — ABNORMAL LOW (ref 41–77)
PLATELET COUNT: 463 K/UL — ABNORMAL HIGH (ref 150–400)
RBC COUNT: 2.3 M/UL — ABNORMAL LOW (ref 4.0–5.0)
RDW: 17 % — ABNORMAL HIGH (ref >60)
WBC COUNT: 7.8 K/UL (ref 4.5–11.0)

## 2022-09-13 LAB — CEA(CARCINOEMBRYONIC AG): CEA: 658 ng/mL — ABNORMAL HIGH (ref <3.0)

## 2022-09-13 LAB — COMPREHENSIVE METABOLIC PANEL
CALCIUM: 8.4 mg/dL — ABNORMAL LOW (ref <35)
CREATININE: 1.9 mg/dL — ABNORMAL HIGH (ref 0.4–1.00)
GLUCOSE,PANEL: 96 mg/dL (ref 70–100)
POTASSIUM: 4.1 MMOL/L (ref 3.5–5.1)
SODIUM: 138 MMOL/L (ref 137–147)
TOTAL PROTEIN: 7.2 g/dL (ref 6.0–8.0)

## 2022-09-13 LAB — MAGNESIUM: MAGNESIUM: 1.1 mg/dL — ABNORMAL LOW (ref 1.6–2.6)

## 2022-09-13 MED ORDER — FLUOROURACIL IV AMB PUMP
2200 mg/m2 | Freq: Once | INTRAVENOUS | 0 refills | Status: CP
Start: 2022-09-13 — End: ?
  Administered 2022-09-13 (×3): 3322 mg via INTRAVENOUS

## 2022-09-13 MED ORDER — IRINOTECAN IVPB
110 mg/m2 | Freq: Once | INTRAVENOUS | 0 refills | Status: CP
Start: 2022-09-13 — End: ?
  Administered 2022-09-13 (×3): 166.2 mg via INTRAVENOUS

## 2022-09-13 MED ORDER — MAGNESIUM SULFATE IN D5W 1 GRAM/100 ML IV PGBK (CC ONLY)
1 g | INTRAVENOUS | 0 refills | Status: CP
Start: 2022-09-13 — End: ?
  Administered 2022-09-13 (×2): 1 g via INTRAVENOUS

## 2022-09-13 MED ORDER — APREPITANT 7.2 MG/ML IV EMUL
130 mg | Freq: Once | INTRAVENOUS | 0 refills | Status: CP
Start: 2022-09-13 — End: ?
  Administered 2022-09-13: 15:00:00 130 mg via INTRAVENOUS

## 2022-09-13 MED ORDER — BEVACIZUMAB-AWWB IVPB
5 mg/kg | Freq: Once | INTRAVENOUS | 0 refills | Status: CP
Start: 2022-09-13 — End: ?
  Administered 2022-09-13 (×2): 260.5 mg via INTRAVENOUS

## 2022-09-13 MED ORDER — SODIUM CHLORIDE 0.9 % IV SOLP
1000 mL | Freq: Once | INTRAVENOUS | 0 refills | Status: CP
Start: 2022-09-13 — End: ?
  Administered 2022-09-13: 15:00:00 1000 mL via INTRAVENOUS

## 2022-09-13 MED ORDER — PALONOSETRON 0.25 MG/5 ML IV SOLN
.25 mg | Freq: Once | INTRAVENOUS | 0 refills | Status: CP
Start: 2022-09-13 — End: ?
  Administered 2022-09-13: 15:00:00 0.25 mg via INTRAVENOUS

## 2022-09-13 MED ORDER — ATROPINE 0.4 MG/ML IJ SOLN
.4 mg | INTRAVENOUS | 0 refills | Status: AC | PRN
Start: 2022-09-13 — End: ?
  Administered 2022-09-13: 16:00:00 0.4 mg via INTRAVENOUS

## 2022-09-13 NOTE — Progress Notes
Patient arrived to CC treatment for C33D1 IV Magnesium, Mvasi, Irinotecan, and 5FU. Patient seen in clinic by Dr. Josiah Lobo prior to appointment; see clinic note for assessment details.     Cancer Therapeutics Note  Verified consent signed and in chart.    Blood return positive via: Port (Single)    BSA and dose double checked (agree with orders as written) with: yes see eMAR    Labs/applicable tests checked: CBC, Comprehensive Metabolic Panel (CMP), and BP    Treatment regimen: C33D1  fluorouraciL (ADRUCIL) 3,322 mg in sodium chloride 0.9% (NS) 92 mL IV amb pump    irinotecan (CAMPTOSAR) 166.2 mg in dextrose 5% (D5W) 508.31 mL IVPB    bevacizumab-awwb (MVASI) 260.5 mg in sodium chloride 0.9% (NS) 110.42 mL IVPB    Rate verified and armband double check with second RN: yes    Patient education offered and stated understanding. Denies questions at this time.    Okay to treat with BP 151/81 per Dr. Josiah Lobo. All other treatment parameters met. Premeds given per treatment plan. IV Magnesium, Mvasi, and Irinotecan infused without incident, patient tolerated well. PRN Atropine given x1. Port positive for blood return, flushed with saline, and currently infusing 5FU via ambulatory pump. All questions and concerns addressed. Patient left treatment in stable condition.

## 2022-09-14 ENCOUNTER — Encounter: Admit: 2022-09-14 | Discharge: 2022-09-14 | Payer: MEDICARE

## 2022-09-22 ENCOUNTER — Encounter: Admit: 2022-09-22 | Discharge: 2022-09-22 | Payer: MEDICARE

## 2022-09-22 DIAGNOSIS — C181 Malignant neoplasm of appendix: Secondary | ICD-10-CM

## 2022-09-22 LAB — COMPREHENSIVE METABOLIC PANEL

## 2022-09-22 LAB — CBC AND DIFF

## 2022-10-11 ENCOUNTER — Encounter: Admit: 2022-10-11 | Discharge: 2022-10-11 | Payer: MEDICARE

## 2022-10-11 DIAGNOSIS — R112 Nausea with vomiting, unspecified: Secondary | ICD-10-CM

## 2022-10-11 DIAGNOSIS — C786 Secondary malignant neoplasm of retroperitoneum and peritoneum: Secondary | ICD-10-CM

## 2022-10-11 DIAGNOSIS — K219 Gastro-esophageal reflux disease without esophagitis: Secondary | ICD-10-CM

## 2022-10-11 DIAGNOSIS — I454 Nonspecific intraventricular block: Secondary | ICD-10-CM

## 2022-10-11 DIAGNOSIS — C801 Malignant (primary) neoplasm, unspecified: Secondary | ICD-10-CM

## 2022-10-11 DIAGNOSIS — C189 Malignant neoplasm of colon, unspecified: Secondary | ICD-10-CM

## 2022-10-11 DIAGNOSIS — C181 Malignant neoplasm of appendix: Secondary | ICD-10-CM

## 2022-10-11 LAB — CBC AND DIFF
ABSOLUTE BASO COUNT: 0.1 10*3/uL (ref 0–0.20)
ABSOLUTE EOS COUNT: 0.5 10*3/uL — ABNORMAL HIGH (ref 0–0.45)
ABSOLUTE LYMPH COUNT: 2.6 10*3/uL (ref 1.0–4.8)
ABSOLUTE MONO COUNT: 1.6 10*3/uL — ABNORMAL HIGH (ref 0–0.80)
ABSOLUTE NEUTROPHIL: 5 10*3/uL (ref 1.8–7.0)
BASOPHILS %: 1 % (ref 0–2)
EOSINOPHILS %: 5 % — ABNORMAL LOW (ref >60)
HEMATOCRIT: 26 % — ABNORMAL LOW (ref 36–45)
MCH: 34 pg — ABNORMAL HIGH (ref 26–34)
MONOCYTES %: 16 % — ABNORMAL HIGH (ref 4–12)
MPV: 7.4 FL (ref 7–11)
NEUTROPHILS %: 51 % — ABNORMAL LOW (ref 41–77)
PLATELET COUNT: 472 10*3/uL — ABNORMAL HIGH (ref 150–400)
RBC COUNT: 2.4 M/UL — ABNORMAL LOW (ref 4.0–5.0)
RDW: 16 % — ABNORMAL HIGH (ref 11–15)
WBC COUNT: 9.8 10*3/uL (ref 4.5–11.0)

## 2022-10-11 LAB — MAGNESIUM: MAGNESIUM: 1.1 mg/dL — ABNORMAL LOW (ref 1.6–2.6)

## 2022-10-11 LAB — CEA(CARCINOEMBRYONIC AG): CEA: 462 ng/mL — ABNORMAL HIGH (ref <3.0)

## 2022-10-11 LAB — CA19.9: CA 19-9: <1 U/mL — ABNORMAL LOW (ref <35)

## 2022-10-11 LAB — COMPREHENSIVE METABOLIC PANEL
POTASSIUM: 4.2 MMOL/L (ref 3.5–5.1)
SODIUM: 139 MMOL/L (ref 137–147)

## 2022-10-11 MED ORDER — FLUOROURACIL IV AMB PUMP
2200 mg/m2 | Freq: Once | INTRAVENOUS | 0 refills | Status: CN
Start: 2022-10-11 — End: ?

## 2022-10-11 MED ORDER — ATROPINE 0.4 MG/ML IJ SOLN
.4 mg | INTRAVENOUS | 0 refills | Status: AC | PRN
Start: 2022-10-11 — End: ?
  Administered 2022-10-11: 16:00:00 0.4 mg via INTRAVENOUS

## 2022-10-11 MED ORDER — PALONOSETRON 0.25 MG/5 ML IV SOLN
.25 mg | Freq: Once | INTRAVENOUS | 0 refills | Status: CP
Start: 2022-10-11 — End: ?
  Administered 2022-10-11: 14:00:00 0.25 mg via INTRAVENOUS

## 2022-10-11 MED ORDER — FLUOROURACIL IV AMB PUMP
2200 mg/m2 | Freq: Once | INTRAVENOUS | 0 refills | Status: CP
Start: 2022-10-11 — End: ?
  Administered 2022-10-11 (×3): 3322 mg via INTRAVENOUS

## 2022-10-11 MED ORDER — MAGNESIUM SULFATE IN D5W 1 GRAM/100 ML IV PGBK (CC ONLY)
1 g | INTRAVENOUS | 0 refills | Status: CP
Start: 2022-10-11 — End: ?
  Administered 2022-10-11 (×2): 1 g via INTRAVENOUS

## 2022-10-11 MED ORDER — FLUOROURACIL IV AMB PUMP
2200 mg/m2 | Freq: Once | INTRAVENOUS | 0 refills
Start: 2022-10-11 — End: ?

## 2022-10-11 MED ORDER — APREPITANT 130 MG/18 ML (7.2 MG/ML) IV EMUL
130 mg | Freq: Once | INTRAVENOUS | 0 refills | Status: CP
Start: 2022-10-11 — End: ?
  Administered 2022-10-11: 14:00:00 130 mg via INTRAVENOUS

## 2022-10-11 MED ORDER — ATROPINE 0.4 MG/ML IJ SOLN
0.4 mg | INTRAVENOUS | 0 refills | PRN
Start: 2022-10-11 — End: ?

## 2022-10-11 MED ORDER — IRINOTECAN IVPB
110 mg/m2 | Freq: Once | INTRAVENOUS | 0 refills | Status: CP
Start: 2022-10-11 — End: ?
  Administered 2022-10-11 (×2): 166.2 mg via INTRAVENOUS

## 2022-10-11 MED ORDER — PALONOSETRON 0.25 MG/5 ML IV SOLN
.25 mg | Freq: Once | INTRAVENOUS | 0 refills
Start: 2022-10-11 — End: ?

## 2022-10-11 MED ORDER — SODIUM CHLORIDE 0.9 % IV SOLP
1000 mL | Freq: Once | INTRAVENOUS | 0 refills | Status: CP
Start: 2022-10-11 — End: ?
  Administered 2022-10-11: 14:00:00 1000 mL via INTRAVENOUS

## 2022-10-11 NOTE — Patient Instructions
[facility]  Chemotherapy Instructions    [place] Fluellen 10/11/2022    Call Immediately to report the following:  Unexplained bleeding or bleeding that will not stop  Difficulty swallowing  Shortness of breath, wheezing, or trouble breathing  Rapid, irregular heartbeat; chest pain  Dizziness, lightheadedness  Rash or cut that swells or turns red, feels hot or painful, or begin to ooze  Diarrhea   Uncontrolled nausea or vomiting  Fever of 100.4 F or higher, or chills    Important Phone Numbers:  Cancer Center Main Number (answered 24 hours a day) [phone]  Cancer Center Scheduling (appointments) [phone]  Social Worker [phone]

## 2022-10-11 NOTE — Progress Notes
Patient arrived to CC treatment for Cycle 34 Day 1 Irinotecan, and 5FU after being seen in clinic by Della Goo, APRN; please refer to clinic note for assessment details.      Irinotecan given w/o complications, patient tolerated well. Port positive for blood return and flushed with NS. 5FU infusing via ambulatory pump, all connections checked, clamps open, and pump infusing. Patient declined copy of labs and AVS. All questions and concerns addressed. Patient left CC treatment in stable condition.    Cancer Therapeutics Note  Verified consent signed and in chart.    Blood return positive via: Port (Single)    BSA and dose double checked (agree with orders as written) with: yes, see MAR    Labs/applicable tests checked: CBC and Comprehensive Metabolic Panel (CMP)    Treatment regimen: C34D1 Irinotecan/5FU    irinotecan (CAMPTOSAR) 166.2 mg in dextrose 5% (D5W) 508.31 mL IVPB     fluorouraciL (ADRUCIL) 3,322 mg in sodium chloride 0.9% (NS) 92 mL IV amb pump     Rate verified and armband double check with second RN: yes    Patient education offered and stated understanding. Denies questions at this time.

## 2022-10-13 ENCOUNTER — Encounter: Admit: 2022-10-13 | Discharge: 2022-10-13 | Payer: MEDICARE

## 2022-10-13 DIAGNOSIS — C189 Malignant neoplasm of colon, unspecified: Secondary | ICD-10-CM

## 2022-10-13 DIAGNOSIS — K831 Obstruction of bile duct: Secondary | ICD-10-CM

## 2022-10-13 DIAGNOSIS — I454 Nonspecific intraventricular block: Secondary | ICD-10-CM

## 2022-10-13 DIAGNOSIS — C801 Malignant (primary) neoplasm, unspecified: Secondary | ICD-10-CM

## 2022-10-13 DIAGNOSIS — C181 Malignant neoplasm of appendix: Secondary | ICD-10-CM

## 2022-10-13 DIAGNOSIS — K219 Gastro-esophageal reflux disease without esophagitis: Secondary | ICD-10-CM

## 2022-10-26 ENCOUNTER — Encounter: Admit: 2022-10-26 | Discharge: 2022-10-26 | Payer: MEDICARE

## 2022-11-02 ENCOUNTER — Encounter: Admit: 2022-11-02 | Discharge: 2022-11-02 | Payer: MEDICARE

## 2022-11-02 ENCOUNTER — Ambulatory Visit: Admit: 2022-11-02 | Discharge: 2022-11-02 | Payer: MEDICARE

## 2022-11-02 ENCOUNTER — Ambulatory Visit: Admit: 2022-11-02 | Discharge: 2022-11-03 | Payer: MEDICARE

## 2022-11-02 DIAGNOSIS — C189 Malignant neoplasm of colon, unspecified: Secondary | ICD-10-CM

## 2022-11-02 DIAGNOSIS — K831 Obstruction of bile duct: Secondary | ICD-10-CM

## 2022-11-02 DIAGNOSIS — Z4659 Encounter for fitting and adjustment of other gastrointestinal appliance and device: Secondary | ICD-10-CM

## 2022-11-02 DIAGNOSIS — Z9889 Other specified postprocedural states: Secondary | ICD-10-CM

## 2022-11-02 DIAGNOSIS — I454 Nonspecific intraventricular block: Secondary | ICD-10-CM

## 2022-11-02 DIAGNOSIS — C786 Secondary malignant neoplasm of retroperitoneum and peritoneum: Secondary | ICD-10-CM

## 2022-11-02 DIAGNOSIS — C181 Malignant neoplasm of appendix: Secondary | ICD-10-CM

## 2022-11-02 DIAGNOSIS — Z79899 Other long term (current) drug therapy: Secondary | ICD-10-CM

## 2022-11-02 DIAGNOSIS — C801 Malignant (primary) neoplasm, unspecified: Secondary | ICD-10-CM

## 2022-11-02 DIAGNOSIS — K219 Gastro-esophageal reflux disease without esophagitis: Secondary | ICD-10-CM

## 2022-11-02 LAB — CBC AND DIFF
ABSOLUTE NEUTROPHIL COUNT MANUAL: 3.8 10*3/uL (ref 1.8–7.0)
BASOPHILS: 1 % (ref 0–2)
MCH: 34 pg — ABNORMAL HIGH (ref 26–34)
MCHC: 32 g/dL (ref 32.0–36.0)
MONOCYTES %: 11 % (ref 4–12)
NEUTROPHILS,SEG: 46 % (ref 41–77)
PLATELET COUNT: 517 10*3/uL — ABNORMAL HIGH (ref 150–400)
RBC COUNT: 2.7 M/UL — ABNORMAL LOW (ref 4.0–5.0)
RDW: 15 % — ABNORMAL HIGH (ref 11–15)
WBC COUNT: 8.3 10*3/uL (ref 4.5–11.0)

## 2022-11-02 LAB — COMPREHENSIVE METABOLIC PANEL
POTASSIUM: 4.3 MMOL/L (ref 3.5–5.1)
SODIUM: 139 MMOL/L (ref 137–147)

## 2022-11-02 LAB — PROTIME INR (PT)
INR: 1.2 % — ABNORMAL LOW (ref 0.9–1.2)
PROTIME: 13 s — ABNORMAL HIGH (ref 10.2–12.9)

## 2022-11-02 LAB — CA19.9: CA 19-9: <1 U/mL (ref <35)

## 2022-11-02 LAB — MAGNESIUM: MAGNESIUM: 1.5 mg/dL — ABNORMAL LOW (ref >60)

## 2022-11-02 LAB — CEA(CARCINOEMBRYONIC AG): CEA: 475 ng/mL — ABNORMAL HIGH (ref <3.0)

## 2022-11-02 MED ORDER — SUCCINYLCHOLINE CHLORIDE 20 MG/ML IJ SOLN
INTRAVENOUS | 0 refills | Status: DC
Start: 2022-11-02 — End: 2022-11-02

## 2022-11-02 MED ORDER — DEXAMETHASONE SODIUM PHOSPHATE 4 MG/ML IJ SOLN
INTRAVENOUS | 0 refills | Status: DC
Start: 2022-11-02 — End: 2022-11-02

## 2022-11-02 MED ORDER — ONDANSETRON HCL (PF) 4 MG/2 ML IJ SOLN
INTRAVENOUS | 0 refills | Status: DC
Start: 2022-11-02 — End: 2022-11-02

## 2022-11-02 MED ORDER — LIDOCAINE (PF) 200 MG/10 ML (2 %) IJ SYRG
INTRAVENOUS | 0 refills | Status: DC
Start: 2022-11-02 — End: 2022-11-02

## 2022-11-02 MED ORDER — FENTANYL CITRATE (PF) 50 MCG/ML IJ SOLN
INTRAVENOUS | 0 refills | Status: DC
Start: 2022-11-02 — End: 2022-11-02

## 2022-11-02 MED ORDER — ARTIFICIAL TEARS (PF) SINGLE DOSE DROPS GROUP
OPHTHALMIC | 0 refills | Status: DC
Start: 2022-11-02 — End: 2022-11-02

## 2022-11-02 MED ORDER — LACTATED RINGERS IV SOLP
INTRAVENOUS | 0 refills | Status: DC
Start: 2022-11-02 — End: 2022-11-02

## 2022-11-02 MED ORDER — PROPOFOL INJ 10 MG/ML IV VIAL
INTRAVENOUS | 0 refills | Status: DC
Start: 2022-11-02 — End: 2022-11-02

## 2022-11-02 NOTE — Anesthesia Post-Procedure Evaluation
Post-Anesthesia Evaluation    Name: Vicki Buchanan      MRN: [id number]     DOB: 01-09-1956     Age: 67 y.o.     Sex: female   __________________________________________________________________________     Procedure Information       Anesthesia Start Date/Time: 11/02/22 0946    Procedures:       ENDOSCOPIC RETROGRADE CHOLANGIOPANCREATOGRAPHY      ENDOSCOPIC RETROGRADE CHOLANGIOPANCREATOGRAPHY WITH REMOVAL AND EXCHANGE OF STENT BILIARY/ PANCREATIC DUCT - EACH STENT EXCHANGED    Location: ENDO 1 / ENDO/GI    Surgeons: Eliott Nine, MD            Post-Anesthesia Vitals  BP: 136/81 (08/15 1100)  Temp: 36.4 ?C (97.6 ?F) (08/15 1042)  Pulse: 68 (08/15 1103)  Respirations: 13 PER MINUTE (08/15 1103)  SpO2: 99 % (08/15 1103)  O2 Device: None (Room air) (08/15 1100)   Vitals Value Taken Time   BP 136/81 11/02/22 1100   Temp     Pulse 68 11/02/22 1103   Respirations 13 PER MINUTE 11/02/22 1103   SpO2 99 % 11/02/22 1103   O2 Device None (Room air) 11/02/22 1100   ABP     ART BP           Post Anesthesia Evaluation Note    Evaluation location: Pre/Post  Patient participation: recovered; patient participated in evaluation  Level of consciousness: alert    Pain score: 0  Pain management: adequate    Hydration: normovolemia  Temperature: 36.0?C - 38.4?C  Airway patency: adequate    Perioperative Events       Post-op nausea and vomiting: no PONV    Postoperative Status  Cardiovascular status: hemodynamically stable  Respiratory status: spontaneous ventilation  Follow-up needed: none        Perioperative Events  There were no known complications for this encounter.

## 2022-11-02 NOTE — Anesthesia Pre-Procedure Evaluation
Anesthesia Pre-Procedure Evaluation    Name: Vicki Buchanan      MRN: [id number]     DOB: [birth year]     Age: 67 y.o.     Sex: female   _________________________________________________________________________     Procedure Info:   Procedure Information       Date/Time: 11/02/22 0830    Procedures:       ENDOSCOPIC RETROGRADE CHOLANGIOPANCREATOGRAPHY      ENDOSCOPIC RETROGRADE CHOLANGIOPANCREATOGRAPHY WITH REMOVAL AND EXCHANGE OF STENT BILIARY/ PANCREATIC DUCT - EACH STENT EXCHANGED    Location: ENDO 2 / ENDO/GI    Surgeons: Eliott Nine, MD            Physical Assessment  Vital Signs (last filed in past 24 hours):  BP: 132/102 (08/15 0838)  Temp: 36.6 ?C (97.8 ?F) (08/15 5621)  Pulse: 82 (08/15 0838)  Respirations: 19 PER MINUTE (08/15 0838)  SpO2: 98 % (08/15 0838)  O2 Device: None (Room air) (08/15 0838)  Height: 152.4 cm (5') (08/15 0838)  Weight: 50.8 kg (112 lb) (08/15 3086)      Patient History   Allergies   Allergen Reactions    Indomethacin SEE COMMENTS     Pain for several days after suppository post ERCP. Tolerated IV but not PR    Shrimp SEE COMMENTS     Eye irritation. Namely blood vessel issues in the eyes         Current Medications    Medication Directions   apap-maalox-lidocaine 1:3:1 oral suspension (MIXTURE COMPOUND) Take 30 mL by mouth every 4 hours as needed.   calcium carbonate (TUMS) 500 mg (200 mg elemental calcium) chewable tablet Chew one tablet by mouth every 6 hours as needed.   carvediloL (COREG) 6.25 mg tablet Take one tablet by mouth twice daily. Take with food.  Hold if systolic blood pressure < 110   Cetirizine (ZYRTEC) 10 mg cap Take one capsule by mouth as Needed.   dexAMETHasone (DECADRON) 4 mg tablet 1 tab by mouth with breakfast and afternoon snack on days 2 & 3 following each chemotherapy cycle. Do not take after 6 pm to avoid insomnia.   diphenoxylate-atropine (LOMOTIL) 2.5-0.025 mg tablet Take one tablet by mouth four times daily as needed for Diarrhea.   esomeprazole DR (NEXIUM) 20 mg capsule Take one capsule by mouth daily. Take on an empty stomach at least 1 hour before or 2 hours after food.   loperamide (IMODIUM A-D) 2 mg capsule Take one pill by mouth four times daily as needed   magnesium oxide 400 mg magnesium tablet Take one tablet by mouth twice daily.  Patient taking differently: Take one tablet by mouth twice daily as needed.   ondansetron (ZOFRAN ODT) 8 mg rapid dissolve tablet Dissolve one tablet by mouth every 8 hours as needed for Nausea or Vomiting. Place on tongue to disolve.   potassium chloride (KLOR-CON SPRINKLE) 10 mEq capsule Take one capsule by mouth twice daily. If potassium < 3.5. Take with a meal and a full glass of water.   sod chlor,sod bicarb/neti pot (SINUS WASH NETI POT SINI) Use as directed   sodium chloride 0.9% (NS) 0.9 % 1,000 mL with potassium chloride 2 mEq/mL 20 mEq, magnesium sulfate 4 mEq/mL (50 %) 2 g IV infusion Administer 1L NS with K and 2g Mg once weekly via outpatient infusion.   traMADoL (ULTRAM) 50 mg tablet Take one tablet to two tablets by mouth daily as needed for Pain.  Review of Systems/Medical History      Patient summary reviewed  Pertinent labs reviewed    PONV Screening: Non-smoker and Female sex    No family history of anesthetic complications      Airway - negative        Previous grade 1 view with MAC 3      Pulmonary - negative          Lung metastases s/p radiation      Cardiovascular         Exercise tolerance: >4 METS       Beta Blocker therapy: Yes (states takes coreg only when DBP > 110)      Beta blockers within 24 hours: Yes (11/01/2022 @ 0900)      Hypertension, well controlled                  GI/Hepatic/Renal             GERD, well controlled        Liver disease (biliary obstruction):         Renal disease: CKD Stage 3 (eGFR 30-59)           S/p HIPEC, 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  Daily Nexium for GERD         Neuro/Psych         Neuropathy      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       Musculoskeletal - negative        No back pain        Endocrine/Other           Anemia        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    Abdominal Findings:         Abdomen soft    Neurological Findings:       Alert and oriented x 3    Constitutional findings:       No acute distress       Previous Airway Procedure Notes Displaying the 3 most recent records      Date Mask Difficulty Techninque used for succesful ETT placement Blade Type Blade Size View with successful placement Number of attempts    07/27/22 0 - not attempted Not documented Not documented 3 Not documented 1    03/30/22 1 - vent by mask direct laryngoscopy Macintosh 3 grade I - full view of glottis 1    12/22/21 0 - not attempted direct laryngoscopy Macintosh 3 grade I - full view of glottis 1            Patient Lines/Drains/Airways Status       Active Lines:       Name Placement  date Placement time Site Days    Implantable Port-a-cath Single Lumen 04/23/17 0845 Subclavian, Left 04/23/17  0845  Subclavian, Left  2019    Ileostomy 04/03/17 0951 Lower Right Quadrant 04/03/17  0951  Lower Right Quadrant  2039                  Diagnostic Tests  Hematology:   Lab Results   Component Value Date    HGB 9.3 11/02/2022    HCT 28.8 11/02/2022    PLTCT 517 11/02/2022    WBC 8.3 11/02/2022    NEUT 51 10/11/2022    ANC 5.00 10/11/2022    LYMPH 44 05/24/2022    ALC 2.60 10/11/2022    MONA 16 10/11/2022    AMC 1.60 10/11/2022    EOSA 5 10/11/2022    ABC 0.10 10/11/2022 MCV 105.6 11/02/2022    MCH 34.1 11/02/2022    MCHC 32.2 11/02/2022    MPV 7.5 11/02/2022    RDW 15.8 11/02/2022         General Chemistry:   Lab Results   Component Value Date    NA 139 11/02/2022    K 4.3 11/02/2022    CL 107 11/02/2022    CO2 21 11/02/2022    GAP 11 11/02/2022    BUN 22 11/02/2022    CR 2.09 11/02/2022    GLU 78 11/02/2022    CA 9.4 11/02/2022    ALBUMIN 3.9 11/02/2022    MG 1.5 11/02/2022    TOTBILI 0.4 11/02/2022    PO4 3.3 01/30/2022      Coagulation:   Lab Results   Component Value Date    PT 13.1 11/02/2022    PTT 28.7 07/10/2018    INR 1.2 11/02/2022       PAC Plan    Anesthesia Plan    ASA score: 3   Plan: general and RSI  Induction method: intravenous  NPO status: acceptable      Informed Consent  Anesthetic plan and risks discussed with patient.  Use of blood products discussed with patient  Blood Consent: consented      Plan discussed with: anesthesiologist, CRNA and surgeon/proceduralist.  Comments: (Pt seen and identified in holding, history and physical performed, all questions answered. Risks of GA with LMA/ETT including sore throat, oral/dental injury, allergic reactions, PONV, aspiration, respiratory failure, MI, CHF, Arrhythmias, CVA, possible post op ventilation and ICU course discussed with patient who reports understanding and consents to anesthetic plan.)      Alerts: No

## 2022-11-04 ENCOUNTER — Encounter: Admit: 2022-11-04 | Discharge: 2022-11-04 | Payer: MEDICARE

## 2022-11-04 DIAGNOSIS — K219 Gastro-esophageal reflux disease without esophagitis: Secondary | ICD-10-CM

## 2022-11-04 DIAGNOSIS — C181 Malignant neoplasm of appendix: Secondary | ICD-10-CM

## 2022-11-04 DIAGNOSIS — I454 Nonspecific intraventricular block: Secondary | ICD-10-CM

## 2022-11-04 DIAGNOSIS — C189 Malignant neoplasm of colon, unspecified: Secondary | ICD-10-CM

## 2022-11-04 DIAGNOSIS — C801 Malignant (primary) neoplasm, unspecified: Secondary | ICD-10-CM

## 2022-11-06 ENCOUNTER — Encounter: Admit: 2022-11-06 | Discharge: 2022-11-06 | Payer: MEDICARE

## 2022-11-07 ENCOUNTER — Encounter: Admit: 2022-11-07 | Discharge: 2022-11-07 | Payer: MEDICARE

## 2022-11-07 DIAGNOSIS — K219 Gastro-esophageal reflux disease without esophagitis: Secondary | ICD-10-CM

## 2022-11-07 DIAGNOSIS — C189 Malignant neoplasm of colon, unspecified: Secondary | ICD-10-CM

## 2022-11-07 DIAGNOSIS — C801 Malignant (primary) neoplasm, unspecified: Secondary | ICD-10-CM

## 2022-11-07 DIAGNOSIS — C181 Malignant neoplasm of appendix: Secondary | ICD-10-CM

## 2022-11-07 DIAGNOSIS — I454 Nonspecific intraventricular block: Secondary | ICD-10-CM

## 2022-11-08 ENCOUNTER — Encounter: Admit: 2022-11-08 | Discharge: 2022-11-08 | Payer: MEDICARE

## 2022-11-08 DIAGNOSIS — C786 Secondary malignant neoplasm of retroperitoneum and peritoneum: Secondary | ICD-10-CM

## 2022-11-08 DIAGNOSIS — Z9889 Other specified postprocedural states: Secondary | ICD-10-CM

## 2022-11-08 DIAGNOSIS — C181 Malignant neoplasm of appendix: Secondary | ICD-10-CM

## 2022-11-08 DIAGNOSIS — R112 Nausea with vomiting, unspecified: Secondary | ICD-10-CM

## 2022-11-08 LAB — COMPREHENSIVE METABOLIC PANEL
POTASSIUM: 3.8 MMOL/L (ref 3.5–5.1)
SODIUM: 138 MMOL/L (ref 137–147)

## 2022-11-08 LAB — MAGNESIUM: MAGNESIUM: 1.1 mg/dL — ABNORMAL LOW (ref 1.6–2.6)

## 2022-11-08 LAB — CBC AND DIFF
HEMOGLOBIN: 9.5 g/dL — ABNORMAL LOW (ref 12.0–15.0)
RBC COUNT: 2.8 M/UL — ABNORMAL LOW (ref 4.0–5.0)
WBC COUNT: 10 10*3/uL (ref 4.5–11.0)

## 2022-11-08 MED ORDER — APREPITANT 130 MG/18 ML (7.2 MG/ML) IV EMUL
130 mg | Freq: Once | INTRAVENOUS | 0 refills | Status: CP
Start: 2022-11-08 — End: ?
  Administered 2022-11-08: 14:00:00 130 mg via INTRAVENOUS

## 2022-11-08 MED ORDER — PALONOSETRON 0.25 MG/5 ML IV SOLN
.25 mg | Freq: Once | INTRAVENOUS | 0 refills | Status: CP
Start: 2022-11-08 — End: ?
  Administered 2022-11-08: 14:00:00 0.25 mg via INTRAVENOUS

## 2022-11-08 MED ORDER — FLUOROURACIL IV AMB PUMP
2200 mg/m2 | Freq: Once | INTRAVENOUS | 0 refills | Status: CP
Start: 2022-11-08 — End: ?
  Administered 2022-11-08 (×3): 3322 mg via INTRAVENOUS

## 2022-11-08 MED ORDER — SODIUM CHLORIDE 0.9 % IV SOLP
1000 mL | Freq: Once | INTRAVENOUS | 0 refills | Status: CP
Start: 2022-11-08 — End: ?
  Administered 2022-11-08: 14:00:00 1000 mL via INTRAVENOUS

## 2022-11-08 MED ORDER — ATROPINE 0.4 MG/ML IJ SOLN
0.4 mg | INTRAVENOUS | 0 refills | PRN
Start: 2022-11-08 — End: ?

## 2022-11-08 MED ORDER — BEVACIZUMAB-BVZR IVPB
5 mg/kg | Freq: Once | INTRAVENOUS | 0 refills | Status: CP
Start: 2022-11-08 — End: ?
  Administered 2022-11-08 (×2): 260.5 mg via INTRAVENOUS

## 2022-11-08 MED ORDER — ALTEPLASE 2 MG IK SOLR
2 mg | Freq: Once | INTRAMUSCULAR | 0 refills | Status: DC
Start: 2022-11-08 — End: 2022-11-08

## 2022-11-08 MED ORDER — IRINOTECAN IVPB
110 mg/m2 | Freq: Once | INTRAVENOUS | 0 refills | Status: CP
Start: 2022-11-08 — End: ?
  Administered 2022-11-08 (×3): 166.2 mg via INTRAVENOUS

## 2022-11-08 MED ORDER — ATROPINE 0.4 MG/ML IJ SOLN
.4 mg | INTRAVENOUS | 0 refills | Status: AC | PRN
Start: 2022-11-08 — End: ?
  Administered 2022-11-08: 16:00:00 0.4 mg via INTRAVENOUS

## 2022-11-08 MED ORDER — PALONOSETRON 0.25 MG/5 ML IV SOLN
.25 mg | Freq: Once | INTRAVENOUS | 0 refills
Start: 2022-11-08 — End: ?

## 2022-11-08 MED ORDER — MAGNESIUM SULFATE IN D5W 1 GRAM/100 ML IV PGBK (CC ONLY)
1 g | INTRAVENOUS | 0 refills | Status: CP
Start: 2022-11-08 — End: ?
  Administered 2022-11-08 (×2): 1 g via INTRAVENOUS

## 2022-11-27 ENCOUNTER — Encounter: Admit: 2022-11-27 | Discharge: 2022-11-27 | Payer: MEDICARE

## 2022-12-04 ENCOUNTER — Encounter: Admit: 2022-12-04 | Discharge: 2022-12-04 | Payer: MEDICARE

## 2022-12-06 ENCOUNTER — Encounter: Admit: 2022-12-06 | Discharge: 2022-12-06 | Payer: MEDICARE

## 2022-12-06 DIAGNOSIS — K219 Gastro-esophageal reflux disease without esophagitis: Secondary | ICD-10-CM

## 2022-12-06 DIAGNOSIS — C786 Secondary malignant neoplasm of retroperitoneum and peritoneum: Secondary | ICD-10-CM

## 2022-12-06 DIAGNOSIS — R112 Nausea with vomiting, unspecified: Secondary | ICD-10-CM

## 2022-12-06 DIAGNOSIS — C801 Malignant (primary) neoplasm, unspecified: Secondary | ICD-10-CM

## 2022-12-06 DIAGNOSIS — C189 Malignant neoplasm of colon, unspecified: Secondary | ICD-10-CM

## 2022-12-06 DIAGNOSIS — I454 Nonspecific intraventricular block: Secondary | ICD-10-CM

## 2022-12-06 DIAGNOSIS — C181 Malignant neoplasm of appendix: Secondary | ICD-10-CM

## 2022-12-06 LAB — COMPREHENSIVE METABOLIC PANEL: SODIUM: 139 MMOL/L (ref 137–147)

## 2022-12-06 LAB — CEA(CARCINOEMBRYONIC AG): CEA: 603 ng/mL — ABNORMAL HIGH (ref <3.0)

## 2022-12-06 LAB — CBC AND DIFF
ABSOLUTE BASO COUNT: 0.1 10*3/uL (ref 0–0.20)
ABSOLUTE EOS COUNT: 0.4 10*3/uL (ref 0–0.45)
ABSOLUTE LYMPH COUNT: 2.8 10*3/uL (ref 1.0–4.8)
ABSOLUTE MONO COUNT: 1.4 10*3/uL — ABNORMAL HIGH (ref 0–0.80)
ABSOLUTE NEUTROPHIL: 5.2 10*3/uL (ref 1.8–7.0)
BASOPHILS %: 1 % — ABNORMAL LOW (ref >60)
EOSINOPHILS %: 4 % (ref 0–5)
LYMPHOCYTES %: 28 % — ABNORMAL HIGH (ref 24–44)
MCH: 33 pg (ref 26–34)
MCHC: 33 g/dL — ABNORMAL HIGH (ref 32.0–36.0)
MCV: 101 FL — ABNORMAL HIGH (ref 80–100)
MONOCYTES %: 14 % — ABNORMAL HIGH (ref 4–12)
MPV: 7.3 FL — ABNORMAL HIGH (ref 7–11)
NEUTROPHILS %: 53 % (ref 41–77)
PLATELET COUNT: 464 10*3/uL — ABNORMAL HIGH (ref 150–400)
RDW: 14 % (ref 11–15)
WBC COUNT: 9.8 10*3/uL (ref 4.5–11.0)

## 2022-12-06 LAB — CA19.9: CA 19-9: <1 U/mL — ABNORMAL LOW (ref <35)

## 2022-12-06 LAB — MAGNESIUM: MAGNESIUM: 1.2 mg/dL — ABNORMAL LOW (ref 1.6–2.6)

## 2022-12-06 MED ORDER — MAGNESIUM SULFATE IN D5W 1 GRAM/100 ML IV PGBK (CC ONLY)
1 g | INTRAVENOUS | 0 refills | Status: CP
Start: 2022-12-06 — End: ?
  Administered 2022-12-06 (×2): 1 g via INTRAVENOUS

## 2022-12-06 MED ORDER — APREPITANT 130 MG/18 ML (7.2 MG/ML) IV EMUL
130 mg | Freq: Once | INTRAVENOUS | 0 refills | Status: CP
Start: 2022-12-06 — End: ?
  Administered 2022-12-06: 18:00:00 130 mg via INTRAVENOUS

## 2022-12-06 MED ORDER — BEVACIZUMAB-BVZR IVPB
5 mg/kg | Freq: Once | INTRAVENOUS | 0 refills | Status: CP
Start: 2022-12-06 — End: ?
  Administered 2022-12-06 (×2): 260.5 mg via INTRAVENOUS

## 2022-12-06 MED ORDER — FLUOROURACIL IV AMB PUMP
2200 mg/m2 | Freq: Once | INTRAVENOUS | 0 refills | Status: CP
Start: 2022-12-06 — End: ?
  Administered 2022-12-06 (×3): 3322 mg via INTRAVENOUS

## 2022-12-06 MED ORDER — IRINOTECAN IVPB
110 mg/m2 | Freq: Once | INTRAVENOUS | 0 refills | Status: CP
Start: 2022-12-06 — End: ?
  Administered 2022-12-06 (×3): 166.2 mg via INTRAVENOUS

## 2022-12-06 MED ORDER — PALONOSETRON 0.25 MG/5 ML IV SOLN
.25 mg | Freq: Once | INTRAVENOUS | 0 refills | Status: CP
Start: 2022-12-06 — End: ?
  Administered 2022-12-06: 17:00:00 0.25 mg via INTRAVENOUS

## 2022-12-06 MED ORDER — ATROPINE 0.4 MG/ML IJ SOLN
.4 mg | INTRAVENOUS | 0 refills | Status: AC | PRN
Start: 2022-12-06 — End: ?
  Administered 2022-12-06: 19:00:00 0.4 mg via INTRAVENOUS

## 2022-12-06 MED ORDER — SODIUM CHLORIDE 0.9 % IV SOLP
1000 mL | Freq: Once | INTRAVENOUS | 0 refills | Status: CP
Start: 2022-12-06 — End: ?
  Administered 2022-12-06: 17:00:00 1000 mL via INTRAVENOUS

## 2022-12-06 NOTE — Progress Notes
Patient arrived to [facility] treatment for C36D1 Zirabev/Irinotecan/5FU. Patient seen in clinic by Dr Josiah Lobo prior to appointment; see clinic note for assessment details.     [employer] Note  Verified consent signed and in chart.    Blood return positive via: Port (Single, Power Port, and Accessed)    BSA and dose double checked (agree with orders as written) with: yes see eMAR    Labs/applicable tests checked: CBC and Comprehensive Metabolic Panel (CMP)    Treatment regimen: C36D1  bevacizumab-bvzr (ZIRABEV) 260.5 mg in sodium chloride 0.9% (NS) 110.42 mL (*) IVPB     irinotecan (CAMPTOSAR) 166.2 mg in dextrose 5% (D5W) 508.31 mL IVPB     fluorouraciL (ADRUCIL) 3,322 mg in sodium chloride 0.9% (NS) 92 mL IV amb pump     Rate verified and armband double check with second RN: yes    Patient education offered and stated understanding. Denies questions at this time.    All treatment parameters met. Premeds given per treatment plan. Zirabev and Irinotecan infused without incident. Port positive for blood return, flushed with saline, and currently infusing 5FU via ambulatory pump. All connections secured, tubing unclamped, and face of pump reads infusing. All questions and concerns addressed. Patient left treatment in stable condition.

## 2022-12-08 ENCOUNTER — Encounter: Admit: 2022-12-08 | Discharge: 2022-12-08 | Payer: MEDICARE

## 2022-12-25 ENCOUNTER — Encounter: Admit: 2022-12-25 | Discharge: 2022-12-25 | Payer: MEDICARE

## 2022-12-25 MED ORDER — PALONOSETRON 0.25 MG/5 ML IV SOLN
.25 mg | Freq: Once | INTRAVENOUS | 0 refills
Start: 2022-12-25 — End: ?

## 2022-12-25 MED ORDER — ATROPINE 0.4 MG/ML IJ SOLN
0.4 mg | INTRAVENOUS | 0 refills | PRN
Start: 2022-12-25 — End: ?

## 2022-12-27 ENCOUNTER — Encounter: Admit: 2022-12-27 | Discharge: 2022-12-27 | Payer: MEDICARE

## 2022-12-31 ENCOUNTER — Encounter: Admit: 2022-12-31 | Discharge: 2022-12-31 | Payer: MEDICARE

## 2023-01-02 ENCOUNTER — Encounter: Admit: 2023-01-02 | Discharge: 2023-01-02 | Payer: MEDICARE

## 2023-01-02 DIAGNOSIS — C786 Secondary malignant neoplasm of retroperitoneum and peritoneum: Secondary | ICD-10-CM

## 2023-01-03 ENCOUNTER — Encounter: Admit: 2023-01-03 | Discharge: 2023-01-03 | Payer: MEDICARE

## 2023-01-03 NOTE — Telephone Encounter
Lesle Reek, RN with Dr. Alona Bene (PCP) office called to report that patient had labs, chest x-ray and receiving IVF at their facility today. Chest x-ray showed pneumonia and her WBC is 32.81. Dr. Alona Bene wanted to see if Dr. Josiah Lobo would recommend PO antibiotics or if he thought patient needed to be admitted for IV antibiotics.    RN called Barb back at [phone] letting her know Dr. Josiah Lobo was fine with patient getting PO antibiotics. Barb v/u and stated Dr. Alona Bene to start patient on augmentin and levaquin.

## 2023-01-08 ENCOUNTER — Encounter: Admit: 2023-01-08 | Discharge: 2023-01-08 | Payer: MEDICARE

## 2023-01-08 DIAGNOSIS — I454 Nonspecific intraventricular block: Secondary | ICD-10-CM

## 2023-01-08 DIAGNOSIS — K831 Obstruction of bile duct: Secondary | ICD-10-CM

## 2023-01-08 DIAGNOSIS — J189 Pneumonia, unspecified organism: Secondary | ICD-10-CM

## 2023-01-08 DIAGNOSIS — C801 Malignant (primary) neoplasm, unspecified: Secondary | ICD-10-CM

## 2023-01-08 DIAGNOSIS — C189 Malignant neoplasm of colon, unspecified: Secondary | ICD-10-CM

## 2023-01-08 DIAGNOSIS — K219 Gastro-esophageal reflux disease without esophagitis: Secondary | ICD-10-CM

## 2023-01-08 DIAGNOSIS — Z4689 Encounter for fitting and adjustment of other specified devices: Secondary | ICD-10-CM

## 2023-01-08 DIAGNOSIS — C181 Malignant neoplasm of appendix: Secondary | ICD-10-CM

## 2023-01-10 ENCOUNTER — Encounter: Admit: 2023-01-10 | Discharge: 2023-01-10 | Payer: MEDICARE

## 2023-01-22 ENCOUNTER — Encounter: Admit: 2023-01-22 | Discharge: 2023-01-22 | Payer: MEDICARE

## 2023-01-22 MED ORDER — ATROPINE 0.4 MG/ML IJ SOLN
0.4 mg | INTRAVENOUS | 0 refills | PRN
Start: 2023-01-22 — End: ?

## 2023-01-22 MED ORDER — PALONOSETRON 0.25 MG/5 ML IV SOLN
.25 mg | Freq: Once | INTRAVENOUS | 0 refills
Start: 2023-01-22 — End: ?

## 2023-01-25 ENCOUNTER — Ambulatory Visit: Admit: 2023-01-25 | Discharge: 2023-01-25 | Payer: MEDICARE

## 2023-01-25 ENCOUNTER — Encounter: Admit: 2023-01-25 | Discharge: 2023-01-25 | Payer: MEDICARE

## 2023-01-25 ENCOUNTER — Ambulatory Visit: Admit: 2023-01-25 | Discharge: 2023-01-26 | Payer: MEDICARE

## 2023-01-25 MED ORDER — ONDANSETRON HCL (PF) 4 MG/2 ML IJ SOLN
INTRAVENOUS | 0 refills | Status: DC
Start: 2023-01-25 — End: 2023-01-25

## 2023-01-25 MED ORDER — SUCCINYLCHOLINE CHLORIDE 20 MG/ML IJ SOLN
INTRAVENOUS | 0 refills | Status: DC
Start: 2023-01-25 — End: 2023-01-25

## 2023-01-25 MED ORDER — PROPOFOL INJ 10 MG/ML IV VIAL
INTRAVENOUS | 0 refills | Status: DC
Start: 2023-01-25 — End: 2023-01-25

## 2023-01-25 MED ORDER — ARTIFICIAL TEARS (PF) SINGLE DOSE DROPS GROUP
OPHTHALMIC | 0 refills | Status: DC
Start: 2023-01-25 — End: 2023-01-25

## 2023-01-25 MED ORDER — DEXAMETHASONE SODIUM PHOSPHATE 4 MG/ML IJ SOLN
INTRAVENOUS | 0 refills | Status: DC
Start: 2023-01-25 — End: 2023-01-25

## 2023-01-25 MED ORDER — FENTANYL CITRATE (PF) 50 MCG/ML IJ SOLN
INTRAVENOUS | 0 refills | Status: DC
Start: 2023-01-25 — End: 2023-01-25

## 2023-01-25 MED ORDER — PHENYLEPHRINE HCL IN 0.9% NACL 1 MG/10 ML (100 MCG/ML) IV SYRG
INTRAVENOUS | 0 refills | Status: DC
Start: 2023-01-25 — End: 2023-01-25

## 2023-01-25 MED ORDER — LACTATED RINGERS IV SOLP
INTRAVENOUS | 0 refills | Status: DC
Start: 2023-01-25 — End: 2023-01-25

## 2023-01-25 NOTE — Anesthesia Procedure Notes
Procedure: Airway Placement    AIRWAY INSERTION    Date/Time: 01/25/2023 10:08 AM    Patient location: OR  Urgency: elective  Difficult Airway: No            Airway Procedure  Indication(s) for airway management: surgery        no    Preoxygenated: yes  Patient position: sniffing    Mask difficulty assessment: 0 - not attempted      Procedure Outcome  Final airway type: endotracheal airway  Endotracheal airway: ETT          ETT size (mm): 7.0  Technique used for successful ETT placement: direct laryngoscopy  Devices/methods used in placement: intubating stylet and cricoid pressure  Insertion site: oral  Blade type: Macintosh   Laryngoscope/Videolaryngoscope blade size: 3  Cormack-Lehane classification: grade IIa - partial view of glottis      Measured from: lips   Depth: 22 cm    Number of attempts at approach: 1  Placement verified by auscultation and capnometry            Complications  Cardiovascular:   Pulmonary:   Procedure: airway not difficult  Medication:         Performed by: Rogelia Rohrer, MD  Authorized by: Rogelia Rohrer, MD

## 2023-01-25 NOTE — Anesthesia Post-Procedure Evaluation
Post-Anesthesia Evaluation    Name: Radiance Devoto      MRN: [id number]     DOB: September 18, 1955     Age: 67 y.o.     Sex: female   __________________________________________________________________________     Procedure Information       Anesthesia Start Date/Time: 01/25/23 0955    Procedure: ENDOSCOPIC RETROGRADE CHOLANGIOPANCREATOGRAPHY WITH REMOVAL AND EXCHANGE OF STENT BILIARY/ PANCREATIC DUCT - EACH STENT EXCHANGED    Location: ENDO 2 / ENDO/GI    Surgeons: Eliott Nine, MD            Post-Anesthesia Vitals  BP: 143/62 (11/07 1130)  Temp: 36.1 ?C (96.9 ?F) (11/07 1105)  Pulse: 78 (11/07 1140)  Respirations: 18 PER MINUTE (11/07 1140)  SpO2: 95 % (11/07 1140)  O2 Device: None (Room air) (11/07 1130)   Vitals Value Taken Time   BP 143/62 01/25/23 1130   Temp 36.1 ?C (96.9 ?F) 01/25/23 1105   Pulse 78 01/25/23 1140   Respirations 18 PER MINUTE 01/25/23 1140   SpO2 95 % 01/25/23 1140   O2 Device None (Room air) 01/25/23 1130   ABP     ART BP           Post Anesthesia Evaluation Note    Evaluation location: Pre/Post  Patient participation: recovered; patient participated in evaluation  Level of consciousness: alert  Pain management: adequate    Hydration: normovolemia  Temperature: 36.0?C - 38.4?C  Airway patency: adequate    Perioperative Events       Post-op nausea and vomiting: no PONV    Postoperative Status  Cardiovascular status: hemodynamically stable  Respiratory status: spontaneous ventilation  Follow-up needed: none        Perioperative Events  There were no known complications for this encounter.

## 2023-01-25 NOTE — Anesthesia Pre-Procedure Evaluation
Anesthesia Pre-Procedure Evaluation    Name: Vicki Buchanan      MRN: [id number]     DOB: 07/09/1955     Age: 67 y.o.     Sex: female   _________________________________________________________________________     Procedure Info:   Procedure Information       Date/Time: 01/25/23 1000    Procedure: ENDOSCOPIC RETROGRADE CHOLANGIOPANCREATOGRAPHY WITH REMOVAL AND EXCHANGE OF STENT BILIARY/ PANCREATIC DUCT - EACH STENT EXCHANGED    Location: ENDO 2 / ENDO/GI    Surgeons: Eliott Nine, MD            Physical Assessment  Vital Signs (last filed in past 24 hours):         Patient History   Allergies   Allergen Reactions    Indomethacin SEE COMMENTS     Pain for several days after suppository post ERCP. Tolerated IV but not PR    Shrimp SEE COMMENTS     Eye irritation. Namely blood vessel issues in the eyes         Current Medications    Medication Directions   apap-maalox-lidocaine 1:3:1 oral suspension (MIXTURE COMPOUND) Take 30 mL by mouth every 4 hours as needed.   calcium carbonate (TUMS) 500 mg (200 mg elemental calcium) chewable tablet Chew one tablet by mouth every 6 hours as needed.   carvediloL (COREG) 6.25 mg tablet Take one tablet by mouth twice daily. Take with food.  Hold if systolic blood pressure < 110   Cetirizine (ZYRTEC) 10 mg cap Take one capsule by mouth as Needed.   ciprofloxacin (CIPRO) 250 mg tablet Take one tablet by mouth twice daily. Pt states she is taking this to prevent UTI, she takes 1 tablet every 3rd day   diphenoxylate-atropine (LOMOTIL) 2.5-0.025 mg tablet Take one tablet by mouth four times daily as needed for Diarrhea.   esomeprazole DR (NEXIUM) 20 mg capsule Take one capsule by mouth daily. Take on an empty stomach at least 1 hour before or 2 hours after food.   loperamide (IMODIUM A-D) 2 mg capsule Take one pill by mouth four times daily as needed   magnesium oxide 400 mg magnesium tablet Take one tablet by mouth twice daily.  Patient taking differently: Take one tablet by mouth twice daily as needed.   ondansetron (ZOFRAN ODT) 8 mg rapid dissolve tablet Dissolve one tablet by mouth every 8 hours as needed for Nausea or Vomiting. Place on tongue to disolve.   potassium chloride (KLOR-CON SPRINKLE) 10 mEq capsule Take one capsule by mouth twice daily. If potassium < 3.5. Take with a meal and a full glass of water.   sod chlor,sod bicarb/neti pot (SINUS WASH NETI POT SINI) Use as directed   sodium chloride 0.9% (NS) 0.9 % 1,000 mL with potassium chloride 2 mEq/mL 20 mEq, magnesium sulfate 4 mEq/mL (50 %) 2 g IV infusion Administer 1L NS with K and 2g Mg once weekly via outpatient infusion.   traMADoL (ULTRAM) 50 mg tablet Take one tablet to two tablets by mouth daily as needed for Pain.       Review of Systems/Medical History      Patient summary reviewed  Pertinent labs reviewed    PONV Screening: Non-smoker and Female sex    No family history of anesthetic complications      Airway - negative        Previous grade 1 view with MAC 3      Pulmonary  Not a current smoker        No indications/hx of asthma      no COPD       Pneumonia (2.5 weeks ago, was given abx)        Recent URI, resolved      No shortness of breath        No Obstructive Sleep Apnea      Lung metastases s/p radiation      Cardiovascular         Exercise tolerance: >4 METS      Beta Blocker therapy: No      Beta blockers within 24 hours: n/a      Hypertension, well controlled          No past MI        No PTCA        No palpitations      No angina        GI/Hepatic/Renal             GERD, well controlled        Liver disease (biliary obstruction):         Renal disease: CKD Stage 3 (eGFR 30-59)         No nausea        S/p HIPEC, 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  Daily Nexium for GERD         Neuro/Psych       No seizures      No hx TIA      Neuropathy      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       Musculoskeletal - negative        No back pain        Endocrine/Other       No diabetes        No hypothyroidism      No hyperthyroidism      Anemia        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    Abdominal Findings:         Abdomen soft    Neurological Findings:       Alert and oriented x 3    Constitutional findings:       No acute distress       Previous Airway Procedure Notes Displaying the 3 most recent records      Date Mask Difficulty Techninque used for succesful ETT placement Blade Type Blade Size View with successful placement Number of attempts    11/02/22 0 - not attempted direct laryngoscopy Macintosh 3 grade I - full view of glottis 1    07/27/22 0 - not attempted Not documented Not documented 3 Not documented 1    03/30/22 1 -  vent by mask direct laryngoscopy Macintosh 3 grade I - full view of glottis 1            Patient Lines/Drains/Airways Status       Active Lines:       Name Placement date Placement time Site Days    Implantable Port-a-cath Single Lumen 04/23/17 0845 Subclavian, Left 04/23/17  0845  Subclavian, Left  2103    Ileostomy 04/03/17 0951 Lower Right Quadrant 04/03/17  0951  Lower Right Quadrant  2123                  Diagnostic Tests  Hematology:   Lab Results   Component Value Date    HGB 9.0 01/25/2023    HCT 26.7 01/25/2023    PLTCT 469 01/25/2023    WBC 9.6 01/25/2023    NEUT 46 01/25/2023    ANC 4.49 01/25/2023    LYMPH 36 11/02/2022    ALC 2.24 01/25/2023    MONA 16 01/25/2023    AMC 1.53 01/25/2023    EOSA 14 01/25/2023    ABC 0.06 01/25/2023    BASOPHILS 1 11/02/2022    MCV 101.9 01/25/2023    MCH 34.4 01/25/2023    MCHC 33.8 01/25/2023    MPV 7.5 01/25/2023    RDW 15.3 01/25/2023         General Chemistry:   Lab Results   Component Value Date    NA 139 12/06/2022    K 3.9 12/06/2022    CL 96 12/06/2022    CO2 33 12/06/2022    GAP 10 12/06/2022    BUN 32 12/06/2022    CR 2.97 12/06/2022    GLU 96 12/06/2022    CA 8.9 12/06/2022    ALBUMIN 3.8 12/06/2022    MG 1.2 12/06/2022    TOTBILI 0.4 12/06/2022    PO4 3.3 01/30/2022      Coagulation:   Lab Results   Component Value Date    PT 13.1 11/02/2022    PTT 28.7 07/10/2018    INR 1.2 11/02/2022       PAC Plan    Anesthesia Plan    ASA score: 3   Plan: general  NPO status: acceptable      Informed Consent  Anesthetic plan and risks discussed with patient.  Use of blood products discussed with patient  Blood Consent: consented      Plan discussed with: anesthesiologist, CRNA and surgeon/proceduralist.      Alerts

## 2023-01-26 ENCOUNTER — Encounter: Admit: 2023-01-26 | Discharge: 2023-01-26 | Payer: MEDICARE

## 2023-01-26 MED ORDER — TRAMADOL 50 MG PO TAB
1-2 | ORAL_TABLET | Freq: Every day | ORAL | 0 refills | Status: AC | PRN
Start: 2023-01-26 — End: ?

## 2023-01-26 MED ORDER — NITROFURANTOIN MONOHYD/M-CRYST 100 MG PO CAP
100 mg | ORAL_CAPSULE | Freq: Two times a day (BID) | ORAL | 0 refills | 7.00000 days | Status: AC
Start: 2023-01-26 — End: ?

## 2023-01-26 MED ORDER — DIPHENOXYLATE-ATROPINE 2.5-0.025 MG PO TAB
1 | ORAL_TABLET | Freq: Four times a day (QID) | ORAL | 3 refills | 15.00000 days | Status: AC | PRN
Start: 2023-01-26 — End: ?

## 2023-01-26 NOTE — Progress Notes
Date of Service: 01/31/2023    SUBJECTIVE:             Reason for Visit:  Follow Up    Vicki Buchanan is a 67 y.o. 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  02/19/17 diagnostic laparoscopy with biopsies of the right diaphragm, right liver, right fallopian tube and left pelvis.  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  Planned for exploratory laparotomy with CRS/HIPEC in combination with a TAH/BSO in 03/2017.  Developed intestinal obstruction and taken to OR for CRS/HIPEC surgery; however multiple adhesions, omental cracking. CRS/HIPEC was aborted.  Immunotherapy on epacadostat + sirolimus trial 12/24/17-05/21/18.  She stopped study drug on 05/21/18 for mitomycin HIPEC trial at [facility] [place].  On 05/28/18 diagnostic laparoscopy, evacuation of mucinous ascites 1.5L, peritoneal biopsy, laparoscopic HIPEC with mitomycin-C in [place] by Dr. Daryl Eastern.  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  Completed second laparoscopic HIPEC on 06/25/18.  Again 1.5L removed.  09/12/18 underwent debulking at [facility] in [place], [place].  Removed 98% by Dr. [name].  Discharged on 09/30/18.  She had L pleural effusion requiring chest tube drainage; intra-abd abscess 10/2018; discharged on 11/04/18 with drain and IV cefepime and Diflucan. Her ureteral stents were removed and ostomy relocated in 10/2018.  Hospitalized in 01/2019 for AKI 2/2 dehydration and likely PE (V/Q scan and trop elevation). CEA increasing again. Repeat CT CAP 03/20/19 with stable to slight increase in cancer and RUL nodule.  Discussed observation vs treatment. She chose observation and is considering repeat CRS + HIPEC via Dr. [name] in [place].  CT scans from 09/24/19 of the abdomen, without contrast, appear stable.  The peritoneal thickening, carcinomatosis, retroperitoneal lymph nodes, a small amount of fluid collection or trace ascites, appear to be stable compared to 3 months ago. 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  CT scan on 08/17/20 showed a right lung mass that is slightly increased from 11 mm to 14 mm. There are some new lung nodules that have developed, a small one in the left lung, a new nodule in the right lung. Inside the abdomen we are seeing continued increase in size of the peritoneal nodules. The largest measuring 14 mm compared to 12 mm.  The nodule lower in the pelvis increased from 16 mm to 21 mm.  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  TMB = 1.3.  S/p CRS w/ ostomy revision and tumor removal around stoma on 10/14/20.  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 Vicki Money PA from [facility] in [place] called to relay their TB recommendations for patient.  Recommendations to proceed with FOLFIRI/Avastin with holding Avastin 6-8 weeks post-op. They also request if our office can repeat genomic testing.  Repeat TEMPUS from 10/14/20.  Pre-screen for KRAS G12D inhibitor trial, if not eligible will plan for FOLFIRI +/- bevacizumab.      Interval Hx 01/31/23:     The patient reports a recent episode of pneumonia. She presents for follow-up and next chemotherapy. She reports feeling better overall but continues to experience fatigue. She notes that her strength is gradually returning. The patient has been receiving chemotherapy and recently had a liver stent replacement, which went well according to the patient. She also reports a recent fainting episode, which she attributes to dehydration. She continues  to get IVF at home for dehydration related to high output ostomy. Her white count was elevated and her UA suggests bladder infection, though she does not have any new symptoms.  The patient is planning to travel in the coming months and is discussing scheduling chemotherapy sessions around her travel plans.          Review of Systems   Constitutional:  Positive for fatigue. Negative for activity change, appetite change, chills, diaphoresis, fever and unexpected weight change.   HENT:  Negative for facial swelling, mouth sores, sore throat, trouble swallowing and voice change.    Eyes: Negative.  Negative for visual disturbance.   Respiratory:  Positive for cough. Negative for chest tightness, shortness of breath and wheezing.    Cardiovascular: Negative.  Negative for chest pain, palpitations and leg swelling.   Gastrointestinal:  Positive for diarrhea. Negative for abdominal distention, abdominal pain, anal bleeding, blood in stool, constipation, nausea, rectal pain and vomiting.   Genitourinary: Negative.  Negative for difficulty urinating, dysuria and hematuria.   Musculoskeletal: Negative.  Negative for arthralgias, back pain, neck pain and neck stiffness.   Skin:  Negative for color change, pallor, rash and wound.   Neurological:  Positive for dizziness and numbness. Negative for weakness, light-headedness and headaches.   Hematological:  Does not bruise/bleed easily.   Psychiatric/Behavioral: Negative.  Negative for agitation. The patient is not nervous/anxious.        Past Medical History:   Diagnosis Date    Acid reflux     Bundle branch block     Cancer (HCC)     Cancer of appendix (HCC)     Cancer of colon (HCC) 11/19    Pneumonia     01/01/23 on 2 abx     Surgical History:   Procedure Laterality Date    CESAREAN SECTION  1981    HX CHOLECYSTECTOMY  1995    LAPAROSCOPY  02/2017    biopsy peritoneal    COLONOSCOPY DIAGNOSTIC WITH SPECIMEN COLLECTION BY BRUSHING/ WASHING - FLEXIBLE N/A 03/19/2017    Performed by Everardo All, MD at [facility] ENDO    COLONOSCOPY WITH BIOPSY - FLEXIBLE  03/19/2017    Performed by Everardo All, MD at [facility] ENDO    EXPLORATORY LAPAROTOMY, DIVERTING LOOP ILEOSTOMY N/A 04/03/2017    Performed by Loralie Champagne, MD at [facility] OR    INSERTION TUNNELED CENTRAL VENOUS CATHETER - AGE 45 YEARS AND OVER Left 04/23/2017    Performed by Loralie Champagne, MD at [facility] OR    CYSTOURETHROSCOPY WITH INDWELLING URETERAL STENT INSERTION Bilateral 04/09/2018    Performed by Earl Many, MD at [facility] OR    CYSTOURETHROSCOPY WITH URETERAL CATHETERIZATION WITH/ WITHOUT IRRIGATION/ INSTILLATION/ URETEROPYELOGRAPHY Bilateral 04/09/2018    Performed by Earl Many, MD at [facility] OR    CYSTOURETHROSCOPY WITH INDWELLING URETERAL STENT EXCHANGE (RIGHT 6 x 26cm / LEFT 6 x 28cm) Bilateral 08/08/2018    Performed by Earl Many, MD at [facility] OR    RETROGRADE UROGRAPHY WITH/ WITHOUT KUB Bilateral 08/08/2018    Performed by Earl Many, MD at [facility] OR    ENDOSCOPIC RETROGRADE CHOLANGIOPANCREATOGRAPHY WITH SPHINCTEROTOMY/ PAPILLOTOMY N/A 09/16/2021    Performed by Vertell Novak, MD at [facility] ENDO    ESOPHAGOGASTRODUODENOSCOPY WITH ENDOSCOPIC ULTRASOUND EXAMINATION - FLEXIBLE  09/16/2021    Performed by Vertell Novak, MD at [facility] ENDO    ENDOSCOPIC RETROGRADE CHOLANGIOPANCREATOGRAPHY WITH PLACEMENT ENDOSCOPIC STENT INTO BILIARY/ PANCREATIC DUCT - EACH STENT  09/16/2021  Performed by Vertell Novak, MD at [facility] ENDO    COLONOSCOPY DIAGNOSTIC WITH SPECIMEN COLLECTION BY BRUSHING/ WASHING - FLEXIBLE N/A 09/26/2021    Performed by Vertell Novak, MD at [facility] OR    ENDOSCOPIC RETROGRADE CHOLANGIOPANCREATOGRAPHY WITH PLACEMENT ENDOSCOPIC STENT INTO BILIARY/ PANCREATIC DUCT - EACH STENT  09/29/2021    Performed by Eliott Nine, MD at [facility] ENDO    ENDOSCOPIC RETROGRADE CHOLANGIOPANCREATOGRAPHY with plastic stent exchange N/A 10/27/2021    Performed by Eliott Nine, MD at [facility] ENDO    ENDOSCOPIC RETROGRADE CHOLANGIOPANCREATOGRAPHY WITH ABLATION TUMOR/ POLYP/ OTHER LESION  10/27/2021    Performed by Eliott Nine, MD at [facility] ENDO    ENDOSCOPIC RETROGRADE CHOLANGIOPANCREATOGRAPHY WITH REMOVAL CALCULI/ DEBRIS FROM BILIARY/ PANCREATIC DUCT  10/27/2021    Performed by Eliott Nine, MD at [facility] ENDO    ENDOSCOPIC RETROGRADE CHOLANGIOPANCREATOGRAPHY  plastic stent replacement N/A 12/22/2021    Performed by Eliott Nine, MD at [facility] ENDO    ENDOSCOPIC RETROGRADE CHOLANGIOPANCREATOGRAPHY N/A 03/30/2022    Performed by Eliott Nine, MD at [facility] ENDO    ENDOSCOPIC RETROGRADE CHOLANGIOPANCREATOGRAPHY WITH REMOVAL AND EXCHANGE OF STENT BILIARY/ PANCREATIC DUCT - EACH STENT EXCHANGED N/A 03/30/2022    Performed by Eliott Nine, MD at [facility] ENDO    ENDOSCOPIC RETROGRADE CHOLANGIOPANCREATOGRAPHY WITH REMOVAL AND EXCHANGE OF STENT BILIARY/ PANCREATIC DUCT - EACH STENT EXCHANGED N/A 07/27/2022    Performed by Eliott Nine, MD at [facility] ENDO    ENDOSCOPIC RETROGRADE CHOLANGIOPANCREATOGRAPHY N/A 11/02/2022    Performed by Eliott Nine, MD at [facility] ENDO    ENDOSCOPIC RETROGRADE CHOLANGIOPANCREATOGRAPHY WITH REMOVAL AND EXCHANGE OF STENT BILIARY/ PANCREATIC DUCT - EACH STENT EXCHANGED N/A 11/02/2022    Performed by Eliott Nine, MD at [facility] ENDO    ENDOSCOPIC RETROGRADE CHOLANGIOPANCREATOGRAPHY WITH REMOVAL AND EXCHANGE OF STENT BILIARY/ PANCREATIC DUCT - EACH STENT EXCHANGED N/A 01/25/2023    Performed by Eliott Nine, MD at [facility] ENDO    COLON SURGERY  7/20    COLON SURGERY  7/20    HX APPENDECTOMY  1/10    Guess on date    HX CESAREAN SECTION      HX HYSTERECTOMY  7/20    HX LOWER ANTERIOR RESECTION OF COLON      7/20    HX SALPINGO-OOPHORECTOMY      ILEOSTOMY OR JEJUNOSTOMY Right     LIVER DONOR SURGERY  12/24/21    PORTACATH PLACEMENT  1/20    PR PNCRTECT PROX STOT W/PANCREATOJEJUNOSTOMY  7/20    TUNNELED VENOUS PORT PLACEMENT Left     URETER STENT PLACEMENT Bilateral      Family History   Problem Relation Name Age of Onset    Diabetes Mother Damian Leavell     Cancer Father Molly Maduro     Diabetes Brother Nadine Counts     Cancer-Lung Paternal Aunt Doris     Cancer-Breast Maternal Grandmother Myriam Jacobson     Diabetes Paternal Grandmother Annabelle      Social History     Socioeconomic History    Marital status: Married   Tobacco Use    Smoking status: Never    Smokeless tobacco: Never   Vaping Use    Vaping status: Never Used Substance and Sexual Activity    Alcohol use: No     Comment: rarely    Drug use: No    Sexual activity: Yes     Partners: Female     Birth control/protection: Post-menopausal         OBJECTIVE:  apap-maalox-lidocaine 1:3:1 oral suspension (MIXTURE COMPOUND) Take 30 mL by mouth every 4 hours as needed.    calcium carbonate (TUMS) 500 mg (200 mg elemental calcium) chewable tablet Chew one tablet by mouth every 6 hours as needed.    carvediloL (COREG) 6.25 mg tablet Take one tablet by mouth twice daily. Take with food.  Hold if systolic blood pressure < 110    Cetirizine (ZYRTEC) 10 mg cap Take one capsule by mouth as Needed.    ciprofloxacin (CIPRO) 250 mg tablet Take one tablet by mouth twice daily. Pt states she is taking this to prevent UTI, she takes 1 tablet every 3rd day    diphenoxylate-atropine (LOMOTIL) 2.5-0.025 mg tablet Take one tablet by mouth four times daily as needed for Diarrhea.    esomeprazole DR (NEXIUM) 20 mg capsule Take one capsule by mouth daily. Take on an empty stomach at least 1 hour before or 2 hours after food.    loperamide (IMODIUM A-D) 2 mg capsule Take one pill by mouth four times daily as needed    magnesium oxide 400 mg magnesium tablet Take one tablet by mouth twice daily. (Patient taking differently: Take one tablet by mouth twice daily as needed.)    nitrofurantoin monohyd/m-cryst (MACROBID) 100 mg capsule Take one capsule by mouth every 12 hours for 7 days. Take with food.  Indications: genitourinary tract infections    ondansetron (ZOFRAN ODT) 8 mg rapid dissolve tablet Dissolve one tablet by mouth every 8 hours as needed for Nausea or Vomiting. Place on tongue to disolve.    potassium chloride (KLOR-CON SPRINKLE) 10 mEq capsule Take one capsule by mouth twice daily. If potassium < 3.5. Take with a meal and a full glass of water.    sod chlor,sod bicarb/neti pot (SINUS WASH NETI POT SINI) Use as directed    sodium chloride 0.9% (NS) 0.9 % 1,000 mL with potassium chloride 2 mEq/mL 20 mEq, magnesium sulfate 4 mEq/mL (50 %) 2 g IV infusion Administer 1L NS with K and 2g Mg once weekly via outpatient infusion.    traMADoL (ULTRAM) 50 mg tablet Take one tablet to two tablets by mouth daily as needed for Pain.     Vitals:    01/31/23 0747   BP: (!) 127/91   BP Source: Arm, Left Upper   Pulse: 85   Temp: 36.5 ?C (97.7 ?F)   Resp: 16   SpO2: 98%   TempSrc: Temporal   PainSc: Zero   Weight: 49.6 kg (109 lb 6.4 oz)               Body mass index is 20.01 kg/m?Marland Kitchen     Pain Score: Zero       Pain Addressed:  N/A    Patient Evaluated for a Clinical Trial: Patient not eligible for a treatment trial (including not needing treatment, needs palliative care, in remission).     [place] Cooperative Oncology Group performance status is 1, Restricted in physically strenuous activity but ambulatory and able to carry out work of a light or sedentary nature, e.g., light house work, office work.       Physical Exam  Vitals reviewed.   Constitutional:       General: She is not in acute distress.     Appearance: Normal appearance. She is well-developed. She is not ill-appearing, toxic-appearing or diaphoretic.      Comments: Hair thinning.    HENT:      Head: Normocephalic and atraumatic.  Nose: Nose normal. No rhinorrhea.      Mouth/Throat:      Mouth: Mucous membranes are moist. Mucous membranes are not pale. No oral lesions.      Pharynx: Oropharynx is clear. No oropharyngeal exudate or posterior oropharyngeal erythema.      Tonsils: No tonsillar abscesses.   Eyes:      General: No scleral icterus.        Right eye: No discharge.         Left eye: No discharge.      Extraocular Movements: Extraocular movements intact.      Conjunctiva/sclera: Conjunctivae normal.      Pupils: Pupils are equal, round, and reactive to light.   Cardiovascular:      Rate and Rhythm: Normal rate and regular rhythm.      Pulses: Normal pulses.      Heart sounds: Normal heart sounds. No murmur heard.     No gallop. Pulmonary:      Effort: Pulmonary effort is normal. No respiratory distress.      Breath sounds: No stridor. No wheezing, rhonchi or rales.   Chest:      Chest wall: No tenderness.      Comments: Port site WNL  Abdominal:      General: A surgical scar is present. The ostomy site is clean. Bowel sounds are normal. There is no distension.      Palpations: There is no mass.      Tenderness: There is no abdominal tenderness. There is no guarding or rebound.      Hernia: No hernia is present.          Comments: Firmness in epigastric area.    Musculoskeletal:         General: No swelling, tenderness, deformity or signs of injury. Normal range of motion.      Cervical back: Normal range of motion and neck supple. No rigidity. No muscular tenderness.      Right lower leg: No edema.      Left lower leg: No edema.   Lymphadenopathy:      Cervical: No cervical adenopathy.   Skin:     General: Skin is warm and dry.      Coloration: Skin is not jaundiced or pale.      Findings: No bruising, erythema, lesion or rash.   Neurological:      General: No focal deficit present.      Mental Status: She is alert and oriented to person, place, and time. Mental status is at baseline.      Cranial Nerves: No cranial nerve deficit.      Motor: No weakness.      Coordination: Coordination normal.      Gait: Gait normal.   Psychiatric:         Mood and Affect: Mood normal.         Behavior: Behavior normal.         Thought Content: Thought content normal.         Judgment: Judgment normal.            CBC w/DIFF      Latest Ref Rng & Units 01/25/2023     8:13 AM 12/06/2022     7:09 AM 11/08/2022     8:02 AM 11/02/2022     7:53 AM 10/11/2022     7:17 AM   CBC with Diff   White Blood Cells 4.5 - 11.0 K/UL 9.6  9.8  10.4  8.3  9.8    RBC 4.0 - 5.0 M/UL 2.62  2.83  2.80  2.73  2.49    Hemoglobin 12.0 - 15.0 GM/DL 9.0  9.5  9.5  9.3  8.6    Hematocrit 36 - 45 % 26.7  28.7  29.2  28.8  26.5    MCV 80 - 100 FL 101.9  101.5  104.4  105.6  106.1    MCH 26 - 34 PG 34.4  33.5  34.0  34.1  34.5    MCHC 32.0 - 36.0 G/DL 47.8  29.5  62.1  30.8  32.6    RDW 11 - 15 % 15.3  14.6  15.2  15.8  16.7    Platelet Count 150 - 400 K/UL 469  464  520  517  472    MPV 7 - 11 FL 7.5  7.3  7.4  7.5  7.4    Neutrophils 41 - 77 % 46  53  57   51    Absolute Neutrophil Count 1.8 - 7.0 K/UL 4.49  5.20  5.90   5.00    Lymphocytes 24 - 44 % 23  28  21   27     Absolute Lymph Count 1.0 - 4.8 K/UL 2.24  2.80  2.20   2.60    Monocytes 4 - 12 % 16  14  17   16     Absolute Monocyte Count 0 - 0.80 K/UL 1.53  1.40  1.80   1.60    Eosinophils 0 - 5 % 14  4  4   5     Absolute Eosinophil Count 0 - 0.45 K/UL 1.30  0.40  0.50   0.50    Basophils 0 - 2 % 1  1  1   1     Absolute Basophil Count 0 - 0.20 K/UL 0.06  0.10  0.10   0.10      Comprehensive Metabolic Profile      Latest Ref Rng & Units 01/25/2023     8:13 AM 12/06/2022     7:09 AM 11/08/2022     8:02 AM 11/02/2022     7:53 AM 10/11/2022     7:17 AM   CMP   Sodium 137 - 147 MMOL/L 141  139  138  139  139    Potassium 3.5 - 5.1 MMOL/L 4.4  3.9  3.8  4.3  4.2    Chloride 98 - 110 MMOL/L 101  96  105  107  110    CO2 21 - 30 MMOL/L 29  33  25  21  19     Anion Gap 3 - 12 11  10  8  11  10     Blood Urea Nitrogen 7 - 25 MG/DL 34  32  26  22  26     Creatinine 0.4 - 1.00 MG/DL 6.57  8.46  9.62  9.52  2.48    Glucose 70 - 100 MG/DL 841  96  97  78  324    Calcium 8.5 - 10.6 MG/DL 9.6  8.9  8.9  9.4  9.0    Total Protein 6.0 - 8.0 G/DL 8.4  8.1  8.1  8.3  8.1    Albumin 3.5 - 5.0 G/DL 3.5  3.8  3.8  3.9  3.9    Alk Phosphatase 25 - 110 U/L 492  243  202  221  206    ALT (SGPT) 7 - 56  U/L 94  25  16  11  14     Total Bilirubin 0.2 - 1.3 MG/DL 1.5  0.4  0.3  0.4  0.4      Tumor Markers  Lab Results   Component Value Date    CEA 1,097.3 (H) 01/25/2023    CEA 603.6 (H) 12/06/2022    CEA 475.6 (H) 11/02/2022    CEA 462.4 (H) 10/11/2022    CEA 658.2 (H) 09/13/2022    CEA 700.6 (H) 08/24/2022    CEA 95.4 (H) 08/02/2022    CEA 91.3 (H) 07/19/2022    CEA 1,042.3 (H) 07/05/2022    CEA 1,175.5 (H) 06/21/2022    CA199 <1 01/25/2023    CA199 <1 12/06/2022    CA199 <1 11/02/2022    CA199 <1 10/11/2022    CA199 <1 09/13/2022    CA199 <1 08/24/2022    CA199 <1 08/02/2022    CA199 <1 07/19/2022    CA199 <1 07/05/2022    CA199 <1 06/21/2022    CA125 41 (H) 01/21/2018    CA125 42 (H) 11/21/2017    CA125 46 (H) 11/05/2017    CA125 41 (H) 10/22/2017    CA125 34 10/08/2017    CA125 34 09/24/2017    CA125 31 09/10/2017    CA125 29 08/27/2017    CA125 34 08/15/2017    CA125 39 (H) 07/30/2017           Imaging:  - 04/27/21 CT CAP without contrast  CHEST:   1.  No significant change in small indeterminate anterior epiphrenic lymph nodes. No new thoracic lymphadenopathy.   2. No significant change to further mild increase in size of pulmonary and pleural metastases.   3. Increasing nodular and tree-in-bud opacities in the lingula and development of mild focal consolidation in the left lower lobe, indeterminate and may be infectious/inflammatory nodules or potentially pulmonary metastases.       ABDOMEN AND PELVIS:   1.  Grossly unchanged extensive peritoneal carcinomatosis.   2. No significant change in small residual indeterminate retroperitoneal lymph nodes. No discrete new abdominopelvic lymphadenopathy.   3. Grossly unchanged right anterior abdominal wall metastases.     GI ENDO CATH BIL & PANC DUCT  This order has been auto finalized and does not contain a result.        ASSESSMENT AND PLAN:    1. Metastatic appendiceal carcinoma. Signet ring adenocarcinoma, MSS, with peritoneal carcinomatosis with  pseudomyxoma peritonei.  02/19/17 diagnostic laparoscopy with biopsies of the right diaphragm, right liver, right fallopian tube and left pelvis.  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  Planned for exploratory laparotomy with CRS/HIPEC in combination with a TAH/BSO in 03/2017.  Developed intestinal obstruction and taken to OR for CRS/HIPEC surgery; however multiple adhesions, omental cracking. CRS/HIPEC was aborted.  Immunotherapy on epacadostat + sirolimus trial 12/24/17-05/21/18.  She stopped study drug on 05/21/18 for mitomycin HIPEC trial at [facility] [place].  On 05/28/18 diagnostic laparoscopy, evacuation of mucinous ascites 1.5L, peritoneal biopsy, laparoscopic HIPEC with mitomycin-C in [place] by Dr. Daryl Eastern.  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  Completed second laparoscopic HIPEC on 06/25/18.  Again 1.5L removed.  09/12/18 underwent debulking at [facility] in [place], [place].  Removed 98% by Dr. [name].  Discharged on 09/30/18.  She had L pleural effusion requiring chest tube drainage; intra-abd abscess 10/2018; discharged on 11/04/18 with drain and IV cefepime and Diflucan. Her ureteral stents were removed and ostomy relocated in  10/2018.  Hospitalized in 01/2019 for AKI 2/2 dehydration and likely PE (V/Q scan and trop elevation). CEA increasing again. Repeat CT CAP 03/20/19 with stable to slight increase in cancer and RUL nodule.  Discussed observation vs treatment. She chose observation and is considering repeat CRS + HIPEC via Dr. [name] in [place].  CT scans from 09/24/19 of the abdomen, without contrast, appear stable.  The peritoneal thickening, carcinomatosis, retroperitoneal lymph nodes, a small amount of fluid collection or trace ascites, appear to be stable compared to 3 months ago. 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  CT scan on 08/17/20 showed a right lung mass that is slightly increased from 11 mm to 14 mm. There are some new lung nodules that have developed, a small one in the left lung, a new nodule in the right lung. Inside the abdomen we are seeing continued increase in size of the peritoneal nodules. The largest measuring 14 mm compared to 12 mm.  The nodule lower in the pelvis increased from 16 mm to 21 mm.  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  TMB = 1.3.  S/p CRS w/ ostomy revision and tumor removal around stoma on 10/14/20.  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 Vicki Money PA from [facility] in [place] called to relay their TB recommendations for patient.  Recommendations to proceed with FOLFIRI/Avastin with holding Avastin 6-8 weeks post-op. They also request if our office can repeat genomic testing.  Repeat TEMPUS from 10/14/20.  Pre-screen for KRAS G12D inhibitor trial, if not eligible will plan for FOLFIRI +/- bevacizumab. Tolerated C1 FOLFIRI but more diarrhea and N/V after C2. Decreased irinotecan to 130 mg/m2 due to diarrhea. S/p C4, with bevacizumab added on. CT scan 02/16/21 abdominal wall spots are under control and stable. There is a slight increase in size of the lung nodules. The radiologist is measuring 10 mm to 8 mm in 11/2020. The other right lung nodule is more or less the same at 15 mm. CT scans with stable disease, mild increase in size of pulmonary metastases (specifically the right upper lobe nodule 2.2cm, previously 1.5cm). There is also some increasing nodular and tree-in-bud opacities in the lingula and development of mild focal consolidation in the left lower lobe, indeterminate but may be related to recent COVID infection vs pulmonary metastases. Given overall stability and down-trending CEA, we will plan to continue with the current treatment regimen for now. Dr. Dorita Fray with Radiation Oncology reviewed imaging (already received 50Gy to right lung 3/1-05/25/20) and will continue to monitor. CT scan on 08/03/2021 shows the cancer inside the abdomen and the abdominal wall metastasis is remaining the same compared to the scans done in 04/2021. The lung metastasis appears slightly larger by about 2 mm. Abnormal labs, even on repeat. Chemo held. Ultrasound reveals moderate ductal dilatation. Refer to GI for ERCP. Continued elevation of liver enzymes and bilirubin. MRI MRCP performed on 09/07/2021 shows extrinsic compression of the common bile duct causing biliary dilatation, as well as both intrahepatic ducts on the right and left side. CT scan 09/28/2021 shows the cancer was overall stable. However, the fact that it is causing biliary obstruction, ureter obstruction, and hydronephrosis tells [place] that clinically it is growing. There was mild hydronephrosis on both kidneys. cycle 18 of FOLFIRI,  stent replacement in 3 weeks, will hold bevacizumab. In October, re-discuss decreasing her Irinotecan to every 4 weeks  if no worsening of labs and/or symptoms. CT scan on 12/12/2021 shows the peritoneal mass is stable. The dilation of the bile ducts has improved. The nodules in the lung are stable. There are new cluster of nodules which appear like inflammation. CT scan of the chest, abdomen, and pelvis performed on 03/30/2022, when compared shows that her lung nodules, abdominal wall, tumor in the abdomen, and the peritoneal carcinomatosis appears the same. CT scan of the chest, abdomen, and pelvis showed stable cancer, lung nodules, pleural nodules, abdominal wall nodule, and peritoneal metastasis.      Plan 01/31/23:        Patient ready to resume chemo.   -Continue FOLFIRI and bevacizumab 37 with future schedule adjustments for personal travel plans.  -Check magnesium levels today.  RTC in 4 weeks with labs and visit for next cycle.  continue every 4 week dosing.   -Next chemotherapy sessions scheduled for 02/28/2023, 03/28/2023, 05/09/2023, and 06/06/2023.  -CT scan scheduled for December 2024.  -Follow-up appointment with Dr. Josiah Lobo and Sherril Croon with surgery on 03/31/2023.    Liver Disease with Biliary Stent  Recent stent replacement with improved flow. No current complications reported. labs stable    Pneumonia  Recent episode with significant recovery. No current symptoms reported.  -Order follow-up chest x-ray today to ensure resolution.      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 Consider discussion with current urologist regarding need for new nephrologist following Dr. Earney Mallet retirement.    Syncope  Recent episode of fainting, possibly due to dehydration and rapid standing.  -Advise patient to hydrate adequately and rise slowly from sitting or lying positions to prevent orthostatic hypotension.     Hypomagnesemia: Chronic issue with low magnesium levels despite supplementation. No symptoms of muscle cramping reported.  -Increase magnesium supplementation to three times a day.    Biliary obstruction. Stent exchange: Scheduled for 15th August 2024.  -Continue with planned stent exchange.     Diarrhea. Ileostomy. Working well, labs stable, still requires supplements. Continue to monitor.             Turner Daniels, APRN-NP        The patient and family were allowed to ask questions and voice concerns; these were addressed to the best of our ability. They expressed understanding of what was explained to them, and they agreed with the present plan. RTC in 4 weeks with labs to see a provider. Patient has the phone numbers for the Cancer Center and was instructed on how to contact us with any questions or concerns. My collaborating physician on this case is Dr. Josiah Lobo.

## 2023-01-27 ENCOUNTER — Encounter: Admit: 2023-01-27 | Discharge: 2023-01-27 | Payer: MEDICARE

## 2023-01-29 ENCOUNTER — Encounter: Admit: 2023-01-29 | Discharge: 2023-01-29 | Payer: MEDICARE

## 2023-01-30 ENCOUNTER — Encounter: Admit: 2023-01-30 | Discharge: 2023-01-30 | Payer: MEDICARE

## 2023-01-30 DIAGNOSIS — K831 Obstruction of bile duct: Secondary | ICD-10-CM

## 2023-01-30 DIAGNOSIS — C181 Malignant neoplasm of appendix: Secondary | ICD-10-CM

## 2023-01-30 DIAGNOSIS — Z4659 Encounter for fitting and adjustment of other gastrointestinal appliance and device: Secondary | ICD-10-CM

## 2023-01-31 ENCOUNTER — Encounter: Admit: 2023-01-31 | Discharge: 2023-02-01 | Payer: MEDICARE

## 2023-01-31 ENCOUNTER — Encounter: Admit: 2023-01-31 | Discharge: 2023-01-31 | Payer: MEDICARE

## 2023-01-31 DIAGNOSIS — C786 Secondary malignant neoplasm of retroperitoneum and peritoneum: Secondary | ICD-10-CM

## 2023-01-31 DIAGNOSIS — J189 Pneumonia, unspecified organism: Secondary | ICD-10-CM

## 2023-01-31 DIAGNOSIS — K831 Obstruction of bile duct: Secondary | ICD-10-CM

## 2023-01-31 DIAGNOSIS — R112 Nausea with vomiting, unspecified: Secondary | ICD-10-CM

## 2023-01-31 DIAGNOSIS — Z4659 Encounter for fitting and adjustment of other gastrointestinal appliance and device: Secondary | ICD-10-CM

## 2023-01-31 DIAGNOSIS — C181 Malignant neoplasm of appendix: Secondary | ICD-10-CM

## 2023-01-31 LAB — COMPREHENSIVE METABOLIC PANEL
~~LOC~~ BKR ALBUMIN: 3.6 g/dL (ref 3.5–5.0)
~~LOC~~ BKR ALK PHOSPHATASE: 329 U/L — ABNORMAL HIGH (ref 25–110)
~~LOC~~ BKR ANION GAP: 11 10*3/uL (ref 3–12)
~~LOC~~ BKR BLD UREA NITROGEN: 36 mg/dL — ABNORMAL HIGH (ref 7–25)
~~LOC~~ BKR CALCIUM: 9 mg/dL — ABNORMAL HIGH (ref 8.5–10.6)
~~LOC~~ BKR CHLORIDE: 99 mmol/L — ABNORMAL HIGH (ref 98–110)
~~LOC~~ BKR CREATININE: 2.5 mg/dL — ABNORMAL HIGH (ref 0.40–1.00)
~~LOC~~ BKR GLOMERULAR FILTRATION RATE (GFR): 20 mL/min — ABNORMAL LOW (ref >60–0.80)
~~LOC~~ BKR GLUCOSE, RANDOM: 167 mg/dL — ABNORMAL HIGH (ref 70–100)
~~LOC~~ BKR POTASSIUM: 3.9 mmol/L — ABNORMAL LOW (ref 3.5–5.1)
~~LOC~~ BKR SODIUM, SERUM: 139 mmol/L — ABNORMAL LOW (ref 137–147)
~~LOC~~ BKR TOTAL BILIRUBIN: 0.5 mg/dL (ref 0.3–1.2)
~~LOC~~ BKR TOTAL PROTEIN: 8.2 g/dL — ABNORMAL HIGH (ref 6.0–8.0)

## 2023-01-31 LAB — CBC AND DIFF
~~LOC~~ BKR ABSOLUTE BASO COUNT: 0.1 10*3/uL (ref 0.00–0.20)
~~LOC~~ BKR ABSOLUTE EOS COUNT: 0.9 10*3/uL — ABNORMAL HIGH (ref 0.00–0.45)
~~LOC~~ BKR ABSOLUTE NEUTROPHIL: 6.3 10*3/uL (ref 1.80–7.00)
~~LOC~~ BKR BASOPHILS %: 1 % (ref 0–2)
~~LOC~~ BKR EOSINOPHILS %: 8 % — ABNORMAL HIGH (ref 0–5)
~~LOC~~ BKR RBC COUNT: 2.8 10*6/uL — ABNORMAL LOW (ref 4.00–5.00)
~~LOC~~ BKR WBC COUNT: 11 10*3/uL — ABNORMAL HIGH (ref 4.5–11.0)

## 2023-01-31 LAB — MAGNESIUM: ~~LOC~~ BKR MAGNESIUM: 1.4 mg/dL — ABNORMAL LOW (ref 1.6–2.6)

## 2023-01-31 MED ORDER — ATROPINE 0.4 MG/ML IJ SOLN
.4 mg | INTRAVENOUS | 0 refills | Status: AC | PRN
Start: 2023-01-31 — End: ?
  Administered 2023-01-31: 17:00:00 0.4 mg via INTRAVENOUS

## 2023-01-31 MED ORDER — PALONOSETRON 0.25 MG/5 ML IV SOLN
.25 mg | Freq: Once | INTRAVENOUS | 0 refills
Start: 2023-01-31 — End: ?

## 2023-01-31 MED ORDER — ATROPINE 0.4 MG/ML IJ SOLN
0.4 mg | INTRAVENOUS | 0 refills | PRN
Start: 2023-01-31 — End: ?

## 2023-01-31 MED ORDER — FLUOROURACIL IV AMB PUMP
2200 mg/m2 | Freq: Once | INTRAVENOUS | 0 refills | Status: CP
Start: 2023-01-31 — End: ?
  Administered 2023-01-31 (×3): 3212 mg via INTRAVENOUS

## 2023-01-31 MED ORDER — IRINOTECAN IVPB
110 mg/m2 | Freq: Once | INTRAVENOUS | 0 refills | Status: CP
Start: 2023-01-31 — End: ?
  Administered 2023-01-31 (×3): 160.6 mg via INTRAVENOUS

## 2023-01-31 MED ORDER — PALONOSETRON 0.25 MG/5 ML IV SOLN
.25 mg | Freq: Once | INTRAVENOUS | 0 refills | Status: CP
Start: 2023-01-31 — End: ?
  Administered 2023-01-31: 16:00:00 0.25 mg via INTRAVENOUS

## 2023-01-31 MED ORDER — MAGNESIUM SULFATE IN D5W 1 GRAM/100 ML IV PGBK (CC ONLY)
1 g | INTRAVENOUS | 0 refills | Status: CP
Start: 2023-01-31 — End: ?
  Administered 2023-01-31 (×2): 1 g via INTRAVENOUS

## 2023-01-31 MED ORDER — BEVACIZUMAB-BVZR IVPB
5 mg/kg | Freq: Once | INTRAVENOUS | 0 refills | Status: CP
Start: 2023-01-31 — End: ?
  Administered 2023-01-31 (×2): 248 mg via INTRAVENOUS

## 2023-01-31 MED ORDER — SODIUM CHLORIDE 0.9% IV SOLP
500 mL | Freq: Once | INTRAVENOUS | 0 refills | Status: CP
Start: 2023-01-31 — End: ?
  Administered 2023-01-31: 19:00:00 500 mL via INTRAVENOUS

## 2023-01-31 MED ORDER — APREPITANT 130 MG/18 ML (7.2 MG/ML) IV EMUL
130 mg | Freq: Once | INTRAVENOUS | 0 refills | Status: CP
Start: 2023-01-31 — End: ?
  Administered 2023-01-31: 16:00:00 130 mg via INTRAVENOUS

## 2023-01-31 NOTE — Progress Notes
Cancer Therapeutics Note  Verified consent signed and in chart.    Blood return positive via: Port (Single)    BSA and dose double checked (agree with orders as written) with: yes see emar    Labs/applicable tests checked: CBC and Comprehensive Metabolic Panel (CMP), BP    Treatment regimen: Drug/cycle/day A5W09 Folfiri/Zirabev    Rate verified and armband double check with second RN: yes    Patient education offered and stated understanding. Denies questions at this time.     Patient arrived to CC treatment D1C37 Folfiri/Zirabev after being seen in clinic with Lianne Moris, APRN; please refer to clinic note for assessment details. Ok to treat for bp 121/91 from Charlann Noss, APRN.  Premeds given per treatment plan. W1X91 Folfiri/Zirabev given w/o complications, patient tolerated well.  Port positive for blood return, flushed with saline, and currently infusing 5FU per protocol. Patient refused copy of labs and AVS. All questions and concerns addressed. Patient left CC treatment w/ spouse in stable condition.  Pt gets CADD unhook in [employer].

## 2023-02-02 ENCOUNTER — Encounter: Admit: 2023-02-02 | Discharge: 2023-02-02 | Payer: MEDICARE

## 2023-02-13 ENCOUNTER — Encounter: Admit: 2023-02-13 | Discharge: 2023-02-13 | Payer: MEDICARE

## 2023-02-14 ENCOUNTER — Encounter: Admit: 2023-02-14 | Discharge: 2023-02-14 | Payer: MEDICARE

## 2023-02-19 ENCOUNTER — Encounter: Admit: 2023-02-19 | Discharge: 2023-02-19 | Payer: MEDICARE

## 2023-02-20 ENCOUNTER — Encounter: Admit: 2023-02-20 | Discharge: 2023-02-20 | Payer: MEDICARE

## 2023-02-20 MED ORDER — POTASSIUM CHLORIDE 10 MEQ PO CPER
10 meq | ORAL_CAPSULE | Freq: Two times a day (BID) | ORAL | 3 refills | 30.00000 days | Status: AC
Start: 2023-02-20 — End: ?

## 2023-02-26 ENCOUNTER — Encounter: Admit: 2023-02-26 | Discharge: 2023-02-26 | Payer: MEDICARE

## 2023-02-27 ENCOUNTER — Encounter: Admit: 2023-02-27 | Discharge: 2023-02-27 | Payer: MEDICARE

## 2023-02-28 ENCOUNTER — Encounter: Admit: 2023-02-28 | Discharge: 2023-02-28 | Payer: MEDICARE

## 2023-02-28 DIAGNOSIS — C786 Secondary malignant neoplasm of retroperitoneum and peritoneum: Secondary | ICD-10-CM

## 2023-02-28 DIAGNOSIS — R112 Nausea with vomiting, unspecified: Secondary | ICD-10-CM

## 2023-02-28 DIAGNOSIS — C181 Malignant neoplasm of appendix: Secondary | ICD-10-CM

## 2023-02-28 LAB — COMPREHENSIVE METABOLIC PANEL
~~LOC~~ BKR ALBUMIN: 3.4 g/dL — ABNORMAL LOW (ref 3.5–5.0)
~~LOC~~ BKR ALK PHOSPHATASE: 343 U/L — ABNORMAL HIGH (ref 25–110)
~~LOC~~ BKR ALT: 32 U/L (ref 7–56)
~~LOC~~ BKR AST: 46 U/L — ABNORMAL HIGH (ref 7–40)
~~LOC~~ BKR BLD UREA NITROGEN: 29 mg/dL — ABNORMAL HIGH (ref 7–25)
~~LOC~~ BKR CALCIUM: 8 mg/dL — ABNORMAL LOW (ref 8.5–10.6)
~~LOC~~ BKR CO2: 29 mmol/L (ref 21–30)
~~LOC~~ BKR CREATININE: 2.9 mg/dL — ABNORMAL HIGH (ref 0.40–1.00)
~~LOC~~ BKR GLOMERULAR FILTRATION RATE (GFR): 17 mL/min — ABNORMAL LOW (ref >60–4.80)
~~LOC~~ BKR POTASSIUM: 3.8 mmol/L — ABNORMAL LOW (ref 3.5–5.1)
~~LOC~~ BKR TOTAL BILIRUBIN: 0.6 mg/dL (ref 0.2–1.3)
~~LOC~~ BKR TOTAL PROTEIN: 7.7 g/dL (ref 6.0–8.0)

## 2023-02-28 LAB — CA19.9

## 2023-02-28 LAB — CBC AND DIFF
~~LOC~~ BKR ABSOLUTE BASO COUNT: 0.1 10*3/uL (ref 0.00–0.20)
~~LOC~~ BKR ABSOLUTE EOS COUNT: 0.4 10*3/uL (ref 0.00–0.45)
~~LOC~~ BKR ABSOLUTE MONO COUNT: 1.7 10*3/uL — ABNORMAL HIGH (ref 0.00–0.80)
~~LOC~~ BKR HEMATOCRIT: 28 % — ABNORMAL LOW (ref 36.0–45.0)
~~LOC~~ BKR MCV: 100 fL — ABNORMAL HIGH (ref 80.0–100.0)
~~LOC~~ BKR RBC COUNT: 2.7 10*6/uL — ABNORMAL LOW (ref 4.00–5.00)
~~LOC~~ BKR WBC COUNT: 11 10*3/uL — ABNORMAL HIGH (ref 4.50–11.00)

## 2023-02-28 LAB — MAGNESIUM: ~~LOC~~ BKR MAGNESIUM: 1 mg/dL — ABNORMAL LOW (ref 1.6–2.6)

## 2023-02-28 LAB — CEA(CARCINOEMBRYONIC AG): ~~LOC~~ BKR CEA: 170 ng/mL — ABNORMAL HIGH (ref <3.0)

## 2023-02-28 MED ORDER — APREPITANT 130 MG/18 ML (7.2 MG/ML) IV EMUL
130 mg | Freq: Once | INTRAVENOUS | 0 refills | Status: CP
Start: 2023-02-28 — End: ?
  Administered 2023-02-28: 17:00:00 130 mg via INTRAVENOUS

## 2023-02-28 MED ORDER — FLUOROURACIL IV AMB PUMP
2200 mg/m2 | Freq: Once | INTRAVENOUS | 0 refills | Status: CP
Start: 2023-02-28 — End: ?
  Administered 2023-02-28 (×2): 3212 mg via INTRAVENOUS

## 2023-02-28 MED ORDER — ATROPINE 0.4 MG/ML IJ SOLN
.4 mg | INTRAVENOUS | 0 refills | Status: AC | PRN
Start: 2023-02-28 — End: ?
  Administered 2023-02-28: 18:00:00 0.4 mg via INTRAVENOUS

## 2023-02-28 MED ORDER — BEVACIZUMAB-BVZR IVPB
5 mg/kg | Freq: Once | INTRAVENOUS | 0 refills | Status: CP
Start: 2023-02-28 — End: ?
  Administered 2023-02-28 (×2): 248 mg via INTRAVENOUS

## 2023-02-28 MED ORDER — PALONOSETRON 0.25 MG/5 ML IV SOLN
.25 mg | Freq: Once | INTRAVENOUS | 0 refills | Status: CP
Start: 2023-02-28 — End: ?
  Administered 2023-02-28: 17:00:00 0.25 mg via INTRAVENOUS

## 2023-02-28 MED ORDER — IRINOTECAN IVPB
110 mg/m2 | Freq: Once | INTRAVENOUS | 0 refills | Status: CP
Start: 2023-02-28 — End: ?
  Administered 2023-02-28 (×3): 160.6 mg via INTRAVENOUS

## 2023-02-28 MED ORDER — SODIUM CHLORIDE 0.9% IV SOLP
500 mL | Freq: Once | INTRAVENOUS | 0 refills | Status: CP
Start: 2023-02-28 — End: ?
  Administered 2023-02-28: 17:00:00 500 mL via INTRAVENOUS

## 2023-02-28 MED ORDER — MAGNESIUM SULFATE IN D5W 1 GRAM/100 ML IV PGBK (CC ONLY)
1 g | INTRAVENOUS | 0 refills | Status: CP
Start: 2023-02-28 — End: ?
  Administered 2023-02-28 (×2): 1 g via INTRAVENOUS

## 2023-02-28 NOTE — Progress Notes
Cancer Therapeutics Note  Verified consent signed and in chart.    Blood return positive via: Port (Single)    BSA and dose double checked (agree with orders as written) with: yes See MAR    Labs/applicable tests checked: CBC and Comprehensive Metabolic Panel (CMP)    Treatment regimen:   bevacizumab-bvzr (ZIRABEV) 248 mg in sodium chloride 0.9% (NS) 109.92 mL (*) IVPB      irinotecan (CAMPTOSAR) 160.6 mg in dextrose 5% (D5W) 508.03 mL IVPB    fluorouraciL (ADRUCIL) 3,212 mg in sodium chloride 0.9% (NS) 92 mL IV amb pump      Rate verified and armband double check with second RN: yes    Patient education offered and stated understanding. Denies questions at this time.    Patient arrived to CC treatment for D1C38 zirabev/irinotecan/fluorouracil. Patient evaluated for treatment by Dr. Josiah Lobo. Patient completed infusions with no known adverse events. Port flushed with positive blood return. Cadd pump connected to patient by D[employer] fundraiser. Patient denies any other needs. Patient left ambulatory.

## 2023-02-28 NOTE — Progress Notes
Name: Vicki Buchanan          MRN: [id number]      DOB: 1955/12/05      AGE: 67 y.o.   DATE OF SERVICE: 02/28/2023    Subjective:             Reason for Visit:  Follow Up      Vicki Buchanan is a 67 y.o. female.       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      She was seen by her PCP on 02/14/2017 with intermittent but recurrent abdominal cramping, bloating and diarrhea. Patient reports 6-7 months ago she began having episodes of abdominal cramping that progressed in severity until it caused her to vomit multiple times until it was dry heaving. This was followed by diarrhea the next morning. Patient continued to have abdominal cramping and bloating with alternating watery diarrhea and constipation.  Upon examination she was noted decreased bowel sounds, mass (multiple firm lesions noted on deep palpation to RLQ, mid upper quadrant, and LLQ, non tender to palpation). She is tender with firm mass noted RUQ, LUQ and LLQ. A CT scan was done on 02/14/2017 that demonstrated large volume of abdominopelvic ascites, omental caking, evidence of pelvic peritoneal  enhancing nodularity suspicious for peritoneal carcinomatosis, and hepatic tumor implantation. Findings are most suspicious  for underling ovarian or gastrointestinal neoplasm.     She was then seen by a General Surgeon and on 02/19/2017 she underwent diagnostic laparoscopy with biopsies of right diaphragm, right liver, right fallopian tube and left pelvis. Pathology showed involvement by mucinous neoplasm with low-grade features in all four biopsy specimens. CA 125 on 02/27/2017 was 90.4 and CEA is 1621.0.    Findings at the time of surgery on 02/19/17 by Dr. Jonah Blue: A total of 1.7 L of fluid was removed and the peritoneal cavity.  It was slightly turbid and hemorrhagic in color.  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  There was bulky disease in the pelvis, scattered non-bulky disease of the right hemidiaphragm and on the capsule liver.  Left hemidiaphragm appear to be spared.  On the parietal peritoneum it was started with areas suspicious for carcinomatosis.  The stomach was observed.  There were no evident masses that could be discerned externally.  There was bulky disease in the pelvis over the bladder and a large, what appeared to be, right tubo-ovarian mass with heavy disposition of tumor in that area.  She had also an area of the right fimbria of the fallopian tube with a large to positive tumor.  A portion of the right fallopian tube and fimbria were removed with ligature device.  Biopsies left pelvis were taken of the left pelvic sidewall.  Biopsies were also taken of the serosal surface of the liver and the right hemidiaphragm.     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 This was low grade mucinous neoplasm which was focally positive for CK7, and negative for PAX-8. Cytology from abdominal fluid was negative for malignancy.     She was taken to the OR on 04/03/2017 for potential cytoreductive surgery and hyperthermic intraperitoneal chemotherapy (CRS and HIPEC). 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  The process involved the entire mesentery of the small and large bowel as well as the root of the mesentery.  Almost all of the peritoneal surfaces had disease.  Unfortunately, the mesenteric disease was infiltrative to an extent where it would have required too much of bowel resection in order to remove the disease. A diverting loop ileostomy was performed to help alleviate obstructive symptoms. Pathology described mucinous carcinoma with low-grade morphology and occasional high-grade areas (G2 and focal G3 with focal signet ring cells).    She received FOLFOX + Avastin since February-September 2019 under the direction of Dr. Josiah Lobo. She began a clinical trial in Sept 2019 with SIROLIMUS + EPACADOSTAT.    She stopped the study drug on 05/21/18 as she chose to enroll in the Mitomycin HIPEC trial at [facility] and completed her first laparoscopic HIPEC on 05/28/18 and second laparoscopic HIPEC on 06/25/18.     She continued to have significant pelvic discomfort and urinary frequency due to the large pelvic mass. She sought an opinion from Dr. [name] in [place], [place]. On 09/12/2018, she underwent an exp Lap, cytoreductive debulking surgery for metastatic mucinous adenocarcinoma of the appendix, total colectomy, splenectomy, omentectomy, TAH/BSO and HIPEC with Mitomycin-C. She underwent a CC-3 cytoreduction with significant amount of visible and palpable disease remaining.    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    Had debulking of the peristomal tumor in July 2022 by Dr. [name].    Started back on maintenance 5-FU, irinotecan and bevacizumab on 12/14/2020.        Vicki Buchanan presents today for surgical surveillance. She continues to receive chemotherapy with Dr. Josiah Lobo and recent CT shows stable disease.    She has a retracted stoma that has been difficult to pouch. Currently bags stay on about 1-2 days.    Recent labs and imaging reviewed.       Review of Systems   Constitutional:  Positive for fatigue. Negative for activity change, appetite change, chills, diaphoresis, fever and unexpected weight change.   HENT:  Negative for facial swelling, mouth sores, sore throat, trouble swallowing and voice change.    Eyes: Negative.  Negative for visual disturbance.   Respiratory:  Negative for cough, chest tightness, shortness of breath and wheezing.    Cardiovascular: Negative.  Negative for chest pain, palpitations and leg swelling.   Gastrointestinal:  Positive for diarrhea. Negative for abdominal distention, abdominal pain, anal bleeding, blood in stool, constipation, nausea, rectal pain and vomiting.   Genitourinary:  Negative for difficulty urinating, dysuria and hematuria.   Musculoskeletal: Negative.  Negative for arthralgias, back pain, neck pain and neck stiffness.   Skin:  Negative for color change, pallor, rash and wound.   Neurological:  Positive for weakness. Negative for dizziness, light-headedness, numbness and headaches.   Hematological:  Does not bruise/bleed easily.   Psychiatric/Behavioral:  Negative for agitation. The patient is not nervous/anxious.          Objective:          apap-maalox-lidocaine 1:3:1 oral suspension (MIXTURE COMPOUND) Take 30 mL by mouth every 4 hours as needed.    calcium carbonate (TUMS) 500 mg (200 mg elemental calcium) chewable tablet Chew one tablet by mouth every 6 hours as needed.    carvediloL (COREG) 6.25 mg tablet Take one tablet by mouth twice daily. Take with food.  Hold if systolic blood pressure < 110    Cetirizine (ZYRTEC) 10 mg cap Take one capsule by mouth as Needed.    ciprofloxacin (CIPRO) 250 mg tablet Take one tablet  by mouth twice daily. Pt states she is taking this to prevent UTI, she takes 1 tablet every 3rd day    diphenoxylate-atropine (LOMOTIL) 2.5-0.025 mg tablet Take one tablet by mouth four times daily as needed for Diarrhea.    esomeprazole DR (NEXIUM) 20 mg capsule Take one capsule by mouth daily. Take on an empty stomach at least 1 hour before or 2 hours after food.    loperamide (IMODIUM A-D) 2 mg capsule Take one pill by mouth four times daily as needed    magnesium oxide 400 mg magnesium tablet Take one tablet by mouth twice daily.    ondansetron (ZOFRAN ODT) 8 mg rapid dissolve tablet Dissolve one tablet by mouth every 8 hours as needed for Nausea or Vomiting. Place on tongue to disolve.    potassium chloride (KLOR-CON SPRINKLE) 10 mEq capsule Take one capsule by mouth twice daily. If potassium < 3.5. Take with a meal and a full glass of water.    sod chlor,sod bicarb/neti pot (SINUS WASH NETI POT SINI) Use as directed    sodium chloride 0.9% (NS) 0.9 % 1,000 mL with potassium chloride 2 mEq/mL 20 mEq, magnesium sulfate 4 mEq/mL (50 %) 2 g IV infusion Administer 1L NS with K and 2g Mg once weekly via outpatient infusion.    traMADoL (ULTRAM) 50 mg tablet Take one tablet to two tablets by mouth daily as needed for Pain.     Vitals:    02/28/23 0932   BP: 132/80   BP Source: Arm, Right Upper   Pulse: 76   Temp: 36.2 ?C (97.2 ?F)   Resp: 16   SpO2: 99%   TempSrc: Temporal   Weight: 49.9 kg (110 lb)     Body mass index is 20.12 kg/m?Marland Kitchen                  Pain Addressed:  N/A    Patient Evaluated for a Clinical Trial: No treatment clinical trial available for this patient.     [place] Cooperative Oncology Group performance status is 1, Restricted in physically strenuous activity but ambulatory and able to carry out work of a light or sedentary nature, e.g., light house work, office work.     Physical Exam  Vitals reviewed.   Constitutional:       Appearance: She is well-developed.   HENT:      Head: Normocephalic and atraumatic.   Musculoskeletal:         General: Normal range of motion.   Skin:     General: Skin is warm and dry.   Neurological:      Mental Status: She is alert and oriented to person, place, and time.   Psychiatric:         Behavior: Behavior normal.         Thought Content: Thought content normal.         Judgment: Judgment normal.               Assessment and Plan:  Peritoneal carcinomatosis  - Interval history, imaging and labs reviewed.  - Refer to the ostomy nurse clinic.  - Medical oncology with Dr. Josiah Lobo  - RTC 6 months for sooner if concerns arise.    Lilian Kapur, PA-C    Collaborating physician: Loralie Champagne, MD

## 2023-03-09 ENCOUNTER — Encounter: Admit: 2023-03-09 | Discharge: 2023-03-09 | Payer: MEDICARE

## 2023-03-09 ENCOUNTER — Ambulatory Visit: Admit: 2023-03-09 | Discharge: 2023-03-10 | Payer: MEDICARE

## 2023-03-19 ENCOUNTER — Encounter: Admit: 2023-03-19 | Discharge: 2023-03-19 | Payer: MEDICARE

## 2023-03-21 ENCOUNTER — Encounter: Admit: 2023-03-21 | Discharge: 2023-03-21 | Payer: MEDICARE

## 2023-03-25 ENCOUNTER — Encounter: Admit: 2023-03-25 | Discharge: 2023-03-25 | Payer: MEDICARE

## 2023-03-28 ENCOUNTER — Encounter: Admit: 2023-03-28 | Discharge: 2023-03-28 | Payer: MEDICARE

## 2023-03-28 DIAGNOSIS — R112 Nausea with vomiting, unspecified: Secondary | ICD-10-CM

## 2023-03-28 DIAGNOSIS — C786 Secondary malignant neoplasm of retroperitoneum and peritoneum: Secondary | ICD-10-CM

## 2023-03-28 LAB — COMPREHENSIVE METABOLIC PANEL
~~LOC~~ BKR ALBUMIN: 3.7 g/dL (ref 3.5–5.0)
~~LOC~~ BKR ALK PHOSPHATASE: 321 U/L — ABNORMAL HIGH (ref 25–110)
~~LOC~~ BKR ALT: 36 U/L (ref 7–56)
~~LOC~~ BKR ANION GAP: 11 10*3/uL — ABNORMAL HIGH (ref 3–12)
~~LOC~~ BKR AST: 55 U/L — ABNORMAL HIGH (ref 7–40)
~~LOC~~ BKR BLD UREA NITROGEN: 29 mg/dL — ABNORMAL HIGH (ref 7–25)
~~LOC~~ BKR CALCIUM: 9.2 mg/dL — ABNORMAL HIGH (ref 8.5–10.6)
~~LOC~~ BKR CHLORIDE: 96 mmol/L — ABNORMAL LOW (ref 98–110)
~~LOC~~ BKR CO2: 31 mmol/L — ABNORMAL HIGH (ref 21–30)
~~LOC~~ BKR CREATININE: 2.8 mg/dL — ABNORMAL HIGH (ref 0.40–1.00)
~~LOC~~ BKR GLOMERULAR FILTRATION RATE (GFR): 17 mL/min — ABNORMAL LOW (ref >60–4.80)
~~LOC~~ BKR POTASSIUM: 3.7 mmol/L — ABNORMAL LOW (ref 3.5–5.1)
~~LOC~~ BKR TOTAL BILIRUBIN: 0.5 mg/dL (ref 0.2–1.3)
~~LOC~~ BKR TOTAL PROTEIN: 9 g/dL — ABNORMAL HIGH (ref 6.0–8.0)

## 2023-03-28 LAB — CBC AND DIFF
~~LOC~~ BKR ABSOLUTE BASO COUNT: 0.1 10*3/uL (ref 0.00–0.20)
~~LOC~~ BKR ABSOLUTE EOS COUNT: 0.2 10*3/uL (ref 0.00–0.45)
~~LOC~~ BKR ABSOLUTE MONO COUNT: 2.1 10*3/uL — ABNORMAL HIGH (ref 0.00–0.80)
~~LOC~~ BKR MCV: 99 fL — ABNORMAL HIGH (ref 80.0–100.0)
~~LOC~~ BKR RBC COUNT: 2.9 10*6/uL — ABNORMAL LOW (ref 4.00–5.00)
~~LOC~~ BKR WBC COUNT: 14 10*3/uL — ABNORMAL HIGH (ref 4.50–11.00)

## 2023-03-28 LAB — CA19.9

## 2023-03-28 LAB — CEA(CARCINOEMBRYONIC AG): ~~LOC~~ BKR CEA: 161 ng/mL — ABNORMAL HIGH (ref <3.0)

## 2023-03-28 LAB — MAGNESIUM: ~~LOC~~ BKR MAGNESIUM: 1.3 mg/dL — ABNORMAL LOW (ref 1.6–2.6)

## 2023-03-28 MED ORDER — PALONOSETRON 0.25 MG/5 ML IV SOLN
.25 mg | Freq: Once | INTRAVENOUS | 0 refills | Status: CP
Start: 2023-03-28 — End: ?
  Administered 2023-03-28: 15:00:00 0.25 mg via INTRAVENOUS

## 2023-03-28 MED ORDER — IRINOTECAN IVPB
110 mg/m2 | Freq: Once | INTRAVENOUS | 0 refills | Status: CP
Start: 2023-03-28 — End: ?
  Administered 2023-03-28 (×3): 160.6 mg via INTRAVENOUS

## 2023-03-28 MED ORDER — FLUOROURACIL IV AMB PUMP
2200 mg/m2 | Freq: Once | INTRAVENOUS | 0 refills | Status: CP
Start: 2023-03-28 — End: ?
  Administered 2023-03-28 (×3): 3212 mg via INTRAVENOUS

## 2023-03-28 MED ORDER — BEVACIZUMAB-BVZR IVPB
5 mg/kg | Freq: Once | INTRAVENOUS | 0 refills | Status: CP
Start: 2023-03-28 — End: ?
  Administered 2023-03-28 (×2): 248 mg via INTRAVENOUS

## 2023-03-28 MED ORDER — ATROPINE 0.4 MG/ML IJ SOLN
.4 mg | INTRAVENOUS | 0 refills | Status: AC | PRN
Start: 2023-03-28 — End: ?
  Administered 2023-03-28: 16:00:00 0.4 mg via INTRAVENOUS

## 2023-03-28 MED ORDER — MAGNESIUM SULFATE IN D5W 1 GRAM/100 ML IV PGBK (CC ONLY)
1 g | INTRAVENOUS | 0 refills | Status: CP
Start: 2023-03-28 — End: ?
  Administered 2023-03-28 (×2): 1 g via INTRAVENOUS

## 2023-03-28 MED ORDER — SODIUM CHLORIDE 0.9% IV SOLP
1000 mL | Freq: Once | INTRAVENOUS | 0 refills | Status: CP
Start: 2023-03-28 — End: ?
  Administered 2023-03-28: 15:00:00 1000 mL via INTRAVENOUS

## 2023-03-28 MED ORDER — APREPITANT 130 MG/18 ML (7.2 MG/ML) IV EMUL
130 mg | Freq: Once | INTRAVENOUS | 0 refills | Status: CP
Start: 2023-03-28 — End: ?
  Administered 2023-03-28: 15:00:00 130 mg via INTRAVENOUS

## 2023-03-28 NOTE — Progress Notes
Patient arrived to CC treatment after being seen in clinic with Dr. Josiah Lobo; please refer to clinic note for assessment details. Premeds given per treatment plan. C39 MVASI, Folfiri, and magnesium replacement given w/o complications, patient tolerated well.  5FU infusing via ambulatory pump, all line secured, and clamps open. All questions and concerns addressed. Patient left CC treatment w/ all personal belongings in stable condition.    Cancer Therapeutics Note  Verified consent signed and in chart.    Blood return positive via: Port (Power Port)    BSA and dose double checked (agree with orders as written) with: yes, see MAR    Labs/applicable tests checked: CBC and Comprehensive Metabolic Panel (CMP)    Treatment regimen: Drug/cycle/day C39 MVASI/Folfiri  bevacizumab-bvzr (ZIRABEV) 248 mg in sodium chloride 0.9% 109.92 mL (*) IVPB     irinotecan (CAMPTOSAR) 160.6 mg in dextrose 5% (D5) 508.03 mL IVPB     fluorouraciL (ADRUCIL) 3,212 mg in sodium chloride 0.9% 92 mL IV amb pump     Rate verified and armband double check with second RN: yes, see MAR    Patient education offered and stated understanding. Denies questions at this time.

## 2023-04-16 ENCOUNTER — Encounter: Admit: 2023-04-16 | Discharge: 2023-04-16 | Payer: MEDICARE

## 2023-04-17 ENCOUNTER — Encounter: Admit: 2023-04-17 | Discharge: 2023-04-17 | Payer: MEDICARE

## 2023-04-22 ENCOUNTER — Encounter: Admit: 2023-04-22 | Discharge: 2023-04-22 | Payer: MEDICARE

## 2023-04-26 ENCOUNTER — Encounter: Admit: 2023-04-26 | Discharge: 2023-04-26 | Payer: MEDICARE

## 2023-04-26 ENCOUNTER — Ambulatory Visit: Admit: 2023-04-26 | Discharge: 2023-04-26 | Payer: MEDICARE

## 2023-04-26 ENCOUNTER — Ambulatory Visit: Admit: 2023-04-26 | Discharge: 2023-04-27 | Payer: MEDICARE

## 2023-04-26 MED ORDER — PHENYLEPHRINE HCL IN 0.9% NACL 1 MG/10 ML (100 MCG/ML) IV SYRG
INTRAVENOUS | 0 refills | Status: DC
Start: 2023-04-26 — End: 2023-04-26

## 2023-04-26 MED ORDER — SUCCINYLCHOLINE CHLORIDE 20 MG/ML IJ SOLN
INTRAVENOUS | 0 refills | Status: DC
Start: 2023-04-26 — End: 2023-04-26

## 2023-04-26 MED ORDER — FENTANYL CITRATE (PF) 50 MCG/ML IJ SOLN
INTRAVENOUS | 0 refills | Status: DC
Start: 2023-04-26 — End: 2023-04-26

## 2023-04-26 MED ORDER — PROPOFOL INJ 10 MG/ML IV VIAL
INTRAVENOUS | 0 refills | Status: DC
Start: 2023-04-26 — End: 2023-04-26

## 2023-04-26 MED ORDER — LIDOCAINE (PF) 200 MG/10 ML (2 %) IJ SYRG
INTRAVENOUS | 0 refills | Status: DC
Start: 2023-04-26 — End: 2023-04-26

## 2023-04-26 MED ORDER — ONDANSETRON HCL (PF) 4 MG/2 ML IJ SOLN
INTRAVENOUS | 0 refills | Status: DC
Start: 2023-04-26 — End: 2023-04-26

## 2023-04-26 NOTE — Anesthesia Procedure Notes
Procedure: Airway Placement    AIRWAY INSERTION    Date/Time: 04/26/2023 7:51 AM    Patient location: OR  Urgency: elective  Difficult Airway: No            Airway Procedure  Indication(s) for airway management: surgery        no    Preoxygenated: yes      Mask difficulty assessment: 0 - not attempted      Procedure Outcome  Final airway type: endotracheal airway  Endotracheal airway: ETT          ETT size (mm): 7.0  Technique used for successful ETT placement: direct laryngoscopy    Insertion site: oral  Blade type: Macintosh   Laryngoscope/Videolaryngoscope blade size: 3  Cormack-Lehane classification: grade I - full view of glottis      Measured from: gums   Depth: 21 cm  Amount of Air in Cuff: 10 ml  Number of attempts at approach: 1  Placement verified by auscultation and capnometry                Performed by: Verdie Shire, MD  Authorized by: Verdie Shire, MD

## 2023-04-26 NOTE — Anesthesia Post-Procedure Evaluation
Post-Anesthesia Evaluation    Name: Vicki Buchanan      MRN: [id number]     DOB: 09-Apr-1955     Age: 68 y.o.     Sex: female   __________________________________________________________________________     Procedure Information       Anesthesia Start Date/Time: 04/26/23 0745    Procedure: ENDOSCOPIC RETROGRADE CHOLANGIOPANCREATOGRAPHY WITH REMOVAL AND EXCHANGE OF STENT BILIARY/ PANCREATIC DUCT - EACH STENT EXCHANGED    Location: ENDO 2 / ENDO/GI    Surgeons: Eliott Nine, MD            Post-Anesthesia Vitals  BP: 131/68 (02/06 0850)  Temp: 36.8 ?C (98.2 ?F) (02/06 0830)  Pulse: 75 (02/06 0850)  Respirations: 12 PER MINUTE (02/06 0850)  SpO2: 99 % (02/06 0850)  O2 Device: None (Room air) (02/06 0850)   Vitals Value Taken Time   BP 131/68 04/26/23 0850   Temp 36.8 ?C (98.2 ?F) 04/26/23 0830   Pulse 75 04/26/23 0850   Respirations 12 PER MINUTE 04/26/23 0850   SpO2 99 % 04/26/23 0850   O2 Device None (Room air) 04/26/23 0850   ABP     ART BP           Post Anesthesia Evaluation Note    Evaluation location: Pre/Post  Patient participation: recovered; patient participated in evaluation  Level of consciousness: alert  Pain management: adequate    Hydration: normovolemia  Temperature: 36.0?C - 38.4?C  Airway patency: adequate    Perioperative Events       Post-op nausea and vomiting: no PONV    Postoperative Status  Cardiovascular status: hemodynamically stable  Respiratory status: spontaneous ventilation  Follow-up needed: none        Perioperative Events  There were no known complications for this encounter.

## 2023-04-27 ENCOUNTER — Ambulatory Visit: Admit: 2023-04-27 | Discharge: 2023-04-27 | Payer: MEDICARE

## 2023-04-28 ENCOUNTER — Encounter: Admit: 2023-04-28 | Discharge: 2023-04-28 | Payer: MEDICARE

## 2023-04-30 ENCOUNTER — Encounter: Admit: 2023-04-30 | Discharge: 2023-04-30 | Payer: MEDICARE

## 2023-05-07 ENCOUNTER — Encounter: Admit: 2023-05-07 | Discharge: 2023-05-07 | Payer: MEDICARE

## 2023-05-08 ENCOUNTER — Encounter: Admit: 2023-05-08 | Discharge: 2023-05-08 | Payer: MEDICARE

## 2023-05-08 DIAGNOSIS — C786 Secondary malignant neoplasm of retroperitoneum and peritoneum: Secondary | ICD-10-CM

## 2023-05-08 DIAGNOSIS — C181 Malignant neoplasm of appendix: Secondary | ICD-10-CM

## 2023-05-08 LAB — CBC AND DIFF

## 2023-05-08 LAB — COMPREHENSIVE METABOLIC PANEL

## 2023-05-08 NOTE — Telephone Encounter
 Barb from pt's pcp office called asking if we have received pt's lab results and to see if Dr Josiah Lobo has any recommendations. This RN uploaded lab results and showed Dr Josiah Lobo. Per Dr Josiah Lobo Hb 7.1 so 1 unit pRBC would be good. From kidney function and electrolytes standpoint would defer it to nephrology    Called Barb back to let her know Dr Stephens November instructions. LVM. They are to call our office with any questions or concerns.

## 2023-05-09 ENCOUNTER — Encounter: Admit: 2023-05-09 | Discharge: 2023-05-09 | Payer: MEDICARE

## 2023-05-16 ENCOUNTER — Encounter: Admit: 2023-05-16 | Discharge: 2023-05-16 | Payer: MEDICARE

## 2023-05-21 ENCOUNTER — Encounter: Admit: 2023-05-21 | Discharge: 2023-05-21 | Payer: MEDICARE

## 2023-05-21 ENCOUNTER — Ambulatory Visit: Admit: 2023-05-21 | Discharge: 2023-05-21 | Payer: MEDICARE

## 2023-05-23 ENCOUNTER — Encounter: Admit: 2023-05-23 | Discharge: 2023-05-23 | Payer: MEDICARE

## 2023-05-24 ENCOUNTER — Encounter: Admit: 2023-05-24 | Discharge: 2023-05-24 | Payer: MEDICARE

## 2023-05-25 ENCOUNTER — Encounter: Admit: 2023-05-25 | Discharge: 2023-05-25 | Payer: MEDICARE

## 2023-05-25 MED ORDER — PALONOSETRON 0.25 MG/5 ML IV SOLN
.25 mg | Freq: Once | INTRAVENOUS | 0 refills
Start: 2023-05-25 — End: ?

## 2023-05-25 MED ORDER — ATROPINE 0.4 MG/ML IJ SOLN
0.4 mg | INTRAVENOUS | 0 refills | PRN
Start: 2023-05-25 — End: ?

## 2023-05-27 ENCOUNTER — Encounter: Admit: 2023-05-27 | Discharge: 2023-05-27 | Payer: MEDICARE

## 2023-05-29 ENCOUNTER — Encounter: Admit: 2023-05-29 | Discharge: 2023-05-29 | Payer: MEDICARE

## 2023-05-30 ENCOUNTER — Encounter: Admit: 2023-05-30 | Discharge: 2023-05-30 | Payer: MEDICARE

## 2023-05-30 DIAGNOSIS — C786 Secondary malignant neoplasm of retroperitoneum and peritoneum: Secondary | ICD-10-CM

## 2023-05-30 DIAGNOSIS — N184 Chronic kidney disease, stage 4 (severe): Secondary | ICD-10-CM

## 2023-05-30 DIAGNOSIS — R112 Nausea with vomiting, unspecified: Secondary | ICD-10-CM

## 2023-05-30 LAB — COMPREHENSIVE METABOLIC PANEL
~~LOC~~ BKR ALBUMIN: 3.4 g/dL — ABNORMAL LOW (ref 3.5–5.0)
~~LOC~~ BKR ALT: 33 U/L (ref 7–56)
~~LOC~~ BKR ANION GAP: 8 10*3/uL (ref 3–12)
~~LOC~~ BKR AST: 52 U/L — ABNORMAL HIGH (ref 7–40)
~~LOC~~ BKR BLD UREA NITROGEN: 30 mg/dL — ABNORMAL HIGH (ref 7–25)
~~LOC~~ BKR CALCIUM: 8.7 mg/dL (ref 8.5–10.6)
~~LOC~~ BKR CHLORIDE: 101 mmol/L — ABNORMAL LOW (ref 98–110)
~~LOC~~ BKR CO2: 29 mmol/L (ref 21–30)
~~LOC~~ BKR CREATININE: 3.1 mg/dL — ABNORMAL HIGH (ref 0.40–1.00)
~~LOC~~ BKR GLOMERULAR FILTRATION RATE (GFR): 15 mL/min — ABNORMAL LOW (ref >60–4.80)
~~LOC~~ BKR POTASSIUM: 3.5 mmol/L — ABNORMAL LOW (ref 3.5–5.1)
~~LOC~~ BKR TOTAL BILIRUBIN: 0.5 mg/dL (ref 0.2–1.3)

## 2023-05-30 LAB — PROTEIN/CR RATIO,UR RAN
~~LOC~~ BKR PROT CREAT RAT/CAL: 0.5 — ABNORMAL HIGH (ref <0.15)
~~LOC~~ BKR UR CREATININE, RAN: 165 mg/dL
~~LOC~~ BKR UR TOTAL PROTEIN,RAN: 82 mg/dL

## 2023-05-30 LAB — CBC AND DIFF
~~LOC~~ BKR ABSOLUTE BASO COUNT: 0.1 10*3/uL (ref 0.00–0.20)
~~LOC~~ BKR ABSOLUTE EOS COUNT: 0.4 10*3/uL (ref 0.00–0.45)
~~LOC~~ BKR ABSOLUTE MONO COUNT: 1.1 10*3/uL — ABNORMAL HIGH (ref 0.00–0.80)
~~LOC~~ BKR LYMPHOCYTES %: 27 % — ABNORMAL HIGH (ref 24.0–44.0)
~~LOC~~ BKR MCV: 99 fL — ABNORMAL HIGH (ref 80.0–100.0)
~~LOC~~ BKR PLATELET COUNT: 439 10*3/uL — ABNORMAL HIGH (ref 150–400)
~~LOC~~ BKR RBC COUNT: 2.3 10*6/uL — ABNORMAL LOW (ref 4.00–5.00)
~~LOC~~ BKR WBC COUNT: 9.4 10*3/uL (ref 4.50–11.00)

## 2023-05-30 LAB — CA19.9

## 2023-05-30 LAB — CEA(CARCINOEMBRYONIC AG): ~~LOC~~ BKR CEA: 185 ng/mL — ABNORMAL HIGH (ref <3.0)

## 2023-05-30 LAB — MAGNESIUM: ~~LOC~~ BKR MAGNESIUM: 1.3 mg/dL — ABNORMAL LOW (ref 1.6–2.6)

## 2023-05-30 MED ORDER — ATROPINE 0.4 MG/ML IJ SOLN
.4 mg | INTRAVENOUS | 0 refills | Status: AC | PRN
Start: 2023-05-30 — End: ?
  Administered 2023-05-30: 17:00:00 0.4 mg via INTRAVENOUS

## 2023-05-30 MED ORDER — MAGNESIUM SULFATE IN D5W 1 GRAM/100 ML IV PGBK (CC ONLY)
1 g | INTRAVENOUS | 0 refills | Status: CP
Start: 2023-05-30 — End: ?
  Administered 2023-05-30 (×2): 1 g via INTRAVENOUS

## 2023-05-30 MED ORDER — PALONOSETRON 0.25 MG/5 ML IV SOLN
.25 mg | Freq: Once | INTRAVENOUS | 0 refills | Status: CP
Start: 2023-05-30 — End: ?
  Administered 2023-05-30: 15:00:00 0.25 mg via INTRAVENOUS

## 2023-05-30 MED ORDER — FLUOROURACIL IV AMB PUMP
2200 mg/m2 | Freq: Once | INTRAVENOUS | 0 refills | Status: CP
Start: 2023-05-30 — End: ?
  Administered 2023-05-30 (×3): 3212 mg via INTRAVENOUS

## 2023-05-30 MED ORDER — IRINOTECAN IVPB
110 mg/m2 | Freq: Once | INTRAVENOUS | 0 refills | Status: CP
Start: 2023-05-30 — End: ?
  Administered 2023-05-30 (×2): 160.6 mg via INTRAVENOUS

## 2023-05-30 MED ORDER — SODIUM CHLORIDE 0.9% IV SOLP
1000 mL | Freq: Once | INTRAVENOUS | 0 refills | Status: CP
Start: 2023-05-30 — End: ?
  Administered 2023-05-30: 15:00:00 1000 mL via INTRAVENOUS

## 2023-05-30 MED ORDER — APREPITANT 130 MG/18 ML (7.2 MG/ML) IV EMUL
130 mg | Freq: Once | INTRAVENOUS | 0 refills | Status: CP
Start: 2023-05-30 — End: ?
  Administered 2023-05-30: 15:00:00 130 mg via INTRAVENOUS

## 2023-05-30 NOTE — Patient Instructions
 Call Immediately to report the following:  Unexplained bleeding or bleeding that will not stop  Difficulty swallowing  Shortness of breath, wheezing, or trouble breathing  Rapid, irregular heartbeat; chest pain  Dizziness, lightheadedness  Rash or cut that swells or turns red, feels hot or painful, or begin to ooze  Diarrhea   Uncontrolled nausea or vomiting  Fever of 100.4 F or higher, or chills    Important Phone Numbers:  Cancer Center Main Number (answered 24 hours a day) [phone]  Cancer Center Scheduling (appointments) [phone]  Social Worker [phone]

## 2023-05-30 NOTE — Progress Notes
 Patient to follow up with Dr. Josiah Lobo, see clinic note for assessment details.  Port flushed, brisk blood return noted.   OK to treat, labs OK to treat.  Tolerated infusion well.   Port flushed & left per protocol.  Discharged in stable condition.    Cancer Therapeutics Note  Verified consent signed and in chart.    Blood return positive via: Port (Single)    BSA and dose double checked (agree with orders as written) with: yes     Labs/applicable tests checked: CBC and Comprehensive Metabolic Panel (CMP)    Treatment regimen: Drug/cycle/day C39   irinotecan (CAMPTOSAR) 160.6 mg in dextrose 5% (D5) 508.03 mL IVPB         fluorouraciL (ADRUCIL) 3,212 mg in sodium chloride 0.9% 92 mL IV amb pump       Rate verified and armband double check with second RN: yes, see mar    Patient education offered and stated understanding. Denies questions at this time.

## 2023-05-31 ENCOUNTER — Encounter: Admit: 2023-05-31 | Discharge: 2023-05-31 | Payer: MEDICARE

## 2023-06-04 ENCOUNTER — Encounter: Admit: 2023-06-04 | Discharge: 2023-06-04 | Payer: MEDICARE

## 2023-06-06 ENCOUNTER — Encounter: Admit: 2023-06-06 | Discharge: 2023-06-06 | Payer: MEDICARE

## 2023-06-12 ENCOUNTER — Encounter: Admit: 2023-06-12 | Discharge: 2023-06-12 | Payer: MEDICARE

## 2023-06-23 ENCOUNTER — Encounter: Admit: 2023-06-23 | Discharge: 2023-06-23

## 2023-06-26 ENCOUNTER — Encounter: Admit: 2023-06-26 | Discharge: 2023-06-26

## 2023-06-26 MED ORDER — SODIUM CHLORIDE 0.9% IV SOLP
1000 mL | Freq: Once | INTRAVENOUS | 0 refills | Status: CN
Start: 2023-06-26 — End: ?

## 2023-06-26 MED ORDER — PALONOSETRON 0.25 MG/5 ML IV SOLN
.25 mg | Freq: Once | INTRAVENOUS | 0 refills
Start: 2023-06-26 — End: ?

## 2023-06-26 MED ORDER — ATROPINE 0.4 MG/ML IJ SOLN
0.4 mg | INTRAVENOUS | 0 refills | PRN
Start: 2023-06-26 — End: ?

## 2023-06-27 ENCOUNTER — Encounter: Admit: 2023-06-27 | Discharge: 2023-06-27

## 2023-06-27 DIAGNOSIS — C181 Malignant neoplasm of appendix: Secondary | ICD-10-CM

## 2023-06-27 DIAGNOSIS — C786 Secondary malignant neoplasm of retroperitoneum and peritoneum: Secondary | ICD-10-CM

## 2023-06-27 DIAGNOSIS — R112 Nausea with vomiting, unspecified: Secondary | ICD-10-CM

## 2023-06-27 LAB — CBC AND DIFF
~~LOC~~ BKR ABSOLUTE BASO COUNT: 0.1 10*3/uL (ref 0.00–0.20)
~~LOC~~ BKR ABSOLUTE EOS COUNT: 0.3 10*3/uL (ref 0.00–0.45)
~~LOC~~ BKR ABSOLUTE LYMPH COUNT: 3 10*3/uL (ref 1.00–4.80)
~~LOC~~ BKR ABSOLUTE MONO COUNT: 1.7 10*3/uL — ABNORMAL HIGH (ref 0.00–0.80)
~~LOC~~ BKR ABSOLUTE NEUTROPHIL: 5.5 10*3/uL (ref 1.80–7.00)
~~LOC~~ BKR BASOPHILS %: 0.9 % (ref 0.0–2.0)
~~LOC~~ BKR EOSINOPHILS %: 3 % (ref 0.0–5.0)
~~LOC~~ BKR HEMATOCRIT: 23 % — ABNORMAL LOW (ref 36.0–45.0)
~~LOC~~ BKR HEMOGLOBIN: 8 g/dL — ABNORMAL LOW (ref 12.0–15.0)
~~LOC~~ BKR LYMPHOCYTES %: 28 % (ref 24.0–44.0)
~~LOC~~ BKR MCH: 33 pg (ref 26.0–34.0)
~~LOC~~ BKR MCHC: 33 g/dL (ref 32.0–36.0)
~~LOC~~ BKR MCV: 100 fL — ABNORMAL HIGH (ref 80.0–100.0)
~~LOC~~ BKR MONOCYTES %: 16 % — ABNORMAL HIGH (ref 4.0–12.0)
~~LOC~~ BKR MPV: 7.3 fL (ref 7.0–11.0)
~~LOC~~ BKR NEUTROPHILS %: 51 % (ref 41.0–77.0)
~~LOC~~ BKR PLATELET COUNT: 438 10*3/uL — ABNORMAL HIGH (ref 150–400)
~~LOC~~ BKR RBC COUNT: 2.3 10*6/uL — ABNORMAL LOW (ref 4.00–5.00)
~~LOC~~ BKR RDW: 15 % (ref 11.0–15.0)
~~LOC~~ BKR WBC COUNT: 10 10*3/uL (ref 4.50–11.00)

## 2023-06-27 LAB — COMPREHENSIVE METABOLIC PANEL
~~LOC~~ BKR ALBUMIN: 3 g/dL — ABNORMAL LOW (ref 3.5–5.0)
~~LOC~~ BKR ALK PHOSPHATASE: 288 U/L — ABNORMAL HIGH (ref 25–110)
~~LOC~~ BKR ALT: 72 U/L — ABNORMAL HIGH (ref 7–56)
~~LOC~~ BKR ANION GAP: 9 (ref 3–12)
~~LOC~~ BKR AST: 112 U/L — ABNORMAL HIGH (ref 7–40)
~~LOC~~ BKR BLD UREA NITROGEN: 32 mg/dL — ABNORMAL HIGH (ref 7–25)
~~LOC~~ BKR CALCIUM: 7.3 mg/dL — ABNORMAL LOW (ref 8.5–10.6)
~~LOC~~ BKR CHLORIDE: 104 mmol/L (ref 98–110)
~~LOC~~ BKR CO2: 27 mmol/L (ref 21–30)
~~LOC~~ BKR CREATININE: 3 mg/dL — ABNORMAL HIGH (ref 0.40–1.00)
~~LOC~~ BKR GLOMERULAR FILTRATION RATE (GFR): 16 mL/min — ABNORMAL LOW (ref >60)
~~LOC~~ BKR GLUCOSE, RANDOM: 101 mg/dL — ABNORMAL HIGH (ref 70–100)
~~LOC~~ BKR POTASSIUM: 3.1 mmol/L — ABNORMAL LOW (ref 3.5–5.1)
~~LOC~~ BKR TOTAL BILIRUBIN: 0.7 mg/dL (ref 0.2–1.3)
~~LOC~~ BKR TOTAL PROTEIN: 6.9 g/dL (ref 6.0–8.0)

## 2023-06-27 LAB — CEA(CARCINOEMBRYONIC AG): ~~LOC~~ BKR CEA: 134 ng/mL — ABNORMAL HIGH (ref <3.0)

## 2023-06-27 LAB — MAGNESIUM: ~~LOC~~ BKR MAGNESIUM: 1 mg/dL — ABNORMAL LOW (ref 1.6–2.6)

## 2023-06-27 LAB — CA19.9

## 2023-06-27 MED ORDER — ATROPINE 0.4 MG/ML IJ SOLN
.4 mg | INTRAVENOUS | 0 refills | Status: AC | PRN
Start: 2023-06-27 — End: ?

## 2023-06-27 MED ORDER — MAGNESIUM SULFATE IN D5W 1 GRAM/100 ML IV PGBK (CC ONLY)
1 g | INTRAVENOUS | 0 refills | Status: CP
Start: 2023-06-27 — End: ?
  Administered 2023-06-27 (×2): 1 g via INTRAVENOUS

## 2023-06-27 MED ORDER — IRINOTECAN IVPB
110 mg/m2 | Freq: Once | INTRAVENOUS | 0 refills | Status: CP
Start: 2023-06-27 — End: ?
  Administered 2023-06-27 (×3): 160.6 mg via INTRAVENOUS

## 2023-06-27 MED ORDER — FLUOROURACIL IV AMB PUMP
2200 mg/m2 | Freq: Once | INTRAVENOUS | 0 refills | Status: CP
Start: 2023-06-27 — End: ?
  Administered 2023-06-27 (×3): 3212 mg via INTRAVENOUS

## 2023-06-27 MED ORDER — SODIUM CHLORIDE 0.9% IV BOLUS
1000 mL | Freq: Once | INTRAVENOUS | 0 refills | Status: CP
Start: 2023-06-27 — End: ?
  Administered 2023-06-27: 14:00:00 1000 mL via INTRAVENOUS

## 2023-06-27 MED ORDER — APREPITANT 130 MG/18 ML (7.2 MG/ML) IV EMUL
130 mg | Freq: Once | INTRAVENOUS | 0 refills | Status: CP
Start: 2023-06-27 — End: ?
  Administered 2023-06-27: 16:00:00 130 mg via INTRAVENOUS

## 2023-06-27 MED ORDER — PALONOSETRON 0.25 MG/5 ML IV SOLN
.25 mg | Freq: Once | INTRAVENOUS | 0 refills | Status: CP
Start: 2023-06-27 — End: ?
  Administered 2023-06-27: 16:00:00 0.25 mg via INTRAVENOUS

## 2023-06-27 MED ORDER — BEVACIZUMAB-BVZR IVPB
5 mg/kg | Freq: Once | INTRAVENOUS | 0 refills | Status: DC
Start: 2023-06-27 — End: 2023-06-27

## 2023-06-27 NOTE — Progress Notes
 Cancer Therapeutics Note  Verified consent signed and in chart.    Blood return positive via: Port (Single)    BSA and dose double checked (agree with orders as written) with: Yes, RN    Labs/applicable tests checked: CBC and Comprehensive Metabolic Panel (CMP)    Treatment regimen: [id number]    irinotecan (CAMPTOSAR) 160.6 mg in dextrose 5% (D5) 508.03 mL IVPB   fluorouraciL (ADRUCIL) 3,212 mg in sodium chloride 0.9% 92 mL IV amb pump     Rate verified and armband double check with second RN: yes    Patient education offered and stated understanding. Denies questions at this time.    Patient arrived to CC treatment. VSS, port accessed and positive for blood return, and assessment complete; please refer to doc flowsheet for details. Premeds/IVF given per treatment plan. Irinotecan given w/o complications, patient tolerated well. Port positive for blood return, flushed with saline, and attached to ambulatory pump infusing 5FU. Cadd unhook scheduled locally. Patient declined copy of labs and AVS. All questions and concerns addressed. Patient left CC treatment w/ husband in stable condition.

## 2023-06-28 ENCOUNTER — Encounter: Admit: 2023-06-28 | Discharge: 2023-06-28

## 2023-06-30 ENCOUNTER — Encounter: Admit: 2023-06-30 | Discharge: 2023-06-30 | Payer: MEDICARE

## 2023-07-04 ENCOUNTER — Encounter: Admit: 2023-07-04 | Discharge: 2023-07-04 | Payer: MEDICARE

## 2023-07-04 DIAGNOSIS — C181 Malignant neoplasm of appendix: Secondary | ICD-10-CM

## 2023-07-04 DIAGNOSIS — K831 Obstruction of bile duct: Secondary | ICD-10-CM

## 2023-07-25 ENCOUNTER — Encounter: Admit: 2023-07-25 | Discharge: 2023-07-25 | Payer: MEDICARE

## 2023-07-26 ENCOUNTER — Ambulatory Visit: Admit: 2023-07-26 | Discharge: 2023-07-26 | Payer: MEDICARE

## 2023-07-26 ENCOUNTER — Encounter: Admit: 2023-07-26 | Discharge: 2023-07-26 | Payer: MEDICARE

## 2023-07-26 DIAGNOSIS — R7989 Other specified abnormal findings of blood chemistry: Secondary | ICD-10-CM

## 2023-07-26 DIAGNOSIS — C786 Secondary malignant neoplasm of retroperitoneum and peritoneum: Secondary | ICD-10-CM

## 2023-07-26 DIAGNOSIS — C181 Malignant neoplasm of appendix: Secondary | ICD-10-CM

## 2023-07-26 DIAGNOSIS — K87 Disorders of gallbladder, biliary tract and pancreas in diseases classified elsewhere: Secondary | ICD-10-CM

## 2023-07-26 DIAGNOSIS — R748 Abnormal levels of other serum enzymes: Secondary | ICD-10-CM

## 2023-07-26 DIAGNOSIS — Z4689 Encounter for fitting and adjustment of other specified devices: Secondary | ICD-10-CM

## 2023-07-26 LAB — CBC AND DIFF

## 2023-07-26 LAB — COMPREHENSIVE METABOLIC PANEL

## 2023-07-26 MED ORDER — LIDOCAINE (PF) 200 MG/10 ML (2 %) IJ SYRG
INTRAVENOUS | 0 refills | Status: DC
Start: 2023-07-26 — End: 2023-07-26

## 2023-07-26 MED ORDER — ONDANSETRON HCL (PF) 4 MG/2 ML IJ SOLN
INTRAVENOUS | 0 refills | Status: DC
Start: 2023-07-26 — End: 2023-07-26

## 2023-07-26 MED ORDER — CEFTRIAXONE 1 GRAM IJ SOLR
INTRAVENOUS | 0 refills | Status: DC
Start: 2023-07-26 — End: 2023-07-26

## 2023-07-26 MED ORDER — SUCCINYLCHOLINE CHLORIDE 20 MG/ML IJ SOLN
INTRAVENOUS | 0 refills | Status: DC
Start: 2023-07-26 — End: 2023-07-26

## 2023-07-26 MED ORDER — DEXAMETHASONE SODIUM PHOSPHATE 4 MG/ML IJ SOLN
INTRAVENOUS | 0 refills | Status: DC
Start: 2023-07-26 — End: 2023-07-26

## 2023-07-26 MED ORDER — PROPOFOL INJ 10 MG/ML IV VIAL
INTRAVENOUS | 0 refills | Status: DC
Start: 2023-07-26 — End: 2023-07-26

## 2023-07-26 NOTE — Anesthesia Pre-Procedure Evaluation
 Anesthesia Pre-Procedure Evaluation    Name: Vicki Buchanan      MRN: [id number]     DOB: 1955/06/02     Age: 68 y.o.     Sex: female   _________________________________________________________________________     Procedure Info:   Procedure Information       Date/Time: 07/26/23 0730    Procedure: ENDOSCOPIC RETROGRADE CHOLANGIOPANCREATOGRAPHY WITH REMOVAL AND EXCHANGE OF STENT BILIARY/ PANCREATIC DUCT - EACH STENT EXCHANGED - for stent exchan    Location: [facility] / ENDO/GI    Surgeons: Christa Course, MD            Physical Assessment  Vital Signs (last filed in past 24 hours):  BP: 180/79 (05/08 0700)  Temp: 36.8 ?C (98.2 ?F) (05/08 0700)  Pulse: 65 (05/08 0700)  Respirations: 21 PER MINUTE (05/08 0700)  SpO2: 100 % (05/08 0700)  O2 Device: None (Room air) (05/08 0700)  Height: 157.5 cm (5' 2) (05/08 1914)  Weight: 49.9 kg (110 lb) (05/08 0649)     No results found for: GLUPOC   Patient History   Allergies   Allergen Reactions    Indomethacin SEE COMMENTS     Pain for several days after suppository post ERCP. Tolerated IV but not PR    Shrimp SEE COMMENTS     Eye irritation. Namely blood vessel issues in the eyes         Current Medications    Medication Directions   apap-maalox-lidocaine 1:3:1 oral suspension (MIXTURE COMPOUND) Take 30 mL by mouth every 4 hours as needed.   calcium carbonate (TUMS) 500 mg (200 mg elemental calcium) chewable tablet Chew one tablet by mouth every 6 hours as needed.   carvediloL (COREG) 6.25 mg tablet Take one tablet by mouth twice daily. Take with food.  Hold if systolic blood pressure < 110   Cetirizine (ZYRTEC) 10 mg cap Take one capsule by mouth as Needed.   diphenoxylate-atropine (LOMOTIL) 2.5-0.025 mg tablet Take one tablet by mouth four times daily as needed for Diarrhea.   esomeprazole DR (NEXIUM) 20 mg capsule Take one capsule by mouth daily. Take on an empty stomach at least 1 hour before or 2 hours after food.   loperamide (IMODIUM A-D) 2 mg capsule Take one pill by mouth four times daily as needed   magnesium oxide 400 mg magnesium tablet Take one tablet by mouth twice daily.   ondansetron (ZOFRAN ODT) 8 mg rapid dissolve tablet DISSOLVE 1 TABLET UNDER THE TONGUE EVERY 8 HOURS AS NEEDED FOR NAUSEA AND VOMITING   potassium chloride (KLOR-CON SPRINKLE) 10 mEq capsule Take one capsule by mouth twice daily. If potassium < 3.5. Take with a meal and a full glass of water.   sod chlor,sod bicarb/neti pot (SINUS WASH NETI POT SINI) Use as directed   sodium chloride 0.9% (NS) 0.9 % 1,000 mL with potassium chloride 2 mEq/mL 20 mEq, magnesium sulfate 4 mEq/mL (50 %) 2 g IV infusion Administer 1L NS with 20meq K and 2g Mg once weekly via outpatient infusion.   traMADoL (ULTRAM) 50 mg tablet Take one tablet to two tablets by mouth daily as needed for Pain.       Review of Systems/Medical History      Patient summary reviewed  Pertinent labs reviewed    PONV Screening: Non-smoker and Female sex    No history of anesthetic complications    No family history of anesthetic complications      Airway  No TMJ      Previous grade 1 view with MAC 3      Pulmonary       Not a current smoker        No indications/hx of asthma      no COPD         No recent URI        No Obstructive Sleep Apnea      Lung metastases s/p radiation      Cardiovascular         Exercise tolerance: <4 METS      Beta Blocker therapy: No      Beta blockers within 24 hours: n/a      Hypertension, well controlled          No past MI        No PTCA        No palpitations      No angina      No indications/hx of CHF        No hyperlipidemia      GI/Hepatic/Renal             GERD, well controlled        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):         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)         No nausea      No vomiting        S/p HIPEC, 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  Daily Nexium for GERD         Neuro/Psych       No seizures      No hx TIA        No CVA      Neuropathy      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 Musculoskeletal         No neck pain      No back pain        Endocrine/Other       No diabetes        No hypothyroidism      No hyperthyroidism      Anemia        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    Abdominal Findings:         Abdomen soft      Bowel sounds normal.    Neurological Findings:       Alert and oriented x 3    Constitutional findings:       No acute distress      Well-developed      Well-nourished       Previous Airway Procedure Notes Displaying the 3 most recent records      Date Mask Difficulty Techninque used for succesful  ETT placement Blade Type Blade Size View with successful placement Number of attempts    04/26/23 0 - not attempted direct laryngoscopy Macintosh 3 grade I - full view of glottis 1    01/25/23 0 - not attempted direct laryngoscopy Macintosh 3 grade IIa - partial view of glottis 1    11/02/22 0 - not attempted direct laryngoscopy Macintosh 3 grade I - full view of glottis 1            Patient Lines/Drains/Airways Status       Active Lines:       Name Placement date Placement time Site Days    Implantable Port-a-cath Single Lumen 04/23/17 0845 Subclavian, Left 04/23/17  0845  Subclavian, Left  2285    Ileostomy 04/03/17 0951 Lower Right Quadrant 04/03/17  0951  Lower Right Quadrant  2305                  Diagnostic Tests  Hematology:   Lab Results   Component Value Date    HGB 8.0 (L) 06/27/2023    HCT 23.8 (L) 06/27/2023    PLTCT 438 (H) 06/27/2023    WBC 10.70 06/27/2023    NEUT 51.4 06/27/2023    ANC 5.50 06/27/2023    LYMPH 36 11/02/2022    ALC 3.00 06/27/2023    MONA 16.3 (H) 06/27/2023    AMC 1.70 (H) 06/27/2023    EOSA 3.0 06/27/2023    ABC 0.10 06/27/2023    BASOPHILS 1 11/02/2022    MCV 100.9 (H) 06/27/2023    MCH 33.7 06/27/2023    MCHC 33.4 06/27/2023    MPV 7.3 06/27/2023    RDW 15.0 06/27/2023         General Chemistry:   Lab Results   Component Value Date    NA 140 06/27/2023    K 3.1 (L) 06/27/2023    CL 104 06/27/2023    CO2 27 06/27/2023    GAP 9 06/27/2023    BUN 32 (H) 06/27/2023    CR 3.02 (H) 06/27/2023    GLU 101 (H) 06/27/2023    CA 7.3 (L) 06/27/2023    ALBUMIN 3.0 (L) 06/27/2023    MG 1.0 (L) 06/27/2023    TOTBILI 0.7 06/27/2023    PO4 3.3 01/30/2022      Coagulation:   Lab Results   Component Value Date    PT 13.8 (H) 04/26/2023    PTT 28.7 07/10/2018    INR 1.3 (H) 04/26/2023       PAC Plan    Anesthesia Plan    ASA score: 3   Plan: general and RSI  NPO status: acceptable      Informed Consent  Anesthetic plan and risks discussed with patient.  Use of blood products discussed with patient      Plan discussed with: anesthesiologist and CRNA.      Alerts

## 2023-07-26 NOTE — Anesthesia Post-Procedure Evaluation
 Post-Anesthesia Evaluation    Name: Vicki Buchanan      MRN: [id number]     DOB: 1955/12/27     Age: 68 y.o.     Sex: female   __________________________________________________________________________     Procedure Information       Anesthesia Start Date/Time: 07/26/23 0739    Procedure: ENDOSCOPIC RETROGRADE CHOLANGIOPANCREATOGRAPHY WITH REMOVAL AND EXCHANGE OF STENT BILIARY/ PANCREATIC DUCT - EACH STENT EXCHANGED - for stent exchan    Location: ENDO [facility] / ENDO/GI    Surgeons: Christa Course, MD            Post-Anesthesia Vitals  BP: 100/76 (05/08 0825)  Temp: 36.9 ?C (98.4 ?F) (05/08 4540)  Pulse: 66 (05/08 0825)  Respirations: 16 PER MINUTE (05/08 0825)  SpO2: 99 % (05/08 0825)  O2 Device: None (Room air) (05/08 0825)   Vitals Value Taken Time   BP 100/76 07/26/23 08:25   Temp 36.9 ?C (98.4 ?F) 07/26/23 08:23   Pulse 66 07/26/23 08:25   Respirations 16 PER MINUTE 07/26/23 08:25   SpO2 99 % 07/26/23 08:25   O2 Device None (Room air) 07/26/23 08:25   ABP     ART BP           Post Anesthesia Evaluation Note    Evaluation location: Pre/Post  Patient participation: recovered; patient participated in evaluation  Level of consciousness: alert  Pain management: adequate    Hydration: normovolemia  Temperature: 36.0?C - 38.4?C  Airway patency: adequate    Perioperative Events       Post-op nausea and vomiting: no PONV    Postoperative Status  Cardiovascular status: hemodynamically stable  Respiratory status: spontaneous ventilation  Follow-up needed: none        Perioperative Events  There were no known complications for this encounter.

## 2023-07-28 ENCOUNTER — Encounter: Admit: 2023-07-28 | Discharge: 2023-07-28 | Payer: MEDICARE

## 2023-08-03 ENCOUNTER — Encounter: Admit: 2023-08-03 | Discharge: 2023-08-03 | Payer: MEDICARE

## 2023-08-03 MED ORDER — AMOXICILLIN-POT CLAVULANATE 875-125 MG PO TAB
1 | ORAL_TABLET | Freq: Two times a day (BID) | ORAL | 0 refills | 7.00000 days | Status: DC
Start: 2023-08-03 — End: 2023-08-03

## 2023-08-03 MED ORDER — AMOXICILLIN-POT CLAVULANATE 875-125 MG PO TAB
1 | ORAL_TABLET | Freq: Two times a day (BID) | ORAL | 0 refills | 7.00000 days | Status: AC
Start: 2023-08-03 — End: ?

## 2023-08-04 ENCOUNTER — Encounter: Admit: 2023-08-04 | Discharge: 2023-08-04 | Payer: MEDICARE

## 2023-08-05 ENCOUNTER — Encounter: Admit: 2023-08-05 | Discharge: 2023-08-05 | Payer: MEDICARE

## 2023-08-06 ENCOUNTER — Encounter: Admit: 2023-08-06 | Discharge: 2023-08-06 | Payer: MEDICARE

## 2023-08-07 ENCOUNTER — Encounter: Admit: 2023-08-07 | Discharge: 2023-08-07 | Payer: MEDICARE

## 2023-08-08 ENCOUNTER — Encounter: Admit: 2023-08-08 | Discharge: 2023-08-08 | Payer: MEDICARE

## 2023-08-10 ENCOUNTER — Encounter: Admit: 2023-08-10 | Discharge: 2023-08-10 | Payer: MEDICARE

## 2023-08-14 ENCOUNTER — Encounter: Admit: 2023-08-14 | Discharge: 2023-08-14 | Payer: MEDICARE

## 2023-08-14 ENCOUNTER — Ambulatory Visit: Admit: 2023-08-14 | Discharge: 2023-08-15 | Payer: MEDICARE

## 2023-08-15 ENCOUNTER — Encounter: Admit: 2023-08-15 | Discharge: 2023-08-15 | Payer: MEDICARE

## 2023-08-15 MED ORDER — LEVOFLOXACIN 250 MG PO TAB
ORAL_TABLET | ORAL | 0 refills | 7.00000 days | Status: AC
Start: 2023-08-15 — End: ?

## 2023-08-16 ENCOUNTER — Ambulatory Visit: Admit: 2023-08-16 | Discharge: 2023-08-16 | Payer: MEDICARE

## 2023-08-16 ENCOUNTER — Encounter: Admit: 2023-08-16 | Discharge: 2023-08-16 | Payer: MEDICARE

## 2023-08-17 ENCOUNTER — Encounter: Admit: 2023-08-17 | Discharge: 2023-08-17 | Payer: MEDICARE

## 2023-08-17 ENCOUNTER — Ambulatory Visit: Admit: 2023-08-17 | Discharge: 2023-08-17 | Payer: MEDICARE

## 2023-08-17 DIAGNOSIS — C787 Secondary malignant neoplasm of liver and intrahepatic bile duct: Secondary | ICD-10-CM

## 2023-08-17 DIAGNOSIS — C181 Malignant neoplasm of appendix: Secondary | ICD-10-CM

## 2023-08-17 DIAGNOSIS — C786 Secondary malignant neoplasm of retroperitoneum and peritoneum: Secondary | ICD-10-CM

## 2023-08-17 DIAGNOSIS — R7989 Other specified abnormal findings of blood chemistry: Secondary | ICD-10-CM

## 2023-08-17 NOTE — Progress Notes
 Date of Service: 08/20/2023    SUBJECTIVE:             Reason for Visit:  Heme/Onc Care    Vicki Buchanan is a 68 y.o. 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    02/19/17 diagnostic laparoscopy positive for mucinous neoplasm with low-grade features.    Planned for exploratory laparotomy with CRS/HIPEC with a TAH/BSO in 03/2017.  Developed intestinal obstruction and taken to OR for CRS/HIPEC surgery; multiple adhesions, omental cracking. CRS/HIPEC was aborted.  Immunotherapy on epacadostat + sirolimus trial 12/24/17-05/21/18.  Stopped study drug on 05/21/18 for mitomycin HIPEC trial in [place].    05/28/18 diagnostic laparoscopy, mucinous ascites 1.5L, peritoneal biopsy, laparoscopic HIPEC with mitomycin-C in [place] by Dr. Arthuro Billow.  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  Second laparoscopic HIPEC on 06/25/18.   09/12/18 debulking at [facility] in [place], [place] by Dr. Lavern Potash.    L pleural effusion requiring chest tube drainage; intra-abd abscess 10/2018;  Ureteral stents were removed and ostomy relocated in 10/2018.    Hospitalized in 01/2019 for AKI 2/2 dehydration and likely PE (V/Q scan and trop elevation).   CEA increasing again. CT CAP 03/20/19 with stable to slight increase in cancer and RUL nodule.  Observation and consider repeat CRS + HIPEC via Dr. Melodye Spurr in [place].    CT scans from 09/24/19 stable.    S/p RT to R lung met (50Gy to right lung 3/1-05/25/20)  CT scan on 08/17/20 showed a right lung mass slight increased from 11 mm to 14 mm; new bilateral lung nodules, increase in size of the peritoneal nodules. 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  TMB = 1.3.    S/p CRS w/ ostomy revision and tumor removal around stoma on 10/14/20.  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 [facility] in [facility] tumor board recommendations FOLFIRI/Avastin, hold Avastin 6-8 weeks post-op, repeat genomic testing [facility] 10/14/20).   Pre-screen for KRAS G12D inhibitor trial, not eligible so FOLFIRI +/- bevacizumab started 12/15/20.  02/16/21 CT scans abdominal wall spots stable; slight increase in lung nodules. Continue chemo.   CT scan on 08/03/2021 abdominal/peritoneal disease stable, lung metastasis slightly larger.  Elevated liver enzymes. U/S moderate ductal dilatation of liver. MRI MRCP 09/07/2021 extrinsic compression of common bile duct, intrahepatic ducts right and left w/ biliary dilatation.   CT scan 09/28/2021 overall stable disease. However, biliary obstruction, ureter obstruction, and hydronephrosis suggest growth. Continue chemo.  CT scan on 12/12/2021 shows the peritoneal mass is stable. The dilation of the bile ducts has improved. The nodules in the lung are stable. Continue chemo.  CT scan on 03/30/2022 shows that her lung nodules, abdominal wall, tumor in the abdomen, and the peritoneal carcinomatosis appears stable. Continue chemo..  CT scans from 02/26/2023 show stable cancer. Continue chemo.  05/29/23 CT scan: Lung metastasis: right upper lobe 4 cm, lower lobe 0.8 cm, stable. Abdomen: stable, calcified areas present. Tumor markers fluctuating; treatment interruptions and stent placement. Continue 5-FU, irinotecan but hold bevacizumab for three months given worsening kidney function. Treatment every 4 weeks, visit every other cycle (every 8 weeks) and imaging every 4 cycles (4 months/16 weeks)            Interval Hx 08/20/23:      The patient presents with liver  dysfunction and jaundice for further evaluation and management.    She reports yellow and dim eyesight. She has been experiencing itching, which mostly subsided by Friday after the stent was placed last Thursday. She denies fevers or chills, N/V but her appetite is down and she is not eating much.     Her hemoglobin was up to 8.4 after transfusion in [place], with a current level of 6.9. She has significant fatigue. She was given IV antibiotics while out of town as well, but now is on oral antibiotics.     She experiences significant gas pain, particularly on one side, and takes tramadol at home for pain relief, which aids her sleep. Persistent heartburn is present despite trying treatments like Tums, baking soda water, Nexium, and Prilosec. She requests a refill of a compounded medication containing Tylenol, Maalox, and lidocaine for heartburn.    Her creatinine levels have improved slightly, and she has been urinating well, with lighter urine. No fever or thrush in her mouth, but she feels dry. She has not been eating much, attributing it to heartburn, and describes her bowel movements as not watery but greenish dark in color. She denies edema, SOB, cough, confusion or rash.          Review of Systems   Constitutional:  Positive for activity change, appetite change, fatigue and unexpected weight change. Negative for chills, diaphoresis and fever.   HENT:  Negative for facial swelling, mouth sores, sore throat, trouble swallowing and voice change.    Eyes:  Positive for visual disturbance.   Respiratory:  Negative for cough, chest tightness, shortness of breath and wheezing.    Cardiovascular: Negative.  Negative for chest pain, palpitations and leg swelling.   Gastrointestinal:  Positive for abdominal pain. Negative for abdominal distention, anal bleeding, blood in stool, constipation, diarrhea, nausea, rectal pain and vomiting.   Genitourinary: Negative.  Negative for difficulty urinating, dysuria and hematuria.   Musculoskeletal: Negative.  Negative for arthralgias, back pain, neck pain and neck stiffness.   Skin:  Positive for color change. Negative for pallor, rash and wound.   Neurological:  Positive for weakness. Negative for dizziness, light-headedness, numbness and headaches.   Hematological:  Does not bruise/bleed easily. Psychiatric/Behavioral: Negative.  Negative for agitation. The patient is not nervous/anxious.        Past Medical History:    Acid reflux    Allergy    Bundle branch block    Cancer (CMS-HCC)    Cancer of appendix (CMS-HCC)    Cancer of colon (CMS-HCC)    Pneumonia    Sarcoma (CMS-HCC)     Surgical History:   Procedure Laterality Date    CESAREAN SECTION  1981    HX CHOLECYSTECTOMY  1995    LAPAROSCOPY  02/2017    biopsy peritoneal    COLONOSCOPY DIAGNOSTIC WITH SPECIMEN COLLECTION BY BRUSHING/ WASHING - FLEXIBLE N/A 03/19/2017    Performed by Margret Shepherd, MD at [facility] ENDO    COLONOSCOPY WITH BIOPSY - FLEXIBLE  03/19/2017    Performed by Margret Shepherd, MD at [facility] ENDO    EXPLORATORY LAPAROTOMY, DIVERTING LOOP ILEOSTOMY N/A 04/03/2017    Performed by Al-Kasspooles, Mazin, MD at [facility] OR    INSERTION TUNNELED CENTRAL VENOUS CATHETER - AGE 76 YEARS AND OVER Left 04/23/2017    Performed by Al-Kasspooles, Mazin, MD at [facility] OR    CYSTOURETHROSCOPY WITH INDWELLING URETERAL STENT INSERTION Bilateral 04/09/2018    Performed by Skeet Duke, MD at [facility] OR  CYSTOURETHROSCOPY WITH URETERAL CATHETERIZATION WITH/ WITHOUT IRRIGATION/ INSTILLATION/ URETEROPYELOGRAPHY Bilateral 04/09/2018    Performed by Skeet Duke, MD at [facility] OR    CYSTOURETHROSCOPY WITH INDWELLING URETERAL STENT EXCHANGE (RIGHT 6 x 26cm / LEFT 6 x 28cm) Bilateral 08/08/2018    Performed by Skeet Duke, MD at [facility] OR    RETROGRADE UROGRAPHY WITH/ WITHOUT KUB Bilateral 08/08/2018    Performed by Skeet Duke, MD at [facility] OR    ENDOSCOPIC RETROGRADE CHOLANGIOPANCREATOGRAPHY WITH SPHINCTEROTOMY/ PAPILLOTOMY N/A 09/16/2021    Performed by Karalee Oscar, MD at [facility] ENDO    ESOPHAGOGASTRODUODENOSCOPY WITH ENDOSCOPIC ULTRASOUND EXAMINATION - FLEXIBLE  09/16/2021    Performed by Karalee Oscar, MD at [facility] ENDO    ENDOSCOPIC RETROGRADE CHOLANGIOPANCREATOGRAPHY WITH PLACEMENT ENDOSCOPIC STENT INTO BILIARY/ PANCREATIC DUCT - EACH STENT  09/16/2021    Performed by Karalee Oscar, MD at [facility] ENDO    COLONOSCOPY DIAGNOSTIC WITH SPECIMEN COLLECTION BY BRUSHING/ WASHING - FLEXIBLE N/A 09/26/2021    Performed by Karalee Oscar, MD at [facility] OR    ENDOSCOPIC RETROGRADE CHOLANGIOPANCREATOGRAPHY WITH PLACEMENT ENDOSCOPIC STENT INTO BILIARY/ PANCREATIC DUCT - EACH STENT  09/29/2021    Performed by Christa Course, MD at [facility] ENDO    ENDOSCOPIC RETROGRADE CHOLANGIOPANCREATOGRAPHY with plastic stent exchange N/A 10/27/2021    Performed by Christa Course, MD at [facility] ENDO    ENDOSCOPIC RETROGRADE CHOLANGIOPANCREATOGRAPHY WITH ABLATION TUMOR/ POLYP/ OTHER LESION  10/27/2021    Performed by Christa Course, MD at [facility] ENDO    ENDOSCOPIC RETROGRADE CHOLANGIOPANCREATOGRAPHY WITH REMOVAL CALCULI/ DEBRIS FROM BILIARY/ PANCREATIC DUCT  10/27/2021    Performed by Christa Course, MD at [facility] ENDO    ENDOSCOPIC RETROGRADE CHOLANGIOPANCREATOGRAPHY  plastic stent replacement N/A 12/22/2021    Performed by Christa Course, MD at [facility] ENDO    ENDOSCOPIC RETROGRADE CHOLANGIOPANCREATOGRAPHY N/A 03/30/2022    Performed by Christa Course, MD at [facility] ENDO    ENDOSCOPIC RETROGRADE CHOLANGIOPANCREATOGRAPHY WITH REMOVAL AND EXCHANGE OF STENT BILIARY/ PANCREATIC DUCT - EACH STENT EXCHANGED N/A 03/30/2022    Performed by Christa Course, MD at [facility] ENDO    ENDOSCOPIC RETROGRADE CHOLANGIOPANCREATOGRAPHY WITH REMOVAL AND EXCHANGE OF STENT BILIARY/ PANCREATIC DUCT - EACH STENT EXCHANGED N/A 07/27/2022    Performed by Christa Course, MD at [facility] ENDO    ENDOSCOPIC RETROGRADE CHOLANGIOPANCREATOGRAPHY N/A 11/02/2022    Performed by Christa Course, MD at [facility] ENDO    ENDOSCOPIC RETROGRADE CHOLANGIOPANCREATOGRAPHY WITH REMOVAL AND EXCHANGE OF STENT BILIARY/ PANCREATIC DUCT - EACH STENT EXCHANGED N/A 11/02/2022    Performed by Christa Course, MD at [facility] ENDO    ENDOSCOPIC RETROGRADE CHOLANGIOPANCREATOGRAPHY WITH REMOVAL AND EXCHANGE OF STENT BILIARY/ PANCREATIC DUCT - EACH STENT EXCHANGED N/A 01/25/2023    Performed by Christa Course, MD at [facility] ENDO    ENDOSCOPIC RETROGRADE CHOLANGIOPANCREATOGRAPHY WITH REMOVAL AND EXCHANGE OF STENT BILIARY/ PANCREATIC DUCT - EACH STENT EXCHANGED N/A 04/26/2023    Performed by Christa Course, MD at [facility] ENDO    ENDOSCOPIC RETROGRADE CHOLANGIOPANCREATOGRAPHY WITH REMOVAL AND EXCHANGE OF STENT BILIARY/ PANCREATIC DUCT - EACH STENT EXCHANGED N/A 07/26/2023    Performed by Christa Course, MD at [facility] ENDO    ENDOSCOPIC RETROGRADE CHOLANGIOPANCREATOGRAPHY WITH REMOVAL CALCULI/ DEBRIS FROM BILIARY/ PANCREATIC DUCT N/A 08/16/2023    Performed by Christa Course, MD at [facility] ENDO    ENDOSCOPIC RETROGRADE CHOLANGIOPANCREATOGRAPHY WITH REMOVAL AND EXCHANGE OF STENT BILIARY/ PANCREATIC DUCT - EACH STENT EXCHANGED N/A 08/16/2023    Performed by Christa Course, MD at [facility] ENDO    ENDOSCOPIC RETROGRADE CHOLANGIOPANCREATOGRAPHY, WITH  NEOPLASM OR POLYP ABLATION N/A 08/16/2023    Performed by Christa Course, MD at [facility] ENDO    COLON SURGERY  7/20    COLON SURGERY  7/20    HX APPENDECTOMY  1/10    Guess on date    HX CESAREAN SECTION  4/81    HX HYSTERECTOMY  7/20    HX LOWER ANTERIOR RESECTION OF COLON      7/20    HX SALPINGO-OOPHORECTOMY      ILEOSTOMY OR JEJUNOSTOMY Right     LIVER DONOR SURGERY  12/24/21    Liver stents    OVARY SURGERY      Removed 7/20    PORTACATH PLACEMENT  1/20    PR PNCRTECT PROX STOT W/PANCREATOJEJUNOSTOMY  7/20    STOMACH SURGERY      7/20    TUNNELED VENOUS PORT PLACEMENT Left 1/19    URETER STENT PLACEMENT Bilateral      Family History   Problem Relation Name Age of Onset    Diabetes Mother Montel Antu     Cancer Father Porfirio Bristol     Diabetes Brother Darrow End     Cancer-Lung Paternal Aunt Doris     Cancer-Breast Maternal Grandmother Aurora Blowers     Diabetes Paternal Grandmother Annabelle      Social History     Socioeconomic History    Marital status: Married   Tobacco Use    Smoking status: Never    Smokeless tobacco: Never   Vaping Use    Vaping status: Never Used   Substance and Sexual Activity    Alcohol use: Not Currently     Comment: rarely    Drug use: Not Currently    Sexual activity: Yes     Partners: Male     Birth control/protection: Post-menopausal         OBJECTIVE:          acetaminophen/lidocaine/antacid(#) (GI COCKTAIL) 1:1:1 oral suspension Take 30 mL by mouth every 4 hours as needed.    amoxicillin-potassium clavulanate (AUGMENTIN) 875/125 mg tablet Take one tablet by mouth twice daily with meals.    apap-maalox-lidocaine 1:3:1 oral suspension (MIXTURE COMPOUND) Take 30 mL by mouth every 4 hours as needed.    calcium carbonate (TUMS) 500 mg (200 mg elemental calcium) chewable tablet Chew one tablet by mouth every 6 hours as needed.    carvediloL (COREG) 6.25 mg tablet Take one tablet by mouth twice daily. Take with food.  Hold if systolic blood pressure < 110    Cetirizine (ZYRTEC) 10 mg cap Take one capsule by mouth as Needed.    diphenoxylate-atropine (LOMOTIL) 2.5-0.025 mg tablet Take one tablet by mouth four times daily as needed for Diarrhea.    esomeprazole DR (NEXIUM) 20 mg capsule Take one capsule by mouth daily. Take on an empty stomach at least 1 hour before or 2 hours after food.    hydrOXYzine HCL (ATARAX) 25 mg tablet Take one tablet by mouth every 6 hours as needed for Itching.    levoFLOXacin (LEVAQUIN) 250 mg tablet Take three tablets by mouth daily for 1 day, THEN two tablets every 48 hours for 7 days. Take 3 tablets by mouth for 1 day then 48 hours later take 2 tablets every 48 hours for 7 days    loperamide (IMODIUM A-D) 2 mg capsule Take one pill by mouth four times daily as needed    magnesium oxide 400 mg magnesium tablet Take one tablet by mouth twice daily.  ondansetron (ZOFRAN ODT) 8 mg rapid dissolve tablet DISSOLVE 1 TABLET UNDER THE TONGUE EVERY 8 HOURS AS NEEDED FOR NAUSEA AND VOMITING    potassium chloride (KLOR-CON SPRINKLE) 10 mEq capsule Take one capsule by mouth twice daily. If potassium < 3.5. Take with a meal and a full glass of water.    sod chlor,sod bicarb/neti pot (SINUS WASH NETI POT SINI) Use as directed    sodium chloride 0.9% (NS) 0.9 % 1,000 mL with potassium chloride 2 mEq/mL 20 mEq, magnesium sulfate 4 mEq/mL (50 %) 2 g IV infusion Administer 1L NS with 20meq K and 2g Mg once weekly via outpatient infusion.    traMADoL (ULTRAM) 50 mg tablet Take one tablet to two tablets by mouth daily as needed for Pain.     Vitals:    08/20/23 0802 08/20/23 0803   BP:  (!) 180/86   BP Source:  Arm, Left Upper   Pulse:  75   Temp:  36.2 ?C (97.2 ?F)   SpO2:  99%   TempSrc:  Temporal   PainSc:  Two   Weight: 50.8 kg (112 lb) 50.8 kg (112 lb)                 Body mass index is 20.49 kg/m?Aaron Aas     Pain Score: Two       Pain Addressed:  N/A    Patient Evaluated for a Clinical Trial: Patient not eligible for a treatment trial (including not needing treatment, needs palliative care, in remission).     [place] Cooperative Oncology Group performance status is 1, Restricted in physically strenuous activity but ambulatory and able to carry out work of a light or sedentary nature, e.g., light house work, office work.       Physical Exam  Vitals reviewed.   Constitutional:       General: She is not in acute distress.     Appearance: Normal appearance. She is well-developed. She is not ill-appearing, toxic-appearing or diaphoretic.   HENT:      Head: Normocephalic and atraumatic.      Nose: Nose normal. No rhinorrhea.      Mouth/Throat:      Mouth: Mucous membranes are moist. Mucous membranes are not pale. No oral lesions.      Pharynx: Oropharynx is clear. No oropharyngeal exudate or posterior oropharyngeal erythema.      Tonsils: No tonsillar abscesses.   Eyes:      General: Scleral icterus present.         Right eye: No discharge.         Left eye: No discharge.      Extraocular Movements: Extraocular movements intact.      Conjunctiva/sclera: Conjunctivae normal.      Pupils: Pupils are equal, round, and reactive to light.   Cardiovascular:      Rate and Rhythm: Normal rate and regular rhythm.      Pulses: Normal pulses.      Heart sounds: Normal heart sounds. No murmur heard.     No gallop.   Pulmonary:      Effort: Pulmonary effort is normal. No respiratory distress.      Breath sounds: No stridor. No wheezing, rhonchi or rales.   Chest:      Chest wall: No tenderness.      Comments: Port site WNL  Abdominal:      General: A surgical scar is present. The ostomy site is clean. Bowel sounds are decreased. There is no  distension.      Palpations: There is no mass.      Tenderness: There is no abdominal tenderness. There is no guarding or rebound.      Hernia: No hernia is present.          Comments: Firmness in epigastric area.    Musculoskeletal:         General: No swelling, tenderness, deformity or signs of injury. Normal range of motion.      Cervical back: Normal range of motion and neck supple. No rigidity. No muscular tenderness.      Right lower leg: No edema.      Left lower leg: No edema.   Lymphadenopathy:      Cervical: No cervical adenopathy.   Skin:     General: Skin is warm and dry.      Coloration: Skin is jaundiced. Skin is not pale.      Findings: No bruising, erythema, lesion or rash.   Neurological:      General: No focal deficit present.      Mental Status: She is alert and oriented to person, place, and time. Mental status is at baseline.      Cranial Nerves: No cranial nerve deficit.      Motor: No weakness.      Coordination: Coordination normal.      Gait: Gait normal.   Psychiatric:         Mood and Affect: Mood normal.         Behavior: Behavior normal.         Thought Content: Thought content normal.         Judgment: Judgment normal.            CBC w/DIFF      Latest Ref Rng & Units 08/20/2023     7:41 AM 08/14/2023    12:00 AM 06/27/2023     8:47 AM 05/30/2023     8:00 AM 04/26/2023     7:11 AM   CBC with Diff   WBC 4.50 - 11.00 10*3/uL 20.20  15.50  10.70  9.40  9.90    RBC 4.00 - 5.00 10*6/uL 2.04  2.60  2.36  2.39  2.55    Hemoglobin 12.0 - 15.0 g/dL 6.9  8.4  8.0  8.0  8.6    Hematocrit 36.0 - 45.0 % 20.0  24.0  23.8  23.8  25.2    MCV 80.0 - 100.0 fL 98.1  92.3  100.9  99.5  99.1    MCH 26.0 - 34.0 pg 33.8  32.3  33.7  33.4  33.9    MCHC 32.0 - 36.0 g/dL 29.5  62.1  30.8  65.7  34.2    RDW 11.0 - 15.0 % 22.9  25.1  15.0  14.9  16.9    Platelet Count 150 - 400 10*3/uL 338  384  438  439  463    MPV 7.0 - 11.0 fL 8.4  10.8  7.3  7.1  7.6    Neurtrophils 41.0 - 77.0 % 60.1   51.4  55.0  57    Absolute Neutrophils 1.80 - 7.00 10*3/uL 12.10   5.50  5.20  5.60    Absolute Lymph Count 1.00 - 4.80 10*3/uL 5.40   3.00  2.60  2.30    Absolute Monocyte Count 0.00 - 0.80 10*3/uL 2.20   1.70  1.10  1.50    Eosinophils 0.0 - 5.0 % 0.8   3.0  4.8  4    Absolute Eosinophil Count 0.00 - 0.45 10*3/uL 0.20   0.30  0.40  0.40    Basophils 0.0 - 2.0 % 1.5   0.9  0.7  1      Comprehensive Metabolic Profile      Latest Ref Rng & Units 08/20/2023     7:41 AM 08/14/2023    12:00 AM 06/27/2023     8:47 AM 05/30/2023     8:00 AM 04/26/2023     7:11 AM   CMP   Sodium 137 - 147 mmol/L 136  140  140  138  144    Potassium 3.5 - 5.1 mmol/L 3.8  4.1  3.1  3.5  3.7    Chloride 98 - 110 mmol/L 101  108  104  101  100    CO2 21 - 30 mmol/L 26  22.0  27  29  28     Anion Gap 3 - 12 9   9  8  16     Blood Urea Nitrogen 7 - 25 mg/dL 43  16.1  32  30  44    Creatinine 0.40 - 1.00 mg/dL 0.96  0.45  4.09  8.11  3.19    Glucose 70 - 100 mg/dL 96  914  782  956  213    Calcium 8.5 - 10.6 mg/dL 9.0  9.4  7.3  8.7  8.7    Total Protein 6.0 - 8.0 g/dL 6.9  8.2  6.9  7.8  7.8    Albumin 3.5 - 5.0 g/dL 3.0  2.2  3.0  3.4  3.4    Alk Phosphatase 25 - 110 U/L 567  656  288  305  259    ALT (SGPT) 7 - 56 U/L 97  79  72  33  19    AST 7 - 40 U/L 146  148  112  52  30    Total Bilirubin 0.2 - 1.3 mg/dL 08.6  57.84  0.7  0.5  0.4    GFR >60 mL/min 17   16  15  15       Tumor Markers  Lab Results   Component Value Date    CEA 1,341.3 (H) 06/27/2023    CEA 1,857.6 (H) 05/30/2023    CEA 1,689.6 (H) 04/26/2023    CEA 1,619.9 (H) 03/28/2023    CEA 1,700.8 (H) 02/28/2023    CEA 1,097.3 (H) 01/25/2023    CEA 603.6 (H) 12/06/2022 CEA 475.6 (H) 11/02/2022    CEA 462.4 (H) 10/11/2022    CEA 658.2 (H) 09/13/2022    CA199 <0.8 06/27/2023    CA199 <0.8 05/30/2023    CA199 <0.8 04/26/2023    CA199 <0.8 03/28/2023    CA199 <0.8 02/28/2023    CA199 <1 01/25/2023    CA199 <1 12/06/2022    CA199 <1 11/02/2022    CA199 <1 10/11/2022    CA199 <1 09/13/2022    CA125 41 (H) 01/21/2018    CA125 42 (H) 11/21/2017    CA125 46 (H) 11/05/2017    CA125 41 (H) 10/22/2017    CA125 34 10/08/2017    CA125 34 09/24/2017    CA125 31 09/10/2017    CA125 29 08/27/2017    CA125 34 08/15/2017    CA125 39 (H) 07/30/2017           Imaging:  - 04/27/21 CT CAP without contrast  CHEST:   1.  No significant change in  small indeterminate anterior epiphrenic lymph nodes. No new thoracic lymphadenopathy.   2. No significant change to further mild increase in size of pulmonary and pleural metastases.   3. Increasing nodular and tree-in-bud opacities in the lingula and development of mild focal consolidation in the left lower lobe, indeterminate and may be infectious/inflammatory nodules or potentially pulmonary metastases.       ABDOMEN AND PELVIS:   1.  Grossly unchanged extensive peritoneal carcinomatosis.   2. No significant change in small residual indeterminate retroperitoneal lymph nodes. No discrete new abdominopelvic lymphadenopathy.   3. Grossly unchanged right anterior abdominal wall metastases.     GI ENDO CATH BIL & PANC DUCT  This order has been auto finalized and does not contain a result.    - Urinalysis from 01/25/2023 indicated a 3+ leukocyte count, while the nitrite was negative.     - Labs obtained on 03/28/2023 show white blood count is slightly elevated. Creatinine and BUN are high at 2.89 and BUN is 29.   - CEA level is increasing.   - Magnesium level is 1.3.    ASSESSMENT AND PLAN:    1. Metastatic appendiceal carcinoma. Signet ring adenocarcinoma, MSS, with peritoneal carcinomatosis with  pseudomyxoma peritonei.  02/19/17 diagnostic laparoscopy with biopsies of the right diaphragm, right liver, right fallopian tube and left pelvis.  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   Planned for exploratory laparotomy with CRS/HIPEC with a TAH/BSO in 03/2017.  Developed intestinal obstruction and taken to OR for CRS/HIPEC surgery; multiple adhesions, omental cracking. CRS/HIPEC was aborted.  Immunotherapy on epacadostat + sirolimus trial 12/24/17-05/21/18.  Stopped study drug on 05/21/18 for mitomycin HIPEC trial in [place].    05/28/18 diagnostic laparoscopy, mucinous ascites 1.5L, peritoneal biopsy, laparoscopic HIPEC with mitomycin-C in [place] by Dr. Arthuro Billow.  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  Second laparoscopic HIPEC on 06/25/18.   09/12/18 debulking at [facility] in [place], [place] by Dr. Lavern Potash.    L pleural effusion requiring chest tube drainage; intra-abd abscess 10/2018;  Ureteral stents were removed and ostomy relocated in 10/2018.    Hospitalized in 01/2019 for AKI 2/2 dehydration and likely PE (V/Q scan and trop elevation).   CEA increasing again. CT CAP 03/20/19 with stable to slight increase in cancer and RUL nodule.  Observation and consider repeat CRS + HIPEC via Dr. Melodye Spurr in [place].    CT scans from 09/24/19 stable.    S/p RT to R lung met (50Gy to right lung 3/1-05/25/20)  CT scan on 08/17/20 showed a right lung mass slight increased from 11 mm to 14 mm; new bilateral lung nodules, increase in size of the peritoneal nodules. 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  TMB = 1.3.    S/p CRS w/ ostomy revision and tumor removal around stoma on 10/14/20.  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   [facility] in [facility] tumor board recommendations FOLFIRI/Avastin, hold Avastin 6-8 weeks post-op, repeat genomic testing [facility] 10/14/20).   Pre-screen for KRAS G12D inhibitor trial, not eligible so FOLFIRI +/- bevacizumab started 12/15/20.  02/16/21 CT scans abdominal wall spots stable; slight increase in lung nodules. Continue chemo.   CT scan on 08/03/2021 abdominal/peritoneal disease stable, lung metastasis slightly larger.  Elevated liver enzymes. U/S moderate ductal dilatation of liver. MRI MRCP 09/07/2021 extrinsic compression of common bile duct, intrahepatic ducts  right and left w/ biliary dilatation.   CT scan 09/28/2021 overall stable disease. However, biliary obstruction, ureter obstruction, and hydronephrosis suggest growth. Continue chemo.  CT scan on 12/12/2021 shows the peritoneal mass is stable. The dilation of the bile ducts has improved. The nodules in the lung are stable. Continue chemo.  CT scan on 03/30/2022 shows that her lung nodules, abdominal wall, tumor in the abdomen, and the peritoneal carcinomatosis appears stable. Continue chemo..  CT scans from 02/26/2023 show stable cancer. Continue chemo.  05/29/23 CT scan: Lung metastasis: right upper lobe 4 cm, lower lobe 0.8 cm, stable. Abdomen: stable, calcified areas present. Tumor markers fluctuating; treatment interruptions and stent placement. Continue 5-FU, irinotecan but hold bevacizumab for three months given worsening kidney function. 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 Current stents placed last week with GI but today further jaundice and elevated bilirubin 18. Symptomatic with yellowing and itching of skin, fatigue, vision changes, elevated white count, abdominal pain, anorexia. Outpatient ERCP last week with no improvement in bilirubin, further intervention required. She is open to external drains if needed with IR  - Admit to hospital for evaluation and management.  - Consult GI and IR for stent/drainage procedures.  -blood cultures, type and cross and then IVF, cefepime. blood transfusion in hospital  -defer chemo.  -we will see her back in clinic after discharge.     Anemia with low hemoglobin  Anemia with hemoglobin at 6.9, decreased from 8. Significant fatigue and weakness reported. Blood transfusion indicated.  - Order blood transfusion.  - Obtain blood cultures and type and crossmatch for transfusion.    High white blood cell count  Elevated white blood cell count suggests possible infection. Previous Augmentin ineffective; currently on Levaquin with dose adjustments for kidney function. No fever.  - Switch to cefepime.  - Monitor white blood cell count and signs of infection.    Chronic kidney disease, unspecified  Chronic kidney disease with recent improvement in creatinine levels. Antibiotic dosing adjusted for kidney function.  - Monitor kidney function and adjust medications as needed.    Heartburn  Chronic heartburn with inadequate relief from current medications. Requests refill of compounded mouthwash for symptomatic relief.  - Send refill for compounded mouthwash containing Tylenol, Maalox, and lidocaine.  - Evaluate and adjust acid-reducing medications as needed in the hospital.           Dorothea Gata, APRN-NP          The patient and family were allowed to ask questions and voice concerns; these were addressed to the best of our ability. They expressed understanding of what was explained to them, and they agreed with the present plan. RTC in 2 weeks with labs to see a provider. Patient has the phone numbers for the Cancer Center and was instructed on how to contact us  with any questions or concerns. My collaborating physician on this case is Dr. Arlyce Lambert.

## 2023-08-17 NOTE — Progress Notes
 Labs ordered for stent exchange ERCP.

## 2023-08-18 ENCOUNTER — Encounter: Admit: 2023-08-18 | Discharge: 2023-08-18 | Payer: MEDICARE

## 2023-08-19 ENCOUNTER — Encounter: Admit: 2023-08-19 | Discharge: 2023-08-19 | Payer: MEDICARE

## 2023-08-20 ENCOUNTER — Encounter: Admit: 2023-08-20 | Discharge: 2023-08-20 | Payer: MEDICARE

## 2023-08-20 ENCOUNTER — Inpatient Hospital Stay: Admit: 2023-08-20 | Discharge: 2023-08-20 | Payer: MEDICARE

## 2023-08-20 DIAGNOSIS — C181 Malignant neoplasm of appendix: Secondary | ICD-10-CM

## 2023-08-20 DIAGNOSIS — C786 Secondary malignant neoplasm of retroperitoneum and peritoneum: Secondary | ICD-10-CM

## 2023-08-20 DIAGNOSIS — E8729 High anion gap metabolic acidosis: Secondary | ICD-10-CM

## 2023-08-20 DIAGNOSIS — D649 Anemia, unspecified: Secondary | ICD-10-CM

## 2023-08-20 DIAGNOSIS — A419 Sepsis, unspecified organism: Secondary | ICD-10-CM

## 2023-08-20 DIAGNOSIS — G9341 Metabolic encephalopathy: Secondary | ICD-10-CM

## 2023-08-20 DIAGNOSIS — E872 Lactic acidosis: Secondary | ICD-10-CM

## 2023-08-20 DIAGNOSIS — K831 Obstruction of bile duct: Secondary | ICD-10-CM

## 2023-08-20 DIAGNOSIS — K8309 Other cholangitis: Principal | ICD-10-CM

## 2023-08-20 LAB — TYPE & CROSSMATCH
~~LOC~~ BKR ANTIBODY SCREEN: NEGATIVE
~~LOC~~ BKR UNITS ORDERED: 1

## 2023-08-20 LAB — COMPREHENSIVE METABOLIC PANEL
~~LOC~~ BKR ALBUMIN: 3 g/dL — ABNORMAL LOW (ref 3.5–5.0)
~~LOC~~ BKR ALK PHOSPHATASE: 567 U/L — ABNORMAL HIGH (ref 25–110)
~~LOC~~ BKR ALT: 97 U/L — ABNORMAL HIGH (ref 7–56)
~~LOC~~ BKR AST: 146 U/L — ABNORMAL HIGH (ref 7–40)
~~LOC~~ BKR BLD UREA NITROGEN: 43 mg/dL — ABNORMAL HIGH (ref 7–25)
~~LOC~~ BKR CALCIUM: 9 mg/dL (ref 8.5–10.6)
~~LOC~~ BKR CHLORIDE: 101 mmol/L (ref 98–110)
~~LOC~~ BKR CREATININE: 2.8 mg/dL — ABNORMAL HIGH (ref 0.40–1.00)
~~LOC~~ BKR GLOMERULAR FILTRATION RATE (GFR): 17 mL/min — ABNORMAL LOW (ref >60–0.80)
~~LOC~~ BKR GLUCOSE, RANDOM: 96 mg/dL (ref 70–100)
~~LOC~~ BKR POTASSIUM: 3.8 mmol/L — ABNORMAL LOW (ref 3.5–5.1)
~~LOC~~ BKR SODIUM, SERUM: 136 mmol/L — ABNORMAL LOW (ref 137–147)
~~LOC~~ BKR TOTAL BILIRUBIN: 17 mg/dL — ABNORMAL HIGH (ref 0.2–1.3)
~~LOC~~ BKR TOTAL PROTEIN: 6.9 g/dL (ref 6.0–8.0)

## 2023-08-20 LAB — URINALYSIS DIPSTICK
~~LOC~~ BKR GLUCOSE,UA: NEGATIVE
~~LOC~~ BKR LEUKOCYTES: NEGATIVE
~~LOC~~ BKR NITRITE: NEGATIVE
~~LOC~~ BKR URINE BILE: POSITIVE — AB
~~LOC~~ BKR URINE KETONE: NEGATIVE
~~LOC~~ BKR URINE PH: 7 (ref 5.0–8.0)
~~LOC~~ BKR URINE SPEC GRAVITY: 1 (ref 1.005–1.030)

## 2023-08-20 LAB — PROTIME INR (PT)
~~LOC~~ BKR INR: 5.8 — ABNORMAL HIGH (ref 0.9–1.2)
~~LOC~~ BKR INR: 6 — ABNORMAL HIGH (ref 0.9–1.2)
~~LOC~~ BKR PROTIME: 62 s — ABNORMAL HIGH (ref 9.9–14.2)
~~LOC~~ BKR PROTIME: 64 s — ABNORMAL HIGH (ref 9.9–14.2)

## 2023-08-20 LAB — CBC AND DIFF
~~LOC~~ BKR ABSOLUTE LYMPH COUNT: 5.4 10*3/uL — ABNORMAL HIGH (ref 1.00–4.80)
~~LOC~~ BKR ABSOLUTE NEUTROPHIL: 12 10*3/uL — ABNORMAL HIGH (ref 1.80–7.00)
~~LOC~~ BKR RBC COUNT: 2 10*6/uL — ABNORMAL LOW (ref 4.00–5.00)
~~LOC~~ BKR WBC COUNT: 20 10*3/uL — ABNORMAL HIGH (ref 4.50–11.00)

## 2023-08-20 LAB — CEA(CARCINOEMBRYONIC AG): ~~LOC~~ BKR CEA: 256 ng/mL — ABNORMAL HIGH (ref <3.0)

## 2023-08-20 LAB — RETICULOCYTE COUNT
~~LOC~~ BKR ABSOLUTE RETIC CT: 73 10*3/uL (ref 30.0–94.0)
~~LOC~~ BKR CORRECTED RETIC: 1.7 %
~~LOC~~ BKR UNCORRECTED RETIC: 3.6 % — ABNORMAL HIGH (ref 0.5–2.0)

## 2023-08-20 LAB — BILIRUBIN, DIRECT: ~~LOC~~ BKR DIRECT BILIRUBIN: 12 mg/dL — ABNORMAL HIGH (ref <0.4)

## 2023-08-20 LAB — MAGNESIUM: ~~LOC~~ BKR MAGNESIUM: 1.6 mg/dL (ref 1.6–2.6)

## 2023-08-20 LAB — PTT (APTT): ~~LOC~~ BKR PTT: 43 s — ABNORMAL HIGH (ref 24.0–36.5)

## 2023-08-20 LAB — CA19.9: ~~LOC~~ BKR CA 19-9: 2.7 U/mL (ref <35.0)

## 2023-08-20 LAB — LACTIC ACID(LACTATE): ~~LOC~~ BKR LACTIC ACID: 1.1 mmol/L (ref 0.5–2.0)

## 2023-08-20 MED ORDER — CETIRIZINE 10 MG PO TAB
10 mg | Freq: Every day | ORAL | 0 refills | Status: DC | PRN
Start: 2023-08-20 — End: 2023-09-08

## 2023-08-20 MED ORDER — LOPERAMIDE 2 MG PO CAP
2 mg | Freq: Four times a day (QID) | ORAL | 0 refills | Status: DC | PRN
Start: 2023-08-20 — End: 2023-09-08

## 2023-08-20 MED ORDER — SODIUM CHLORIDE 0.9% IV BOLUS
1000 mL | Freq: Once | INTRAVENOUS | 0 refills | Status: CP
Start: 2023-08-20 — End: ?
  Administered 2023-08-20: 16:00:00 1000 mL via INTRAVENOUS

## 2023-08-20 MED ORDER — ONDANSETRON HCL (PF) 4 MG/2 ML IJ SOLN
4 mg | INTRAVENOUS | 0 refills | Status: DC | PRN
Start: 2023-08-20 — End: 2023-09-09
  Administered 2023-08-26 – 2023-09-05 (×17): 4 mg via INTRAVENOUS

## 2023-08-20 MED ORDER — CALCIUM CARBONATE 200 MG CALCIUM (500 MG) PO CHEW
500 mg | ORAL | 0 refills | Status: DC | PRN
Start: 2023-08-20 — End: 2023-09-08
  Administered 2023-08-21 – 2023-09-02 (×3): 500 mg via ORAL

## 2023-08-20 MED ORDER — ONDANSETRON 4 MG PO TBDI
4 mg | ORAL | 0 refills | Status: DC | PRN
Start: 2023-08-20 — End: 2023-09-08
  Administered 2023-08-22 – 2023-09-06 (×6): 4 mg via ORAL

## 2023-08-20 MED ORDER — MELATONIN 5 MG PO TAB
5 mg | Freq: Every evening | ORAL | 0 refills | Status: DC | PRN
Start: 2023-08-20 — End: 2023-09-08
  Administered 2023-08-22 – 2023-08-23 (×2): 5 mg via ORAL

## 2023-08-20 MED ORDER — HYDRALAZINE 10 MG PO TAB
20 mg | ORAL | 0 refills | Status: DC | PRN
Start: 2023-08-20 — End: 2023-08-21
  Administered 2023-08-20: 23:00:00 20 mg via ORAL

## 2023-08-20 MED ORDER — PHYTONADIONE IVPB
10 mg | Freq: Every day | INTRAVENOUS | 0 refills | Status: DC
Start: 2023-08-20 — End: 2023-08-22
  Administered 2023-08-20 – 2023-08-21 (×4): 10 mg via INTRAVENOUS

## 2023-08-20 MED ORDER — SENNOSIDES-DOCUSATE SODIUM 8.6-50 MG PO TAB
1 | Freq: Every day | ORAL | 0 refills | Status: DC | PRN
Start: 2023-08-20 — End: 2023-09-08

## 2023-08-20 MED ORDER — HYDROXYZINE HCL 25 MG PO TAB
25 mg | ORAL | 0 refills | Status: DC | PRN
Start: 2023-08-20 — End: 2023-09-08

## 2023-08-20 MED ORDER — ACETAMINOPHEN 500 MG PO TAB
500 mg | ORAL | 0 refills | Status: DC | PRN
Start: 2023-08-20 — End: 2023-09-08
  Administered 2023-08-27 – 2023-09-08 (×3): 500 mg via ORAL

## 2023-08-20 MED ORDER — SODIUM CHLORIDE 0.9% IV SOLP
INTRAVENOUS | 0 refills | Status: DC
Start: 2023-08-20 — End: 2023-08-21
  Administered 2023-08-20 – 2023-08-21 (×2): 1000.0000 mL via INTRAVENOUS

## 2023-08-20 MED ORDER — CYANOCOBALAMIN (VITAMIN B-12) 1,000 MCG/ML IJ SOLN
1000 ug | Freq: Every day | INTRAMUSCULAR | 0 refills | Status: CP
Start: 2023-08-20 — End: ?
  Administered 2023-08-21 – 2023-08-27 (×7): 1000 ug via INTRAMUSCULAR

## 2023-08-20 MED ORDER — PIPERACILLIN/TAZOBACTAM 4.5 G/100ML NS IVPB (MB+)
4.5 g | Freq: Two times a day (BID) | INTRAVENOUS | 0 refills | Status: DC
Start: 2023-08-20 — End: 2023-08-24
  Administered 2023-08-21 – 2023-08-24 (×16): 4.5 g via INTRAVENOUS

## 2023-08-20 MED ORDER — HYDRALAZINE 10 MG PO TAB
20 mg | ORAL | 0 refills | Status: DC | PRN
Start: 2023-08-20 — End: 2023-08-20

## 2023-08-20 MED ORDER — ENOXAPARIN 30 MG/0.3 ML SC SYRG
30 mg | Freq: Every day | SUBCUTANEOUS | 0 refills | Status: DC
Start: 2023-08-20 — End: 2023-08-20

## 2023-08-20 MED ORDER — TRAMADOL 50 MG PO TAB
50 mg | ORAL | 0 refills | Status: DC | PRN
Start: 2023-08-20 — End: 2023-08-22
  Administered 2023-08-22 (×2): 50 mg via ORAL

## 2023-08-20 MED ORDER — POLYETHYLENE GLYCOL 3350 17 GRAM PO PWPK
1 | Freq: Every day | ORAL | 0 refills | Status: DC | PRN
Start: 2023-08-20 — End: 2023-09-08

## 2023-08-20 MED ORDER — DIPHENOXYLATE-ATROPINE 2.5-0.025 MG PO TAB
1 | Freq: Four times a day (QID) | ORAL | 0 refills | Status: DC | PRN
Start: 2023-08-20 — End: 2023-09-08

## 2023-08-20 MED ORDER — CEFEPIME 2 GRAM IVP
2 g | Freq: Once | INTRAVENOUS | 0 refills | Status: CP
Start: 2023-08-20 — End: ?
  Administered 2023-08-20: 16:00:00 2 g via INTRAVENOUS

## 2023-08-20 MED ORDER — HEPARIN, PORCINE (PF) 5,000 UNIT/0.5 ML IJ SYRG
5000 [IU] | SUBCUTANEOUS | 0 refills | Status: DC
Start: 2023-08-20 — End: 2023-09-08
  Administered 2023-08-21 – 2023-09-04 (×29): 5000 [IU] via SUBCUTANEOUS

## 2023-08-20 MED ORDER — ALTEPLASE 2 MG IK SOLR
2 mg | Freq: Two times a day (BID) | 0 refills | Status: DC | PRN
Start: 2023-08-20 — End: 2023-09-08

## 2023-08-20 MED ORDER — RXAMB LIDOCAINE-ANTACID 1:2 ORAL SUSPENSION (MIXTURE COMPOUND)
ORAL | 0 refills | Status: CN | PRN
Start: 2023-08-20 — End: ?

## 2023-08-20 MED ORDER — PANTOPRAZOLE 40 MG PO TBEC
40 mg | Freq: Every day | ORAL | 0 refills | Status: DC
Start: 2023-08-20 — End: 2023-08-22
  Administered 2023-08-21 – 2023-08-22 (×2): 40 mg via ORAL

## 2023-08-20 MED ORDER — ACETAMINOPHEN/LIDOCAINE/ANTACID(#) 1:1:1 PO SUSP
30 mL | ORAL | 3 refills | Status: SS | PRN
Start: 2023-08-20 — End: ?

## 2023-08-20 NOTE — Consults
 Renal consult     Name:  Vicki Buchanan                                             MRN:  [id number]   Admission Date:  08/20/2023                     Assessment/Plan:    Principal Problem:    Hyperbilirubinemia      CKDG3b/G4  - Baseline Cr 2.8-3.0  - UA with 2+ turbid, 2+ protein, 3+ LE  - UPCR 0.6  - CT abdomen obtained 3/11 showing mild bilateral renal atrophy, with left greater than right; no evidence of obstruction/hydronephrosis  - Suspected etiology of CKD: ATI related to dehydration, sepsis. Obstructive uropathy. Relative hypotension. Potential medication induced.   - Overall renal function stable at time of hospitalization     Dehydration (chronic)  - Presumed 2/2 ostomy output  - Bicarb stable 26, Na slightly below goal  - Receiving IVF PRN     Hypomagnesemia (chronic)  - 2/2 losses  - obtaining IV mag infusions as needed, most recent level 1.5    Anemia  - Hgb 6.9  - %sat 27, TIBC 222, Ferritin 1645    Biliary obstruction  - s/p ERCP x3, with failed stents  - T.bili 12.6, direct bili 17.2  - Planned ERCP 6/3    Recommendations:  - Regarding her renal function, she is close to her baseline; we should monitor this closely in the setting of hyperbilirubinemia given risk for bile cast nephropathy / pigment nephropathy  - Would continue with IVF, preferably LR (given her bicarbonate losses). I would work to [employer] her outputs with IVF  - Would check at least daily mag and phos, she often requires replacement  - Transfuse to goal Hgb >7.1  - her BP is elevated but likely appropriate given active pain, would work on pain control. If BP remains elevated in coming days we can consider adding coreg   - Continue her PO tums  - Appreciate GI and primary service in the care of this patient  - Dose meds for GFR ~15-16ml/min       Please page me if you have any questions.    Total Time Today was 60 minutes in the following activities: Preparing to see the patient, Obtaining and/or reviewing separately obtained history, Performing a medically appropriate examination and/or evaluation, Referring and communication with other health care professionals (when not separately reported), Documenting clinical information in the electronic or other health record, and Independently interpreting results (not separately reported) and communicating results to the patient/family/caregiver    _____________________________________________________________________________    History of Present Illness: Vicki Buchanan is a 68 y.o. female Pt reports she was visiting family in [place]  when she became ill. Presented to [facility] where it was discovered that she was experiencing jaundice. Imaging showed elevated t.bili. she underwent ERCP with metal then plastic stent. Subsequent revision here at [facility], which has not worked. Now reporting acute on chronic abdominal pain and elegated t.bili. Planned ERCP tomorrow.     Past Medical History:    Acid reflux    Allergy    Bundle branch block    Cancer (CMS-HCC)    Cancer of appendix (CMS-HCC)    Cancer of colon (CMS-HCC)    Pneumonia    Sarcoma (CMS-HCC)  Surgical History:   Procedure Laterality Date    CESAREAN SECTION  1981    HX CHOLECYSTECTOMY  1995    LAPAROSCOPY  02/2017    biopsy peritoneal    COLONOSCOPY DIAGNOSTIC WITH SPECIMEN COLLECTION BY BRUSHING/ WASHING - FLEXIBLE N/A 03/19/2017    Performed by Malissa Norleen LABOR, MD at [facility] ENDO    COLONOSCOPY WITH BIOPSY - FLEXIBLE  03/19/2017    Performed by Malissa Norleen LABOR, MD at [facility] ENDO    EXPLORATORY LAPAROTOMY, DIVERTING LOOP ILEOSTOMY N/A 04/03/2017    Performed by Al-Kasspooles, Mazin, MD at [facility] OR    INSERTION TUNNELED CENTRAL VENOUS CATHETER - AGE 12 YEARS AND OVER Left 04/23/2017    Performed by Al-Kasspooles, Mazin, MD at [facility] OR    CYSTOURETHROSCOPY WITH INDWELLING URETERAL STENT INSERTION Bilateral 04/09/2018    Performed by Erskin Lenis, MD at [facility] OR    CYSTOURETHROSCOPY WITH URETERAL CATHETERIZATION WITH/ WITHOUT IRRIGATION/ INSTILLATION/ URETEROPYELOGRAPHY Bilateral 04/09/2018    Performed by Erskin Lenis, MD at [facility] OR    CYSTOURETHROSCOPY WITH INDWELLING URETERAL STENT EXCHANGE (RIGHT 6 x 26cm / LEFT 6 x 28cm) Bilateral 08/08/2018    Performed by Erskin Lenis, MD at [facility] OR    RETROGRADE UROGRAPHY WITH/ WITHOUT KUB Bilateral 08/08/2018    Performed by Erskin Lenis, MD at [facility] OR    ENDOSCOPIC RETROGRADE CHOLANGIOPANCREATOGRAPHY WITH SPHINCTEROTOMY/ PAPILLOTOMY N/A 09/16/2021    Performed by Rogers Kipper, MD at [facility] ENDO    ESOPHAGOGASTRODUODENOSCOPY WITH ENDOSCOPIC ULTRASOUND EXAMINATION - FLEXIBLE  09/16/2021    Performed by Rogers Kipper, MD at [facility] ENDO    ENDOSCOPIC RETROGRADE CHOLANGIOPANCREATOGRAPHY WITH PLACEMENT ENDOSCOPIC STENT INTO BILIARY/ PANCREATIC DUCT - EACH STENT  09/16/2021    Performed by Rogers Kipper, MD at [facility] ENDO    COLONOSCOPY DIAGNOSTIC WITH SPECIMEN COLLECTION BY BRUSHING/ WASHING - FLEXIBLE N/A 09/26/2021    Performed by Rogers Kipper, MD at [facility] OR    ENDOSCOPIC RETROGRADE CHOLANGIOPANCREATOGRAPHY WITH PLACEMENT ENDOSCOPIC STENT INTO BILIARY/ PANCREATIC DUCT - EACH STENT  09/29/2021    Performed by Noralyn Blossom, MD at [facility] ENDO    ENDOSCOPIC RETROGRADE CHOLANGIOPANCREATOGRAPHY with plastic stent exchange N/A 10/27/2021    Performed by Noralyn Blossom, MD at [facility] ENDO    ENDOSCOPIC RETROGRADE CHOLANGIOPANCREATOGRAPHY WITH ABLATION TUMOR/ POLYP/ OTHER LESION  10/27/2021    Performed by Noralyn Blossom, MD at [facility] ENDO    ENDOSCOPIC RETROGRADE CHOLANGIOPANCREATOGRAPHY WITH REMOVAL CALCULI/ DEBRIS FROM BILIARY/ PANCREATIC DUCT  10/27/2021    Performed by Noralyn Blossom, MD at [facility] ENDO    ENDOSCOPIC RETROGRADE CHOLANGIOPANCREATOGRAPHY  plastic stent replacement N/A 12/22/2021    Performed by Noralyn Blossom, MD at [facility] ENDO    ENDOSCOPIC RETROGRADE CHOLANGIOPANCREATOGRAPHY N/A 03/30/2022    Performed by Noralyn Blossom, MD at [facility] ENDO    ENDOSCOPIC RETROGRADE CHOLANGIOPANCREATOGRAPHY WITH REMOVAL AND EXCHANGE OF STENT BILIARY/ PANCREATIC DUCT - EACH STENT EXCHANGED N/A 03/30/2022    Performed by Noralyn Blossom, MD at [facility] ENDO    ENDOSCOPIC RETROGRADE CHOLANGIOPANCREATOGRAPHY WITH REMOVAL AND EXCHANGE OF STENT BILIARY/ PANCREATIC DUCT - EACH STENT EXCHANGED N/A 07/27/2022    Performed by Noralyn Blossom, MD at [facility] ENDO    ENDOSCOPIC RETROGRADE CHOLANGIOPANCREATOGRAPHY N/A 11/02/2022    Performed by Noralyn Blossom, MD at [facility] ENDO    ENDOSCOPIC RETROGRADE CHOLANGIOPANCREATOGRAPHY WITH REMOVAL AND EXCHANGE OF STENT BILIARY/ PANCREATIC DUCT - EACH STENT EXCHANGED N/A 11/02/2022    Performed by Noralyn Blossom, MD at [facility] ENDO    ENDOSCOPIC RETROGRADE CHOLANGIOPANCREATOGRAPHY WITH REMOVAL  AND EXCHANGE OF STENT BILIARY/ PANCREATIC DUCT - EACH STENT EXCHANGED N/A 01/25/2023    Performed by Noralyn Blossom, MD at [facility] ENDO    ENDOSCOPIC RETROGRADE CHOLANGIOPANCREATOGRAPHY WITH REMOVAL AND EXCHANGE OF STENT BILIARY/ PANCREATIC DUCT - EACH STENT EXCHANGED N/A 04/26/2023    Performed by Noralyn Blossom, MD at [facility] ENDO    ENDOSCOPIC RETROGRADE CHOLANGIOPANCREATOGRAPHY WITH REMOVAL AND EXCHANGE OF STENT BILIARY/ PANCREATIC DUCT - EACH STENT EXCHANGED N/A 07/26/2023    Performed by Noralyn Blossom, MD at [facility] ENDO    ENDOSCOPIC RETROGRADE CHOLANGIOPANCREATOGRAPHY WITH REMOVAL CALCULI/ DEBRIS FROM BILIARY/ PANCREATIC DUCT N/A 08/16/2023    Performed by Noralyn Blossom, MD at [facility] ENDO    ENDOSCOPIC RETROGRADE CHOLANGIOPANCREATOGRAPHY WITH REMOVAL AND EXCHANGE OF STENT BILIARY/ PANCREATIC DUCT - EACH STENT EXCHANGED N/A 08/16/2023    Performed by Noralyn Blossom, MD at [facility] ENDO    ENDOSCOPIC RETROGRADE CHOLANGIOPANCREATOGRAPHY, WITH NEOPLASM OR POLYP ABLATION N/A 08/16/2023    Performed by Noralyn Blossom, MD at [facility] ENDO    COLON SURGERY  7/20    COLON SURGERY  7/20    HX APPENDECTOMY  1/10    Guess on date    HX CESAREAN SECTION  4/81    HX HYSTERECTOMY  7/20    HX LOWER ANTERIOR RESECTION OF COLON      7/20    HX SALPINGO-OOPHORECTOMY      ILEOSTOMY OR JEJUNOSTOMY Right     LIVER DONOR SURGERY 12/24/21    Liver stents    OVARY SURGERY      Removed 7/20    PORTACATH PLACEMENT  1/20    PR PNCRTECT PROX STOT W/PANCREATOJEJUNOSTOMY  7/20    STOMACH SURGERY      7/20    TUNNELED VENOUS PORT PLACEMENT Left 1/19    URETER STENT PLACEMENT Bilateral      Family History   Problem Relation Name Age of Onset    Diabetes Mother Murray     Cancer Father Lamar     Diabetes Brother Octaviano     Cancer-Lung Paternal Aunt Doris     Cancer-Breast Maternal Grandmother Helen     Diabetes Paternal Grandmother Annabelle      Social History     Socioeconomic History    Marital status: Married   Tobacco Use    Smoking status: Never    Smokeless tobacco: Never   Vaping Use    Vaping status: Never Used   Substance and Sexual Activity    Alcohol use: Not Currently     Comment: rarely    Drug use: Not Currently    Sexual activity: Yes     Partners: Male     Birth control/protection: Post-menopausal             Allergies:  Indomethacin  and Shrimp    Medications:    Current:  Scheduled Meds:[Held by Provider] heparin  (porcine) PF syringe 5,000 Units, 5,000 Units, Subcutaneous, Q8H  [START ON 08/21/2023] pantoprazole  DR (PROTONIX ) tablet 40 mg, 40 mg, Oral, QDAY before breakfast  phytonadione  (vitamin K1 ) 10 mg in dextrose  5% (D5) 50 mL IVPB, 10 mg, Intravenous, QDAY  piperacillin /tazobactam (ZOSYN ) 4.5 g in sodium chloride  0.9% (NS) 100 mL IVPB (MB+), 4.5 g, Intravenous, Q12H*    Continuous Infusions:   sodium chloride  0.9% infusion 75 mL/hr at 08/20/23 1843     PRN and Respiratory Meds:acetaminophen  Q6H PRN, alteplase  BID PRN, calcium  carbonate Q6H PRN, cetirizine  QDAY PRN, diphenoxylate -atropine  QID PRN, hydrALAZINE  Q6H PRN, hydrOXYzine  HCL Q6H PRN, loperamide  QID  PRN, melatonin QHS PRN, ondansetron  Q6H PRN **OR** ondansetron  (ZOFRAN ) IV Q6H PRN, polyethylene glycol 3350  QDAY PRN, sennosides-docusate sodium  QDAY PRN, traMADoL  Q4H PRN      Prior to admission:  Medications Prior to Admission   Medication Sig acetaminophen /lidocaine /antacid(#) (GI COCKTAIL) 1:1:1 oral suspension Take 30 mL by mouth every 4 hours as needed.    apap-maalox-lidocaine  1:3:1 oral suspension (MIXTURE COMPOUND) Take 30 mL by mouth every 4 hours as needed. (Patient taking differently: Take  by mouth every 4 hours as needed.)    calcium  carbonate (TUMS) 500 mg (200 mg elemental calcium ) chewable tablet Chew one tablet by mouth every 6 hours as needed.    carvediloL  (COREG ) 6.25 mg tablet Take one tablet by mouth twice daily. Take with food.  Hold if systolic blood pressure < 110    cetirizine  (ZYRTEC ) 10 mg tablet Take one tablet by mouth daily.    diphenoxylate -atropine  (LOMOTIL ) 2.5-0.025 mg tablet Take one tablet by mouth four times daily as needed for Diarrhea.    esomeprazole  DR (NEXIUM ) 20 mg capsule Take one capsule by mouth daily. Take on an empty stomach at least 1 hour before or 2 hours after food.    hydrOXYzine  HCL (ATARAX ) 25 mg tablet Take one tablet by mouth every 6 hours as needed for Itching.    levoFLOXacin  (LEVAQUIN ) 250 mg tablet Take three tablets by mouth daily for 1 day, THEN two tablets every 48 hours for 7 days. Take 3 tablets by mouth for 1 day then 48 hours later take 2 tablets every 48 hours for 7 days    loperamide  (IMODIUM  A-D) 2 mg capsule Take one pill by mouth four times daily as needed    magnesium  oxide 400 mg magnesium  tablet Take one tablet by mouth twice daily.    ondansetron  (ZOFRAN  ODT) 8 mg rapid dissolve tablet DISSOLVE 1 TABLET UNDER THE TONGUE EVERY 8 HOURS AS NEEDED FOR NAUSEA AND VOMITING    potassium chloride  (KLOR-CON  SPRINKLE) 10 mEq capsule Take one capsule by mouth twice daily. If potassium < 3.5. Take with a meal and a full glass of water .    sod chlor,sod bicarb/neti pot (SINUS WASH NETI POT SINI) Use as directed    traMADoL  (ULTRAM ) 50 mg tablet Take one tablet to two tablets by mouth daily as needed for Pain.       Review of Systems:  12 point review of systems was negative other than as indicated in the HPI.      Physical Exam:  Vital Signs: Last Filed In 24 Hours Vital Signs: 24 Hour Range   BP: 179/73 (06/02 1937)  Temp: 36.6 ?C (97.8 ?F) (06/02 1937)  Pulse: 67 (06/02 2054)  Respirations: 17 PER MINUTE (06/02 2054)  SpO2: 100 % (06/02 2054)  O2 Device: None (Room air) (06/02 2054) BP: (141-197)/(69-86)   Temp:  [36.2 ?C (97.2 ?F)-36.6 ?C (97.8 ?F)]   Pulse:  [67-80]   Respirations:  [16 PER MINUTE-17 PER MINUTE]   SpO2:  [96 %-100 %]   O2 Device: None (Room air)            Physical Exam  General - ill appearing, no acute distress  HEENT - NC/AT, scleral icterus  Chest - symmetric chest rise, effort normal  Abdomen - no protuberance or guarding  Skin - Warm, no rash or skin lesion noted. Jaundice in appearance.   Ext - No visable edema, cyanosis or clubbing  Neuro - AO x3, no asterixis, grossly non-focal  Psych -  Normal mood and behavior       Lab/Radiology/Other Diagnostic Tests:  Pertinent labs reviewed     Pertinent radiology reviewed.    Lorrene ONEIDA Fuelling, MD  Pager

## 2023-08-20 NOTE — Progress Notes
 RT Adult Assessment Note    NAME:Vicki Buchanan             MRN: [id number]             DOB:1955/09/21          AGE: 68 y.o.  ADMISSION DATE: 08/20/2023             DAYS ADMITTED: LOS: 0 days    Additional Comments:  Impressions of the patient: Patient not in distress on room air with abdominal procedure in the morning   Intervention(s)/outcome(s): See care plan       Vital Signs:  Pulse: 67  RR: 17 PER MINUTE  SpO2: 100 %  O2 Device: None (Room air)  Liter Flow:    O2%:      Breath Sounds:   Breath Sounds WDL: Exceptions to WDL  Right Apex Breath Sounds: Clear (Implies normal)  Right Base Breath Sounds: Decreased  Left Apex Breath Sounds: Clear (Implies normal)  Left Base Breath Sounds: Decreased  Respiratory Effort:   Respiratory WDL: Within Defined Limits  Comments:

## 2023-08-20 NOTE — Progress Notes
 Pt presents from exam where she was assessed by the provider to [facility] tx with complaints of jaundice, fatigue, and feeling unwell.  Scheduled treatment has been deferred and pt to be admitted to hospital. 1L NS started TRO 2hr. Cefepime give IVP over without issues. Pt denies need for anything else at this time.   1240- NS infusion complete. PAC flushed and left accessed for hospital admission. Pt dc'd from treatment area with spouse. They stated that they received a call saying the pt's room at the hospital is ready and they will head over for her to be admitted. Pt left in w/c.

## 2023-08-20 NOTE — Care Coordination-Inpatient
 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 S/p several ERCP and stent. Friday bili 17, Hgb 6.9, creatinine stable at 2.8. no fevers but very fatigued, decreased ostomy output and yellow vision. VSS

## 2023-08-20 NOTE — Patient Instructions
[facility]  Discharge Instructions  Cancer Center Phone # [phone] (Answered 24 hrs a day)      Discharge Instructions  Call immediately to report the following:  Uncontrolled nausea or vomiting, pain, or bleeding  Temperature of 100.5 F or greater or any sign/symptom of infection (warmth, redness, tenderness)  Painful mouth or difficulty swallowing  Diarrhea   Swelling of arms or legs  Rash     General Post-Treatment Directions:    Drink 8-10 glasses of fluids daily.  Try to exercise daily to decrease fatigue.  To prevent/soothe mouth sores use mouth rinses after meals and at bedtime.  Use a non-alcohol commercial brand rinse or mild salt water/baking soda rinse.

## 2023-08-21 ENCOUNTER — Encounter: Admit: 2023-08-21 | Discharge: 2023-08-21 | Payer: MEDICARE

## 2023-08-21 ENCOUNTER — Inpatient Hospital Stay: Admit: 2023-08-21 | Discharge: 2023-08-21 | Payer: MEDICARE

## 2023-08-21 LAB — ERCP

## 2023-08-21 LAB — CBC
~~LOC~~ BKR MCH: 31 pg (ref 26.0–34.0)
~~LOC~~ BKR MCHC: 34 g/dL (ref 32.0–36.0)
~~LOC~~ BKR MCV: 93 fL (ref 80.0–100.0)
~~LOC~~ BKR MPV: 8.6 fL (ref 7.0–11.0)
~~LOC~~ BKR PLATELET COUNT: 284 10*3/uL (ref 150–400)
~~LOC~~ BKR RDW: 21 % — ABNORMAL HIGH (ref 11.0–15.0)
~~LOC~~ BKR WBC COUNT: 18 10*3/uL — ABNORMAL HIGH (ref 4.50–11.00)

## 2023-08-21 LAB — CBC AND DIFF
~~LOC~~ BKR HEMATOCRIT: 23 % — ABNORMAL LOW (ref 36.0–45.0)
~~LOC~~ BKR MCHC: 34 g/dL — ABNORMAL HIGH (ref 32.0–36.0)
~~LOC~~ BKR RBC COUNT: 2.4 10*6/uL — ABNORMAL LOW (ref 4.00–5.00)
~~LOC~~ BKR RDW: 21 % — ABNORMAL HIGH (ref 11.0–15.0)
~~LOC~~ BKR WBC COUNT: 16 10*3/uL — ABNORMAL HIGH (ref 4.50–11.00)

## 2023-08-21 LAB — MANUAL DIFF
~~LOC~~ BKR EOSINOPHILS - RELATIVE: 1 % (ref 0–5)
~~LOC~~ BKR LYMPHOCYTES - RELATIVE: 7 % — ABNORMAL LOW (ref 24–44)
~~LOC~~ BKR MONOCYTES - RELATIVE: 3 % — ABNORMAL LOW (ref 4–12)
~~LOC~~ BKR NEUT+BANDS - ABSOLUTE: 15 10*3/uL — ABNORMAL HIGH (ref 1.8–7.0)
~~LOC~~ BKR NEUTROPHILS - RELATIVE: 89 % — ABNORMAL HIGH (ref 41–77)

## 2023-08-21 LAB — PERIPHERAL SMEAR

## 2023-08-21 LAB — 25-OH VITAMIN D (D2 + D3): ~~LOC~~ BKR VITAMIN D (25-OH) TOTAL: 11 ng/mL — ABNORMAL LOW (ref 30–80)

## 2023-08-21 LAB — COMPREHENSIVE METABOLIC PANEL
~~LOC~~ BKR ALK PHOSPHATASE: 441 U/L — ABNORMAL HIGH (ref 25–110)
~~LOC~~ BKR ALT: 70 U/L — ABNORMAL HIGH (ref 7–56)
~~LOC~~ BKR ANION GAP: 11 (ref 3–12)
~~LOC~~ BKR AST: 108 U/L — ABNORMAL HIGH (ref 7–40)
~~LOC~~ BKR CO2: 22 mmol/L (ref 21–30)
~~LOC~~ BKR GLOMERULAR FILTRATION RATE (GFR): 18 mL/min — ABNORMAL LOW (ref >60)
~~LOC~~ BKR POTASSIUM: 3.7 mmol/L — ABNORMAL LOW (ref 3.5–5.1)

## 2023-08-21 LAB — COAG PATH REVIEW

## 2023-08-21 LAB — LIPID PROFILE
~~LOC~~ BKR HDL: 6 mg/dL — ABNORMAL LOW (ref >40–400)
~~LOC~~ BKR LDL: 131 mg/dL — ABNORMAL HIGH (ref 0.2–<100)
~~LOC~~ BKR VLDL: 42 mg/dL — ABNORMAL LOW (ref 3.5–5.0)

## 2023-08-21 LAB — PHOSPHORUS: ~~LOC~~ BKR PHOSPHORUS: 3.2 mg/dL — ABNORMAL HIGH (ref 2.0–4.5)

## 2023-08-21 LAB — TSH WITH FREE T4 REFLEX: ~~LOC~~ BKR TSH: 2.7 [IU]/mL — ABNORMAL HIGH (ref 0.35–5.00)

## 2023-08-21 LAB — IRON + BINDING CAPACITY + %SAT+ FERRITIN
~~LOC~~ BKR % SATURATION: 27 % — ABNORMAL LOW (ref 28–42)
~~LOC~~ BKR FERRITIN: 164 ng/mL — ABNORMAL HIGH (ref 10–200)
~~LOC~~ BKR IRON BINDING: 222 ug/dL — ABNORMAL LOW (ref 270–380)
~~LOC~~ BKR IRON: 60 g/dL (ref 50–160)
~~LOC~~ BKR TRANSFERRIN: 149 mg/dL — ABNORMAL LOW (ref 185–336)

## 2023-08-21 LAB — PROTIME INR 1:1 MIX
~~LOC~~ BKR PT 1:1 MIX 0 MIN: 15 s — ABNORMAL HIGH (ref 9.9–14.2)
~~LOC~~ BKR PT 1:1 MIX 60 MIN: 15 s — ABNORMAL HIGH (ref 9.9–14.2)
~~LOC~~ BKR PT PAT 0 MIN: 63 s — ABNORMAL HIGH (ref 9.9–14.2)

## 2023-08-21 LAB — PROTIME INR (PT): ~~LOC~~ BKR PROTIME: 17 s — ABNORMAL HIGH (ref 9.9–14.2)

## 2023-08-21 LAB — CREATINE KINASE-CPK: ~~LOC~~ BKR CK TOTAL: 26 U/L (ref 21–215)

## 2023-08-21 MED ORDER — CARVEDILOL 6.25 MG PO TAB
6.25 mg | Freq: Two times a day (BID) | ORAL | 0 refills | Status: DC
Start: 2023-08-21 — End: 2023-08-22
  Administered 2023-08-21 – 2023-08-22 (×3): 6.25 mg via ORAL

## 2023-08-21 MED ORDER — HYDRALAZINE 10 MG PO TAB
20 mg | Freq: Three times a day (TID) | ORAL | 0 refills | Status: DC
Start: 2023-08-21 — End: 2023-08-22
  Administered 2023-08-21 – 2023-08-22 (×3): 20 mg via ORAL

## 2023-08-21 MED ORDER — HYDRALAZINE 10 MG PO TAB
10 mg | Freq: Three times a day (TID) | ORAL | 0 refills | Status: DC
Start: 2023-08-21 — End: 2023-08-21
  Administered 2023-08-21: 13:00:00 10 mg via ORAL

## 2023-08-21 MED ORDER — LABETALOL 5 MG/ML IV SOLN
10 mg | INTRAVENOUS | 0 refills | Status: DC | PRN
Start: 2023-08-21 — End: 2023-08-24
  Administered 2023-08-22 (×2): 10 mg via INTRAVENOUS

## 2023-08-21 MED ORDER — ERGOCALCIFEROL (VITAMIN D2) 1,250 MCG (50,000 UNIT) PO CAP
50000 [IU] | ORAL | 0 refills | Status: DC
Start: 2023-08-21 — End: 2023-09-08
  Administered 2023-08-22 – 2023-09-05 (×4): 50000 [IU] via ORAL

## 2023-08-21 MED ORDER — IOHEXOL 300 MG IODINE/ML IV SOLN
0 refills | Status: DC
Start: 2023-08-21 — End: 2023-08-21

## 2023-08-21 MED ORDER — HYDRALAZINE 25 MG PO TAB
25 mg | Freq: Three times a day (TID) | ORAL | 0 refills | Status: DC
Start: 2023-08-21 — End: 2023-08-21

## 2023-08-21 NOTE — Progress Notes
 Pharmacy High Risk Medication Review    A high risk medication review has been performed for Vicki Buchanan by a pharmacy team member.      Medication class(es) to review on inpatient med list:  Opioids and Anticholinergic medication (ex. diphenhydramine )      The medications class(es) are noted to be high risk to mobility or mentation in patients >69 years of age. If your patient has been CAM positive (diagnosed with delirium) at any point during this hospitalization then it would be particularly helpful to review these medications. Each medication has its own unique risks and benefits to the patient, we ask that these medications are reviewed and considered for de-prescribing where appropriate.     Consider reviewing acbcalc.com for anticholinergic burden calculation.  If you need further assistance identifying or de-prescribing, please contact pharmacy.      Current Inpatient Medications:  Scheduled Meds:carvediloL  (COREG ) tablet 6.25 mg, 6.25 mg, Oral, BID  cyanocobalamin  (vitamin B-12) (RUBRAMIN  PC) injection 1,000 mcg, 1,000 mcg, Intramuscular, QDAY  [Held by Provider] heparin  (porcine) PF syringe 5,000 Units, 5,000 Units, Subcutaneous, Q8H  hydrALAZINE  (APRESOLINE ) tablet 20 mg, 20 mg, Oral, TID  pantoprazole  DR (PROTONIX ) tablet 40 mg, 40 mg, Oral, QDAY before breakfast  phytonadione  (vitamin K1 ) 10 mg in dextrose  5% (D5) 50 mL IVPB, 10 mg, Intravenous, QDAY  piperacillin /tazobactam (ZOSYN ) 4.5 g in sodium chloride  0.9% (NS) 100 mL IVPB (MB+), 4.5 g, Intravenous, Q12H*    Continuous Infusions:  PRN and Respiratory Meds:acetaminophen  Q6H PRN, alteplase  BID PRN, calcium  carbonate Q6H PRN, cetirizine  QDAY PRN, diphenoxylate -atropine  QID PRN, hydrOXYzine  HCL Q6H PRN, labetalol  (NORMODYNE ; TRANDATE ) injection Q4H PRN, loperamide  QID PRN, melatonin QHS PRN, ondansetron  Q6H PRN **OR** ondansetron  (ZOFRAN ) IV Q6H PRN, polyethylene glycol 3350  QDAY PRN, sennosides-docusate sodium  QDAY PRN, traMADoL  Q4H PRN        Thank you,  Hildegard Boatman, [facility]  08/21/2023

## 2023-08-21 NOTE — Case Management (ED)
 Case Management Admission Assessment    NAME:Vicki Buchanan                          MRN: [id number]             DOB:01-27-1956          AGE: 68 y.o.  ADMISSION DATE: 08/20/2023             DAYS ADMITTED: LOS: 1 day      Today?s Date: 08/21/2023    Source of Information: pt and EMR       Plan  Plan: Case Management Assessment  This CM met with pt for assessment on this date.  Provided contact information and explanation of SW/NCM roles.  Reviewed [facility], Preparing for Discharge, and [facility] hand-outs.  Provided opportunity for questions and discussion. Pt/family encouraged to contact Case Management team with questions and concerns during hospitalization and until patient is able to transition back to the patient's primary care physician.  Patient lives with her husband [name] in a 2 level home in [place]. Pt is independent with ADLs, stating her husband helps with housework and pt denied concerns with mobility.   Denied hx of DME/SNF/IPR.   Pt has had HH in the past with [facility], home infusion through [facility] in the past, and has been on TPN in the past. Pt currently receives IV fluids 2x per week at home administered by her husband [name] and has her labs drawn at [facility].   At time of dc, patient's husband can provide transportation.   NCM to continue to follow for discharge planning.     Patient Address/Phone  [address]  [place] [address]  [phone] (home)     Emergency Contact  Extended Emergency Contact Information  Primary Emergency Contact: Reppucci,Dennis  Address: [address]           [place] [address] [place]   Home Phone: [phone]  Mobile Phone: [phone]  Relation: Spouse  Secondary Emergency Contact: Mesina,KATIE   [place]   Home Phone: [phone]  Mobile Phone: [phone]  Relation: Daughter    Healthcare Directive  Healthcare Directive: Yes, patient has a healthcare directive  Type of Healthcare Directive: Durable power of attorney for healthcare  Location of Healthcare Directive: Current and verified in document scanning system  Would patient like to fill out a (a new) Editor, commissioning?: No, patient declined      Transportation  Does the Patient Need Case Management to Arrange Discharge Transport? (ex: facility, ambulance, wheelchair/stretcher, Medicaid, cab, other): No  Will the Patient Use Family Transport?: Yes  Transportation Name, Phone and Availability #1: [place] [name] [phone]    Expected Discharge Date  08/22/2023     Living Situation Prior to Admission  Living Arrangements  Type of Residence: Home, independent  Living Arrangements: Spouse/significant other  Financial risk analyst / Tub: Tub/Shower Unit, Psychologist, counselling  How many levels in the residence?: 2  Can patient live on one level if needed?: No  Does residence have entry and/or inside stairs?: Yes (2 STE, 12 stairs to second floor)  Assistance needed prior to admit or anticipated on discharge: No  Who provides assistance or could if needed?: Husband  Are they in good health?: Yes  Level of Function   Prior level of function: Independent  Cognitive Abilities   Cognitive Abilities: Alert and Oriented, Engages in problem solving and planning, Participates in decision making  Financial Resources  Coverage  Primary Insurance: Medicare  Secondary Insurance: Nurse, learning disability  Additional Coverage: None  Medication Coverage    Medication Coverage: Commercial insurance  Have you experienced a noticeable increase in your copay costs recently?: No  Are current medications affordable?: Yes  Do You Use a Co-Pay Card or a Medication Assistance Program to Help Manage Medication Costs?: No  Do You Manage Your Own Medications?: Yes  Source of Income   Source Of Income: SSI  Financial Assistance Needed?  Medications are affordable at this time.     Psychosocial Needs  Mental Health  Mental Health History: No  Substance Use History  Substance Use History Screen: No  Other  N/a    Current/Previous Services  PCP  Venson Consuelo [facility]OR, [phone], [phone]  Patient confirmed they are current with the above PCP.   Pharmacy    [facility] [address] [place] - [address]  [address]  [place] [address]  Phone: [phone] Fax: [phone]    [facility]  [address]  [place] [address]  Phone: [phone] Fax: [phone]    [facility]  [address]  [place] [address]  Phone: [phone] Fax: [phone]    [id number] - [address] [place] - [address] AT [facility] [address] [facility] [address]  [address]  [place] [address]  Phone: [phone] Fax: [phone]    Durable Medical Equipment   Durable Medical Equipment at home: None  Home Health  Receiving home health: In the past  Agency name: [facility]  Hemodialysis or Peritoneal Dialysis  Undergoing hemodialysis or peritoneal dialysis: No  Tube/Enteral Feeds  Receive tube/enteral feeds: In the past (Pt stated she was on TPN in the past)  Infusion  Receive infusions: In the past  Infusion company: Therapist, art duty help used: No  Home and [employer] and community based services: No  Ryan White  Ryan White: N/A  Hospice  Hospice: No  Outpatient Therapy  PT: No  OT: No  SLP: No  Skilled Nursing Facility/Nursing Home  SNF: No  NH: No  Inpatient Rehab  IPR: No  Long-Term Acute Care Hospital  LTACH: No  Acute Hospital Stay  Acute Hospital Stay: In the past  Was patient's stay within the last 30 days?: No      Rhoda Jasmine, BSN, RN   Nurse Case Manager   [phone]  Available on Voalte

## 2023-08-21 NOTE — Progress Notes
 EGD/Upper EUS/ERCP                                Post Upper Endoscopy Instructions    -You may have a sore throat after the procedure for 2-3 days.  Try sucrets or lozenges to help ease the pain.  If it continues please contact us.    -If you feel feverish, have a temperature of 101 degrees or higher, persistent nausea and vomiting, abdominal pain or dark stools; please notify your nurse or GI physician.    -You may have abdominal cramping following the procedure this can be relieved by belching or passing air.    -If you have redness or swelling at the IV site, place a warm, wet washcloth over the affected areas for 15 minutes, 3-4 times a day until the redness subsides.  If symptoms continue for 2-3 days, contact your regular physician.    - If you have bleeding from your mouth, over 2 tablespoons and increasing, please notify your physician.  A small amount of bleeding is normal if a biopsy or polyps were taken.  If you are vomiting blood you need to seek immediate medical attention.    - You may resume all your routine medications, if medications need to be held your physician and/or nurse will notify you post procedure.    SPECIFIC INSTRUCTIONS  INPATIENTS:  Ask for help when you get up in your room, as you may still be drowsy from your sedation.

## 2023-08-21 NOTE — Progress Notes
 Inpatient Progress Note        Name:  Vicki Buchanan   Medical Record Number: [id number]          Account Number:  [id number]  Date Of Birth:  1955/11/23                           Room and Bed Number -  [id number]/[id number]  Today's Date:  08/21/2023  LOS: 1 day    Age: 68 years  female    Chief Complaint: Jaundice on 08/20/2023    Admitting diagnosis: Cholangitis (CMS-HCC)    Assessment     Principal Problem:    Cholangitis (CMS-HCC)  Active Problems:    Peritoneal carcinomatosis (CMS-HCC)    Cancer of appendix (CMS-HCC)    Hyperbilirubinemia      68 y.o.   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  - No fever or abdominal pain prior to admission  - WBC 20.2. Denies steroids use. Denies fever.   -Total bilirubin 15.8 on 5/27 25-17.2 on 08/20/2023.  Beside abx - denies tylenol   or other new medications  - According to OSH discharge summary:  ERCP 5/19 demonstrated indwelling uncovered metal biliary stent in the left hepatic duct which was exchanged with plastic stent, mild stenosis within the metal stent at the proximal main bile duct, diffuse malignant stenoses of intrahepatic ducts; presence of pus consistent with ascending cholangitis.                 > Patient noted w/ pus on 08/06/23 ERCP at [facility] and has been on augmentin  and ucrrently levaquin  for cholangitits. She states hs edoes not have fever during this course. No tylenol  use. Beside abx no other hcnages in medication. SHe does endorsed some LUQ abdominal discomfort.                  > Consultant: GI biliary.  plan for ERCP June 3                > Follow-up on blood cultures                > Start Zosyn  (6/2 - ) pending blood cultures. Patient was   taking levaquin  on admission   > Continue PTA Atarax  as needed for itching (rarely take them), tramadol  PRN for pain                > IVF to be started after blood. Patient does not want another PIV to be placed. She has port.  Pateint is receiving IVF intermittently at home and outpatient.                >  Continue PTA lomotil , immodium prn. Patient states she still has output through her ostomy                > Continue outpatient f/u with oncology. Last cehmo ~ 2 months PTA. She reports plan to take a break then possible monthly treatment  - s/p vit K  - ERCP June 3.                                 Limited views of the esophagus were                                 unremarkable. There was large amount of                                 retained food debris seen in the stomach.                                 Patient had to be turned to the left lateral                                 position to pass the scope through the pylorus                                 into the duodenum. The uncovered metal stent                                 was seen coming out of the ampulla. There was a                                 double-pigtail plastic stent going through the                                 lumen of the stent. This was removed with the                                 help of a snare. The lumen of the metal biliary                                 stent was then cannulated with stone extraction                                 balloon and a guidewire was passed that went                                 into the left intrahepatic duct. We first  performed balloon sweep through the lumen of                                 the stent with extraction large amount of                                 sludge, food debris and stent debris. Following                                 this we performed an occlusion cholangiogram.                                 There was some narrowing seen in the of the                                 stent in the mid portion consistent with                                 tumor/tissue ingrowth. The right hepatic duct                                 was opacified and appeared mildly dilated                                 without any stricture. The left hepatic duct                                 was opacified with difficulty. The left main                                 trunk appeared normal. The left intrahepatic                                 ducts were slender and were not visualized                                 adequately. We then inserted a 10 mm x 8 cm                                 VIABIL stent and deployed it within the                                 previously placed uncovered biliary stent. The                                 upper end of the VIABIL stent was below the  hilar confluence. There was good outflow of                                 contrast after stent deployment. Acquisition                                 interpretation of fluoroscopic images of the                                 bile duct was performed.      Acute on chronic normocytic anemia  History of GERD  - Hemoglobin 6.9, platelet normal 338. Hemoglobin basleine ~ 8-9  - Patient received blood during her [place] ~ 08[place]06/23  - Denies hematemesis, melena, hematochezia through ostomy                > Obtain iron  studi,s B12/folate, retic, smear. : Anisopoikilocytosis with prominent target cell population and mildly increased schistocytes (1/100 X)   Mildly left shifted granulopoiesis and toxic changes                 > Transfused 1 unit of pRBC and repeat CBC afterward. Discussed the risks/benefits  of blood transfusion including   allergic reactions, infections such as HIV, hepatitis B, C and etc. , respiratory failure and other as listed on blood consent.  Patient signed the electronic blood consent on admission encounter.                 > Continue PTA PPI     CKD stage 4  Chronic mild  left hydronephrosis  -BUN 43, creatinine 2.87.  Creatinine sinc 05/2022 had been around mid 2.5-2.8  -  OSH CT abdomen/pelvis on 08/14/23: 1.  Somewhat limited evaluation the absence of IV contrast. Extensive metastatic disease throughout the abdomen and pelvis including peritoneal implants with mass effect on adjacent structures. Left posterior pleural implant with invasion of the chest wall and adjacent smaller nodules.  2.  Common bile duct and biliary stent in place. Moderate to intrahepatic biliary ductal dilatation. 3.  Right lower quadrant ostomy encased with implants. No gas-filled dilated loops of bowel to suggest obstruction. 4.  Mild left hydronephrosis with prominent left extrarenal pelvis. Findings may be chronic versus secondary to extrinsic compression from peritoneal disease. 5.  Diffuse bladder wall thickening with adjacent fat stranding, possibly secondary to acute cystitis. Correlation with urinalysis is recommended. 6.  Distended vaginal cuff with air-fluid level. The posterior wall of the urinary bladder is adjacent to the vaginal cuff without preserved fat plane of separation.                > Patient states she has appointment w/ Dr. Lorrene on 08/21/23 and was told by outpatient team to consult Dr. Lorrene since she will hospitalized.                  > Consult nephrology (Dr. Lorrene) if available                > Continue to monitor urine output.   Of note patient used to be on macrobid  --> cipro  for ?UTI ppx however has been off since recent abx course.      Hypertensive urgency  - Stop IV fluid given CKD  -  Encourage p.o. intake  - Hydralazine , can titrate as tolerated.  Carvedilol  with holding parameters  - Monitor volume status closely    Replace vitamin D  and B12 deficiencies     DIET CARDIAC(LOW FAT/LOW SODIUM)   records from previous encounters have been reviewed.  Radiology, imaging, labs were reviewed and summarized as above.    Results, diagnoses, prognoses were reviewed with the patient   Benefits / risks were explained in details  pt was educated, informed about the importance of compliance and instructed to follow up with the PCP,     discussed with RN, as well as pharmacy, and SW in multidisciplinary round    PT/ OT      Total floor/unit time (reviewing and writing notes, examining the patient, reviewing test results etc) spent was 65 minutes including time spent in care coordination  and bedside eval      This note  was created using  [employer], hence  some grammatical errors may still be present despite  editing at the time of the dictation, )    Subjective:      Pt seen and Chart reviewed.   No issue overnight.  Patient was seen prior to her procedure.  Pleasant.  Husband was at bedside.  Patient reported no pain or fever prior to coming to the hospital.  She did notice darkening of urine.  No change noted regarding her jaundice.    ROS:      A comprehensive 10-point ROS was negative except as above    Medications:    Scheduled Meds:carvediloL  (COREG ) tablet 6.25 mg, 6.25 mg, Oral, BID  cyanocobalamin  (vitamin B-12) (RUBRAMIN  PC) injection 1,000 mcg, 1,000 mcg, Intramuscular, QDAY  [START ON 08/22/2023] ERGOcalciferoL  (vitamin D2) (DRISDOL ) capsule 50,000 Units, 50,000 Units, Oral, Q7 Days  [Held by Provider] heparin  (porcine) PF syringe 5,000 Units, 5,000 Units, Subcutaneous, Q8H  hydrALAZINE  (APRESOLINE ) tablet 20 mg, 20 mg, Oral, TID  pantoprazole  DR (PROTONIX ) tablet 40 mg, 40 mg, Oral, QDAY before breakfast  phytonadione  (vitamin K1 ) 10 mg in dextrose  5% (D5) 50 mL IVPB, 10 mg, Intravenous, QDAY  piperacillin /tazobactam (ZOSYN ) 4.5 g in sodium chloride  0.9% (NS) 100 mL IVPB (MB+), 4.5 g, Intravenous, Q12H*    Continuous Infusions:      PRN and Respiratory Meds:acetaminophen  Q6H PRN, alteplase  BID PRN, calcium  carbonate Q6H PRN, cetirizine  QDAY PRN, diphenoxylate -atropine  QID PRN, hydrOXYzine  HCL Q6H PRN, labetalol  (NORMODYNE ; TRANDATE ) injection Q4H PRN, loperamide  QID PRN, melatonin QHS PRN, ondansetron  Q6H PRN **OR** ondansetron  (ZOFRAN ) IV Q6H PRN, polyethylene glycol 3350  QDAY PRN, sennosides-docusate sodium  QDAY PRN, traMADoL  Q4H PRN      Objective:                            Vital Signs: Last Filed                 Vital Signs: 24 Hour Range   BP: 163/83 (06/03 1226)  Temp: 36.4 ?C (97.6 ?F) (06/03 1158)  Pulse: 66 (06/03 1158)  Respirations: 15 PER MINUTE (06/03 1158)  SpO2: 100 % (06/03 1158)  O2 Device: None (Room air) (06/03 1158) BP: (124-197)/(69-106)   Temp:  [36.3 ?C (97.3 ?F)-36.7 ?C (98.1 ?F)]   Pulse:  [64-79]   Respirations:  [12 PER MINUTE-21 PER MINUTE]   SpO2:  [96 %-100 %]   O2 Device: None (Room air)     Vitals:    08/20/23 2319   Weight: 51.1 kg (  112 lb 9.6 oz)         Intake/Output Summary:  (Last 24 hours)    Intake/Output Summary (Last 24 hours) at 08/21/2023 1358  Last data filed at 08/20/2023 1748  Gross per 24 hour   Intake 335.83 ml   Output --   Net 335.83 ml           Physical Exam    General: Patient seems to be clinically stable and in no immediate distress. AAO x3 .  Jaundiced/brownish  HEENT: NC/AT  Heart: S1 S2 present, RRR, no audible rubs or murmurs heard  Lungs: Bilateral air entry noted, normal breath sounds and no additional sounds heard  Abdomen: Soft, non tender and no masses noted. Bowel sounds were present .  Ileostomy in place  Extremities: no pedal edema; no calf tenderness  Skin: No obvious skin rashes   Neurologic: Seems oriented. No obvious abnormality of cranial nerves, motor system noted      Labs reviwed, Notable for:        Recent Labs     08/20/23  0741 08/21/23  0349   NA 136* 139   K 3.8 3.7   CL 101 106   CO2 26 22   GAP 9 11   BUN 43* 42*   CR 2.87* 2.75*   GLU 96 106*   CA 9.0 8.3*   ALBUMIN  3.0* 2.6*   MG 1.6 1.6   PO4  --  3.2   HGBA1C  --  5.5   TSH  --  2.78       Recent Labs     08/20/23  0741 08/20/23  1447 08/20/23  1657 08/20/23  2200 08/21/23  0349   WBC 20.20*  --   --  18.40* 16.80*   HGB 6.9*  --   --  8.3* 7.9*   HCT 20.0*  --   --  24.3* 23.0*   PLTCT 338  --   --  284 288   PT  --  62.7* 64.8*  --  17.4*   INR  --  5.8* 6.0*  --  1.5*   PTT  --  43.9*  --   --  33.8   AST 146*  --   --   --  108*   ALT 97*  --   --   --  70*   ALKPHOS 567*  --   --   --  441*      Estimated Creatinine Clearance: 15.8 mL/min (A) (based on SCr of 2.75 mg/dL (H)).  Vitals:    08/20/23 2319   Weight: 51.1 kg (112 lb 9.6 oz)         Resulted Micro Last 72 Hrs    No results found         Malnutrition Details:                                                     Active Wounds

## 2023-08-22 ENCOUNTER — Inpatient Hospital Stay: Admit: 2023-08-22 | Discharge: 2023-08-22 | Payer: MEDICARE

## 2023-08-22 LAB — 2D + DOPPLER ECHO
AORTIC VALVE STROKE VOLUME INDEX: 55
AV INDEX (NATIVE): 0.8
AV PEAK VELOCITY: 1.4 m/s
BSA: 1.5 m2
DOP CALC LVOT AREA: 3.1 cm2
DOP CALC LVOT DIAMETER: 2 cm
DOP CALC LVOT PEAK VEL VTI: 26 cm
DOP CALC LVOT PEAK VEL: 1.2 m/s
DOP CALC LVOT STROKE VOLUME: 82 cm3
E WAVE DECELARTION TIME: 215 ms
E/A RATIO: 0.8
EJECTION FRACTION: 60 %
FRACTIONAL SHORTENING: 35 % (ref 28–44)
LATERAL E/E' RATIO: 7.1
LEFT ATRIUM INDEX: 23 mL/m2 (ref 16–34)
LEFT ATRIUM SIZE: 4 cm (ref 2.7–3.8)
LEFT ATRIUM VOLUME: 34 mL (ref 22–52)
LEFT INTERNAL DIMENSION IN SYSTOLE: 2.9 cm (ref 2.2–3.5)
LEFT VENTRICLE DIASTOLIC VOLUME INDEX: 54 mL/m2 (ref 29–61)
LEFT VENTRICLE DIASTOLIC VOLUME: 81 mL (ref 46–106)
LEFT VENTRICLE MASS INDEX: 82 g/m2 (ref 43–95)
LEFT VENTRICLE SYSTOLIC VOLUME INDEX: 21 mL/m2 (ref 8–24)
LEFT VENTRICLE SYSTOLIC VOLUME: 32 mL (ref 14–42)
LEFT VENTRICULAR INTERNAL DIMENSION IN DIASTOLE: 4.5 cm (ref 3.8–5.2)
LEFT VENTRICULAR MASS: 123 g (ref 67–162)
MEDIAL E/E' RATIO: 11
MR PISA EROA: 0 cm2
MV PEAK A VEL: 0.9 m/s
MV PEAK E VEL PW: 0.7 m/s
PISA MRMAX VEL: 5.3 m/s
PISA RADIUS: 0.4 cm
PISA VN NYQUIST: 0.3 m/s
POSTERIOR WALL: 0.8 cm (ref 0.6–0.9)
RELATIVE WALL THICKNESS: 0.3 (ref <=0.42)
RIGHT ATRIAL AREA: 7.3 cm2 (ref <18)
RIGHT HEART SYSTOLIC MMODE TAPSE: 1.6 cm (ref >1.7)
RIGHT HEART SYSTOLIC TDI S': 0.1 m/s
RIGHT VENTRICULAR BASAL DIAMETER: 2.2 cm (ref 2.5–4.1)
RIGHT VENTRICULAR MID DIAMETER: 1.7 cm (ref 1.9–3.5)
RV SYSTOLIC PRESSURE: 21
SIMPSONS BIPLANE EF: 61 %
SINUS: 3.1 cm (ref 2.4–3.6)
TDI LATERAL E': 0.1 m/s
TDI MEDIAL E': 0 m/s
TR PEAK VELOCITY: 2.3 m/s

## 2023-08-22 LAB — NT-PRO-BNP: ~~LOC~~ BKR NT-PRO-BNP: 285 pg/mL — ABNORMAL HIGH (ref <125)

## 2023-08-22 LAB — PROTIME INR (PT): ~~LOC~~ BKR PROTIME: 14 s — ABNORMAL LOW (ref 9.9–14.2)

## 2023-08-22 LAB — CBC AND DIFF
~~LOC~~ BKR RBC COUNT: 2.6 10*6/uL — ABNORMAL LOW (ref 4.00–5.00)
~~LOC~~ BKR WBC COUNT: 18 10*3/uL — ABNORMAL HIGH (ref 4.50–11.00)

## 2023-08-22 LAB — HIGH SENSITIVITY TROPONIN I, RANDOM: ~~LOC~~ BKR HIGH SENSITIVITY TROPONIN I: 24 ng/L — ABNORMAL HIGH (ref 0.6–<15)

## 2023-08-22 LAB — MANUAL DIFF
~~LOC~~ BKR EOSINOPHILS - RELATIVE: 3 % (ref 0–5)
~~LOC~~ BKR LYMPHOCYTES - RELATIVE: 3 % — ABNORMAL LOW (ref 24–44)
~~LOC~~ BKR MONOCYTES - RELATIVE: 4 % (ref 4–12)
~~LOC~~ BKR NEUT+BANDS - ABSOLUTE: 16 10*3/uL — ABNORMAL HIGH (ref 1.8–7.0)
~~LOC~~ BKR NEUTROPHILS - RELATIVE: 90 % — ABNORMAL HIGH (ref 41–77)
~~LOC~~ BKR NUCLEATED RBC MANUAL: 4 10*3/uL

## 2023-08-22 LAB — COMPREHENSIVE METABOLIC PANEL: ~~LOC~~ BKR POTASSIUM: 3.5 mmol/L — ABNORMAL LOW (ref 3.5–5.1)

## 2023-08-22 LAB — PHOSPHORUS: ~~LOC~~ BKR PHOSPHORUS: 2.9 mg/dL — ABNORMAL HIGH (ref 2.0–4.5)

## 2023-08-22 LAB — LIPASE: ~~LOC~~ BKR LIPASE: >30 U/L — ABNORMAL HIGH (ref 11–82)

## 2023-08-22 MED ORDER — LACTATED RINGERS IV SOLP
INTRAVENOUS | 0 refills | Status: DC
Start: 2023-08-22 — End: 2023-08-24
  Administered 2023-08-22 – 2023-08-24 (×5): 1000.0000 mL via INTRAVENOUS

## 2023-08-22 MED ORDER — LIDOCAINE HCL 2 % MM SOLN
15 mL | Freq: Once | ORAL | 0 refills | Status: CP
Start: 2023-08-22 — End: ?
  Administered 2023-08-22: 10:00:00 15 mL via ORAL

## 2023-08-22 MED ORDER — CARVEDILOL 12.5 MG PO TAB
12.5 mg | Freq: Two times a day (BID) | ORAL | 0 refills | Status: DC
Start: 2023-08-22 — End: 2023-08-23
  Administered 2023-08-23: 01:00:00 12.5 mg via ORAL

## 2023-08-22 MED ORDER — SODIUM CHLORIDE 0.9 % IJ SOLN
10 mL | Freq: Once | INTRAVENOUS | 0 refills | Status: DC
Start: 2023-08-22 — End: 2023-08-23

## 2023-08-22 MED ORDER — OXYCODONE 5 MG PO TAB
5 mg | ORAL | 0 refills | Status: DC | PRN
Start: 2023-08-22 — End: 2023-09-08
  Administered 2023-08-22 – 2023-09-08 (×12): 5 mg via ORAL

## 2023-08-22 MED ORDER — TRIMETHOBENZAMIDE 100 MG/ML IM SOLN
200 mg | INTRAMUSCULAR | 0 refills | Status: DC | PRN
Start: 2023-08-22 — End: 2023-09-09
  Administered 2023-08-22 – 2023-08-30 (×2): 200 mg via INTRAMUSCULAR

## 2023-08-22 MED ORDER — PERFLUTREN LIPID MICROSPHERES 1.1 MG/ML IV SUSP
1-10 mL | Freq: Once | INTRAVENOUS | 0 refills | Status: AC | PRN
Start: 2023-08-22 — End: ?

## 2023-08-22 MED ORDER — MAGNESIUM SULFATE IN D5W 1 GRAM/100 ML IV PGBK
1 g | INTRAVENOUS | 0 refills | Status: CP
Start: 2023-08-22 — End: ?
  Administered 2023-08-22: 21:00:00 1 g via INTRAVENOUS

## 2023-08-22 MED ORDER — HYDRALAZINE 25 MG PO TAB
25 mg | Freq: Three times a day (TID) | ORAL | 0 refills | Status: DC
Start: 2023-08-22 — End: 2023-08-23
  Administered 2023-08-22 – 2023-08-23 (×2): 25 mg via ORAL

## 2023-08-22 MED ORDER — FENTANYL CITRATE (PF) 50 MCG/ML IJ SOLN
25 ug | INTRAVENOUS | 0 refills | Status: DC | PRN
Start: 2023-08-22 — End: 2023-09-08
  Administered 2023-08-22 – 2023-09-08 (×5): 25 ug via INTRAVENOUS

## 2023-08-22 MED ORDER — ALUM-MAG HYDROXIDE-SIMETH 200-200-20 MG/5 ML PO SUSP
30 mL | Freq: Once | ORAL | 0 refills | Status: CP
Start: 2023-08-22 — End: ?
  Administered 2023-08-22: 10:00:00 30 mL via ORAL

## 2023-08-22 MED ORDER — PANTOPRAZOLE 40 MG IV SOLR
40 mg | Freq: Every day | INTRAVENOUS | 0 refills | Status: DC
Start: 2023-08-22 — End: 2023-08-28
  Administered 2023-08-23 – 2023-08-28 (×7): 40 mg via INTRAVENOUS

## 2023-08-22 NOTE — Consults
 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 without evidence of ascending cholangitis  Pancreatitis, chemical post ERCP  08/03/2023 admitted to [facility] with acute cholangitis  Started Zosyn   08/04/2023 urine culture: >100K CFU Enterococcus faecalis (S) ampicillin   08/06/2023 underwent ERCP which revealed indwelling uncovered metal biliary stent in the left hepatic duct which was exchanged with plastic stent, mild stenosis within the metal stent at the proximal main bile duct, diffuse malignant stenoses of intrahepatic ducts; presence of purulence  08/07/2023 discontinued Zosyn ; started Augmentin ; discharged home  08/16/2023 underwent ERCP which revealed a protruding indwelling metallic stent in CBD seen coming out from ampulla with a plastic stent inside the metal stent, plastic stent was removed, occlusion cholangiogram: right posterior intrahepatic duct opacified; left hepatic system not opacified and totally; right anterior intrahepatic with branches partially opacified with significant narrowing beyond indwelling stent up to the hilum of liver; palced a Habiba catheter size 8 French, 2.7 and RFA x 2; large amount of tumor ingrowth inside the indwelling stent with large amount of tissue growing at the entrance of the stent which again this area also tumor destruction applied using RFA x 2; placed a 10 French, 10 cm double-pigtail plastic stent was successfully placed into the right intrahepatic duct; good outflow of bile and contrast seen through both the stents  08/20/2023 seen in oncology clinic [facility], noted to have persistent jaundice status post ERP and stent placement, received cefepime  x 1 dose  Directly admitted to the hospitalist service  Blood cultures: No growth x 2 sets  Urine culture: No growth  Started Zosyn   Underwent ERCP which revealed uncovered metal stent in CBD extending out of ampulla with a double-pigtail plastic stent through the lumen of the uncovered metal stent, double-pigtail plastic stent removed, stone extraction balloon passed into left intrahepatic duct, balloon sweeps with extraction of a large amount of sludge, food debris and stent debris; occlusion cholangiogram velum narrowing of the stent midportion consistent with tumor ingrowth; palced 10 mm x 8 cm VIABIL stent within the previously placed uncovered biliary stent, upper end of the VIABIL stent was below the hilar confluence, good outflow of contrast after stent deployment     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 with subsequent removal of ureteral stents  02/2017 diagnosed on laparoscopy  04/03/2017 underwent loop ileostomy creation  12/2017 started epacadosta and sirolimus   05/2018 discontinued epacadosta and sirolimus   05/27/2020 underwent laparoscopic HIPEC with mitomycin-C (trial at [facility])  10/07/2018 underwent second laparoscopic HIPEC  09/12/2018 underwent tumor debulking [facility])   05/2020 underwent radiation therapy to right lung metastases  04/16/2020 underwent cytoreductive surgery with ostomy revision due to tumor invasion  09/2020 started FOLFIRI and bevacizumab    09/07/2021 MRI/MRCP: Slight apparent increase in diffuse intrahepatic and extrahepatic biliary ductal dilatation to the level of the porta hepatis where there is   extrinsic compression on the extrahepatic common duct from metastases. No   biliary filling defect to suggest choledocholithiasis or biliary mass. Grossly unchanged extensive peritoneal carcinomatosis and right abdominal wall metastases. Partially visualized indeterminate lesion in the left sacrum, which may   be a metastasis or hemangioma. 09/16/2021 underwent ERCP this month of an uncovered metal  stent in the CBD  09/2021 held bevacizumab    08/04/2023 CT ABD/pelvis: Extensive metastatic disease throughout the abdomen and pelvis including peritoneal implants with mass effect on adjacent structures. Left posterior pleural implant with invasion of the chest wall and adjacent smaller nodules. Common bile duct and biliary stent in place. Moderate to intrahepatic biliary ductal dilatation. Right lower quadrant ostomy encased with implants. No gas-filled dilated loops of bowel to suggest obstruction. Mild left hydronephrosis with prominent left extrarenal pelvis. Diffuse bladder wall thickening with adjacent fat stranding, possibly secondary to acute cystitis. Correlation with urinalysis is recommended. Distended vaginal cuff with air-fluid level. The posterior wall of the urinary bladder is adjacent to the vaginal cuff without preserved fat plane of separation.   08/20/2023 CEA 2565.5  Currently FOLFIRI cycle 40    CKD, stage IV    Recurrent urinary tract infections    Pelvic abscess following tumor debulking  10/2018 abscess cultures: Serratia and Candida albicans  Underwent IR abscess drain placement  Received course of cefepime  and fluconazole     Asplenia, surgical  02/2018 Pneumovax   09/2018 PCV13 (Prevnar)   09/2018 Haemophilus influenzae type B   Meningococcal conjugate vaccine (Menveo) 09/30/2018 and 11/2018  Meningococcal serogroup B vaccine (MenB) 09/30/2018 and 10/28/2018      Recommendations:   Continue Zosyn  4.5 g IV every 12 hours.  Watch for antimicrobial toxicities.  Await blood cultures.  Await urine culture.  CT ABD/pelvis without contrast to assess for tumor burden and pancreatitis.  Reengage gastroenterology regarding pancreatitis and slow trend in bilirubin.  Consult medical oncology given persistent rise in CEA.  Discussed with the patient and her husband at the bedside.  All questions answered to their understanding.  Prognosis is guarded.  We will continue to follow with you.    Thank you for the consultation.     Adina Bouche, DO  Associate Professor  Division of Infectious Diseases    History of Present Illness    Vicki Buchanan is a 68 y.o. 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    See detailed history above.      Antimicrobial Start date End date                                        Past Medical History     Past Medical History:    Acid reflux    Allergy    Bundle branch block    Cancer (CMS-HCC)    Cancer of appendix (CMS-HCC)    Cancer of colon (CMS-HCC)    Pneumonia    Sarcoma (CMS-HCC)       Past Surgical History     Surgical History:   Procedure Laterality Date    CESAREAN SECTION  1981    HX CHOLECYSTECTOMY  1995    LAPAROSCOPY  02/2017    biopsy peritoneal    COLONOSCOPY DIAGNOSTIC WITH SPECIMEN COLLECTION BY BRUSHING/ WASHING - FLEXIBLE N/A 03/19/2017    Performed by Malissa Norleen LABOR, MD at [facility] ENDO    COLONOSCOPY WITH BIOPSY - FLEXIBLE  03/19/2017    Performed by Malissa Norleen LABOR, MD at [facility] ENDO    EXPLORATORY LAPAROTOMY, DIVERTING LOOP ILEOSTOMY N/A 04/03/2017    Performed by Al-Kasspooles, Mazin, MD at [facility] OR    INSERTION TUNNELED CENTRAL VENOUS  CATHETER - AGE 7 YEARS AND OVER Left 04/23/2017    Performed by Al-Kasspooles, Mazin, MD at [facility] OR    CYSTOURETHROSCOPY WITH INDWELLING URETERAL STENT INSERTION Bilateral 04/09/2018    Performed by Erskin Lenis, MD at [facility] OR    CYSTOURETHROSCOPY WITH URETERAL CATHETERIZATION WITH/ WITHOUT IRRIGATION/ INSTILLATION/ URETEROPYELOGRAPHY Bilateral 04/09/2018    Performed by Erskin Lenis, MD at [facility] OR    CYSTOURETHROSCOPY WITH INDWELLING URETERAL STENT EXCHANGE (RIGHT 6 x 26cm / LEFT 6 x 28cm) Bilateral 08/08/2018    Performed by Erskin Lenis, MD at [facility] OR    RETROGRADE UROGRAPHY WITH/ WITHOUT KUB Bilateral 08/08/2018    Performed by Erskin Lenis, MD at [facility] OR    ENDOSCOPIC RETROGRADE CHOLANGIOPANCREATOGRAPHY WITH SPHINCTEROTOMY/ PAPILLOTOMY N/A 09/16/2021    Performed by Rogers Kipper, MD at [facility] ENDO    ESOPHAGOGASTRODUODENOSCOPY WITH ENDOSCOPIC ULTRASOUND EXAMINATION - FLEXIBLE  09/16/2021    Performed by Rogers Kipper, MD at [facility] ENDO    ENDOSCOPIC RETROGRADE CHOLANGIOPANCREATOGRAPHY WITH PLACEMENT ENDOSCOPIC STENT INTO BILIARY/ PANCREATIC DUCT - EACH STENT  09/16/2021    Performed by Rogers Kipper, MD at [facility] ENDO    COLONOSCOPY DIAGNOSTIC WITH SPECIMEN COLLECTION BY BRUSHING/ WASHING - FLEXIBLE N/A 09/26/2021    Performed by Rogers Kipper, MD at [facility] OR    ENDOSCOPIC RETROGRADE CHOLANGIOPANCREATOGRAPHY WITH PLACEMENT ENDOSCOPIC STENT INTO BILIARY/ PANCREATIC DUCT - EACH STENT  09/29/2021    Performed by Noralyn Blossom, MD at [facility] ENDO    ENDOSCOPIC RETROGRADE CHOLANGIOPANCREATOGRAPHY with plastic stent exchange N/A 10/27/2021    Performed by Noralyn Blossom, MD at [facility] ENDO    ENDOSCOPIC RETROGRADE CHOLANGIOPANCREATOGRAPHY WITH ABLATION TUMOR/ POLYP/ OTHER LESION  10/27/2021    Performed by Noralyn Blossom, MD at [facility] ENDO    ENDOSCOPIC RETROGRADE CHOLANGIOPANCREATOGRAPHY WITH REMOVAL CALCULI/ DEBRIS FROM BILIARY/ PANCREATIC DUCT  10/27/2021    Performed by Noralyn Blossom, MD at [facility] ENDO    ENDOSCOPIC RETROGRADE CHOLANGIOPANCREATOGRAPHY  plastic stent replacement N/A 12/22/2021    Performed by Noralyn Blossom, MD at [facility] ENDO    ENDOSCOPIC RETROGRADE CHOLANGIOPANCREATOGRAPHY N/A 03/30/2022    Performed by Noralyn Blossom, MD at [facility] ENDO    ENDOSCOPIC RETROGRADE CHOLANGIOPANCREATOGRAPHY WITH REMOVAL AND EXCHANGE OF STENT BILIARY/ PANCREATIC DUCT - EACH STENT EXCHANGED N/A 03/30/2022    Performed by Noralyn Blossom, MD at [facility] ENDO    ENDOSCOPIC RETROGRADE CHOLANGIOPANCREATOGRAPHY WITH REMOVAL AND EXCHANGE OF STENT BILIARY/ PANCREATIC DUCT - EACH STENT EXCHANGED N/A 07/27/2022    Performed by Noralyn Blossom, MD at [facility] ENDO    ENDOSCOPIC RETROGRADE CHOLANGIOPANCREATOGRAPHY N/A 11/02/2022    Performed by Noralyn Blossom, MD at [facility] ENDO    ENDOSCOPIC RETROGRADE CHOLANGIOPANCREATOGRAPHY WITH REMOVAL AND EXCHANGE OF STENT BILIARY/ PANCREATIC DUCT - EACH STENT EXCHANGED N/A 11/02/2022    Performed by Noralyn Blossom, MD at [facility] ENDO    ENDOSCOPIC RETROGRADE CHOLANGIOPANCREATOGRAPHY WITH REMOVAL AND EXCHANGE OF STENT BILIARY/ PANCREATIC DUCT - EACH STENT EXCHANGED N/A 01/25/2023    Performed by Noralyn Blossom, MD at [facility] ENDO    ENDOSCOPIC RETROGRADE CHOLANGIOPANCREATOGRAPHY WITH REMOVAL AND EXCHANGE OF STENT BILIARY/ PANCREATIC DUCT - EACH STENT EXCHANGED N/A 04/26/2023    Performed by Noralyn Blossom, MD at [facility] ENDO    ENDOSCOPIC RETROGRADE CHOLANGIOPANCREATOGRAPHY WITH REMOVAL AND EXCHANGE OF STENT BILIARY/ PANCREATIC DUCT - EACH STENT EXCHANGED N/A 07/26/2023    Performed by Noralyn Blossom, MD at [facility] ENDO    ENDOSCOPIC RETROGRADE CHOLANGIOPANCREATOGRAPHY WITH REMOVAL CALCULI/ DEBRIS FROM BILIARY/ PANCREATIC DUCT N/A 08/16/2023    Performed by  Noralyn Blossom, MD at [facility] ENDO    ENDOSCOPIC RETROGRADE CHOLANGIOPANCREATOGRAPHY WITH REMOVAL AND EXCHANGE OF STENT BILIARY/ PANCREATIC DUCT - EACH STENT EXCHANGED N/A 08/16/2023    Performed by Noralyn Blossom, MD at [facility] ENDO    ENDOSCOPIC RETROGRADE CHOLANGIOPANCREATOGRAPHY, WITH NEOPLASM OR POLYP ABLATION N/A 08/16/2023    Performed by Noralyn Blossom, MD at [facility] ENDO    COLON SURGERY  7/20    COLON SURGERY  7/20    HX APPENDECTOMY  1/10    Guess on date    HX CESAREAN SECTION  4/81    HX HYSTERECTOMY  7/20    HX LOWER ANTERIOR RESECTION OF COLON      7/20    HX SALPINGO-OOPHORECTOMY      ILEOSTOMY OR JEJUNOSTOMY Right     LIVER DONOR SURGERY  12/24/21    Liver stents    OVARY SURGERY      Removed 7/20    PORTACATH PLACEMENT  1/20    PR PNCRTECT PROX STOT W/PANCREATOJEJUNOSTOMY  7/20    STOMACH SURGERY      7/20    TUNNELED VENOUS PORT PLACEMENT Left 1/19    URETER STENT PLACEMENT Bilateral        Social History     Social History     Socioeconomic History    Marital status: Married   Tobacco Use Smoking status: Never    Smokeless tobacco: Never   Vaping Use    Vaping status: Never Used   Substance and Sexual Activity    Alcohol use: Not Currently     Comment: rarely    Drug use: Not Currently    Sexual activity: Yes     Partners: Male     Birth control/protection: Post-menopausal       Family History     Family History   Problem Relation Name Age of Onset    Diabetes Mother Murray     Cancer Father Lamar     Diabetes Brother Octaviano     Cancer-Lung Paternal Aunt Doris     Cancer-Breast Maternal Grandmother Sherrilyn     Diabetes Paternal Grandmother Annabelle        Review of Systems   A comprehensive 10-point review of systems is negative except for that noted in the HPI.    Medications     Current Facility-Administered Medications:     acetaminophen  (TYLENOL  EXTRA STRENGTH) tablet 500 mg, 500 mg, Oral, Q6H PRN, Doan, Hieu M, MD    alteplase  (CATHFLO ACTIVASE ) injection 2 mg, 2 mg, Intra-catheter, BID PRN, Doan, Hieu M, MD    calcium  carbonate (TUMS) chew tablet 500 mg, 500 mg, Oral, Q6H PRN, Doan, Hieu M, MD, 500 mg at 08/21/23 1453    carvediloL  (COREG ) tablet 6.25 mg, 6.25 mg, Oral, BID, Mohamad Alahmad, Mohamad Alhoda, MD, 6.25 mg at 08/21/23 2032    cetirizine  (ZyrTEC ) tablet 10 mg, 10 mg, Oral, QDAY PRN, Doan, Hieu M, MD    cyanocobalamin  (vitamin B-12) (RUBRAMIN  PC) injection 1,000 mcg, 1,000 mcg, Intramuscular, QDAY, Doan, Hieu M, MD, 1,000 mcg at 08/21/23 0758    diphenoxylate -atropine  (LOMOTIL ) 2.5-0.025 mg tablet 1 tablet, 1 tablet, Oral, QID PRN, Doan, Hieu M, MD    ERGOcalciferoL  (vitamin D2) (DRISDOL ) capsule 50,000 Units, 50,000 Units, Oral, Q7 Days, Mohamad Alahmad, Mohamad Alhoda, MD    heparin  (porcine) PF syringe 5,000 Units, 5,000 Units, Subcutaneous, Q8H, Mohamad Alahmad, Mohamad Alhoda, MD, 5,000 Units at 08/21/23 1453    hydrALAZINE  (APRESOLINE ) tablet 20 mg, 20 mg,  Oral, TID, Mohamad Alahmad, Mohamad Alhoda, MD, 20 mg at 08/21/23 2031    hydrOXYzine  HCL (ATARAX ) tablet 25 mg, 25 mg, Oral, Q6H PRN, Doan, Hieu M, MD    labetaloL  (NORMODYNE ) injection 10 mg, 10 mg, Intravenous, Q4H PRN, Mohamad Alahmad, Mohamad Alhoda, MD, 10 mg at 08/22/23 0424    loperamide  (IMODIUM  A-D) capsule 2 mg, 2 mg, Oral, QID PRN, Doan, Hieu M, MD    melatonin tablet 5 mg, 5 mg, Oral, QHS PRN, Doan, Hieu M, MD, 5 mg at 08/21/23 2031    ondansetron  (ZOFRAN  ODT) rapid dissolve tablet 4 mg, 4 mg, Oral, Q6H PRN, 4 mg at 08/21/23 1936 **OR** ondansetron  HCL (PF) (ZOFRAN  (PF)) injection 4 mg, 4 mg, Intravenous, Q6H PRN, Doan, Hieu M, MD    pantoprazole  DR (PROTONIX ) tablet 40 mg, 40 mg, Oral, QDAY before breakfast, Doan, Hieu M, MD, 40 mg at 08/21/23 0758    phytonadione  (vitamin K1 ) 10 mg in dextrose  5% (D5) 50 mL IVPB, 10 mg, Intravenous, QDAY, Doan, Hieu M, MD, Last Rate: 100 mL/hr at 08/21/23 0758, 10 mg at 08/21/23 0758    piperacillin /tazobactam (ZOSYN ) 4.5 g in sodium chloride  0.9% (NS) 100 mL IVPB (MB+), 4.5 g, Intravenous, Q12H*, Doan, Hieu M, MD, Last Rate: 200 mL/hr at 08/22/23 0015, 4.5 g at 08/22/23 0015    polyethylene glycol 3350  (MIRALAX ) packet 17 g, 1 packet, Oral, QDAY PRN, Doan, Hieu M, MD    sennosides-docusate sodium  (SENOKOT-S) tablet 1 tablet, 1 tablet, Oral, QDAY PRN, Doan, Hieu M, MD    traMADoL  (ULTRAM ) tablet 50 mg, 50 mg, Oral, Q4H PRN, Doan, Hieu M, MD, 50 mg at 08/21/23 2031    Allergies     Allergies   Allergen Reactions    Indomethacin  SEE COMMENTS     Pain for several days after suppository post ERCP. Tolerated IV but not PR    Shrimp SEE COMMENTS     Eye irritation. Namely blood vessel issues in the eyes          Physical Examination     Vitals:    08/22/23 0022 08/22/23 0402 08/22/23 0424 08/22/23 0459   BP: (!) 143/81 (!) 189/91 (!) (P) 189/96 (!) 164/80   BP Source: Arm, Right Upper Arm, Right Upper (P) Arm, Right Lower Arm, Right Upper   Pulse: 66 66  59   Temp: 36.7 ?C (98.1 ?F) 36.8 ?C (98.3 ?F)     SpO2: 98% 100%     O2 Device: None (Room air) None (Room air)     Weight:           General appearance: Alert, oriented x 3, in NAD  HENT: Normocephalic, atraumatic, oropharynx clear, dentition good repair  Eyes: PERRL, EOM grossly intact, scleral icterus  Neck: Supple, no lymphadenopathy  Heart: Regular rhythm, regular rate, no murmur, rub, gallop  Lungs: Lungs clear to ascultation bilaterally, no wheezing, rhonchi, rales appreciated  Abdomen: Soft, non-tender, non-distended, normoactive bowel sounds, no hepatosplenomegaly, right upper quadrant subcentimeter nodular masses, right lower quadrant ileostomy with firm, nontender 5 to 6 cm mass below stoma, left lower quadrant firm, nontender 3 to 4 cm mass  Ext: No clubbing or cyanosis; bilateral lower extremity edema  Skin: No rashes/lesions  Neuro:  CN II-XII grossly intact, no focal deficits    Lines: Left internal jugular Mediport    Drains/tubes: None    Lab Review   Hematology  Recent Labs     08/20/23  1447 08/20/23  1657 08/20/23  2200 08/21/23  0349 08/22/23  0400   WBC  --   --  18.40* 16.80* 18.80*   HGB  --   --  8.3* 7.9* 8.4*   HCT  --   --  24.3* 23.0* 24.4*   PLTCT  --   --  284 288 273   PTT 43.9*  --   --  33.8  --    INR 5.8* 6.0*  --  1.5* 1.2     Chemistry  Recent Labs     08/20/23  0741 08/21/23  0349 08/22/23  0400   NA 136* 139 141   K 3.8 3.7 3.5   CL 101 106 105   CO2 26 22 24    BUN 43* 42* 40*   CR 2.87* 2.75* 2.84*   GFR 17* 18* 18*   GLU 96 106* 95   CA 9.0 8.3* 8.3*   PO4  --  3.2 2.9   ALBUMIN  3.0* 2.6* 2.7*   ALKPHOS 567* 441* 456*   AST 146* 108* 99*   ALT 97* 70* 74*   TOTBILI 17.2* 16.4* 15.6*       Microbiology, Radiology and other Diagnostics Review   Microbiology data reviewed.    Pertinent radiology images viewed.

## 2023-08-22 NOTE — Progress Notes
 Inpatient Progress Note        Name:  Vicki Buchanan   Medical Record Number: [id number]          Account Number:  [id number]  Date Of Birth:  1955-06-15                           Room and Bed Number -  [id number]  Today's Date:  08/22/2023  LOS: 2 days    Age: 68 years  female    Chief Complaint: Jaundice on 08/20/2023    Admitting diagnosis: Cholangitis (CMS-HCC)    Assessment     Principal Problem:    Cholangitis (CMS-HCC)  Active Problems:    Peritoneal carcinomatosis (CMS-HCC)    Cancer of appendix (CMS-HCC)    Hyperbilirubinemia      68 y.o.   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 S/p Acute pancreatitis on June 4.   Suspected ascending cholangitis on admission  Metastatic mucinous adenocarcinoma of appendix with peritoneal metastasis s/p TAH/BSO 2019 with creation of diverting loop ileostomy, ostomy revision 09/2020, 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  - No fever or abdominal pain prior to admission  - WBC 20.2. Denies steroids use. Denies fever.   -Total bilirubin 15.8 on 5/27 25-17.2 on 08/20/2023.  Beside abx - denies tylenol   or other new medications  - According to OSH discharge summary:  ERCP 5/19 demonstrated indwelling uncovered metal biliary stent in the left hepatic duct which was exchanged with plastic stent, mild stenosis within the metal stent at the proximal main bile duct, diffuse malignant stenoses of intrahepatic ducts; presence of pus consistent with ascending cholangitis.                 > Patient noted w/ pus on 08/06/23 ERCP at [facility] and has been on augmentin  and ucrrently levaquin  for cholangitits. She states hs edoes not have fever during this course. No tylenol  use. Beside abx no other hcnages in medication. SHe does endorsed some LUQ abdominal discomfort.                  > Consultant: GI biliary.  plan for ERCP June 3                > Follow-up on blood cultures                > Start Zosyn  (6/2 - ) pending blood cultures. Patient was   taking levaquin  on admission   > Continue PTA Atarax  as needed for itching (rarely take them), tramadol  PRN for pain                > IVF to be started after blood. Patient does not want another PIV to be placed. She has port.  Pateint is receiving IVF intermittently at home and outpatient.                >  Continue PTA lomotil , immodium prn. Patient states she still has output through her ostomy                > Continue outpatient f/u with oncology. Last cehmo ~ 2 months PTA. She reports plan to take a break then possible monthly treatment  - s/p vit K  - ERCP June 3.                                 Limited views of the esophagus were                                 unremarkable. There was large amount of                                 retained food debris seen in the stomach.                                 Patient had to be turned to the left lateral                                 position to pass the scope through the pylorus                                 into the duodenum. The uncovered metal stent                                 was seen coming out of the ampulla. There was a                                 double-pigtail plastic stent going through the                                 lumen of the stent. This was removed with the help of a snare. The lumen of the metal biliary                                 stent was then cannulated with stone extraction                                 balloon and a guidewire was passed that went                                 into the left intrahepatic duct. We first                                 performed balloon sweep through the lumen of  the stent with extraction large amount of                                 sludge, food debris and stent debris. Following                                 this we performed an occlusion cholangiogram.                                 There was some narrowing seen in the of the                                 stent in the mid portion consistent with                                 tumor/tissue ingrowth. The right hepatic duct                                 was opacified and appeared mildly dilated                                 without any stricture. The left hepatic duct                                 was opacified with difficulty. The left main                                 trunk appeared normal. The left intrahepatic                                 ducts were slender and were not visualized                                 adequately. We then inserted a 10 mm x 8 cm                                 VIABIL stent and deployed it within the                                 previously placed uncovered biliary stent. The                                 upper end of the VIABIL stent was below the                                 hilar confluence. There was good outflow of  contrast after stent deployment. Acquisition                                 interpretation of fluoroscopic images of the                                 bile duct was performed.   - Patient on June for reported that she felt a little bit of pain after the ERCP.  However she felt even worse on June 4.  Lipase more than 3000.  Clear liquid diet for today.  Opioid ordered.  EKG reviewed.     Acute on chronic normocytic anemia  History of GERD  - Hemoglobin 6.9, platelet normal 338. Hemoglobin basleine ~ 8-9  - Patient received blood during her hosptialziation course [place] ~ 08/06/23  - Denies hematemesis, melena, hematochezia through ostomy                > Obtain iron  studi,s B12/folate, retic, smear. : Anisopoikilocytosis with prominent target cell population and mildly increased schistocytes (1/100 X)   Mildly left shifted granulopoiesis and toxic changes                 > Transfused 1 unit of pRBC and repeat CBC afterward. Discussed the risks/benefits  of blood transfusion including   allergic reactions, infections such as HIV, hepatitis B, C and etc. , respiratory failure and other as listed on blood consent.  Patient signed the electronic blood consent on admission encounter.                 > Continue PTA PPI     CKD stage 4  Chronic mild  left hydronephrosis  -BUN 43, creatinine 2.87.  Creatinine sinc 05/2022 had been around mid 2.5-2.8  -  OSH CT abdomen/pelvis on 08/14/23: 1.  Somewhat limited evaluation the absence of IV contrast. Extensive metastatic disease throughout the abdomen and pelvis including peritoneal implants with mass effect on adjacent structures. Left posterior pleural implant with invasion of the chest wall and adjacent smaller nodules.  2.  Common bile duct and biliary stent in place. Moderate to intrahepatic biliary ductal dilatation. 3.  Right lower quadrant ostomy encased with implants. No gas-filled dilated loops of bowel to suggest obstruction. 4.  Mild left hydronephrosis with prominent left extrarenal pelvis. Findings may be chronic versus secondary to extrinsic compression from peritoneal disease. 5.  Diffuse bladder wall thickening with adjacent fat stranding, possibly secondary to acute cystitis. Correlation with urinalysis is recommended. 6.  Distended vaginal cuff with air-fluid level. The posterior wall of the urinary bladder is adjacent to the vaginal cuff without preserved fat plane of separation.                > Patient states she has appointment w/ Dr. Lorrene on 08/21/23 and was told by outpatient team to consult Dr. Lorrene since she will hospitalized.                  > Consult nephrology (Dr. Lorrene) if available                > Continue to monitor urine output.   Of note patient used to be on macrobid  --> cipro  for ?UTI ppx however has been off since recent abx course.   -High risk for volume overload.  Currently with maintenance  fluid that is usually given at baseline.  No shortness of breath, lower extremity swelling on June 4.     Hypertensive urgency  - Encourage p.o. intake  - Hydralazine , can titrate as tolerated.  Carvedilol  with holding parameters  - Monitor volume status closely  - Optimal control of the pain.  Medication dose adjusted.  - Echocardiogram    Replace vitamin D  and B12 deficiencies     DIET CLEAR LIQUID   records from previous encounters have been reviewed.  Radiology, imaging, labs were reviewed and summarized as above.    Results, diagnoses, prognoses were reviewed with the patient   Benefits / risks were explained in details  pt was educated, informed about the importance of compliance and instructed to follow up with the PCP,     discussed with RN, as well as pharmacy, and SW in multidisciplinary round    PT/ OT      Total floor/unit time (reviewing and writing notes, examining the patient, reviewing test results etc) spent was including time spent in care coordination  and bedside eval      This note  was created using  [employer], hence  some grammatical errors may still be present despite  editing at the time of the dictation, )    Subjective:      Pt seen and Chart reviewed.   No issue overnight.     Patient reported that her pain gradually got worse in epigastric area radiating to the back.  She did not had this pain when she came to the hospital she reported.  She also had nausea and dry heaves.  No hematemesis.  No cough or shortness of breath.  She was slightly discomfort this morning.  She reported that she felt better with tramadol .  With her kidney function, that was discontinued.    ROS:      A comprehensive 10-point ROS was negative except as above    Medications:    Scheduled Meds:carvediloL  (COREG ) tablet 6.25 mg, 6.25 mg, Oral, BID  cyanocobalamin  (vitamin B-12) (RUBRAMIN  PC) injection 1,000 mcg, 1,000 mcg, Intramuscular, QDAY  ERGOcalciferoL  (vitamin D2) (DRISDOL ) capsule 50,000 Units, 50,000 Units, Oral, Q7 Days  heparin  (porcine) PF syringe 5,000 Units, 5,000 Units, Subcutaneous, Q8H  hydrALAZINE  (APRESOLINE ) tablet 20 mg, 20 mg, Oral, TID  magnesium  sulfate   1 g/D5W 100 mL IVPB, 1 g, Intravenous, Q4H  pantoprazole  (PROTONIX ) injection 40 mg, 40 mg, Intravenous, QDAY  piperacillin /tazobactam (ZOSYN ) 4.5 g in sodium chloride  0.9% (NS) 100 mL IVPB (MB+), 4.5 g, Intravenous, Q12H*  sodium chloride  PF 0.9% injection 10 mL, 10 mL, Intravenous, ONCE    Continuous Infusions:   lactated ringers  infusion 50 mL/hr at 08/22/23 0902       PRN and Respiratory Meds:acetaminophen  Q6H PRN, alteplase  BID PRN, calcium  carbonate Q6H PRN, cetirizine  QDAY PRN, diphenoxylate -atropine  QID PRN, fentaNYL  citrate PF Q3H while awake PRN, hydrOXYzine  HCL Q6H PRN, labetalol  (NORMODYNE ; TRANDATE ) injection Q4H PRN, loperamide  QID PRN, melatonin QHS PRN, ondansetron  Q6H PRN **OR** ondansetron  (ZOFRAN ) IV Q6H PRN, oxyCODONE  Q4H PRN, perflutren  lipid microspheres Once PRN **AND** sodium chloride  PF 0.9% ONCE, polyethylene glycol 3350  QDAY PRN, sennosides-docusate sodium  QDAY PRN, trimethobenzamide  Q6H PRN      Objective:                            Vital Signs: Last Filed  Vital Signs: 24 Hour Range   BP: 175/86 (06/04 1503)  Temp: 36 ?C (96.8 ?F) (06/04 1503)  Pulse: 67 (06/04 1503)  Respirations: 18 PER MINUTE (06/04 1503)  SpO2: 99 % (06/04 1503)  O2 Device: None (Room air) (06/04 0831)  Height: 157.5 cm (5' 2) (06/04 1057) BP: (143-189)/(72-91)   Temp:  [36 ?C (96.8 ?F)-37.1 ?C (98.7 ?F)]   Pulse:  [59-87]   Respirations:  [17 PER MINUTE-19 PER MINUTE]   SpO2:  [97 %-100 %]   O2 Device: None (Room air)   Intensity Pain Scale (Self Report): 10 (08/22/23 0902) Vitals:    08/20/23 2319 08/22/23 1057 08/22/23 1143   Weight: 51.1 kg (112 lb 9.6 oz) 51.3 kg (113 lb) 51.3 kg (113 lb)         Intake/Output Summary:  (Last 24 hours)    Intake/Output Summary (Last 24 hours) at 08/22/2023 1520  Last data filed at 08/22/2023 1501  Gross per 24 hour   Intake --   Output 400 ml   Net -400 ml           Physical Exam    General: Patient seems to be clinically stable and in mild-mod immediate distress. AAO x3 .  Jaundiced/brownish  HEENT: NC/AT  Heart: S1 S2 present, RRR, no audible rubs or murmurs heard  Lungs: Bilateral air entry noted, normal breath sounds and no additional sounds heard  Abdomen: Soft, epigastric tenderness without rebound  . Bowel sounds were present .  Ileostomy in place  Extremities: no pedal edema; no calf tenderness  Skin: No obvious skin rashes   Neurologic: Seems oriented. No obvious abnormality of cranial nerves, motor system noted      Labs reviwed, Notable for:        Recent Labs     08/20/23  0741 08/21/23  0349 08/22/23  0400   NA 136* 139 141   K 3.8 3.7 3.5   CL 101 106 105   CO2 26 22 24    GAP 9 11 12    BUN 43* 42* 40*   CR 2.87* 2.75* 2.84*   GLU 96 106* 95   CA 9.0 8.3* 8.3*   ALBUMIN  3.0* 2.6* 2.7*   MG 1.6 1.6 1.6   PO4  --  3.2 2.9   HGBA1C  --  5.5  --    TSH  --  2.78  --        Recent Labs     08/20/23  0741 08/20/23  1447 08/20/23  1657 08/20/23  2200 08/21/23  0349 08/22/23  0400   WBC 20.20*  --   --  18.40* 16.80* 18.80*   HGB 6.9*  --   --  8.3* 7.9* 8.4*   HCT 20.0*  --   --  24.3* 23.0* 24.4*   PLTCT 338  --   --  284 288 273   PT  --  62.7* 64.8*  --  17.4* 14.1   INR  --  5.8* 6.0*  --  1.5* 1.2   PTT  -- 43.9*  --   --  33.8  --    AST 146*  --   --   --  108* 99*   ALT 97*  --   --   --  70* 74*   ALKPHOS 567*  --   --   --  441* 456*      Estimated Creatinine Clearance: 15.4 mL/min (A) (based on SCr of 2.84 mg/dL (  H)).  Vitals:    08/20/23 2319 08/22/23 1057 08/22/23 1143   Weight: 51.1 kg (112 lb 9.6 oz) 51.3 kg (113 lb) 51.3 kg (113 lb)         Resulted Micro Last 72 Hrs    No results found         Malnutrition Details:                                                     Active Wounds

## 2023-08-22 NOTE — Progress Notes
 OCCUPATIONAL THERAPY  NOTE   Name: Vicki Buchanan   MRN: [id number]     DOB: 29-Oct-1955      Age: 68 y.o.  Admission Date: 08/20/2023     LOS: 2 days     Date of Service: 08/22/2023      Patient declined to participate despite encouragement and education about the role and benefits of occupational therapy. Patient reports not feeling well this morning. Patient reports ambulating to the bathroom with the assistance of her husband and completing ADLs on her own. Patient requested OT to return tomorrow when she is feeling better. Occupational therapy will continue to follow and provide intervention as indicated.       Therapist: Liana Evangelist, OTS  Date: 08/22/2023

## 2023-08-22 NOTE — Anesthesia Pain Rounding
 Anesthesia Follow-Up Evaluation: Post-Procedure Day One    Name: Vicki Buchanan     MRN: [id number]     DOB: 04/08/1955     Age: 68 y.o.     Sex: female   __________________________________________________________________________     Procedure Date: 08/21/2023   Procedure: Procedure(s):  ENDOSCOPIC RETROGRADE CHOLANGIOPANCREATOGRAPHY WITH REMOVAL AND EXCHANGE OF STENT BILIARY/ PANCREATIC DUCT - EACH STENT EXCHANGED    Physical Assessment     Weight: 51.3 kg (113 lb)    Vital Signs (Last Filed in 24 hours)  BP: 164/88 (06/04 1057)  Temp: 37.1 ?C (98.7 ?F) (06/04 0831)  Pulse: 60 (06/04 1030)  Respirations: 18 PER MINUTE (06/04 1030)  SpO2: 99 % (06/04 1030)  O2 Device: None (Room air) (06/04 0831)    Patient History   Allergies  Allergies   Allergen Reactions    Indomethacin  SEE COMMENTS     Pain for several days after suppository post ERCP. Tolerated IV but not PR    Shrimp SEE COMMENTS     Eye irritation. Namely blood vessel issues in the eyes         Medications  Scheduled Meds:carvediloL  (COREG ) tablet 6.25 mg, 6.25 mg, Oral, BID  cyanocobalamin  (vitamin B-12) (RUBRAMIN  PC) injection 1,000 mcg, 1,000 mcg, Intramuscular, QDAY  ERGOcalciferoL  (vitamin D2) (DRISDOL ) capsule 50,000 Units, 50,000 Units, Oral, Q7 Days  heparin  (porcine) PF syringe 5,000 Units, 5,000 Units, Subcutaneous, Q8H  hydrALAZINE  (APRESOLINE ) tablet 20 mg, 20 mg, Oral, TID  pantoprazole  (PROTONIX ) injection 40 mg, 40 mg, Intravenous, QDAY  piperacillin /tazobactam (ZOSYN ) 4.5 g in sodium chloride  0.9% (NS) 100 mL IVPB (MB+), 4.5 g, Intravenous, Q12H*    Continuous Infusions:   lactated ringers  infusion 50 mL/hr at 08/22/23 0902     PRN and Respiratory Meds:acetaminophen  Q6H PRN, alteplase  BID PRN, calcium  carbonate Q6H PRN, cetirizine  QDAY PRN, diphenoxylate -atropine  QID PRN, fentaNYL  citrate PF Q3H while awake PRN, hydrOXYzine  HCL Q6H PRN, labetalol  (NORMODYNE ; TRANDATE ) injection Q4H PRN, loperamide  QID PRN, melatonin QHS PRN, ondansetron  Q6H PRN **OR** ondansetron  (ZOFRAN ) IV Q6H PRN, oxyCODONE  Q4H PRN, polyethylene glycol 3350  QDAY PRN, sennosides-docusate sodium  QDAY PRN, trimethobenzamide  Q6H PRN      Diagnostic Tests  Hematology:   Lab Results   Component Value Date    HGB 8.4 08/22/2023    HGB 8.4 08/14/2023    HCT 24.4 08/22/2023    HCT 24.0 08/14/2023    PLTCT 273 08/22/2023    PLTCT 384 08/14/2023    WBC 18.80 08/22/2023    WBC 15.50 08/14/2023    NEUT 60.1 08/20/2023    NEUT 46 01/25/2023    ANC 16.9 08/22/2023    ANC 4.49 01/25/2023    LYMPH 3 08/22/2023    LYMPH 36 11/02/2022    ALC 5.40 08/20/2023    ALC 2.24 01/25/2023    MONA 10.8 08/20/2023    MONA 16 01/25/2023    AMC 2.20 08/20/2023    AMC 1.53 01/25/2023    EOSA 0.8 08/20/2023    EOSA 14 01/25/2023    ABC 0.30 08/20/2023    ABC 0.06 01/25/2023    BASOPHILS 1 11/02/2022    MCV 93.4 08/22/2023    MCV 92.3 08/14/2023    MCH 32.3 08/22/2023    MCH 32.3 08/14/2023    MCHC 34.6 08/22/2023    MCHC 35.0 08/14/2023    MPV 8.6 08/22/2023    MPV 10.8 08/14/2023    RDW 21.8 08/22/2023    RDW 25.1 08/14/2023  General Chemistry:   Lab Results   Component Value Date    NA 141 08/22/2023    NA 140 08/14/2023    K 3.5 08/22/2023    K 4.1 08/14/2023    CL 105 08/22/2023    CL 108 08/14/2023    CO2 24 08/22/2023    CO2 22.0 08/14/2023    GAP 12 08/22/2023    GAP 11 01/25/2023    BUN 40 08/22/2023    BUN 25.5 08/14/2023    CR 2.84 08/22/2023    CR 2.82 08/14/2023    GLU 95 08/22/2023    GLU 150 08/14/2023    CA 8.3 08/22/2023    CA 9.4 08/14/2023    ALBUMIN  2.7 08/22/2023    ALBUMIN  2.2 08/14/2023    LACTIC 1.1 08/20/2023    MG 1.6 08/22/2023    MG 1.9 01/25/2023    TOTBILI 15.6 08/22/2023    TOTBILI 15.87 08/14/2023    PO4 2.9 08/22/2023    PO4 3.3 01/30/2022      Coagulation:   Lab Results   Component Value Date    PT 14.1 08/22/2023    PT 14.0 01/25/2023    PTT 33.8 08/21/2023    PTT 28.7 07/10/2018    INR 1.2 08/22/2023    INR 1.3 01/25/2023         Follow-Up Assessment  Patient location during evaluation: floor      Anesthetic Complications:   Anesthetic complications: The patient did not experience any anesthestic complications.      Pain:    Management:adequate     Level of Consciousness: awake and alert   Hydration:acceptable     Airway Patency: patent   Respiratory Status: acceptable and room air     Cardiovascular Status:acceptable   Regional/Neuroaxial:              Comments: Patient reports no complications related to anesthesia and has no further questions.

## 2023-08-23 ENCOUNTER — Inpatient Hospital Stay: Admit: 2023-08-23 | Discharge: 2023-08-23 | Payer: MEDICARE

## 2023-08-23 ENCOUNTER — Encounter: Admit: 2023-08-23 | Discharge: 2023-08-23 | Payer: MEDICARE

## 2023-08-23 LAB — PHOSPHORUS: ~~LOC~~ BKR PHOSPHORUS: 4.8 mg/dL — ABNORMAL HIGH (ref 2.0–4.5)

## 2023-08-23 LAB — ECG 12-LEAD
P AXIS: 44 degrees
P-R INTERVAL: 186 ms
Q-T INTERVAL: 482 ms
QRS DURATION: 150 ms
QTC CALCULATION (BAZETT): 501 ms
R AXIS: -47 degrees
T AXIS: -25 degrees
VENTRICULAR RATE: 65 {beats}/min

## 2023-08-23 LAB — MANUAL DIFF
~~LOC~~ BKR EOSINOPHILS - RELATIVE: 2 % (ref 0–5)
~~LOC~~ BKR LYMPHOCYTES - RELATIVE: 8 % — ABNORMAL LOW (ref 24–44)
~~LOC~~ BKR MONOCYTES - RELATIVE: 9 % (ref 4–12)
~~LOC~~ BKR NEUT+BANDS - ABSOLUTE: 15 10*3/uL — ABNORMAL HIGH (ref 1.8–7.0)
~~LOC~~ BKR NEUTROPHILS - RELATIVE: 81 % — ABNORMAL HIGH (ref 41–77)
~~LOC~~ BKR NUCLEATED RBC MANUAL: 3 10*3/uL

## 2023-08-23 LAB — RVP VIRAL PANEL PCR

## 2023-08-23 LAB — BASIC METABOLIC PANEL
~~LOC~~ BKR ANION GAP: 13 mmol/L — ABNORMAL HIGH (ref 3–12)
~~LOC~~ BKR BLD UREA NITROGEN: 51 mg/dL — ABNORMAL HIGH (ref 7–25)
~~LOC~~ BKR CALCIUM: 7.7 mg/dL — ABNORMAL LOW (ref 8.5–10.6)
~~LOC~~ BKR CHLORIDE: 101 mmol/L — ABNORMAL LOW (ref 98–110)
~~LOC~~ BKR CO2: 21 mmol/L (ref 21–30)
~~LOC~~ BKR GLOMERULAR FILTRATION RATE (GFR): 15 mL/min — ABNORMAL LOW (ref >60–12)
~~LOC~~ BKR POTASSIUM: 4.3 mmol/L — ABNORMAL LOW (ref 3.5–5.1)
~~LOC~~ BKR SODIUM, SERUM: 135 mmol/L — ABNORMAL LOW (ref 137–147)

## 2023-08-23 LAB — PROTIME INR (PT): ~~LOC~~ BKR PROTIME: 13 s — ABNORMAL HIGH (ref 9.9–14.2)

## 2023-08-23 LAB — CBC AND DIFF
~~LOC~~ BKR MCV: 94 fL — ABNORMAL HIGH (ref 80.0–100.0)
~~LOC~~ BKR RBC COUNT: 2.7 10*6/uL — ABNORMAL LOW (ref 4.00–5.00)
~~LOC~~ BKR WBC COUNT: 18 10*3/uL — ABNORMAL HIGH (ref 4.50–11.00)

## 2023-08-23 LAB — POC LACTATE: ~~LOC~~ BKR POC LACTIC ACID: 1.8 mmol/L (ref 0.5–2.0)

## 2023-08-23 LAB — COMPREHENSIVE METABOLIC PANEL: ~~LOC~~ BKR POTASSIUM: 3.7 mmol/L — ABNORMAL LOW (ref 3.5–5.1)

## 2023-08-23 MED ORDER — LACTATED RINGERS IV BOLUS
1000 mL | Freq: Once | INTRAVENOUS | 0 refills | Status: CP
Start: 2023-08-23 — End: ?
  Administered 2023-08-23: 14:00:00 1000 mL via INTRAVENOUS

## 2023-08-23 MED ORDER — CARVEDILOL 6.25 MG PO TAB
6.25 mg | Freq: Two times a day (BID) | ORAL | 0 refills | Status: DC
Start: 2023-08-23 — End: 2023-08-24

## 2023-08-23 MED ORDER — HYDRALAZINE 10 MG PO TAB
20 mg | Freq: Three times a day (TID) | ORAL | 0 refills | Status: DC
Start: 2023-08-23 — End: 2023-08-24

## 2023-08-23 MED ORDER — LACTATED RINGERS IV BOLUS
500 mL | Freq: Once | INTRAVENOUS | 0 refills | Status: CP
Start: 2023-08-23 — End: ?
  Administered 2023-08-24: 03:00:00 500 mL via INTRAVENOUS

## 2023-08-23 MED ORDER — LACTATED RINGERS IV BOLUS
500 mL | Freq: Once | INTRAVENOUS | 0 refills | Status: CP
Start: 2023-08-23 — End: ?
  Administered 2023-08-24: 01:00:00 500 mL via INTRAVENOUS

## 2023-08-23 MED ORDER — LACTATED RINGERS IV SOLP
INTRAVENOUS | 0 refills | Status: CP
Start: 2023-08-23 — End: ?
  Administered 2023-08-24: 1000.0000 mL via INTRAVENOUS

## 2023-08-23 NOTE — Case Management (ED)
 CMA Note:       Added in network PT within 30 miles of the zip code listed per request of Asberry Jasmine, RNCM.    Chiquita Landry  Case Management Assistant  For additional assistance please contact RNCM  *

## 2023-08-23 NOTE — Progress Notes
 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 without evidence of ascending cholangitis  Pancreatitis, chemical post ERCP  08/03/2023 admitted to [facility] with acute cholangitis  Started Zosyn   08/04/2023 urine culture: >100K CFU Enterococcus faecalis (S) ampicillin   08/06/2023 underwent ERCP which revealed indwelling uncovered metal biliary stent in the left hepatic duct which was exchanged with plastic stent, mild stenosis within the metal stent at the proximal main bile duct, diffuse malignant stenoses of intrahepatic ducts; presence of purulence  08/07/2023 discontinued Zosyn ; started Augmentin ; discharged home  08/16/2023 underwent ERCP which revealed a protruding indwelling metallic stent in CBD seen coming out from ampulla with a plastic stent inside the metal stent, plastic stent was removed, occlusion cholangiogram: right posterior intrahepatic duct opacified; left hepatic system not opacified and totally; right anterior intrahepatic with branches partially opacified with significant narrowing beyond indwelling stent up to the hilum of liver; palced a Habiba catheter size 8 French, 2.7 and RFA x 2; large amount of tumor ingrowth inside the indwelling stent with large amount of tissue growing at the entrance of the stent which again this area also tumor destruction applied using RFA x 2; placed a 10 French, 10 cm double-pigtail plastic stent was successfully placed into the right intrahepatic duct; good outflow of bile and contrast seen through both the stents  08/20/2023 seen in oncology clinic [facility], noted to have persistent jaundice status post ERP and stent placement, received cefepime  x 1 dose  Directly admitted to the hospitalist service  Blood cultures: No growth x 2 sets  Urine culture: No growth  Started Zosyn   Underwent ERCP which revealed uncovered metal stent in CBD extending out of ampulla with a double-pigtail plastic stent through the lumen of the uncovered metal stent, double-pigtail plastic stent removed, stone extraction balloon passed into left intrahepatic duct, balloon sweeps with extraction of a large amount of sludge, food debris and stent debris; occlusion cholangiogram velum narrowing of the stent midportion consistent with tumor ingrowth; palced 10 mm x 8 cm VIABIL stent within the previously placed uncovered biliary stent, upper end of the VIABIL stent was below the hilar confluence, good outflow of contrast after stent deployment   08/23/2023 CT ABD/pelvis (personally viewed on PACS): Development of small left pleural effusion and minimal basilar   bronchiolitis. No small bowel obstruction, ascites, or free air. Redemonstration of extensive partially calcified peritoneal metastatic disease. New small subcutaneous nodule right lateral pelvis which is probably a new metastasis. Right ischial pressure ulcer.      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 with subsequent removal of ureteral stents  02/2017 diagnosed on laparoscopy  04/03/2017 underwent loop ileostomy creation  12/2017 started epacadosta and sirolimus   05/2018 discontinued epacadosta and sirolimus   05/27/2020 underwent laparoscopic HIPEC with mitomycin-C (trial at [facility])  10/07/2018 underwent second laparoscopic HIPEC  09/12/2018 underwent tumor debulking [facility])   05/2020 underwent radiation therapy to right lung metastases  04/16/2020 underwent cytoreductive surgery with ostomy revision due to tumor invasion  09/2020 started FOLFIRI and bevacizumab    09/07/2021 MRI/MRCP: Slight apparent increase in diffuse intrahepatic and extrahepatic biliary ductal dilatation to the level of the porta hepatis where there is   extrinsic  compression on the extrahepatic common duct from metastases. No   biliary filling defect to suggest choledocholithiasis or biliary mass. Grossly unchanged extensive peritoneal carcinomatosis and right abdominal wall metastases. Partially visualized indeterminate lesion in the left sacrum, which may   be a metastasis or hemangioma.   09/16/2021 underwent ERCP this month of an uncovered metal stent in the CBD  09/2021 held bevacizumab    08/04/2023 CT ABD/pelvis: Extensive metastatic disease throughout the abdomen and pelvis including peritoneal implants with mass effect on adjacent structures. Left posterior pleural implant with invasion of the chest wall and adjacent smaller nodules. Common bile duct and biliary stent in place. Moderate to intrahepatic biliary ductal dilatation. Right lower quadrant ostomy encased with implants. No gas-filled dilated loops of bowel to suggest obstruction. Mild left hydronephrosis with prominent left extrarenal pelvis. Diffuse bladder wall thickening with adjacent fat stranding, possibly secondary to acute cystitis. Correlation with urinalysis is recommended. Distended vaginal cuff with air-fluid level. The posterior wall of the urinary bladder is adjacent to the vaginal cuff without preserved fat plane of separation.   08/20/2023 CEA 2565.5  Currently FOLFIRI cycle 40     CKD, stage IV     Recurrent urinary tract infections     Pelvic abscess following tumor debulking  10/2018 abscess cultures: Serratia and Candida albicans  Underwent IR abscess drain placement  Received course of cefepime  and fluconazole      Asplenia, surgical  02/2018 Pneumovax   09/2018 PCV13 (Prevnar)   09/2018 Haemophilus influenzae type B   Meningococcal conjugate vaccine (Menveo) 09/30/2018 and 11/2018  Meningococcal serogroup B vaccine (MenB) 09/30/2018 and 10/28/2018      Recommendations:   Discontinue Zosyn .  Observe OFF of antibiotics.  Await blood cultures.  Await urine culture.  Appreciate gastroenterology's assistance.  Appreciate medical oncology input.  Discussed with the patient and her husband at the bedside.  All questions answered to their understanding.  Prognosis is guarded.  We will continue to follow with you.    Today's encounter included some or all of the following:  Disease Transmission Risk Assessment and Mitigation: Developing, following, and supervising specialized, individualized infection control protocols for an individual patient based on their diagnosis and risks in order to reduce risk of disease transmission. Coordinating with human resources regarding infection prevention and control measures to enable healthcare facility staff to safely care for patient. Counseling patients, family members and caregivers regarding infection prevention. Managing infection prevention and treatment protocols associated with transitions of care for complex patients.   Public Health Investigation, analysis, and testing: In-depth patient chart review that entails going back farther in time and assessing the complete breadth of all health care interactions, with higher-level synthesis for complex diagnoses. Communicating with the clinical microbiology lab and directly reviewing specimens. Coordinating specialized diagnostic evaluations (for example, identifying and facilitating diagnostic laboratory tests only available at specialized laboratories, the state health department, and/or the CDC). Coordinating with Federal, State and local public health agencies and laboratories to assist with contact tracing, obtaining specimens for specialized testing, and/or identifying prior testing and treatment for communicable diseases in other jurisdictions.   Complex Antimicrobial Therapy Counseling & Treatment: Counseling patients, family members, and caregivers regarding antimicrobial stewardship and resistance for the patient.   Engaging in complex medical decision-making associated with antimicrobial prescribing including considerations such as antimicrobial resistance patterns, emergence of new variants/strains, recent antibiotic exposure, interactions/complications from comorbidities including concurrent infections, public health considerations to minimize development of antimicrobial resistance, and emerging and re-emerging infections.  Thank you for the consultation.     Adina Bouche, DO  Associate Professor  Division of Infectious Diseases    History of Present Illness    Vicki Buchanan is a 68 y.o.  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     Denies fevers or chills.  Denies nausea, vomiting, or diarrhea.  Denies abdominal pain.  Appetite has improved.      Antimicrobial Start date End date                                          Medications     Current Facility-Administered Medications:     acetaminophen  (TYLENOL  EXTRA STRENGTH) tablet 500 mg, 500 mg, Oral, Q6H PRN, Doan, Hieu M, MD    alteplase  (CATHFLO ACTIVASE ) injection 2 mg, 2 mg, Intra-catheter, BID PRN, Doan, Hieu M, MD    calcium  carbonate (TUMS) chew tablet 500 mg, 500 mg, Oral, Q6H PRN, Doan, Hieu M, MD, 500 mg at 08/21/23 1453    carvediloL  (COREG ) tablet 6.25 mg, 6.25 mg, Oral, BID, Mohamad Alahmad, Mohamad Alhoda, MD    cetirizine  (ZyrTEC ) tablet 10 mg, 10 mg, Oral, QDAY PRN, Doan, Hieu M, MD    cyanocobalamin  (vitamin B-12) (RUBRAMIN  PC) injection 1,000 mcg, 1,000 mcg, Intramuscular, QDAY, Doan, Hieu M, MD, 1,000 mcg at 08/23/23 9175    diphenoxylate -atropine  (LOMOTIL ) 2.5-0.025 mg tablet 1 tablet, 1 tablet, Oral, QID PRN, Doan, Hieu M, MD    ERGOcalciferoL  (vitamin D2) (DRISDOL ) capsule 50,000 Units, 50,000 Units, Oral, Q7 Days, Mohamad Alahmad, Mohamad Alhoda, MD, 50,000 Units at 08/22/23 0834    fentaNYL  citrate PF (SUBLIMAZE ) injection 25 mcg, 25 mcg, Intravenous, Q3H while awake PRN, Mohamad Alahmad, Mohamad Alhoda, MD, 25 mcg at 08/22/23 0902    heparin  (porcine) PF syringe 5,000 Units, 5,000 Units, Subcutaneous, Q8H, Mohamad Alahmad, Mohamad Alhoda, MD, 5,000 Units at 08/23/23 0550    hydrALAZINE  (APRESOLINE ) tablet 20 mg, 20 mg, Oral, TID, Mohamad Alahmad, Mohamad Alhoda, MD    hydrOXYzine  HCL (ATARAX ) tablet 25 mg, 25 mg, Oral, Q6H PRN, Doan, Hieu M, MD    labetaloL  (NORMODYNE ) injection 10 mg, 10 mg, Intravenous, Q4H PRN, Mohamad Alahmad, Mohamad Alhoda, MD, 10 mg at 08/22/23 1009    lactated ringers  infusion, , Intravenous, Continuous, Mohamad Alahmad, Roderic Butcher, MD, Last Rate: 75 mL/hr at 08/23/23 0839, Rate Change at 08/23/23 0839    lactated ringers  IV bolus 1,000 mL, 1,000 mL, Intravenous, ONCE, Mohamad Alahmad, Roderic Butcher, MD, Last Rate: 1,000 mL/hr at 08/23/23 0852, 1,000 mL at 08/23/23 9147    loperamide  (IMODIUM  A-D) capsule 2 mg, 2 mg, Oral, QID PRN, Doan, Hieu M, MD    melatonin tablet 5 mg, 5 mg, Oral, QHS PRN, Doan, Hieu M, MD, 5 mg at 08/22/23 2200    ondansetron  (ZOFRAN  ODT) rapid dissolve tablet 4 mg, 4 mg, Oral, Q6H PRN, 4 mg at 08/22/23 2024 **OR** ondansetron  HCL (PF) (ZOFRAN  (PF)) injection 4 mg, 4 mg, Intravenous, Q6H PRN, Doan, Hieu M, MD    oxyCODONE  (ROXICODONE ) tablet 5 mg, 5 mg, Oral, Q4H PRN, Mohamad Alahmad, Mohamad Alhoda, MD, 5 mg at 08/23/23 0550    pantoprazole  (PROTONIX ) injection 40 mg, 40 mg, Intravenous, QDAY, Mohamad Alahmad, Mohamad Alhoda, MD, 40 mg at 08/23/23 9175    piperacillin /tazobactam (ZOSYN ) 4.5  g in sodium chloride  0.9% (NS) 100 mL IVPB (MB+), 4.5 g, Intravenous, Q12H*, Doan, Hieu M, MD, Last Rate: 200 mL/hr at 08/23/23 0034, 4.5 g at 08/23/23 0034    polyethylene glycol 3350  (MIRALAX ) packet 17 g, 1 packet, Oral, QDAY PRN, Doan, Hieu M, MD    sennosides-docusate sodium  (SENOKOT-S) tablet 1 tablet, 1 tablet, Oral, QDAY PRN, Doan, Hieu M, MD    trimethobenzamide  (TIGAN ) injection 200 mg, 200 mg, Intramuscular, Q6H PRN, Mohamad Alahmad, Roderic Butcher, MD, 200 mg at 08/22/23 1456      Physical Examination     Vitals:    08/23/23 0338 08/23/23 0645 08/23/23 0728 08/23/23 0824   BP: 108/65  103/61 92/59   BP Source: Arm, Right Upper  Arm, Right Upper    Pulse: 59  75 58   Temp:  36.8 ?C (98.2 ?F)     SpO2: 100%  100%    O2 Device: None (Room air)  None (Room air)    Weight:       Height:           General appearance: Alert, oriented x 3, in NAD  HENT: Normocephalic, atraumatic, oropharynx clear, dentition good repair  Eyes: PER, EOM grossly intact, scleral icterus  Neck: Supple, no lymphadenopathy  Heart: Regular rhythm, regular rate, no murmur, rub, gallop  Lungs: Lungs clear to ascultation bilaterally, no wheezing, rhonchi, rales appreciated  Abdomen: Soft, non-tender, non-distended, normoactive bowel sounds, no hepatosplenomegaly, right upper quadrant subcentimeter nodular masses, right lower quadrant ileostomy with firm, nontender 5 to 6 cm mass below stoma, left lower quadrant firm, nontender 3 to 4 cm mass  Ext: No clubbing or cyanosis; bilateral lower extremity edema  Skin: No rashes/lesions  Neuro:  CN II-XII grossly intact, no focal deficits     Lines: Left internal jugular Mediport     Drains/tubes: None    Lab Review   Hematology  Recent Labs     08/20/23  1447 08/20/23  1657 08/21/23  0349 08/22/23  0400 08/23/23  0331   WBC  --    < > 16.80* 18.80* 18.70*   HGB  --    < > 7.9* 8.4* 8.8*   HCT  --    < > 23.0* 24.4* 25.8*   PLTCT  --    < > 288 273 274   PTT 43.9*  --  33.8  --   --    INR 5.8*   < > 1.5* 1.2 1.2    < > = values in this interval not displayed.     Chemistry  Recent Labs     08/21/23  0349 08/22/23  0400 08/23/23  0331   NA 139 141 139   K 3.7 3.5 3.7   CL 106 105 103   CO2 22 24 21    BUN 42* 40* 47*   CR 2.75* 2.84* 3.17*   GFR 18* 18* 15*   GLU 106* 95 105*   CA 8.3* 8.3* 8.3*   PO4 3.2 2.9 4.8*   ALBUMIN  2.6* 2.7* 2.7*   ALKPHOS 441* 456* 423*   AST 108* 99* 100*   ALT 70* 74* 71*   TOTBILI 16.4* 15.6* 16.9*       Microbiology, Radiology and other Diagnostics Review     Resulted Micro Last 24 Hrs    No results found       Microbiology data reviewed.    Pertinent radiology images viewed.

## 2023-08-23 NOTE — Progress Notes
 PHYSICAL THERAPY  ASSESSMENT      Name: Vicki Buchanan   MRN: [id number]     DOB: 1955-11-22      Age: 68 y.o.  Admission Date: 08/20/2023     LOS: 3 days     Date of Service: 08/23/2023      Mobility  Patient Turn/Position: Self  Mobility Level [facility] Highest Level of Mobility (JH-HLM): Walk 25 feet or more (to doorway/hallway)  Distance Walked (feet): 150 ft  Level of Assistance: Stand by assistance  Assistive Device: Other (Comment) (IV pole)  Activity Limited By: [employer] / [employer];Fatigue    Subjective  Significant Hospital Events: 68 y.o.   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 (last one on 08/16/23) , cholangitits s/p abx (augmentin , levaquin  currently) directly admitted for hemoglobin of 6.9, rising total biliburin and leukocytosis admitted to IM for further evaluation management  Mental / Cognitive: Alert;Oriented;Cooperative  Pain: No complaint of pain  Pain Interventions: Patient agrees to participate in therapy with current pain level  Persons Present: Spouse    Home Living Situation  Lives With: Spouse/significant other  Type of Home: House  Entry Stairs: 2  Comments: no handrails  In-Home Stairs: 1 flight;Rail on right when ascending  Comments: bedroom upstairs  Bathroom Setup: Tub only;Walk in shower  Comments: Tub on upstairs level and shower in downstairs  Patient Owned Equipment: Bathing: Shower chair;Cane: Single point;Walker with wheels;Wheelchair    Prior Level of Function  Level Of Independence: Independent with ADL and community mobility without device;Independent with ADL and community mobility with endurance limitations  History of Falls in Past 3 Months: No    Precautions  Comments: r/u respiratory infections    ROM  R UE ROM: WFL   R UE ROM Method: Active  L UE ROM: WFL   L UE ROM Method: Active  R LE ROM: WFL  R LE ROM Method: Active  L LE ROM: WFL  L LE ROM Method: Active assisted    Strength  Strength Position Assessed: Seated  R LE Strength: WFL  L LE Strength: WFL    Bed Mobility/Transfer  Bed Mobility: Supine to Sit: Modified Independent;Use of Rail  Bed Mobility: Sit to Supine: Standby Assist;Bed Flat;Use of Rail  Transfer Type: Sit to Stand  Transfer: Assistance Level: To/From;Bed;Bed Side Chair;Standby Assist  Transfer: Assistive Device: None  Transfers: Type Of Assistance: Verbal Cues  End Of Activity Status: In Bed;Instructed Patient to Request Assist with Mobility;Instructed Patient to Use Call Light    Gait  Gait Distance: 150 feet  Gait: Assistance Level: Standby Assist  Gait: Assistive Device: IV Pole  Gait: Descriptors: Pace: Futures trader;No balance loss;Normal step length  Comments: Slow gait pattern with use of I pole; patient requested use of handheld support due to fatigue  Stairs: Number Climbed: 10  Stairs: Descriptors: Ascend;Descend;Non-Reciprocal  Stairs: Assistance Level: Minimal Assist (CGA)  Stairs: Assistive Device: One Rail  Activity Limited By: Complaint of Fatigue;Lines/Leads  Comments: Patient was able to negotiate 10 step ups on 6 curbstep with use of R handrail    Education  Persons Educated: Patient  Patient Barriers To Learning: None Noted  Teaching Methods: Verbal Instruction  Patient Response: Verbalized Understanding  Topics: Plan/Goals of PT Interventions;Use of Assistive Device/Orthosis;Mobility Progression;Safety Awareness;Importance of Increasing Activity;Recommend Continued Therapy    Assessment/Progress  Impaired  Mobility Due To: Decreased Activity Tolerance;Medical Status Limitation  Assessment/Progress: Should Improve w/ Continued PT    AM-PAC 6 Clicks Basic Mobility Inpatient  Turning from your back to your side while in a flat bed without using bed rails: None  Moving from lying on your back to sitting on the side of a flat bed without using bedrails : None  Moving to and from a bed to a chair (including a wheelchair): None  Standing up from a chair using your arms (e.g. wheelchair, or bedside chair): None  To walk in hospital room: None  Climbing 3-5 steps with a railing: A Little  Basic Mobility Inpatient Raw Score: 23  Standardized (T-scale) Score: 50.88  Mobility Goal [facility]: JH-HLM 7 Walk >= 25 ft    Goals  Goal Formulation: With Patient/Family  Time For Goal Achievement: 3 days  Patient Will Go Supine To/From Sit: Independently  Patient Will Transfer Bed/Chair: Independently  Patient Will Transfer Sit to Stand: Independently  Patient Will Ambulate: Greater than 200 Feet, w/ No Device, Independently  Patient Will Go Up / Down Stairs: 1-2 Flights, w/ Stand By Assist    Plan  Treatment Interventions: Mobility training;Endurance training  Plan Frequency: 1-2 Days per Week  PT Plan for Next Visit: progress gait distance and activity tolerance; trial consecutive steps once cleared for hallway mobility    PT Discharge Recommendations  Recommendation: Home with intermittent supervision/assistance  Recommendation for Therapy Post Discharge: Outpatient  Patient Currently Requires Physical Assist With: In and out of house;All home functioning ADLs  Patient Currently Requires Supervision For: Mobility  Patient Currently Requires Equipment: Owns what is needed    Therapist  Ozell Evans, PT  Date  08/23/2023

## 2023-08-23 NOTE — Progress Notes
 OCCUPATIONAL THERAPY  NOTE   Name: Vicki Buchanan   MRN: [id number]     DOB: 11-06-1955      Age: 68 y.o.  Admission Date: 08/20/2023     LOS: 3 days     Date of Service: 08/23/2023      Based on discussion with PT, patient's current level of function suggests that patient will progress with one discipline. OT will sign off at this time. Please re-consult if a change in functional status occurs.     Therapist: Duwaine Lang, OTR/L [phone]  Date: 08/23/2023

## 2023-08-23 NOTE — Progress Notes
 RT Adult Assessment Note    NAME:Vicki Buchanan             MRN: [id number]             DOB:[birth year]          AGE: 67 y.o.  ADMISSION DATE: 08/20/2023             DAYS ADMITTED: LOS: 3 days    Additional Comments:  Impressions of the patient: Upon observation, patient is resting comfortable in bed on RA. No shortness of air noted or endorsed by the patient. Secretions are minimal. No respiratory complaints at this time.    Vital Signs:  Pulse:    RR:    SpO2:    O2 Device: None (Room air)  Liter Flow:    O2%:      Breath Sounds:      Respiratory Effort:   Respiratory WDL: Within Defined Limits  Comments:

## 2023-08-23 NOTE — Response Teams
 Rapid Response Team Progress Note    Date: 08/23/2023 Time: 6:48 PM  Patient: Vicki Buchanan  Attending: Roderic Dy, [facility]* Service: Med Private [id number]  Admission Date: 08/20/2023  LOS: 3 days    A Code/Rapid Response Timeline Event Report has been created for this patient on 08/23/23 at 1827.  The patient was hypotensive and hypothermic.  We gave 1 L bolus of fluids and the pt was normothermic upon arrival after interventions from the bedside RN.      The Attending physician, Dr. Zafar, was present at this event.    Luci Che, RN

## 2023-08-24 LAB — MANUAL DIFF
~~LOC~~ BKR BANDS - RELATIVE: 1 % (ref 0–10)
~~LOC~~ BKR EOSINOPHILS - RELATIVE: 6 % — ABNORMAL HIGH (ref 0–5)
~~LOC~~ BKR LYMPHOCYTES - RELATIVE: 12 % — ABNORMAL LOW (ref 24–44)
~~LOC~~ BKR MONOCYTES - RELATIVE: 9 % (ref 4–12)
~~LOC~~ BKR NEUT+BANDS - ABSOLUTE: 10 10*3/uL — ABNORMAL HIGH (ref 1.8–7.0)
~~LOC~~ BKR NEUTROPHILS - RELATIVE: 72 % (ref 41–77)

## 2023-08-24 LAB — BLOOD GASES, CENTRAL VENOUS
~~LOC~~ BKR BASE DEFICIT-CENTRAL VENOUS: 4 mmol/L
~~LOC~~ BKR BICARB-CENTRAL VENOUS: 20 mmol/L
~~LOC~~ BKR FIO2 VALUE-CENTRAL VENOUS: 21 %
~~LOC~~ BKR O2 SAT, CENTRAL VENOUS: 79 %
~~LOC~~ BKR PCO2-CENTRAL VENOUS: 45 mmHg (ref 36–50)
~~LOC~~ BKR PH-CENTRAL VENOUS: 7.3 (ref 7.30–7.40)
~~LOC~~ BKR PO2-CENTRAL VENOUS: 50 mmHg (ref 40–50)

## 2023-08-24 LAB — COMPREHENSIVE METABOLIC PANEL
~~LOC~~ BKR ALBUMIN: 2.6 g/dL — ABNORMAL LOW (ref 3.5–5.0)
~~LOC~~ BKR ALK PHOSPHATASE: 312 U/L — ABNORMAL HIGH (ref 25–110)
~~LOC~~ BKR ALT: 78 U/L — ABNORMAL HIGH (ref 7–56)
~~LOC~~ BKR ALT: 74 U/L — ABNORMAL HIGH (ref 7–56)
~~LOC~~ BKR ANION GAP: 15 — ABNORMAL HIGH (ref 3–12)
~~LOC~~ BKR ANION GAP: 13 — ABNORMAL HIGH (ref 3–12)
~~LOC~~ BKR AST: 103 U/L — ABNORMAL HIGH (ref 7–40)
~~LOC~~ BKR AST: 95 U/L — ABNORMAL HIGH (ref 7–40)
~~LOC~~ BKR CALCIUM: 8 mg/dL — ABNORMAL LOW (ref 8.5–10.6)
~~LOC~~ BKR CHLORIDE: 102 mmol/L (ref 98–110)
~~LOC~~ BKR CHLORIDE: 102 mmol/L — ABNORMAL LOW (ref 98–110)
~~LOC~~ BKR CO2: 20 mmol/L — ABNORMAL LOW (ref 21–30)
~~LOC~~ BKR CO2: 22 mmol/L (ref 21–30)
~~LOC~~ BKR GLOMERULAR FILTRATION RATE (GFR): 15 mL/min — ABNORMAL LOW (ref >60)
~~LOC~~ BKR GLOMERULAR FILTRATION RATE (GFR): 16 mL/min — ABNORMAL LOW (ref >60)
~~LOC~~ BKR POTASSIUM: 3.6 mmol/L — ABNORMAL LOW (ref 3.5–5.1)
~~LOC~~ BKR POTASSIUM: 3.5 mmol/L — ABNORMAL LOW (ref 3.5–5.1)
~~LOC~~ BKR SODIUM, SERUM: 137 mmol/L — ABNORMAL LOW (ref 137–147)
~~LOC~~ BKR SODIUM, SERUM: 137 mmol/L — ABNORMAL LOW (ref 137–147)
~~LOC~~ BKR TOTAL BILIRUBIN: 15 mg/dL — ABNORMAL HIGH (ref 0.2–1.3)
~~LOC~~ BKR TOTAL PROTEIN: 5.7 g/dL — ABNORMAL LOW (ref 6.0–8.0)

## 2023-08-24 LAB — MAGNESIUM: ~~LOC~~ BKR MAGNESIUM: 1.8 mg/dL (ref 1.6–2.6)

## 2023-08-24 LAB — ECG 12-LEAD
P AXIS: 44 degrees — ABNORMAL HIGH (ref 0.2–1.3)
P-R INTERVAL: 182 ms — ABNORMAL HIGH (ref 32.0–36.0)
Q-T INTERVAL: 506 ms — ABNORMAL HIGH (ref 150–400)
QRS DURATION: 140 ms — ABNORMAL HIGH (ref 11.0–15.0)
QTC CALCULATION (BAZETT): 500 ms — ABNORMAL LOW (ref 7.0–11.0)
R AXIS: -58 degrees — ABNORMAL LOW (ref 3.5–5.0)
T AXIS: -21 degrees — ABNORMAL HIGH (ref 25–110)
VENTRICULAR RATE: 59 {beats}/min — ABNORMAL HIGH (ref 26.0–34.0)

## 2023-08-24 LAB — PROTIME INR (PT)
~~LOC~~ BKR INR: 1.2 10*3/uL — ABNORMAL HIGH (ref 0.9–1.2)
~~LOC~~ BKR PROTIME: 13 s — ABNORMAL LOW (ref 9.9–14.2)

## 2023-08-24 LAB — CBC AND DIFF
~~LOC~~ BKR MCH: 32 pg — ABNORMAL HIGH (ref 26.0–34.0)
~~LOC~~ BKR MCHC: 34 g/dL — ABNORMAL HIGH (ref 32.0–36.0)

## 2023-08-24 LAB — PHOSPHORUS
~~LOC~~ BKR PHOSPHORUS: 4.3 mg/dL (ref 2.0–4.5)
~~LOC~~ BKR PHOSPHORUS: 4 mg/dL — ABNORMAL HIGH (ref 2.0–4.5)

## 2023-08-24 LAB — POC GLUCOSE: ~~LOC~~ BKR POC GLUCOSE: 122 mg/dL — ABNORMAL HIGH (ref 70–100)

## 2023-08-24 LAB — CBC
~~LOC~~ BKR RBC COUNT: 2.3 10*6/uL — ABNORMAL LOW (ref 4.00–5.00)
~~LOC~~ BKR WBC COUNT: 13 10*3/uL — ABNORMAL HIGH (ref 4.50–11.00)

## 2023-08-24 MED ORDER — ALUM-MAG HYDROXIDE-SIMETH 200-200-20 MG/5 ML PO SUSP
30 mL | ORAL | 0 refills | Status: DC | PRN
Start: 2023-08-24 — End: 2023-09-08
  Administered 2023-08-24 – 2023-09-01 (×7): 30 mL via ORAL

## 2023-08-24 MED ORDER — LIDOCAINE HCL 2 % MM SOLN
15 mL | Freq: Once | ORAL | 0 refills | Status: AC
Start: 2023-08-24 — End: ?

## 2023-08-24 MED ORDER — ALBUMIN, HUMAN 5 % IV SOLP
500 mL | Freq: Once | INTRAVENOUS | 0 refills | Status: CP
Start: 2023-08-24 — End: ?
  Administered 2023-08-24 (×2): 500 mL via INTRAVENOUS

## 2023-08-24 NOTE — Case Management (ED)
 Case Management Progress Note    NAME:Vicki Buchanan                          MRN: [id number]              DOB:08/27/1955          AGE: 68 y.o.  ADMISSION DATE: 08/20/2023             DAYS ADMITTED: LOS: 4 days      Today's Date: 08/24/2023    PLAN: anticipate dc home when medically stable     Expected Discharge Date: 08/26/2023   Is Patient Medically Stable: No, Please explain: possible biliary drain placement   Are there Barriers to Discharge? no    INTERVENTION/DISPOSITION:  Discharge Planning              Discharge Planning: Outpatient Rehabilitation   PT consulted, recommending OP PT at time of dc. NCM tasked CMA to add OP PT centers to AVS and informed MPY provider of need for OP PT rx at time of dc.    Family to provide transportation.    No other CM needs identified at this time, will continue to follow and remain available for dc planning.   Transportation              Does the Patient Need Case Management to Arrange Discharge Transport? (ex: facility, ambulance, wheelchair/stretcher, Medicaid, cab, other): No  Will the Patient Use Family Transport?: Yes  Transportation Name, Phone and Availability #1: Husband [name] [phone]  Support              Support: Huddle/team update   NCM reviewed EMR and attended [facility] huddle.    Per [facility] provider, pt is not medically stable for dc today. Pt had low BP last night and RRT was called. Pt may need biliary drain placement.   Info or Referral                 Positive SDOH Domains and Potential Barriers                   Medication Needs              Medication Needs: No Needs Identified      Financial              Financial: No Needs Identified  Legal              Legal: No Needs Identified  Other                 Discharge Disposition                           Rhoda Jasmine, BSN, RN   Nurse Case Manager   [phone]  Available on Voalte

## 2023-08-24 NOTE — Progress Notes
 General Progress Note    Name:  Vicki Buchanan   Today's Date:  08/24/2023  Admission Date: 08/20/2023  LOS: 4 days                     Assessment/Plan:    Principal Problem:    Cholangitis (CMS-HCC)  Active Problems:    Peritoneal carcinomatosis (CMS-HCC)    Cancer of appendix (CMS-HCC)    Hyperbilirubinemia    Mucinous adenocarcinoma of appendix   - Oncologist: Dr. Jameson  - Current Treatment: 5FU + Irinotecan + Avastin   - 4/9: completed C39 Avastin , C41 5Fu  + Irinotecan   - 6/16: C42 planned  - 6/2: CEA 2565.5, up from 1341.3 on 4/9  - 6/5: CT a/p WO- Development of small left pleural effusion and minimal basilar   bronchiolitis. No small bowel obstruction, ascites, or free air. Redemonstration of extensive partially calcified peritoneal metastatic disease. New small subcutaneous nodule right lateral pelvis which is probably a new metastasis.      Hyperbilirubinemia, elevated LFTs  in setting of malignant biliary obstruction   Suspected ascending cholangitis  - Total bilirubin 15.8 on 5/27 25-17.2 on 08/20/2023  - 5/29: stent exchange  - 08/21/23: ERCP: A 10 mm x 8 cm viabil stent was deployed within the previously placed uncovered biliary stent with good outflow of contrast.   - 6/2: blood cx NTD  - 6/2: urine cx NTD  - 6/5: TB 16.9  - ABX: Zosyn      Post ERCP pancreatitis     CKD stage 4  - 6/5: creatinine 3.17  - 6/6: creatinine 3.02    Hypotension  - 6/6: RR for lethargy, hypotension  - s/p albumin , IVF     Recommendations - Plan:  - No plan for inpatient systemic cancer treatment  - infection work up ongoing, trending bilirubin levels  - continue to maximize supportive care measures  - Follow up with Dr. Jameson- appt requested  ________________________________________________________________________    Subjective  Vicki Buchanan is a 68 y.o. female.  Patient rapid responded last night dur to hypotension and lethargy. She feels better this am after getting fluids. No fever or chills. No current pain. Medications  Scheduled Meds:cyanocobalamin  (vitamin B-12) (RUBRAMIN  PC) injection 1,000 mcg, 1,000 mcg, Intramuscular, QDAY  ERGOcalciferoL  (vitamin D2) (DRISDOL ) capsule 50,000 Units, 50,000 Units, Oral, Q7 Days  heparin  (porcine) PF syringe 5,000 Units, 5,000 Units, Subcutaneous, Q8H  pantoprazole  (PROTONIX ) injection 40 mg, 40 mg, Intravenous, QDAY  piperacillin /tazobactam (ZOSYN ) 4.5 g in sodium chloride  0.9% (NS) 100 mL IVPB (MB+), 4.5 g, Intravenous, Q12H*    Continuous Infusions:   lactated ringers  infusion 150 mL/hr at 08/24/23 0621     PRN and Respiratory Meds:acetaminophen  Q6H PRN, alteplase  BID PRN, calcium  carbonate Q6H PRN, cetirizine  QDAY PRN, diphenoxylate -atropine  QID PRN, fentaNYL  citrate PF Q3H while awake PRN, hydrOXYzine  HCL Q6H PRN, labetalol  (NORMODYNE ; TRANDATE ) injection Q4H PRN, loperamide  QID PRN, melatonin QHS PRN, ondansetron  Q6H PRN **OR** ondansetron  (ZOFRAN ) IV Q6H PRN, oxyCODONE  Q4H PRN, polyethylene glycol 3350  QDAY PRN, sennosides-docusate sodium  QDAY PRN, trimethobenzamide  Q6H PRN      Review of Systems:  A 14 point review of systems was negative except for: Constitutional: positive for fatigue and malaise    Objective:                          Vital Signs: Last Filed  Vital Signs: 24 Hour Range   BP: 94/58 (06/06 0823)  Temp: 36.3 ?C (97.4 ?F) (06/06 0802)  Pulse: 54 (06/06 0802)  Respirations: 16 PER MINUTE (06/06 0802)  SpO2: 96 % (06/06 0802)  O2 Device: None (Room air) (06/06 0802) BP: (70-94)/(34-60)   Temp:  [35.2 ?C (95.4 ?F)-36.4 ?C (97.5 ?F)]   Pulse:  [48-62]   Respirations:  [11 PER MINUTE-26 PER MINUTE]   SpO2:  [96 %-100 %]   O2 Device: None (Room air)     Vitals:    08/22/23 1143 08/22/23 1503 08/24/23 0557   Weight: 51.3 kg (113 lb) 51.3 kg (113 lb) 58.7 kg (129 lb 6.4 oz)         Intake/Output Summary:  (Last 24 hours)    Intake/Output Summary (Last 24 hours) at 08/24/2023 9164  Last data filed at 08/24/2023 9378  Gross per 24 hour   Intake 5052 ml Output 360 ml   Net 4692 ml           Physical Exam  General:  Alert, cooperative, no distress, appears stated age  Head:  Normocephalic, without obvious abnormality, atraumatic  Lungs:  non-labored  Skin: + jaundice  Neurologic:   no focal deficits    Lab Review  24-hour labs:    Results for orders placed or performed during the hospital encounter of 08/20/23 (from the past 24 hours)   BASIC METABOLIC PANEL    Collection Time: 08/23/23  4:42 PM   Result Value Ref Range    Sodium 135 (L) 137 - 147 mmol/L    Potassium 4.3 3.5 - 5.1 mmol/L    Chloride 101 98 - 110 mmol/L    Glucose 146 (H) 70 - 100 mg/dL    Blood Urea Nitrogen 51 (H) 7 - 25 mg/dL    Creatinine 6.69 (H) 0.40 - 1.00 mg/dL    Calcium  7.7 (L) 8.5 - 10.6 mg/dL    CO2 21 21 - 30 mmol/L    Anion Gap 13 (H) 3 - 12    Glomerular Filtration Rate (GFR) 15 (L) >60 mL/min   CBC    Collection Time: 08/23/23  6:40 PM   Result Value Ref Range    White Blood Cells 13.20 (H) 4.50 - 11.00 10*3/uL    Red Blood Cells 2.31 (L) 4.00 - 5.00 10*6/uL    Hemoglobin 7.6 (L) 12.0 - 15.0 g/dL    Hematocrit 78.4 (L) 36.0 - 45.0 %    MCV 93.0 80.0 - 100.0 fL    MCH 32.9 26.0 - 34.0 pg    MCHC 35.4 32.0 - 36.0 g/dL    RDW 78.1 (H) 88.9 - 15.0 %    Platelet Count 466 (H) 150 - 400 10*3/uL    MPV 9.6 7.0 - 11.0 fL   POC LACTATE    Collection Time: 08/23/23  6:40 PM   Result Value Ref Range    LACTIC ACID POC 1.8 0.5 - 2.0 mmol/L   COMPREHENSIVE METABOLIC PANEL    Collection Time: 08/23/23  8:55 PM   Result Value Ref Range    Sodium 137 137 - 147 mmol/L    Potassium 3.6 3.5 - 5.1 mmol/L    Chloride 102 98 - 110 mmol/L    Glucose 123 (H) 70 - 100 mg/dL    Blood Urea Nitrogen 53 (H) 7 - 25 mg/dL    Creatinine 6.78 (H) 0.40 - 1.00 mg/dL    Calcium  8.2 (L) 8.5 - 10.6 mg/dL  Total Protein 5.6 (L) 6.0 - 8.0 g/dL    Total Bilirubin 83.4 (H) 0.2 - 1.3 mg/dL    Albumin  2.4 (L) 3.5 - 5.0 g/dL    Alk Phosphatase 660 (H) 25 - 110 U/L    AST 103 (H) 7 - 40 U/L    ALT 78 (H) 7 - 56 U/L    CO2 20 (L) 21 - 30 mmol/L    Anion Gap 15 (H) 3 - 12    Glomerular Filtration Rate (GFR) 15 (L) >60 mL/min   MAGNESIUM     Collection Time: 08/23/23  8:55 PM   Result Value Ref Range    Magnesium  1.8 1.6 - 2.6 mg/dL   PHOSPHORUS    Collection Time: 08/23/23  8:55 PM   Result Value Ref Range    Phosphorus 4.3 2.0 - 4.5 mg/dL   POC GLUCOSE    Collection Time: 08/23/23  9:08 PM   Result Value Ref Range    Glucose, POC 122 (H) 70 - 100 mg/dL   HIGH SENSITIVITY TROPONIN I, RANDOM    Collection Time: 08/24/23 12:44 AM   Result Value Ref Range    hs Troponin I, Random 17 (H) <15 ng/L   LACTIC ACID (BG - RAPID LACTATE)    Collection Time: 08/24/23 12:44 AM   Result Value Ref Range    Lactic Acid,BG 1.9 0.5 - 2.0 mmol/L   BLOOD GASES, CENTRAL VENOUS    Collection Time: 08/24/23  1:29 AM   Result Value Ref Range    PCO2 45 36 - 50 mmHg    PO2-Central Venous 50 40 - 50 mmHg    O2 Sat, Central Venous 79.1 %    PH-Central Venous 7.30 7.30 - 7.40    Base Deficit-Central Venous 4.0 mmol/L    Bicarb-Central Venous 20.8 mmol/L    FiO2 Value Central Venous 21 %   PROTIME INR (PT)    Collection Time: 08/24/23  1:50 AM   Result Value Ref Range    Protime 13.3 9.9 - 14.2 Seconds    INR 1.2 0.9 - 1.2   CBC AND DIFF    Collection Time: 08/24/23  1:50 AM   Result Value Ref Range    White Blood Cells 14.90 (H) 4.50 - 11.00 10*3/uL    Red Blood Cells 2.22 (L) 4.00 - 5.00 10*6/uL    Hemoglobin 7.2 (L) 12.0 - 15.0 g/dL    Hematocrit 79.0 (L) 36.0 - 45.0 %    MCV 94.0 80.0 - 100.0 fL    MCH 32.3 26.0 - 34.0 pg    MCHC 34.4 32.0 - 36.0 g/dL    RDW 78.5 (H) 88.9 - 15.0 %    Platelet Count 206 150 - 400 10*3/uL    MPV 9.1 7.0 - 11.0 fL   MANUAL DIFF    Collection Time: 08/24/23  1:50 AM   Result Value Ref Range    ANISO Present     HYPO Present     Target Present     Platelet Estimate Normal     Segmented Neutrophils 72 41 - 77 %    Lymphocytes 12 (L) 24 - 44 %    Monocytes 9 4 - 12 %    Eosinophil 6 (H) 0 - 5 %    Bands 1 0 - 10 %    Absolute Neutrophil Count Manual 10.9 (H) 1.8 - 7.0 10*3/uL   COMPREHENSIVE METABOLIC PANEL    Collection Time: 08/24/23  1:51 AM   Result  Value Ref Range    Sodium 137 137 - 147 mmol/L    Potassium 3.5 3.5 - 5.1 mmol/L    Chloride 102 98 - 110 mmol/L    Glucose 106 (H) 70 - 100 mg/dL    Blood Urea Nitrogen 50 (H) 7 - 25 mg/dL    Creatinine 6.97 (H) 0.40 - 1.00 mg/dL    Calcium  8.0 (L) 8.5 - 10.6 mg/dL    Total Protein 5.7 (L) 6.0 - 8.0 g/dL    Total Bilirubin 84.3 (H) 0.2 - 1.3 mg/dL    Albumin  2.6 (L) 3.5 - 5.0 g/dL    Alk Phosphatase 687 (H) 25 - 110 U/L    AST 95 (H) 7 - 40 U/L    ALT 74 (H) 7 - 56 U/L    CO2 22 21 - 30 mmol/L    Anion Gap 13 (H) 3 - 12    Glomerular Filtration Rate (GFR) 16 (L) >60 mL/min   MAGNESIUM     Collection Time: 08/24/23  1:51 AM   Result Value Ref Range    Magnesium  1.7 1.6 - 2.6 mg/dL   PHOSPHORUS    Collection Time: 08/24/23  1:51 AM   Result Value Ref Range    Phosphorus 4.0 2.0 - 4.5 mg/dL       Point of Care Testing  (Last 24 hours)  Glucose: (!) 106 (08/24/23 0151)  POC Glucose (Download): (!) 122 (08/23/23 2108)    Radiology and other Diagnostics Review:    Pertinent radiology reviewed.    Vicki FORBES Li, APRN-NP   [facility]  Pager # [phone]

## 2023-08-24 NOTE — Progress Notes
 Infectious Diseases Progress Note    Today's Date:  08/24/2023  Admission Date: 08/20/2023  LOS: 4 days    Reason for this consultation: Cholangitis     Assessment:     Cholestatic jaundice due to malignant obstruction without evidence of ascending cholangitis  Pancreatitis, chemical post ERCP  08/03/2023 admitted to [facility] with acute cholangitis  Started Zosyn   08/04/2023 urine culture: >100K CFU Enterococcus faecalis (S) ampicillin   08/06/2023 underwent ERCP which revealed indwelling uncovered metal biliary stent in the left hepatic duct which was exchanged with plastic stent, mild stenosis within the metal stent at the proximal main bile duct, diffuse malignant stenoses of intrahepatic ducts; presence of purulence  08/07/2023 discontinued Zosyn ; started Augmentin ; discharged home  08/16/2023 underwent ERCP which revealed a protruding indwelling metallic stent in CBD seen coming out from ampulla with a plastic stent inside the metal stent, plastic stent was removed, occlusion cholangiogram: right posterior intrahepatic duct opacified; left hepatic system not opacified and totally; right anterior intrahepatic with branches partially opacified with significant narrowing beyond indwelling stent up to the hilum of liver; palced a Habiba catheter size 8 French, 2.7 and RFA x 2; large amount of tumor ingrowth inside the indwelling stent with large amount of tissue growing at the entrance of the stent which again this area also tumor destruction applied using RFA x 2; placed a 10 French, 10 cm double-pigtail plastic stent was successfully placed into the right intrahepatic duct; good outflow of bile and contrast seen through both the stents  08/20/2023 seen in oncology clinic [facility], noted to have persistent jaundice status post ERP and stent placement, received cefepime  x 1 dose  Directly admitted to the hospitalist service  Blood cultures: No growth x 2 sets  Urine culture: No growth  Started Zosyn   Underwent ERCP which revealed uncovered metal stent in CBD extending out of ampulla with a double-pigtail plastic stent through the lumen of the uncovered metal stent, double-pigtail plastic stent removed, stone extraction balloon passed into left intrahepatic duct, balloon sweeps with extraction of a large amount of sludge, food debris and stent debris; occlusion cholangiogram velum narrowing of the stent midportion consistent with tumor ingrowth; palced 10 mm x 8 cm VIABIL stent within the previously placed uncovered biliary stent, upper end of the VIABIL stent was below the hilar confluence, good outflow of contrast after stent deployment   08/23/2023 CT ABD/pelvis (personally viewed on PACS): Development of small left pleural effusion and minimal basilar   bronchiolitis. No small bowel obstruction, ascites, or free air. Redemonstration of extensive partially calcified peritoneal metastatic disease. New small subcutaneous nodule right lateral pelvis which is probably a new metastasis. Right ischial pressure ulcer.      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 with subsequent removal of ureteral stents  02/2017 diagnosed on laparoscopy  04/03/2017 underwent loop ileostomy creation  12/2017 started epacadosta and sirolimus   05/2018 discontinued epacadosta and sirolimus   05/27/2020 underwent laparoscopic HIPEC with mitomycin-C (trial at [facility])  10/07/2018 underwent second laparoscopic HIPEC  09/12/2018 underwent tumor debulking [facility])   05/2020 underwent radiation therapy to right lung metastases  04/16/2020 underwent cytoreductive surgery with ostomy revision due to tumor invasion  09/2020 started FOLFIRI and bevacizumab    09/07/2021 MRI/MRCP: Slight apparent increase in diffuse intrahepatic and extrahepatic biliary ductal dilatation to the level of the porta hepatis where there is   extrinsic  compression on the extrahepatic common duct from metastases. No   biliary filling defect to suggest choledocholithiasis or biliary mass. Grossly unchanged extensive peritoneal carcinomatosis and right abdominal wall metastases. Partially visualized indeterminate lesion in the left sacrum, which may   be a metastasis or hemangioma.   09/16/2021 underwent ERCP this month of an uncovered metal stent in the CBD  09/2021 held bevacizumab    08/04/2023 CT ABD/pelvis: Extensive metastatic disease throughout the abdomen and pelvis including peritoneal implants with mass effect on adjacent structures. Left posterior pleural implant with invasion of the chest wall and adjacent smaller nodules. Common bile duct and biliary stent in place. Moderate to intrahepatic biliary ductal dilatation. Right lower quadrant ostomy encased with implants. No gas-filled dilated loops of bowel to suggest obstruction. Mild left hydronephrosis with prominent left extrarenal pelvis. Diffuse bladder wall thickening with adjacent fat stranding, possibly secondary to acute cystitis. Correlation with urinalysis is recommended. Distended vaginal cuff with air-fluid level. The posterior wall of the urinary bladder is adjacent to the vaginal cuff without preserved fat plane of separation.   08/20/2023 CEA 2565.5  Currently FOLFIRI cycle 40     CKD, stage IV     Recurrent urinary tract infections     Pelvic abscess following tumor debulking  10/2018 abscess cultures: Serratia and Candida albicans  Underwent IR abscess drain placement  Received course of cefepime  and fluconazole      Asplenia, surgical  02/2018 Pneumovax   09/2018 PCV13 (Prevnar)   09/2018 Haemophilus influenzae type B   Meningococcal conjugate vaccine (Menveo) 09/30/2018 and 11/2018  Meningococcal serogroup B vaccine (MenB) 09/30/2018 and 10/28/2018      Recommendations:   Discontinue Zosyn .  Observe OFF of antibiotics.  Await blood cultures.  Await urine culture.  Appreciate gastroenterology's assistance.  Appreciate medical oncology input.  Discussed with the patient and her husband at the bedside.  All questions answered to their understanding.  Prognosis is guarded.  Infectious diseases will sign OFF.  Please contact us  with questions or concerns.    Today's encounter included some or all of the following:  Disease Transmission Risk Assessment and Mitigation: Developing, following, and supervising specialized, individualized infection control protocols for an individual patient based on their diagnosis and risks in order to reduce risk of disease transmission. Coordinating with human resources regarding infection prevention and control measures to enable healthcare facility staff to safely care for patient. Counseling patients, family members and caregivers regarding infection prevention. Managing infection prevention and treatment protocols associated with transitions of care for complex patients.   Public Health Investigation, analysis, and testing: In-depth patient chart review that entails going back farther in time and assessing the complete breadth of all health care interactions, with higher-level synthesis for complex diagnoses. Communicating with the clinical microbiology lab and directly reviewing specimens. Coordinating specialized diagnostic evaluations (for example, identifying and facilitating diagnostic laboratory tests only available at specialized laboratories, the state health department, and/or the CDC). Coordinating with Federal, State and local public health agencies and laboratories to assist with contact tracing, obtaining specimens for specialized testing, and/or identifying prior testing and treatment for communicable diseases in other jurisdictions.   Complex Antimicrobial Therapy Counseling & Treatment: Counseling patients, family members, and caregivers regarding antimicrobial stewardship and resistance for the patient.   Engaging in complex medical decision-making associated with antimicrobial prescribing including considerations such as antimicrobial resistance patterns, emergence of new variants/strains, recent antibiotic exposure, interactions/complications from comorbidities including concurrent infections, public health considerations to minimize development of antimicrobial resistance, and emerging  and re-emerging infections.    Thank you for the consultation.     Adina Bouche, DO  Associate Professor  Division of Infectious Diseases    History of Present Illness    Vicki Buchanan is a 68 y.o.  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      Denies fevers or chills.  Denies nausea, vomiting, or diarrhea.  Denies abdominal pain.  Appetite is fair.  Did not sleep well last night.  Had some episodes of asymptomatic hypotension that is altered and an RRT and required frequent remonitoring.        Antimicrobial Start date End date                                          Medications     Current Facility-Administered Medications:     acetaminophen  (TYLENOL  EXTRA STRENGTH) tablet 500 mg, 500 mg, Oral, Q6H PRN, Doan, Hieu M, MD    alteplase  (CATHFLO ACTIVASE ) injection 2 mg, 2 mg, Intra-catheter, BID PRN, Doan, Hieu M, MD    alum-mag hydroxide-simeth (MYLANTA) oral suspension 30 mL, 30 mL, Oral, Q4H PRN **AND** lidocaine  hcl viscous 2 % solution 15 mL, 15 mL, Oral, ONCE, Poulose Redger, Leeroy HERO, MD    calcium  carbonate (TUMS) chew tablet 500 mg, 500 mg, Oral, Q6H PRN, Doan, Hieu M, MD, 500 mg at 08/21/23 1453    cetirizine  (ZyrTEC ) tablet 10 mg, 10 mg, Oral, QDAY PRN, Doan, Hieu M, MD    cyanocobalamin  (vitamin B-12) (RUBRAMIN  PC) injection 1,000 mcg, 1,000 mcg, Intramuscular, QDAY, Doan, Hieu M, MD, 1,000 mcg at 08/24/23 0813    diphenoxylate -atropine  (LOMOTIL ) 2.5-0.025 mg tablet 1 tablet, 1 tablet, Oral, QID PRN, Doan, Hieu M, MD    ERGOcalciferoL  (vitamin D2) (DRISDOL ) capsule 50,000 Units, 50,000 Units, Oral, Q7 Days, Mohamad Alahmad, Mohamad Alhoda, MD, 50,000 Units at 08/22/23 0834    fentaNYL  citrate PF (SUBLIMAZE ) injection 25 mcg, 25 mcg, Intravenous, Q3H while awake PRN, Mohamad Alahmad, Mohamad Alhoda, MD, 25 mcg at 08/22/23 0902    heparin  (porcine) PF syringe 5,000 Units, 5,000 Units, Subcutaneous, Q8H, Mohamad Alahmad, Mohamad Alhoda, MD, 5,000 Units at 08/24/23 0602    hydrOXYzine  HCL (ATARAX ) tablet 25 mg, 25 mg, Oral, Q6H PRN, Doan, Lorriane HERO, MD    lactated ringers  infusion, , Intravenous, Continuous, Ali, Zafar, MD, Last Rate: 150 mL/hr at 08/24/23 0621, New Bag at 08/24/23 0621    loperamide  (IMODIUM  A-D) capsule 2 mg, 2 mg, Oral, QID PRN, Doan, Hieu M, MD    melatonin tablet 5 mg, 5 mg, Oral, QHS PRN, Doan, Hieu M, MD, 5 mg at 08/22/23 2200    ondansetron  (ZOFRAN  ODT) rapid dissolve tablet 4 mg, 4 mg, Oral, Q6H PRN, 4 mg at 08/22/23 2024 **OR** ondansetron  HCL (PF) (ZOFRAN  (PF)) injection 4 mg, 4 mg, Intravenous, Q6H PRN, Doan, Hieu M, MD    oxyCODONE  (ROXICODONE ) tablet 5 mg, 5 mg, Oral, Q4H PRN, Mohamad Alahmad, Mohamad Alhoda, MD, 5 mg at 08/23/23 0550    pantoprazole  (PROTONIX ) injection 40 mg, 40 mg, Intravenous, QDAY, Mohamad Alahmad, Mohamad Alhoda, MD, 40 mg at 08/24/23 0813    piperacillin /tazobactam (ZOSYN ) 4.5 g in sodium chloride  0.9% (NS) 100 mL IVPB (MB+), 4.5 g, Intravenous, Q12H*, Doan, Hieu M, MD, Last Rate: 200 mL/hr at  08/23/23 2316, 4.5 g at 08/23/23 2316    polyethylene glycol 3350  (MIRALAX ) packet 17 g, 1 packet, Oral, QDAY PRN, Doan, Hieu M, MD    sennosides-docusate sodium  (SENOKOT-S) tablet 1 tablet, 1 tablet, Oral, QDAY PRN, Doan, Hieu M, MD    trimethobenzamide  (TIGAN ) injection 200 mg, 200 mg, Intramuscular, Q6H PRN, Mohamad Alahmad, Roderic Butcher, MD, 200 mg at 08/22/23 1456      Physical Examination     Vitals:    08/24/23 0810 08/24/23 0812 08/24/23 0823 08/24/23 0946   BP: 90/48 (!) 89/48 94/58 (!) 84/51   BP Source: Arm, Right Upper Arm, Left Upper Arm, Right Upper Arm, Right Upper   Pulse:       Temp:       SpO2:       O2 Device:       Weight:       Height:           General appearance: Alert, oriented x 3, in NAD  HENT: Normocephalic, atraumatic, oropharynx clear, dentition good repair  Eyes: PER, EOM grossly intact, scleral icterus  Neck: Supple, no lymphadenopathy  Heart: Regular rhythm, regular rate  Lungs: Nonlabored respirations  Ext: No clubbing or cyanosis; bilateral lower extremity edema  Skin: No rashes/lesions  Neuro:  CN II-XII grossly intact, no focal deficits     Lines: Left internal jugular Mediport     Drains/tubes: None    Lab Review   Hematology  Recent Labs     08/22/23  0400 08/23/23  0331 08/23/23  1840 08/24/23  0150   WBC 18.80* 18.70* 13.20* 14.90*   HGB 8.4* 8.8* 7.6* 7.2*   HCT 24.4* 25.8* 21.5* 20.9*   PLTCT 273 274 466* 206   INR 1.2 1.2  --  1.2     Chemistry  Recent Labs     08/23/23  0331 08/23/23  1642 08/23/23  2055 08/24/23  0151   NA 139 135* 137 137   K 3.7 4.3 3.6 3.5   CL 103 101 102 102   CO2 21 21 20* 22   BUN 47* 51* 53* 50*   CR 3.17* 3.30* 3.21* 3.02*   GFR 15* 15* 15* 16*   GLU 105* 146* 123* 106*   CA 8.3* 7.7* 8.2* 8.0*   PO4 4.8*  --  4.3 4.0   ALBUMIN  2.7*  --  2.4* 2.6*   ALKPHOS 423*  --  339* 312*   AST 100*  --  103* 95*   ALT 71*  --  78* 74*   TOTBILI 16.9*  --  16.5* 15.6*       Microbiology, Radiology and other Diagnostics Review        Microbiology - Resulted Micro Last 24 Hrs        RVP VIRAL PANEL PCR (Normal)  Resulted: 08/23/23 1142, Result status: Final result     Ordering provider: Roderic Patt Roderic Butcher, MD  08/23/23 [phone] Resulting lab: [facility]   CLIA number: [id number]      Specimen Information      Source Collected On   Nasal 08/23/23 0856              Components      Component Value Flag   Adenovirus Not Detected  --   Coronavirus 229E Not Detected  -- Coronavirus HKU1 Not Detected  --   Coronavirus NL63 Not Detected  --   Coronavirus OC43 Not Detected  --  Human Metapneumovirus Not Detected  --   Human Rhinovirus/Enterovirus Not Detected  --   Influenza A Not Detected  --   Influenza B Virus Not Detected  --   Parainfluenza virus 1 Not Detected  --   Parainfluenza virus 2 Not Detected  --   Parainfluenza virus 3 Not Detected  --   Parainfluenza virus 4 Not Detected  --   RESP SYNCTIAL VIRUS RVP Not Detected  --   Bordetella parapertussis Not Detected  --   Bordetella pertussis PCR Not Detected  --   Chlamydia pneumoniae PCR Not Detected  --   Mycoplasma pneumoniae Not Detected  --   SARS-COV 2 Not Detected  --                  Microbiology data reviewed.    Pertinent radiology images viewed.

## 2023-08-24 NOTE — Progress Notes
 Called to RRT w/ c/f hypotension s/p 3L LR. Most recent SVI 17.6% during 2nd RRT activation. POCUS at bedside technically difficult d/t inability to appreciate IVC, otherwise grossly preserved LV EF and non-dilated RA/RV and w/o obvious valvular abnormalities. Bilateral IJ's easily collapsible despite supine position, suggestive of continued hypovolemia. Given POCUS exam, normal lactate x2 w/o end organ dysfunction, SVI >10%, recent NPO status, pancreatitis, and known home IV fluid replacement regimen, it is felt that she is still hypovolemic at this time, requiring further volume resuscitation.    ROS pertinent for lethargy/fatigue; otherwise negative for chest pain, SOB, dizziness/lightheadedness, visual disturbances, fevers, chills.     General appearance: lethargic, cooperative and no acute distress  Throat: Lips, mucosa, and tongue dry. Teeth and gums normal  Lungs: Clear over decreased breath sounds bilaterally. No rales, rhonchi, or wheeze. Normal effort   Heart: Bradycardic w/ rate 55 via manual count, regular rate and rhythm, S1, S2 normal, no murmur, click, rub or gallop  Abdomen: soft, non-tender, RLQ ileostomy stoma pink w/ minimal brown stool in bag. Bowel sounds normal.   Extremities: extremities normal, atraumatic, no cyanosis or edema  Neurologic: Lethargic and oriented X 3, MAE equally.  Peripheral pulses:  2+ and symmetric  Cap Refill:  < 2 sec  Skin: Jaundiced skin color, texture, turgor normal.    Recommendations  - Continue to hold anti-hypertensive medications  - Albumin  5% x1  - MAP goal >2mmHg  - Okay to remain on floor at this time, will f/u in 2 hours w/ rapid team    Plan d/w Dr. Sueann and Dr. Missie Chauncey Alderman APRN-NP  Pulmonary/Critical Care  M4 Team Pager [phone]

## 2023-08-24 NOTE — Response Teams
 08/24/2023 0000    At bedside for two hour rapid response follow up. Patient continues to be hypotensive, BP 88/48. Patient is drowsy but awakens easily to voice, is oriented x4 and follows commands. Patient endorses some indigestion but no other complaints. Primary MD Albertha Phillips notified, and per MDs request I notified M4 ICU NP Chauncey Alderman who came to bedside to evaluate the patient. Troponin, lactic acid, and morning labs sent. 500ml albumin  ordered and given. BP improved to 92/50, plan to remain in this level of care. Discussed with Mabry NP and Mishra MD.     0400 - Patients BP 93/60, no current concerns from primary RN.

## 2023-08-25 LAB — COMPREHENSIVE METABOLIC PANEL
~~LOC~~ BKR ALBUMIN: 2.5 g/dL — ABNORMAL LOW (ref 3.5–5.0)
~~LOC~~ BKR ALK PHOSPHATASE: 303 U/L — ABNORMAL HIGH (ref 25–110)
~~LOC~~ BKR ALT: 59 U/L — ABNORMAL HIGH (ref 7–56)
~~LOC~~ BKR ANION GAP: 10 mg/dL (ref 3–12)
~~LOC~~ BKR AST: 84 U/L — ABNORMAL HIGH (ref 7–40)
~~LOC~~ BKR CO2: 22 mmol/L (ref 21–30)
~~LOC~~ BKR GLOMERULAR FILTRATION RATE (GFR): 14 mL/min — ABNORMAL LOW (ref >60)
~~LOC~~ BKR GLUCOSE, RANDOM: 101 mg/dL — ABNORMAL HIGH (ref 70–100)
~~LOC~~ BKR TOTAL BILIRUBIN: 15 mg/dL — ABNORMAL HIGH (ref 0.2–1.3)

## 2023-08-25 LAB — MANUAL DIFF
~~LOC~~ BKR BASOPHILS - RELATIVE: 1 % (ref 0–2)
~~LOC~~ BKR EOSINOPHILS - RELATIVE: 2 % (ref 0–5)
~~LOC~~ BKR LYMPHOCYTES - RELATIVE: 12 % — ABNORMAL LOW (ref 24–44)
~~LOC~~ BKR METAMYELOCYTES - RELATIVE: 2 %
~~LOC~~ BKR MONOCYTES - RELATIVE: 8 % (ref 4–12)
~~LOC~~ BKR MYELOCYTES - RELATIVE: 2 %
~~LOC~~ BKR NEUT+BANDS - ABSOLUTE: 11 10*3/uL — ABNORMAL HIGH (ref 1.8–7.0)
~~LOC~~ BKR NEUTROPHILS - RELATIVE: 73 % (ref 41–77)
~~LOC~~ BKR NUCLEATED RBC MANUAL: 1 10*3/uL

## 2023-08-25 LAB — CBC AND DIFF
~~LOC~~ BKR HEMATOCRIT: 19 % — ABNORMAL LOW (ref 36.0–45.0)
~~LOC~~ BKR HEMOGLOBIN: 6.7 g/dL — ABNORMAL LOW (ref 12.0–15.0)
~~LOC~~ BKR MPV: 9.2 fL — ABNORMAL LOW (ref 7.0–11.0)
~~LOC~~ BKR PLATELET COUNT: 177 10*3/uL — ABNORMAL LOW (ref 150–400)
~~LOC~~ BKR RBC COUNT: 2 10*6/uL — ABNORMAL LOW (ref 4.00–5.00)
~~LOC~~ BKR RDW: 21 % — ABNORMAL HIGH (ref 11.0–15.0)
~~LOC~~ BKR WBC COUNT: 15 10*3/uL — ABNORMAL HIGH (ref 4.50–11.00)

## 2023-08-25 LAB — PHOSPHORUS: ~~LOC~~ BKR PHOSPHORUS: 2.8 mg/dL — ABNORMAL HIGH (ref 2.0–4.5)

## 2023-08-25 LAB — MAGNESIUM: ~~LOC~~ BKR MAGNESIUM: 1.7 mg/dL — ABNORMAL HIGH (ref 1.6–2.6)

## 2023-08-25 LAB — PROTIME INR (PT)
~~LOC~~ BKR INR: 1.2 fL — ABNORMAL LOW (ref 0.9–1.2)
~~LOC~~ BKR PROTIME: 13 s — ABNORMAL LOW (ref 9.9–14.2)

## 2023-08-25 NOTE — Progress Notes
 General Progress Note    Name: Vicki Buchanan        MRN: [id number]          DOB: 06-21-1955            Age: 68 y.o.  Admission Date: 08/20/2023       LOS: 5 days    Date of Service: 08/25/2023    Assessment/Plan:    Assessment & Plan  Cholangitis (CMS-HCC)    Peritoneal carcinomatosis (CMS-HCC)    Cancer of appendix (CMS-HCC)    Hyperbilirubinemia    Ms. 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 She underwent ERCP 08/21/23 with viabil stent placement in the ampulla, complicated by post ERCP pancreatitis.  She has also had hypotension requiring significant IV fluids (she gets 1-2L MWF as an outpatient).      Mucinous adenocarcinoma of the appendix  hyperbilirubinemia  malignant biliary obstruction  ascending cholangitis   post ERCP pancreatitis   - admitted with rising bili, leukocytosis, anemia  - oncology consulted, appreciate assistance  - recently hospitalized at [facility] with pus noted to be coming out of her bile ducts during ERCP 08/06/23  - biliary consulted, s/p ERCP 08/21/23: Uncovered metal stent coming out of the ampulla with double-pigtail plastic stent going through the lumen of the metal stent.  Food and stent debris seen in stent lumen.  Occlusion cholangiogram performed that showed narrowing in the midportion of the stent consistent with tumor/tissue ingrowth.  Right hepatic duct mildly dilated without any obvious stricture.  Left intrahepatic ducts normal-appearing.  A 10 mm x 8 cm viabil stent was deployed within the previously placed uncovered biliary stent with good outflow of contrast.   - CT A/P 6/5:   Development of small left pleural effusion and minimal basilar bronchiolitis.  No small bowel obstruction, ascites, or free air.  Redemonstration of extensive partially calcified peritoneal metastatic disease.   New small subcutaneous nodule right lateral pelvis which is probably a new metastasis.   Right ischial pressure ulcer.   - abdominal pain, elevated lipase post ERCP: post-procedure pancreatitis  - continue aggressive IVF  - monitor LFTs, bilirubin--bili not significantly lower post ERCP, may need further drainage via IR  - continue analgesics  - ID consulted  - continue zosyn  (6/2--6  - blood cultures 6/2 with NGTD     Anemia  > 1 unit prbcs ordered on 6/7.     Hypotension--improved   - Repeated rapid responses 6/5-6/6 for hypotension, received 5L IVF  - Echo 08/22/23: The left ventricular size is normal. The left ventricular wall thickness is normal. Normal geometry.  The left ventricular systolic function is normal. The ejection fraction by Simpson's biplane method is 61%. There are no segmental wall motion abnormalities. Normal left ventricular diastolic function.  The right ventricular size is normal. The right ventricular systolic function is normal.  Nonspecific thickening of the mitral valve with mild to moderate regurgitation, no stenosis.  IVC was not visualized on the study.  No pericardial effusion.  - hold home hydralazine , carvedilol   - stop IVF  - patient gets 1-2L IV fluids MWF as an outpatient, will need to watch her ins/outs     CKD4  chronic mild L hydronephrosis   - follows with Dr. Marlo as outpatient    GERD  - continue PPI  - add PRN GI  cocktail     FEN:   Fluids: stop LR   Monitor and replace electrolytes as needed  Diet: cardiac     DVT PPX: heparin  SQ     CODE STATUS: FULL CODE   NOK: husband, updated at bedside today     Disposition:  continue admission     Today, 08/25/2023, I personally reviewed vitals, labs, imaging, telemetry, nursing notes, and consultant notes.  I also discussed this patient's care with the patient, patient's family, the Child psychotherapist, the case manager, the pharmacist, and the consultant from nephrology.  Furthermore, I ordered labs as part of continued management of Shanessa Hodak.      _______________________________________________________________________    Subjective   Vicki Buchanan is a 68 y.o. female.  Patient hypotension improved. Bili remains stable. Discussed with patient and husband.        Medications  Scheduled Meds:cyanocobalamin  (vitamin B-12) (RUBRAMIN  PC) injection 1,000 mcg, 1,000 mcg, Intramuscular, QDAY  ERGOcalciferoL  (vitamin D2) (DRISDOL ) capsule 50,000 Units, 50,000 Units, Oral, Q7 Days  heparin  (porcine) PF syringe 5,000 Units, 5,000 Units, Subcutaneous, Q8H  pantoprazole  (PROTONIX ) injection 40 mg, 40 mg, Intravenous, QDAY    Continuous Infusions:      PRN and Respiratory Meds:acetaminophen  Q6H PRN, alteplase  BID PRN, alum-mag hydroxide-simeth Q4H PRN **AND** [EXPIRED] lidocaine  hcl viscous ONCE, calcium  carbonate Q6H PRN, cetirizine  QDAY PRN, diphenoxylate -atropine  QID PRN, fentaNYL  citrate PF Q3H while awake PRN, hydrOXYzine  HCL Q6H PRN, loperamide  QID PRN, melatonin QHS PRN, ondansetron  Q6H PRN **OR** ondansetron  (ZOFRAN ) IV Q6H PRN, oxyCODONE  Q4H PRN, polyethylene glycol 3350  QDAY PRN, sennosides-docusate sodium  QDAY PRN, trimethobenzamide  Q6H PRN      Objective                        Vital Signs: Last Filed                 Vital Signs: 24 Hour Range   BP: 128/59 (06/07 1235)  Temp: 36.6 ?C (97.9 ?F) (06/07 1235)  Pulse: 59 (06/07 1235)  Respirations: 16 PER MINUTE (06/07 1235)  SpO2: 97 % (06/07 1235)  O2 Device: None (Room air) (06/07 1235) BP: (100-128)/(53-63)   Temp:  [36.4 ?C (97.5 ?F)-37 ?C (98.6 ?F)]   Pulse:  [56-60]   Respirations:  [16 PER MINUTE-18 PER MINUTE]   SpO2:  [95 %-100 %]   O2 Device: None (Room air)     Vitals:    08/22/23 1143 08/22/23 1503 08/24/23 0557   Weight: 51.3 kg (113 lb) 51.3 kg (113 lb) 58.7 kg (129 lb 6.4 oz)         Intake/Output Summary:  (Last 24 hours)    Intake/Output Summary (Last 24 hours) at 08/25/2023 1316  Last data filed at 08/25/2023 1105  Gross per 24 hour   Intake 100 ml   Output 1200 ml   Net -1100 ml     Stool Occurrence: 0        Physical Exam  General: alert, oriented, no acute distress, appears stated age  Head: normocephalic, atraumatic, no obvious abnormality  Eyes: conjunctivae icteric, PERRL  Nose: nares normal  Throat: normal oropharynx.  Lips and tongue normal.   Lungs: clear to auscultation bilaterally  Heart: normal rate, regular rhythm.  S1 and S2 normal.  No murmur.   Abdomen: soft, non tender, non distended, normoactive bowel sounds.    Extremities: 1+ lower extremity edema  Peripheral pulses: 2+ radial and dorsalis pedis bilaterally   Skin:  dry, jaundiced  Psych: mood good       Lab Review  24-hour labs:    Results for orders placed or performed during the hospital encounter of 08/20/23 (from the past 24 hours)   PROTIME INR (PT)    Collection Time: 08/25/23  5:25 AM   Result Value Ref Range    Protime 13.6 9.9 - 14.2 Seconds    INR 1.2 0.9 - 1.2   CBC AND DIFF    Collection Time: 08/25/23  5:25 AM   Result Value Ref Range    White Blood Cells 15.70 (H) 4.50 - 11.00 10*3/uL    Red Blood Cells 2.05 (L) 4.00 - 5.00 10*6/uL    Hemoglobin 6.7 (L) 12.0 - 15.0 g/dL    Hematocrit 80.5 (L) 36.0 - 45.0 %    MCV 94.6 80.0 - 100.0 fL    MCH 32.5 26.0 - 34.0 pg    MCHC 34.3 32.0 - 36.0 g/dL    RDW 78.6 (H) 88.9 - 15.0 %    Platelet Count 177 150 - 400 10*3/uL    MPV 9.2 7.0 - 11.0 fL   COMPREHENSIVE METABOLIC PANEL    Collection Time: 08/25/23  5:25 AM   Result Value Ref Range    Sodium 136 (L) 137 - 147 mmol/L    Potassium 3.8 3.5 - 5.1 mmol/L    Chloride 104 98 - 110 mmol/L    Glucose 101 (H) 70 - 100 mg/dL    Blood Urea Nitrogen 46 (H) 7 - 25 mg/dL    Creatinine 6.62 (H) 0.40 - 1.00 mg/dL    Calcium  7.4 (L) 8.5 - 10.6 mg/dL    Total Protein 5.2 (L) 6.0 - 8.0 g/dL    Total Bilirubin 84.5 (H) 0.2 - 1.3 mg/dL    Albumin  2.5 (L) 3.5 - 5.0 g/dL    Alk Phosphatase 696 (H) 25 - 110 U/L    AST 84 (H) 7 - 40 U/L    ALT 59 (H) 7 - 56 U/L    CO2 22 21 - 30 mmol/L    Anion Gap 10 3 - 12    Glomerular Filtration Rate (GFR) 14 (L) >60 mL/min   MAGNESIUM     Collection Time: 08/25/23  5:25 AM   Result Value Ref Range    Magnesium  1.7 1.6 - 2.6 mg/dL   PHOSPHORUS    Collection Time: 08/25/23  5:25 AM   Result Value Ref Range    Phosphorus 2.8 2.0 - 4.5 mg/dL   MANUAL DIFF    Collection Time: 08/25/23  5:25 AM   Result Value Ref Range    ANISO Present     POIK Present     Target Present     Platelet Estimate Normal     Segmented Neutrophils 73 41 - 77 %    Lymphocytes 12 (L) 24 - 44 %    Monocytes 8 4 - 12 %    Eosinophil 2 0 - 5 %    Metamyelocyte 2 %    Myelocyte 2 %    Basophil 1 0 - 2 %    Absolute Neutrophil Count Manual 11.5 (H) 1.8 - 7.0 10*3/uL    nRBCs (Manual Diff) 1 10*3/uL   TYPE & CROSSMATCH    Collection Time: 08/25/23 10:59 AM   Result Value Ref Range    ABO/RH(D) O POS     Antibody Screen NEG     Crossmatch Expires 08/28/2023,2359     Units Ordered 1  Electronic Crossmatch YES     Record Check FOUND    PREPARE RBC - SUNQUEST    Collection Time: 08/25/23 10:59 AM   Result Value Ref Range    Units Ordered 1     Crossmatch Expires 08/28/2023,2359     Record Check FOUND     ABO/RH(D) O POS     Antibody Screen NEG     Electronic Crossmatch YES     Unit Number [id number]     Blood Component Type RBC,ADSOL,LEUKO REDUCED,1ST CONT.     Unit Division 00     Status OF Unit ISSUED     ISSUE DATE TIME [id number]     PRODUCT CODE [id number]     BLOOD TYPE O POS     CODING STATUS 5100     BLOOD EXPIRATION DATE [id number]     Transfusion Status OK TO TRANSFUSE     Crossmatch Result COMPATIBLE,ELECTRONIC        Point of Care Testing  (Last 24 hours)  Glucose: (!) 101 (08/25/23 0525)    Radiology and other Diagnostics Review:    tele: sinus-sinus brady      Willo LELON Phenes, MD   Pager [phone]

## 2023-08-26 ENCOUNTER — Encounter: Admit: 2023-08-26 | Discharge: 2023-08-26 | Payer: MEDICARE

## 2023-08-26 LAB — PREPARE RBC - SUNQUEST
ANTIBODY SCREEN: NEGATIVE
BLOOD EXPIRATION DATE: 202
CODING STATUS: 510
ISSUE DATE TIME: 202
PRODUCT CODE: 0
UNIT DIVISION: 0
UNITS ORDERED: 1

## 2023-08-26 LAB — COMPREHENSIVE METABOLIC PANEL
~~LOC~~ BKR ALBUMIN: 2.7 g/dL — ABNORMAL LOW (ref 3.5–5.0)
~~LOC~~ BKR ALK PHOSPHATASE: 301 U/L — ABNORMAL HIGH (ref 25–110)
~~LOC~~ BKR ALT: 70 U/L — ABNORMAL HIGH (ref 7–56)
~~LOC~~ BKR ANION GAP: 12 g/dL — ABNORMAL LOW (ref 3–12)
~~LOC~~ BKR AST: 91 U/L — ABNORMAL HIGH (ref 7–40)
~~LOC~~ BKR BLD UREA NITROGEN: 49 mg/dL — ABNORMAL HIGH (ref 7–25)
~~LOC~~ BKR CALCIUM: 8.3 mg/dL — ABNORMAL LOW (ref 8.5–10.6)
~~LOC~~ BKR CHLORIDE: 102 mmol/L — ABNORMAL LOW (ref 98–110)
~~LOC~~ BKR CO2: 25 mmol/L — ABNORMAL LOW (ref 21–30)
~~LOC~~ BKR CREATININE: 3.6 mg/dL — ABNORMAL HIGH (ref 0.40–1.00)
~~LOC~~ BKR GLOMERULAR FILTRATION RATE (GFR): 13 mL/min — ABNORMAL LOW (ref >60–45.0)
~~LOC~~ BKR POTASSIUM: 3.9 mmol/L — ABNORMAL LOW (ref 3.5–5.1)
~~LOC~~ BKR TOTAL PROTEIN: 5.8 g/dL — ABNORMAL LOW (ref 6.0–8.0)

## 2023-08-26 LAB — MANUAL DIFF
~~LOC~~ BKR ADJUSTED WBC FROM MANUAL DIFF: 14 10*3/uL
~~LOC~~ BKR ADJUSTED WBC FROM MANUAL DIFF: 16 10*3/uL
~~LOC~~ BKR BASOPHILS - RELATIVE: 1 % (ref 0–2)
~~LOC~~ BKR EOSINOPHILS - RELATIVE: 3 % (ref 0–5)
~~LOC~~ BKR EOSINOPHILS - RELATIVE: 5 % (ref 0–5)
~~LOC~~ BKR LYMPHOCYTES - RELATIVE: 11 % — ABNORMAL LOW (ref 24–44)
~~LOC~~ BKR METAMYELOCYTES - RELATIVE: 5 %
~~LOC~~ BKR METAMYELOCYTES - RELATIVE: 5 %
~~LOC~~ BKR MONOCYTES - RELATIVE: 11 % (ref 4–12)
~~LOC~~ BKR MONOCYTES - RELATIVE: 8 % (ref 4–12)
~~LOC~~ BKR MYELOCYTES - RELATIVE: 6 %
~~LOC~~ BKR MYELOCYTES - RELATIVE: 7 %
~~LOC~~ BKR NEUT+BANDS - ABSOLUTE: 8.2 10*3/uL — ABNORMAL HIGH (ref 1.8–7.0)
~~LOC~~ BKR NEUT+BANDS - ABSOLUTE: 10 10*3/uL — ABNORMAL HIGH (ref 1.8–7.0)
~~LOC~~ BKR NEUTROPHILS - RELATIVE: 63 % (ref 41–77)
~~LOC~~ BKR NUCLEATED RBC MANUAL: 15 10*3/uL — ABNORMAL HIGH (ref 70–100)
~~LOC~~ BKR NUCLEATED RBC MANUAL: 12 10*3/uL

## 2023-08-26 LAB — URINALYSIS DIPSTICK
~~LOC~~ BKR NITRITE: NEGATIVE ms
~~LOC~~ BKR URINE ASCORBIC ACID, UA: NEGATIVE ms
~~LOC~~ BKR URINE BILE: POSITIVE /HPF — AB
~~LOC~~ BKR URINE BLOOD: NEGATIVE
~~LOC~~ BKR URINE KETONE: NEGATIVE /HPF — AB
~~LOC~~ BKR URINE PH: 6 /HPF — AB (ref 5.0–8.0)
~~LOC~~ BKR URINE SPEC GRAVITY: 1 /HPF — AB (ref 1.005–1.030)

## 2023-08-26 LAB — CBC AND DIFF
~~LOC~~ BKR ADJUSTED WBC SYNCED FROM MANUAL DIFF: 14 10*3/uL — ABNORMAL HIGH (ref 4.50–11.00)
~~LOC~~ BKR ADJUSTED WBC SYNCED FROM MANUAL DIFF: 16 10*3/uL — ABNORMAL HIGH (ref 4.50–11.00)
~~LOC~~ BKR HEMATOCRIT: 26 % — ABNORMAL LOW (ref 36.0–45.0)
~~LOC~~ BKR HEMOGLOBIN: 9.2 g/dL — ABNORMAL LOW (ref 12.0–15.0)
~~LOC~~ BKR MCV: 92 fL — ABNORMAL HIGH (ref 80.0–100.0)
~~LOC~~ BKR MCV: 92 fL (ref 80.0–100.0)
~~LOC~~ BKR MPV: 9.8 fL — ABNORMAL HIGH (ref 7.0–11.0)
~~LOC~~ BKR RBC COUNT: 2.5 10*6/uL — ABNORMAL LOW (ref 4.00–5.00)
~~LOC~~ BKR RBC COUNT: 2.8 10*6/uL — ABNORMAL LOW (ref 4.00–5.00)

## 2023-08-26 LAB — TYPE & CROSSMATCH
~~LOC~~ BKR ANTIBODY SCREEN: NEGATIVE
~~LOC~~ BKR UNITS ORDERED: 1

## 2023-08-26 LAB — CREATININE-URINE RANDOM: ~~LOC~~ BKR UR CREATININE, RAN: 73 mg/dL

## 2023-08-26 LAB — PROTIME INR (PT)
~~LOC~~ BKR INR: 1.1 g/dL — ABNORMAL LOW (ref 0.9–1.2)
~~LOC~~ BKR PROTIME: 12 s (ref 9.9–14.2)

## 2023-08-26 LAB — SODIUM-URINE RANDOM: ~~LOC~~ BKR UR SODIUM, RAN: 64 mmol/L

## 2023-08-26 MED ORDER — LACTATED RINGERS IV BOLUS
1000 mL | Freq: Once | INTRAVENOUS | 0 refills | Status: CP
Start: 2023-08-26 — End: ?
  Administered 2023-08-26: 16:00:00 1000 mL via INTRAVENOUS

## 2023-08-26 NOTE — Progress Notes
 Patient/family declined:  Bed/chair alarm, W/in arms reach during toileting/showering, and Ambulation.    Patient/family educated on importance of intervention to their safety/quality of care. Patient/family continues to decline care.    Reason Why Patient/Family declined:  Patient has family stay with her overnight & helped her.    Individualized safety and/or care plan implemented. If additional safety measures implemented, please list them.     Escalated to:  Unit coordinator/charge nurse.

## 2023-08-26 NOTE — Progress Notes
 General Progress Note    Name: Vicki Buchanan        MRN: [id number]          DOB: 02/23/1956            Age: 68 y.o.  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 She underwent ERCP 08/21/23 with viabil stent placement in the ampulla, complicated by post ERCP pancreatitis.  She has also had hypotension requiring significant IV fluids (she gets 1-2L MWF as an outpatient).      Mucinous adenocarcinoma of the appendix  hyperbilirubinemia  malignant biliary obstruction  ascending cholangitis   post ERCP pancreatitis   - admitted with rising bili, leukocytosis, anemia  - oncology consulted, appreciate assistance  - recently hospitalized at [facility] with pus noted to be coming out of her bile ducts during ERCP 08/06/23  - biliary consulted, s/p ERCP 08/21/23: Uncovered metal stent coming out of the ampulla with double-pigtail plastic stent going through the lumen of the metal stent.  Food and stent debris seen in stent lumen.  Occlusion cholangiogram performed that showed narrowing in the midportion of the stent consistent with tumor/tissue ingrowth.  Right hepatic duct mildly dilated without any obvious stricture.  Left intrahepatic ducts normal-appearing.  A 10 mm x 8 cm viabil stent was deployed within the previously placed uncovered biliary stent with good outflow of contrast.   - CT A/P 6/5:   Development of small left pleural effusion and minimal basilar bronchiolitis.  No small bowel obstruction, ascites, or free air.  Redemonstration of extensive partially calcified peritoneal metastatic disease.   New small subcutaneous nodule right lateral pelvis which is probably a new metastasis.   Right ischial pressure ulcer.   - abdominal pain, elevated lipase post ERCP: post-procedure pancreatitis  - continue aggressive IVF  - monitor LFTs, bilirubin--bili not significantly lower post ERCP, may need further drainage via IR  - continue analgesics  - ID consulted  - continue zosyn  (6/2--6  - blood cultures 6/2 with NGTD   > PTC ordered in IR to access smaller peripheral intrahepatic ducts and potentially help drain the left system.     Anemia  > 1 unit prbcs ordered on 6/7.     Hypotension--improved   - Repeated rapid responses 6/5-6/6 for hypotension, received 5L IVF  - Echo 08/22/23: The left ventricular size is normal. The left ventricular wall thickness is normal. Normal geometry.  The left ventricular systolic function is normal. The ejection fraction by Simpson's biplane method is 61%. There are no segmental wall motion abnormalities. Normal left ventricular diastolic function.  The right ventricular size is normal. The right ventricular systolic function is normal.  Nonspecific thickening of the mitral valve with mild to moderate regurgitation, no stenosis.  IVC was not visualized on the study.  No pericardial effusion.  - hold home hydralazine , carvedilol   - stop IVF  - patient gets 1-2L IV fluids MWF as an outpatient, will need to watch her ins/outs     CKD4  chronic mild L hydronephrosis   -  follows with Dr. Marlo as outpatient    GERD  - continue PPI  - add PRN GI cocktail     FEN:   Fluids: stop LR   Monitor and replace electrolytes as needed  Diet: cardiac     DVT PPX: heparin  SQ     CODE STATUS: FULL CODE   NOK: husband, updated at bedside today     Disposition:  continue admission     Today, 08/26/2023, I personally reviewed vitals, labs, imaging, telemetry, nursing notes, and consultant notes.  I also discussed this patient's care with the patient, patient's family, the Child psychotherapist, the case manager, the pharmacist, and the consultant from nephrology.  Furthermore, I ordered labs as part of continued management of Jari Carollo.      _______________________________________________________________________    Subjective   Vicki Buchanan is a 68 y.o. female.  Patient hypotension improved.Bili is elevated at 16. Reached out to GI to discuss PTC, but still awaiting response. No new symptoms. Per patient. Creatinine elevated, requesting fluids.        Medications  Scheduled Meds:cyanocobalamin  (vitamin B-12) (RUBRAMIN  PC) injection 1,000 mcg, 1,000 mcg, Intramuscular, QDAY  ERGOcalciferoL  (vitamin D2) (DRISDOL ) capsule 50,000 Units, 50,000 Units, Oral, Q7 Days  heparin  (porcine) PF syringe 5,000 Units, 5,000 Units, Subcutaneous, Q8H  lactated ringers  IV bolus 1,000 mL, 1,000 mL, Intravenous, ONCE  pantoprazole  (PROTONIX ) injection 40 mg, 40 mg, Intravenous, QDAY    Continuous Infusions:      PRN and Respiratory Meds:acetaminophen  Q6H PRN, alteplase  BID PRN, alum-mag hydroxide-simeth Q4H PRN **AND** [EXPIRED] lidocaine  hcl viscous ONCE, calcium  carbonate Q6H PRN, cetirizine  QDAY PRN, diphenoxylate -atropine  QID PRN, fentaNYL  citrate PF Q3H while awake PRN, hydrOXYzine  HCL Q6H PRN, loperamide  QID PRN, melatonin QHS PRN, ondansetron  Q6H PRN **OR** ondansetron  (ZOFRAN ) IV Q6H PRN, oxyCODONE  Q4H PRN, polyethylene glycol 3350  QDAY PRN, sennosides-docusate sodium  QDAY PRN, trimethobenzamide  Q6H PRN      Objective                        Vital Signs: Last Filed                 Vital Signs: 24 Hour Range   BP: 150/69 (06/08 0847)  Temp: 36.4 ?C (97.6 ?F) (06/08 9152)  Pulse: 57 (06/08 0847)  Respirations: 18 PER MINUTE (06/08 0847)  SpO2: 96 % (06/08 0847)  O2 Device: None (Room air) (06/08 0901) BP: (112-150)/(59-69)   Temp:  [36.4 ?C (97.6 ?F)-36.8 ?C (98.2 ?F)]   Pulse:  [57-63]   Respirations:  [16 PER MINUTE-18 PER MINUTE]   SpO2:  [96 %-98 %]   O2 Device: None (Room air)     Vitals:    08/22/23 1143 08/22/23 1503 08/24/23 0557   Weight: 51.3 kg (113 lb) 51.3 kg (113 lb) 58.7 kg (129 lb 6.4 oz)         Intake/Output Summary:  (Last 24 hours)    Intake/Output Summary (Last 24 hours) at 08/26/2023 1221  Last data filed at 08/26/2023 0700  Gross per 24 hour   Intake 904.32 ml   Output 1150 ml   Net -245.68 ml     Stool Occurrence: 0        Physical Exam  General: alert, oriented, no acute distress, appears stated age  Head: normocephalic, atraumatic, no obvious abnormality  Eyes: conjunctivae icteric, PERRL  Nose: nares normal  Throat: normal oropharynx.  Lips and tongue normal.   Lungs: clear to auscultation  bilaterally  Heart: normal rate, regular rhythm.  S1 and S2 normal.  No murmur.   Abdomen: soft, non tender, non distended, normoactive bowel sounds.    Extremities: 1+ lower extremity edema  Peripheral pulses: 2+ radial and dorsalis pedis bilaterally   Skin: dry, jaundiced  Psych: mood good       Lab Review  24-hour labs:    Results for orders placed or performed during the hospital encounter of 08/20/23 (from the past 24 hours)   PROTIME INR (PT)    Collection Time: 08/26/23  6:40 AM   Result Value Ref Range    Protime 12.6 9.9 - 14.2 Seconds    INR 1.1 0.9 - 1.2   CBC AND DIFF    Collection Time: 08/26/23  6:40 AM   Result Value Ref Range    Adjusted WBC 14.35 (H) 4.50 - 11.00 10*3/uL    Red Blood Cells 2.58 (L) 4.00 - 5.00 10*6/uL    Hemoglobin 8.3 (L) 12.0 - 15.0 g/dL    Hematocrit 76.0 (L) 36.0 - 45.0 %    MCV 92.5 80.0 - 100.0 fL    MCH 32.2 26.0 - 34.0 pg    MCHC 34.8 32.0 - 36.0 g/dL    RDW 79.3 (H) 88.9 - 15.0 %    Platelet Count 186 150 - 400 10*3/uL    MPV 9.8 7.0 - 11.0 fL   COMPREHENSIVE METABOLIC PANEL    Collection Time: 08/26/23  6:40 AM   Result Value Ref Range    Sodium 139 137 - 147 mmol/L    Potassium 3.9 3.5 - 5.1 mmol/L    Chloride 102 98 - 110 mmol/L    Glucose 129 (H) 70 - 100 mg/dL    Blood Urea Nitrogen 49 (H) 7 - 25 mg/dL Creatinine 6.36 (H) 9.59 - 1.00 mg/dL    Calcium  8.3 (L) 8.5 - 10.6 mg/dL    Total Protein 5.8 (L) 6.0 - 8.0 g/dL    Total Bilirubin 83.4 (H) 0.2 - 1.3 mg/dL    Albumin  2.7 (L) 3.5 - 5.0 g/dL    Alk Phosphatase 698 (H) 25 - 110 U/L    AST 91 (H) 7 - 40 U/L    ALT 70 (H) 7 - 56 U/L    CO2 25 21 - 30 mmol/L    Anion Gap 12 3 - 12    Glomerular Filtration Rate (GFR) 13 (L) >60 mL/min   MAGNESIUM     Collection Time: 08/26/23  6:40 AM   Result Value Ref Range    Magnesium  1.8 1.6 - 2.6 mg/dL   PHOSPHORUS    Collection Time: 08/26/23  6:40 AM   Result Value Ref Range    Phosphorus 2.9 2.0 - 4.5 mg/dL   MANUAL DIFF    Collection Time: 08/26/23  6:40 AM   Result Value Ref Range    ANISO Present     Target Present     Platelet Estimate Normal     Segmented Neutrophils 57 41 - 77 %    Lymphocytes 18 (L) 24 - 44 %    Monocytes 11 4 - 12 %    Eosinophil 3 0 - 5 %    Metamyelocyte 5 %    Myelocyte 6 %    Adjusted WBC 14.35 10*3/uL    Absolute Neutrophil Count Manual 8.2 (H) 1.8 - 7.0 10*3/uL    nRBCs (Manual Diff) 15 10*3/uL       Point of Care Testing  (Last 24  hours)  Glucose: (!) 129 (08/26/23 0640)    Radiology and other Diagnostics Review:    tele: sinus-sinus brady      Willo LELON Phenes, MD   Pager [phone]

## 2023-08-26 NOTE — Unmapped
 Patient/family declined:  Bed/chair alarm, W/in arms reach during toileting/showering, and Ambulation.    Patient/family educated on importance of intervention to their safety/quality of care. Patient/family continues to decline care.    Reason Why Patient/Family declined:  Family friend who is retired Engineer, civil (consulting) staying with her here from [place]  in room.    Individualized safety and/or care plan implemented. If additional safety measures implemented, please list them. Help keeping target items in reach and hourly rounding by staff.    Escalated to:  Unit coordinator/charge nurse.

## 2023-08-27 ENCOUNTER — Encounter: Admit: 2023-08-27 | Discharge: 2023-08-27 | Payer: MEDICARE

## 2023-08-27 LAB — MANUAL DIFF
~~LOC~~ BKR BANDS - RELATIVE: 4 % (ref 0–10)
~~LOC~~ BKR EOSINOPHILS - RELATIVE: 4 % (ref 0–5)
~~LOC~~ BKR LYMPHOCYTES - RELATIVE: 5 % — ABNORMAL LOW (ref 24–44)
~~LOC~~ BKR METAMYELOCYTES - RELATIVE: 4 %
~~LOC~~ BKR MONOCYTES - RELATIVE: 11 % (ref 4–12)
~~LOC~~ BKR MYELOCYTES - RELATIVE: 3 %
~~LOC~~ BKR NEUT+BANDS - ABSOLUTE: 15 10*3/uL — ABNORMAL HIGH (ref 1.8–7.0)
~~LOC~~ BKR NEUTROPHILS - RELATIVE: 69 % (ref 41–77)
~~LOC~~ BKR NUCLEATED RBC MANUAL: 8 10*3/uL — ABNORMAL LOW (ref 41–77)

## 2023-08-27 LAB — MAGNESIUM: ~~LOC~~ BKR MAGNESIUM: 1.7 mg/dL — ABNORMAL LOW (ref 1.6–2.6)

## 2023-08-27 LAB — PHOSPHORUS: ~~LOC~~ BKR PHOSPHORUS: 1.8 mg/dL — ABNORMAL LOW (ref 2.0–4.5)

## 2023-08-27 LAB — CBC AND DIFF
~~LOC~~ BKR HEMOGLOBIN: 8.7 g/dL — ABNORMAL LOW (ref 12.0–15.0)
~~LOC~~ BKR MCH: 32 pg — ABNORMAL HIGH (ref 26.0–34.0)

## 2023-08-27 MED ORDER — LACTATED RINGERS IV BOLUS
1000 mL | Freq: Once | INTRAVENOUS | 0 refills | Status: CP
Start: 2023-08-27 — End: ?
  Administered 2023-08-27: 16:00:00 1000 mL via INTRAVENOUS

## 2023-08-27 NOTE — Progress Notes
 General Progress Note    Name:  Vicki Buchanan   Today's Date:  08/27/2023  Admission Date: 08/20/2023  LOS: 7 days                     Assessment/Plan:    Principal Problem:    Cholangitis (CMS-HCC)  Active Problems:    Peritoneal carcinomatosis (CMS-HCC)    Cancer of appendix (CMS-HCC)    Hyperbilirubinemia    Mucinous adenocarcinoma of appendix   - Oncologist: Dr. Jameson  - Current Treatment: 5FU + Irinotecan + Avastin   - 4/9: completed C39 Avastin , C41 5Fu  + Irinotecan   - 6/16: C42 planned  - 6/2: CEA 2565.5, up from 1341.3 on 4/9  - 6/5: CT a/p WO- Development of small left pleural effusion and minimal basilar   bronchiolitis. No small bowel obstruction, ascites, or free air. Redemonstration of extensive partially calcified peritoneal metastatic disease. New small subcutaneous nodule right lateral pelvis which is probably a new metastasis.      Hyperbilirubinemia, elevated LFTs  in setting of malignant biliary obstruction   Suspected ascending cholangitis  - Total bilirubin 15.8 on 5/27 25-17.2 on 08/20/2023  - 5/29: stent exchange  - 08/21/23: ERCP: A 10 mm x 8 cm viabil stent was deployed within the previously placed uncovered biliary stent with good outflow of contrast.   - 6/2: blood cx NTD  - 6/2: urine cx NTD  - 6/5: TB 16.9  - 6/9: TB 16.9  - ABX: Zosyn      Post ERCP pancreatitis     CKD stage 4  - 6/5: creatinine 3.17  - 6/6: creatinine 3.02  - 6/9: creatinine 3.28    Hypotension  - 6/6: RR for lethargy, hypotension  - s/p albumin , IVF     Recommendations - Plan:  - No plan for inpatient systemic cancer treatment  - infection work up ongoing, trending bilirubin levels  - IR consult for PTC placement for management of ongoing hyperbilirubinemia- scheduled for tomorrow am  - continue to maximize supportive care measures  - Follow up with Dr. Jameson- appt requested  - Please see Dr. Marcy addendum to this note for further recommendations  ________________________________________________________________________    Subjective  [name] is a 68 y.o. female.      Medications  Scheduled Meds:cyanocobalamin  (vitamin B-12) (RUBRAMIN  PC) injection 1,000 mcg, 1,000 mcg, Intramuscular, QDAY  ERGOcalciferoL  (vitamin D2) (DRISDOL ) capsule 50,000 Units, 50,000 Units, Oral, Q7 Days  heparin  (porcine) PF syringe 5,000 Units, 5,000 Units, Subcutaneous, Q8H  pantoprazole  (PROTONIX ) injection 40 mg, 40 mg, Intravenous, QDAY    Continuous Infusions:      PRN and Respiratory Meds:acetaminophen  Q6H PRN, alteplase  BID PRN, alum-mag hydroxide-simeth Q4H PRN **AND** [EXPIRED] lidocaine  hcl viscous ONCE, calcium  carbonate Q6H PRN, cetirizine  QDAY PRN, diphenoxylate -atropine  QID PRN, fentaNYL  citrate PF Q3H while awake PRN, hydrOXYzine  HCL Q6H PRN, loperamide  QID PRN, melatonin QHS PRN, ondansetron  Q6H PRN **OR** ondansetron  (ZOFRAN ) IV Q6H PRN, oxyCODONE  Q4H PRN, polyethylene glycol 3350  QDAY PRN, sennosides-docusate sodium  QDAY PRN, trimethobenzamide  Q6H PRN      Review of Systems:  A 14 point review of systems was negative except for: Constitutional: positive for fatigue and malaise    Objective:                          Vital Signs: Last Filed                 Vital Signs: 24  Hour Range   BP: 150/72 (06/09 0738)  Temp: 36.9 ?C (98.4 ?F) (06/09 9261)  Pulse: 66 (06/09 0738)  Respirations: 16 PER MINUTE (06/09 0738)  SpO2: 96 % (06/09 0738)  O2 Device: None (Room air) (06/09 0738) BP: (146-161)/(69-72)   Temp:  [36.4 ?C (97.6 ?F)-36.9 ?C (98.4 ?F)]   Pulse:  [57-68]   Respirations:  [16 PER MINUTE-18 PER MINUTE]   SpO2:  [95 %-97 %]   O2 Device: None (Room air)     Vitals:    08/22/23 1143 08/22/23 1503 08/24/23 0557   Weight: 51.3 kg (113 lb) 51.3 kg (113 lb) 58.7 kg (129 lb 6.4 oz)         Intake/Output Summary:  (Last 24 hours)    Intake/Output Summary (Last 24 hours) at 08/27/2023 9157  Last data filed at 08/26/2023 2100  Gross per 24 hour Intake --   Output 400 ml   Net -400 ml           Physical Exam  General:  Alert, cooperative, no distress, appears stated age  Head:  Normocephalic, without obvious abnormality, atraumatic  Lungs:  non-labored  Skin: + jaundice  Neurologic:   no focal deficits    Lab Review  24-hour labs:    Results for orders placed or performed during the hospital encounter of 08/20/23 (from the past 24 hours)   CBC AND DIFF    Collection Time: 08/26/23 11:10 AM   Result Value Ref Range    Adjusted WBC 16.88 (H) 4.50 - 11.00 10*3/uL    Red Blood Cells 2.84 (L) 4.00 - 5.00 10*6/uL    Hemoglobin 9.2 (L) 12.0 - 15.0 g/dL    Hematocrit 73.5 (L) 36.0 - 45.0 %    MCV 92.9 80.0 - 100.0 fL    MCH 32.2 26.0 - 34.0 pg    MCHC 34.7 32.0 - 36.0 g/dL    RDW 79.3 (H) 88.9 - 15.0 %    Platelet Count 149 (L) 150 - 400 10*3/uL    MPV 9.8 7.0 - 11.0 fL   MANUAL DIFF    Collection Time: 08/26/23 11:10 AM   Result Value Ref Range    ANISO Present     Target Present     Platelet Estimate Normal     Segmented Neutrophils 63 41 - 77 %    Lymphocytes 11 (L) 24 - 44 %    Monocytes 8 4 - 12 %    Eosinophil 5 0 - 5 %    Metamyelocyte 5 %    Myelocyte 7 %    Basophil 1 0 - 2 %    Adjusted WBC 16.88 10*3/uL    Absolute Neutrophil Count Manual 10.6 (H) 1.8 - 7.0 10*3/uL    nRBCs (Manual Diff) 12 10*3/uL   SODIUM-URINE RANDOM    Collection Time: 08/26/23  3:09 PM   Result Value Ref Range    Urine Sodium 64 mmol/L   CREATININE-URINE RANDOM    Collection Time: 08/26/23  3:09 PM   Result Value Ref Range    Urine Creatinine 73 mg/dL   URINALYSIS DIPSTICK    Collection Time: 08/26/23  3:09 PM   Result Value Ref Range    Color,UA Amber     Turbidity,UA Clear Clear    Specific Gravity-Urine 1.017 1.005 - 1.030    pH,UA 6.0 5.0 - 8.0    Protein,UA 1+ (A) Negative    Glucose,UA 1+ (A) Negative    Ketones,UA Negative Negative  Bilirubin,UA Positive (A) Negative    Blood,UA Negative Negative    Urobilinogen,UA Normal Normal    Nitrite,UA Negative Negative Leukocytes,UA Trace (A) Negative    Urine Ascorbic Acid, UA Negative Negative   URINALYSIS, MICROSCOPIC    Collection Time: 08/26/23  3:09 PM   Result Value Ref Range    WBCs,UA 11 - 20 (A) None, 0 - 2  /HPF    RBCs,UA 2 - 10 (A) None, 0 - 2  /HPF    Mucous,UA Trace None, Trace /LPF    Bacteria,UA Few (A) None /HPF    Yeast,UA Packed (A) None /HPF    Squamous Epithelial Cells 0 - 2 None, 0 - 2 , 2 - 5 /HPF   PROTIME INR (PT)    Collection Time: 08/27/23  4:33 AM   Result Value Ref Range    Protime 12.5 9.9 - 14.2 Seconds    INR 1.1 0.9 - 1.2   CBC AND DIFF    Collection Time: 08/27/23  4:33 AM   Result Value Ref Range    White Blood Cells 19.90 (H) 4.50 - 11.00 10*3/uL    Red Blood Cells 2.70 (L) 4.00 - 5.00 10*6/uL    Hemoglobin 8.7 (L) 12.0 - 15.0 g/dL    Hematocrit 74.8 (L) 36.0 - 45.0 %    MCV 93.1 80.0 - 100.0 fL    MCH 32.1 26.0 - 34.0 pg    MCHC 34.5 32.0 - 36.0 g/dL    RDW 78.9 (H) 88.9 - 15.0 %    Platelet Count 149 (L) 150 - 400 10*3/uL    MPV 9.9 7.0 - 11.0 fL   COMPREHENSIVE METABOLIC PANEL    Collection Time: 08/27/23  4:33 AM   Result Value Ref Range    Sodium 138 137 - 147 mmol/L    Potassium 3.7 3.5 - 5.1 mmol/L    Chloride 103 98 - 110 mmol/L    Glucose 183 (H) 70 - 100 mg/dL    Blood Urea Nitrogen 41 (H) 7 - 25 mg/dL    Creatinine 6.71 (H) 0.40 - 1.00 mg/dL    Calcium  8.2 (L) 8.5 - 10.6 mg/dL    Total Protein 5.9 (L) 6.0 - 8.0 g/dL    Total Bilirubin 83.0 (H) 0.2 - 1.3 mg/dL    Albumin  2.6 (L) 3.5 - 5.0 g/dL    Alk Phosphatase 668 (H) 25 - 110 U/L    AST 79 (H) 7 - 40 U/L    ALT 62 (H) 7 - 56 U/L    CO2 24 21 - 30 mmol/L    Anion Gap 11 3 - 12    Glomerular Filtration Rate (GFR) 15 (L) >60 mL/min   MAGNESIUM     Collection Time: 08/27/23  4:33 AM   Result Value Ref Range    Magnesium  1.7 1.6 - 2.6 mg/dL   PHOSPHORUS    Collection Time: 08/27/23  4:33 AM   Result Value Ref Range    Phosphorus 1.8 (L) 2.0 - 4.5 mg/dL   MANUAL DIFF    Collection Time: 08/27/23  4:33 AM   Result Value Ref Range    ANISO Present     HYPO Present     POLY Present     Target Present     Platelet Estimate Slight Decrease     Segmented Neutrophils 69 41 - 77 %    Lymphocytes 5 (L) 24 - 44 %    Monocytes 11 4 - 12 %  Eosinophil 4 0 - 5 %    Metamyelocyte 4 %    Myelocyte 3 %    Bands 4 0 - 10 %    Absolute Neutrophil Count Manual 15.3 (H) 1.8 - 7.0 10*3/uL    nRBCs (Manual Diff) 8 10*3/uL       Point of Care Testing  (Last 24 hours)  Glucose: (!) 183 (08/27/23 0433)    Radiology and other Diagnostics Review:    Pertinent radiology reviewed.    Almarie FORBES Li, APRN-NP   [facility]  Pager # [phone]

## 2023-08-27 NOTE — Consults
 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 She underwent ERCP 08/21/23 with viabil stent placement in the ampulla, complicated by post ERCP pancreatitis.   - Review of imaging significant for   CT AB/PELVIS  metastatic mucinous adenocarcinoma of the appendix (peritoneal mets, TAH/BSO, diverting loop ileostomy, malignant biliary obstruction) who presented 08/20/23 with anemia, hyperbilirubinemia, and leukocytosis and was admitted with cholangitis. She underwent ERCP 08/21/23 with viabil stent placement in the ampulla, complicated by post ERCP pancreatitis.   - Jaundiced on exam  - Tbilli 16.9  - LFTS elevated  - WBC 19.9  -Afebrile  - Labs, medications, and allergies meet procedural protocol.   - Pt is not on a therapeutic blood thinner  -   Platelet Count   Date Value Ref Range Status   08/27/2023 149 (L) 150 - 400 10*3/uL Final   ;   INR   Date Value Ref Range Status   08/27/2023 1.1 0.9 - 1.2 Final       Plan:  - IR PTC placement, which has been reviewed and added onto the [facility] schedule for 08/28/2023.   - Procedural risks include: Possible damage to structures (skin, blood vessels, organs, and nerves) through which or near where needles, guidewires, and drains may travel.  Pain. Organ specific risks/side effects may include: bile leakage, urine leakage, profound infection (sepsis, peritonitis), excessive bleeding or introduction of air into body cavities.   - For sedation purposes, please keep NPO prior to procedure. May take PO medications with sips of water .    We appreciate being able to participate in this patient's care. Please page with any questions or concerns.    Lacinda DELENA Char, APRN-NP   Pgr [phone]  [place]    IR Team Pager [phone] (After-hours and Weekends)  __________________________________________________________________      Procedure: IR PTC placement    IR Notes:  Needs MD to see  __________________________________________________________________    Chief Complaint:  Cholangitis, jaundice    Previous Anesthetic/Sedation History:  Reviewed.    Code Status: Full Code    History of present illness:  Vicki Buchanan is a 68 y.o. female patient with hx listed above. IR consulted for PTC placement. See ROS below for current symptoms    Review of Systems  Gastrointestinal: positive for jaundice    Medications  Scheduled Meds:ERGOcalciferoL  (vitamin D2) (DRISDOL ) capsule 50,000 Units, 50,000 Units, Oral, Q7 Days  heparin  (porcine) PF syringe 5,000 Units, 5,000 Units, Subcutaneous, Q8H  lactated ringers  IV bolus 1,000 mL, 1,000 mL, Intravenous, ONCE  pantoprazole  (PROTONIX ) injection 40 mg, 40 mg, Intravenous, QDAY    Continuous Infusions:  PRN and Respiratory Meds:acetaminophen  Q6H PRN, alteplase  BID PRN, alum-mag hydroxide-simeth Q4H PRN **AND** [EXPIRED] lidocaine  hcl viscous ONCE, calcium  carbonate Q6H PRN, cetirizine  QDAY PRN, diphenoxylate -atropine  QID PRN, fentaNYL  citrate PF Q3H while awake PRN, hydrOXYzine  HCL Q6H PRN, loperamide  QID PRN, melatonin  QHS PRN, ondansetron  Q6H PRN **OR** ondansetron  (ZOFRAN ) IV Q6H PRN, oxyCODONE  Q4H PRN, polyethylene glycol 3350  QDAY PRN, sennosides-docusate sodium  QDAY PRN, trimethobenzamide  Q6H PRN      Objective                       Vital Signs: Last Filed                 Vital Signs: 24 Hour Range   BP: 158/69 (06/09 1540)  Temp: 36.7 ?C (98.1 ?F) (06/09 1540)  Pulse: 66 (06/09 1540)  Respirations: 16 PER MINUTE (06/09 1540)  SpO2: 95 % (06/09 1540)  O2 Device: None (Room air) (06/09 1540) BP: (146-161)/(69-72)   Temp:  [36.7 ?C (98 ?F)-36.9 ?C (98.4 ?F)]   Pulse:  [66-68]   Respirations:  [16 PER MINUTE-18 PER MINUTE]   SpO2:  [95 %-97 %]   O2 Device: None (Room air)   Intensity Pain Scale (Self Report): 3 (08/27/23 0855) Vitals:    08/22/23 1143 08/22/23 1503 08/24/23 0557   Weight: 51.3 kg (113 lb) 51.3 kg (113 lb) 58.7 kg (129 lb 6.4 oz)         Intake/Output Summary:  (Last 24 hours)    Intake/Output Summary (Last 24 hours) at 08/27/2023 1600  Last data filed at 08/26/2023 2100  Gross per 24 hour   Intake --   Output 400 ml   Net -400 ml           Physical Exam  General appearance: alert and no distress  Neurologic: Grossly normal, at baseline  Lungs: Nonlabored with normal effort  Abdomen: soft, non-tender.   Extremities: extremities normal, atraumatic, no cyanosis or edema        Airway:  Per anesthesia   Anesthesia Classification:  Per anesthesia  Pre procedure anxiolysis plan: Per anesthesia  Sedation/Medication Plan: Per anesthesia  Personal history of sedation complications: Denies adverse event.   Family history of sedation complications: Denies adverse event.   Medications for Reversal: Per anesthesia  Discussion/Reviews:  Physician has discussed risks and alternatives of this type of sedation and above planned procedures with patient  NPO Status: Acceptable  Pregnancy Status: Per anesthesia      Lab/Radiology/Other Diagnostic Tests:  Labs:  24-hour labs:    Results for orders placed or performed during the hospital encounter of 08/20/23 (from the past 24 hours)   PROTIME INR (PT)    Collection Time: 08/27/23  4:33 AM   Result Value Ref Range    Protime 12.5 9.9 - 14.2 Seconds    INR 1.1 0.9 - 1.2   CBC AND DIFF    Collection Time: 08/27/23  4:33 AM   Result Value Ref Range    White Blood Cells 19.90 (H) 4.50 - 11.00 10*3/uL    Red Blood Cells 2.70 (L) 4.00 - 5.00 10*6/uL    Hemoglobin 8.7 (L) 12.0 - 15.0 g/dL    Hematocrit 74.8 (L) 36.0 - 45.0 % MCV 93.1 80.0 - 100.0 fL    MCH 32.1 26.0 - 34.0 pg    MCHC 34.5 32.0 - 36.0 g/dL    RDW 78.9 (H) 88.9 - 15.0 %    Platelet Count 149 (L) 150 - 400 10*3/uL    MPV 9.9 7.0 - 11.0 fL   COMPREHENSIVE METABOLIC PANEL    Collection Time: 08/27/23  4:33 AM   Result Value Ref Range    Sodium 138 137 - 147 mmol/L    Potassium 3.7  3.5 - 5.1 mmol/L    Chloride 103 98 - 110 mmol/L    Glucose 183 (H) 70 - 100 mg/dL    Blood Urea Nitrogen 41 (H) 7 - 25 mg/dL    Creatinine 6.71 (H) 0.40 - 1.00 mg/dL    Calcium  8.2 (L) 8.5 - 10.6 mg/dL    Total Protein 5.9 (L) 6.0 - 8.0 g/dL    Total Bilirubin 83.0 (H) 0.2 - 1.3 mg/dL    Albumin  2.6 (L) 3.5 - 5.0 g/dL    Alk Phosphatase 668 (H) 25 - 110 U/L    AST 79 (H) 7 - 40 U/L    ALT 62 (H) 7 - 56 U/L    CO2 24 21 - 30 mmol/L    Anion Gap 11 3 - 12    Glomerular Filtration Rate (GFR) 15 (L) >60 mL/min   MAGNESIUM     Collection Time: 08/27/23  4:33 AM   Result Value Ref Range    Magnesium  1.7 1.6 - 2.6 mg/dL   PHOSPHORUS    Collection Time: 08/27/23  4:33 AM   Result Value Ref Range    Phosphorus 1.8 (L) 2.0 - 4.5 mg/dL   MANUAL DIFF    Collection Time: 08/27/23  4:33 AM   Result Value Ref Range    ANISO Present     HYPO Present     POLY Present     Target Present     Platelet Estimate Slight Decrease     Segmented Neutrophils 69 41 - 77 %    Lymphocytes 5 (L) 24 - 44 %    Monocytes 11 4 - 12 %    Eosinophil 4 0 - 5 %    Metamyelocyte 4 %    Myelocyte 3 %    Bands 4 0 - 10 %    Absolute Neutrophil Count Manual 15.3 (H) 1.8 - 7.0 10*3/uL    nRBCs (Manual Diff) 8 10*3/uL     Radiology: Reviewed.

## 2023-08-27 NOTE — Case Management (ED)
 Case Management Progress Note    NAME:Vicki Buchanan                          MRN: [id number]              DOB:Nov 23, 1955          AGE: 68 y.o.  ADMISSION DATE: 08/20/2023             DAYS ADMITTED: LOS: 7 days      Today's Date: 08/27/2023    PLAN: anticipate drain placement on tomorrow, 6/11.  Team will monitor bili trend and DC when level is stable.     Expected Discharge Date: 08/28/2023   Is Patient Medically Stable: No, Please explain: drain placement  Are there Barriers to Discharge? no    INTERVENTION/DISPOSITION:  Discharge Planning              Discharge Planning: Outpatient Rehabilitation  NCM reviewed EMR and participated in MPP huddle  Pt will need drain supplies upon DC if PTC is placed  NCM will con to follow  Transportation              Does the Patient Need Case Management to Arrange Discharge Transport? (ex: facility, ambulance, wheelchair/stretcher, Medicaid, cab, other): No  Will the Patient Use Family Transport?: Yes  Transportation Name, Phone and Availability #1: Husband [name] [phone]  Support              Support: Huddle/team update  Info or Referral                 Positive SDOH Domains and Potential Barriers                   Medication Needs              Medication Needs: No Needs Identified                                                                                                                                          Financial              Financial: No Needs Identified  Legal              Legal: No Needs Identified  Other                 Discharge Disposition  Wilburt Dawn, RN    Wilburt Dawn, RN BSN  Integrated Nurse Case Production designer, theatre/television/film  Available on [employer]  [phone]

## 2023-08-27 NOTE — Progress Notes
 General Progress Note    Name: Vicki Buchanan        MRN: [id number]          DOB: [birth year]            Age: 68 y.o.  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 She underwent ERCP 08/21/23 with viabil stent placement in the ampulla, complicated by post ERCP pancreatitis.  She has also had hypotension requiring significant IV fluids (she gets 1-2L MWF as an outpatient).      Mucinous adenocarcinoma of the appendix  hyperbilirubinemia  malignant biliary obstruction  ascending cholangitis   post ERCP pancreatitis   - admitted with rising bili, leukocytosis, anemia  - oncology consulted, appreciate assistance  - recently hospitalized at [facility] with pus noted to be coming out of her bile ducts during ERCP 08/06/23  - biliary consulted, s/p ERCP 08/21/23: Uncovered metal stent coming out of the ampulla with double-pigtail plastic stent going through the lumen of the metal stent.  Food and stent debris seen in stent lumen.  Occlusion cholangiogram performed that showed narrowing in the midportion of the stent consistent with tumor/tissue ingrowth.  Right hepatic duct mildly dilated without any obvious stricture.  Left intrahepatic ducts normal-appearing.  A 10 mm x 8 cm viabil stent was deployed within the previously placed uncovered biliary stent with good outflow of contrast.   - CT A/P 6/5:   Development of small left pleural effusion and minimal basilar bronchiolitis.  No small bowel obstruction, ascites, or free air.  Redemonstration of extensive partially calcified peritoneal metastatic disease.   New small subcutaneous nodule right lateral pelvis which is probably a new metastasis.   Right ischial pressure ulcer.   - abdominal pain, elevated lipase post ERCP: post-procedure pancreatitis  - continue aggressive IVF  - monitor LFTs, bilirubin--bili not significantly lower post ERCP, may need further drainage via IR  - continue analgesics  - ID consulted  - continue zosyn  (6/2--6  - blood cultures 6/2 with NGTD   > PTC ordered in IR to access smaller peripheral intrahepatic ducts and potentially help drain the left system. IR to discuss with GI.    Anemia  > 1 unit prbcs ordered on 6/7.     Hypotension--improved   - Repeated rapid responses 6/5-6/6 for hypotension, received 5L IVF  - Echo 08/22/23: The left ventricular size is normal. The left ventricular wall thickness is normal. Normal geometry.  The left ventricular systolic function is normal. The ejection fraction by Simpson's biplane method is 61%. There are no segmental wall motion abnormalities. Normal left ventricular diastolic function.  The right ventricular size is normal. The right ventricular systolic function is normal.  Nonspecific thickening of the mitral valve with mild to moderate regurgitation, no stenosis.  IVC was not visualized on the study.  No pericardial effusion.  - hold home hydralazine , carvedilol   - stop IVF  - patient gets 1-2L IV fluids MWF as an outpatient, will need to watch her ins/outs     CKD4  chronic mild  L hydronephrosis   - follows with Dr. Marlo as outpatient    GERD  - continue PPI  - add PRN GI cocktail     FEN:   Fluids: stop LR   Monitor and replace electrolytes as needed  Diet: cardiac     DVT PPX: heparin  SQ     CODE STATUS: FULL CODE   NOK: husband, updated at bedside today     Disposition:  continue admission     Today, 08/27/2023, I personally reviewed vitals, labs, imaging, telemetry, nursing notes, and consultant notes.  I also discussed this patient's care with the patient, patient's family, the Child psychotherapist, the case manager, the pharmacist, and the consultant from nephrology.  Furthermore, I ordered labs as part of continued management of Vicki Buchanan.      _______________________________________________________________________    Subjective   Vicki Buchanan is a 68 y.o. female.  Patient's labs discussed. She has a slight headache today. No f/c/n/v     Medications  Scheduled Meds:ERGOcalciferoL  (vitamin D2) (DRISDOL ) capsule 50,000 Units, 50,000 Units, Oral, Q7 Days  heparin  (porcine) PF syringe 5,000 Units, 5,000 Units, Subcutaneous, Q8H  lactated ringers  IV bolus 1,000 mL, 1,000 mL, Intravenous, ONCE  pantoprazole  (PROTONIX ) injection 40 mg, 40 mg, Intravenous, QDAY    Continuous Infusions:      PRN and Respiratory Meds:acetaminophen  Q6H PRN, alteplase  BID PRN, alum-mag hydroxide-simeth Q4H PRN **AND** [EXPIRED] lidocaine  hcl viscous ONCE, calcium  carbonate Q6H PRN, cetirizine  QDAY PRN, diphenoxylate -atropine  QID PRN, fentaNYL  citrate PF Q3H while awake PRN, hydrOXYzine  HCL Q6H PRN, loperamide  QID PRN, melatonin QHS PRN, ondansetron  Q6H PRN **OR** ondansetron  (ZOFRAN ) IV Q6H PRN, oxyCODONE  Q4H PRN, polyethylene glycol 3350  QDAY PRN, sennosides-docusate sodium  QDAY PRN, trimethobenzamide  Q6H PRN      Objective                        Vital Signs: Last Filed                 Vital Signs: 24 Hour Range   BP: 150/72 (06/09 0738)  Temp: 36.9 ?C (98.4 ?F) (06/09 9261)  Pulse: 66 (06/09 0738)  Respirations: 16 PER MINUTE (06/09 0738)  SpO2: 96 % (06/09 0738)  O2 Device: None (Room air) (06/09 0738) BP: (146-161)/(69-72)   Temp:  [36.7 ?C (98 ?F)-36.9 ?C (98.4 ?F)]   Pulse:  [65-68]   Respirations:  [16 PER MINUTE-18 PER MINUTE]   SpO2:  [95 %-97 %]   O2 Device: None (Room air)   Intensity Pain Scale (Self Report): 3 (08/27/23 0855) Vitals:    08/22/23 1143 08/22/23 1503 08/24/23 0557   Weight: 51.3 kg (113 lb) 51.3 kg (113 lb) 58.7 kg (129 lb 6.4 oz) Intake/Output Summary:  (Last 24 hours)    Intake/Output Summary (Last 24 hours) at 08/27/2023 1434  Last data filed at 08/26/2023 2100  Gross per 24 hour   Intake --   Output 400 ml   Net -400 ml     Stool Occurrence: 0        Physical Exam  General: alert, oriented, no acute distress, appears stated age  Head: normocephalic, atraumatic, no obvious abnormality  Eyes: conjunctivae icteric, PERRL  Nose: nares normal  Throat: normal oropharynx.  Lips and tongue normal.   Lungs: clear to auscultation bilaterally  Heart: normal rate, regular rhythm.  S1 and S2 normal.  No murmur.   Abdomen: soft, non tender, non distended, normoactive bowel sounds.    Extremities:  1+ lower extremity edema  Peripheral pulses: 2+ radial and dorsalis pedis bilaterally   Skin: dry, jaundiced  Psych: mood good       Lab Review  24-hour labs:    Results for orders placed or performed during the hospital encounter of 08/20/23 (from the past 24 hours)   SODIUM-URINE RANDOM    Collection Time: 08/26/23  3:09 PM   Result Value Ref Range    Urine Sodium 64 mmol/L   CREATININE-URINE RANDOM    Collection Time: 08/26/23  3:09 PM   Result Value Ref Range    Urine Creatinine 73 mg/dL   URINALYSIS DIPSTICK    Collection Time: 08/26/23  3:09 PM   Result Value Ref Range    Color,UA Amber     Turbidity,UA Clear Clear    Specific Gravity-Urine 1.017 1.005 - 1.030    pH,UA 6.0 5.0 - 8.0    Protein,UA 1+ (A) Negative    Glucose,UA 1+ (A) Negative    Ketones,UA Negative Negative    Bilirubin,UA Positive (A) Negative    Blood,UA Negative Negative    Urobilinogen,UA Normal Normal    Nitrite,UA Negative Negative    Leukocytes,UA Trace (A) Negative    Urine Ascorbic Acid, UA Negative Negative   URINALYSIS, MICROSCOPIC    Collection Time: 08/26/23  3:09 PM   Result Value Ref Range    WBCs,UA 11 - 20 (A) None, 0 - 2  /HPF    RBCs,UA 2 - 10 (A) None, 0 - 2  /HPF    Mucous,UA Trace None, Trace /LPF    Bacteria,UA Few (A) None /HPF    Yeast,UA Packed (A) None /HPF Squamous Epithelial Cells 0 - 2 None, 0 - 2 , 2 - 5 /HPF   PROTIME INR (PT)    Collection Time: 08/27/23  4:33 AM   Result Value Ref Range    Protime 12.5 9.9 - 14.2 Seconds    INR 1.1 0.9 - 1.2   CBC AND DIFF    Collection Time: 08/27/23  4:33 AM   Result Value Ref Range    White Blood Cells 19.90 (H) 4.50 - 11.00 10*3/uL    Red Blood Cells 2.70 (L) 4.00 - 5.00 10*6/uL    Hemoglobin 8.7 (L) 12.0 - 15.0 g/dL    Hematocrit 74.8 (L) 36.0 - 45.0 %    MCV 93.1 80.0 - 100.0 fL    MCH 32.1 26.0 - 34.0 pg    MCHC 34.5 32.0 - 36.0 g/dL    RDW 78.9 (H) 88.9 - 15.0 %    Platelet Count 149 (L) 150 - 400 10*3/uL    MPV 9.9 7.0 - 11.0 fL   COMPREHENSIVE METABOLIC PANEL    Collection Time: 08/27/23  4:33 AM   Result Value Ref Range    Sodium 138 137 - 147 mmol/L    Potassium 3.7 3.5 - 5.1 mmol/L    Chloride 103 98 - 110 mmol/L    Glucose 183 (H) 70 - 100 mg/dL    Blood Urea Nitrogen 41 (H) 7 - 25 mg/dL    Creatinine 6.71 (H) 0.40 - 1.00 mg/dL    Calcium  8.2 (L) 8.5 - 10.6 mg/dL    Total Protein 5.9 (L) 6.0 - 8.0 g/dL    Total Bilirubin 83.0 (H) 0.2 - 1.3 mg/dL    Albumin  2.6 (L) 3.5 - 5.0 g/dL    Alk Phosphatase 668 (H) 25 - 110 U/L    AST 79 (H) 7 - 40 U/L  ALT 62 (H) 7 - 56 U/L    CO2 24 21 - 30 mmol/L    Anion Gap 11 3 - 12    Glomerular Filtration Rate (GFR) 15 (L) >60 mL/min   MAGNESIUM     Collection Time: 08/27/23  4:33 AM   Result Value Ref Range    Magnesium  1.7 1.6 - 2.6 mg/dL   PHOSPHORUS    Collection Time: 08/27/23  4:33 AM   Result Value Ref Range    Phosphorus 1.8 (L) 2.0 - 4.5 mg/dL   MANUAL DIFF    Collection Time: 08/27/23  4:33 AM   Result Value Ref Range    ANISO Present     HYPO Present     POLY Present     Target Present     Platelet Estimate Slight Decrease     Segmented Neutrophils 69 41 - 77 %    Lymphocytes 5 (L) 24 - 44 %    Monocytes 11 4 - 12 %    Eosinophil 4 0 - 5 %    Metamyelocyte 4 %    Myelocyte 3 %    Bands 4 0 - 10 %    Absolute Neutrophil Count Manual 15.3 (H) 1.8 - 7.0 10*3/uL    nRBCs (Manual Diff) 8 10*3/uL       Point of Care Testing  (Last 24 hours)  Glucose: (!) 183 (08/27/23 0433)    Radiology and other Diagnostics Review:    tele: sinus-sinus brady      Vicki LELON Phenes, MD   Pager [phone]

## 2023-08-28 ENCOUNTER — Inpatient Hospital Stay: Admit: 2023-08-28 | Discharge: 2023-08-28 | Payer: MEDICARE

## 2023-08-28 ENCOUNTER — Encounter: Admit: 2023-08-28 | Discharge: 2023-08-28 | Payer: MEDICARE

## 2023-08-28 LAB — MANUAL DIFF
~~LOC~~ BKR LYMPHOCYTES - RELATIVE: 14 % — ABNORMAL LOW (ref 24–44)
~~LOC~~ BKR METAMYELOCYTES - RELATIVE: 4 % (ref 0–5)
~~LOC~~ BKR MONOCYTES - RELATIVE: 5 % (ref 4–12)
~~LOC~~ BKR MYELOCYTES - RELATIVE: 1 %
~~LOC~~ BKR NEUT+BANDS - ABSOLUTE: 17 10*3/uL — ABNORMAL HIGH (ref 1.8–7.0)
~~LOC~~ BKR NEUTROPHILS - RELATIVE: 76 % (ref 41–77)
~~LOC~~ BKR NUCLEATED RBC MANUAL: 8 10*3/uL — ABNORMAL HIGH (ref 1.8–7.0)

## 2023-08-28 LAB — HEMOGLOBIN & HEMATOCRIT
~~LOC~~ BKR HEMATOCRIT: 26 % — ABNORMAL LOW (ref 36.0–45.0)
~~LOC~~ BKR HEMOGLOBIN: 9 g/dL — ABNORMAL LOW (ref 12.0–15.0)

## 2023-08-28 LAB — PROTIME INR (PT)
~~LOC~~ BKR INR: 1.1 g/dL — ABNORMAL LOW (ref 0.9–1.2)
~~LOC~~ BKR PROTIME: 12 s — ABNORMAL LOW (ref 9.9–14.2)

## 2023-08-28 LAB — CBC AND DIFF: ~~LOC~~ BKR WBC COUNT: 23 10*3/uL — ABNORMAL HIGH (ref 4.50–11.00)

## 2023-08-28 MED ORDER — LACTATED RINGERS IV BOLUS
1000 mL | Freq: Once | INTRAVENOUS | 0 refills | Status: CP
Start: 2023-08-28 — End: ?
  Administered 2023-08-28: 21:00:00 1000 mL via INTRAVENOUS

## 2023-08-28 MED ORDER — FENTANYL CITRATE (PF) 50 MCG/ML IJ SOLN
25 ug | Freq: Once | INTRAVENOUS | 0 refills | Status: CP
Start: 2023-08-28 — End: ?
  Administered 2023-08-28: 18:00:00 25 ug via INTRAVENOUS

## 2023-08-28 MED ORDER — SODIUM CHLORIDE 0.9 % IJ SYRG
10 mL | Freq: Two times a day (BID) | 0 refills | Status: DC
Start: 2023-08-28 — End: 2023-09-08
  Administered 2023-08-28 – 2023-09-08 (×21): 10 mL

## 2023-08-28 MED ORDER — PANTOPRAZOLE 40 MG IV SOLR
40 mg | Freq: Two times a day (BID) | INTRAVENOUS | 0 refills | Status: DC
Start: 2023-08-28 — End: 2023-09-08
  Administered 2023-08-29 – 2023-09-08 (×22): 40 mg via INTRAVENOUS

## 2023-08-28 MED ORDER — IOHEXOL 300 MG IODINE/ML IV SOLN
30 mL | Freq: Once | INTRAVENOUS | 0 refills | Status: CP
Start: 2023-08-28 — End: ?
  Administered 2023-08-28: 14:00:00 30 mL via INTRAVENOUS

## 2023-08-28 MED ORDER — AMPICILLIN/SULBACTAM 3G/100ML NS IVPB (MB+)
3 g | Freq: Once | INTRAVENOUS | 0 refills | Status: CP
Start: 2023-08-28 — End: ?
  Administered 2023-08-28 (×2): 3 g via INTRAVENOUS

## 2023-08-28 MED ADMIN — PROPOFOL 10 MG/ML IV EMUL [11150]: 80 ug/kg/min | INTRAVENOUS | @ 14:00:00 | Stop: 2023-08-28 | NDC 00409469953

## 2023-08-28 NOTE — Progress Notes
 Biliary Drain placed in IR    [employer] (drain supply) paperwork filled out by this RN and placed in front of patient physical chart. If drain remains in place at discharge, form to be faxed, along with copy of patient face sheet and procedure note, to Jeana (fax: [phone]) by case manager. Prior to discharge home, inpatient RN should first verify that drain supplies are present in patient room, if no supplies present contact materials management (STAT Desk) [phone] and ask for drain supply bag and ensure saline flush prescription is present at discharge. If the patient discharges on a weekend contact materials management (STAT Desk) [phone] for drain supply bag, and ensure saline flush prescription is present.     Drain flushing and site care reviewed with patient and spouse, [name] at this time. Inpatient RN confirm understanding drain care instructions at time of discharge. Written instructions should be viewable in AVS at discharge.

## 2023-08-28 NOTE — Progress Notes
 Patient signed out of anesthesia at this time by Dr. Pozek.

## 2023-08-28 NOTE — Progress Notes
 PHYSICAL THERAPY  NOTE      Name: Vicki Buchanan   MRN: [id number]     DOB: [birth year]      Age: 68 y.o.  Admission Date: 08/20/2023     LOS: 8 days     Date of Service: 08/28/2023      Patient off unit for procedure at time of attempt for physical therapy. PT to follow up as indicated.    Therapist: Izetta Free, PT  Date: 08/28/2023

## 2023-08-28 NOTE — Unmapped
 Immediate Post Procedure Note    Date:  08/28/2023                                         Performing Provider:  Chauncey ONEIDA Needles, MD    Consent:  Consent obtained from patient.  Time out performed: Consent obtained, correct patient verified, correct procedure verified, correct site verified, patient marked as necessary.  Pre/Post Procedure Diagnosis:  Hyperbilirubinemia  Indications:  Above      Procedure(s):  Percutaneous biliary tube placement  Findings:      Dilated left biliary system. Successful placement of left biliary int/ext biliary tube    Estimated Blood Loss:  None/Negligible  Specimen(s) Removed/Disposition:  None  Complications: None  Patient Tolerated Procedure: Well  Post-Procedure Condition:  stable    Chauncey ONEIDA Needles, MD  Pager

## 2023-08-28 NOTE — Progress Notes
 Anesthesia staff present to monitor patient airway, vital signs, and medications. See anesthesia docflow. This RN will assist as needed.

## 2023-08-28 NOTE — Consults
 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  Obstructive biliary tree; slender left intrahepatic ducts; tumor ingrowth into midportion of metal stent; LHD opacified with difficulty  08/03/2023 admitted to [facility] with acute cholangitis  Started Zosyn   08/04/2023 urine culture: >100K CFU Enterococcus faecalis (S) ampicillin   08/06/2023 underwent ERCP which revealed indwelling uncovered metal biliary stent in the left hepatic duct which was exchanged with plastic stent, mild stenosis within the metal stent at the proximal main bile duct, diffuse malignant stenoses of intrahepatic ducts; presence of purulence  08/07/2023 discontinued Zosyn ; started Augmentin ; discharged home  08/16/2023 underwent ERCP which revealed a protruding indwelling metallic stent in CBD seen coming out from ampulla with a plastic stent inside the metal stent, plastic stent was removed, occlusion cholangiogram: right posterior intrahepatic duct opacified; left hepatic system not opacified and totally; right anterior intrahepatic with branches partially opacified with significant narrowing beyond indwelling stent up to the hilum of liver; palced a Habiba catheter size 8 French, 2.7 and RFA x 2; large amount of tumor ingrowth inside the indwelling stent with large amount of tissue growing at the entrance of the stent which again this area also tumor destruction applied using RFA x 2; placed a 10 French, 10 cm double-pigtail plastic stent was successfully placed into the right intrahepatic duct; good outflow of bile and contrast seen through both the stents  08/20/2023 seen in oncology clinic [facility], noted to have persistent jaundice status post ERP and stent placement, received cefepime  x 1 dose  08/20/23: Directly admitted to the hospitalist service  Blood cultures: No growth x 2 sets  Urine culture: No growth  Started Zosyn   08/21/23 ERCP: uncovered metal stent in CBD extending out of ampulla with a double-pigtail plastic stent through the lumen of the uncovered metal stent, double-pigtail plastic stent removed, stone extraction balloon passed into left intrahepatic duct, balloon sweeps with extraction of a large amount of sludge, food debris and stent debris;  occlusion cholangiogram: Narrowing within the midportion of the stent consistent with tumor/tissue ingrowth.  RHD mildly dilated without stricture.  LHD opacified with difficulty.  Left intrahepatic ducts were slender and not well-visualized.  Palced 10 mm x 8 cm VIABIL stent within the previously placed uncovered biliary stent, upper end of the VIABIL stent was below the hilar confluence, good outflow of contrast after stent deployment   08/23/2023 CT ABD/pelvis: Development of small left pleural effusion and minimal basilar   bronchiolitis. No small bowel obstruction, ascites, or free air. Redemonstration of extensive partially calcified peritoneal metastatic disease. New small subcutaneous nodule right lateral pelvis which is probably a new metastasis. Right ischial pressure ulcer.   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 with subsequent removal of ureteral stents  02/2017 diagnosed on laparoscopy  04/03/2017 underwent loop ileostomy creation  12/2017 started epacadosta and sirolimus   05/2018 discontinued epacadosta and sirolimus   05/27/2020 underwent laparoscopic HIPEC with mitomycin-C (trial at [facility])  10/07/2018 underwent second laparoscopic HIPEC  09/12/2018 underwent tumor debulking [facility])   05/2020 underwent radiation therapy  to right lung metastases  04/16/2020 underwent cytoreductive surgery with ostomy revision due to tumor invasion  09/2020 started FOLFIRI and bevacizumab    09/07/2021 MRI/MRCP: Slight apparent increase in diffuse intrahepatic and extrahepatic biliary ductal dilatation to the level of the porta hepatis where there is   extrinsic compression on the extrahepatic common duct from metastases. No   biliary filling defect to suggest choledocholithiasis or biliary mass. Grossly unchanged extensive peritoneal carcinomatosis and right abdominal wall metastases. Partially visualized indeterminate lesion in the left sacrum, which may   be a metastasis or hemangioma.   09/16/2021 underwent ERCP this month of an uncovered metal stent in the CBD  09/2021 held bevacizumab    08/04/2023 CT ABD/pelvis: Extensive metastatic disease throughout the abdomen and pelvis including peritoneal implants with mass effect on adjacent structures. Left posterior pleural implant with invasion of the chest wall and adjacent smaller nodules. Common bile duct and biliary stent in place. Moderate to intrahepatic biliary ductal dilatation. Right lower quadrant ostomy encased with implants. No gas-filled dilated loops of bowel to suggest obstruction. Mild left hydronephrosis with prominent left extrarenal pelvis. Diffuse bladder wall thickening with adjacent fat stranding, possibly secondary to acute cystitis. Correlation with urinalysis is recommended. Distended vaginal cuff with air-fluid level. The posterior wall of the urinary bladder is adjacent to the vaginal cuff without preserved fat plane of separation.   08/20/2023 CEA 2565.5  Currently FOLFIRI cycle 40     CKD, stage IV     Recurrent urinary tract infections     Pelvic abscess following tumor debulking  10/2018 abscess cultures: Serratia and Candida albicans  Underwent IR abscess drain placement  Received course of cefepime  and fluconazole      Asplenia, surgical  02/2018 Pneumovax   09/2018 PCV13 (Prevnar)   09/2018 Haemophilus influenzae type B   Meningococcal conjugate vaccine (Menveo) 09/30/2018 and 11/2018  Meningococcal serogroup B vaccine (MenB) 09/30/2018 and 10/28/2018    Recommendations:     Recent ERCPs confirmed further tumor growth with some obstruction of the existing metal stent within the CBD as well as a partially obstructed left hepatic ductal system, as evidenced on the cholangiogram.    Over the last several days, patient has not had fever but has developed progressive leukocytosis and had persistent elevation of hepatic transaminases, alkaline phosphatase and total bilirubin.  Today underwent placement of internal/external biliary drain into the left hepatic ductal system to decompress.    Agree with ampicillin /sulbactam.  Anticipate she will need several days of therapy following placement of the central/external drain.  We will need to monitor the drain output to ensure adequate decompression of the left biliary system.  Will monitor for fever.  We will also continue to monitor WBC and liver enzymes.    We will continue to follow the patient with you.          Interval Hx   Patient with history of mucinous adenocarcinoma of the appendix with diffuse metastases.  This was diagnosed in 2018.  She developed malignant ureteral obstructions requiring ureteral stents with subsequent removal.  She has been followed in Oncology clinic.  In 2002, she underwent laparoscopic HIPEC with Mitomycin-C (trial at [facility]), with a second procedure later in July 2020.  She would undergo additional cytoreductive surgeries in 2002.    Recently admitted to the hospital at [facility] with acute cholangitis.  ERCP evaluation and intervention noted.    Patient was readmitted on 6/2 due to persistent jaundice post ERCP.  CT imaging from 08/23/2023  in March 2025 as well as imaging from recent ERCP on 08/28/2023 reviewed.    Patient was started on pip-tazo at the time of admission on 6/2.  Afebrile throughout this hospitalization.    Persistent leukocytosis.  In April, WBC was normal.  On 5/27, WBC = 15.5.  This had increased to 20.2 by the time of presentation on 6/2.  WBC improved to 14.9 (6/6) at the time pip-tazo was discontinued.    Since that time, she has remained afebrile but leukocytosis has continually worsened, now with WBC = 23.  Neutrophils 76%.    Creatinine = 3.15  Sodium/potassium normal  Over the last 4 days, mild increase and AST, with stable elevated ALT.  Alkaline phosphatase = 323  Total bili uptrending over the last 4 days (currently 18.4)    Today, patient went to IR for placement of internal/external biliary drain for decompression of the left hepatic ductal system.    Today, pain is fairly well-controlled.  She does have some aching at the site of the percutaneous internal/external biliary drain.  She is passing fecal material into her ostomy.  No nausea.  Appetite is fairly good.  No new shortness of breath or cough.  After placement of the internal/external biliary drain, they have noted some clots within the tubing.  They have flush these.    Antimicrobial Start date End date   cefepime  6/2 6/2   Pip/tazo 6/2 6/6   Amp/sulb 6/10 active                       Estimated Creatinine Clearance: 15.8 mL/min (A) (based on SCr of 3.15 mg/dL (H)).      Medications   Scheduled Meds:ERGOcalciferoL  (vitamin D2) (DRISDOL ) capsule 50,000 Units, 50,000 Units, Oral, Q7 Days  heparin  (porcine) PF syringe 5,000 Units, 5,000 Units, Subcutaneous, Q8H  pantoprazole  (PROTONIX ) injection 40 mg, 40 mg, Intravenous, QDAY  sodium chloride  0.9 % (flush) syringe 10 mL, 10 mL, Intra-catheter, BID    Continuous Infusions:  PRN and Respiratory Meds:acetaminophen  Q6H PRN, alteplase  BID PRN, alum-mag hydroxide-simeth Q4H PRN **AND** [EXPIRED] lidocaine  hcl viscous ONCE, calcium  carbonate Q6H PRN, cetirizine  QDAY PRN, diphenoxylate -atropine  QID PRN, fentaNYL  citrate PF Q3H while awake PRN, hydrOXYzine  HCL Q6H PRN, loperamide  QID PRN, melatonin QHS PRN, ondansetron  Q6H PRN **OR** ondansetron  (ZOFRAN ) IV Q6H PRN, oxyCODONE  Q4H PRN, polyethylene glycol 3350  QDAY PRN, sennosides-docusate sodium  QDAY PRN, trimethobenzamide  Q6H PRN      Allergies     Allergies   Allergen Reactions    Indomethacin  SEE COMMENTS     Pain for several days after suppository post ERCP. Tolerated IV but not PR    Shrimp SEE COMMENTS     Eye irritation. Namely blood vessel issues in the eyes        Physical Examination                          Vital Signs: Last                  Vital Signs: 24 Hour Range   BP: 156/74 (06/10 1045)  Temp: 36.5 ?C (97.7 ?F) (06/10 9053)  Pulse: 61 (06/10 1100)  Respirations: 14 PER MINUTE (06/10 1100)  SpO2: 97 % (06/10 1100)  O2 Device: Nasal cannula (06/10 1100)  O2 Liter Flow: 1 Lpm (06/10 1100) BP: (145-182)/(69-80)   Temp:  [36.3 ?C (97.4 ?F)-36.8 ?C (98.3 ?F)]   Pulse:  [59-71]  Respirations:  [11 PER MINUTE-18 PER MINUTE]   SpO2:  [92 %-98 %]   O2 Device: Nasal cannula  O2 Liter Flow: 1 Lpm     Gen: Awake, alert, NAD at rest.  Fatigued appearing.  General icterus  HEENT: EOMI, scleral icterus present, no visible thrush  Neck: Supple  Lungs: Clear  Heart: Regular  Abd: + BS, soft, mild tenderness in upper abdomen.  Anterior internal/external biliary drain in place.  Scant output into collection system.  Ostomy intact without surrounding tenderness  Ext: 1+ lower extremity edema  Skin: Generalized icterus, no acute rash  Musculoskel: No warm or swollen joints  Psych: Oriented x 3 with normal mood and affect    Lines:    Lab Review   Hematology  Recent Labs     08/26/23  0640 08/26/23  1110 08/27/23  0433 08/28/23  0411   WBC  --   --  19.90* 23.00*   HGB 8.3* 9.2* 8.7* 8.7*   HCT 23.9* 26.4* 25.1* 25.3*   PLTCT 186 149* 149* 153   INR 1.1  --  1.1 1.1     Chemistry  Recent Labs     08/26/23  0640 08/27/23  0433 08/28/23  0411   NA 139 138 138   K 3.9 3.7 3.6   CL 102 103 105   CO2 25 24 24    BUN 49* 41* 37*   CR 3.63* 3.28* 3.15*   GFR 13* 15* 16*   GLU 129* 183* 121*   CA 8.3* 8.2* 8.7   PO4 2.9 1.8* 2.3   ALBUMIN  2.7* 2.6* 2.5*   ALKPHOS 301* 331* 323*   AST 91* 79* 108*   ALT 70* 62* 73*   TOTBILI 16.5* 16.9* 18.4*       Microbiology, Radiology and other Diagnostics Review   Microbiology data reviewed.    Pertinent radiology images viewed.       Garnette Samuel, MD   Contact via Voalte  Transplant & Immunocompromised Infectious Diseases

## 2023-08-28 NOTE — Progress Notes
 General Progress Note    Name: Vicki Buchanan        MRN: [id number]          DOB: 12-28-1955            Age: 68 y.o.  Admission Date: 08/20/2023       LOS: 8 days    Date of Service: 08/28/2023    Assessment/Plan:    Assessment & Plan  Cholangitis (CMS-HCC)    Peritoneal carcinomatosis (CMS-HCC)    Cancer of appendix (CMS-HCC)    Hyperbilirubinemia    Vicki Buchanan is a 68yoF with metastatic mucinous adenocarcinoma of the appendix (peritoneal mets, TAH/BSO, diverting loop ileostomy, malignant biliary obstruction) who presented 08/20/23 with anemia, hyperbilirubinemia, and leukocytosis and was admitted with cholangitis. She underwent ERCP 08/21/23 with viabil stent placement in the ampulla, complicated by post ERCP pancreatitis.  She has also had hypotension requiring significant IV fluids (she gets 1-2L MWF as an outpatient).      Mucinous adenocarcinoma of the appendix  hyperbilirubinemia  malignant biliary obstruction  ascending cholangitis   post ERCP pancreatitis   - admitted with rising bili, leukocytosis, anemia  - oncology consulted, appreciate assistance  - recently hospitalized at [facility] with pus noted to be coming out of her bile ducts during ERCP 08/06/23  - biliary consulted, s/p ERCP 08/21/23: Uncovered metal stent coming out of the ampulla with double-pigtail plastic stent going through the lumen of the metal stent.  Food and stent debris seen in stent lumen.  Occlusion cholangiogram performed that showed narrowing in the midportion of the stent consistent with tumor/tissue ingrowth.  Right hepatic duct mildly dilated without any obvious stricture.  Left intrahepatic ducts normal-appearing.  A 10 mm x 8 cm viabil stent was deployed within the previously placed uncovered biliary stent with good outflow of contrast.   - CT A/P 6/5:   Development of small left pleural effusion and minimal basilar bronchiolitis.  No small bowel obstruction, ascites, or free air.  Redemonstration of extensive partially calcified peritoneal metastatic disease.   New small subcutaneous nodule right lateral pelvis which is probably a new metastasis.   Right ischial pressure ulcer.   - abdominal pain, elevated lipase post ERCP: post-procedure pancreatitis  - continue aggressive IVF  - monitor LFTs, bilirubin--bili not significantly lower post ERCP, may need further drainage via IR  - continue analgesics  - ID consulted  - continue zosyn  (6/2--6  - blood cultures 6/2 with NGTD   > PTC ordered in IR to access smaller peripheral intrahepatic ducts and potentially help drain the left system. IR to discuss with GI. Bedside nursing had trouble with flushing the drain on 6/10. Will CTM    Leukocytosis  > discussed with ID who was consulted, but signed off. They requested a new consult on 6/10.  > re-consult ID per recommendation of Dr. Mable.     Anemia  > 1 unit prbcs ordered on 6/7.     Hypotension--improved   - Repeated rapid responses 6/5-6/6 for hypotension, received 5L IVF  - Echo 08/22/23: The left ventricular size is normal. The left ventricular wall thickness is normal. Normal geometry.  The left ventricular systolic function is normal. The ejection fraction by Simpson's biplane method is 61%. There are no segmental wall motion abnormalities. Normal left ventricular diastolic function.  The right ventricular size is normal. The right ventricular systolic function is normal.  Nonspecific thickening of the mitral valve with mild to moderate regurgitation, no stenosis.  IVC was  not visualized on the study.  No pericardial effusion.  - hold home hydralazine , carvedilol   - stop IVF  - patient gets 1-2L IV fluids MWF as an outpatient, will need to watch her ins/outs     AKI on CKD4  chronic mild L hydronephrosis   - follows with Dr. Marlo as outpatient    GERD  - continue PPI  - add PRN GI cocktail     FEN:   Fluids: stop LR   Monitor and replace electrolytes as needed  Diet: cardiac     DVT PPX: heparin  SQ     CODE STATUS: FULL CODE   NOK: husband, updated at bedside today     Disposition:  continue admission     Today, 08/28/2023, I personally reviewed vitals, labs, imaging, telemetry, nursing notes, and consultant notes.  I also discussed this patient's care with the patient, patient's family, the Child psychotherapist, the case manager, the pharmacist, and the consultant from nephrology.  Furthermore, I ordered labs as part of continued management of 68-year-old Vicki Buchanan.      _______________________________________________________________________    Subjective   Vicki Buchanan is a 68 y.o. female.  Patient has elevated white count today. Discussed with patient. No new localizing features.      Medications  Scheduled Meds:ERGOcalciferoL  (vitamin D2) (DRISDOL ) capsule 50,000 Units, 50,000 Units, Oral, Q7 Days  heparin  (porcine) PF syringe 5,000 Units, 5,000 Units, Subcutaneous, Q8H  pantoprazole  (PROTONIX ) injection 40 mg, 40 mg, Intravenous, QDAY  sodium chloride  0.9 % (flush) syringe 10 mL, 10 mL, Intra-catheter, BID    Continuous Infusions:      PRN and Respiratory Meds:acetaminophen  Q6H PRN, alteplase  BID PRN, alum-mag hydroxide-simeth Q4H PRN **AND** [EXPIRED] lidocaine  hcl viscous ONCE, calcium  carbonate Q6H PRN, cetirizine  QDAY PRN, diphenoxylate -atropine  QID PRN, fentaNYL  citrate PF Q3H while awake PRN, hydrOXYzine  HCL Q6H PRN, loperamide  QID PRN, melatonin QHS PRN, ondansetron  Q6H PRN **OR** ondansetron  (ZOFRAN ) IV Q6H PRN, oxyCODONE  Q4H PRN, polyethylene glycol 3350  QDAY PRN, sennosides-docusate sodium  QDAY PRN, trimethobenzamide  Q6H PRN      Objective                        Vital Signs: Last Filed                 Vital Signs: 24 Hour Range   BP: 156/74 (06/10 1045)  Temp: 36.5 ?C (97.7 ?F) (06/10 9053)  Pulse: 61 (06/10 1100)  Respirations: 14 PER MINUTE (06/10 1100)  SpO2: 97 % (06/10 1100)  O2 Device: Nasal cannula (06/10 1100)  O2 Liter Flow: 1 Lpm (06/10 1100) BP: (145-182)/(69-80)   Temp:  [36.3 ?C (97.4 ?F)-36.8 ?C (98.3 ?F)]   Pulse:  [59-71]   Respirations:  [11 PER MINUTE-18 PER MINUTE]   SpO2:  [92 %-98 %]   O2 Device: Nasal cannula  O2 Liter Flow: 1 Lpm   Intensity Pain Scale (Self Report): 4 (08/28/23 1153) Vitals:    08/22/23 1143 08/22/23 1503 08/24/23 0557   Weight: 51.3 kg (113 lb) 51.3 kg (113 lb) 58.7 kg (129 lb 6.4 oz)         Intake/Output Summary:  (Last 24 hours)    Intake/Output Summary (Last 24 hours) at 08/28/2023 1359  Last data filed at 08/28/2023 0938  Gross per 24 hour   Intake 680 ml   Output 50 ml   Net 630 ml     Stool Occurrence: 0        Physical  Exam  General: alert, oriented, no acute distress, appears stated age  Head: normocephalic, atraumatic, no obvious abnormality  Eyes: conjunctivae icteric, PERRL  Nose: nares normal  Throat: normal oropharynx.  Lips and tongue normal.   Lungs: clear to auscultation bilaterally  Heart: normal rate, regular rhythm.  S1 and S2 normal.  No murmur.   Abdomen: soft, non tender, non distended, normoactive bowel sounds.    Extremities: 1+ lower extremity edema  Peripheral pulses: 2+ radial and dorsalis pedis bilaterally   Skin: dry, jaundiced  Psych: mood good       Lab Review  24-hour labs:    Results for orders placed or performed during the hospital encounter of 08/20/23 (from the past 24 hours)   PROTIME INR (PT)    Collection Time: 08/28/23  4:11 AM   Result Value Ref Range    Protime 12.5 9.9 - 14.2 Seconds    INR 1.1 0.9 - 1.2   CBC AND DIFF    Collection Time: 08/28/23  4:11 AM   Result Value Ref Range    White Blood Cells 23.00 (H) 4.50 - 11.00 10*3/uL    Red Blood Cells 2.64 (L) 4.00 - 5.00 10*6/uL    Hemoglobin 8.7 (L) 12.0 - 15.0 g/dL    Hematocrit 74.6 (L) 36.0 - 45.0 %    MCV 95.8 80.0 - 100.0 fL    MCH 32.9 26.0 - 34.0 pg    MCHC 34.4 32.0 - 36.0 g/dL    RDW 78.8 (H) 88.9 - 15.0 %    Platelet Count 153 150 - 400 10*3/uL    MPV 9.8 7.0 - 11.0 fL   COMPREHENSIVE METABOLIC PANEL    Collection Time: 08/28/23  4:11 AM   Result Value Ref Range    Sodium 138 137 - 147 mmol/L    Potassium 3.6 3.5 - 5.1 mmol/L    Chloride 105 98 - 110 mmol/L    Glucose 121 (H) 70 - 100 mg/dL    Blood Urea Nitrogen 37 (H) 7 - 25 mg/dL    Creatinine 6.84 (H) 0.40 - 1.00 mg/dL    Calcium  8.7 8.5 - 10.6 mg/dL    Total Protein 5.8 (L) 6.0 - 8.0 g/dL    Total Bilirubin 81.5 (H) 0.2 - 1.3 mg/dL    Albumin  2.5 (L) 3.5 - 5.0 g/dL    Alk Phosphatase 676 (H) 25 - 110 U/L    AST 108 (H) 7 - 40 U/L    ALT 73 (H) 7 - 56 U/L    CO2 24 21 - 30 mmol/L    Anion Gap 9 3 - 12    Glomerular Filtration Rate (GFR) 16 (L) >60 mL/min   MAGNESIUM     Collection Time: 08/28/23  4:11 AM   Result Value Ref Range    Magnesium  1.6 1.6 - 2.6 mg/dL   PHOSPHORUS    Collection Time: 08/28/23  4:11 AM   Result Value Ref Range    Phosphorus 2.3 2.0 - 4.5 mg/dL   MANUAL DIFF    Collection Time: 08/28/23  4:11 AM   Result Value Ref Range    ANISO Present     POIK Present     Target Present     Platelet Estimate Normal     Segmented Neutrophils 76 41 - 77 %    Lymphocytes 14 (L) 24 - 44 %    Monocytes 5 4 - 12 %    Metamyelocyte 4 %    Myelocyte 1 %  Absolute Neutrophil Count Manual 17.5 (H) 1.8 - 7.0 10*3/uL    nRBCs (Manual Diff) 8 10*3/uL       Point of Care Testing  (Last 24 hours)  Glucose: (!) 121 (08/28/23 0411)    Radiology and other Diagnostics Review:    tele: sinus-sinus brady      Vicki LELON Phenes, MD   Pager [phone]

## 2023-08-29 ENCOUNTER — Encounter: Admit: 2023-08-29 | Discharge: 2023-08-29 | Payer: MEDICARE

## 2023-08-29 LAB — COMPREHENSIVE METABOLIC PANEL: ~~LOC~~ BKR POTASSIUM: 4 mmol/L — ABNORMAL LOW (ref 3.5–5.1)

## 2023-08-29 LAB — CBC AND DIFF
~~LOC~~ BKR HEMATOCRIT: 23 % — ABNORMAL LOW (ref 36.0–45.0)
~~LOC~~ BKR RBC COUNT: 2.4 10*6/uL — ABNORMAL LOW (ref 4.00–5.00)

## 2023-08-29 MED ORDER — MAGNESIUM SULFATE IN D5W 1 GRAM/100 ML IV PGBK
1 g | INTRAVENOUS | 0 refills | Status: CP
Start: 2023-08-29 — End: ?
  Administered 2023-08-29: 18:00:00 1 g via INTRAVENOUS

## 2023-08-29 MED ORDER — SODIUM CHLORIDE 0.9% IV SOLP
Freq: Once | INTRAVENOUS | 0 refills | Status: CP
Start: 2023-08-29 — End: ?
  Administered 2023-08-29: 18:00:00 1000.0000 mL via INTRAVENOUS

## 2023-08-29 MED ORDER — AMPICILLIN/SULBACTAM 3G/100ML NS IVPB (MB+)
3 g | Freq: Two times a day (BID) | INTRAVENOUS | 0 refills | Status: DC
Start: 2023-08-29 — End: 2023-09-02
  Administered 2023-08-29 – 2023-09-02 (×16): 3 g via INTRAVENOUS

## 2023-08-29 NOTE — Consults
 IR Pre-Procedure History and Physical/Sedation Plan    Procedure Date: 08/30/23     Planned Procedure(s):  Biliary drain check and possible upsize     Procedural code status: Full Code    Indication:  Biliary drain placed 6/10 without any bilious output, rising WBCs   __________________________________________________________________    Chief Complaint:  Abnormal labs     History of Present Illness: Vicki Buchanan is a 68 y.o. female with a history as listed below who is scheduled for biliary drain check and treat  2/2 rising WBC, persistent hyperbilirubinemia and no bilious output in drainage bag. She is agreeable to biliary drain check with possible upsize or exchange.     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/2017    Severe malnutrition 04/04/2017    SBO (small bowel obstruction) (CMS-HCC) 03/23/2017    Peritoneal carcinomatosis (CMS-HCC) 03/07/2017     Past Medical History:    Acid reflux    Allergy    Bundle branch block    Cancer (CMS-HCC)    Cancer of appendix (CMS-HCC)    Cancer of colon (CMS-HCC)    Pneumonia    Sarcoma (CMS-HCC)      Surgical History:   Procedure Laterality Date    CESAREAN SECTION  1981    HX CHOLECYSTECTOMY  1995    LAPAROSCOPY  02/2017    biopsy peritoneal    COLONOSCOPY DIAGNOSTIC WITH SPECIMEN COLLECTION BY BRUSHING/ WASHING - FLEXIBLE N/A 03/19/2017    Performed by Malissa Norleen LABOR, MD at [facility] ENDO    COLONOSCOPY WITH BIOPSY - FLEXIBLE  03/19/2017    Performed by Malissa Norleen LABOR, MD at [facility] ENDO    EXPLORATORY LAPAROTOMY, DIVERTING LOOP ILEOSTOMY N/A 04/03/2017 Performed by Al-Kasspooles, Mazin, MD at [facility] OR    INSERTION TUNNELED CENTRAL VENOUS CATHETER - AGE 55 YEARS AND OVER Left 04/23/2017    Performed by Al-Kasspooles, Mazin, MD at [facility] OR    CYSTOURETHROSCOPY WITH INDWELLING URETERAL STENT INSERTION Bilateral 04/09/2018    Performed by Erskin Lenis, MD at [facility] OR    CYSTOURETHROSCOPY WITH URETERAL CATHETERIZATION WITH/ WITHOUT IRRIGATION/ INSTILLATION/ URETEROPYELOGRAPHY Bilateral 04/09/2018    Performed by Erskin Lenis, MD at [facility] OR    CYSTOURETHROSCOPY WITH INDWELLING URETERAL STENT EXCHANGE (RIGHT 6 x 26cm / LEFT 6 x 28cm) Bilateral 08/08/2018    Performed by Erskin Lenis, MD at [facility] OR    RETROGRADE UROGRAPHY WITH/ WITHOUT KUB Bilateral 08/08/2018    Performed by Erskin Lenis, MD at [facility] OR    ENDOSCOPIC RETROGRADE CHOLANGIOPANCREATOGRAPHY WITH SPHINCTEROTOMY/ PAPILLOTOMY N/A 09/16/2021    Performed by Rogers Kipper, MD at [facility] ENDO    ESOPHAGOGASTRODUODENOSCOPY WITH ENDOSCOPIC ULTRASOUND EXAMINATION - FLEXIBLE  09/16/2021    Performed by Rogers Kipper, MD at [facility] ENDO    ENDOSCOPIC RETROGRADE CHOLANGIOPANCREATOGRAPHY WITH PLACEMENT ENDOSCOPIC STENT INTO BILIARY/ PANCREATIC DUCT - EACH STENT  09/16/2021    Performed by Rogers Kipper, MD at [facility] ENDO    COLONOSCOPY DIAGNOSTIC WITH SPECIMEN COLLECTION BY BRUSHING/ WASHING - FLEXIBLE N/A 09/26/2021  Performed by Rogers Kipper, MD at [facility] OR    ENDOSCOPIC RETROGRADE CHOLANGIOPANCREATOGRAPHY WITH PLACEMENT ENDOSCOPIC STENT INTO BILIARY/ PANCREATIC DUCT - EACH STENT  09/29/2021    Performed by Noralyn Blossom, MD at [facility] ENDO    ENDOSCOPIC RETROGRADE CHOLANGIOPANCREATOGRAPHY with plastic stent exchange N/A 10/27/2021    Performed by Noralyn Blossom, MD at [facility] ENDO    ENDOSCOPIC RETROGRADE CHOLANGIOPANCREATOGRAPHY WITH ABLATION TUMOR/ POLYP/ OTHER LESION  10/27/2021    Performed by Noralyn Blossom, MD at [facility] ENDO    ENDOSCOPIC RETROGRADE CHOLANGIOPANCREATOGRAPHY WITH REMOVAL CALCULI/ DEBRIS FROM BILIARY/ PANCREATIC DUCT  10/27/2021 Performed by Noralyn Blossom, MD at [facility] ENDO    ENDOSCOPIC RETROGRADE CHOLANGIOPANCREATOGRAPHY  plastic stent replacement N/A 12/22/2021    Performed by Noralyn Blossom, MD at [facility] ENDO    ENDOSCOPIC RETROGRADE CHOLANGIOPANCREATOGRAPHY N/A 03/30/2022    Performed by Noralyn Blossom, MD at [facility] ENDO    ENDOSCOPIC RETROGRADE CHOLANGIOPANCREATOGRAPHY WITH REMOVAL AND EXCHANGE OF STENT BILIARY/ PANCREATIC DUCT - EACH STENT EXCHANGED N/A 03/30/2022    Performed by Noralyn Blossom, MD at [facility] ENDO    ENDOSCOPIC RETROGRADE CHOLANGIOPANCREATOGRAPHY WITH REMOVAL AND EXCHANGE OF STENT BILIARY/ PANCREATIC DUCT - EACH STENT EXCHANGED N/A 07/27/2022    Performed by Noralyn Blossom, MD at [facility] ENDO    ENDOSCOPIC RETROGRADE CHOLANGIOPANCREATOGRAPHY N/A 11/02/2022    Performed by Noralyn Blossom, MD at [facility] ENDO    ENDOSCOPIC RETROGRADE CHOLANGIOPANCREATOGRAPHY WITH REMOVAL AND EXCHANGE OF STENT BILIARY/ PANCREATIC DUCT - EACH STENT EXCHANGED N/A 11/02/2022    Performed by Noralyn Blossom, MD at [facility] ENDO    ENDOSCOPIC RETROGRADE CHOLANGIOPANCREATOGRAPHY WITH REMOVAL AND EXCHANGE OF STENT BILIARY/ PANCREATIC DUCT - EACH STENT EXCHANGED N/A 01/25/2023    Performed by Noralyn Blossom, MD at [facility] ENDO    ENDOSCOPIC RETROGRADE CHOLANGIOPANCREATOGRAPHY WITH REMOVAL AND EXCHANGE OF STENT BILIARY/ PANCREATIC DUCT - EACH STENT EXCHANGED N/A 04/26/2023    Performed by Noralyn Blossom, MD at [facility] ENDO    ENDOSCOPIC RETROGRADE CHOLANGIOPANCREATOGRAPHY WITH REMOVAL AND EXCHANGE OF STENT BILIARY/ PANCREATIC DUCT - EACH STENT EXCHANGED N/A 07/26/2023    Performed by Noralyn Blossom, MD at [facility] ENDO    ENDOSCOPIC RETROGRADE CHOLANGIOPANCREATOGRAPHY WITH REMOVAL CALCULI/ DEBRIS FROM BILIARY/ PANCREATIC DUCT N/A 08/16/2023    Performed by Noralyn Blossom, MD at [facility] ENDO    ENDOSCOPIC RETROGRADE CHOLANGIOPANCREATOGRAPHY WITH REMOVAL AND EXCHANGE OF STENT BILIARY/ PANCREATIC DUCT - EACH STENT EXCHANGED N/A 08/16/2023    Performed by Noralyn Blossom, MD at [facility] ENDO    ENDOSCOPIC RETROGRADE CHOLANGIOPANCREATOGRAPHY, WITH NEOPLASM OR POLYP ABLATION N/A 08/16/2023    Performed by Noralyn Blossom, MD at [facility] ENDO    ENDOSCOPIC RETROGRADE CHOLANGIOPANCREATOGRAPHY WITH REMOVAL AND EXCHANGE OF STENT BILIARY/ PANCREATIC DUCT - EACH STENT EXCHANGED N/A 08/21/2023    Performed by Rogers Kipper, MD at [facility] ENDO    COLON SURGERY  7/20    COLON SURGERY  7/20    HX APPENDECTOMY  1/10    Guess on date    HX CESAREAN SECTION  4/81    HX HYSTERECTOMY  7/20    HX LOWER ANTERIOR RESECTION OF COLON      7/20    HX SALPINGO-OOPHORECTOMY      ILEOSTOMY OR JEJUNOSTOMY Right     LIVER DONOR SURGERY  12/24/21    Liver stents    OVARY SURGERY      Removed 7/20    PORTACATH PLACEMENT  1/20    PR PNCRTECT PROX STOT W/PANCREATOJEJUNOSTOMY  7/20    STOMACH SURGERY  7/20    TUNNELED VENOUS PORT PLACEMENT Left 1/19    URETER STENT PLACEMENT Bilateral       Social History     Tobacco Use    Smoking status: Never    Smokeless tobacco: Never   Substance Use Topics    Alcohol use: Not Currently     Comment: rarely      Family History   Problem Relation Name Age of Onset    Diabetes Mother Murray     Cancer Father Lamar     Diabetes Brother Octaviano     Cancer-Lung Paternal Aunt Doris     Cancer-Breast Maternal Grandmother Helen     Diabetes Paternal Grandmother Annabelle       Medications Prior to Admission   Medication Sig Dispense Refill Last Dose/Taking    acetaminophen /lidocaine /antacid(#) (GI COCKTAIL) 1:1:1 oral suspension Take 30 mL by mouth every 4 hours as needed. 900 mL 3 Taking As Needed    apap-maalox-lidocaine  1:3:1 oral suspension (MIXTURE COMPOUND) Take 30 mL by mouth every 4 hours as needed. (Patient taking differently: Take  by mouth every 4 hours as needed.) 900 mL 0 Taking Differently    calcium  carbonate (TUMS) 500 mg (200 mg elemental calcium ) chewable tablet Chew one tablet by mouth every 6 hours as needed. 100 tablet  Taking As Needed    carvediloL  (COREG ) 6.25 mg tablet Take one tablet by mouth twice daily. Take with food.  Hold if systolic blood pressure < 110 819 tablet 0 >1 Month    cetirizine  (ZYRTEC ) 10 mg tablet Take one tablet by mouth daily.   Taking    diphenoxylate -atropine  (LOMOTIL ) 2.5-0.025 mg tablet Take one tablet by mouth four times daily as needed for Diarrhea. 120 tablet 3 Taking As Needed    esomeprazole  DR (NEXIUM ) 20 mg capsule Take one capsule by mouth daily. Take on an empty stomach at least 1 hour before or 2 hours after food.   Taking    hydrOXYzine  HCL (ATARAX ) 25 mg tablet Take one tablet by mouth every 6 hours as needed for Itching.   Taking As Needed    [EXPIRED] levoFLOXacin  (LEVAQUIN ) 250 mg tablet Take three tablets by mouth daily for 1 day, THEN two tablets every 48 hours for 7 days. Take 3 tablets by mouth for 1 day then 48 hours later take 2 tablets every 48 hours for 7 days 9 tablet 0 08/19/2023 Evening    loperamide  (IMODIUM  A-D) 2 mg capsule Take one pill by mouth four times daily as needed 120 capsule 11 Taking    magnesium  oxide 400 mg magnesium  tablet Take one tablet by mouth twice daily. 90 tablet 11 Taking    ondansetron  (ZOFRAN  ODT) 8 mg rapid dissolve tablet DISSOLVE 1 TABLET UNDER THE TONGUE EVERY 8 HOURS AS NEEDED FOR NAUSEA AND VOMITING 60 tablet 0 Taking    potassium chloride  (KLOR-CON  SPRINKLE) 10 mEq capsule Take one capsule by mouth twice daily. If potassium < 3.5. Take with a meal and a full glass of water . 60 capsule 3 Past Month    sod chlor,sod bicarb/neti pot (SINUS WASH NETI POT SINI) Use as directed   Taking    traMADoL  (ULTRAM ) 50 mg tablet Take one tablet to two tablets by mouth daily as needed for Pain. 75 tablet 0 Taking As Needed     Allergies   Allergen Reactions    Indomethacin  SEE COMMENTS     Pain for several days after suppository post ERCP. Tolerated IV but not  PR    Shrimp SEE COMMENTS     Eye irritation. Namely blood vessel issues in the eyes        Review of Systems  Constitutional: + malaise   Ears, nose, mouth, throat, and face: negative  Respiratory: negative  Cardiovascular: negative  Gastrointestinal: negative  Musculoskeletal:negative  Neurological: negative  Behavioral/Psych: negative      Previous Personal Anesthetic/Sedation History:  Denies adverse events related to sedation/anesthesia.     Previous Family Anesthetic/Sedation History: Denies adverse events related to sedation/anesthesia.    Physical Exam:  Vital Signs: Last Filed In 24 Hours Vital Signs: 24 Hour Range   BP: 123/58 (06/11 1530)  Temp: 36.8 ?C (98.3 ?F) (06/11 1530)  Pulse: 65 (06/11 1530)  Respirations: 16 PER MINUTE (06/11 1530)  SpO2: 97 % (06/11 1530)  O2 Device: None (Room air) (06/11 1530) BP: (117-161)/(58-75)   Temp:  [36.6 ?C (97.8 ?F)-37.1 ?C (98.7 ?F)]   Pulse:  [65-77]   Respirations:  [15 PER MINUTE-16 PER MINUTE]   SpO2:  [95 %-98 %]   O2 Device: None (Room air)   Intensity Pain Scale (Self Report): 4 (08/29/23 0856)      General appearance: Alert and no distress noted; + jaundice  Neurologic: Grossly normal.  Lungs: Non labored at rest.  Heart: Regular rate and rhythm  Abdomen: Non-distended; + Biliary drain       Pre-procedure anxiolysis plan: Per Anesthesia  Sedation/Medication Plan: Per Anesthesia  Personal history of sedation complications: Per Anesthesia  Family history of sedation complications: Per Anesthesia  Medications for Reversal: Per Anesthesia  Discussion/Reviews:  Physician has discussed risks and alternatives of this type of sedation and above planned procedures with patient    NPO Status: Acceptable  Airway:  Per Anesthesia  Head and Neck: Per Anesthesia  Mouth: Per Anesthesia   Anesthesia Classification:  Per Anesthesia  Pregnancy Status: N/A      Lab/Radiology/Other Diagnostic Tests:  Labs:  24-hour labs:    Results for orders placed or performed during the hospital encounter of 08/20/23 (from the past 24 hours)   HEMOGLOBIN & HEMATOCRIT    Collection Time: 08/28/23  4:27 PM   Result Value Ref Range    Hemoglobin 9.0 (L) 12.0 - 15.0 g/dL    Hematocrit 73.7 (L) 36.0 - 45.0 %   PROTIME INR (PT)    Collection Time: 08/29/23  2:29 AM   Result Value Ref Range    Protime 13.3 9.9 - 14.2 Seconds    INR 1.2 0.9 - 1.2   CBC AND DIFF    Collection Time: 08/29/23  2:29 AM   Result Value Ref Range    White Blood Cells 38.80 (H) 4.50 - 11.00 10*3/uL    Red Blood Cells 2.48 (L) 4.00 - 5.00 10*6/uL    Hemoglobin 8.0 (L) 12.0 - 15.0 g/dL    Hematocrit 76.0 (L) 36.0 - 45.0 %    MCV 96.5 80.0 - 100.0 fL    MCH 32.5 26.0 - 34.0 pg    MCHC 33.6 32.0 - 36.0 g/dL    RDW 78.5 (H) 88.9 - 15.0 %    Platelet Count 157 150 - 400 10*3/uL    MPV 9.8 7.0 - 11.0 fL   COMPREHENSIVE METABOLIC PANEL    Collection Time: 08/29/23  2:29 AM   Result Value Ref Range    Sodium 138 137 - 147 mmol/L    Potassium 4.0 3.5 - 5.1 mmol/L    Chloride 103 98 -  110 mmol/L    Glucose 149 (H) 70 - 100 mg/dL    Blood Urea Nitrogen 36 (H) 7 - 25 mg/dL    Creatinine 6.88 (H) 0.40 - 1.00 mg/dL    Calcium  8.2 (L) 8.5 - 10.6 mg/dL    Total Protein 5.4 (L) 6.0 - 8.0 g/dL    Total Bilirubin 83.3 (H) 0.2 - 1.3 mg/dL    Albumin  2.2 (L) 3.5 - 5.0 g/dL    Alk Phosphatase 673 (H) 25 - 110 U/L    AST 93 (H) 7 - 40 U/L    ALT 65 (H) 7 - 56 U/L    CO2 25 21 - 30 mmol/L    Anion Gap 10 3 - 12    Glomerular Filtration Rate (GFR) 16 (L) >60 mL/min   MAGNESIUM     Collection Time: 08/29/23  2:29 AM   Result Value Ref Range    Magnesium  1.5 (L) 1.6 - 2.6 mg/dL   PHOSPHORUS    Collection Time: 08/29/23  2:29 AM   Result Value Ref Range    Phosphorus 2.6 2.0 - 4.5 mg/dL   MANUAL DIFF    Collection Time: 08/29/23  2:29 AM   Result Value Ref Range    ANISO Present     Target Present     Platelet Estimate Normal     Segmented Neutrophils 87 (H) 41 - 77 %    Lymphocytes 1 (L) 24 - 44 %    Monocytes 9 4 - 12 %    Myelocyte 2 %    Basophil 1 0 - 2 %    Absolute Neutrophil Count Manual 33.8 (H) 1.8 - 7.0 10*3/uL              Rea LOISE Hamel, APRN-NP  Pager [phone]

## 2023-08-29 NOTE — Unmapped
 Patient/family declined:  Bed/chair alarm and W/in arms reach during toileting/showering.    Patient/family educated on importance of intervention to their safety/quality of care. Patient/family continues to decline care.    Reason Why Patient/Family declined:  Pt wishes to ambulate with family, agrees to call staff if light-headed, dizzy, or weak.    Individualized safety and/or care plan implemented. If additional safety measures implemented, please list them.     Escalated to:  Unit coordinator/charge nurse.

## 2023-08-29 NOTE — Progress Notes
 CLINICAL NUTRITION                                                        Clinical Nutrition Initial Assessment    Name: Vicki Buchanan   MRN: [id number]     DOB: 06/13/1955      Age: 68 y.o.  Admission Date: 08/20/2023     LOS: 9 days     Date of Service: 08/29/2023        Recommendation:  Continue regular diet. Avoid prolonged NPO diet orders.   Encourage small, frequent meals q2-3hrs- discourage pt from skipping.   Please offer & encourage Ensure supplements daily, especially if meal intake is <50% or skipped.  Ensure Plus HP if tolerated, otherwise Ensure Clear is available.   Pt with inadequate PO intake during admission, meeting criteria for moderate non-severe malnutrition. If within GOC, consider initiation of nutrition support (EN vs TPN) to supplement PO intake if no improvement. Consult RD for recs if proceeding.     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 She underwent ERCP 08/21/23 with viabil stent placement in the ampulla, complicated by post ERCP pancreatitis. She has also had hypotension requiring significant IV fluids (she gets 1-2L MWF as an outpatient).     Visited pt today to complete nutrition assessment, visitor at bedside. Appetite/PO intake has been low during admission. Ordering 3x/day but only eating a few bites at each meal. Warm foods have not been appealing to her, prefers cold items. Initially was having issues with nausea/indigestion but meds have helped. Appetite was present PTA, would eat small frequent meals q3hrs (e.g., potatoes w/ butter, rice, grilled ham & cheese sandwich). She was drinking Education administrator at home. During admission, she has been drinking Ensure Clear x1 daily over the last few days- she ordered some to be delivered to her home after d/c as well. She tried the unit shakes which she enjoys the taste of but has noticed it causes increased air in ostomy. The creamy Ensure supplements sometimes cause indigestion. Visitor reports PO intake has also been difficult d/t frequent NPO diet orders lately. Pt reports eating 50% of oatmeal and banana + a few orange slices this AM. Denied current issues with n/v. Reports allergy to shrimp. Denied unintentional wt loss, UBW is 112#. EMR shows fairly stable wt PTA. First standing scale wt measured on 6/4 (113#). Current wt is 129#. Suspect wt gain is related to fluid (+3.8L per I/Os, edema noted).  Pt is at acute nutrition risk, meets criteria for malnutrition. Encouraged small, frequent meals. Reviewed meal ideas- soft/cold or room temperature options. Encouraged Ensure supplements, especially if meal intake is <50% or skipped (clear vs plus HP- based on tolerance). Reviewed estimated energy needs, per pt request. RD will continue to follow.     Meds- vitamin D2, mg sulfate, protonix . Labs- 36 BUN, 3.11 Cr, 16 GFR, 1.5 mg, elevated liver enzymes.     Nutrition Assessment of Patient:  Admit Weight: 51.2 kg (first ss);  ;    BMI (Calculated): 20.67;      Pertinent Allergies/Intolerances: shrimp      Oral Diet Order: Regular;NPO  at midnight;       Current Energy Intake: Inadequate           Weight Used for Calculation: 51.2 kg  Estimated Calorie Needs: 1536-1792 (30-35 kcal/kg of first ss wt)  Estimated Protein Needs: 61-67g (1.2-1.3 g/kg of first ss wt)    Malnutrition Assessment:   Malnutrition present  ICD-10 code E44: Acute illness/Moderate non-severe malnutrition  Mild loss of body fat, Mild loss of muscle mass, Energy Intake: Less than 75% of estimated energy requirement for greater than 7days                Malnutrition Interventions: Encouraged small frequent meals, reviewed meal ideas, encouraged Ensure supplements    Nutrition Focused Physical Exam:  Loss of Subcutaneous Fat: Yes; Severity: Mild; Location: Triceps  Muscle Wasting: Yes; Severity: Mild; Location: Clavicle    Physical Assessment:  Generalized Edema: Non-pitting  RLE Edema: Pitting 1+  LLE Edema: Pitting 1+  Pedal Edema: Pitting 1+     Pressure Injury: none noted     Comment: ileostomy output on 6/10 per I/Os    Nutrition Diagnosis:  Inadequate protein-energy intake  Etiology: decreased appetite  Signs & Symptoms: Pt interview, chart review      Intervention / Plan:  Monitor PO intake/tolerance, wt trends, labs, I/Os, GI sx        Nazyia Gaugh, MCN, RDN, LD  Available on Voalte

## 2023-08-29 NOTE — Progress Notes
 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  Obstructive biliary tree; slender left intrahepatic ducts; tumor ingrowth into midportion of metal stent; LHD opacified with difficulty  08/03/2023 admitted to [facility] with acute cholangitis  Started Zosyn   08/04/2023 urine culture: >100K CFU Enterococcus faecalis (S) ampicillin   08/06/2023 underwent ERCP which revealed indwelling uncovered metal biliary stent in the left hepatic duct which was exchanged with plastic stent, mild stenosis within the metal stent at the proximal main bile duct, diffuse malignant stenoses of intrahepatic ducts; presence of purulence  08/07/2023 discontinued Zosyn ; started Augmentin ; discharged home  08/16/2023 underwent ERCP which revealed a protruding indwelling metallic stent in CBD seen coming out from ampulla with a plastic stent inside the metal stent, plastic stent was removed, occlusion cholangiogram: right posterior intrahepatic duct opacified; left hepatic system not opacified and totally; right anterior intrahepatic with branches partially opacified with significant narrowing beyond indwelling stent up to the hilum of liver; palced a Habiba catheter size 8 French, 2.7 and RFA x 2; large amount of tumor ingrowth inside the indwelling stent with large amount of tissue growing at the entrance of the stent which again this area also tumor destruction applied using RFA x 2; placed a 10 French, 10 cm double-pigtail plastic stent was successfully placed into the right intrahepatic duct; good outflow of bile and contrast seen through both the stents  08/20/2023 seen in oncology clinic [facility], noted to have persistent jaundice status post ERP and stent placement, received cefepime  x 1 dose  08/20/23: Directly admitted to the hospitalist service  Blood cultures: No growth x 2 sets  Urine culture: No growth  Started Zosyn   08/21/23 ERCP: uncovered metal stent in CBD extending out of ampulla with a double-pigtail plastic stent through the lumen of the uncovered metal stent, double-pigtail plastic stent removed, stone extraction balloon passed into left intrahepatic duct, balloon sweeps with extraction of a large amount of sludge, food debris and stent debris;  occlusion cholangiogram: Narrowing within the midportion of the stent consistent with tumor/tissue ingrowth.  RHD mildly dilated without stricture.  LHD opacified with difficulty.  Left intrahepatic ducts were slender and not well-visualized.  Palced 10 mm x 8 cm VIABIL stent within the previously placed uncovered biliary stent, upper end of the VIABIL stent was below the hilar confluence, good outflow of contrast after stent deployment   08/23/2023 CT ABD/pelvis: Development of small left pleural effusion and minimal basilar   bronchiolitis. No small bowel obstruction, ascites, or free air. Redemonstration of extensive partially calcified peritoneal metastatic disease. New small subcutaneous nodule right lateral pelvis which is probably a new metastasis. Right ischial pressure ulcer.   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 with subsequent removal of ureteral stents  02/2017 diagnosed on laparoscopy  04/03/2017 underwent loop ileostomy creation  12/2017 started epacadosta and sirolimus   05/2018 discontinued epacadosta and sirolimus   05/27/2020 underwent laparoscopic HIPEC with mitomycin-C (trial at [facility])  10/07/2018 underwent second laparoscopic HIPEC  09/12/2018 underwent tumor debulking [facility])   05/2020 underwent radiation therapy  to right lung metastases  04/16/2020 underwent cytoreductive surgery with ostomy revision due to tumor invasion  09/2020 started FOLFIRI and bevacizumab    09/07/2021 MRI/MRCP: Slight apparent increase in diffuse intrahepatic and extrahepatic biliary ductal dilatation to the level of the porta hepatis where there is   extrinsic compression on the extrahepatic common duct from metastases. No   biliary filling defect to suggest choledocholithiasis or biliary mass. Grossly unchanged extensive peritoneal carcinomatosis and right abdominal wall metastases. Partially visualized indeterminate lesion in the left sacrum, which may   be a metastasis or hemangioma.   09/16/2021 underwent ERCP this month of an uncovered metal stent in the CBD  09/2021 held bevacizumab    08/04/2023 CT ABD/pelvis: Extensive metastatic disease throughout the abdomen and pelvis including peritoneal implants with mass effect on adjacent structures. Left posterior pleural implant with invasion of the chest wall and adjacent smaller nodules. Common bile duct and biliary stent in place. Moderate to intrahepatic biliary ductal dilatation. Right lower quadrant ostomy encased with implants. No gas-filled dilated loops of bowel to suggest obstruction. Mild left hydronephrosis with prominent left extrarenal pelvis. Diffuse bladder wall thickening with adjacent fat stranding, possibly secondary to acute cystitis. Correlation with urinalysis is recommended. Distended vaginal cuff with air-fluid level. The posterior wall of the urinary bladder is adjacent to the vaginal cuff without preserved fat plane of separation.   08/20/2023 CEA 2565.5  Currently FOLFIRI cycle 40     CKD, stage IV     Recurrent urinary tract infections     Pelvic abscess following tumor debulking  10/2018 abscess cultures: Serratia and Candida albicans  Underwent IR abscess drain placement  Received course of cefepime  and fluconazole      Asplenia, surgical  02/2018 Pneumovax   09/2018 PCV13 (Prevnar)   09/2018 Haemophilus influenzae type B   Meningococcal conjugate vaccine (Menveo) 09/30/2018 and 11/2018  Meningococcal serogroup B vaccine (MenB) 09/30/2018 and 10/28/2018    Recommendations:     Recent ERCPs confirmed further tumor growth with some obstruction of the existing metal stent within the CBD as well as a partially obstructed left hepatic ductal system, as evidenced on the cholangiogram.    Over the last several days, patient has not had fever but has developed progressive leukocytosis and had persistent elevation of hepatic transaminases, alkaline phosphatase and total bilirubin.  08/28/2023, underwent placement of internal/external biliary drain into the left hepatic ductal system to decompress.  However, effectively new output from this new enteral/external biliary drain.    Inadequate decompression of the biliary tree is likely responsible for our ongoing leukocytosis.  Intended to order amp sulbactam yesterday evening but did not place.  Appreciate Dr. Linus placing the order today.  Recommend internal/external biliary drain evaluation in IR tomorrow, with possible drain upsizing.  Per hepatobiliary team comments from yesterday, patient may also require internal/external biliary drain placement in the right hepatic system as well.              Interval Hx     Oral intake is poor. If she eats too much, she gets worsening nausea and vomiting. No significant abd pain. No HA. Denies new cough or SOB.   Still no meaningful output from existing int/ext biliary drain.   Earlier, she did note some small blood clots within her ostomy bag.    Overnight, no fever.  Systolic BP 115-175.  No tachycardia.  On room air.    WBC: 23 -> 38.8  Hemoglobin: 8  Platelets: 157  Creatinine: 3.15 -> 3.11  AST: 93  ALT: 65  Alkaline phosphatase: 326    No reported net output from internal/external biliary drain overnight    Patient discussed with Dr. Linus      Antimicrobial Start date End date   cefepime  6/2 6/2   Pip/tazo 6/2 6/6   Amp/sulb 6/11 active                       Estimated Creatinine Clearance: 16 mL/min (A) (based on SCr of 3.11 mg/dL (H)).      Medications   Scheduled Meds:ampicillin /sulbactam (UNASYN ) 3 g in sodium chloride  0.9% (NS) 100 mL IVPB (MB+), 3 g, Intravenous, Q12H*  ERGOcalciferoL  (vitamin D2) (DRISDOL ) capsule 50,000 Units, 50,000 Units, Oral, Q7 Days  heparin  (porcine) PF syringe 5,000 Units, 5,000 Units, Subcutaneous, Q8H  magnesium  sulfate   1 g/D5W 100 mL IVPB, 1 g, Intravenous, Q4H  pantoprazole  (PROTONIX ) injection 40 mg, 40 mg, Intravenous, BID(11-21)  sodium chloride  0.9 % (flush) syringe 10 mL, 10 mL, Intra-catheter, BID    Continuous Infusions:  PRN and Respiratory Meds:acetaminophen  Q6H PRN, alteplase  BID PRN, alum-mag hydroxide-simeth Q4H PRN **AND** [EXPIRED] lidocaine  hcl viscous ONCE, calcium  carbonate Q6H PRN, cetirizine  QDAY PRN, diphenoxylate -atropine  QID PRN, fentaNYL  citrate PF Q3H while awake PRN, hydrOXYzine  HCL Q6H PRN, loperamide  QID PRN, melatonin QHS PRN, ondansetron  Q6H PRN **OR** ondansetron  (ZOFRAN ) IV Q6H PRN, oxyCODONE  Q4H PRN, polyethylene glycol 3350  QDAY PRN, sennosides-docusate sodium  QDAY PRN, trimethobenzamide  Q6H PRN      Allergies     Allergies   Allergen Reactions    Indomethacin  SEE COMMENTS     Pain for several days after suppository post ERCP. Tolerated IV but not PR    Shrimp SEE COMMENTS     Eye irritation. Namely blood vessel issues in the eyes        Physical Examination                          Vital Signs: Last                  Vital Signs: 24 Hour Range   BP: 123/58 (06/11 1530)  Temp: 36.8 ?C (98.3 ?F) (06/11 1530)  Pulse: 65 (06/11 1530)  Respirations: 16 PER MINUTE (06/11 1530)  SpO2: 97 % (06/11 1530)  O2 Device: None (Room air) (06/11 1530) BP: (117-161)/(58-75)   Temp:  [36.6 ?C (97.8 ?F)-37.1 ?C (98.7 ?F)]   Pulse:  [65-77]   Respirations:  [15 PER MINUTE-16 PER MINUTE]   SpO2:  [95 %-98 %]   O2 Device: None (Room air)     Gen: Awake, alert, NAD at rest.  Fatigued appearing.  General icterus  HEENT: EOMI, scleral icterus present, no visible thrush  Neck: Supple  Lungs: Clear  Heart: Regular  Abd: + BS, soft, mild tenderness in upper abdomen.  Anterior internal/external biliary drain in place.  Scant output into collection system.  Ostomy intact without surrounding tenderness  Ext: 1+ lower extremity edema  Skin: Generalized icterus, no acute rash  Musculoskel: No warm or swollen joints  Psych: Oriented x 3 with normal mood and affect    Lines:    Lab Review   Hematology  Recent Labs     08/27/23  0433 08/28/23  0411 08/28/23  1627 08/29/23  0229   WBC 19.90* 23.00*  --  38.80*   HGB 8.7* 8.7* 9.0* 8.0*   HCT 25.1* 25.3* 26.2* 23.9*   PLTCT 149*  153  --  157   INR 1.1 1.1  --  1.2     Chemistry  Recent Labs     08/27/23  0433 08/28/23  0411 08/29/23  0229   NA 138 138 138   K 3.7 3.6 4.0   CL 103 105 103   CO2 24 24 25    BUN 41* 37* 36*   CR 3.28* 3.15* 3.11*   GFR 15* 16* 16*   GLU 183* 121* 149*   CA 8.2* 8.7 8.2*   PO4 1.8* 2.3 2.6   ALBUMIN  2.6* 2.5* 2.2*   ALKPHOS 331* 323* 326*   AST 79* 108* 93*   ALT 62* 73* 65*   TOTBILI 16.9* 18.4* 16.6*       Microbiology, Radiology and other Diagnostics Review   Microbiology data reviewed.    Pertinent radiology images viewed.       Garnette Samuel, MD   Contact via Voalte  Transplant & Immunocompromised Infectious Diseases

## 2023-08-30 ENCOUNTER — Inpatient Hospital Stay: Admit: 2023-08-30 | Discharge: 2023-08-30 | Payer: MEDICARE

## 2023-08-30 LAB — MANUAL DIFF
~~LOC~~ BKR BANDS - RELATIVE: 4 % (ref 0–10)
~~LOC~~ BKR LYMPHOCYTES - RELATIVE: 6 % — ABNORMAL LOW (ref 24–44)
~~LOC~~ BKR METAMYELOCYTES - RELATIVE: 2 %
~~LOC~~ BKR MONOCYTES - RELATIVE: 9 % (ref 4–12)
~~LOC~~ BKR NEUT+BANDS - ABSOLUTE: 27 10*3/uL — ABNORMAL HIGH (ref 1.8–7.0)
~~LOC~~ BKR NEUTROPHILS - RELATIVE: 79 % — ABNORMAL HIGH (ref 41–77)

## 2023-08-30 LAB — CBC AND DIFF: ~~LOC~~ BKR MPV: 9.9 fL — ABNORMAL LOW (ref 7.0–11.0)

## 2023-08-30 MED ORDER — FENTANYL CITRATE (PF) 50 MCG/ML IJ SOLN
INTRAVENOUS | 0 refills | Status: DC
Start: 2023-08-30 — End: 2023-08-30

## 2023-08-30 MED ORDER — PROPOFOL INJ 10 MG/ML IV VIAL
INTRAVENOUS | 0 refills | Status: DC
Start: 2023-08-30 — End: 2023-08-30

## 2023-08-30 MED ORDER — IOHEXOL 300 MG IODINE/ML IV SOLN
10 mL | Freq: Once | 0 refills | Status: CP
Start: 2023-08-30 — End: ?
  Administered 2023-08-30: 15:00:00 10 mL

## 2023-08-30 MED ORDER — LACTATED RINGERS IV SOLP
INTRAVENOUS | 0 refills | Status: AC
Start: 2023-08-30 — End: ?
  Administered 2023-08-30 – 2023-08-31 (×3): 1000.0000 mL via INTRAVENOUS

## 2023-08-30 MED ORDER — PROPOFOL 10 MG/ML IV EMUL 20 ML (INFUSION)(AM)(OR)
INTRAVENOUS | 0 refills | Status: DC
Start: 2023-08-30 — End: 2023-08-30
  Administered 2023-08-30: 14:00:00 80 ug/kg/min via INTRAVENOUS

## 2023-08-30 MED ORDER — SODIUM CHLORIDE 0.9 % IV SOLP (OR) 500ML
INTRAVENOUS | 0 refills | Status: DC
Start: 2023-08-30 — End: 2023-08-30

## 2023-08-30 NOTE — Anesthesia Pre-Procedure Evaluation
 Anesthesia Pre-Procedure Evaluation    Name: Vicki Buchanan      MRN: [id number]     DOB: 17-Oct-1955     Age: 68 y.o.     Sex: female   _________________________________________________________________________     Procedure Info:   Procedure Information       Date/Time: 08/30/23 0900    Scheduled providers: Atanacio Layman, MD; Hobson Luna, RN; Justo Opal, RT(R),LRT; Ruperto Coup, MD    Procedure: IR BILIARY DRAIN UPSIZE    Location: Interventional Radiology: [facility]            Physical Assessment  Vital Signs (last filed in past 24 hours):  BP: 154/74 (06/12 0845)  Temp: 36.7 ?C (98.1 ?F) (06/12 0845)  Pulse: 67 (06/12 0845)  Respirations: 21 PER MINUTE (06/12 0845)  SpO2: 94 % (06/12 0845)  O2 Device: None (Room air) (06/12 0845)  Weight: 58.7 kg (129 lb 6.4 oz) (06/11 0856)     Glucose, POC   Date/Time Value Ref Range Status   08/23/2023 2108 122 (H) 70 - 100 mg/dL Final      Patient History   Allergies   Allergen Reactions    Indomethacin SEE COMMENTS     Pain for several days after suppository post ERCP. Tolerated IV but not PR    Shrimp SEE COMMENTS     Eye irritation. Namely blood vessel issues in the eyes         Current Medications    Medication Directions   acetaminophen/lidocaine/antacid(#) (GI COCKTAIL) 1:1:1 oral suspension Take 30 mL by mouth every 4 hours as needed.   apap-maalox-lidocaine 1:3:1 oral suspension (MIXTURE COMPOUND) Take 30 mL by mouth every 4 hours as needed.  Patient taking differently: Take  by mouth every 4 hours as needed.   calcium carbonate (TUMS) 500 mg (200 mg elemental calcium) chewable tablet Chew one tablet by mouth every 6 hours as needed.   carvediloL (COREG) 6.25 mg tablet Take one tablet by mouth twice daily. Take with food.  Hold if systolic blood pressure < 110   cetirizine (ZYRTEC) 10 mg tablet Take one tablet by mouth daily.   diphenoxylate-atropine (LOMOTIL) 2.5-0.025 mg tablet Take one tablet by mouth four times daily as needed for Diarrhea. esomeprazole DR (NEXIUM) 20 mg capsule Take one capsule by mouth daily. Take on an empty stomach at least 1 hour before or 2 hours after food.   hydrOXYzine HCL (ATARAX) 25 mg tablet Take one tablet by mouth every 6 hours as needed for Itching.   loperamide (IMODIUM A-D) 2 mg capsule Take one pill by mouth four times daily as needed   magnesium oxide 400 mg magnesium tablet Take one tablet by mouth twice daily.   ondansetron (ZOFRAN ODT) 8 mg rapid dissolve tablet DISSOLVE 1 TABLET UNDER THE TONGUE EVERY 8 HOURS AS NEEDED FOR NAUSEA AND VOMITING   potassium chloride (KLOR-CON SPRINKLE) 10 mEq capsule Take one capsule by mouth twice daily. If potassium < 3.5. Take with a meal and a full glass of water.   sod chlor,sod bicarb/neti pot (SINUS WASH NETI POT SINI) Use as directed   traMADoL (ULTRAM) 50 mg tablet Take one tablet to two tablets by mouth daily as needed for Pain.       Review of Systems/Medical History      Patient summary reviewed  Pertinent labs reviewed    PONV Screening: Non-smoker and Female sex    No history of anesthetic complications  No family history of anesthetic complications      Airway         No TMJ      Pulmonary           No indications/hx of asthma      no COPD         No recent URI        No Obstructive Sleep Apnea      Lung metastases s/p radiation      Cardiovascular       Recent diagnostic studies:          echocardiogram          2020 ECHO- WNL      Exercise tolerance: <4 METS       Beta Blocker therapy: Yes      Beta blockers within 24 hours: Yes      Hypertension, well controlled          No past MI        No palpitations      No angina        GI/Hepatic/Renal             GERD, well controlled        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; jaundice - biliary drain placed 6/10, now here for upsize):        Pancreatitis (post ERCP pancreatitis)       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)         No electrolyte problems      No nausea      No vomiting (vomited yesterday)        S/p HIPEC, 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  Daily Nexium for GERD         Neuro/Psych       No seizures        No CVA      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 08/21/23 - considered to be in remission. She gets maintanence chemo once a month.):    current, chemotherapy and treated        04/2017 Port a cath placement        Physical Exam    Airway Findings      Mallampati: II      TM distance: >3 FB      Neck ROM: full      Mouth opening: good      Airway patency: adequate    Dental Findings:       Increased risk for dental injury; pt advised      Comments: Teeth are prominent    Cardiovascular Findings:       Rhythm: regular      Rate: normal    Pulmonary Findings:       Breath sounds clear to auscultation.    Abdominal Findings:         Abdomen soft  Bowel sounds normal.    Neurological Findings:       Alert and oriented x 3    Constitutional findings:       No acute distress      Comments: jaundice       Previous Airway Procedure Notes Displaying the 3 most recent records      Date Mask Difficulty Techninque used for succesful ETT placement Blade Type Blade Size View with successful placement Number of attempts    08/21/23 0 - not attempted direct laryngoscopy Macintosh 3 grade I - full view of glottis 1    08/16/23 1 - vent by mask direct laryngoscopy Macintosh 3 grade I - full view of glottis 1    07/26/23 0 - not attempted direct laryngoscopy Macintosh 3 grade I - full view of glottis 1            Patient Lines/Drains/Airways Status       Active Lines:       Name Placement date Placement time Site Days    Implantable Port-a-cath Single Lumen 04/23/17 0845 Subclavian, Left 04/23/17  0845  Subclavian, Left  2320    Ileostomy 04/03/17 0951 Lower Right Quadrant 04/03/17  0951  Lower Right Quadrant  2340    Biliary Drain Abdomen, left upper 08/28/23  0921  -- 2                  Diagnostic Tests  Hematology:   Lab Results   Component Value Date    HGB 7.5 (L) 08/30/2023    HCT 21.3 (L) 08/30/2023    PLTCT 164 08/30/2023    WBC 32.50 (H) 08/30/2023    NEUT 60.1 08/20/2023    ANC 27.0 (H) 08/30/2023    LYMPH 6 (L) 08/30/2023    ALC 5.40 (H) 08/20/2023    MONA 10.8 08/20/2023    AMC 2.20 (H) 08/20/2023    EOSA 0.8 08/20/2023    ABC 0.30 (H) 08/20/2023    BASOPHILS 1 08/29/2023    MCV 94.4 08/30/2023    MCH 33.1 08/30/2023    MCHC 35.1 08/30/2023    MPV 9.9 08/30/2023    RDW 21.3 (H) 08/30/2023         General Chemistry:   Lab Results   Component Value Date    NA 135 (L) 08/30/2023    K 3.9 08/30/2023    CL 102 08/30/2023    CO2 24 08/30/2023    GAP 9 08/30/2023    BUN 34 (H) 08/30/2023    CR 3.28 (H) 08/30/2023    GLU 146 (H) 08/30/2023    CA 7.9 (L) 08/30/2023    ALBUMIN 2.2 (L) 08/30/2023    LACTIC 1.1 08/20/2023    MG 1.8 08/30/2023    TOTBILI 16.4 (H) 08/30/2023    PO4 2.8 08/30/2023      Coagulation:   Lab Results   Component Value Date    PT 13.5 08/30/2023    PTT 33.8 08/21/2023    INR 1.2 08/30/2023       PAC Plan    Anesthesia Plan    ASA score: 3   Plan: MAC  NPO status: acceptable      Informed Consent  Anesthetic plan and risks discussed with patient.  Use of blood products discussed with patient  Blood Consent: consented      Plan discussed with: anesthesiologist, CRNA and resident.      Alerts

## 2023-08-30 NOTE — Anesthesia Post-Procedure Evaluation
 Post-Anesthesia Evaluation    Name: Vicki Buchanan      MRN: [id number]     DOB: Jan 25, 1956     Age: 68 y.o.     Sex: female   __________________________________________________________________________     Procedure Information       Anesthesia Start Date/Time: 08/30/23 0920    Scheduled providers: Atanacio Layman, MD; Hobson Luna, RN; Justo Opal, RT(R),LRT; Ruperto Coup, MD    Procedure: IR BILIARY DRAIN UPSIZE    Location: Interventional Radiology: [facility]            Post-Anesthesia Vitals  BP: 130/67 (06/12 1000)  Temp: 36.7 ?C (98 ?F) (06/12 0950)  Pulse: 70 (06/12 1000)  Respirations: 14 PER MINUTE (06/12 1000)  SpO2: 95 % (06/12 1000)  O2 Device: None (Room air) (06/12 1000)   Vitals Value Taken Time   BP 130/67 08/30/23 10:00   Temp     Pulse 70 08/30/23 10:00   Respirations 14 PER MINUTE 08/30/23 10:00   SpO2 95 % 08/30/23 10:00   O2 Device None (Room air) 08/30/23 10:00   ABP     ART BP           Post Anesthesia Evaluation Note    Evaluation location: Pre/Post  Patient participation: recovered; patient participated in evaluation  Level of consciousness: alert    Pain score: 2  Pain management: adequate    Hydration: normovolemia  Temperature: 36.0?C - 38.4?C  Airway patency: adequate    Perioperative Events      Postoperative Status  Cardiovascular status: hemodynamically stable  Respiratory status: spontaneous ventilation  Follow-up needed: none        Perioperative Events  There were no known complications for this encounter.

## 2023-08-30 NOTE — Progress Notes
 General Progress Note    Name: Vicki Buchanan        MRN: [id number]          DOB: [birth year]            Age: 68 y.o.  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 She underwent ERCP 08/21/23 with viabil stent placement in the ampulla, complicated by post ERCP pancreatitis.  She has also had hypotension requiring significant IV fluids (she gets 1-2L MWF as an outpatient).      Mucinous adenocarcinoma of the appendix  hyperbilirubinemia  malignant biliary obstruction  ascending cholangitis   post ERCP pancreatitis   - admitted with rising bili, leukocytosis, anemia  - oncology consulted, appreciate assistance  - recently hospitalized at [facility] with pus noted to be coming out of her bile ducts during ERCP 08/06/23  - biliary consulted, s/p ERCP 08/21/23: Uncovered metal stent coming out of the ampulla with double-pigtail plastic stent going through the lumen of the metal stent.  Food and stent debris seen in stent lumen.  Occlusion cholangiogram performed that showed narrowing in the midportion of the stent consistent with tumor/tissue ingrowth.  Right hepatic duct mildly dilated without any obvious stricture.  Left intrahepatic ducts normal-appearing.  A 10 mm x 8 cm viabil stent was deployed within the previously placed uncovered biliary stent with good outflow of contrast.   - CT A/P 6/5:   Development of small left pleural effusion and minimal basilar bronchiolitis.  No small bowel obstruction, ascites, or free air. Redemonstration of extensive partially calcified peritoneal metastatic disease.   New small subcutaneous nodule right lateral pelvis which is probably a new metastasis.   Right ischial pressure ulcer.   - abdominal pain, elevated lipase post ERCP: post-procedure pancreatitis  - continue aggressive IVF  - monitor LFTs, bilirubin--bili not significantly lower post ERCP, may need further drainage via IR  - continue analgesics  - ID consulted  - continue zosyn  (6/2--6/6)  - blood cultures 6/2 with NG   > PTC ordered in IR to access smaller peripheral intrahepatic ducts and potentially help drain the left system.   -Status post placement of 8 French internal/external biliary drain via a dilated peripheral left duct.  Dilated left biliary tree.  - Biliary drain should be placed to gravity drainage until bilirubin normalizes and flushed with 10 mL normal saline twice a day.  Patient follow-up in 3 months for routine preventive maintenance of biliary catheters.  -Bedside nursing had trouble with flushing the drain on 6/10. Will CTM  - T. bili elevated at 16.6, better from 18.4 in 6/10, AST better at 93 from 108, ALT better at 65 from 73, alk phos stable at 326 from 323    Leukocytosis  > discussed with ID who were consulted, but signed off. They requested a new consult on 6/10.  > re-consult ID per recommendation of Dr. Mable.  - WBC count worse today at 38.8 from 23, reached out to Dr. Jakie from ID on 6/11-appreciate recommendations,  recommendations consulted IR for biliary drain check and upsize on 6/11-pending.  Patient has been n.p.o. and did not eat lunch today in anticipation of procedure  - IR drain upsized on 6/12  - Started on Unasyn  per ID recommendations    Anemia  > 1 unit prbcs ordered on 6/7.  - Hemoglobin today 8 from 8.7-9 on 6/10, denies any bleeding, monitor    Hypotension--improved   - Repeated rapid responses 6/5-6/6 for hypotension, received 5L IVF  - Echo 08/22/23: The left ventricular size is normal. The left ventricular wall thickness is normal. Normal geometry.  The left ventricular systolic function is normal. The ejection fraction by Simpson's biplane method is 61%. There are no segmental wall motion abnormalities. Normal left ventricular diastolic function.  The right ventricular size is normal. The right ventricular systolic function is normal.  Nonspecific thickening of the mitral valve with mild to moderate regurgitation, no stenosis.  IVC was not visualized on the study.  No pericardial effusion.  - hold home hydralazine , carvedilol   - stop IVF  - patient gets 1-2L IV fluids MWF as an outpatient, will need to watch her ins/outs   - Ordered 1 L of NS on 6/11    AKI on CKD4  chronic mild L hydronephrosis   - follows with Dr. Marlo as outpatient  - Creatinine stable today 3.11 from 3.15, BUN stable at 36 from 37  - Ordered 1 L of NS on 6/11    Hypomagnesemia:  - Replace magnesium  IV on 6/11, magnesium  1.5 today, recheck in a.m.    GERD  - continue PPI  - add PRN GI cocktail     FEN:   Fluids: stopped LR, ordered 1 L of NS on 6/11  Monitor and replace electrolytes as needed  Diet: cardiac     DVT PPX: heparin  SQ     CODE STATUS: FULL CODE   NOK: husband, updated at bedside today     Disposition:  continue admission     Today, 08/30/2023, I personally reviewed vitals, labs, imaging, telemetry, nursing notes, and consultant notes.  I also discussed this patient's care with the patient, patient's family, the Child psychotherapist, the case manager, the pharmacist, and the consultant from nephrology.  Furthermore, I ordered labs as part of continued management of Vicki Buchanan.  Discussed plan of care with patient and her husband and RN at bedside.  Discussed patient with IR NP today.  Discussed patient with NCM, social worker and pharmacist today      _______________________________________________________________________    Subjective   Vicki Buchanan is a 68 y.o. female.  Patient is in good spirits today. Reviewed labs with patient and family. Decided to initiate continuous fluids. Encouraged po intake.     Medications  Scheduled Meds:ampicillin /sulbactam (UNASYN ) 3 g in sodium chloride  0.9% (NS) 100 mL IVPB (MB+), 3 g, Intravenous, Q12H*  ERGOcalciferoL  (vitamin D2) (DRISDOL ) capsule 50,000 Units, 50,000 Units, Oral, Q7 Days  heparin  (porcine) PF syringe 5,000 Units, 5,000 Units, Subcutaneous, Q8H  pantoprazole  (PROTONIX ) injection 40 mg, 40 mg, Intravenous, API(88-78)  sodium chloride  0.9 % (flush) syringe 10 mL, 10 mL, Intra-catheter, BID    Continuous Infusions:   lactated ringers  infusion 125 mL/hr at 08/30/23 1343       PRN and Respiratory Meds:acetaminophen  Q6H PRN, alteplase  BID PRN, alum-mag hydroxide-simeth Q4H PRN **AND** [EXPIRED] lidocaine  hcl viscous ONCE, calcium  carbonate Q6H PRN, cetirizine  QDAY PRN, diphenoxylate -atropine  QID PRN, fentaNYL  citrate PF Q3H while awake PRN, hydrOXYzine  HCL Q6H  PRN, loperamide  QID PRN, melatonin QHS PRN, ondansetron  Q6H PRN **OR** ondansetron  (ZOFRAN ) IV Q6H PRN, oxyCODONE  Q4H PRN, polyethylene glycol 3350  QDAY PRN, sennosides-docusate sodium  QDAY PRN, trimethobenzamide  Q6H PRN      Objective                        Vital Signs: Last Filed                 Vital Signs: 24 Hour Range   BP: 139/65 (06/12 1045)  Temp: 36.7 ?C (98 ?F) (06/12 0950)  Pulse: 65 (06/12 1045)  Respirations: 18 PER MINUTE (06/12 1045)  SpO2: 94 % (06/12 1045)  O2 Device: None (Room air) (06/12 1015) BP: (117-154)/(58-74)   Temp:  [36.7 ?C (98 ?F)-36.9 ?C (98.5 ?F)]   Pulse:  [65-72]   Respirations:  [14 PER MINUTE-21 PER MINUTE]   SpO2:  [94 %-98 %]   O2 Device: None (Room air)   Intensity Pain Scale (Self Report): 6 (08/30/23 1100) Vitals:    08/22/23 1503 08/24/23 0557 08/29/23 0856   Weight: 51.3 kg (113 lb) 58.7 kg (129 lb 6.4 oz) 58.7 kg (129 lb 6.4 oz)         Intake/Output Summary:  (Last 24 hours)    Intake/Output Summary (Last 24 hours) at 08/30/2023 1406  Last data filed at 08/30/2023 1100  Gross per 24 hour   Intake 600 ml   Output 310 ml   Net 290 ml     Stool Occurrence: 1        Physical Exam  General: alert, oriented, no acute distress, appears stated age  Head: normocephalic, atraumatic, no obvious abnormality  Eyes: conjunctivae icteric  Nose: nares normal  Throat: normal oropharynx.  Lips and tongue normal.   Lungs: clear to auscultation bilaterally  Heart: normal rate, regular rhythm.  S1 and S2 normal.  No murmur.   Abdomen: soft, non tender, non distended, normoactive bowel sounds.    Extremities: 1+ lower extremity edema  Peripheral pulses: 2+ radial and dorsalis pedis bilaterally   Skin: dry, jaundiced  Psych: mood good       Lab Review  24-hour labs:    Results for orders placed or performed during the hospital encounter of 08/20/23 (from the past 24 hours)   PROTIME INR (PT)    Collection Time: 08/30/23  2:56 AM   Result Value Ref Range    Protime 13.5 9.9 - 14.2 Seconds    INR 1.2 0.9 - 1.2   CBC AND DIFF    Collection Time: 08/30/23  2:56 AM   Result Value Ref Range    White Blood Cells 32.50 (H) 4.50 - 11.00 10*3/uL    Red Blood Cells 2.26 (L) 4.00 - 5.00 10*6/uL    Hemoglobin 7.5 (L) 12.0 - 15.0 g/dL    Hematocrit 78.6 (L) 36.0 - 45.0 %    MCV 94.4 80.0 - 100.0 fL    MCH 33.1 26.0 - 34.0 pg    MCHC 35.1 32.0 - 36.0 g/dL    RDW 78.6 (H) 88.9 - 15.0 %    Platelet Count 164 150 - 400 10*3/uL    MPV 9.9 7.0 - 11.0 fL   COMPREHENSIVE METABOLIC PANEL    Collection Time: 08/30/23  2:56 AM   Result Value Ref Range    Sodium 135 (L) 137 - 147 mmol/L    Potassium 3.9 3.5 - 5.1 mmol/L    Chloride 102 98 - 110  mmol/L    Glucose 146 (H) 70 - 100 mg/dL    Blood Urea Nitrogen 34 (H) 7 - 25 mg/dL    Creatinine 6.71 (H) 0.40 - 1.00 mg/dL    Calcium  7.9 (L) 8.5 - 10.6 mg/dL    Total Protein 5.2 (L) 6.0 - 8.0 g/dL    Total Bilirubin 83.5 (H) 0.2 - 1.3 mg/dL    Albumin  2.2 (L) 3.5 - 5.0 g/dL    Alk Phosphatase 707 (H) 25 - 110 U/L    AST 75 (H) 7 - 40 U/L    ALT 60 (H) 7 - 56 U/L    CO2 24 21 - 30 mmol/L    Anion Gap 9 3 - 12    Glomerular Filtration Rate (GFR) 15 (L) >60 mL/min   MAGNESIUM     Collection Time: 08/30/23  2:56 AM   Result Value Ref Range    Magnesium  1.8 1.6 - 2.6 mg/dL   PHOSPHORUS    Collection Time: 08/30/23  2:56 AM   Result Value Ref Range    Phosphorus 2.8 2.0 - 4.5 mg/dL   MANUAL DIFF    Collection Time: 08/30/23  2:56 AM   Result Value Ref Range    ANISO Present     Target Present     Platelet Estimate Normal     Segmented Neutrophils 79 (H) 41 - 77 %    Lymphocytes 6 (L) 24 - 44 %    Monocytes 9 4 - 12 %    Metamyelocyte 2 %    Bands 4 0 - 10 %    Absolute Neutrophil Count Manual 27.0 (H) 1.8 - 7.0 10*3/uL   CULTURE-WOUND/TISSUE/FLUID(AEROBIC ONLY)W/SENSITIVITY    Collection Time: 08/30/23  9:38 AM    Specimen: Bile; Fluid   Result Value Ref Range    Gram Stain Moderate Neutrophils     Gram Stain Many RBCs Seen     Gram Stain Few Budding yeast     Gram Stain Few Gram Positive Cocci in Singles and/or pairs        Point of Care Testing  (Last 24 hours)  Glucose: (!) 146 (08/30/23 0256)    Radiology and other Diagnostics Review:    tele: sinus-sinus brady      Willo LELON Phenes, MD

## 2023-08-30 NOTE — Progress Notes
 PHYSICAL THERAPY  NOTE      Name: Vicki Buchanan   MRN: [id number]     DOB: 10-26-1955      Age: 68 y.o.  Admission Date: 08/20/2023     LOS: 10 days     Date of Service: 08/30/2023    Attempt #1: [phone]): Patient off unit in IR for biliary drain upsize. PT will check back.     Attempt #2 ([phone]): Attempted to work with patient today, patient was supine and eating lunch. Politely declined therapy today due to morning procedure and reporting feeling tired. Requested PT come back tomorrow. Will follow up as indicated.         Therapist: Ai-Nhi Vivian Jourdon Zimmerle, SPT  Date: 08/30/2023

## 2023-08-30 NOTE — Progress Notes
 Anesthesia staff present to monitor patient airway, vital signs, and medications. See anesthesia docflow. This RN will assist as needed.

## 2023-08-30 NOTE — Unmapped
 Patient/family declined:  Bed/chair alarm, W/in arms reach during toileting/showering, and Ambulation.    Patient/family educated on importance of intervention to their safety/quality of care. Patient/family continues to decline care.    Reason Why Patient/Family declined:  Patient wishes to ambulate with family at bedside. Pt agrees to call out for assistance if she feels weak/dizzy.    Individualized safety and/or care plan implemented. If additional safety measures implemented, please list them.     Escalated to:  Unit coordinator/charge nurse.

## 2023-08-30 NOTE — Unmapped
 Patient/family declined:  Bed/chair alarm, W/in arms reach during toileting/showering, and Ambulation.    Patient/family educated on importance of intervention to their safety/quality of care. Patient/family continues to decline care.    Reason Why Patient/Family declined:  Pt wants to be able to ambulate without staff when family is here and in the room but will ask for help if she feels weak.    Individualized safety and/or care plan implemented. If additional safety measures implemented, please list them.     Escalated to:  Unit coordinator/charge nurse.

## 2023-08-30 NOTE — Progress Notes
 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  Obstructive biliary tree; slender left intrahepatic ducts; tumor ingrowth into midportion of metal stent; LHD opacified with difficulty  08/03/2023 admitted to [facility] with acute cholangitis  Started Zosyn   08/04/2023 urine culture: >100K CFU Enterococcus faecalis (S) ampicillin   08/06/2023 underwent ERCP which revealed indwelling uncovered metal biliary stent in the left hepatic duct which was exchanged with plastic stent, mild stenosis within the metal stent at the proximal main bile duct, diffuse malignant stenoses of intrahepatic ducts; presence of purulence  08/07/2023 discontinued Zosyn ; started Augmentin ; discharged home  08/16/2023 underwent ERCP which revealed a protruding indwelling metallic stent in CBD seen coming out from ampulla with a plastic stent inside the metal stent, plastic stent was removed, occlusion cholangiogram: right posterior intrahepatic duct opacified; left hepatic system not opacified and totally; right anterior intrahepatic with branches partially opacified with significant narrowing beyond indwelling stent up to the hilum of liver; palced a Habiba catheter size 8 French, 2.7 and RFA x 2; large amount of tumor ingrowth inside the indwelling stent with large amount of tissue growing at the entrance of the stent which again this area also tumor destruction applied using RFA x 2; placed a 10 French, 10 cm double-pigtail plastic stent was successfully placed into the right intrahepatic duct; good outflow of bile and contrast seen through both the stents  08/20/2023 seen in oncology clinic [facility], noted to have persistent jaundice status post ERP and stent placement, received cefepime  x 1 dose  08/20/23: Directly admitted to the hospitalist service  Blood cultures: No growth x 2 sets  Urine culture: No growth  Started Zosyn   08/21/23 ERCP: uncovered metal stent in CBD extending out of ampulla with a double-pigtail plastic stent through the lumen of the uncovered metal stent, double-pigtail plastic stent removed, stone extraction balloon passed into left intrahepatic duct, balloon sweeps with extraction of a large amount of sludge, food debris and stent debris;  occlusion cholangiogram: Narrowing within the midportion of the stent consistent with tumor/tissue ingrowth.  RHD mildly dilated without stricture.  LHD opacified with difficulty.  Left intrahepatic ducts were slender and not well-visualized.  Palced 10 mm x 8 cm VIABIL stent within the previously placed uncovered biliary stent, upper end of the VIABIL stent was below the hilar confluence, good outflow of contrast after stent deployment   08/23/2023 CT ABD/pelvis: Development of small left pleural effusion and minimal basilar   bronchiolitis. No small bowel obstruction, ascites, or free air. Redemonstration of extensive partially calcified peritoneal metastatic disease. New small subcutaneous nodule right lateral pelvis which is probably a new metastasis. Right ischial pressure ulcer.   08/28/2023 IR: Placement of internal/external biliary drain (8 [place]) into the left ductal system  No meaningful drain output; progressive leukocytosis  08/30/2023 IR: Upsizing of internal/external biliary drain (8 Fr -> 12 Fr) within the left ductal system.  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 with subsequent removal of ureteral stents  02/2017 diagnosed on laparoscopy  04/03/2017 underwent loop ileostomy creation  12/2017 started epacadosta and sirolimus   05/2018 discontinued epacadosta and  sirolimus   05/27/2020 underwent laparoscopic HIPEC with mitomycin-C (trial at [facility])  10/07/2018 underwent second laparoscopic HIPEC  09/12/2018 underwent tumor debulking [facility])   05/2020 underwent radiation therapy to right lung metastases  04/16/2020 underwent cytoreductive surgery with ostomy revision due to tumor invasion  09/2020 started FOLFIRI and bevacizumab    09/07/2021 MRI/MRCP: Slight apparent increase in diffuse intrahepatic and extrahepatic biliary ductal dilatation to the level of the porta hepatis where there is   extrinsic compression on the extrahepatic common duct from metastases. No   biliary filling defect to suggest choledocholithiasis or biliary mass. Grossly unchanged extensive peritoneal carcinomatosis and right abdominal wall metastases. Partially visualized indeterminate lesion in the left sacrum, which may   be a metastasis or hemangioma.   09/16/2021 underwent ERCP this month of an uncovered metal stent in the CBD  09/2021 held bevacizumab    08/04/2023 CT ABD/pelvis: Extensive metastatic disease throughout the abdomen and pelvis including peritoneal implants with mass effect on adjacent structures. Left posterior pleural implant with invasion of the chest wall and adjacent smaller nodules. Common bile duct and biliary stent in place. Moderate to intrahepatic biliary ductal dilatation. Right lower quadrant ostomy encased with implants. No gas-filled dilated loops of bowel to suggest obstruction. Mild left hydronephrosis with prominent left extrarenal pelvis. Diffuse bladder wall thickening with adjacent fat stranding, possibly secondary to acute cystitis. Correlation with urinalysis is recommended. Distended vaginal cuff with air-fluid level. The posterior wall of the urinary bladder is adjacent to the vaginal cuff without preserved fat plane of separation.   08/20/2023 CEA 2565.5  Currently FOLFIRI cycle 40     CKD, stage IV     Recurrent urinary tract infections     Pelvic abscess following tumor debulking  10/2018 abscess cultures: Serratia and Candida albicans  Underwent IR abscess drain placement  Received course of cefepime  and fluconazole      Asplenia, surgical  02/2018 Pneumovax   09/2018 PCV13 (Prevnar)   09/2018 Haemophilus influenzae type B   Meningococcal conjugate vaccine (Menveo) 09/30/2018 and 11/2018  Meningococcal serogroup B vaccine (MenB) 09/30/2018 and 10/28/2018    Recommendations:     Recent ERCPs confirmed further tumor growth with some obstruction of the existing metal stent within the CBD as well as a partially obstructed left hepatic ductal system, as evidenced on the cholangiogram.    Over the last several days, patient has not had fever but has developed progressive leukocytosis and had persistent elevation of hepatic transaminases, alkaline phosphatase and total bilirubin.  08/28/2023, underwent placement of internal/external biliary drain into the left hepatic ductal system to decompress.  However, effectively new output from this new enteral/external biliary drain.    Inadequate decompression of the biliary tree is likely responsible for our ongoing leukocytosis.    Please continue amp sulbactam  Will follow-up outputs from newly upsized internal/external biliary drain.              Interval Hx     Today, seen immediately postprocedure.  She underwent upsizing of left internal/external biliary drain.  No output in collection system immediately post procedure.    She has some tenderness at the site.  Denies nausea or vomiting.  She was able to eat a small amount of food yesterday evening.  Denies new shortness of breath or cough.  No headaches.  No blood in ostomy bag seen this morning    Afebrile.  Systolic BP P1 115-155.  No tachycardia.      WBC: 23 -> 38.8 ->  32.5  Hemoglobin: 8 -> 7.5  Platelets: 164  Creatinine: 3.15 -> 3.11 -> 3.28  AST: 93 -> 75  ALT: 65 -> 60  Alkaline phosphatase: 326 -> 292  T. bili: 16.4          Antimicrobial Start date End date   cefepime  6/2 6/2   Pip/tazo 6/2 6/6   Amp/sulb 6/11 active                       Estimated Creatinine Clearance: 15.2 mL/min (A) (based on SCr of 3.28 mg/dL (H)).      Medications   Scheduled Meds:ampicillin /sulbactam (UNASYN ) 3 g in sodium chloride  0.9% (NS) 100 mL IVPB (MB+), 3 g, Intravenous, Q12H*  ERGOcalciferoL  (vitamin D2) (DRISDOL ) capsule 50,000 Units, 50,000 Units, Oral, Q7 Days  heparin  (porcine) PF syringe 5,000 Units, 5,000 Units, Subcutaneous, Q8H  pantoprazole  (PROTONIX ) injection 40 mg, 40 mg, Intravenous, API(88-78)  sodium chloride  0.9 % (flush) syringe 10 mL, 10 mL, Intra-catheter, BID    Continuous Infusions:   lactated ringers  infusion 125 mL/hr at 08/30/23 1343     PRN and Respiratory Meds:acetaminophen  Q6H PRN, alteplase  BID PRN, alum-mag hydroxide-simeth Q4H PRN **AND** [EXPIRED] lidocaine  hcl viscous ONCE, calcium  carbonate Q6H PRN, cetirizine  QDAY PRN, diphenoxylate -atropine  QID PRN, fentaNYL  citrate PF Q3H while awake PRN, hydrOXYzine  HCL Q6H PRN, loperamide  QID PRN, melatonin QHS PRN, ondansetron  Q6H PRN **OR** ondansetron  (ZOFRAN ) IV Q6H PRN, oxyCODONE  Q4H PRN, polyethylene glycol 3350  QDAY PRN, sennosides-docusate sodium  QDAY PRN, trimethobenzamide  Q6H PRN      Allergies     Allergies   Allergen Reactions    Indomethacin  SEE COMMENTS     Pain for several days after suppository post ERCP. Tolerated IV but not PR    Shrimp SEE COMMENTS     Eye irritation. Namely blood vessel issues in the eyes        Physical Examination                          Vital Signs: Last                  Vital Signs: 24 Hour Range   BP: 139/65 (06/12 1045)  Temp: 36.7 ?C (98 ?F) (06/12 0950)  Pulse: 65 (06/12 1045)  Respirations: 18 PER MINUTE (06/12 1045)  SpO2: 94 % (06/12 1045)  O2 Device: None (Room air) (06/12 1015) BP: (117-154)/(58-74)   Temp:  [36.7 ?C (98 ?F)-36.9 ?C (98.5 ?F)]   Pulse:  [65-72]   Respirations:  [14 PER MINUTE-21 PER MINUTE]   SpO2: [94 %-98 %]   O2 Device: None (Room air)     Gen: Awake, alert, NAD at rest.  Fatigued appearing.  General icterus  HEENT: EOMI, scleral icterus present, no visible thrush  Neck: Supple  Lungs: Clear  Heart: Regular  Abd: + BS, soft, mild tenderness in upper abdomen.  Anterior internal/external biliary drain in place.  No significant output in collection system thus far.  Ostomy intact without surrounding tenderness  Ext: 1+ lower extremity edema  Skin: Generalized icterus, no acute rash  Musculoskel: No warm or swollen joints  Psych: Oriented x 3 with normal mood and affect    Lines:    Lab Review   Hematology  Recent Labs     08/28/23  0411 08/28/23  1627 08/29/23  0229 08/30/23  0256   WBC 23.00*  --  38.80* 32.50*   HGB 8.7* 9.0* 8.0*  7.5*   HCT 25.3* 26.2* 23.9* 21.3*   PLTCT 153  --  157 164   INR 1.1  --  1.2 1.2     Chemistry  Recent Labs     08/28/23  0411 08/29/23  0229 08/30/23  0256   NA 138 138 135*   K 3.6 4.0 3.9   CL 105 103 102   CO2 24 25 24    BUN 37* 36* 34*   CR 3.15* 3.11* 3.28*   GFR 16* 16* 15*   GLU 121* 149* 146*   CA 8.7 8.2* 7.9*   PO4 2.3 2.6 2.8   ALBUMIN  2.5* 2.2* 2.2*   ALKPHOS 323* 326* 292*   AST 108* 93* 75*   ALT 73* 65* 60*   TOTBILI 18.4* 16.6* 16.4*       Microbiology, Radiology and other Diagnostics Review   Microbiology data reviewed.    Pertinent radiology images viewed.       Garnette Samuel, MD   Contact via Voalte  Transplant & Immunocompromised Infectious Diseases

## 2023-08-30 NOTE — Unmapped
 Immediate Post Procedure Note    Date:  08/30/2023                                         Performing Provider:  Chauncey ONEIDA Needles, MD    Consent:  Consent obtained from patient.  Time out performed: Consent obtained, correct patient verified, correct procedure verified, correct site verified, patient marked as necessary.  Pre/Post Procedure Diagnosis:  Biliary obstruction  Indications:  Above      Procedure(s):  Biliary tube upsize  Findings:  Successful upsize to 61fr left biliary tube. Debris filled left ducts     Estimated Blood Loss:  None/Negligible  Specimen(s) Removed/Disposition:  None  Complications: None  Patient Tolerated Procedure: Well  Post-Procedure Condition:  stable    Chauncey ONEIDA Needles, MD  Pager

## 2023-08-31 ENCOUNTER — Encounter: Admit: 2023-08-31 | Discharge: 2023-08-31 | Payer: MEDICARE

## 2023-08-31 LAB — MANUAL DIFF
~~LOC~~ BKR LYMPHOCYTES - RELATIVE: 3 % — ABNORMAL LOW (ref 24–44)
~~LOC~~ BKR METAMYELOCYTES - RELATIVE: 1 %
~~LOC~~ BKR MONOCYTES - RELATIVE: 9 % (ref 4–12)
~~LOC~~ BKR MYELOCYTES - RELATIVE: 1 %
~~LOC~~ BKR NEUT+BANDS - ABSOLUTE: 20 10*3/uL — ABNORMAL HIGH (ref 1.8–7.0)
~~LOC~~ BKR NEUTROPHILS - RELATIVE: 86 % — ABNORMAL HIGH (ref 41–77)

## 2023-08-31 LAB — COMPREHENSIVE METABOLIC PANEL: ~~LOC~~ BKR ALK PHOSPHATASE: 287 U/L — ABNORMAL HIGH (ref 25–110)

## 2023-08-31 NOTE — Progress Notes
 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 without evidence of ascending cholangitis  Obstructive biliary tree; slender left intrahepatic ducts; tumor ingrowth into midportion of metal stent; LHD opacified with difficulty  08/03/2023 admitted to [facility] with acute cholangitis  Started Zosyn   08/04/2023 urine culture: >100K CFU Enterococcus faecalis (S) ampicillin   08/06/2023 underwent ERCP which revealed indwelling uncovered metal biliary stent in the left hepatic duct which was exchanged with plastic stent, mild stenosis within the metal stent at the proximal main bile duct, diffuse malignant stenoses of intrahepatic ducts; presence of purulence  08/07/2023 discontinued Zosyn ; started Augmentin ; discharged home  08/16/2023 underwent ERCP which revealed a protruding indwelling metallic stent in CBD seen coming out from ampulla with a plastic stent inside the metal stent, plastic stent was removed, occlusion cholangiogram: right posterior intrahepatic duct opacified; left hepatic system not opacified and totally; right anterior intrahepatic with branches partially opacified with significant narrowing beyond indwelling stent up to the hilum of liver; palced a Habiba catheter size 8 French, 2.7 and RFA x 2; large amount of tumor ingrowth inside the indwelling stent with large amount of tissue growing at the entrance of the stent which again this area also tumor destruction applied using RFA x 2; placed a 10 French, 10 cm double-pigtail plastic stent was successfully placed into the right intrahepatic duct; good outflow of bile and contrast seen through both the stents  08/20/2023 seen in oncology clinic [facility], noted to have persistent jaundice status post ERP and stent placement, received cefepime  x 1 dose  08/20/23: Directly admitted to the hospitalist service  Blood cultures: No growth x 2 sets  Urine culture: No growth  Started Zosyn   08/21/23 ERCP: uncovered metal stent in CBD extending out of ampulla with a double-pigtail plastic stent through the lumen of the uncovered metal stent, double-pigtail plastic stent removed, stone extraction balloon passed into left intrahepatic duct, balloon sweeps with extraction of a large amount of sludge, food debris and stent debris;  occlusion cholangiogram: Narrowing within the midportion of the stent consistent with tumor/tissue ingrowth.  RHD mildly dilated without stricture.  LHD opacified with difficulty.  Left intrahepatic ducts were slender and not well-visualized.  Palced 10 mm x 8 cm VIABIL stent within the previously placed uncovered biliary stent, upper end of the VIABIL stent was below the hilar confluence, good outflow of contrast after stent deployment   08/23/2023 CT ABD/pelvis: Development of small left pleural effusion and minimal basilar   bronchiolitis. No small bowel obstruction, ascites, or free air. Redemonstration of extensive partially calcified peritoneal metastatic disease. New small subcutaneous nodule right lateral pelvis which is probably a new metastasis. Right ischial pressure ulcer.   08/28/2023 IR: Placement of internal/external biliary drain (8 [place]) into the left ductal system  No meaningful drain output; progressive leukocytosis  08/30/2023 IR: Upsizing of internal/external biliary drain (8 Fr -> 12 Fr) within the left ductal system.  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 with subsequent removal of ureteral stents  02/2017 diagnosed on laparoscopy  04/03/2017 underwent loop ileostomy creation  12/2017 started epacadosta and sirolimus   05/2018 discontinued epacadosta and  sirolimus   05/27/2020 underwent laparoscopic HIPEC with mitomycin-C (trial at [facility])  10/07/2018 underwent second laparoscopic HIPEC  09/12/2018 underwent tumor debulking [facility]   05/2020 underwent radiation therapy to right lung metastases  04/16/2020 underwent cytoreductive surgery with ostomy revision due to tumor invasion  09/2020 started FOLFIRI and bevacizumab    09/07/2021 MRI/MRCP: Slight apparent increase in diffuse intrahepatic and extrahepatic biliary ductal dilatation to the level of the porta hepatis where there is   extrinsic compression on the extrahepatic common duct from metastases. No   biliary filling defect to suggest choledocholithiasis or biliary mass. Grossly unchanged extensive peritoneal carcinomatosis and right abdominal wall metastases. Partially visualized indeterminate lesion in the left sacrum, which may   be a metastasis or hemangioma.   09/16/2021 underwent ERCP this month of an uncovered metal stent in the CBD  09/2021 held bevacizumab    08/04/2023 CT ABD/pelvis: Extensive metastatic disease throughout the abdomen and pelvis including peritoneal implants with mass effect on adjacent structures. Left posterior pleural implant with invasion of the chest wall and adjacent smaller nodules. Common bile duct and biliary stent in place. Moderate to intrahepatic biliary ductal dilatation. Right lower quadrant ostomy encased with implants. No gas-filled dilated loops of bowel to suggest obstruction. Mild left hydronephrosis with prominent left extrarenal pelvis. Diffuse bladder wall thickening with adjacent fat stranding, possibly secondary to acute cystitis. Correlation with urinalysis is recommended. Distended vaginal cuff with air-fluid level. The posterior wall of the urinary bladder is adjacent to the vaginal cuff without preserved fat plane of separation.   08/20/2023 CEA 2565.5  Currently FOLFIRI cycle 40     CKD, stage IV     Recurrent urinary tract infections     Pelvic abscess following tumor debulking  10/2018 abscess cultures: Serratia and Candida albicans  Underwent IR abscess drain placement  Received course of cefepime  and fluconazole      Asplenia, surgical  02/2018 Pneumovax   09/2018 PCV13 (Prevnar)   09/2018 Haemophilus influenzae type B   Meningococcal conjugate vaccine (Menveo) 09/30/2018 and 11/2018  Meningococcal serogroup B vaccine (MenB) 09/30/2018 and 10/28/2018    Recommendations:     Recent ERCPs confirmed further tumor growth with some obstruction of the existing metal stent within the CBD as well as a partially obstructed left hepatic ductal system, as evidenced on the cholangiogram.    Over the last several days, patient has not had fever but has developed progressive leukocytosis and had persistent elevation of hepatic transaminases, alkaline phosphatase and total bilirubin.  08/28/2023, underwent placement of internal/external biliary drain into the left hepatic ductal system to decompress.  However, effectively no output from this new enteral/external biliary drain.  Drain was upsized 6/12 from 8Fr -> 12Fr catheter.    Throughout much of the day, little to no output.  With aggressive aspiration and flushing by IR today, thin bilious fluid is now flowing.      Patient scheduled to return to IR tomorrow to undergo further upsizing of internal/external biliary drain (12 Fr -> 20 Fr) catheter   please continue amp sulbactam  Duration of antibiotic therapy will be dependent on how quickly we can decompress the biliary tree and improve her leukocytosis.    Please feel free to contact me with questions over the weekend.  One of my colleagues will begin seeing the patient Monday 6/16.          Interval Hx       Feeling well today.  Overnight, no significant output at all from  the internal/external biliary drain.  Evaluated by IR physician.  Drain aspirated and flushed more aggressively.  Particulate matter was removed.  Bilious output was then obtained, now with 150-200 mL over the last few hours.    Eating small amounts of food. No nausea. No cough or HA.  No new rash.    Afebrile.  Blood pressure stable.  No tachycardia.  She continues on room air.      WBC: 23 -> 38.8 -> 32.5 -> 23.6  Hemoglobin: 8 -> 7.5 -> 7.6  Platelets: 157  Creatinine: 3.15 -> 3.11 -> 3.28 -> 3.1  AST: 80  ALT: 66  Alkaline phosphatase: 326 -> 292 -> 287  T. bili: 16.4 -> 17.7    Net drain output over previous 24 hours:  Left anterior abdomen internal/external biliary drain: 150-200 mL late this afternoon      Antimicrobial Start date End date   cefepime  6/2 6/2   Pip/tazo 6/2 6/6   Amp/sulb 6/11 active                       Estimated Creatinine Clearance: 15 mL/min (A) (based on SCr of 3.1 mg/dL (H)).      Medications   Scheduled Meds:ampicillin /sulbactam (UNASYN ) 3 g in sodium chloride  0.9% (NS) 100 mL IVPB (MB+), 3 g, Intravenous, Q12H*  ERGOcalciferoL  (vitamin D2) (DRISDOL ) capsule 50,000 Units, 50,000 Units, Oral, Q7 Days  heparin  (porcine) PF syringe 5,000 Units, 5,000 Units, Subcutaneous, Q8H  pantoprazole  (PROTONIX ) injection 40 mg, 40 mg, Intravenous, BID(11-21)  sodium chloride  0.9 % (flush) syringe 10 mL, 10 mL, Intra-catheter, BID    Continuous Infusions:      PRN and Respiratory Meds:acetaminophen  Q6H PRN, alteplase  BID PRN, alum-mag hydroxide-simeth Q4H PRN **AND** [EXPIRED] lidocaine  hcl viscous ONCE, calcium  carbonate Q6H PRN, cetirizine  QDAY PRN, diphenoxylate -atropine  QID PRN, fentaNYL  citrate PF Q3H while awake PRN, hydrOXYzine  HCL Q6H PRN, loperamide  QID PRN, melatonin QHS PRN, ondansetron  Q6H PRN **OR** ondansetron  (ZOFRAN ) IV Q6H PRN, oxyCODONE  Q4H PRN, polyethylene glycol 3350  QDAY PRN, sennosides-docusate sodium  QDAY PRN, trimethobenzamide  Q6H PRN      Allergies     Allergies   Allergen Reactions    Indomethacin  SEE COMMENTS     Pain for several days after suppository post ERCP. Tolerated IV but not PR    Shrimp SEE COMMENTS     Eye irritation. Namely blood vessel issues in the eyes        Physical Examination                          Vital Signs: Last                  Vital Signs: 24 Hour Range   BP: 142/67 (06/13 0809)  Temp: 36.6 ?C (97.8 ?F) (06/13 0809)  Pulse: 66 (06/13 0809)  Respirations: 18 PER MINUTE (06/13 0809)  SpO2: 95 % (06/13 0809)  O2 Device: None (Room air) (06/13 0809) BP: (135-168)/(61-70)   Temp:  [36.5 ?C (97.7 ?F)-36.9 ?C (98.4 ?F)]   Pulse:  [66-76]   Respirations:  [16 PER MINUTE-18 PER MINUTE]   SpO2:  [95 %-99 %]   O2 Device: None (Room air)     Gen: Awake, alert, NAD at rest.  Fatigued appearing.  General icterus  HEENT: EOMI, scleral icterus present, no visible thrush  Neck: Supple  Lungs: Clear  Heart: Regular  Abd: + BS, soft, mild tenderness in upper abdomen.  Anterior internal/external biliary drain in place.  Thin bilious fluid within collection chamber ostomy intact without surrounding tenderness  Ext: 1+ lower extremity edema  Skin: Generalized icterus, no acute rash  Musculoskel: No warm or swollen joints  Psych: Oriented x 3 with normal mood and affect    Lines:    Lab Review   Hematology  Recent Labs     08/29/23  0229 08/30/23  0256 08/31/23  0222   WBC 38.80* 32.50* 23.60*   HGB 8.0* 7.5* 7.6*   HCT 23.9* 21.3* 22.0*   PLTCT 157 164 157   INR 1.2 1.2 1.2     Chemistry  Recent Labs     08/29/23  0229 08/30/23  0256 08/31/23  0222   NA 138 135* 138   K 4.0 3.9 3.7   CL 103 102 103   CO2 25 24 25    BUN 36* 34* 33*   CR 3.11* 3.28* 3.10*   GFR 16* 15* 16*   GLU 149* 146* 131*   CA 8.2* 7.9* 8.0*   PO4 2.6 2.8 3.0   ALBUMIN  2.2* 2.2* 2.0*   ALKPHOS 326* 292* 287*   AST 93* 75* 80*   ALT 65* 60* 66*   TOTBILI 16.6* 16.4* 17.7*       Microbiology, Radiology and other Diagnostics Review   Microbiology data reviewed.    Pertinent radiology images viewed.       Garnette Samuel, MD   Contact via Voalte  Transplant & Immunocompromised Infectious Diseases

## 2023-08-31 NOTE — Case Management (ED)
 Case Management Progress Note    NAME:Vicki Buchanan                          MRN: [id number]              DOB:11/19/1955          AGE: 68 y.o.  ADMISSION DATE: 08/20/2023             DAYS ADMITTED: LOS: 11 days      Today's Date: 08/31/2023    PLAN: DC plans are ongoing at this time.  Pt had Biliary drain upsize on 6/12.  Cont to monitor outpt and trend bili.   Expected Discharge Date: 09/03/2023   Is Patient Medically Stable: No, Please explain: leukocytosis and elevated LFTs  Are there Barriers to Discharge? no    INTERVENTION/DISPOSITION:  Discharge Planning              Discharge Planning: Outpatient Rehabilitation    NCM reviewed EMR and participated in Med Onc huddle  PT recommending OP PT on DC. Previous NCM assisted w/providing OP PT centers to [facility].  PTA pt received IVFs through [facility].  [place] - POC @ [phone]   Transportation              Does the Patient Need Case Management to Arrange Discharge Transport? (ex: facility, ambulance, wheelchair/stretcher, Medicaid, cab, other): No  Will the Patient Use Family Transport?: Yes  Transportation Name, Phone and Availability #1: Husband Vicki Buchanan [phone]  Support              Support: Huddle/team update  Info or Referral                 Positive SDOH Domains and Potential Barriers                   Medication Needs              Medication Needs: No Needs Identified                                                                                                                                          Financial              Financial: No Needs Identified  Legal              Legal: No Needs Identified  Other                 Discharge Disposition  Wilburt Dawn, RN    Wilburt Dawn, RN BSN  Integrated Nurse Case Production designer, theatre/television/film  Available on Voalte  [phone]

## 2023-08-31 NOTE — Unmapped
 Patient/family declined:  Bed/chair alarm and W/in arms reach during toileting/showering.    Patient/family educated on importance of intervention to their safety/quality of care. Patient/family continues to decline care.    Reason Why Patient/Family declined:  Husband helping patient to ambulate.    Individualized safety and/or care plan implemented. If additional safety measures implemented, please list them.     Escalated to:  Unit coordinator/charge nurse.

## 2023-08-31 NOTE — Progress Notes
 PHYSICAL THERAPY  PROGRESS NOTE          Name: Vicki Buchanan   MRN: [id number]     DOB: 1955-11-24      Age: 68 y.o.  Admission Date: 08/20/2023     LOS: 11 days     Date of Service: 08/31/2023        Attempt #1 ([phone]): Patient supine upon arrival. Politely declined therapy at this moment, requested that we come back after eating breakfast. Will follow up as indicated.     Mobility  Patient Turn/Position: Supine  Mobility Level [facility] Highest Level of Mobility (JH-HLM): Walk 25 feet or more (to doorway/hallway)  Distance Walked (feet): 50 ft  Level of Assistance: Assist X1  Assistive Device: Walker  Activity Limited By: Fatigue;Pain    Subjective  Significant Hospital Events: 68 y.o.   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 Biliary drain placed 6/10. Biliary drain upsize 6/12.  Mental / Cognitive: Alert;Oriented;Cooperative;Follows commands  Pain: Complains of pain  Pain level: Before activity  Pain Location: Chest  Pain Interventions: Patient agrees to participate in therapy with current pain level  Persons Present: Spouse;Physical Therapist;Student    Home Living Situation  Lives With: Spouse/significant other  Type of Home: House  Entry Stairs: 2  Comments: no handrails  In-Home Stairs: 1 flight;Rail on right when ascending  Comments: bedroom upstairs  Bathroom Setup: Tub only;Walk in shower  Comments: Tub on upstairs level and shower in downstairs  Patient Owned Equipment: Bathing: Shower chair;Cane: Single point;Walker with wheels;Wheelchair    Prior Level of Function  Level Of Independence: Independent with ADL and community mobility without device;Independent with ADL and community mobility with endurance limitations  History of Falls in Past 3 Months: No    Bed Mobility/Transfer  Transfer Type: Sit to/from Stand  Transfer: Assistance Level: To/From;Bed Side Chair;Minimal Assist  Transfer: Assistive Device: Nurse, adult  Transfers: Type Of Assistance: Verbal Cues;For Strength Deficit  Other Transfer Type: Stand Pivot  Other Transfer: Assistance Level: To/From;Bed Side Chair;Minimal Assist (CGA)  Other Transfer: Assistive Device: Roller Walker  Other Transfer: Type Of Assistance: Verbal Cues  End Of Activity Status: Instructed Patient to Request Assist with Mobility;Instructed Patient to Use Call Light;Up in Chair;Nursing Notified  Comments: Initially, patient performed sit to stand without walker, providing min assist. Required multiple attempts. Patient reported that she was not using an assistive device to ambulate in room before therapy today, demos crouched posture due to pain post surgery. recommended that she use the rolling walker for more stability with sit to stand transfers and ambulation and decrease pain.    Gait  Gait Distance: 50 feet  Gait: Assistance Level: Minimal Assist (CGA)  Gait: Assistive Device: Roller Walker  Gait: Descriptors: Pace: Futures trader;No balance loss;Normal step length  Comments: Used rolling walker for stability. Ambulated within room only per patient's request. Pain improved with assistive device  Stairs: Number Climbed: 5  Stairs: Descriptors: Ascend;Descend;Non-Reciprocal (Portable curb step)  Stairs: Assistance Level: Minimal Assist (CGA)  Stairs: Assistive Device: Roller Walker;Hand Hold Assist;One Rail  Activity Limited By: Complaint of Fatigue  Comments: Patient declined trialing stairs by gym, instead used portable curb step  in room to simulate home stairs. Initially, used rolling walker for provide B UE support but then switched to hand held assist on left side with using R counter for right side for support to simulate R rail at home. Provided cues to lead up with the strong side (right leg). Performed x 5 reps; 3 reps with rolling walker + 2 reps with hand held/counter.    Education  Persons Educated: Patient/Family  Patient Barriers To Learning: None Noted  Teaching Methods: Verbal Instruction  Patient Response: Verbalized Understanding  Topics: Plan/Goals of PT Interventions;Use of Assistive Device/Orthosis;Mobility Progression;Safety Awareness;Recommend Continued Therapy;Therapy Schedule    Assessment/Progress  Impaired Mobility Due To: Decreased Activity Tolerance;Medical Status Limitation;Decreased Strength;Pain  Assessment/Progress: Should Improve w/ Continued PT  Comments: Performed stair training on portable curb step within room to simulate home stairs, no observations of knee buckling with ascending/descending. Provided verbal cues throughout. Patient also ambulated within room, trialed rolling walker and requested that pt continue to use it for safety and more stability. No reports of unsteadiness but patient did request that we stay within the room because of feeling more comfortable with short distances. Was independent prior to admission. Would benefit from continued therapy to return to PLOF.    AM-PAC 6 Clicks Basic Mobility Inpatient  Turning from your back to your side while in a flat bed without using bed rails: A Little  Moving from lying on your back to sitting on the side of a flat bed without using bedrails : A Little  Moving to and from a bed to a chair (including a wheelchair): A Little  Standing up from a chair using your arms (e.g. wheelchair, or bedside chair): A Little  To walk in hospital room: A Little  Climbing 3-5 steps with a railing: A Little  Basic Mobility Inpatient Raw Score: 18  Standardized (T-scale) Score: 41.05  AM-PAC Basic Mobility Functional Stage: 34-51 Limited Mobility Indoors  Mobility Goal [facility]: JH-HLM 6 Walk >= 10 steps    Goals  Goal Formulation: With Patient/Family  Time For Goal Achievement: 5 days  Patient Will Go Supine To/From Sit: Independently, Ongoing  Patient Will Transfer Bed/Chair: Independently, Ongoing  Patient Will Transfer Sit to Stand: Independently, Ongoing  Patient Will Ambulate: Greater than 200 Feet, w/ No Device, Independently, Ongoing  Patient Will Go Up / Down Stairs: 1-2 Flights, w/ Stand By Assist, Ongoing    Plan  Treatment Interventions: Mobility training;Endurance training  Plan Frequency: 3-5 Days per Week  PT Plan for Next Visit: progress gait distance and activity tolerance, trial stairs by gym    PT Discharge Recommendations  Recommendation: Home with intermittent supervision/assistance  Recommendation for Therapy Post Discharge: Outpatient  Patient Currently Requires Physical Assist With: All mobility;All personal care ADLs;All home functioning ADLs  Patient Currently Requires Equipment: Owns what is needed    Therapist: Ai-Nhi Vivian Jakiya Bookbinder, SPT  Date: 08/31/2023

## 2023-08-31 NOTE — Progress Notes
 Renal  Progress Note    Name:  Vicki Buchanan   Today's Date:  08/31/2023  Admission Date: 08/20/2023  LOS: 11 days                     Assessment/Plan:    Principal Problem:    Cholangitis (CMS-HCC)  Active Problems:    Peritoneal carcinomatosis (CMS-HCC)    Cancer of appendix (CMS-HCC)    Hyperbilirubinemia    Moderate malnutrition        CKDG3b/G4  - Baseline Cr 2.8-3.0  - UA with 2+ turbid, 2+ protein, 3+ LE  - UPCR 0.6  - CT abdomen obtained 3/11 showing mild bilateral renal atrophy, with left greater than right; no evidence of obstruction/hydronephrosis  - Suspected etiology of CKD: ATI related to dehydration, sepsis. Obstructive uropathy. Relative hypotension. Potential medication induced.   - Overall renal function stable at time of hospitalization     Dehydration (chronic)  - Presumed 2/2 ostomy output  - Bicarb stable 26, Na slightly below goal  - Receiving IVF PRN as outpatient, usually thrice weekly 1-2 liters     Hypomagnesemia (chronic)  - 2/2 losses  - obtaining IV mag infusions as needed, most recent level 1.5     Anemia  - Hgb 6.9  - %sat 27, TIBC 222, Ferritin 1645     Biliary obstruction  Cholangitis  - s/p ERCP x3, with failed stents  - now s/p PTC with drain upsize from 12 to 58F on  6/13  - Currently on unasyn      Recommendations:  - Cr remains around baseline  - Would continue to match outputs with LR to maintain net even to slightly net positive, include all sources in tabulation. Would try and do this daily.   - Monitor for hyponatremia/salt wasting related to biliary drain output  - Her serum albumin  has been downtrending, I have encouraged her to try and increase her protein intake.   - Transfuse to goal Hgb >7.1  - PPI use acceptable in this circumstance  - BP has been labile, would just continue to monitor for now. Suspect pain is contributing.   - Continue her PO PRN Tums   - Agree with high dose vitamin D  replacement  - Will need a follow up PTH at some point  - Will add an ionized calcium  level to her AM labs just to ensure she is in stable range  - Please continue to check mag and phos at least once daily. Replace aggressively  - Dose meds for GFR ~15-20ml/min    Once she gets closer to discharge, please notify me directly so that I can arrange hospital follow up in my clinic.     Appreciate all services involved in the care of Ms. Legler      ________________________________________________________________________    Subjective  Yunuen Mordan is a 68 y.o. female.  Seen resting comfortably in room with husband at bedside. States that overall she feels like she is doing okay. Appetite still diminished and having trouble getting protein secondary to bloating and high output. She is working towards trying to get protein supplements in during the evening. Continues to have good urine output. Feels euvolemic on exam. Occasionally feels dehydrated but says that additional IVF alleviates symptoms.     On ROS, denies N/V, SOB.    Medications  Scheduled Meds:ampicillin /sulbactam (UNASYN ) 3 g in sodium chloride  0.9% (NS) 100 mL IVPB (MB+), 3 g, Intravenous, Q12H*  ERGOcalciferoL  (vitamin D2) (  DRISDOL ) capsule 50,000 Units, 50,000 Units, Oral, Q7 Days  heparin  (porcine) PF syringe 5,000 Units, 5,000 Units, Subcutaneous, Q8H  pantoprazole  (PROTONIX ) injection 40 mg, 40 mg, Intravenous, BID(11-21)  sodium chloride  0.9 % (flush) syringe 10 mL, 10 mL, Intra-catheter, BID    Continuous Infusions:  PRN and Respiratory Meds:acetaminophen  Q6H PRN, alteplase  BID PRN, alum-mag hydroxide-simeth Q4H PRN **AND** [EXPIRED] lidocaine  hcl viscous ONCE, calcium  carbonate Q6H PRN, cetirizine  QDAY PRN, diphenoxylate -atropine  QID PRN, fentaNYL  citrate PF Q3H while awake PRN, hydrOXYzine  HCL Q6H PRN, loperamide  QID PRN, melatonin QHS PRN, ondansetron  Q6H PRN **OR** ondansetron  (ZOFRAN ) IV Q6H PRN, oxyCODONE  Q4H PRN, polyethylene glycol 3350  QDAY PRN, sennosides-docusate sodium  QDAY PRN, trimethobenzamide  Q6H PRN      Objective                       Vital Signs: Last Filed                 Vital Signs: 24 Hour Range   BP: 169/66 (06/13 1947)  Temp: 37 ?C (98.6 ?F) (06/13 1947)  Pulse: 73 (06/13 1947)  Respirations: 18 PER MINUTE (06/13 1947)  SpO2: 99 % (06/13 1947)  O2 Device: None (Room air) (06/13 1947) BP: (135-169)/(57-67)   Temp:  [36.5 ?C (97.7 ?F)-37 ?C (98.6 ?F)]   Pulse:  [62-73]   Respirations:  [17 PER MINUTE-18 PER MINUTE]   SpO2:  [95 %-99 %]   O2 Device: None (Room air)   Intensity Pain Scale (Self Report): 4 (08/31/23 1054) Vitals:    08/24/23 0557 08/29/23 0856 08/31/23 0244   Weight: 58.7 kg (129 lb 6.4 oz) 58.7 kg (129 lb 6.4 oz) 61.3 kg (135 lb 3.2 oz)       Intake/Output Summary:  (Last 24 hours)    Intake/Output Summary (Last 24 hours) at 08/31/2023 2222  Last data filed at 08/31/2023 2209  Gross per 24 hour   Intake 430 ml   Output 1250 ml   Net -820 ml           Physical Exam  General - no acute distress  HEENT - NC/AT, scleral icterus  Chest - Effort is normal, symmetric chest rise/expansion  Ext - No visible edema  Neuro - AO x3, no asterixis, grossly non-focal  Psych - Normal mood and behavior    Lab Review  Pertinent labs reviewed    Point of Care Testing  (Last 24 hours)  Glucose: (!) 131 (08/31/23 0222)    Radiology and other Diagnostics Review:    No pertinent radiology.    Lorrene Fuelling, MD, MHS  Department of Internal Medicine - Nephrology

## 2023-08-31 NOTE — Progress Notes
 General Progress Note    Name: Vicki Buchanan        MRN: [id number]          DOB: 1955-08-27            Age: 68 y.o.  Admission Date: 08/20/2023       LOS: 11 days    Date of Service: 08/31/2023    Assessment/Plan:    Assessment & Plan  Cholangitis (CMS-HCC)    Peritoneal carcinomatosis (CMS-HCC)    Cancer of appendix (CMS-HCC)    Hyperbilirubinemia    Moderate malnutrition    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 She underwent ERCP 08/21/23 with viabil stent placement in the ampulla, complicated by post ERCP pancreatitis.  She has also had hypotension requiring significant IV fluids (she gets 1-2L MWF as an outpatient).      Mucinous adenocarcinoma of the appendix  hyperbilirubinemia  malignant biliary obstruction  ascending cholangitis   post ERCP pancreatitis   - admitted with rising bili, leukocytosis, anemia  - oncology consulted, appreciate assistance  - recently hospitalized at [facility] with pus noted to be coming out of her bile ducts during ERCP 08/06/23  - biliary consulted, s/p ERCP 08/21/23: Uncovered metal stent coming out of the ampulla with double-pigtail plastic stent going through the lumen of the metal stent.  Food and stent debris seen in stent lumen.  Occlusion cholangiogram performed that showed narrowing in the midportion of the stent consistent with tumor/tissue ingrowth.  Right hepatic duct mildly dilated without any obvious stricture.  Left intrahepatic ducts normal-appearing.  A 10 mm x 8 cm viabil stent was deployed within the previously placed uncovered biliary stent with good outflow of contrast.   - CT A/P 6/5:   Development of small left pleural effusion and minimal basilar bronchiolitis.  No small bowel obstruction, ascites, or free air. Redemonstration of extensive partially calcified peritoneal metastatic disease.   New small subcutaneous nodule right lateral pelvis which is probably a new metastasis.   Right ischial pressure ulcer.   - abdominal pain, elevated lipase post ERCP: post-procedure pancreatitis  - continue aggressive IVF  - monitor LFTs, bilirubin--bili not significantly lower post ERCP, may need further drainage via IR  - continue analgesics  - ID consulted  - continue zosyn  (6/2--6/6)  - blood cultures 6/2 with NG   > PTC ordered in IR to access smaller peripheral intrahepatic ducts and potentially help drain the left system.   -Status post placement of 8 French internal/external biliary drain via a dilated peripheral left duct.  Dilated left biliary tree.  - Biliary drain should be placed to gravity drainage until bilirubin normalizes and flushed with 10 mL normal saline twice a day.  Patient follow-up in 3 months for routine preventive maintenance of biliary catheters.  -Bedside nursing had trouble with flushing the drain on 6/10. Will CTM  - T. bili elevated at 16.6, better from 18.4 in 6/10, AST better at 93 from 108, ALT better at 65 from 73, alk phos stable at 326 from 323    Leukocytosis  > discussed with ID who were consulted, but signed off. They requested a new consult on 6/10.  > re-consult ID per recommendation of Dr. Mable.  - WBC count worse today at 38.8 from 23, reached out to Dr. Jakie from ID on 6/11-appreciate recommendations,  recommendations consulted IR for biliary drain check and upsize on 6/11-pending.  Patient has been n.p.o. and did not eat lunch today in anticipation of procedure  - IR drain upsized on 6/12  - Started on Unasyn  per ID recommendations    Anemia  > 1 unit prbcs ordered on 6/7.  - Hemoglobin today 8 from 8.7-9 on 6/10, denies any bleeding, monitor    Hypotension--improved   - Repeated rapid responses 6/5-6/6 for hypotension, received 5L IVF  - Echo 08/22/23: The left ventricular size is normal. The left ventricular wall thickness is normal. Normal geometry.  The left ventricular systolic function is normal. The ejection fraction by Simpson's biplane method is 61%. There are no segmental wall motion abnormalities. Normal left ventricular diastolic function.  The right ventricular size is normal. The right ventricular systolic function is normal.  Nonspecific thickening of the mitral valve with mild to moderate regurgitation, no stenosis.  IVC was not visualized on the study.  No pericardial effusion.  - hold home hydralazine , carvedilol   - stop IVF  - patient gets 1-2L IV fluids MWF as an outpatient, will need to watch her ins/outs   - Ordered 1 L of NS on 6/11    AKI on CKD4  chronic mild L hydronephrosis   - follows with Dr. Marlo as outpatient  - Creatinine stable today 3.11 from 3.15, BUN stable at 36 from 37  - Ordered  continuous fluids. Will re-eval improvement.     Hypomagnesemia:  - Replace magnesium  IV on 6/11, magnesium  1.5 today, recheck in a.m.    GERD  - continue PPI  - add PRN GI cocktail     FEN:   Fluids: LR  Monitor and replace electrolytes as needed  Diet: cardiac     DVT PPX: heparin  SQ     CODE STATUS: FULL CODE   NOK: husband, updated at bedside today     Disposition:  continue admission     Today, 08/31/2023, I personally reviewed vitals, labs, imaging, telemetry, nursing notes, and consultant notes.  I also discussed this patient's care with the patient, patient's family, the Child psychotherapist, the case manager, the pharmacist, and the consultant from nephrology.  Furthermore, I ordered labs as part of continued management of Vicki Buchanan.  Discussed plan of care with patient and her husband and RN at bedside.  Discussed patient with IR NP today.  Discussed patient with NCM, social worker and pharmacist today      _______________________________________________________________________    Subjective   Vicki Buchanan is a 68 y.o. female.  Patient her family are present in room. We discussed labs and use of IV fluids.      Medications  Scheduled Meds:ampicillin /sulbactam (UNASYN ) 3 g in sodium chloride  0.9% (NS) 100 mL IVPB (MB+), 3 g, Intravenous, Q12H*  ERGOcalciferoL  (vitamin D2) (DRISDOL ) capsule 50,000 Units, 50,000 Units, Oral, Q7 Days  heparin  (porcine) PF syringe 5,000 Units, 5,000 Units, Subcutaneous, Q8H  pantoprazole  (PROTONIX ) injection 40 mg, 40 mg, Intravenous, API(88-78)  sodium chloride  0.9 % (flush) syringe 10 mL, 10 mL, Intra-catheter, BID    Continuous Infusions:   lactated ringers  infusion 125 mL/hr at 08/31/23 0716       PRN and Respiratory Meds:acetaminophen  Q6H PRN, alteplase  BID PRN, alum-mag hydroxide-simeth Q4H PRN **AND** [EXPIRED] lidocaine  hcl viscous ONCE, calcium  carbonate Q6H PRN, cetirizine  QDAY PRN, diphenoxylate -atropine  QID PRN, fentaNYL  citrate PF Q3H while awake PRN, hydrOXYzine  HCL Q6H PRN, loperamide  QID PRN, melatonin QHS PRN, ondansetron  Q6H PRN **OR**  ondansetron  (ZOFRAN ) IV Q6H PRN, oxyCODONE  Q4H PRN, polyethylene glycol 3350  QDAY PRN, sennosides-docusate sodium  QDAY PRN, trimethobenzamide  Q6H PRN      Objective                        Vital Signs: Last Filed                 Vital Signs: 24 Hour Range   BP: 142/67 (06/13 0809)  Temp: 36.6 ?C (97.8 ?F) (06/13 0809)  Pulse: 66 (06/13 0809)  Respirations: 18 PER MINUTE (06/13 0809)  SpO2: 95 % (06/13 0809)  O2 Device: None (Room air) (06/13 0809) BP: (135-168)/(61-70)   Temp:  [36.5 ?C (97.7 ?F)-36.9 ?C (98.4 ?F)]   Pulse:  [66-76]   Respirations:  [16 PER MINUTE-18 PER MINUTE]   SpO2:  [95 %-99 %]   O2 Device: None (Room air)   Intensity Pain Scale (Self Report): 4 (08/31/23 1054) Vitals:    08/24/23 0557 08/29/23 0856 08/31/23 0244   Weight: 58.7 kg (129 lb 6.4 oz) 58.7 kg (129 lb 6.4 oz) 61.3 kg (135 lb 3.2 oz)         Intake/Output Summary:  (Last 24 hours)    Intake/Output Summary (Last 24 hours) at 08/31/2023 1242  Last data filed at 08/31/2023 1241  Gross per 24 hour   Intake 667 ml   Output 1130 ml   Net -463 ml     Stool Occurrence: 1        Physical Exam  General: alert, oriented, no acute distress, appears stated age  Head: normocephalic, atraumatic, no obvious abnormality  Eyes: conjunctivae icteric  Nose: nares normal  Throat: normal oropharynx.  Lips and tongue normal.   Lungs: clear to auscultation bilaterally  Heart: normal rate, regular rhythm.  S1 and S2 normal.  No murmur.   Abdomen: soft, non tender, non distended, normoactive bowel sounds.    Extremities: 1+ lower extremity edema  Peripheral pulses: 2+ radial and dorsalis pedis bilaterally   Skin: dry, jaundiced  Psych: mood good       Lab Review  24-hour labs:    Results for orders placed or performed during the hospital encounter of 08/20/23 (from the past 24 hours)   PROTIME INR (PT)    Collection Time: 08/31/23  2:22 AM   Result Value Ref Range    Protime 13.6 9.9 - 14.2 Seconds    INR 1.2 0.9 - 1.2   CBC AND DIFF    Collection Time: 08/31/23  2:22 AM   Result Value Ref Range    White Blood Cells 23.60 (H) 4.50 - 11.00 10*3/uL    Red Blood Cells 2.32 (L) 4.00 - 5.00 10*6/uL    Hemoglobin 7.6 (L) 12.0 - 15.0 g/dL    Hematocrit 77.9 (L) 36.0 - 45.0 %    MCV 94.9 80.0 - 100.0 fL    MCH 32.7 26.0 - 34.0 pg    MCHC 34.5 32.0 - 36.0 g/dL    RDW 77.6 (H) 88.9 - 15.0 %    Platelet Count 157 150 - 400 10*3/uL    MPV 10.0 7.0 - 11.0 fL   COMPREHENSIVE METABOLIC PANEL    Collection Time: 08/31/23  2:22 AM   Result Value Ref Range    Sodium 138 137 - 147 mmol/L    Potassium 3.7 3.5 - 5.1 mmol/L    Chloride 103 98 - 110 mmol/L    Glucose 131 (H) 70 - 100  mg/dL    Blood Urea Nitrogen 33 (H) 7 - 25 mg/dL    Creatinine 6.89 (H) 0.40 - 1.00 mg/dL    Calcium  8.0 (L) 8.5 - 10.6 mg/dL    Total Protein 5.4 (L) 6.0 - 8.0 g/dL    Total Bilirubin 82.2 (H) 0.2 - 1.3 mg/dL    Albumin  2.0 (L) 3.5 - 5.0 g/dL    Alk Phosphatase 712 (H) 25 - 110 U/L    AST 80 (H) 7 - 40 U/L    ALT 66 (H) 7 - 56 U/L    CO2 25 21 - 30 mmol/L    Anion Gap 10 3 - 12 Glomerular Filtration Rate (GFR) 16 (L) >60 mL/min   MAGNESIUM     Collection Time: 08/31/23  2:22 AM   Result Value Ref Range    Magnesium  1.6 1.6 - 2.6 mg/dL   PHOSPHORUS    Collection Time: 08/31/23  2:22 AM   Result Value Ref Range    Phosphorus 3.0 2.0 - 4.5 mg/dL   MANUAL DIFF    Collection Time: 08/31/23  2:22 AM   Result Value Ref Range    ANISO Present     HYPO Present     POLY Present     Target Present     Platelet Estimate Normal     Segmented Neutrophils 86 (H) 41 - 77 %    Lymphocytes 3 (L) 24 - 44 %    Monocytes 9 4 - 12 %    Metamyelocyte 1 %    Myelocyte 1 %    Absolute Neutrophil Count Manual 20.3 (H) 1.8 - 7.0 10*3/uL       Point of Care Testing  (Last 24 hours)  Glucose: (!) 131 (08/31/23 0222)    Radiology and other Diagnostics Review:    tele: sinus-sinus brady      Willo LELON Phenes, MD

## 2023-08-31 NOTE — Consults
 Pre Procedure History and Physical/Sedation Plan    **A full consult has been completed regarding this patient during this admission previously. Therefore this consult will only link to an updated pre-procedure H&P. For details of patient assessment, please see previous IR consult. Thank you.    Procedure Date: 08/31/2023     Planned Procedure(s):  Biliary drain upsize (12 to 39F per staff review)    Procedural code status: Full Code    Indication:  Ongoing hyperbilirubinemia with copious debris/clots draining from tube.  __________________________________________________________________    Chief Complaint:  Biliary obstruction    History of Present Illness: Vicki Buchanan is a 68 y.o. female with a history as listed below who presents today for procedure.    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/2017    Severe malnutrition 04/04/2017    SBO (small bowel obstruction) (CMS-HCC) 03/23/2017    Peritoneal carcinomatosis (CMS-HCC) 03/07/2017     Past Medical History:    Acid reflux    Allergy    Bundle branch block    Cancer (CMS-HCC)    Cancer of appendix (CMS-HCC)    Cancer of colon (CMS-HCC)    Pneumonia    Sarcoma (CMS-HCC)      Surgical History:   Procedure Laterality Date    CESAREAN SECTION  1981    HX CHOLECYSTECTOMY  1995    LAPAROSCOPY  02/2017    biopsy peritoneal    COLONOSCOPY DIAGNOSTIC WITH SPECIMEN COLLECTION BY BRUSHING/ WASHING - FLEXIBLE N/A 03/19/2017    Performed by Malissa Norleen LABOR, MD at [facility] ENDO    COLONOSCOPY WITH BIOPSY - FLEXIBLE  03/19/2017    Performed by Malissa Norleen LABOR, MD at [facility] ENDO    EXPLORATORY LAPAROTOMY, DIVERTING LOOP ILEOSTOMY N/A 04/03/2017    Performed by Al-Kasspooles, Mazin, MD at [facility] OR    INSERTION TUNNELED CENTRAL VENOUS CATHETER - AGE 43 YEARS AND OVER Left 04/23/2017    Performed by Al-Kasspooles, Mazin, MD at [facility] OR    CYSTOURETHROSCOPY WITH INDWELLING URETERAL STENT INSERTION Bilateral 04/09/2018    Performed by Erskin Lenis, MD at [facility] OR    CYSTOURETHROSCOPY WITH URETERAL CATHETERIZATION WITH/ WITHOUT IRRIGATION/ INSTILLATION/ URETEROPYELOGRAPHY Bilateral 04/09/2018    Performed by Erskin Lenis, MD at [facility] OR    CYSTOURETHROSCOPY WITH INDWELLING URETERAL STENT EXCHANGE (RIGHT 6 x 26cm / LEFT 6 x 28cm) Bilateral 08/08/2018    Performed by Erskin Lenis, MD at [facility] OR    RETROGRADE UROGRAPHY WITH/ WITHOUT KUB Bilateral 08/08/2018    Performed by Erskin Lenis, MD at [facility] OR    ENDOSCOPIC RETROGRADE CHOLANGIOPANCREATOGRAPHY WITH SPHINCTEROTOMY/ PAPILLOTOMY N/A 09/16/2021    Performed by Rogers Kipper, MD at [facility] ENDO    ESOPHAGOGASTRODUODENOSCOPY WITH ENDOSCOPIC ULTRASOUND EXAMINATION - FLEXIBLE  09/16/2021    Performed by Rogers Kipper, MD at [facility] ENDO    ENDOSCOPIC RETROGRADE CHOLANGIOPANCREATOGRAPHY WITH PLACEMENT ENDOSCOPIC STENT INTO BILIARY/ PANCREATIC DUCT - EACH STENT  09/16/2021    Performed by Rogers Kipper, MD at [facility] ENDO    COLONOSCOPY DIAGNOSTIC WITH SPECIMEN COLLECTION  BY BRUSHING/ WASHING - FLEXIBLE N/A 09/26/2021    Performed by Rogers Kipper, MD at [facility] OR    ENDOSCOPIC RETROGRADE CHOLANGIOPANCREATOGRAPHY WITH PLACEMENT ENDOSCOPIC STENT INTO BILIARY/ PANCREATIC DUCT - EACH STENT  09/29/2021    Performed by Noralyn Blossom, MD at [facility] ENDO    ENDOSCOPIC RETROGRADE CHOLANGIOPANCREATOGRAPHY with plastic stent exchange N/A 10/27/2021    Performed by Noralyn Blossom, MD at [facility] ENDO    ENDOSCOPIC RETROGRADE CHOLANGIOPANCREATOGRAPHY WITH ABLATION TUMOR/ POLYP/ OTHER LESION  10/27/2021    Performed by Noralyn Blossom, MD at [facility] ENDO    ENDOSCOPIC RETROGRADE CHOLANGIOPANCREATOGRAPHY WITH REMOVAL CALCULI/ DEBRIS FROM BILIARY/ PANCREATIC DUCT  10/27/2021    Performed by Noralyn Blossom, MD at [facility] ENDO    ENDOSCOPIC RETROGRADE CHOLANGIOPANCREATOGRAPHY  plastic stent replacement N/A 12/22/2021    Performed by Noralyn Blossom, MD at [facility] ENDO    ENDOSCOPIC RETROGRADE CHOLANGIOPANCREATOGRAPHY N/A 03/30/2022    Performed by Noralyn Blossom, MD at [facility] ENDO    ENDOSCOPIC RETROGRADE CHOLANGIOPANCREATOGRAPHY WITH REMOVAL AND EXCHANGE OF STENT BILIARY/ PANCREATIC DUCT - EACH STENT EXCHANGED N/A 03/30/2022    Performed by Noralyn Blossom, MD at [facility] ENDO    ENDOSCOPIC RETROGRADE CHOLANGIOPANCREATOGRAPHY WITH REMOVAL AND EXCHANGE OF STENT BILIARY/ PANCREATIC DUCT - EACH STENT EXCHANGED N/A 07/27/2022    Performed by Noralyn Blossom, MD at [facility] ENDO    ENDOSCOPIC RETROGRADE CHOLANGIOPANCREATOGRAPHY N/A 11/02/2022    Performed by Noralyn Blossom, MD at [facility] ENDO    ENDOSCOPIC RETROGRADE CHOLANGIOPANCREATOGRAPHY WITH REMOVAL AND EXCHANGE OF STENT BILIARY/ PANCREATIC DUCT - EACH STENT EXCHANGED N/A 11/02/2022    Performed by Noralyn Blossom, MD at [facility] ENDO    ENDOSCOPIC RETROGRADE CHOLANGIOPANCREATOGRAPHY WITH REMOVAL AND EXCHANGE OF STENT BILIARY/ PANCREATIC DUCT - EACH STENT EXCHANGED N/A 01/25/2023    Performed by Noralyn Blossom, MD at [facility] ENDO    ENDOSCOPIC RETROGRADE CHOLANGIOPANCREATOGRAPHY WITH REMOVAL AND EXCHANGE OF STENT BILIARY/ PANCREATIC DUCT - EACH STENT EXCHANGED N/A 04/26/2023    Performed by Noralyn Blossom, MD at [facility] ENDO    ENDOSCOPIC RETROGRADE CHOLANGIOPANCREATOGRAPHY WITH REMOVAL AND EXCHANGE OF STENT BILIARY/ PANCREATIC DUCT - EACH STENT EXCHANGED N/A 07/26/2023    Performed by Noralyn Blossom, MD at [facility] ENDO    ENDOSCOPIC RETROGRADE CHOLANGIOPANCREATOGRAPHY WITH REMOVAL CALCULI/ DEBRIS FROM BILIARY/ PANCREATIC DUCT N/A 08/16/2023    Performed by Noralyn Blossom, MD at [facility] ENDO    ENDOSCOPIC RETROGRADE CHOLANGIOPANCREATOGRAPHY WITH REMOVAL AND EXCHANGE OF STENT BILIARY/ PANCREATIC DUCT - EACH STENT EXCHANGED N/A 08/16/2023    Performed by Noralyn Blossom, MD at [facility] ENDO    ENDOSCOPIC RETROGRADE CHOLANGIOPANCREATOGRAPHY, WITH NEOPLASM OR POLYP ABLATION N/A 08/16/2023    Performed by Noralyn Blossom, MD at [facility] ENDO    ENDOSCOPIC RETROGRADE CHOLANGIOPANCREATOGRAPHY WITH REMOVAL AND EXCHANGE OF STENT BILIARY/ PANCREATIC DUCT - EACH STENT EXCHANGED N/A 08/21/2023    Performed by Rogers Kipper, MD at [facility] ENDO    COLON SURGERY  7/20    COLON SURGERY  7/20    HX APPENDECTOMY  1/10    Guess on date    HX CESAREAN SECTION  4/81    HX HYSTERECTOMY  7/20    HX LOWER ANTERIOR RESECTION OF COLON      7/20    HX SALPINGO-OOPHORECTOMY      ILEOSTOMY OR JEJUNOSTOMY Right     LIVER DONOR SURGERY  12/24/21    Liver stents    OVARY SURGERY      Removed 7/20    PORTACATH PLACEMENT  1/20    PR  PNCRTECT PROX STOT W/PANCREATOJEJUNOSTOMY  7/20    STOMACH SURGERY      7/20    TUNNELED VENOUS PORT PLACEMENT Left 1/19    URETER STENT PLACEMENT Bilateral       Social History     Tobacco Use    Smoking status: Never    Smokeless tobacco: Never   Substance Use Topics    Alcohol use: Not Currently     Comment: rarely      Family History   Problem Relation Name Age of Onset    Diabetes Mother Murray     Cancer Father Lamar     Diabetes Brother Octaviano     Cancer-Lung Paternal Aunt Doris     Cancer-Breast Maternal Grandmother Helen     Diabetes Paternal Grandmother Annabelle       Medications Prior to Admission   Medication Sig Dispense Refill Last Dose/Taking    acetaminophen /lidocaine /antacid(#) (GI COCKTAIL) 1:1:1 oral suspension Take 30 mL by mouth every 4 hours as needed. 900 mL 3 Taking As Needed    apap-maalox-lidocaine  1:3:1 oral suspension (MIXTURE COMPOUND) Take 30 mL by mouth every 4 hours as needed. (Patient taking differently: Take  by mouth every 4 hours as needed.) 900 mL 0 Taking Differently    calcium  carbonate (TUMS) 500 mg (200 mg elemental calcium ) chewable tablet Chew one tablet by mouth every 6 hours as needed. 100 tablet  Taking As Needed carvediloL  (COREG ) 6.25 mg tablet Take one tablet by mouth twice daily. Take with food.  Hold if systolic blood pressure < 110 819 tablet 0 >1 Month    cetirizine  (ZYRTEC ) 10 mg tablet Take one tablet by mouth daily.   Taking    diphenoxylate -atropine  (LOMOTIL ) 2.5-0.025 mg tablet Take one tablet by mouth four times daily as needed for Diarrhea. 120 tablet 3 Taking As Needed    esomeprazole  DR (NEXIUM ) 20 mg capsule Take one capsule by mouth daily. Take on an empty stomach at least 1 hour before or 2 hours after food.   Taking    hydrOXYzine  HCL (ATARAX ) 25 mg tablet Take one tablet by mouth every 6 hours as needed for Itching.   Taking As Needed    [EXPIRED] levoFLOXacin  (LEVAQUIN ) 250 mg tablet Take three tablets by mouth daily for 1 day, THEN two tablets every 48 hours for 7 days. Take 3 tablets by mouth for 1 day then 48 hours later take 2 tablets every 48 hours for 7 days 9 tablet 0 08/19/2023 Evening    loperamide  (IMODIUM  A-D) 2 mg capsule Take one pill by mouth four times daily as needed 120 capsule 11 Taking    magnesium  oxide 400 mg magnesium  tablet Take one tablet by mouth twice daily. 90 tablet 11 Taking    ondansetron  (ZOFRAN  ODT) 8 mg rapid dissolve tablet DISSOLVE 1 TABLET UNDER THE TONGUE EVERY 8 HOURS AS NEEDED FOR NAUSEA AND VOMITING 60 tablet 0 Taking    potassium chloride  (KLOR-CON  SPRINKLE) 10 mEq capsule Take one capsule by mouth twice daily. If potassium < 3.5. Take with a meal and a full glass of water . 60 capsule 3 Past Month    sod chlor,sod bicarb/neti pot (SINUS WASH NETI POT SINI) Use as directed   Taking    traMADoL  (ULTRAM ) 50 mg tablet Take one tablet to two tablets by mouth daily as needed for Pain. 75 tablet 0 Taking As Needed     Allergies   Allergen Reactions    Indomethacin  SEE COMMENTS  Pain for several days after suppository post ERCP. Tolerated IV but not PR    Shrimp SEE COMMENTS     Eye irritation. Namely blood vessel issues in the eyes        Review of Systems  A comprehensive review of systems was negative.    Previous Personal Anesthetic/Sedation History:  Denies adverse events related to sedation/anesthesia.     Previous Family Anesthetic/Sedation History: Denies adverse events related to sedation/anesthesia.    Physical Exam:  Vital Signs: Last Filed In 24 Hours Vital Signs: 24 Hour Range   BP: 142/67 (06/13 0809)  Temp: 36.6 ?C (97.8 ?F) (06/13 0809)  Pulse: 66 (06/13 0809)  Respirations: 18 PER MINUTE (06/13 0809)  SpO2: 95 % (06/13 0809)  O2 Device: None (Room air) (06/13 0809) BP: (135-168)/(61-70)   Temp:  [36.5 ?C (97.7 ?F)-36.9 ?C (98.4 ?F)]   Pulse:  [66-76]   Respirations:  [16 PER MINUTE-18 PER MINUTE]   SpO2:  [95 %-99 %]   O2 Device: None (Room air)   Intensity Pain Scale (Self Report): 4 (08/31/23 1054)      General appearance: Alert and no distress noted.  Neurologic: Grossly normal.  Lungs: Non labored at rest.  Heart: Regular rate and rhythm  Abdomen: Non-distended; left PTC in place and intact    Airway:  airway assessment performed  Mallampati II (soft palate, uvula, fauces visible)   Anesthesia Classification:  ASA III (A patient with a severe systemic disease that limits activity, but is not incapacitating)  Pre procedure anxiolysis plan: Midazolam   Intra-procedural Sedation/Medication Plan: Fentanyl , Lidocaine , and Midazolam   Personal history of sedation complications: Denies adverse event.   Family history of sedation complications: Denies adverse event.   Medications for Reversal: Naloxone  and Flumazenil   Discussion/Reviews:  Physician has discussed risks and alternatives of this type of sedation and above planned procedures with patient  NPO Status: Acceptable  Pregnancy Status: N/A     Lab/Radiology/Other Diagnostic Tests:  Labs:  Pertinent labs reviewed           Camellia SHAUNNA Barter, APRN-NP  Pager [phone]

## 2023-09-01 ENCOUNTER — Encounter: Admit: 2023-09-01 | Discharge: 2023-09-01 | Payer: MEDICARE

## 2023-09-01 ENCOUNTER — Inpatient Hospital Stay: Admit: 2023-09-01 | Discharge: 2023-09-01 | Payer: MEDICARE

## 2023-09-01 LAB — COMPREHENSIVE METABOLIC PANEL
~~LOC~~ BKR ALBUMIN: 2 g/dL — ABNORMAL LOW (ref 3.5–5.0)
~~LOC~~ BKR BLD UREA NITROGEN: 30 mg/dL — ABNORMAL HIGH (ref 7–25)
~~LOC~~ BKR CALCIUM: 7.7 mg/dL — ABNORMAL LOW (ref 8.5–10.6)
~~LOC~~ BKR CREATININE: 3.1 mg/dL — ABNORMAL HIGH (ref 0.40–1.00)
~~LOC~~ BKR TOTAL BILIRUBIN: 17 mg/dL — ABNORMAL HIGH (ref 0.2–1.3)
~~LOC~~ BKR TOTAL PROTEIN: 5.5 g/dL — ABNORMAL LOW (ref 6.0–8.0)

## 2023-09-01 LAB — MANUAL DIFF
~~LOC~~ BKR BANDS - RELATIVE: 1 % (ref 0–10)
~~LOC~~ BKR LYMPHOCYTES - RELATIVE: 10 % — ABNORMAL LOW (ref 24–44)
~~LOC~~ BKR MONOCYTES - RELATIVE: 9 % (ref 4–12)
~~LOC~~ BKR NEUT+BANDS - ABSOLUTE: 13 10*3/uL — ABNORMAL HIGH (ref 1.8–7.0)

## 2023-09-01 LAB — CELL COUNT W/DIFF - FLUIDS

## 2023-09-01 MED ORDER — IOHEXOL 300 MG IODINE/ML IV SOLN
20 mL | Freq: Once | 0 refills | Status: CP
Start: 2023-09-01 — End: ?
  Administered 2023-09-01: 14:00:00 20 mL

## 2023-09-01 MED ORDER — FENTANYL CITRATE (PF) 50 MCG/ML IJ SOLN
0 refills | Status: CP
Start: 2023-09-01 — End: ?

## 2023-09-01 MED ORDER — FLUMAZENIL 0.1 MG/ML IV SOLN
.2 mg | INTRAVENOUS | 0 refills | Status: DC | PRN
Start: 2023-09-01 — End: 2023-09-08

## 2023-09-01 MED ORDER — POTASSIUM CHLORIDE 20 MEQ PO TBTQ
60 meq | Freq: Once | ORAL | 0 refills | Status: CP
Start: 2023-09-01 — End: ?
  Administered 2023-09-01: 18:00:00 60 meq via ORAL

## 2023-09-01 MED ORDER — MIDAZOLAM 1 MG/ML IJ SOLN
0 refills | Status: CP
Start: 2023-09-01 — End: ?

## 2023-09-01 MED ORDER — POTASSIUM CHLORIDE 20 MEQ PO TBTQ
20 meq | Freq: Once | ORAL | 0 refills | Status: CP
Start: 2023-09-01 — End: ?
  Administered 2023-09-01: 10:00:00 20 meq via ORAL

## 2023-09-01 MED ORDER — SODIUM CHLORIDE 0.9 % IJ SYRG
10 mL | Freq: Two times a day (BID) | 0 refills | Status: DC
Start: 2023-09-01 — End: 2023-09-08
  Administered 2023-09-01 – 2023-09-08 (×15): 10 mL

## 2023-09-01 MED ORDER — DIPHENHYDRAMINE HCL 50 MG/ML IJ SOLN
0 refills | Status: CP
Start: 2023-09-01 — End: ?

## 2023-09-01 MED ORDER — NALOXONE 0.4 MG/ML IJ SOLN
.08 mg | INTRAVENOUS | 0 refills | Status: DC | PRN
Start: 2023-09-01 — End: 2023-09-08

## 2023-09-01 MED ORDER — ONDANSETRON HCL (PF) 4 MG/2 ML IJ SOLN
0 refills | Status: CP
Start: 2023-09-01 — End: ?

## 2023-09-01 MED ORDER — SODIUM CHLORIDE 0.9% IV SOLP
0 refills | Status: CP
Start: 2023-09-01 — End: ?

## 2023-09-01 MED ORDER — MIDAZOLAM 1 MG/ML IJ SOLN
1 mg | Freq: Once | INTRAVENOUS | 0 refills | Status: AC
Start: 2023-09-01 — End: ?

## 2023-09-01 NOTE — Progress Notes
 Sedation physician present in room. Recent vitals and patient condition reviewed between sedating physician and nurse. Reassessment completed. Determination made to proceed with planned sedation.

## 2023-09-01 NOTE — Unmapped
 Immediate Post Procedure Note    Date:  09/01/2023                                         Attending Physician:   Dr. Chauncey Needles  Performing Provider:  Marolyn Pickler, MD    Consent:  Consent obtained from patient.  Time out performed: Consent obtained, correct patient verified, correct procedure verified, correct site verified, patient marked as necessary.  Pre/Post Procedure Diagnosis:  Biliary obstruction with indwelling tube  Indications:  Debris requiring larger tube      Procedure(s):  Fluoro guided upsize to 13fr biliary drain.     Findings:  Persistent biliary ductal dilation. Successful upsize to 61fr drain. Additional side holes cut in catheter to accommodate length of indwelling stent.       Flush drain QID w/ 20mL saline.        Estimated Blood Loss:  None/Negligible  Specimen(s) Removed/Disposition:  Yes, sent to pathology  Complications: None  Patient Tolerated Procedure: Well  Post-Procedure Condition:  stable    Marolyn Pickler, MD

## 2023-09-01 NOTE — Unmapped
 Patient/family declined:  Bed/chair alarm and W/in arms reach during toileting/showering.    Patient/family educated on importance of intervention to their safety/quality of care. Patient/family continues to decline care.    Reason Why Patient/Family declined:  Patient wishes to ambulate with assistance of family present at bedside. Both patient and family member agree to call for staff help if patient feels unsteady.     Individualized safety and/or care plan implemented. If additional safety measures implemented, please list them.     Escalated to:  Unit coordinator/charge nurse.

## 2023-09-01 NOTE — Progress Notes
 General Medicine Daily Progress Note    Name: Vicki Buchanan        MRN: [id number]          DOB: February 17, 1956            Age: 68 y.o.  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     Mucinous adenocarcinoma of the appendix  hyperbilirubinemia  malignant biliary obstruction  ascending cholangitis   post ERCP pancreatitis   - admitted with rising bili, leukocytosis, anemia  - oncology consulted, appreciate assistance  - recently hospitalized at [facility] with pus noted to be coming out of her bile ducts during ERCP 08/06/23  - biliary consulted, s/p ERCP 08/21/23: Uncovered metal stent coming out of the ampulla with double-pigtail plastic stent going through the lumen of the metal stent.  Food and stent debris seen in stent lumen.  Occlusion cholangiogram performed that showed narrowing in the midportion of the stent consistent with tumor/tissue ingrowth.  Right hepatic duct mildly dilated without any obvious stricture.  Left intrahepatic ducts normal-appearing.  A 10 mm x 8 cm viabil stent was deployed within the previously placed uncovered biliary stent with good outflow of contrast.   - CT A/P 6/5:   Development of small left pleural effusion and minimal basilar bronchiolitis.  No small bowel obstruction, ascites, or free air.  Redemonstration of extensive partially calcified peritoneal metastatic disease.   New small subcutaneous nodule right lateral pelvis which is probably a new metastasis.   Right ischial pressure ulcer.   - abdominal pain, elevated lipase post ERCP: post-procedure pancreatitis  Plan  - ID consulted, on unasyn . Duration to be determined by WBC and liver enzymes/bilirubin  > PTC ordered in IR to access smaller peripheral intrahepatic ducts and potentially help drain the left system.   -Status post placement of 8 French internal/external biliary drain via a dilated peripheral left duct.  Dilated left biliary tree. 6/14 upsized to 18 [place] biliary drain due to concern for malfunction  - Biliary drain should be placed to gravity drainage until bilirubin normalizes and flushed with 10 mL normal saline twice a day.  Patient follow-up in 3 months for routine preventive maintenance of biliary catheters.  - Daily labs to monitor     Leukocytosis  > discussed with ID who were consulted, but signed off. They requested a new consult on 6/10.  > Continue unasyn  for now      Anemia  > 1 unit prbcs ordered on 6/7.  > Hgb stable 6/14, monitor closely      Hypotension--improved   - Repeated rapid responses 6/5-6/6 for hypotension, received 5L IVF  - Echo 08/22/23: The left ventricular size is normal. The left ventricular wall thickness is normal. Normal geometry.  The left ventricular systolic function is normal. The ejection fraction by Simpson's biplane method is 61%. There are no segmental wall motion abnormalities. Normal left ventricular diastolic function.  The right ventricular size is normal. The right ventricular systolic  function is normal.  Nonspecific thickening of the mitral valve with mild to moderate regurgitation, no stenosis.  IVC was not visualized on the study.  No pericardial effusion.  - hold home hydralazine , carvedilol   - patient gets 1-2L IV fluids MWF as an outpatient, will need to watch her ins/outs   - Ordered 1 L of NS on 6/11     AKI on CKD4  chronic mild L hydronephrosis   - follows with Dr. Marlo as outpatient  Plan  > Continue IVF  > Nephrology following        GERD  - continue PPI  - add PRN GI cocktail      FEN:   Fluids: LR  Monitor and replace electrolytes as needed  Diet: cardiac   DVT PPX: heparin  SQ      CODE STATUS: FULL CODE   NOK: husband, updated at bedside today      Disposition:  continue admission        Malnutrition Details:  Malnutrition present  ICD-10 code E44: Acute illness/Moderate non-severe malnutrition  Mild loss of body fat, Mild loss of muscle mass, Energy Intake: Less than 75% of estimated energy requirement for greater than 7days            Loss of Subcutaneous Fat: Yes Mild Triceps  Muscle Wasting: Yes Mild Clavicle               Malnutrition Interventions: Encouraged small frequent meals, reviewed meal ideas, encouraged Ensure supplements      Wound:                   Brian Prime, MD  Hospitalist, Internal Medicine    Voalte is the preferred method of communication.   Please use the Med Private First Call for all patient-related communications. Personal Voaltes and pagers are not answered at all hours.      High medical decision making due to the following:  1 acute or chronic illness that poses a threat to life or bodily function  Review of notes outside of my specialty, Review of each unique test, and Ordering of each unique test and independent interpretation of a test (CBC, CMP)     _______________________________________________________________________    Subjective  Vicki Buchanan is a 68 y.o. female.  Seen at bedside with husband and sister present. She was sleeping after sedation and pain meds from IR procedure. We discussed IR procedure and plan to watch numbers over the next few days. Drain with decent output thus far. Otherwise no changes.     Medications  Scheduled Meds:ampicillin /sulbactam (UNASYN ) 3 g in sodium chloride  0.9% (NS) 100 mL IVPB (MB+), 3 g, Intravenous, Q12H*  ERGOcalciferoL  (vitamin D2) (DRISDOL ) capsule 50,000 Units, 50,000 Units, Oral, Q7 Days  heparin  (porcine) PF syringe 5,000 Units, 5,000 Units, Subcutaneous, Q8H  midazolam  (VERSED ) injection 1 mg, 1 mg, Intravenous, ONCE  pantoprazole  (PROTONIX ) injection 40 mg, 40 mg, Intravenous, API(88-78)  sodium chloride  0.9 % (flush) syringe 10 mL, 10 mL, Intra-catheter, BID  sodium chloride  0.9 % (flush) syringe 10 mL, 10 mL, Intra-catheter, BID    Continuous Infusions:  PRN and Respiratory Meds:acetaminophen  Q6H PRN, alteplase  BID PRN, alum-mag hydroxide-simeth Q4H PRN **AND** [EXPIRED] lidocaine  hcl viscous ONCE, calcium  carbonate Q6H PRN, cetirizine  QDAY PRN, diphenoxylate -atropine  QID PRN, fentaNYL  citrate PF Q3H while awake PRN, flumazeniL  PRN, hydrOXYzine  HCL Q6H PRN, loperamide  QID PRN, melatonin QHS PRN, nalOXone  PRN, ondansetron  Q6H PRN **OR** ondansetron  (ZOFRAN ) IV Q6H PRN, oxyCODONE  Q4H PRN, polyethylene  glycol 3350 QDAY PRN, sennosides-docusate sodium  QDAY PRN, trimethobenzamide  Q6H PRN        Objective:                          Vital Signs: Last Filed                 Vital Signs: 24 Hour Range   BP: 143/66 (06/14 0956)  Temp: 36.1 ?C (97 ?F) (06/14 0956)  Pulse: 71 (06/14 0956)  Respirations: 14 PER MINUTE (06/14 0956)  SpO2: 99 % (06/14 0956)  O2 Device: None (Room air) (06/14 0956)  O2 Liter Flow: 2 Lpm (06/14 0915) BP: (134-179)/(57-90)   Temp:  [36.1 ?C (97 ?F)-37 ?C (98.6 ?F)]   Pulse:  [62-93]   Respirations:  [11 PER MINUTE-27 PER MINUTE]   SpO2:  [96 %-100 %]   O2 Device: None (Room air)  O2 Liter Flow: 2 Lpm     Vitals:    08/24/23 0557 08/29/23 0856 08/31/23 0244   Weight: 58.7 kg (129 lb 6.4 oz) 58.7 kg (129 lb 6.4 oz) 61.3 kg (135 lb 3.2 oz)         Intake/Output Summary:  (Last 24 hours)    Intake/Output Summary (Last 24 hours) at 09/01/2023 1100  Last data filed at 09/01/2023 1009  Gross per 24 hour   Intake 30 ml   Output 1348 ml   Net -1318 ml           Physical Exam  Physical Exam  Vitals reviewed.   Constitutional:       Comments: sleeping post anesthesia   Pulmonary:      Effort: Pulmonary effort is normal.   Abdominal:      Comments: hepatic drain in place    Musculoskeletal:      Right lower leg: Edema present.      Left lower leg: Edema present.           Lab Review  Pertinent labs reviewed and noted in A/P.     Point of Care Testing  (Last 24 hours)  Glucose: (!) 124 (09/01/23 9791)    Radiology and other Diagnostics Review:    Pertinent radiology reviewed and noted in A/P.

## 2023-09-01 NOTE — Progress Notes
@   9075:  Pt is s/p biliary drain upsize in IR.  VSS on room air.  Pt has been sleeping while in recovery.  Report given to [facility] RN.  Orders to flush the drain placed.  IR RN updated pt's husband.  Pt will transport by bed from [facility] PR2 to CA11 w/ IR staff.

## 2023-09-01 NOTE — Unmapped
 Patient/family declined:  Bed/chair alarm, W/in arms reach during toileting/showering, and Ambulation.    Patient/family educated on importance of intervention to their safety/quality of care. Patient/family continues to decline care.    Reason Why Patient/Family declined:  Pt wishes to ambulate with family at bedside. Agrees to call out for assistance if she feels weak/dizzy. Bed is in lowest/locked position and call light is within reach.    Individualized safety and/or care plan implemented. If additional safety measures implemented, please list them.     Escalated to:  Unit coordinator/charge nurse.

## 2023-09-02 LAB — MANUAL DIFF: ~~LOC~~ BKR NEUTROPHILS - RELATIVE: 74 % (ref 41–77)

## 2023-09-02 LAB — CULTURE-WOUND/TISSUE/FLUID(AEROBIC ONLY)W/SENSITIVITY

## 2023-09-02 MED ORDER — SODIUM CHLORIDE 0.9 % TKO FLUID
INTRAVENOUS | 0 refills | Status: DC | PRN
Start: 2023-09-02 — End: 2023-09-08
  Administered 2023-09-02: 11:00:00 via INTRAVENOUS

## 2023-09-02 MED ORDER — LACTATED RINGERS IV BOLUS
2000 mL | Freq: Once | INTRAVENOUS | 0 refills | Status: CP
Start: 2023-09-02 — End: ?
  Administered 2023-09-02: 15:00:00 2000 mL via INTRAVENOUS

## 2023-09-02 MED ORDER — LACTATED RINGERS IV SOLP
Freq: Once | INTRAVENOUS | 0 refills | Status: DC
Start: 2023-09-02 — End: 2023-09-02

## 2023-09-02 MED ORDER — AMPICILLIN/SULBACTAM 3G/100ML NS IVPB (MB+)
3 g | INTRAVENOUS | 0 refills | Status: DC
Start: 2023-09-02 — End: 2023-09-08
  Administered 2023-09-03 – 2023-09-07 (×10): 3 g via INTRAVENOUS

## 2023-09-02 MED ADMIN — SODIUM CHLORIDE 0.9 % IV PGBK (MB+) [95161]: 100 mL | INTRAVENOUS | Stop: 2023-09-02 | NDC 00338915930

## 2023-09-02 NOTE — Unmapped
 Patient/family declined:  Turns, Bed/chair alarm, W/in arms reach during toileting/showering, and Ambulation.    Patient/family educated on importance of intervention to their safety/quality of care. Patient/family continues to decline care.    Reason Why Patient/Family declined:  The patient is refusing ambulation assistance from staff. The patient was educate on the importance of having help while ambulating. The patient verbalized understanding and will call press call light if she needs help from staff.    Individualized safety and/or care plan implemented. If additional safety measures implemented, please list them.     Escalated to:  Unit coordinator/charge nurse.

## 2023-09-02 NOTE — Progress Notes
 General Medicine Daily Progress Note    Name: Vicki Buchanan        MRN: [id number]          DOB: [birth year]            Age: 68 y.o.  Admission Date: 08/20/2023       LOS: 13 days    Date of Service: 09/02/2023    Assessment/Plan:    Principal Problem:    Cholangitis (CMS-HCC)  Active Problems:    Peritoneal carcinomatosis (CMS-HCC)    Cancer of appendix (CMS-HCC)    Hyperbilirubinemia    Moderate malnutrition      Ms. 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     Mucinous adenocarcinoma of the appendix  hyperbilirubinemia  malignant biliary obstruction  ascending cholangitis   post ERCP pancreatitis   - admitted with rising bili, leukocytosis, anemia  - oncology consulted, appreciate assistance  - recently hospitalized at [facility] with pus noted to be coming out of her bile ducts during ERCP 08/06/23  - biliary consulted, s/p ERCP 08/21/23: Uncovered metal stent coming out of the ampulla with double-pigtail plastic stent going through the lumen of the metal stent.  Food and stent debris seen in stent lumen.  Occlusion cholangiogram performed that showed narrowing in the midportion of the stent consistent with tumor/tissue ingrowth.  Right hepatic duct mildly dilated without any obvious stricture.  Left intrahepatic ducts normal-appearing.  A 10 mm x 8 cm viabil stent was deployed within the previously placed uncovered biliary stent with good outflow of contrast.   - CT A/P 6/5:   Development of small left pleural effusion and minimal basilar bronchiolitis.  No small bowel obstruction, ascites, or free air.  Redemonstration of extensive partially calcified peritoneal metastatic disease.   New small subcutaneous nodule right lateral pelvis which is probably a new metastasis.   Right ischial pressure ulcer.   - abdominal pain, elevated lipase post ERCP: post-procedure pancreatitis  Plan  - ID consulted, on unasyn . Duration to be determined by WBC and liver enzymes/bilirubin.   -Status post placement of 8 French internal/external biliary drain via a dilated peripheral left duct.  Dilated left biliary tree. 6/14 upsized to 18 [place] biliary drain due to concern for malfunction. Cutlures growing moderate budding yeast, rare neutrophils   - Biliary drain should be placed to gravity drainage until bilirubin normalizes and flushed with 10 mL normal saline twice a day.  Patient follow-up in 3 months for routine preventive maintenance of biliary catheters.  - Daily labs to monitor     Leukocytosis  > discussed with ID who were consulted, but signed off. They requested a new consult on 6/10.  > Continue unasyn  for now and monitor WBC. Slight bump 6/14 but may be due to procedure      Anemia  > 1 unit prbcs ordered on 6/7.  > Hgb stable 6/15, monitor closely      Hypotension--improved   - Repeated rapid responses 6/5-6/6 for hypotension, received 5L IVF  - Echo 08/22/23: The left ventricular size is normal. The left ventricular wall thickness is normal. Normal geometry.  The left ventricular systolic function is normal. The ejection fraction by Simpson's biplane method is 61%. There are no segmental wall motion abnormalities. Normal left ventricular diastolic function.  The right ventricular size is normal. The right ventricular  systolic function is normal.  Nonspecific thickening of the mitral valve with mild to moderate regurgitation, no stenosis.  IVC was not visualized on the study.  No pericardial effusion.  - hold home hydralazine , carvedilol   - patient gets 1-2L IV fluids MWF as an outpatient, will need to watch her ins/outs   - Ordered 2 L LR on 6/15.     AKI on CKD4  chronic mild L hydronephrosis   - follows with Dr. Marlo as outpatient  Plan  > Continue IVF for goal net even to slightly positive. 6/14 was net negative. Will give 2L LR slowly today   > Nephrology following        GERD  - continue PPI  - add PRN GI cocktail      FEN:   Fluids: LR  Monitor and replace electrolytes as needed  Diet: cardiac   DVT PPX: heparin  SQ      CODE STATUS: FULL CODE   NOK: husband, updated at bedside today      Disposition:  continue admission        Malnutrition Details:  Malnutrition present  ICD-10 code E44: Acute illness/Moderate non-severe malnutrition  Mild loss of body fat, Mild loss of muscle mass, Energy Intake: Less than 75% of estimated energy requirement for greater than 7days            Loss of Subcutaneous Fat: Yes Mild Triceps  Muscle Wasting: Yes Mild Clavicle               Malnutrition Interventions: Encouraged small frequent meals, reviewed meal ideas, encouraged Ensure supplements      Wound:                   Brian Prime, MD  Hospitalist, Internal Medicine    Voalte is the preferred method of communication.   Please use the Med Private First Call for all patient-related communications. Personal Voaltes and pagers are not answered at all hours.      High medical decision making due to the following:  1 acute or chronic illness that poses a threat to life or bodily function  Review of notes outside of my specialty, Review of each unique test, and Ordering of each unique test and independent interpretation of a test (CBC, CMP)     _______________________________________________________________________    Subjective  Vicki Buchanan is a 68 y.o. female.  Seen at bedside with husband present. Patient alert and interactive today, said she is feeling significantly better. We discussed IV fluids and lab results from today. She would like to take a walk later this afternoon. Tolerated breakfast without vomiting.     Medications  Scheduled Meds:ampicillin /sulbactam (UNASYN ) 3 g in sodium chloride  0.9% (NS) 100 mL IVPB (MB+), 3 g, Intravenous, Q12H*  ERGOcalciferoL  (vitamin D2) (DRISDOL ) capsule 50,000 Units, 50,000 Units, Oral, Q7 Days  heparin  (porcine) PF syringe 5,000 Units, 5,000 Units, Subcutaneous, Q8H  pantoprazole  (PROTONIX ) injection 40 mg, 40 mg, Intravenous, API(88-78)  sodium chloride  0.9 % (flush) syringe 10 mL, 10 mL, Intra-catheter, BID  sodium chloride  0.9 % (flush) syringe 10 mL, 10 mL, Intra-catheter, BID    Continuous Infusions:  PRN and Respiratory Meds:acetaminophen  Q6H PRN, alteplase  BID PRN, alum-mag hydroxide-simeth Q4H PRN **AND** [EXPIRED] lidocaine  hcl viscous ONCE, calcium  carbonate Q6H PRN, cetirizine  QDAY PRN, diphenoxylate -atropine  QID PRN, fentaNYL  citrate PF Q3H while awake PRN, flumazeniL  PRN, hydrOXYzine  HCL Q6H PRN, loperamide  QID PRN, melatonin QHS PRN, nalOXone  PRN, ondansetron  Q6H PRN **OR** ondansetron  (ZOFRAN ) IV  Q6H PRN, oxyCODONE  Q4H PRN, polyethylene glycol 3350  QDAY PRN, sennosides-docusate sodium  QDAY PRN, sodium chloride  0.9% TKO infusion PRN, trimethobenzamide  Q6H PRN        Objective:                          Vital Signs: Last Filed                 Vital Signs: 24 Hour Range   BP: 127/63 (06/15 0737)  Temp: 36.6 ?C (97.9 ?F) (06/15 9262)  Pulse: 66 (06/15 0737)  Respirations: 17 PER MINUTE (06/15 0737)  SpO2: 96 % (06/15 0737)  O2 Device: None (Room air) (06/15 0737)  O2 Liter Flow: 2 Lpm (06/14 0915) BP: (127-143)/(55-77)   Temp:  [36.1 ?C (97 ?F)-37.1 ?C (98.7 ?F)]   Pulse:  [66-80]   Respirations:  [13 PER MINUTE-17 PER MINUTE]   SpO2:  [94 %-100 %]   O2 Device: None (Room air)  O2 Liter Flow: 2 Lpm   Intensity Pain Scale (Self Report): 3 (09/01/23 2123) Vitals:    08/29/23 0856 08/31/23 0244 09/02/23 0500   Weight: 58.7 kg (129 lb 6.4 oz) 61.3 kg (135 lb 3.2 oz) 61.6 kg (135 lb 12.8 oz)         Intake/Output Summary:  (Last 24 hours)    Intake/Output Summary (Last 24 hours) at 09/02/2023 0846  Last data filed at 09/02/2023 0803  Gross per 24 hour   Intake 960 ml   Output 2070 ml   Net -1110 ml           Physical Exam  Physical Exam  Vitals reviewed.   Pulmonary: Effort: Pulmonary effort is normal.   Abdominal:      Comments: hepatic drain in place    Musculoskeletal:      Right lower leg: Edema present.      Left lower leg: Edema present.   Skin:     Coloration: Skin is jaundiced.   Neurological:      Mental Status: She is alert.           Lab Review  Pertinent labs reviewed and noted in A/P.     Point of Care Testing  (Last 24 hours)  Glucose: 87 (09/02/23 0354)    Radiology and other Diagnostics Review:    Pertinent radiology reviewed and noted in A/P.

## 2023-09-03 ENCOUNTER — Inpatient Hospital Stay: Admit: 2023-09-03 | Discharge: 2023-09-03 | Payer: MEDICARE

## 2023-09-03 ENCOUNTER — Encounter: Admit: 2023-09-03 | Discharge: 2023-09-03 | Payer: MEDICARE

## 2023-09-03 LAB — FLUID CELL PATH REVIEW

## 2023-09-03 MED ORDER — LACTATED RINGERS IV SOLP
Freq: Once | INTRAVENOUS | 0 refills | Status: CP
Start: 2023-09-03 — End: ?
  Administered 2023-09-03: 19:00:00 1000.0000 mL via INTRAVENOUS

## 2023-09-03 NOTE — Progress Notes
 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  Obstructive biliary tree; slender left intrahepatic ducts; tumor ingrowth into midportion of metal stent; LHD opacified with difficulty  08/03/2023 admitted to [facility] with acute cholangitis  Started Zosyn   08/04/2023 urine culture: >100K CFU Enterococcus faecalis (S) ampicillin   08/06/2023 underwent ERCP which revealed indwelling uncovered metal biliary stent in the left hepatic duct which was exchanged with plastic stent, mild stenosis within the metal stent at the proximal main bile duct, diffuse malignant stenoses of intrahepatic ducts; presence of purulence  08/07/2023 discontinued Zosyn ; started Augmentin ; discharged home  08/16/2023 underwent ERCP which revealed a protruding indwelling metallic stent in CBD seen coming out from ampulla with a plastic stent inside the metal stent, plastic stent was removed, occlusion cholangiogram: right posterior intrahepatic duct opacified; left hepatic system not opacified and totally; right anterior intrahepatic with branches partially opacified with significant narrowing beyond indwelling stent up to the hilum of liver; palced a Habiba catheter size 8 French, 2.7 and RFA x 2; large amount of tumor ingrowth inside the indwelling stent with large amount of tissue growing at the entrance of the stent which again this area also tumor destruction applied using RFA x 2; placed a 10 French, 10 cm double-pigtail plastic stent was successfully placed into the right intrahepatic duct; good outflow of bile and contrast seen through both the stents  08/20/2023 seen in oncology clinic [facility], noted to have persistent jaundice status post ERP and stent placement, received cefepime  x 1 dose  08/20/23: Directly admitted to the hospitalist service  Blood cultures: No growth x 2 sets  Urine culture: No growth  Started Zosyn   08/21/23 ERCP: uncovered metal stent in CBD extending out of ampulla with a double-pigtail plastic stent through the lumen of the uncovered metal stent, double-pigtail plastic stent removed, stone extraction balloon passed into left intrahepatic duct, balloon sweeps with extraction of a large amount of sludge, food debris and stent debris;  occlusion cholangiogram: Narrowing within the midportion of the stent consistent with tumor/tissue ingrowth.  RHD mildly dilated without stricture.  LHD opacified with difficulty.  Left intrahepatic ducts were slender and not well-visualized.  Palced 10 mm x 8 cm VIABIL stent within the previously placed uncovered biliary stent, upper end of the VIABIL stent was below the hilar confluence, good outflow of contrast after stent deployment   08/23/2023 CT ABD/pelvis: Development of small left pleural effusion and minimal basilar   bronchiolitis. No small bowel obstruction, ascites, or free air. Redemonstration of extensive partially calcified peritoneal metastatic disease. New small subcutaneous nodule right lateral pelvis which is probably a new metastasis. Right ischial pressure ulcer.   08/28/2023 IR: Placement of internal/external biliary drain (8 [place]) into the left ductal system  No meaningful drain output; progressive leukocytosis  08/30/2023 IR: Upsizing of internal/external biliary drain (8 Fr -> 12 Fr) within the left ductal system.  Some particulate matter identified on cholangiogram  - 6/12 biliary fluid GS: Moderate PMN, few budding yeast, few GPC in singles/pairs; routine culture + coag-negative Staphylococcus, anaerobic culture + Veillonella parvula  6/14 IR: Persistent biliary ductal dilation. Successful upsize to 78fr drain. 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 with subsequent removal of ureteral stents  02/2017 diagnosed on laparoscopy  04/03/2017 underwent loop ileostomy creation  12/2017 started epacadosta and sirolimus   05/2018 discontinued epacadosta and sirolimus   05/27/2020 underwent laparoscopic HIPEC with mitomycin-C (trial at [facility])  10/07/2018 underwent second laparoscopic HIPEC  09/12/2018 underwent tumor debulking [facility])   05/2020 underwent radiation therapy to right lung metastases  04/16/2020 underwent cytoreductive surgery with ostomy revision due to tumor invasion  09/2020 started FOLFIRI and bevacizumab    09/07/2021 MRI/MRCP: Slight apparent increase in diffuse intrahepatic and extrahepatic biliary ductal dilatation to the level of the porta hepatis where there is   extrinsic compression on the extrahepatic common duct from metastases. No   biliary filling defect to suggest choledocholithiasis or biliary mass. Grossly unchanged extensive peritoneal carcinomatosis and right abdominal wall metastases. Partially visualized indeterminate lesion in the left sacrum, which may   be a metastasis or hemangioma.   09/16/2021 underwent ERCP this month of an uncovered metal stent in the CBD  09/2021 held bevacizumab    08/04/2023 CT ABD/pelvis: Extensive metastatic disease throughout the abdomen and pelvis including peritoneal implants with mass effect on adjacent structures. Left posterior pleural implant with invasion of the chest wall and adjacent smaller nodules. Common bile duct and biliary stent in place. Moderate to intrahepatic biliary ductal dilatation. Right lower quadrant ostomy encased with implants. No gas-filled dilated loops of bowel to suggest obstruction. Mild left hydronephrosis with prominent left extrarenal pelvis. Diffuse bladder wall thickening with adjacent fat stranding, possibly secondary to acute cystitis. Correlation with urinalysis is recommended. Distended vaginal cuff with air-fluid level. The posterior wall of the urinary bladder is adjacent to the vaginal cuff without preserved fat plane of separation.   08/20/2023 CEA 2565.5  Currently FOLFIRI cycle 40     CKD, stage IV     Recurrent urinary tract infections     Pelvic abscess following tumor debulking  10/2018 abscess cultures: Serratia and Candida albicans  Underwent IR abscess drain placement  Received course of cefepime  and fluconazole      Asplenia, surgical  02/2018 Pneumovax   09/2018 PCV13 (Prevnar)   09/2018 Haemophilus influenzae type B   Meningococcal conjugate vaccine (Menveo) 09/30/2018 and 11/2018  Meningococcal serogroup B vaccine (MenB) 09/30/2018 and 10/28/2018    Recommendations:     Recent ERCPs confirmed further tumor growth with some obstruction of the existing metal stent within the CBD as well as a partially obstructed left hepatic ductal system, as evidenced on the cholangiogram.    Over the last several days, patient has not had fever but has developed progressive leukocytosis and had persistent elevation of hepatic transaminases, alkaline phosphatase and total bilirubin.  08/28/2023, underwent placement of internal/external biliary drain into the left hepatic ductal system to decompress.  However, effectively no output from this new enteral/external biliary drain.  Drain was upsized 6/12 from 8Fr -> 12Fr catheter.    Throughout much of the day, little to no output.  With aggressive aspiration and flushing by IR today, thin bilious fluid is now flowing.    Please continue ampicillin /sulbactam for now.  Even though the white blood cell count has increased, the patient remains afebrile and stable.  Duration of antibiotic therapy to be determined based on adequate decompression of the biliary tree  and improvement of leukocytosis.  Consider obtaining left lower extremity US /Doppler to rule out DVT.  Monitor temp and WBC count.    We'll follow.    Interval Hx     Patient afebrile  Hemodynamically stable  On room air    Doing relatively well  Denies having fever, chills or sweating  No shortness of breath or cough  Had nausea and vomiting last night  No nausea today; she was able to eat breakfast without problem  No dysuria or hematuria  Reports having pain with movements in the left calf    Ileostomy output in the past 24 hours: 550 mL      WBC 29.4  Hemoglobin 7.4  Platelets 147  Creatinine 3.46  AST 70  ALT 52  ALP 238  T. bili 15.4    Drain output over previous 24 hours:  Left internal/external biliary drain: 230 mL       Antimicrobial Start date End date   Cefepime  6/2 6/2   Pip/tazo 6/2 6/6   Amp/sulb 6/11 Active                       Estimated Creatinine Clearance: 13.5 mL/min (A) (based on SCr of 3.45 mg/dL (H)).    Medications   Scheduled Meds:ampicillin /sulbactam (UNASYN ) 3 g in sodium chloride  0.9% (NS) 100 mL IVPB (MB+), 3 g, Intravenous, Q24H*  ERGOcalciferoL  (vitamin D2) (DRISDOL ) capsule 50,000 Units, 50,000 Units, Oral, Q7 Days  heparin  (porcine) PF syringe 5,000 Units, 5,000 Units, Subcutaneous, Q8H  pantoprazole  (PROTONIX ) injection 40 mg, 40 mg, Intravenous, API(88-78)  sodium chloride  0.9 % (flush) syringe 10 mL, 10 mL, Intra-catheter, BID  sodium chloride  0.9 % (flush) syringe 10 mL, 10 mL, Intra-catheter, BID    Continuous Infusions:      PRN and Respiratory Meds:acetaminophen  Q6H PRN, alteplase  BID PRN, alum-mag hydroxide-simeth Q4H PRN **AND** [EXPIRED] lidocaine  hcl viscous ONCE, calcium  carbonate Q6H PRN, cetirizine  QDAY PRN, diphenoxylate -atropine  QID PRN, fentaNYL  citrate PF Q3H while awake PRN, flumazeniL  PRN, hydrOXYzine  HCL Q6H PRN, loperamide  QID PRN, melatonin QHS PRN, nalOXone  PRN, ondansetron  Q6H PRN **OR** ondansetron  (ZOFRAN ) IV Q6H PRN, oxyCODONE  Q4H PRN, polyethylene glycol 3350  QDAY PRN, sennosides-docusate sodium  QDAY PRN, sodium chloride  0.9% TKO infusion PRN, trimethobenzamide  Q6H PRN      Allergies     Allergies   Allergen Reactions    Indomethacin  SEE COMMENTS     Pain for several days after suppository post ERCP. Tolerated IV but not PR    Shrimp SEE COMMENTS     Eye irritation. Namely blood vessel issues in the eyes        Physical Examination                          Vital Signs: Last                  Vital Signs: 24 Hour Range   BP: 137/67 (06/16 0306)  Temp: 37 ?C (98.6 ?F) (06/16 9693)  Pulse: 76 (06/16 0306)  Respirations: 16 PER MINUTE (06/16 0306)  SpO2: 98 % (06/16 0306)  O2 Device: None (Room air) (06/16 0306) BP: (119-137)/(53-72)   Temp:  [36.6 ?C (97.9 ?F)-37 ?C (98.6 ?F)]   Pulse:  [63-77]   Respirations:  [15 PER MINUTE-17 PER MINUTE]   SpO2:  [96 %-99 %]   O2 Device: None (Room air)     Gen: Awake, alert, NAD, fatigued appearing  HEENT: EOMI,  scleral icterus present, no visible thrush  Lungs: Clear breath sounds bilaterally  Heart: Regular, no murmur  Abd: + BS, soft, no tenderness to palpation, moderately distended, left internal/external biliary drain draining bilious fluid a lot of sediment in the bag, ostomy intact without surrounding tenderness  Ext: 1+ bilateral lower extremity edema, tenderness to palpation of the left calf  Skin: Jaundice, no acute rash  Musculoskel: No warm or swollen joints  Psych: Oriented x 3 with normal mood and affect    Lines: Port-A-cath accessed, unremarkable aside    Drains/tubes:  Ileostomy RLQ  Left int/ext biliary drain     Lab Review   Hematology  Recent Labs     09/01/23  0208 09/02/23  0354 09/03/23  0300   WBC 17.20* 24.50* 29.40*   HGB 7.5* 7.1* 7.4*   HCT 21.5* 20.1* 21.5*   PLTCT 154 152 147*   INR 1.2 1.3* 1.3*     Chemistry  Recent Labs     09/01/23  0208 09/02/23  0354 09/03/23  0300   NA 138 137 136*   K 3.3* 4.3 4.3   CL 103 104 101   CO2 25 25 26    BUN 30* 30* 31*   CR 3.16* 3.26* 3.45*   GFR 15* 15* 14*   GLU 124* 87 78   CA 7.7* 7.7* 7.7*   PO4 2.7 2.9 3.3   ALBUMIN  2.0* 1.9* 2.0*   ALKPHOS 266* 273* 238*   AST 87* 79* 70*   ALT 67* 59* 52   TOTBILI 17.6* 16.4* 15.4*       Microbiology, Radiology and other Diagnostics Review   Microbiology data reviewed.    Pertinent radiology images viewed.       Clem JINNY Sheen, MD   Contact via [facility]  Transplant & Immunocompromised Infectious Diseases

## 2023-09-03 NOTE — Progress Notes
 General Medicine Daily Progress Note    Name: Vicki Buchanan        MRN: [id number]          DOB: 1955-11-05            Age: 68 y.o.  Admission Date: 08/20/2023       LOS: 14 days    Date of Service: 09/03/2023    Assessment/Plan:    Principal Problem:    Cholangitis (CMS-HCC)  Active Problems:    Peritoneal carcinomatosis (CMS-HCC)    Cancer of appendix (CMS-HCC)    Hyperbilirubinemia    Moderate malnutrition      Vicki Buchanan is a 50yoF with metastatic mucinous adenocarcinoma of the appendix (peritoneal mets, TAH/BSO, diverting loop ileostomy, malignant biliary obstruction) who presented 08/20/23 with anemia, hyperbilirubinemia, and leukocytosis and was admitted with cholangitis.     Mucinous adenocarcinoma of the appendix  hyperbilirubinemia  malignant biliary obstruction  ascending cholangitis   post ERCP pancreatitis   - admitted with rising bili, leukocytosis, anemia  - oncology consulted, appreciate assistance  - recently hospitalized at [facility] with pus noted to be coming out of her bile ducts during ERCP 08/06/23  - biliary consulted, s/p ERCP 08/21/23: Uncovered metal stent coming out of the ampulla with double-pigtail plastic stent going through the lumen of the metal stent.  Food and stent debris seen in stent lumen.  Occlusion cholangiogram performed that showed narrowing in the midportion of the stent consistent with tumor/tissue ingrowth.  Right hepatic duct mildly dilated without any obvious stricture.  Left intrahepatic ducts normal-appearing.  A 10 mm x 8 cm viabil stent was deployed within the previously placed uncovered biliary stent with good outflow of contrast.   - CT A/P 6/5:   Development of small left pleural effusion and minimal basilar bronchiolitis.  No small bowel obstruction, ascites, or free air.  Redemonstration of extensive partially calcified peritoneal metastatic disease.   New small subcutaneous nodule right lateral pelvis which is probably a new metastasis.   Right ischial pressure ulcer.   - abdominal pain, elevated lipase post ERCP: post-procedure pancreatitis  Plan  - ID consulted, on unasyn . Duration to be determined by WBC and liver enzymes/bilirubin. 6/16 continue for now despite increase in WBC, clinically improving   -Status post placement of 8 French internal/external biliary drain via a dilated peripheral left duct.  Dilated left biliary tree. 6/14 upsized to 18 [place] biliary drain due to concern for malfunction. Cutlures growing moderate budding yeast, rare neutrophils   - Biliary drain should be placed to gravity drainage until bilirubin normalizes and flushed with 10 mL normal saline twice a day.  Patient follow-up in 3 months for routine preventive maintenance of biliary catheters.  - Daily labs to monitor     Leukocytosis  > Continue unasyn  for now and monitor WBC     Anemia  > 1 unit prbcs ordered on 6/7.  > Hgb stable 6/16, monitor closely      Hypotension--improved   - Repeated rapid responses 6/5-6/6 for hypotension, received 5L IVF  - Echo 08/22/23: The left ventricular size is normal. The left ventricular wall thickness is normal. Normal geometry.  The left ventricular systolic function is normal. The ejection fraction by Simpson's biplane method is 61%. There are no segmental wall motion abnormalities. Normal left ventricular diastolic function.  The right ventricular size is normal. The right ventricular systolic function is normal.  Nonspecific thickening of the mitral valve with mild to moderate regurgitation, no stenosis.  IVC was not visualized on the study.  No pericardial effusion.  - hold home hydralazine , carvedilol   - patient gets 1-2L IV fluids MWF as an outpatient, will need to watch her ins/outs   - Ordered 2 L LR on 6/15.     AKI on CKD4  chronic mild L hydronephrosis   - follows with Dr. Marlo as outpatient  Plan  > Continue IVF for goal net even to slightly positive. 6/16 give 1L IVF  > Nephrology following     LLE pain  > LLE doppler to rule out DVT      GERD  - continue PPI  - add PRN GI cocktail      FEN:   Fluids: LR  Monitor and replace electrolytes as needed  Diet: cardiac   DVT PPX: heparin  SQ      CODE STATUS: FULL CODE   NOK: husband, updated at bedside today      Disposition:  continue admission        Malnutrition Details:  Malnutrition present  ICD-10 code E44: Acute illness/Moderate non-severe malnutrition  Mild loss of body fat, Mild loss of muscle mass, Energy Intake: Less than 75% of estimated energy requirement for greater than 7days            Loss of Subcutaneous Fat: Yes Mild Triceps  Muscle Wasting: Yes Mild Clavicle               Malnutrition Interventions: Encouraged small frequent meals, reviewed meal ideas, encouraged Ensure supplements      Wound:                   Brian Prime, MD  Hospitalist, Internal Medicine    Voalte is the preferred method of communication.   Please use the Med Private First Call for all patient-related communications. Personal Voaltes and pagers are not answered at all hours.      High medical decision making due to the following:  1 acute or chronic illness that poses a threat to life or bodily function  Review of notes outside of my specialty, Review of each unique test, and Ordering of each unique test and independent interpretation of a test (CBC, CMP)     _______________________________________________________________________    Subjective  Vicki Buchanan is a 68 y.o. female.  Seen at bedside, husband and sister present. Felt tired yesterday but looking forward to taking a walk today. Overall feels better today and ate what is a normal breakfast for her. Noticed some L calf pain. Drain with adequate output. No abdominal pain.     Medications  Scheduled Meds:ampicillin /sulbactam (UNASYN ) 3 g in sodium chloride  0.9% (NS) 100 mL IVPB (MB+), 3 g, Intravenous, Q24H*  ERGOcalciferoL  (vitamin D2) (DRISDOL ) capsule 50,000 Units, 50,000 Units, Oral, Q7 Days  heparin  (porcine) PF syringe 5,000 Units, 5,000 Units, Subcutaneous, Q8H  pantoprazole  (PROTONIX ) injection 40 mg, 40 mg, Intravenous, API(88-78)  sodium chloride  0.9 % (flush) syringe 10 mL, 10 mL, Intra-catheter, BID  sodium chloride  0.9 % (flush) syringe 10 mL, 10 mL, Intra-catheter, BID    Continuous Infusions:  PRN and Respiratory Meds:acetaminophen  Q6H PRN, alteplase  BID PRN, alum-mag hydroxide-simeth Q4H PRN **AND** [EXPIRED] lidocaine  hcl viscous ONCE, calcium  carbonate Q6H PRN, cetirizine  QDAY PRN, diphenoxylate -atropine  QID PRN, fentaNYL  citrate PF Q3H while awake PRN, flumazeniL  PRN, hydrOXYzine  HCL Q6H PRN, loperamide  QID PRN, melatonin QHS PRN, nalOXone  PRN, ondansetron  Q6H PRN **OR** ondansetron  (ZOFRAN ) IV Q6H PRN, oxyCODONE  Q4H PRN, polyethylene glycol 3350  QDAY PRN, sennosides-docusate  sodium QDAY PRN, sodium chloride  0.9% TKO infusion PRN, trimethobenzamide  Q6H PRN        Objective:                          Vital Signs: Last Filed                 Vital Signs: 24 Hour Range   BP: 120/60 (06/16 0840)  Temp: 36.6 ?C (97.9 ?F) (06/16 0840)  Pulse: 84 (06/16 0840)  Respirations: 16 PER MINUTE (06/16 0840)  SpO2: 99 % (06/16 0840)  O2 Device: None (Room air) (06/16 0840) BP: (119-137)/(53-72)   Temp:  [36.6 ?C (97.9 ?F)-37 ?C (98.6 ?F)]   Pulse:  [63-84]   Respirations:  [15 PER MINUTE-16 PER MINUTE]   SpO2:  [97 %-99 %]   O2 Device: None (Room air)     Vitals:    08/29/23 0856 08/31/23 0244 09/02/23 0500   Weight: 58.7 kg (129 lb 6.4 oz) 61.3 kg (135 lb 3.2 oz) 61.6 kg (135 lb 12.8 oz)         Intake/Output Summary:  (Last 24 hours)    Intake/Output Summary (Last 24 hours) at 09/03/2023 0922  Last data filed at 09/03/2023 0841  Gross per 24 hour   Intake 720 ml   Output 1530 ml   Net -810 ml           Physical Exam  Physical Exam  Vitals reviewed.   Pulmonary:      Effort: Pulmonary effort is normal.   Abdominal:      Comments: hepatic drain in place    Musculoskeletal:      Right lower leg: Edema present.      Left lower leg: Edema present.   Skin:     Coloration: Skin is jaundiced.   Neurological:      Mental Status: She is alert.           Lab Review  Pertinent labs reviewed and noted in A/P.     Point of Care Testing  (Last 24 hours)  Glucose: 78 (09/03/23 0300)    Radiology and other Diagnostics Review:    Pertinent radiology reviewed and noted in A/P.

## 2023-09-03 NOTE — Unmapped
 Patient/family declined:  Bed/chair alarm and W/in arms reach during toileting/showering.    Patient/family educated on importance of intervention to their safety/quality of care. Patient/family continues to decline care.    Reason Why Patient/Family declined:  pt wishes to ambulate with assistance from family. Pt educated and pt and family agree to call staff if feeling weak or needing assistance.      Individualized safety and/or care plan implemented. If additional safety measures implemented, please list them.     Escalated to:  Unit coordinator/charge nurse.

## 2023-09-03 NOTE — Progress Notes
 PHYSICAL THERAPY  NOTE      Name: Vicki Buchanan   MRN: [id number]     DOB: January 19, 1956      Age: 68 y.o.  Admission Date: 08/20/2023     LOS: 14 days     Date of Service: 09/03/2023    Attempted to work with patient this morning, patient politely declined therapy and wished to continue watching TV at this time. Patient reports that she has been ambulating within the room with family. Wishes for PT to follow up on Wednesday for a progress check. Will follow up as indicated.     Therapist: Ai-Nhi Vivian Izick Gasbarro, SPT  Date: 09/03/2023

## 2023-09-04 LAB — COMPREHENSIVE METABOLIC PANEL
~~LOC~~ BKR ALBUMIN: 1.9 g/dL — ABNORMAL LOW (ref 3.5–5.0)
~~LOC~~ BKR ALK PHOSPHATASE: 214 U/L — ABNORMAL HIGH (ref 25–110)
~~LOC~~ BKR TOTAL BILIRUBIN: 13 mg/dL — ABNORMAL HIGH (ref 0.2–1.3)

## 2023-09-04 LAB — MANUAL DIFF
~~LOC~~ BKR BASOPHILS - RELATIVE: 1 % (ref 0–2)
~~LOC~~ BKR EOSINOPHILS - RELATIVE: 4 % (ref 0–5)
~~LOC~~ BKR LYMPHOCYTES - RELATIVE: 2 % — ABNORMAL LOW (ref 24–44)
~~LOC~~ BKR METAMYELOCYTES - RELATIVE: 1 %
~~LOC~~ BKR MONOCYTES - RELATIVE: 6 % (ref 4–12)
~~LOC~~ BKR NEUT+BANDS - ABSOLUTE: 18 10*3/uL — ABNORMAL HIGH (ref 1.8–7.0)
~~LOC~~ BKR NEUTROPHILS - RELATIVE: 86 % — ABNORMAL HIGH (ref 41–77)

## 2023-09-04 LAB — CULTURE-WOUND/TISSUE/FLUID(AEROBIC ONLY)W/SENSITIVITY

## 2023-09-04 LAB — CULTURE-ANAEROBIC

## 2023-09-04 MED ORDER — LACTATED RINGERS IV SOLP
Freq: Once | INTRAVENOUS | 0 refills | Status: DC
Start: 2023-09-04 — End: 2023-09-04

## 2023-09-04 MED ORDER — LACTATED RINGERS IV SOLP
INTRAVENOUS | 0 refills | Status: AC
Start: 2023-09-04 — End: ?
  Administered 2023-09-04 – 2023-09-05 (×2): 1000.0000 mL via INTRAVENOUS

## 2023-09-04 MED ORDER — GLYCERIN (ADULT) RE SUPP
1 | Freq: Once | RECTAL | 0 refills | Status: CP
Start: 2023-09-04 — End: ?
  Administered 2023-09-05: 05:00:00 1 via RECTAL

## 2023-09-04 NOTE — Case Management (ED)
 Case Management Progress Note    NAME:Vicki Buchanan                          MRN: [id number]              DOB:[birth year]          AGE: 68 y.o.  ADMISSION DATE: 08/20/2023             DAYS ADMITTED: LOS: 15 days      Today's Date: 09/04/2023    PLAN: Discharge planning ongoing. Anticipate patient to return home once stable with resumption of home infusion for IV fluids through [facility], likely over the weekend.    Expected Discharge Date: 09/07/2023   Is Patient Medically Stable: No, Please explain: awaiting final culture results and improvement in WBCs and bilirubin.  Are there Barriers to Discharge? No    INTERVENTION/DISPOSITION:  Discharge Planning              Discharge Planning: Home Infusion-[facility], Outpatient Rehabilitation  NCM reviewed EMR.  NCM attended and participated in MPP huddle.  Patient not stable for discharge today. Awaiting improvement in WBCs and bilirubin. Anticipate patient to discharge home over the weekend with resumption of home infusion for twice weekly IV fluid infusion through [facility] (Phone: [phone]; Fax: [phone]). NCM provided update to [facility] liaison Kat on discharge plan of care. NCM to send ROC orders to [facility] prior to discharge.  PT/OT following and recommending outpatient therapy upon discharge.  NCM will continue to follow for discharge planning/needs.  Transportation              Does the Patient Need Case Management to Arrange Discharge Transport? (ex: facility, ambulance, wheelchair/stretcher, Medicaid, cab, other): No  Will the Patient Use Family Transport?: Yes  Transportation Name, Phone and Availability #1: Husband [name] [phone]  Support              Support: Pt/Family Updates re:POC or DC Plan, Patient Education, Huddle/team update  Info or Referral              Information or Referral to Community Resources: No Needs Identified  Positive SDOH Domains and Potential Barriers      Medication Needs Medication Needs: No Needs Identified  Financial              Financial: No Needs Identified  Legal              Legal: No Needs Identified  Other              Other/None: No needs identified  Discharge Disposition                                        Selected Continued Care - Admitted Since 08/20/2023       [facility] Coordination complete.      Service Provider Services Address Phone Fax Patient Preferred    [facility] [address] [place] [address] [phone] --                  Larraine Blumenthal, BSN, RN  Med Private R Nurse Case Manager  [phone] and available on Voalte

## 2023-09-04 NOTE — Unmapped
 Patient/family declined:  Bed/chair alarm and W/in arms reach during toileting/showering.    Patient/family educated on importance of intervention to their safety/quality of care. Patient/family continues to decline care.    Reason Why Patient/Family declined:  Patient wants to ambulate with the help of family rather than nursing staff. Patient agreed to call staff if feeling weak/dizzy, or needing any other help.     Individualized safety and/or care plan implemented. If additional safety measures implemented, please list them.     Escalated to:  Unit coordinator/charge nurse.

## 2023-09-04 NOTE — Progress Notes
 General Medicine Daily Progress Note    Name: Vicki Buchanan        MRN: [id number]          DOB: [birth year]            Age: 68 y.o.  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     Mucinous adenocarcinoma of the appendix  hyperbilirubinemia  malignant biliary obstruction  ascending cholangitis   post ERCP pancreatitis   - admitted with rising bili, leukocytosis, anemia  - oncology consulted, appreciate assistance  - recently hospitalized at [facility] with pus noted to be coming out of her bile ducts during ERCP 08/06/23  - biliary consulted, s/p ERCP 08/21/23: Uncovered metal stent coming out of the ampulla with double-pigtail plastic stent going through the lumen of the metal stent.  Food and stent debris seen in stent lumen.  Occlusion cholangiogram performed that showed narrowing in the midportion of the stent consistent with tumor/tissue ingrowth.  Right hepatic duct mildly dilated without any obvious stricture.  Left intrahepatic ducts normal-appearing.  A 10 mm x 8 cm viabil stent was deployed within the previously placed uncovered biliary stent with good outflow of contrast.   - CT A/P 6/5:   Development of small left pleural effusion and minimal basilar bronchiolitis.  No small bowel obstruction, ascites, or free air.  Redemonstration of extensive partially calcified peritoneal metastatic disease.   New small subcutaneous nodule right lateral pelvis which is probably a new metastasis.   Right ischial pressure ulcer.   - abdominal pain, elevated lipase post ERCP: post-procedure pancreatitis  Plan  - ID consulted, on unasyn . Duration to be determined by WBC and liver enzymes/bilirubin. 6/17 continue for now, WBC improving  -Status post placement of 8 French internal/external biliary drain via a dilated peripheral left duct.  Dilated left biliary tree. 6/14 upsized to 18 [place] biliary drain due to concern for malfunction. Cutlures growing moderate budding yeast, rare neutrophils, coag neg staph   - Biliary drain should be placed to gravity drainage until bilirubin normalizes and flushed with 10 mL normal saline twice a day.  Patient follow-up in 3 months for routine preventive maintenance of biliary catheters.  - Daily labs to monitor     Leukocytosis  > Continue unasyn  for now and monitor WBC     Anemia  > 1 unit prbcs ordered on 6/7.  > 6/17 Hgb dropped to 6.9, 1 unit ordered.      AKI on CKD4  chronic mild L hydronephrosis   - follows with Dr. Marlo as outpatient  Plan  > Continue IVF for goal net even to slightly positive. 6/17 give 2L IVF  > Nephrology following     LLE pain  > LLE doppler neg for DVT     GERD  - continue PPI  - add PRN GI cocktail      FEN:   Fluids: LR  Monitor and replace electrolytes as needed  Diet: cardiac  DVT PPX: heparin  SQ held with drop in hgb      CODE STATUS: FULL CODE   NOK: husband     Disposition:  continue admission        Malnutrition Details:  Malnutrition present  ICD-10 code E44: Acute illness/Moderate non-severe malnutrition  Mild loss of body fat, Mild loss of muscle mass, Energy Intake: Less than 75% of estimated energy requirement for greater than 7days            Loss of Subcutaneous Fat: Yes Mild Triceps  Muscle Wasting: Yes Mild Clavicle               Malnutrition Interventions: Encouraged small frequent meals, reviewed meal ideas, encouraged Ensure supplements      Wound:                   Brian Prime, MD  Hospitalist, Internal Medicine    Voalte is the preferred method of communication.   Please use the Med Private First Call for all patient-related communications. Personal Voaltes and pagers are not answered at all hours.      High medical decision making due to the following:  independent interpretation of a test (CBC, CMP)  and discussion of management or test interpretation with nephrology (physician(s) or other qualified health care professional outside of my specialty)  drug therapy requiring intensive monitoring for toxicity(IV abx)        _______________________________________________________________________    Subjective  Vicki Buchanan is a 68 y.o. female.  Seen at bedside, sister present. Has been walking about her room, trying to prepare for amount she will need to walk at home. Drain continues to have good output. No blood in ostomy or urine. Tolerating diet without nausea. We reviewed today's labs and plan for fluids. No new concerns otherwise.    Medications  Scheduled Meds:ampicillin /sulbactam (UNASYN ) 3 g in sodium chloride  0.9% (NS) 100 mL IVPB (MB+), 3 g, Intravenous, Q24H*  ERGOcalciferoL  (vitamin D2) (DRISDOL ) capsule 50,000 Units, 50,000 Units, Oral, Q7 Days  heparin  (porcine) PF syringe 5,000 Units, 5,000 Units, Subcutaneous, Q8H  pantoprazole  (PROTONIX ) injection 40 mg, 40 mg, Intravenous, API(88-78)  sodium chloride  0.9 % (flush) syringe 10 mL, 10 mL, Intra-catheter, BID  sodium chloride  0.9 % (flush) syringe 10 mL, 10 mL, Intra-catheter, BID    Continuous Infusions:  PRN and Respiratory Meds:acetaminophen  Q6H PRN, alteplase  BID PRN, alum-mag hydroxide-simeth Q4H PRN **AND** [EXPIRED] lidocaine  hcl viscous ONCE, calcium  carbonate Q6H PRN, cetirizine  QDAY PRN, diphenoxylate -atropine  QID PRN, fentaNYL  citrate PF Q3H while awake PRN, flumazeniL  PRN, hydrOXYzine  HCL Q6H PRN, loperamide  QID PRN, melatonin QHS PRN, nalOXone  PRN, ondansetron  Q6H PRN **OR** ondansetron  (ZOFRAN ) IV Q6H PRN, oxyCODONE  Q4H PRN, polyethylene glycol 3350  QDAY PRN, sennosides-docusate sodium  QDAY PRN, sodium chloride  0.9% TKO infusion PRN, trimethobenzamide  Q6H PRN        Objective:                          Vital Signs: Last Filed                 Vital Signs: 24 Hour Range   BP: 125/62 (06/17 0842)  Temp: 36.6 ?C (97.9 ?F) (06/17 9157)  Pulse: 58 (06/17 0842)  Respirations: 16 PER MINUTE (06/17 0842)  SpO2: 98 % (06/17 0842)  O2 Device: None (Room air) (06/17 0842) BP: (113-125)/(55-65)   Temp:  [36.5 ?C (97.7 ?F)-36.8 ?C (98.2 ?F)]   Pulse:  [58-73]   Respirations:  [  16 PER MINUTE]   SpO2:  [95 %-98 %]   O2 Device: None (Room air)     Vitals:    08/31/23 0244 09/02/23 0500 09/04/23 0439   Weight: 61.3 kg (135 lb 3.2 oz) 61.6 kg (135 lb 12.8 oz) 61.3 kg (135 lb 2.3 oz)         Intake/Output Summary:  (Last 24 hours)    Intake/Output Summary (Last 24 hours) at 09/04/2023 0918  Last data filed at 09/04/2023 0850  Gross per 24 hour   Intake 1656 ml   Output 2465 ml   Net -809 ml           Physical Exam  Physical Exam  Vitals reviewed.   Pulmonary:      Effort: Pulmonary effort is normal.   Abdominal:      Comments: hepatic drain in place    Musculoskeletal:      Right lower leg: Edema present.      Left lower leg: Edema present.   Skin:     Coloration: Skin is jaundiced.   Neurological:      Mental Status: She is alert.           Lab Review  Pertinent labs reviewed and noted in A/P.     Point of Care Testing  (Last 24 hours)  Glucose: (!) 138 (09/04/23 0437)    Radiology and other Diagnostics Review:    Pertinent radiology reviewed and noted in A/P.

## 2023-09-04 NOTE — Progress Notes
 Transplant & Immunocompromised  Infectious Diseases Progress Note    Today's Date:  09/04/2023  Admission Date: 08/20/2023    Reason for this consultation: Elevating white count with hyperbilirubinemia     Assessment:     Cholestatic jaundice due to malignant obstruction without evidence of ascending cholangitis  Obstructive biliary tree; slender left intrahepatic ducts; tumor ingrowth into midportion of metal stent; LHD opacified with difficulty  08/03/2023 admitted to [facility] with acute cholangitis  Started Zosyn   08/04/2023 urine culture: >100K CFU Enterococcus faecalis (S) ampicillin   08/06/2023 underwent ERCP which revealed indwelling uncovered metal biliary stent in the left hepatic duct which was exchanged with plastic stent, mild stenosis within the metal stent at the proximal main bile duct, diffuse malignant stenoses of intrahepatic ducts; presence of purulence  08/07/2023 discontinued Zosyn ; started Augmentin ; discharged home  08/16/2023 underwent ERCP which revealed a protruding indwelling metallic stent in CBD seen coming out from ampulla with a plastic stent inside the metal stent, plastic stent was removed, occlusion cholangiogram: right posterior intrahepatic duct opacified; left hepatic system not opacified and totally; right anterior intrahepatic with branches partially opacified with significant narrowing beyond indwelling stent up to the hilum of liver; palced a Habiba catheter size 8 French, 2.7 and RFA x 2; large amount of tumor ingrowth inside the indwelling stent with large amount of tissue growing at the entrance of the stent which again this area also tumor destruction applied using RFA x 2; placed a 10 French, 10 cm double-pigtail plastic stent was successfully placed into the right intrahepatic duct; good outflow of bile and contrast seen through both the stents  08/20/2023 seen in oncology clinic [facility], noted to have persistent jaundice status post ERP and stent placement, received cefepime  x 1 dose  08/20/23: Directly admitted to the hospitalist service  Blood cultures: No growth x 2 sets  Urine culture: No growth  Started Zosyn   08/21/23 ERCP: uncovered metal stent in CBD extending out of ampulla with a double-pigtail plastic stent through the lumen of the uncovered metal stent, double-pigtail plastic stent removed, stone extraction balloon passed into left intrahepatic duct, balloon sweeps with extraction of a large amount of sludge, food debris and stent debris;  occlusion cholangiogram: Narrowing within the midportion of the stent consistent with tumor/tissue ingrowth.  RHD mildly dilated without stricture.  LHD opacified with difficulty.  Left intrahepatic ducts were slender and not well-visualized.  Palced 10 mm x 8 cm VIABIL stent within the previously placed uncovered biliary stent, upper end of the VIABIL stent was below the hilar confluence, good outflow of contrast after stent deployment   08/23/2023 CT ABD/pelvis: Development of small left pleural effusion and minimal basilar   bronchiolitis. No small bowel obstruction, ascites, or free air. Redemonstration of extensive partially calcified peritoneal metastatic disease. New small subcutaneous nodule right lateral pelvis which is probably a new metastasis. Right ischial pressure ulcer.   08/28/2023 IR: Placement of internal/external biliary drain (8 [place]) into the left ductal system  No meaningful drain output; progressive leukocytosis  08/30/2023 IR: Upsizing of internal/external biliary drain (8 Fr -> 12 Fr) within the left ductal system.  Some particulate matter identified on cholangiogram  - 6/12 biliary fluid GS: Moderate PMN, few budding yeast, few GPC in singles/pairs; routine culture + coag-negative Staphylococcus, anaerobic culture + Veillonella parvula  6/14 IR: Persistent biliary ductal dilation. Successful upsize to 24fr drain. 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 with subsequent removal of ureteral stents  02/2017 diagnosed on laparoscopy  04/03/2017 underwent loop ileostomy creation  12/2017 started epacadosta and sirolimus   05/2018 discontinued epacadosta and sirolimus   05/27/2020 underwent laparoscopic HIPEC with mitomycin-C (trial at [facility])  10/07/2018 underwent second laparoscopic HIPEC  09/12/2018 underwent tumor debulking [facility])   05/2020 underwent radiation therapy to right lung metastases  04/16/2020 underwent cytoreductive surgery with ostomy revision due to tumor invasion  09/2020 started FOLFIRI and bevacizumab    09/07/2021 MRI/MRCP: Slight apparent increase in diffuse intrahepatic and extrahepatic biliary ductal dilatation to the level of the porta hepatis where there is   extrinsic compression on the extrahepatic common duct from metastases. No   biliary filling defect to suggest choledocholithiasis or biliary mass. Grossly unchanged extensive peritoneal carcinomatosis and right abdominal wall metastases. Partially visualized indeterminate lesion in the left sacrum, which may   be a metastasis or hemangioma.   09/16/2021 underwent ERCP this month of an uncovered metal stent in the CBD  09/2021 held bevacizumab    08/04/2023 CT ABD/pelvis: Extensive metastatic disease throughout the abdomen and pelvis including peritoneal implants with mass effect on adjacent structures. Left posterior pleural implant with invasion of the chest wall and adjacent smaller nodules. Common bile duct and biliary stent in place. Moderate to intrahepatic biliary ductal dilatation. Right lower quadrant ostomy encased with implants. No gas-filled dilated loops of bowel to suggest obstruction. Mild left hydronephrosis with prominent left extrarenal pelvis. Diffuse bladder wall thickening with adjacent fat stranding, possibly secondary to acute cystitis. Correlation with urinalysis is recommended. Distended vaginal cuff with air-fluid level. The posterior wall of the urinary bladder is adjacent to the vaginal cuff without preserved fat plane of separation.   08/20/2023 CEA 2565.5  Currently FOLFIRI cycle 40     CKD, stage IV     Recurrent urinary tract infections     Pelvic abscess following tumor debulking  10/2018 abscess cultures: Serratia and Candida albicans  Underwent IR abscess drain placement  Received course of cefepime  and fluconazole      Asplenia, surgical  02/2018 Pneumovax   09/2018 PCV13 (Prevnar)   09/2018 Haemophilus influenzae type B   Meningococcal conjugate vaccine (Menveo) 09/30/2018 and 11/2018  Meningococcal serogroup B vaccine (MenB) 09/30/2018 and 10/28/2018    Recommendations:     Recent ERCPs confirmed further tumor growth with some obstruction of the existing metal stent within the CBD as well as a partially obstructed left hepatic ductal system, as evidenced on the cholangiogram.    Patient has not had fever but has developed progressive leukocytosis and had persistent elevation of hepatic transaminases, alkaline phosphatase and total bilirubin.  On 08/28/2023, she underwent placement of internal/external biliary drain into the left hepatic ductal system to decompress.  However, effectively no output from this new enteral/external biliary drain.  Drain was upsized 6/12 and 614 from 8Fr -> 18Fr catheter.  Now with better output.    Please continue ampicillin /sulbactam for now.  White blood cell count improved today.  Duration of antibiotic therapy to be determined based on adequate decompression of the biliary tree and improvement of leukocytosis.  Monitor temp and WBC count.    We'll follow.    Interval Hx     Patient afebrile  Hemodynamically stable  On room air    Feeling well well  No fever, chills or sweating  No shortness of breath or cough  No nausea vomiting or abdominal pain  Ostomy output increased from the day before  No dysuria or hematuria    Ileostomy output in the past 24 hours: 1200 mL.      WBC 21  Hemoglobin 6.9  Platelets 128  Creatinine 3.71  AST 74  ALT 54  ALP 214  T. bili 13.7    Drain output over previous 24 hours:  Left internal/external biliary drain: 935 mL     6/16 LLE US /Doppler negative for DVT.    Antimicrobial Start date End date   Cefepime  6/2 6/2   Pip/tazo 6/2 6/6   Amp/sulb 6/11 Active                       Estimated Creatinine Clearance: 12.5 mL/min (A) (based on SCr of 3.71 mg/dL (H)).    Medications   Scheduled Meds:ampicillin /sulbactam (UNASYN ) 3 g in sodium chloride  0.9% (NS) 100 mL IVPB (MB+), 3 g, Intravenous, Q24H*  ERGOcalciferoL  (vitamin D2) (DRISDOL ) capsule 50,000 Units, 50,000 Units, Oral, Q7 Days  heparin  (porcine) PF syringe 5,000 Units, 5,000 Units, Subcutaneous, Q8H  pantoprazole  (PROTONIX ) injection 40 mg, 40 mg, Intravenous, API(88-78)  sodium chloride  0.9 % (flush) syringe 10 mL, 10 mL, Intra-catheter, BID  sodium chloride  0.9 % (flush) syringe 10 mL, 10 mL, Intra-catheter, BID    Continuous Infusions:      PRN and Respiratory Meds:acetaminophen  Q6H PRN, alteplase  BID PRN, alum-mag hydroxide-simeth Q4H PRN **AND** [EXPIRED] lidocaine  hcl viscous ONCE, calcium  carbonate Q6H PRN, cetirizine  QDAY PRN, diphenoxylate -atropine  QID PRN, fentaNYL  citrate PF Q3H while awake PRN, flumazeniL  PRN, hydrOXYzine  HCL Q6H PRN, loperamide  QID PRN, melatonin QHS PRN, nalOXone  PRN, ondansetron  Q6H PRN **OR** ondansetron  (ZOFRAN ) IV Q6H PRN, oxyCODONE  Q4H PRN, polyethylene glycol 3350  QDAY PRN, sennosides-docusate sodium  QDAY PRN, sodium chloride  0.9% TKO infusion PRN, trimethobenzamide  Q6H PRN      Allergies     Allergies   Allergen Reactions    Indomethacin  SEE COMMENTS     Pain for several days after suppository post ERCP. Tolerated IV but not PR    Shrimp SEE COMMENTS     Eye irritation. Namely blood vessel issues in the eyes        Physical Examination                          Vital Signs: Last                  Vital Signs: 24 Hour Range   BP: 123/62 (06/17 0431)  Temp: 36.5 ?C (97.7 ?F) (06/17 0431)  Pulse: 61 (06/17 0431)  Respirations: 16 PER MINUTE (06/17 0431)  SpO2: 98 % (06/17 0431)  O2 Device: None (Room air) (06/17 0431) BP: (113-123)/(55-65)   Temp:  [36.5 ?C (97.7 ?F)-36.8 ?C (98.2 ?F)]   Pulse:  [61-84]   Respirations:  [16 PER MINUTE]   SpO2:  [95 %-99 %]   O2 Device: None (Room air)     Gen: Awake, alert, NAD, fatigued appearing  HEENT: EOMI, scleral icterus present, no visible thrush  Lungs: Clear breath sounds bilaterally  Heart: Regular, no murmur  Abd: + BS, soft, no tenderness to palpation, moderately distended, left internal/external biliary drain draining bilious fluid a lot of sediment in the bag, ostomy intact without surrounding tenderness  Ext: 1+ bilateral  lower extremity edema, tenderness to palpation of the left calf  Skin: Jaundice, no acute rash  Musculoskel: No warm or swollen joints  Psych: Oriented x 3 with normal mood and affect    Lines: Port-A-cath accessed, unremarkable aside    Drains/tubes:  Ileostomy RLQ  Left int/ext biliary drain     Lab Review   Hematology  Recent Labs     09/02/23  0354 09/03/23  0300 09/04/23  0437   WBC 24.50* 29.40* 21.00*   HGB 7.1* 7.4* 6.9*   HCT 20.1* 21.5* 19.6*   PLTCT 152 147* 128*   INR 1.3* 1.3* 1.2     Chemistry  Recent Labs     09/02/23  0354 09/03/23  0300 09/04/23  0437   NA 137 136* 135*   K 4.3 4.3 3.9   CL 104 101 100   CO2 25 26 25    BUN 30* 31* 32*   CR 3.26* 3.45* 3.71*   GFR 15* 14* 13*   GLU 87 78 138*   CA 7.7* 7.7* 7.5*   PO4 2.9 3.3 3.5   ALBUMIN  1.9* 2.0* 1.9*   ALKPHOS 273* 238* 214*   AST 79* 70* 74*   ALT 59* 52 54   TOTBILI 16.4* 15.4* 13.7*       Microbiology, Radiology and other Diagnostics Review Microbiology data reviewed.    Pertinent radiology images viewed.       Clem JINNY Sheen, MD   Contact via [facility]  Transplant & Immunocompromised Infectious Diseases

## 2023-09-05 ENCOUNTER — Encounter: Admit: 2023-09-05 | Discharge: 2023-09-05 | Payer: MEDICARE

## 2023-09-05 ENCOUNTER — Inpatient Hospital Stay: Admit: 2023-09-05 | Discharge: 2023-09-05 | Payer: MEDICARE

## 2023-09-05 LAB — MANUAL DIFF
~~LOC~~ BKR EOSINOPHILS - RELATIVE: 4 % (ref 0–5)
~~LOC~~ BKR LYMPHOCYTES - RELATIVE: 4 % — ABNORMAL LOW (ref 24–44)
~~LOC~~ BKR MONOCYTES - RELATIVE: 13 % — ABNORMAL HIGH (ref 4–12)
~~LOC~~ BKR NEUT+BANDS - ABSOLUTE: 14 10*3/uL — ABNORMAL HIGH (ref 1.8–7.0)
~~LOC~~ BKR NEUTROPHILS - RELATIVE: 79 % — ABNORMAL HIGH (ref 41–77)
~~LOC~~ BKR NUCLEATED RBC MANUAL: 1 10*3/uL

## 2023-09-05 LAB — PREPARE RBC - SUNQUEST
ANTIBODY SCREEN: NEGATIVE
BLOOD EXPIRATION DATE: 202
CODING STATUS: 510
ISSUE DATE TIME: 202
PRODUCT CODE: 0
UNIT DIVISION: 0
UNITS ORDERED: 1

## 2023-09-05 LAB — COMPREHENSIVE METABOLIC PANEL
~~LOC~~ BKR CALCIUM: 8.1 mg/dL — ABNORMAL LOW (ref 8.5–10.6)
~~LOC~~ BKR TOTAL PROTEIN: 6.7 g/dL — ABNORMAL LOW (ref 6.0–8.0)

## 2023-09-05 LAB — TYPE & CROSSMATCH
~~LOC~~ BKR ANTIBODY SCREEN: NEGATIVE
~~LOC~~ BKR UNITS ORDERED: 1

## 2023-09-05 MED ORDER — LACTATED RINGERS IV SOLP
Freq: Once | INTRAVENOUS | 0 refills | Status: DC
Start: 2023-09-05 — End: 2023-09-05

## 2023-09-05 MED ORDER — LACTATED RINGERS IV SOLP
INTRAVENOUS | 0 refills | Status: AC
Start: 2023-09-05 — End: ?
  Administered 2023-09-05: 15:00:00 1000.0000 mL via INTRAVENOUS

## 2023-09-05 NOTE — Consults
 Pre Procedure History and Physical/Sedation Plan    **A full consult has been completed regarding this patient during this admission previously. Therefore this consult will only link to an updated pre-procedure H&P. For details of patient assessment, please see previous IR consult. Thank you.    Procedure Date: 09/05/2023     Planned Procedure(s):  Right sided biliary drain placement     Procedural code status: Full Code    Indication:  Persistent hyperbilirubinemia  __________________________________________________________________    Chief Complaint:  See above     History of Present Illness: Vicki Buchanan is a 68 y.o. 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 Patient s/p left sided biliary drain placement 08/28/2023 and subsequent upsize of biliary drain to 26F 09/01/2023. T bili was trending down but increased from 13.7 to 16.0 overnight. Family notes patient got blood transfusion overnight and questioning if it could be related to this. Imaging reviewed with Dr. Jacquie and cholangiogram showed free spillage of contrast into right sided biliary system but did note significant debris in biliary system. Patient and family agreeable to R sided biliary drain 6/19 if no significant improvement in bilirubin overnight. Patient notes issue with clogged ostomy causing nausea, vomiting and abdominal pain.       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/2017    Severe malnutrition 04/04/2017    SBO (small bowel obstruction) (CMS-HCC) 03/23/2017    Peritoneal carcinomatosis (CMS-HCC) 03/07/2017     Past Medical History:    Acid reflux    Allergy    Bundle branch block    Cancer (CMS-HCC)    Cancer of appendix (CMS-HCC)    Cancer of colon (CMS-HCC)    Pneumonia    Sarcoma (CMS-HCC)      Surgical History:   Procedure Laterality Date    CESAREAN SECTION  1981    HX CHOLECYSTECTOMY  1995    LAPAROSCOPY  02/2017    biopsy peritoneal    COLONOSCOPY DIAGNOSTIC WITH SPECIMEN COLLECTION BY BRUSHING/ WASHING - FLEXIBLE N/A 03/19/2017    Performed by Malissa Norleen LABOR, MD at [facility] ENDO    COLONOSCOPY WITH BIOPSY - FLEXIBLE  03/19/2017    Performed by Malissa Norleen LABOR, MD at [facility] ENDO    EXPLORATORY LAPAROTOMY, DIVERTING LOOP ILEOSTOMY N/A 04/03/2017    Performed by Al-Kasspooles, Mazin, MD at [facility] OR    INSERTION TUNNELED CENTRAL VENOUS CATHETER - AGE 78 YEARS AND OVER Left 04/23/2017    Performed by Al-Kasspooles, Mazin, MD at [facility] OR    CYSTOURETHROSCOPY WITH INDWELLING URETERAL STENT INSERTION Bilateral 04/09/2018    Performed by Erskin Lenis, MD at [facility] OR    CYSTOURETHROSCOPY WITH URETERAL CATHETERIZATION WITH/ WITHOUT IRRIGATION/ INSTILLATION/ URETEROPYELOGRAPHY Bilateral 04/09/2018    Performed by Erskin Lenis, MD at [facility] OR    CYSTOURETHROSCOPY WITH INDWELLING URETERAL STENT EXCHANGE (RIGHT 6 x 26cm / LEFT 6 x 28cm) Bilateral 08/08/2018    Performed by Erskin Lenis,  MD at [facility] OR    RETROGRADE UROGRAPHY WITH/ WITHOUT KUB Bilateral 08/08/2018    Performed by Erskin Lenis, MD at [facility] OR    ENDOSCOPIC RETROGRADE CHOLANGIOPANCREATOGRAPHY WITH SPHINCTEROTOMY/ PAPILLOTOMY N/A 09/16/2021    Performed by Rogers Kipper, MD at [facility] ENDO    ESOPHAGOGASTRODUODENOSCOPY WITH ENDOSCOPIC ULTRASOUND EXAMINATION - FLEXIBLE  09/16/2021    Performed by Rogers Kipper, MD at [facility] ENDO    ENDOSCOPIC RETROGRADE CHOLANGIOPANCREATOGRAPHY WITH PLACEMENT ENDOSCOPIC STENT INTO BILIARY/ PANCREATIC DUCT - EACH STENT  09/16/2021    Performed by Rogers Kipper, MD at [facility] ENDO    COLONOSCOPY DIAGNOSTIC WITH SPECIMEN COLLECTION BY BRUSHING/ WASHING - FLEXIBLE N/A 09/26/2021    Performed by Rogers Kipper, MD at [facility] OR    ENDOSCOPIC RETROGRADE CHOLANGIOPANCREATOGRAPHY WITH PLACEMENT ENDOSCOPIC STENT INTO BILIARY/ PANCREATIC DUCT - EACH STENT  09/29/2021    Performed by Noralyn Blossom, MD at [facility] ENDO    ENDOSCOPIC RETROGRADE CHOLANGIOPANCREATOGRAPHY with plastic stent exchange N/A 10/27/2021    Performed by Noralyn Blossom, MD at [facility] ENDO    ENDOSCOPIC RETROGRADE CHOLANGIOPANCREATOGRAPHY WITH ABLATION TUMOR/ POLYP/ OTHER LESION  10/27/2021    Performed by Noralyn Blossom, MD at [facility] ENDO    ENDOSCOPIC RETROGRADE CHOLANGIOPANCREATOGRAPHY WITH REMOVAL CALCULI/ DEBRIS FROM BILIARY/ PANCREATIC DUCT  10/27/2021    Performed by Noralyn Blossom, MD at [facility] ENDO    ENDOSCOPIC RETROGRADE CHOLANGIOPANCREATOGRAPHY  plastic stent replacement N/A 12/22/2021    Performed by Noralyn Blossom, MD at [facility] ENDO    ENDOSCOPIC RETROGRADE CHOLANGIOPANCREATOGRAPHY N/A 03/30/2022    Performed by Noralyn Blossom, MD at [facility] ENDO    ENDOSCOPIC RETROGRADE CHOLANGIOPANCREATOGRAPHY WITH REMOVAL AND EXCHANGE OF STENT BILIARY/ PANCREATIC DUCT - EACH STENT EXCHANGED N/A 03/30/2022    Performed by Noralyn Blossom, MD at [facility] ENDO    ENDOSCOPIC RETROGRADE CHOLANGIOPANCREATOGRAPHY WITH REMOVAL AND EXCHANGE OF STENT BILIARY/ PANCREATIC DUCT - EACH STENT EXCHANGED N/A 07/27/2022    Performed by Noralyn Blossom, MD at [facility] ENDO    ENDOSCOPIC RETROGRADE CHOLANGIOPANCREATOGRAPHY N/A 11/02/2022    Performed by Noralyn Blossom, MD at [facility] ENDO    ENDOSCOPIC RETROGRADE CHOLANGIOPANCREATOGRAPHY WITH REMOVAL AND EXCHANGE OF STENT BILIARY/ PANCREATIC DUCT - EACH STENT EXCHANGED N/A 11/02/2022    Performed by Noralyn Blossom, MD at [facility] ENDO    ENDOSCOPIC RETROGRADE CHOLANGIOPANCREATOGRAPHY WITH REMOVAL AND EXCHANGE OF STENT BILIARY/ PANCREATIC DUCT - EACH STENT EXCHANGED N/A 01/25/2023 Performed by Noralyn Blossom, MD at [facility] ENDO    ENDOSCOPIC RETROGRADE CHOLANGIOPANCREATOGRAPHY WITH REMOVAL AND EXCHANGE OF STENT BILIARY/ PANCREATIC DUCT - EACH STENT EXCHANGED N/A 04/26/2023    Performed by Noralyn Blossom, MD at [facility] ENDO    ENDOSCOPIC RETROGRADE CHOLANGIOPANCREATOGRAPHY WITH REMOVAL AND EXCHANGE OF STENT BILIARY/ PANCREATIC DUCT - EACH STENT EXCHANGED N/A 07/26/2023    Performed by Noralyn Blossom, MD at [facility] ENDO    ENDOSCOPIC RETROGRADE CHOLANGIOPANCREATOGRAPHY WITH REMOVAL CALCULI/ DEBRIS FROM BILIARY/ PANCREATIC DUCT N/A 08/16/2023    Performed by Noralyn Blossom, MD at [facility] ENDO    ENDOSCOPIC RETROGRADE CHOLANGIOPANCREATOGRAPHY WITH REMOVAL AND EXCHANGE OF STENT BILIARY/ PANCREATIC DUCT - EACH STENT EXCHANGED N/A 08/16/2023    Performed by Noralyn Blossom, MD at [facility] ENDO    ENDOSCOPIC RETROGRADE CHOLANGIOPANCREATOGRAPHY, WITH NEOPLASM OR POLYP ABLATION N/A 08/16/2023    Performed by Noralyn Blossom, MD at [facility] ENDO    ENDOSCOPIC RETROGRADE CHOLANGIOPANCREATOGRAPHY WITH REMOVAL AND EXCHANGE OF STENT BILIARY/ PANCREATIC DUCT - EACH STENT EXCHANGED N/A 08/21/2023    Performed by Rogers Kipper, MD at [facility] ENDO  COLON SURGERY  7/20    COLON SURGERY  7/20    HX APPENDECTOMY  1/10    Guess on date    HX CESAREAN SECTION  4/81    HX HYSTERECTOMY  7/20    HX LOWER ANTERIOR RESECTION OF COLON      7/20    HX SALPINGO-OOPHORECTOMY      ILEOSTOMY OR JEJUNOSTOMY Right     LIVER DONOR SURGERY  12/24/21    Liver stents    OVARY SURGERY      Removed 7/20    PORTACATH PLACEMENT  1/20    PR PNCRTECT PROX STOT W/PANCREATOJEJUNOSTOMY  7/20    STOMACH SURGERY      7/20    TUNNELED VENOUS PORT PLACEMENT Left 1/19    URETER STENT PLACEMENT Bilateral       Social History     Tobacco Use    Smoking status: Never    Smokeless tobacco: Never   Substance Use Topics    Alcohol use: Not Currently     Comment: rarely      Family History   Problem Relation Name Age of Onset    Diabetes Mother Murray     Cancer Father Lamar     Diabetes Brother Octaviano Cancer-Lung Paternal Aunt Doris     Cancer-Breast Maternal Grandmother Helen     Diabetes Paternal Grandmother Annabelle       Medications Prior to Admission   Medication Sig Dispense Refill Last Dose/Taking    acetaminophen /lidocaine /antacid(#) (GI COCKTAIL) 1:1:1 oral suspension Take 30 mL by mouth every 4 hours as needed. 900 mL 3 Taking As Needed    apap-maalox-lidocaine  1:3:1 oral suspension (MIXTURE COMPOUND) Take 30 mL by mouth every 4 hours as needed. (Patient taking differently: Take  by mouth every 4 hours as needed.) 900 mL 0 Taking Differently    calcium  carbonate (TUMS) 500 mg (200 mg elemental calcium ) chewable tablet Chew one tablet by mouth every 6 hours as needed. 100 tablet  Taking As Needed    carvediloL  (COREG ) 6.25 mg tablet Take one tablet by mouth twice daily. Take with food.  Hold if systolic blood pressure < 110 819 tablet 0 >1 Month    cetirizine  (ZYRTEC ) 10 mg tablet Take one tablet by mouth daily.   Taking    diphenoxylate -atropine  (LOMOTIL ) 2.5-0.025 mg tablet Take one tablet by mouth four times daily as needed for Diarrhea. 120 tablet 3 Taking As Needed    esomeprazole  DR (NEXIUM ) 20 mg capsule Take one capsule by mouth daily. Take on an empty stomach at least 1 hour before or 2 hours after food.   Taking    hydrOXYzine  HCL (ATARAX ) 25 mg tablet Take one tablet by mouth every 6 hours as needed for Itching.   Taking As Needed    [EXPIRED] levoFLOXacin  (LEVAQUIN ) 250 mg tablet Take three tablets by mouth daily for 1 day, THEN two tablets every 48 hours for 7 days. Take 3 tablets by mouth for 1 day then 48 hours later take 2 tablets every 48 hours for 7 days 9 tablet 0 08/19/2023 Evening    loperamide  (IMODIUM  A-D) 2 mg capsule Take one pill by mouth four times daily as needed 120 capsule 11 Taking    magnesium  oxide 400 mg magnesium  tablet Take one tablet by mouth twice daily. 90 tablet 11 Taking    ondansetron  (ZOFRAN  ODT) 8 mg rapid dissolve tablet DISSOLVE 1 TABLET UNDER THE TONGUE EVERY 8 HOURS AS NEEDED FOR NAUSEA AND  VOMITING 60 tablet 0 Taking    potassium chloride  (KLOR-CON  SPRINKLE) 10 mEq capsule Take one capsule by mouth twice daily. If potassium < 3.5. Take with a meal and a full glass of water . 60 capsule 3 Past Month    sod chlor,sod bicarb/neti pot (SINUS WASH NETI POT SINI) Use as directed   Taking    traMADoL  (ULTRAM ) 50 mg tablet Take one tablet to two tablets by mouth daily as needed for Pain. 75 tablet 0 Taking As Needed     Allergies   Allergen Reactions    Indomethacin  SEE COMMENTS     Pain for several days after suppository post ERCP. Tolerated IV but not PR    Shrimp SEE COMMENTS     Eye irritation. Namely blood vessel issues in the eyes        Review of Systems  Constitutional: negative for fevers and chills  Respiratory: negative for cough or dyspnea  Gastrointestinal: positive for nausea, vomiting, and abdominal pain    Previous Personal Anesthetic/Sedation History:  Denies adverse events related to sedation/anesthesia.     Previous Family Anesthetic/Sedation History: Denies adverse events related to sedation/anesthesia.    Physical Exam:  Vital Signs: Last Filed In 24 Hours Vital Signs: 24 Hour Range   BP: 139/74 (06/18 0831)  Temp: 36.5 ?C (97.7 ?F) (06/18 0831)  Pulse: 70 (06/18 0831)  Respirations: 16 PER MINUTE (06/18 0831)  SpO2: 97 % (06/18 0831)  O2 Device: None (Room air) (06/18 0831) BP: (139-177)/(69-80)   Temp:  [36.5 ?C (97.7 ?F)-37.2 ?C (98.9 ?F)]   Pulse:  [60-73]   Respirations:  [16 PER MINUTE-18 PER MINUTE]   SpO2:  [95 %-99 %]   O2 Device: None (Room air)          General appearance: Alert and no distress noted.  Neurologic: Grossly normal.  Lungs: Non labored at rest.  Biliary drain flushed easily with 20 ml NS - bag to DD with bilious output    Airway: airway assessment performed  Mallampati III (soft palate, base of uvula visible)  Head and Neck: no abnormalities noted  Mouth: no abnormalities noted  NPO status: Acceptable  Pregnancy Status: Not Pregnant  Anesthesia Classification:  ASA III (A patient with a severe systemic disease that limits activity, but is not incapacitating)  Pre-operative anxiolysis Plan: Midazolam   Sedation/Medication Plan: Fentanyl , Lidocaine , and Midazolam   Discussion/Reviews:  Physician has discussed risks and alternatives of this type of sedation and above planned procedures with patient    Lab/Radiology/Other Diagnostic Tests:  Labs:  Hematology:    Lab Results   Component Value Date    HGB 9.8 09/05/2023    HGB 8.4 08/14/2023    HCT 27.9 09/05/2023    HCT 24.0 08/14/2023    PLTCT 133 09/05/2023    PLTCT 384 08/14/2023    WBC 17.80 09/05/2023    WBC 15.50 08/14/2023    NEUT 64.4 09/03/2023    NEUT 46 01/25/2023    ANC 14.1 09/05/2023    ANC 4.49 01/25/2023    LYMPH 4 09/05/2023    LYMPH 36 11/02/2022    ALC 8.10 09/03/2023    ALC 2.24 01/25/2023    MONA 6.2 09/03/2023    MONA 16 01/25/2023    AMC 1.80 09/03/2023    AMC 1.53 01/25/2023    EOSA 1.3 09/03/2023    EOSA 14 01/25/2023    ABC 0.20 09/03/2023    ABC 0.06 01/25/2023    BASOPHILS 1 09/04/2023  BASOPHILS 1 11/02/2022    MCV 95.6 09/05/2023    MCV 92.3 08/14/2023    MCH 33.6 09/05/2023    MCH 32.3 08/14/2023    MCHC 35.1 09/05/2023    MCHC 35.0 08/14/2023    MPV 10.0 09/05/2023    MPV 10.8 08/14/2023    RDW 18.5 09/05/2023    RDW 25.1 08/14/2023   , Coagulation:    Lab Results   Component Value Date    PT 13.3 09/05/2023    PT 14.0 01/25/2023    PTT 33.8 08/21/2023    PTT 28.7 07/10/2018    INR 1.2 09/05/2023    INR 1.3 01/25/2023   , General Chemistry:    Lab Results   Component Value Date    NA 136 09/05/2023    NA 140 08/14/2023    K 3.7 09/05/2023    K 4.1 08/14/2023    CL 101 09/05/2023    CL 108 08/14/2023    CO2 22 09/05/2023    CO2 22.0 08/14/2023    GAP 13 09/05/2023    GAP 11 01/25/2023    BUN 32 09/05/2023    BUN 25.5 08/14/2023    CR 3.67 09/05/2023    CR 2.82 08/14/2023    GLU 103 09/05/2023    GLU 150 08/14/2023    CA 8.1 09/05/2023    CA 9.4 08/14/2023 ALBUMIN  2.1 09/05/2023    ALBUMIN  2.2 08/14/2023    LACTIC 1.1 08/20/2023    MG 1.6 09/05/2023    MG 1.9 01/25/2023    TOTBILI 16.0 09/05/2023    TOTBILI 15.87 08/14/2023    PO4 2.8 09/05/2023    PO4 3.3 01/30/2022              Tinnie CHRISTELLA Flor, APRN-NP  Pager [phone]

## 2023-09-05 NOTE — Unmapped
 Patient/family declined:  Bed/chair alarm and W/in arms reach during toileting/showering.    Patient/family educated on importance of intervention to their safety/quality of care. Patient/family continues to decline care.    Reason Why Patient/Family declined:  Patient wants to ambulate with the help of family rather than nursing staff. Patient agreed to call staff if feeling weak/dizzy, or needing any other help.    Individualized safety and/or care plan implemented. If additional safety measures implemented, please list them.     Escalated to:  Unit coordinator/charge nurse.

## 2023-09-05 NOTE — Progress Notes
 General Medicine Daily Progress Note    Name: Vicki Buchanan        MRN: [id number]          DOB: 1955-12-14            Age: 68 y.o.  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     Mucinous adenocarcinoma of the appendix  hyperbilirubinemia  malignant biliary obstruction  ascending cholangitis   post ERCP pancreatitis   - admitted with rising bili, leukocytosis, anemia  - oncology consulted, appreciate assistance  - recently hospitalized at [facility] with pus noted to be coming out of her bile ducts during ERCP 08/06/23  - biliary consulted, s/p ERCP 08/21/23: Uncovered metal stent coming out of the ampulla with double-pigtail plastic stent going through the lumen of the metal stent.  Food and stent debris seen in stent lumen.  Occlusion cholangiogram performed that showed narrowing in the midportion of the stent consistent with tumor/tissue ingrowth.  Right hepatic duct mildly dilated without any obvious stricture.  Left intrahepatic ducts normal-appearing.  A 10 mm x 8 cm viabil stent was deployed within the previously placed uncovered biliary stent with good outflow of contrast.   - CT A/P 6/5:   Development of small left pleural effusion and minimal basilar bronchiolitis.  No small bowel obstruction, ascites, or free air.  Redemonstration of extensive partially calcified peritoneal metastatic disease.   New small subcutaneous nodule right lateral pelvis which Buchanan probably a new metastasis.   Right ischial pressure ulcer.   - abdominal pain, elevated lipase post ERCP: post-procedure pancreatitis  Plan  - ID consulted, on unasyn . Duration to be determined by WBC and liver enzymes/bilirubin. 6/18 continue for now, WBC improving  -Status post placement of 8 French internal/external biliary drain via a dilated peripheral left duct.  Dilated left biliary tree. 6/14 upsized to 18 [place] biliary drain due to concern for malfunction. Cutlures growing moderate budding yeast, rare neutrophils, coag neg staph. 6/18 bilirubin rising, discussed with IR and plan for procedure 6/19 for consideration of additional drain   - Biliary drain should be placed to gravity drainage until bilirubin normalizes and flushed with 10 mL normal saline twice a day.  Patient follow-up in 3 months for routine preventive maintenance of biliary catheters.    Decreased ostomy output  > Repeat CT A/P wo contrast 6/18 to rule out obstruction      Anemia  > 1 unit prbcs ordered on 6/7.  > 6/17 Hgb dropped to 6.9, 1 unit ordered. 6/18 improved      AKI on CKD4  chronic mild L hydronephrosis   - follows with Dr. Marlo as outpatient  Plan  > Continue IVF for goal net even to slightly positive. 6/18 give 2L IVF @100mL /hr  > Nephrology following     LLE pain  > LLE doppler neg for DVT     GERD  - continue PPI  - add PRN GI cocktail  FEN:   Fluids: LR  Monitor and replace electrolytes as needed  Diet: cardiac   DVT PPX: heparin  SQ held with drop in hgb      CODE STATUS: FULL CODE   NOK: husband     Disposition:  continue admission. Home on discharge pending WBC and bilirubin, abx plan, Cr.       Malnutrition Details:  Malnutrition present  ICD-10 code E44: Acute illness/Moderate non-severe malnutrition  Mild loss of body fat, Mild loss of muscle mass, Energy Intake: Less than 75% of estimated energy requirement for greater than 7days            Loss of Subcutaneous Fat: Yes Mild Triceps  Muscle Wasting: Yes Mild Clavicle               Malnutrition Interventions: Encouraged small frequent meals, reviewed meal ideas, encouraged Ensure supplements      Wound:                   Brian Prime, MD  Hospitalist, Internal Medicine    Voalte Buchanan the preferred method of communication.   Please use the Med Private First Call for all patient-related communications. Personal Voaltes and pagers are not answered at all hours.      High medical decision making due to the following:  independent interpretation of a test (CBC, CMP)  and discussion of management or test interpretation with IR (physician(s) or other qualified health care professional outside of my specialty)  drug therapy requiring intensive monitoring for toxicity(IV abx)        _______________________________________________________________________    Subjective  Vicki Buchanan a 68 y.o. female.  Seen at bedside, sister present. Had a bad night due to decreased ostomy output and some nausea/vomiting. Has not eaten much today. We discussed CT scan and possible need for additional biliary drain tomorrow if bilirubin does not significantly improved; she Buchanan agreeable. Legs about the same with IVF yesterday. Otherwise no changes.    Medications  Scheduled Meds:ampicillin /sulbactam (UNASYN ) 3 g in sodium chloride  0.9% (NS) 100 mL IVPB (MB+), 3 g, Intravenous, Q24H*  ERGOcalciferoL  (vitamin D2) (DRISDOL ) capsule 50,000 Units, 50,000 Units, Oral, Q7 Days  [Held by Provider] heparin  (porcine) PF syringe 5,000 Units, 5,000 Units, Subcutaneous, Q8H  pantoprazole  (PROTONIX ) injection 40 mg, 40 mg, Intravenous, API(88-78)  sodium chloride  0.9 % (flush) syringe 10 mL, 10 mL, Intra-catheter, BID  sodium chloride  0.9 % (flush) syringe 10 mL, 10 mL, Intra-catheter, BID    Continuous Infusions:  PRN and Respiratory Meds:acetaminophen  Q6H PRN, alteplase  BID PRN, alum-mag hydroxide-simeth Q4H PRN **AND** [EXPIRED] lidocaine  hcl viscous ONCE, calcium  carbonate Q6H PRN, cetirizine  QDAY PRN, diphenoxylate -atropine  QID PRN, fentaNYL  citrate PF Q3H while awake PRN, flumazeniL  PRN, hydrOXYzine  HCL Q6H PRN, loperamide  QID PRN, melatonin QHS PRN, nalOXone  PRN, ondansetron  Q6H PRN **OR** ondansetron  (ZOFRAN ) IV Q6H PRN, oxyCODONE  Q4H PRN, polyethylene glycol 3350  QDAY PRN, sennosides-docusate sodium  QDAY PRN, sodium chloride  0.9% TKO infusion PRN, trimethobenzamide  Q6H PRN        Objective:                          Vital Signs: Last Filed                 Vital Signs: 24 Hour Range   BP: 161/80 (06/18 0231)  Temp: 36.6 ?C (97.8 ?F) (06/18 0231)  Pulse: 73 (06/18 0231)  Respirations: 16 PER MINUTE (06/18 0231)  SpO2: 95 % (  06/18 0231)  O2 Device: None (Room air) (06/18 0231) BP: (125-177)/(62-80)   Temp:  [36.5 ?C (97.7 ?F)-37.2 ?C (98.9 ?F)]   Pulse:  [58-73]   Respirations:  [16 PER MINUTE-18 PER MINUTE]   SpO2:  [95 %-99 %]   O2 Device: None (Room air)     Vitals:    09/02/23 0500 09/04/23 0439 09/04/23 1000   Weight: 61.6 kg (135 lb 12.8 oz) 61.3 kg (135 lb 2.3 oz) 61.7 kg (136 lb)         Intake/Output Summary:  (Last 24 hours)    Intake/Output Summary (Last 24 hours) at 09/05/2023 0825  Last data filed at 09/05/2023 0309  Gross per 24 hour   Intake 1100.87 ml   Output 1705 ml   Net -604.13 ml           Physical Exam  Physical Exam  Vitals reviewed.   Pulmonary:      Effort: Pulmonary effort Buchanan normal.   Abdominal:      Comments: hepatic drain in place    Musculoskeletal:      Right lower leg: Edema present.      Left lower leg: Edema present.   Skin:     Coloration: Skin Buchanan jaundiced.   Neurological:      Mental Status: She Buchanan alert.           Lab Review  Pertinent labs reviewed and noted in A/P.     Point of Care Testing  (Last 24 hours)  Glucose: (!) 103 (09/05/23 0306)    Radiology and other Diagnostics Review:    Pertinent radiology reviewed and noted in A/P.

## 2023-09-05 NOTE — Progress Notes
 PHYSICAL THERAPY  NOTE      Name: Vicki Buchanan   MRN: [id number]     DOB: [birth year]      Age: 68 y.o.  Admission Date: 08/20/2023     LOS: 16 days     Date of Service: 09/05/2023    Attempt #1 (09:15): Patient supine upon arrival, patient requested that PT come back in the early afternoon. Will check back when available.     Attempt #2 (11:43): Patient supine upon arrival, patient declined physical therapy. Reported not have a great night and not much rest. Patient's family present at this time. Requested that PT come back in a few days. Will continue to follow up as indicated.     Therapist: Ai-Nhi Vivian Persia Lintner, SPT  Date: 09/05/2023

## 2023-09-05 NOTE — Unmapped
 Patient/family declined:  Bed/chair alarm and W/in arms reach during toileting/showering.    Patient/family educated on importance of intervention to their safety/quality of care. Patient/family continues to decline care.    Reason Why Patient/Family declined:  Patient has Sister at bedside who helps with ambulation and management of drains/tubes. Patient prefers to ambulate with family. Education provided.    Individualized safety and/or care plan implemented. If additional safety measures implemented, please list them.     Escalated to:  Unit coordinator/charge nurse.

## 2023-09-05 NOTE — Progress Notes
 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  Obstructive biliary tree; slender left intrahepatic ducts; tumor ingrowth into midportion of metal stent; LHD opacified with difficulty  08/03/2023 admitted to [facility] with acute cholangitis  Started Zosyn   08/04/2023 urine culture: >100K CFU Enterococcus faecalis (S) ampicillin   08/06/2023 underwent ERCP which revealed indwelling uncovered metal biliary stent in the left hepatic duct which was exchanged with plastic stent, mild stenosis within the metal stent at the proximal main bile duct, diffuse malignant stenoses of intrahepatic ducts; presence of purulence  08/07/2023 discontinued Zosyn ; started Augmentin ; discharged home  08/16/2023 underwent ERCP which revealed a protruding indwelling metallic stent in CBD seen coming out from ampulla with a plastic stent inside the metal stent, plastic stent was removed, occlusion cholangiogram: right posterior intrahepatic duct opacified; left hepatic system not opacified and totally; right anterior intrahepatic with branches partially opacified with significant narrowing beyond indwelling stent up to the hilum of liver; palced a Habiba catheter size 8 French, 2.7 and RFA x 2; large amount of tumor ingrowth inside the indwelling stent with large amount of tissue growing at the entrance of the stent which again this area also tumor destruction applied using RFA x 2; placed a 10 French, 10 cm double-pigtail plastic stent was successfully placed into the right intrahepatic duct; good outflow of bile and contrast seen through both the stents  08/20/2023 seen in oncology clinic [facility], noted to have persistent jaundice status post ERP and stent placement, received cefepime  x 1 dose  08/20/23: Directly admitted to the hospitalist service  Blood cultures: No growth x 2 sets  Urine culture: No growth  Started Zosyn   08/21/23 ERCP: uncovered metal stent in CBD extending out of ampulla with a double-pigtail plastic stent through the lumen of the uncovered metal stent, double-pigtail plastic stent removed, stone extraction balloon passed into left intrahepatic duct, balloon sweeps with extraction of a large amount of sludge, food debris and stent debris;  occlusion cholangiogram: Narrowing within the midportion of the stent consistent with tumor/tissue ingrowth.  RHD mildly dilated without stricture.  LHD opacified with difficulty.  Left intrahepatic ducts were slender and not well-visualized.  Palced 10 mm x 8 cm VIABIL stent within the previously placed uncovered biliary stent, upper end of the VIABIL stent was below the hilar confluence, good outflow of contrast after stent deployment   08/23/2023 CT ABD/pelvis: Development of small left pleural effusion and minimal basilar   bronchiolitis. No small bowel obstruction, ascites, or free air. Redemonstration of extensive partially calcified peritoneal metastatic disease. New small subcutaneous nodule right lateral pelvis which is probably a new metastasis. Right ischial pressure ulcer.   08/28/2023 IR: Placement of internal/external biliary drain (8 [place]) into the left ductal system  No meaningful drain output; progressive leukocytosis  08/30/2023 IR: Upsizing of internal/external biliary drain (8 Fr -> 12 Fr) within the left ductal system.  Some particulate matter identified on cholangiogram  - 6/12 biliary fluid GS: Moderate PMN, few budding yeast, few GPC in singles/pairs; routine culture + coag-negative Staphylococcus, anaerobic culture + Veillonella parvula  6/14 IR: Persistent biliary ductal dilation. Successful upsize to 58fr drain. 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 with subsequent removal of ureteral stents  02/2017 diagnosed on laparoscopy  04/03/2017 underwent loop ileostomy creation  12/2017 started epacadosta and sirolimus   05/2018 discontinued epacadosta and sirolimus   05/27/2020 underwent laparoscopic HIPEC with mitomycin-C (trial at [facility])  10/07/2018 underwent second laparoscopic HIPEC  09/12/2018 underwent tumor debulking [facility])   05/2020 underwent radiation therapy to right lung metastases  04/16/2020 underwent cytoreductive surgery with ostomy revision due to tumor invasion  09/2020 started FOLFIRI and bevacizumab    09/07/2021 MRI/MRCP: Slight apparent increase in diffuse intrahepatic and extrahepatic biliary ductal dilatation to the level of the porta hepatis where there is   extrinsic compression on the extrahepatic common duct from metastases. No   biliary filling defect to suggest choledocholithiasis or biliary mass. Grossly unchanged extensive peritoneal carcinomatosis and right abdominal wall metastases. Partially visualized indeterminate lesion in the left sacrum, which may   be a metastasis or hemangioma.   09/16/2021 underwent ERCP this month of an uncovered metal stent in the CBD  09/2021 held bevacizumab    08/04/2023 CT ABD/pelvis: Extensive metastatic disease throughout the abdomen and pelvis including peritoneal implants with mass effect on adjacent structures. Left posterior pleural implant with invasion of the chest wall and adjacent smaller nodules. Common bile duct and biliary stent in place. Moderate to intrahepatic biliary ductal dilatation. Right lower quadrant ostomy encased with implants. No gas-filled dilated loops of bowel to suggest obstruction. Mild left hydronephrosis with prominent left extrarenal pelvis. Diffuse bladder wall thickening with adjacent fat stranding, possibly secondary to acute cystitis. Correlation with urinalysis is recommended. Distended vaginal cuff with air-fluid level. The posterior wall of the urinary bladder is adjacent to the vaginal cuff without preserved fat plane of separation.   08/20/2023 CEA 2565.5  Currently FOLFIRI cycle 40     AKI over CKD  Baseline creatinine ~1.5  6/17 creatinine up to 3.71     Recurrent urinary tract infections     Pelvic abscess following tumor debulking  10/2018 abscess cultures: Serratia and Candida albicans  Underwent IR abscess drain placement  Received course of cefepime  and fluconazole      Asplenia, surgical  02/2018 Pneumovax   09/2018 PCV13 (Prevnar)   09/2018 Haemophilus influenzae type B   Meningococcal conjugate vaccine (Menveo) 09/30/2018 and 11/2018  Meningococcal serogroup B vaccine (MenB) 09/30/2018 and 10/28/2018    Recommendations:     Recent ERCPs confirmed further tumor growth with some obstruction of the existing metal stent within the CBD as well as a partially obstructed left hepatic ductal system, as evidenced on the cholangiogram.    Patient has not had fever but has developed progressive leukocytosis and had persistent elevation of hepatic transaminases, alkaline phosphatase and total bilirubin.  On 08/28/2023, she underwent placement of internal/external biliary drain into the left hepatic ductal system to decompress.  However, effectively no output from this new enteral/external biliary drain.  Drain was upsized 6/12 and 614 from 8Fr -> 18Fr catheter.  Now with better output.    Please continue ampicillin /sulbactam for now.  White blood cell count improved again today.  Await right-sided biliary drain placement tomorrow.  Duration of antibiotic therapy to be determined based on adequate decompression of the biliary tree and improvement of leukocytosis.  Monitor temp and WBC count.  We'll follow.    Interval Hx     Patient afebrile  Hemodynamically stable-BP elevated  On room air    Experienced decreased ostomy output and abdominal pain last evening  No nausea or vomiting  No fever, chills or sweating  No shortness of breath or cough  No dysuria or hematuria  IR consulted for right biliary drain placement tomorrow    Ileostomy output in the past 24 hours: 275 mL.      WBC 17.8-trending down  Hemoglobin 9.8  Platelets 133  Creatinine 3.67-slightly better  AST 104-increased  ALT 66-increased  ALP 210  T. bili 16-increased again    Drain output over previous 24 hours:  Left internal/external biliary drain: 810 mL     6/16 LLE US /Doppler negative for DVT.    Antimicrobial Start date End date   Cefepime  6/2 6/2   Pip/tazo 6/2 6/6   Amp/sulb 6/11 Active                       Estimated Creatinine Clearance: 12.7 mL/min (A) (based on SCr of 3.67 mg/dL (H)).    Medications   Scheduled Meds:ampicillin /sulbactam (UNASYN ) 3 g in sodium chloride  0.9% (NS) 100 mL IVPB (MB+), 3 g, Intravenous, Q24H*  ERGOcalciferoL  (vitamin D2) (DRISDOL ) capsule 50,000 Units, 50,000 Units, Oral, Q7 Days  [Held by Provider] heparin  (porcine) PF syringe 5,000 Units, 5,000 Units, Subcutaneous, Q8H  pantoprazole  (PROTONIX ) injection 40 mg, 40 mg, Intravenous, API(88-78)  sodium chloride  0.9 % (flush) syringe 10 mL, 10 mL, Intra-catheter, BID  sodium chloride  0.9 % (flush) syringe 10 mL, 10 mL, Intra-catheter, BID    Continuous Infusions:      PRN and Respiratory Meds:acetaminophen  Q6H PRN, alteplase  BID PRN, alum-mag hydroxide-simeth Q4H PRN **AND** [EXPIRED] lidocaine  hcl viscous ONCE, calcium  carbonate Q6H PRN, cetirizine  QDAY PRN, diphenoxylate -atropine  QID PRN, fentaNYL  citrate PF Q3H while awake PRN, flumazeniL  PRN, hydrOXYzine  HCL Q6H PRN, loperamide  QID PRN, melatonin QHS PRN, nalOXone  PRN, ondansetron  Q6H PRN **OR** ondansetron  (ZOFRAN ) IV Q6H PRN, oxyCODONE  Q4H PRN, polyethylene glycol 3350  QDAY PRN, sennosides-docusate sodium  QDAY PRN, sodium chloride  0.9% TKO infusion PRN, trimethobenzamide  Q6H PRN      Allergies     Allergies   Allergen Reactions    Indomethacin  SEE COMMENTS     Pain for several days after suppository post ERCP. Tolerated IV but not PR    Shrimp SEE COMMENTS     Eye irritation. Namely blood vessel issues in the eyes        Physical Examination                          Vital Signs: Last                  Vital Signs: 24 Hour Range   BP: 161/80 (06/18 0231)  Temp: 36.6 ?C (97.8 ?F) (06/18 0231)  Pulse: 73 (06/18 0231)  Respirations: 16 PER MINUTE (06/18 0231)  SpO2: 95 % (06/18 0231)  O2 Device: None (Room air) (06/18 0231) BP: (125-177)/(62-80)   Temp:  [36.5 ?C (97.7 ?F)-37.2 ?C (98.9 ?F)]   Pulse:  [58-73]   Respirations:  [16 PER MINUTE-18 PER MINUTE]   SpO2:  [95 %-99 %]   O2 Device: None (Room air)     Gen: Awake, alert, NAD, fatigued appearing  HEENT: EOMI, scleral icterus present, no visible thrush  Lungs: Clear breath sounds bilaterally  Heart: Regular, no murmur  Abd: +  BS, soft, tenderness to palpation in the mid upper abdomen, moderately distended, left internal/external biliary drain draining bilious fluid a lot of sediment in the bag, ostomy intact  Ext: 1+ bilateral lower extremity edema, tenderness to palpation of the left calf  Skin: Jaundice, no acute rash  Musculoskel: No warm or swollen joints  Psych: Oriented x 3 with normal mood and affect    Lines: Port-A-cath accessed, unremarkable aside    Drains/tubes:  Ileostomy RLQ  Left int/ext biliary drain     Lab Review   Hematology  Recent Labs     09/03/23  0300 09/04/23  0437 09/05/23  0306   WBC 29.40* 21.00* 17.80*   HGB 7.4* 6.9* 9.8*   HCT 21.5* 19.6* 27.9*   PLTCT 147* 128* 133*   INR 1.3* 1.2 1.2     Chemistry  Recent Labs     09/03/23  0300 09/04/23  0437 09/05/23  0306   NA 136* 135* 136*   K 4.3 3.9 3.7   CL 101 100 101   CO2 26 25 22    BUN 31* 32* 32*   CR 3.45* 3.71* 3.67* GFR 14* 13* 13*   GLU 78 138* 103*   CA 7.7* 7.5* 8.1*   PO4 3.3 3.5 2.8   ALBUMIN  2.0* 1.9* 2.1*   ALKPHOS 238* 214* 210*   AST 70* 74* 104*   ALT 52 54 66*   TOTBILI 15.4* 13.7* 16.0*       Microbiology, Radiology and other Diagnostics Review   Microbiology data reviewed.    Pertinent radiology images viewed.       Clem JINNY Sheen, MD   Contact via [facility]  Transplant & Immunocompromised Infectious Diseases

## 2023-09-05 NOTE — Unmapped
 Patient/family declined:  Bed/chair alarm.    Patient/family educated on importance of intervention to their safety/quality of care. Patient/family continues to decline care.    Reason Why Patient/Family declined:  pt prefers to ambulate independently with family in the room. Education provided    Individualized safety and/or care plan implemented. If additional safety measures implemented, please list them.     Escalated to:  Unit coordinator/charge nurse.

## 2023-09-06 LAB — MANUAL DIFF
~~LOC~~ BKR EOSINOPHILS - RELATIVE: 2 % (ref 0–5)
~~LOC~~ BKR LYMPHOCYTES - RELATIVE: 6 % — ABNORMAL LOW (ref 24–44)
~~LOC~~ BKR MONOCYTES - RELATIVE: 12 % (ref 4–12)
~~LOC~~ BKR NEUT+BANDS - ABSOLUTE: 12 10*3/uL — ABNORMAL HIGH (ref 1.8–7.0)
~~LOC~~ BKR NEUTROPHILS - RELATIVE: 80 % — ABNORMAL HIGH (ref 41–77)

## 2023-09-06 LAB — CULTURE-ANAEROBIC

## 2023-09-06 NOTE — Progress Notes
 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 cholangitis  Obstructive biliary tree; slender left intrahepatic ducts; tumor ingrowth into midportion of metal stent; LHD opacified with difficulty  08/03/2023 admitted to [facility] with acute cholangitis  Started Zosyn   08/04/2023 urine culture: >100K CFU Enterococcus faecalis (S) ampicillin   08/06/2023 underwent ERCP which revealed indwelling uncovered metal biliary stent in the left hepatic duct which was exchanged with plastic stent, mild stenosis within the metal stent at the proximal main bile duct, diffuse malignant stenoses of intrahepatic ducts; presence of purulence  08/07/2023 discontinued Zosyn ; started Augmentin ; discharged home  08/16/2023 underwent ERCP which revealed a protruding indwelling metallic stent in CBD seen coming out from ampulla with a plastic stent inside the metal stent, plastic stent was removed, occlusion cholangiogram: right posterior intrahepatic duct opacified; left hepatic system not opacified and totally; right anterior intrahepatic with branches partially opacified with significant narrowing beyond indwelling stent up to the hilum of liver; palced a Habiba catheter size 8 French, 2.7 and RFA x 2; large amount of tumor ingrowth inside the indwelling stent with large amount of tissue growing at the entrance of the stent which again this area also tumor destruction applied using RFA x 2; placed a 10 French, 10 cm double-pigtail plastic stent was successfully placed into the right intrahepatic duct; good outflow of bile and contrast seen through both the stents  08/20/2023 seen in oncology clinic [facility], noted to have persistent jaundice status post ERP and stent placement, received cefepime  x 1 dose  08/20/23: Directly admitted to the hospitalist service  Blood cultures: No growth x 2 sets  Urine culture: No growth  Started Zosyn   08/21/23 ERCP: uncovered metal stent in CBD extending out of ampulla with a double-pigtail plastic stent through the lumen of the uncovered metal stent, double-pigtail plastic stent removed, stone extraction balloon passed into left intrahepatic duct, balloon sweeps with extraction of a large amount of sludge, food debris and stent debris;  occlusion cholangiogram: Narrowing within the midportion of the stent consistent with tumor/tissue ingrowth.  RHD mildly dilated without stricture.  LHD opacified with difficulty.  Left intrahepatic ducts were slender and not well-visualized.  Palced 10 mm x 8 cm VIABIL stent within the previously placed uncovered biliary stent, upper end of the VIABIL stent was below the hilar confluence, good outflow of contrast after stent deployment   08/23/2023 CT ABD/pelvis: Development of small left pleural effusion and minimal basilar   bronchiolitis. No small bowel obstruction, ascites, or free air. Redemonstration of extensive partially calcified peritoneal metastatic disease. New small subcutaneous nodule right lateral pelvis which is probably a new metastasis. Right ischial pressure ulcer.   08/28/2023 IR: Placement of internal/external biliary drain (8 [place]) into the left ductal system  No meaningful drain output; progressive leukocytosis  08/30/2023 IR: Upsizing of internal/external biliary drain (8 Fr -> 12 Fr) within the left ductal system.  Some particulate matter identified on cholangiogram  - 6/12 biliary fluid GS: Moderate PMN, few budding yeast, few GPC in singles/pairs; routine culture + coag-negative Staphylococcus, anaerobic culture + Veillonella parvula  6/14 IR: Persistent biliary ductal dilation. Successful upsize to 20fr drain. Additional side holes cut in catheter to accommodate length of indwelling stent  6/14 biliary drain fluid  GS: Rare PMN, moderate budding yeast; routine culture + coag-negative Staphylococcus; anaerobic culture + Veillonella parvula  6/18 CT abdomen/pelvis w/o: Fluid overload, as evidenced by increasing anasarca, bilateral effusions, mesenteric congestion, and trace ascites.  No high-grade bowel obstruction is suspected.  Mildly distended stomach.  Extensive peritoneal carcinomatosis, including in the region of ostomy.  Left-sided internal/external biliary stent traversing through previously noted, bile duct stent.  Persistent mild to moderate intrahepatic biliary dilatation, left greater than right.     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 with subsequent removal of ureteral stents  02/2017 diagnosed on laparoscopy  04/03/2017 underwent loop ileostomy creation  12/2017 started epacadosta and sirolimus   05/2018 discontinued epacadosta and sirolimus   05/27/2020 underwent laparoscopic HIPEC with mitomycin-C (trial at [facility])  10/07/2018 underwent second laparoscopic HIPEC  09/12/2018 underwent tumor debulking [facility])   05/2020 underwent radiation therapy to right lung metastases  04/16/2020 underwent cytoreductive surgery with ostomy revision due to tumor invasion  09/2020 started FOLFIRI and bevacizumab    09/07/2021 MRI/MRCP: Slight apparent increase in diffuse intrahepatic and extrahepatic biliary ductal dilatation to the level of the porta hepatis where there is   extrinsic compression on the extrahepatic common duct from metastases. No   biliary filling defect to suggest choledocholithiasis or biliary mass. Grossly unchanged extensive peritoneal carcinomatosis and right abdominal wall metastases. Partially visualized indeterminate lesion in the left sacrum, which may   be a metastasis or hemangioma.   09/16/2021 underwent ERCP this month of an uncovered metal stent in the CBD  09/2021 held bevacizumab    08/04/2023 CT ABD/pelvis: Extensive metastatic disease throughout the abdomen and pelvis including peritoneal implants with mass effect on adjacent structures. Left posterior pleural implant with invasion of the chest wall and adjacent smaller nodules. Common bile duct and biliary stent in place. Moderate to intrahepatic biliary ductal dilatation. Right lower quadrant ostomy encased with implants. No gas-filled dilated loops of bowel to suggest obstruction. Mild left hydronephrosis with prominent left extrarenal pelvis. Diffuse bladder wall thickening with adjacent fat stranding, possibly secondary to acute cystitis. Correlation with urinalysis is recommended. Distended vaginal cuff with air-fluid level. The posterior wall of the urinary bladder is adjacent to the vaginal cuff without preserved fat plane of separation.   08/20/2023 CEA 2565.5  Currently FOLFIRI cycle 40     AKI over CKD  Baseline creatinine ~1.5  6/17 creatinine up to 3.71     Recurrent urinary tract infections     Pelvic abscess following tumor debulking  10/2018 abscess cultures: Serratia and Candida albicans  Underwent IR abscess drain placement  Received course of cefepime  and fluconazole      Asplenia, surgical  02/2018 Pneumovax   09/2018 PCV13 (Prevnar)   09/2018 Haemophilus influenzae type B   Meningococcal conjugate vaccine (Menveo) 09/30/2018 and 11/2018  Meningococcal serogroup B vaccine (MenB) 09/30/2018 and 10/28/2018    Recommendations:     Recent ERCPs confirmed further tumor growth with some obstruction of the existing metal stent within the CBD as well as a partially obstructed left hepatic ductal system, as evidenced on the cholangiogram.    Patient has not had fever but has developed progressive leukocytosis and had persistent elevation of hepatic transaminases, alkaline phosphatase and total bilirubin.  On 08/28/2023, she underwent placement of internal/external biliary drain into the left hepatic ductal system to decompress.  However, effectively no output from this new enteral/external biliary drain.  Drain  was upsized 6/12 and 614 from 8Fr -> 18Fr catheter.  Now with better output.  Bilirubin increased again.    Please continue ampicillin /sulbactam for now.  White blood cell count continues to improve.  Await decision regarding right-sided biliary drain placement tomorrow.  Duration of antibiotic therapy to be determined based on adequate decompression of the biliary tree and improvement of leukocytosis.  Monitor temp and WBC count.    We'll follow.    Interval Hx     Patient afebrile  Hemodynamically stable  On room air    Feeling better today  Ostomy has been working well since yesterday afternoon  No nausea or vomiting  No abdominal pain  No fever, chills or sweating  No shortness of breath or cough  No dysuria or hematuria    Ileostomy output in the past 24 hours: 430 mL.      WBC 15.4-trending down  Hemoglobin 8.7  Platelets 132  Creatinine 3.72  AST 100  ALT 62  ALP 181  T. bili 14.5-slightly better    Drain output over previous 24 hours:  Left internal/external biliary drain: 920 mL (flushed with 30 mL)      Antimicrobial Start date End date   Cefepime  6/2 6/2   Pip/tazo 6/2 6/6   Amp/sulb 6/11 Active                       Estimated Creatinine Clearance: 12.5 mL/min (A) (based on SCr of 3.72 mg/dL (H)).    Medications   Scheduled Meds:ampicillin /sulbactam (UNASYN ) 3 g in sodium chloride  0.9% (NS) 100 mL IVPB (MB+), 3 g, Intravenous, Q24H*  ERGOcalciferoL  (vitamin D2) (DRISDOL ) capsule 50,000 Units, 50,000 Units, Oral, Q7 Days  [Held by Provider] heparin  (porcine) PF syringe 5,000 Units, 5,000 Units, Subcutaneous, Q8H  pantoprazole  (PROTONIX ) injection 40 mg, 40 mg, Intravenous, API(88-78)  sodium chloride  0.9 % (flush) syringe 10 mL, 10 mL, Intra-catheter, BID  sodium chloride  0.9 % (flush) syringe 10 mL, 10 mL, Intra-catheter, BID    Continuous Infusions:      PRN and Respiratory Meds:acetaminophen  Q6H PRN, alteplase  BID PRN, alum-mag hydroxide-simeth Q4H PRN **AND** [EXPIRED] lidocaine  hcl viscous ONCE, calcium  carbonate Q6H PRN, cetirizine  QDAY PRN, diphenoxylate -atropine  QID PRN, fentaNYL  citrate PF Q3H while awake PRN, flumazeniL  PRN, hydrOXYzine  HCL Q6H PRN, loperamide  QID PRN, melatonin QHS PRN, nalOXone  PRN, ondansetron  Q6H PRN **OR** ondansetron  (ZOFRAN ) IV Q6H PRN, oxyCODONE  Q4H PRN, polyethylene glycol 3350  QDAY PRN, sennosides-docusate sodium  QDAY PRN, sodium chloride  0.9% TKO infusion PRN, trimethobenzamide  Q6H PRN      Allergies     Allergies   Allergen Reactions    Indomethacin  SEE COMMENTS     Pain for several days after suppository post ERCP. Tolerated IV but not PR    Shrimp SEE COMMENTS     Eye irritation. Namely blood vessel issues in the eyes        Physical Examination                          Vital Signs: Last                  Vital Signs: 24 Hour Range   BP: 127/81 (06/19 0746)  Temp: 36.4 ?C (97.5 ?F) (06/19 9253)  Pulse: 87 (06/19 0746)  Respirations: 16 PER MINUTE (06/19 0414)  SpO2: 98 % (06/19 0746)  O2 Device: None (Room air) (06/19 0746) BP: (111-139)/(61-81)   Temp:  [36.4 ?C (97.5 ?  F)-36.7 ?C (98 ?F)]   Pulse:  [65-87]   Respirations:  [16 PER MINUTE]   SpO2:  [95 %-98 %]   O2 Device: None (Room air)     Gen: Awake, alert, NAD, fatigued appearing  HEENT: EOMI, scleral icterus present, no visible thrush  Lungs: Clear breath sounds bilaterally  Heart: Regular, no murmur  Abd: + BS, soft, no tenderness to palpation, moderately distended, left internal/external biliary drain draining bilious fluid, ostomy intact  Ext: 1+ bilateral lower extremity edema, tenderness to palpation of the left calf  Skin: Jaundice, no acute rash  Musculoskel: No warm or swollen joints  Psych: Oriented x 3 with normal mood and affect    Lines: Port-A-cath accessed, unremarkable aside    Drains/tubes:  Ileostomy RLQ  Left int/ext biliary drain     Lab Review   Hematology  Recent Labs     09/04/23  0437 09/05/23  0306 09/06/23  0226   WBC 21.00* 17.80* 15.40*   HGB 6.9* 9.8* 8.7*   HCT 19.6* 27.9* 25.1*   PLTCT 128* 133* 132*   INR 1.2 1.2 1.2     Chemistry  Recent Labs     09/04/23  0437 09/05/23  0306 09/06/23  0226   NA 135* 136* 135*   K 3.9 3.7 3.8   CL 100 101 103   CO2 25 22 22    BUN 32* 32* 34*   CR 3.71* 3.67* 3.72*   GFR 13* 13* 13*   GLU 138* 103* 78   CA 7.5* 8.1* 8.0*   PO4 3.5 2.8 3.8   ALBUMIN  1.9* 2.1* 2.1*   ALKPHOS 214* 210* 181*   AST 74* 104* 100*   ALT 54 66* 62*   TOTBILI 13.7* 16.0* 14.5*       Microbiology, Radiology and other Diagnostics Review   Microbiology data reviewed.    Pertinent radiology images viewed.       Clem JINNY Sheen, MD   Contact via [facility]  Transplant & Immunocompromised Infectious Diseases

## 2023-09-06 NOTE — Progress Notes
 General Medicine Daily Progress Note    Name: Vicki Buchanan        MRN: [id number]          DOB: 04-27-1955            Age: 68 y.o.  Admission Date: 08/20/2023       LOS: 17 days    Date of Service: 09/06/2023    Assessment/Plan:    Principal Problem:    Cholangitis (CMS-HCC)  Active Problems:    Peritoneal carcinomatosis (CMS-HCC)    Cancer of appendix (CMS-HCC)    Hyperbilirubinemia    Moderate malnutrition      Vicki Buchanan is a 43yoF with metastatic mucinous adenocarcinoma of the appendix (peritoneal mets, TAH/BSO, diverting loop ileostomy, malignant biliary obstruction) who presented 08/20/23 with anemia, hyperbilirubinemia, and leukocytosis and was admitted with cholangitis.     Mucinous adenocarcinoma of the appendix  hyperbilirubinemia  malignant biliary obstruction  ascending cholangitis   post ERCP pancreatitis   - admitted with rising bili, leukocytosis, anemia  - oncology consulted, appreciate assistance  - recently hospitalized at [facility] with pus noted to be coming out of her bile ducts during ERCP 08/06/23  - biliary consulted, s/p ERCP 08/21/23: Uncovered metal stent coming out of the ampulla with double-pigtail plastic stent going through the lumen of the metal stent.  Food and stent debris seen in stent lumen.  Occlusion cholangiogram performed that showed narrowing in the midportion of the stent consistent with tumor/tissue ingrowth.  Right hepatic duct mildly dilated without any obvious stricture.  Left intrahepatic ducts normal-appearing.  A 10 mm x 8 cm viabil stent was deployed within the previously placed uncovered biliary stent with good outflow of contrast.   - CT A/P 6/5:   Development of small left pleural effusion and minimal basilar bronchiolitis.  No small bowel obstruction, ascites, or free air.  Redemonstration of extensive partially calcified peritoneal metastatic disease.   New small subcutaneous nodule right lateral pelvis which is probably a new metastasis.   Right ischial pressure ulcer.   - abdominal pain, elevated lipase post ERCP: post-procedure pancreatitis  Plan  - ID consulted, on unasyn . Duration to be determined by WBC and liver enzymes/bilirubin. 6/19 continue for now, WBC improving  -Status post placement of 8 French internal/external biliary drain via a dilated peripheral left duct.  6/14 upsized to 18 [place] biliary drain due to concern for malfunction.  6/18 bilirubin rising but improved 6/19. Pending total bili 6/20, tentatively on IR schedule for second drain placement   - Biliary drain should be placed to gravity drainage until bilirubin normalizes and flushed with 10 mL normal saline twice a day.  Patient follow-up in 3 months for routine preventive maintenance of biliary catheters.    Decreased ostomy output  > Repeat CT A/P wo contrast 6/18 to rule out obstruction -- no evidence of obstruction, CTM and enemas as needed      Anemia  > 1 unit prbcs ordered on 6/7.  > 6/17 Hgb dropped to 6.9, 1 unit ordered. 6/18 improved      AKI on CKD4  chronic mild L hydronephrosis   - follows with Dr. Marlo as outpatient  Plan  > Continue IVF for goal net even to slightly positive. 6/18 give 2L IVF @100mL /hr  > Nephrology following     LLE pain  > LLE doppler neg for DVT     GERD  - continue PPI  - add PRN GI cocktail  FEN:   Fluids: LR  Monitor and replace electrolytes as needed  Diet: cardiac   DVT PPX: heparin  SQ held with drop in hgb      CODE STATUS: FULL CODE   NOK: husband     Disposition:  continue admission. Home on discharge pending WBC and bilirubin, abx plan, Cr.       Malnutrition Details:  Malnutrition present  ICD-10 code E44: Acute illness/Moderate non-severe malnutrition  Mild loss of body fat, Mild loss of muscle mass, Energy Intake: Less than 75% of estimated energy requirement for greater than 7days            Loss of Subcutaneous Fat: Yes Mild Triceps  Muscle Wasting: Yes Mild Clavicle               Malnutrition Interventions: Encouraged small frequent meals, reviewed meal ideas, encouraged Ensure supplements      Wound:                   Brian Prime, MD  Hospitalist, Internal Medicine    Voalte is the preferred method of communication.   Please use the Med Private First Call for all patient-related communications. Personal Voaltes and pagers are not answered at all hours.      High medical decision making due to the following:  independent interpretation of a test (CBC, CMP)  and discussion of management or test interpretation with IR (physician(s) or other qualified health care professional outside of my specialty)  drug therapy requiring intensive monitoring for toxicity(IV abx)        _______________________________________________________________________    Subjective  Vicki Buchanan is a 68 y.o. female.  Seen at bedside, sister, husband, daughter present. Feeling a lot better today. Wondering if we can see what bili is tomorrow before proceeding with second drain. Having some ostomy output. Eating a little more than yesterday morning. Otherwise no changes.     Medications  Scheduled Meds:ampicillin /sulbactam (UNASYN ) 3 g in sodium chloride  0.9% (NS) 100 mL IVPB (MB+), 3 g, Intravenous, Q24H*  ERGOcalciferoL  (vitamin D2) (DRISDOL ) capsule 50,000 Units, 50,000 Units, Oral, Q7 Days  [Held by Provider] heparin  (porcine) PF syringe 5,000 Units, 5,000 Units, Subcutaneous, Q8H  pantoprazole  (PROTONIX ) injection 40 mg, 40 mg, Intravenous, API(88-78)  sodium chloride  0.9 % (flush) syringe 10 mL, 10 mL, Intra-catheter, BID  sodium chloride  0.9 % (flush) syringe 10 mL, 10 mL, Intra-catheter, BID    Continuous Infusions:  PRN and Respiratory Meds:acetaminophen  Q6H PRN, alteplase  BID PRN, alum-mag hydroxide-simeth Q4H PRN **AND** [EXPIRED] lidocaine  hcl viscous ONCE, calcium  carbonate Q6H PRN, cetirizine  QDAY PRN, diphenoxylate -atropine  QID PRN, fentaNYL  citrate PF Q3H while awake PRN, flumazeniL  PRN, hydrOXYzine  HCL Q6H PRN, loperamide  QID PRN, melatonin QHS PRN, nalOXone  PRN, ondansetron  Q6H PRN **OR** ondansetron  (ZOFRAN ) IV Q6H PRN, oxyCODONE  Q4H PRN, polyethylene glycol 3350  QDAY PRN, sennosides-docusate sodium  QDAY PRN, sodium chloride  0.9% TKO infusion PRN, trimethobenzamide  Q6H PRN        Objective:                          Vital Signs: Last Filed                 Vital Signs: 24 Hour Range   BP: 127/81 (06/19 0746)  Temp: 36.4 ?C (97.5 ?F) (06/19 9253)  Pulse: 87 (06/19 0746)  Respirations: 16 PER MINUTE (06/19 0414)  SpO2: 98 % (06/19 0746)  O2 Device: None (Room air) (06/19 0746) BP: (111-139)/(61-81)  Temp:  [36.4 ?C (97.5 ?F)-36.7 ?C (98 ?F)]   Pulse:  [65-87]   Respirations:  [16 PER MINUTE]   SpO2:  [95 %-98 %]   O2 Device: None (Room air)   Intensity Pain Scale (Self Report): 3 (09/05/23 2111) Vitals:    09/04/23 1000 09/05/23 1636 09/06/23 0431   Weight: 61.7 kg (136 lb) 62.1 kg (137 lb) 61.3 kg (135 lb 3.2 oz)         Intake/Output Summary:  (Last 24 hours)    Intake/Output Summary (Last 24 hours) at 09/06/2023 0825  Last data filed at 09/06/2023 0431  Gross per 24 hour   Intake 380 ml   Output 1630 ml   Net -1250 ml           Physical Exam  Physical Exam  Vitals reviewed.   Pulmonary:      Effort: Pulmonary effort is normal.   Abdominal:      Comments: hepatic drain in place    Musculoskeletal:      Right lower leg: Edema present.      Left lower leg: Edema present.   Skin:     Coloration: Skin is jaundiced.   Neurological:      Mental Status: She is alert.           Lab Review  Pertinent labs reviewed and noted in A/P.     Point of Care Testing  (Last 24 hours)  Glucose: 78 (09/06/23 0226)    Radiology and other Diagnostics Review:    Pertinent radiology reviewed and noted in A/P.

## 2023-09-06 NOTE — Progress Notes
 Reason for Visit:  Chaplain rounding on unit, checking in after long length of stay.    Faith/Religion: Chesnie is [place] and feels deeply connected to God throughout this. She states that God's got this! And feels like her faith has always been strong but had never been tested before this diagnosis 7 years ago. She is surrounded by prayer warriors and feels their love and support every day. She had turned over her experiences to God and trusts in God's timing for all things.      Source of Purpose/Meaning:  Lurline speaks of her diagnosis as a gift in that it opened her eyes to what is most important, mainly her family. She loves and is loved well by her sister, her husband, their two adult children, and their four grandchildren.     Worries/Concerns/Struggles:    This was not a topic Lashanta spent time on. There is awareness of changes in her that have brought her back to the hospital, but she remains in a place of hope and positivity without avoiding the hardness of this journey.    Method(s) of Coping: Her positive outlook, her family who come when they are needed and stay in regular touch via FaceTime, etc. When they are not physically near, her prayer life and that of those who support her, and passing on encouragement to others diagnosed all bring her joy and resilience.     Support System:  As stated above, Neala's family, church family, community, and so many others are close to her and encourage her. Her 69yo MIL always greet's people with I'm blessed! And that legacy is present in [place] and in the whole room.     Interventions/Plan: It was a lovely visit today, and I offered assurance and affirmation of Loyal's outlook and the energy of those in the room to meet it. She had no spiritual needs she expressed today, though further care was encouraged and I informed them of chaplain availability should their needs change.     Chaplain Alfonso Mate, [facility] MDIV, MTS, CBC Provider  Voalte/AMS  Desk: [phone]  Pager: [phone]    09/06/2023 1:28 PM    The On-Call Chaplain is available on Voalte or can be paged via the switchboard [phone]) for urgent and emergent needs.   The Spiritual Care team responds to other requests within 24-hours when submitted as a Chaplain Consult in O2.

## 2023-09-06 NOTE — Unmapped
 Patient/family declined:  Bed/chair alarm, W/in arms reach during toileting/showering, and Ambulation.    Patient/family educated on importance of intervention to their safety/quality of care. Patient/family continues to decline care.    Reason Why Patient/Family declined:  Patient wishes to ambulate independently with families assistance.    Individualized safety and/or care plan implemented. If additional safety measures implemented, please list them.     Escalated to:  Unit coordinator/charge nurse.

## 2023-09-07 ENCOUNTER — Encounter: Admit: 2023-09-07 | Discharge: 2023-09-07 | Payer: MEDICARE

## 2023-09-07 ENCOUNTER — Inpatient Hospital Stay: Admit: 2023-09-07 | Discharge: 2023-09-07 | Payer: MEDICARE

## 2023-09-07 LAB — MANUAL DIFF
~~LOC~~ BKR EOSINOPHILS - RELATIVE: 2 % (ref 0–5)
~~LOC~~ BKR LYMPHOCYTES - RELATIVE: 12 % — ABNORMAL LOW (ref 24–44)
~~LOC~~ BKR MONOCYTES - RELATIVE: 5 % (ref 4–12)
~~LOC~~ BKR NEUT+BANDS - ABSOLUTE: 11 10*3/uL — ABNORMAL HIGH (ref 1.8–7.0)
~~LOC~~ BKR NEUTROPHILS - RELATIVE: 81 % — ABNORMAL HIGH (ref 41–77)

## 2023-09-07 LAB — HEMOGLOBIN & HEMATOCRIT
~~LOC~~ BKR HEMATOCRIT: 22 % — ABNORMAL LOW (ref 36.0–45.0)
~~LOC~~ BKR HEMOGLOBIN: 7.6 g/dL — ABNORMAL LOW (ref 12.0–15.0)

## 2023-09-07 MED ORDER — HYDROCORTISONE SOD SUCC (PF) 100 MG/2 ML IJ SOLR
50 mg | INTRAVENOUS | 0 refills | Status: DC
Start: 2023-09-07 — End: 2023-09-08
  Administered 2023-09-08 (×3): 50 mg via INTRAVENOUS

## 2023-09-07 MED ORDER — DIPHENHYDRAMINE HCL 50 MG/ML IJ SOLN
25 mg | Freq: Once | INTRAVENOUS | 0 refills | Status: CP
Start: 2023-09-07 — End: ?
  Administered 2023-09-07: 15:00:00 25 mg via INTRAVENOUS

## 2023-09-07 MED ORDER — LACTATED RINGERS IV SOLP
Freq: Once | INTRAVENOUS | 0 refills | Status: DC
Start: 2023-09-07 — End: 2023-09-07

## 2023-09-07 MED ORDER — SODIUM CHLORIDE 0.9% IV SOLP
0 refills | Status: CP
Start: 2023-09-07 — End: ?

## 2023-09-07 MED ORDER — NALOXONE 0.4 MG/ML IJ SOLN
.4 mg | INTRAVENOUS | 0 refills | Status: DC | PRN
Start: 2023-09-07 — End: 2023-09-08
  Administered 2023-09-07 – 2023-09-08 (×2): 0.4 mg via INTRAVENOUS

## 2023-09-07 MED ORDER — FENTANYL CITRATE (PF) 50 MCG/ML IJ SOLN
0 refills | Status: CP
Start: 2023-09-07 — End: ?

## 2023-09-07 MED ORDER — SODIUM CHLORIDE 0.9% IV BOLUS
500 mL | Freq: Once | INTRAVENOUS | 0 refills | Status: AC
Start: 2023-09-07 — End: ?

## 2023-09-07 MED ORDER — LACTATED RINGERS IV BOLUS
2000 mL | Freq: Once | INTRAVENOUS | 0 refills | Status: DC
Start: 2023-09-07 — End: 2023-09-08
  Administered 2023-09-07: 23:00:00 2000 mL via INTRAVENOUS

## 2023-09-07 MED ORDER — PIPERACILLIN/TAZOBACTAM 4.5 G/100ML NS IVPB (MB+)
4.5 g | Freq: Two times a day (BID) | INTRAVENOUS | 0 refills | Status: DC
Start: 2023-09-07 — End: 2023-09-08
  Administered 2023-09-08 (×4): 4.5 g via INTRAVENOUS

## 2023-09-07 MED ORDER — IOHEXOL 300 MG IODINE/ML IV SOLN
40 mL | Freq: Once | 0 refills | Status: CP
Start: 2023-09-07 — End: ?
  Administered 2023-09-07: 17:00:00 40 mL

## 2023-09-07 MED ORDER — NOREPINEPHRINE BITARTRATE-D5W 4 MG/250 ML (16 MCG/ML) IV SOLN
0-.5 ug/kg/min | INTRAVENOUS | 0 refills | Status: DC
Start: 2023-09-07 — End: 2023-09-08
  Administered 2023-09-08: 14:00:00 0.26 ug/kg/min via INTRAVENOUS
  Administered 2023-09-08: 02:00:00 0.24 ug/kg/min via INTRAVENOUS

## 2023-09-07 MED ORDER — MIDAZOLAM 1 MG/ML IJ SOLN
1 mg | Freq: Once | INTRAVENOUS | 0 refills | Status: DC
Start: 2023-09-07 — End: 2023-09-07

## 2023-09-07 MED ORDER — LACTATED RINGERS IV SOLP
INTRAVENOUS | 0 refills | Status: DC
Start: 2023-09-07 — End: 2023-09-07

## 2023-09-07 MED ORDER — MIDAZOLAM 1 MG/ML IJ SOLN
2 mg | Freq: Once | INTRAVENOUS | 0 refills | Status: CP
Start: 2023-09-07 — End: ?
  Administered 2023-09-07: 15:00:00 2 mg via INTRAVENOUS

## 2023-09-07 MED ORDER — VASOPRESSIN IN 0.9 % SOD CHLOR 20 UNIT/100 ML (0.2 UNIT/ML) IV SOLN
1.8 [IU]/h | INTRAVENOUS | 0 refills | Status: DC
Start: 2023-09-07 — End: 2023-09-09
  Administered 2023-09-08 (×2): 1.8 [IU]/h via INTRAVENOUS

## 2023-09-07 MED ORDER — ALBUMIN, HUMAN 25 % IV SOLP
25 g | Freq: Once | INTRAVENOUS | 0 refills | Status: CP
Start: 2023-09-07 — End: ?
  Administered 2023-09-08: 01:00:00 25 g via INTRAVENOUS

## 2023-09-07 MED ORDER — ACETAMINOPHEN 1,000 MG/100 ML (10 MG/ML) IV SOLN
1000 mg | Freq: Once | INTRAVENOUS | 0 refills | Status: CP
Start: 2023-09-07 — End: ?
  Administered 2023-09-07: 19:00:00 1000 mg via INTRAVENOUS

## 2023-09-07 MED ORDER — LINEZOLID IN DEXTROSE 5% 600 MG/300 ML IV PGBK
600 mg | Freq: Two times a day (BID) | INTRAVENOUS | 0 refills | Status: DC
Start: 2023-09-07 — End: 2023-09-08
  Administered 2023-09-08 (×2): 600 mg via INTRAVENOUS

## 2023-09-07 MED ORDER — MIDAZOLAM 1 MG/ML IJ SOLN
0 refills | Status: CP
Start: 2023-09-07 — End: ?

## 2023-09-07 MED ORDER — SODIUM CHLORIDE 0.9% IV BOLUS
500 mL | Freq: Once | INTRAVENOUS | 0 refills | Status: CP
Start: 2023-09-07 — End: ?
  Administered 2023-09-08: 02:00:00 500 mL via INTRAVENOUS

## 2023-09-07 NOTE — Progress Notes
 Drain placed in [employer] (drain supply) paperwork filled out by this RN and placed in front of patient physical chart. If drain remains in place at discharge, form to be faxed, along with copy of patient face sheet and procedure note, to Felisa Bonier (fax: [phone]) by case manager. Prior to discharge home, inpatient RN should first verify that drain supplies are present in patient room, if no supplies present contact materials management (STAT Desk) [phone] and ask for drain supply bag and ensure saline flush prescription is present at discharge. If the patient discharges on a weekend contact materials management (STAT Desk) [phone] for drain supply bag, and ensure saline flush prescription is present.     Drain flushing and site care reviewed with patient and/or family at this time. Inpatient RN confirm understanding drain care instructions at time of discharge. Written instructions should be viewable in AVS at discharge.

## 2023-09-07 NOTE — Case Management (ED)
 Case Management Progress Note    NAME:Vicki Buchanan                          MRN: [id number]              DOB:1955-03-23          AGE: 68 y.o.  ADMISSION DATE: 08/20/2023             DAYS ADMITTED: LOS: 18 days      Today's Date: 09/07/2023    PLAN: Anticipate d/c home pending medical stability.     Expected Discharge Date: 09/10/2023   Is Patient Medically Stable: No, Please explain: Drain placement in IR today, Leukocytosis  Are there Barriers to Discharge? no    INTERVENTION/DISPOSITION:  Discharge Planning              Discharge Planning: Home Infusion[facility], Outpatient Rehabilitation    SW participated in huddle; EMR reviewed.    Pt is not stable for d/c at this time. Current recommendations are for home with outpatient PT.    SW will continue to follow for d/c planning and placement needs.     Transportation              Does the Patient Need Case Management to Arrange Discharge Transport? (ex: facility, ambulance, wheelchair/stretcher, Medicaid, cab, other): No  Will the Patient Use Family Transport?: Yes  Transportation Name, Phone and Availability #1: Husband [name] [phone]    Support              Support: Pt/Family Updates re:POC or DC Plan, Patient Education, Huddle/team update    Info or Referral              Information or Referral to Community Resources: No Needs Identified    Positive SDOH Domains and Potential Barriers   No Needs Identified     Medication Needs              Medication Needs: No Needs Identified    Financial              Financial: No Needs Identified    Legal              Legal: No Needs Identified    Other              Other/None: No needs identified    Discharge Disposition                                    Selected Continued Care - Admitted Since 08/20/2023       [name] complete.      Service Provider Services Address Phone Fax Patient Preferred    [facility] [address] [place] [address] [phone] --                  Wyvonna Gaster, LSCSW, LCSW  Inpatient Social Work Case Manager  Med Onc, MPP  Available on Voalte   Work Cell: [phone]

## 2023-09-07 NOTE — Progress Notes
 Rapid response called for hypotension and lethargy. On my arrival, pt was somnolent, would open eyes to vigorous stimulation, then immediately go back to sleep. Very jaundiced. Respirations even and unlabored, RRR, abdomen soft but tender. R flank biliary drain site with large amount of sanguinous drainage around side, very little sero-sang fluid noted in drain bag, per nursing had not been drained since placement. Given Narcan  with very minimal result. Ordered ABG (normal), CBC, CMP, Lactate, EKG. Glucose noted to be 14, given amp of D50. Ordered 25gm Albumin , and Nicom for 1 L. She did have LR going at 145ml/hr prior rapid response, and was given 500 ml during procedure. Despite this, she remained hypotensive, with maps in the 30-40's. Started levophed  and ICU NP, Mark, came to bedside. Discussed with NP and family and decision made to transfer to ICU for higher level of care.         I spent a total of >75 minutes of direct patient care in a critically ill patient with high risk of decompensation secondary to cholangitis. Rapid response called for patient due to hypotension and lethargy.  Services performed at bedside in addition to overall management of critically ill patient include ordering/reviewing labs, radiology, serial assessment and monitoring regarding patient's condition, discussing with consulting services (Critical Care NP) and updating the patient and family with the plan of care. Patient ultimately transferred to ICU for further management.

## 2023-09-07 NOTE — Unmapped
 Immediate Post Procedure Note    Date:  09/07/2023                                         Attending Physician:   Aleene Louder, mD  Performing Provider:  Donnice Dace, MD    Consent:  Consent obtained from patient.  Time out performed: Consent obtained, correct patient verified, correct procedure verified, correct site verified, patient marked as necessary.  Pre/Post Procedure Diagnosis:  Cholangitis, elevated bilirubin  Indications:  Cholangitis, elevated bilirubin      Procedure(s):  Biliary drain placement  Findings:  Successful right biliary drain placement     Estimated Blood Loss:  None/Negligible  Specimen(s) Removed/Disposition:  None  Complications: None  Patient Tolerated Procedure: Well  Post-Procedure Condition:  stable    Donnice Dace, MD  Pager

## 2023-09-07 NOTE — Progress Notes
 PHYSICAL THERAPY  NOTE      Name: Vicki Buchanan   MRN: [id number]     DOB: Jul 21, 1955      Age: 68 y.o.  Admission Date: 08/20/2023     LOS: 18 days     Date of Service: 09/07/2023      Patient was unavailable for Physical Therapy, patient off-unit.  Rehabilitation services will continue to follow and provide intervention as indicated.     Therapist: Rosina Rhine, PT, DPT [phone]  Date: 09/07/2023

## 2023-09-07 NOTE — Progress Notes
@   1600:  Dressing changed over the new biliary drain site.  Site leaking while the drsg is off, serous to ss in color.  Orders to be placed to flush the new drain 4 times a day.  Family updated.

## 2023-09-07 NOTE — Progress Notes
@  1300:  Pt's new biliary drain dressing leaking, serous fluid.  Dressing changed.  Pt drowsy s/p procedure w/ sedation and sleeping in recovery.  VSS on room air.

## 2023-09-07 NOTE — Progress Notes
@   1330:  Pt's biliary drain dress saturated w/ dark ss fluid.  Pt has had 2 low BP's.  Dr. Vicci notified and will come see th pt.

## 2023-09-07 NOTE — H&P (View-Only)
 Critical Care   Admission History and Physical Assessment         Name:  Vicki Buchanan                                             MRN:  [id number]   Admission Date:  08/20/2023  Principal Problem:    Cholangitis (CMS-HCC)  Active Problems:    Peritoneal carcinomatosis (CMS-HCC)    Cancer of appendix (CMS-HCC)    Hyperbilirubinemia    Moderate malnutrition    Septic shock (CMS-HCC)    Acute metabolic encephalopathy    Lactic acidosis    Elevated INR (international normalized ratio)    High anion gap metabolic acidosis                     Assessment and Plan     Vicki Buchanan is a 68 y.o. female who presents to the MICU after a RR was called for hypotension and lethargy. Found to have septic shock requiring vasopressors following biliary drain placement in IR on 6/20, worsening lactic acidosis.    Multiple family members at bedside were updated of these findings. All questions answered.     NEURO  Acute metabolic encephalopathy  Cancer related pain  - likely secondary to septic shock  - on arrival to MICU, withdrawing to pain  - now oriented x4, following commands  - oxycodone , fentanyl  & tylenol  for pain  PLAN:  - frequent neuro exams  - continue pain management    PULM  Acute respiratory respiratory failure with hypoxia  - Likely secondary to pulmonary edema from volume resuscitation in the setting of septic shock  - rales on ausculation  - Required 1-2L O2 s/p biliary drain placement  - On arrival to MICU was requiring 10L  - ABG on arrival to MICU 7.34 / 30 / 106 / 17.2  - POCUS: B-lines bilaterally, small bilateral pleural effusions w/ associated atalectasis  PLAN:  - Trend ABG  - start NIPPV 10/5 for WOB and subjective dyspnea  - Diuresis when shock improves  - Wean O2 for SpO2 > 92%    CV  Septic Shock  Lactic acidosis (worsening)  - developed worsening septic shock following biliary drain placement.  - Hypotensive during RR. S/p 1L NS with NiCOM (25.6% responsive), BP 63/45 (51), norepinephrine  started.  - On arrival to MICU, NiCOM repeated w/ LR -> not fluid responsive  - Currently on 0.03 of norepinephrine  & vaso 1.8  - EKG: Accelerated junctional rhythm  - LA 4.7 > 6.9 > 7.8 > 8.5  - Troponin 31 > 33  - NT-Pro-BNP 7516  PLAN:  - Continue vasopressors and wean for MAP > 65  - Start stress dose steroids  - Q4h lactic acid  - ID as below    GI/ONC  Cholangitis  Metastatic mucinous adenocarcinoma of the appendix  Peritoneal carcinomatosis  Malignant biliary obstruction  Hyperbilirubinemia  Diverting Loop Ileostomy in situ  Moderate Malnutrition  - diagnosed 2018  - Hyperbilirubinemia secondary to malignant biliary obstruction  - initially admitted for rising bili, leukocytosis & anemia  - recently hospitalized at [facility] and underwent an ERCP 08/06/2023 and pus was noted to be coming out of her bile duct.  - s/p HIPEC x 2  - s/p ERCP x3 with failed stents  - decreased ostomy output prompted CT  A/P on 6/18 to evaluate for bowel obstruction  - 6/18 CT A/P: Volume overload. No high-grade bowel obstruction is suspected. Mildly distended stomach. Extensive peritoneal carcinomatosis, including in the region of ostomy. Left-sided internal/external biliary drain traversing though previously noted common bile duct stent. Persistent mild-to-moderate intrahepatic biliary dilation, L > R.  - 6/20 CT A/P: Interval placement of new internal/external biliary drain without perihepatic hematoma or hemoperitoneum. Otherwise, no significant change since previous study.  - S/p left biliary drain placement 6/10 since upsized x 3 but with persistent hepatic biliary ductal dilation prompting right biliary drain placement 09/07/2023  - AST 123 / ALT 58 / Alk Phos 180 / T. Bili 15.4   - Last BM 6/20, has ileostomy  PLAN:  - Oncology consulted, no plans for inpatient cancer treatment  - GI consulted previously; no plans for endoscopic evaluation this admission- recommended ongoing goals of care discussions  - Dietary consulted; recommend regular diet without prolonged NPO orders   - ID as below  - NPO sips with meds  - bowel regimen  - stop imodium , lomotil     RENAL  High Anion Gap Metabolic Acidosis  Normal Anion Gap Metabolic Acidosis  AKI on CKD4  - Anion gap 2/2 lactic acidosis, NAGMA likely 2/2 AKI on CKD v. Diarrhea from ileostomy though has had decrease output recently  - Anion gap 18  - Baseline Cr 2.8-3.0  - Cr 3.93 / BUN 36  - Renal consulted but signed off given Cr was near baseline. Advised to f/u in clinic.   - I/O not accurate, but has voided since presentation to MICU  PLAN:  - strict I/O  - renally dose medications / avoid nephrotoxins as able  - likely will need diuresis when shock improves    ENDO  Hypoglycemia  - likely secondary to no PO intake ~24 hrs v. Worsening liver function in the setting of septic shock, abdominal metastatic disease  - BG during RR 14, s/p 1 amp of D50 > 191  - On admission to MICU, hypoglycemic again requiring amp of D50   PLAN:   - Q1 BG monitoring  - Hypoglycemic protocol    ID  Septic Shock  Leukocytosis (worsening)  Cholangitis  - received Unasyn  6/10 - 6/20  - developed worsening septic shock following biliary drain placement 6/20  - Biliary drain fluid 6/12 & 6/14: + coag-negative Staphylococcus, anaerobic culture + Veillonella parvula  - hypothermic (34.4)   - Procalcitonin 32.90  - MRSA negative  PLAN:  - ID following; appreciate assistance  - D/c unasyn  and start linezolid  & pip/tazo given progressive shock  - Resend blood cultures, UA/reflex  - Bair hugger    HEME  Acute Blood Loss Anemia  Elevated INR  - Hemoglobin slowly trending down s/p biliary drain placement  - CT A/P as above without sings of acute intraabdominal bleeding  - Right biliary drain site with moderate amount of sanguinous drainage on dressing which was redressed and pressure dressing applied. Small amount of serosanguinous drainage noted in drain bag.  - Hgb 7.3 > 6.7  - INR uptrending 1.2 > 1.9 > 2.2  PLAN:  - Transfuse 1u pRBCs  - Hold heparin   - CBC q4h  - Daily Vitamin K x3 doses    FEN  Fluids: NiCOM guided fluid resusciation  Electrolytes: K > 4, Mg > 2  Nutrition: NPO, sips with meds    Prophylaxis Review:  Lines:  Yes; Arterial Line; Indication:  Frequent blood draws and  Continuous BP monitoring; Location:  Radial  Urinary Catheter:  No  DVT ppx: No, c/f bleeding  GI ppx: pantoprazole    Activity: PT/OT      Code Status: FULL    Disposition: Admit to ICU    This patient was seen and discussed with Dr. Jonne, the ICU attending physician.    This patient is critically ill with septic shock. I spent 110 minutes providing critical care services including:  reviewing outside records and obtaining history from the patient/family members  performing a physical examination  serially reviewing laboratory, telemetry, hemodynamic, oximetry, and respiratory data  reviewing radiographic images  reviewing medications  managing fluids/electrolytes, antibiotics, ICU prophylaxis, and mechanical ventilation   developing the overall plan of care      Oneil Port APRN-NP  Pulmonary Critical Care  Pager [phone]  Contact on Voalte    M4 team pager (2nd call/nights): [phone]      ___________________________________________________________________________  Primary Care Physician: Venson Miller A      CHIEF COMPLAINT:  Hypotension, AMS    HISTORY OF PRESENT ILLNESS: Vicki Buchanan is a 69 y.o. 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     She was initially a direct admit from the clinic for hemoglobin 6.9, rising total bilirubin & leukocytosis. Underwent ERCP 08/21/2023 with stent exchange. Oncology & renal consulted but ultimately signed off with no plans for inpatient cancer treatment and her kidney function was relatively at baseline. Given continued uptrend in total bilirubin & persistent leukocytosis and history of failed stents 2/2 malignant biliary obstruction, IR was consulted for left biliary drain placement on 6/10 with subsequent upsize x 3.  Biliary fluid collected on 6/12 & 6/14 routine culture + coag-negative Staphylococcus, anaerobic culture + Veillonella parvula. ID was consulted. IR re-engaged on 6/20 for right sided biliary drain placement given persistent hyperbilirubinemia despite upsize of previous left drain placement. She was rapid responded 6/20 for AMS and hypotension attributed to septic shock.  She was given IVF without improvement in blood pressure despite being fluid responsive and was started on vasopressors.     On arrival to the MICU she was lethargic, withdrawing to pain and hypotensive requiring vasopressors. NiCOM w/ of LR and was not fluid responsive. She developed increased oxygen needs for which a POCUS was performed that was c/w pulmonary edema and was placed on NIPPV. Antibiotics broadened. Repeat ID workup in progress. ID following.     PMH:  Past Medical History:    Acid reflux    Allergy    Bundle branch block    Cancer (CMS-HCC)    Cancer of appendix (CMS-HCC)    Cancer of colon (CMS-HCC)    Pneumonia    Sarcoma (CMS-HCC)        PSH:  Surgical History:   Procedure Laterality Date    CESAREAN SECTION  1981    HX CHOLECYSTECTOMY  1995    LAPAROSCOPY  02/2017    biopsy peritoneal    COLONOSCOPY DIAGNOSTIC WITH SPECIMEN COLLECTION BY BRUSHING/ WASHING - FLEXIBLE N/A 03/19/2017    Performed by Malissa Norleen LABOR, MD at [facility] ENDO    COLONOSCOPY WITH BIOPSY - FLEXIBLE  03/19/2017    Performed by Malissa Norleen LABOR, MD at [facility] ENDO    EXPLORATORY LAPAROTOMY, DIVERTING LOOP ILEOSTOMY N/A 04/03/2017    Performed by Al-Kasspooles, Mazin, MD at [facility] OR    INSERTION TUNNELED CENTRAL VENOUS CATHETER - AGE 71 YEARS AND OVER Left 04/23/2017    Performed by Al-Kasspooles, Mazin, MD at [facility] OR    CYSTOURETHROSCOPY WITH INDWELLING URETERAL STENT INSERTION Bilateral 04/09/2018    Performed by Erskin Lenis, MD at [facility] OR    CYSTOURETHROSCOPY WITH URETERAL CATHETERIZATION WITH/ WITHOUT IRRIGATION/ INSTILLATION/ URETEROPYELOGRAPHY Bilateral 04/09/2018    Performed by Erskin Lenis, MD at [facility] OR    CYSTOURETHROSCOPY WITH INDWELLING URETERAL STENT EXCHANGE (RIGHT 6 x 26cm / LEFT 6 x 28cm) Bilateral 08/08/2018    Performed by Erskin Lenis, MD at [facility] OR    RETROGRADE UROGRAPHY WITH/ WITHOUT KUB Bilateral 08/08/2018    Performed by Erskin Lenis, MD at [facility] OR    ENDOSCOPIC RETROGRADE CHOLANGIOPANCREATOGRAPHY WITH SPHINCTEROTOMY/ PAPILLOTOMY N/A 09/16/2021    Performed by Rogers Kipper, MD at [facility] ENDO    ESOPHAGOGASTRODUODENOSCOPY WITH ENDOSCOPIC ULTRASOUND EXAMINATION - FLEXIBLE  09/16/2021    Performed by Rogers Kipper, MD at [facility] ENDO    ENDOSCOPIC RETROGRADE CHOLANGIOPANCREATOGRAPHY WITH PLACEMENT ENDOSCOPIC STENT INTO BILIARY/ PANCREATIC DUCT - EACH STENT  09/16/2021    Performed by Rogers Kipper, MD at [facility] ENDO    COLONOSCOPY DIAGNOSTIC WITH SPECIMEN COLLECTION BY BRUSHING/ WASHING - FLEXIBLE N/A 09/26/2021    Performed by Rogers Kipper, MD at [facility] OR    ENDOSCOPIC RETROGRADE CHOLANGIOPANCREATOGRAPHY WITH PLACEMENT ENDOSCOPIC STENT INTO BILIARY/ PANCREATIC DUCT - EACH STENT  09/29/2021    Performed by Noralyn Blossom, MD at [facility] ENDO    ENDOSCOPIC RETROGRADE CHOLANGIOPANCREATOGRAPHY with plastic stent exchange N/A 10/27/2021    Performed by Noralyn Blossom, MD at [facility] ENDO    ENDOSCOPIC RETROGRADE CHOLANGIOPANCREATOGRAPHY WITH ABLATION TUMOR/ POLYP/ OTHER LESION  10/27/2021    Performed by Noralyn Blossom, MD at [facility] ENDO    ENDOSCOPIC RETROGRADE CHOLANGIOPANCREATOGRAPHY WITH REMOVAL CALCULI/ DEBRIS FROM BILIARY/ PANCREATIC DUCT  10/27/2021    Performed by Noralyn Blossom, MD at [facility] ENDO    ENDOSCOPIC RETROGRADE CHOLANGIOPANCREATOGRAPHY  plastic stent replacement N/A 12/22/2021    Performed by Noralyn Blossom, MD at [facility] ENDO    ENDOSCOPIC RETROGRADE CHOLANGIOPANCREATOGRAPHY N/A 03/30/2022    Performed by Noralyn Blossom, MD at [facility] ENDO    ENDOSCOPIC RETROGRADE CHOLANGIOPANCREATOGRAPHY WITH REMOVAL AND EXCHANGE OF STENT BILIARY/ PANCREATIC DUCT - EACH STENT EXCHANGED N/A 03/30/2022    Performed by Noralyn Blossom, MD at [facility] ENDO    ENDOSCOPIC RETROGRADE CHOLANGIOPANCREATOGRAPHY WITH REMOVAL AND EXCHANGE OF STENT BILIARY/ PANCREATIC DUCT - EACH STENT EXCHANGED N/A 07/27/2022    Performed by Noralyn Blossom, MD at [facility] ENDO    ENDOSCOPIC RETROGRADE CHOLANGIOPANCREATOGRAPHY N/A 11/02/2022    Performed by Noralyn Blossom, MD at [facility] ENDO    ENDOSCOPIC RETROGRADE CHOLANGIOPANCREATOGRAPHY WITH REMOVAL AND EXCHANGE OF STENT BILIARY/ PANCREATIC DUCT - EACH STENT EXCHANGED N/A 11/02/2022    Performed by Noralyn Blossom, MD at [facility] ENDO    ENDOSCOPIC RETROGRADE CHOLANGIOPANCREATOGRAPHY WITH REMOVAL AND EXCHANGE OF STENT BILIARY/ PANCREATIC DUCT - EACH STENT EXCHANGED N/A 01/25/2023    Performed by Noralyn Blossom, MD at [facility] ENDO    ENDOSCOPIC RETROGRADE CHOLANGIOPANCREATOGRAPHY WITH REMOVAL AND EXCHANGE OF STENT BILIARY/ PANCREATIC DUCT - EACH STENT EXCHANGED N/A 04/26/2023    Performed by Noralyn Blossom, MD at [facility] ENDO    ENDOSCOPIC RETROGRADE CHOLANGIOPANCREATOGRAPHY WITH REMOVAL AND EXCHANGE OF STENT  BILIARY/ PANCREATIC DUCT - EACH STENT EXCHANGED N/A 07/26/2023    Performed by Noralyn Blossom, MD at [facility] ENDO    ENDOSCOPIC RETROGRADE CHOLANGIOPANCREATOGRAPHY WITH REMOVAL CALCULI/ DEBRIS FROM BILIARY/ PANCREATIC DUCT N/A 08/16/2023    Performed by Noralyn Blossom, MD at [facility] ENDO    ENDOSCOPIC RETROGRADE CHOLANGIOPANCREATOGRAPHY WITH REMOVAL AND EXCHANGE OF STENT BILIARY/ PANCREATIC DUCT - EACH STENT EXCHANGED N/A 08/16/2023    Performed by Noralyn Blossom, MD at [facility] ENDO ENDOSCOPIC RETROGRADE CHOLANGIOPANCREATOGRAPHY, WITH NEOPLASM OR POLYP ABLATION N/A 08/16/2023    Performed by Noralyn Blossom, MD at [facility] ENDO    ENDOSCOPIC RETROGRADE CHOLANGIOPANCREATOGRAPHY WITH REMOVAL AND EXCHANGE OF STENT BILIARY/ PANCREATIC DUCT - EACH STENT EXCHANGED N/A 08/21/2023    Performed by Rogers Kipper, MD at [facility] ENDO    COLON SURGERY  7/20    COLON SURGERY  7/20    HX APPENDECTOMY  1/10    Guess on date    HX CESAREAN SECTION  4/81    HX HYSTERECTOMY  7/20    HX LOWER ANTERIOR RESECTION OF COLON      7/20    HX SALPINGO-OOPHORECTOMY      ILEOSTOMY OR JEJUNOSTOMY Right     LIVER DONOR SURGERY  12/24/21    Liver stents    OVARY SURGERY      Removed 7/20    PORTACATH PLACEMENT  1/20    PR PNCRTECT PROX STOT W/PANCREATOJEJUNOSTOMY  7/20    STOMACH SURGERY      7/20    TUNNELED VENOUS PORT PLACEMENT Left 1/19    URETER STENT PLACEMENT Bilateral         SOCIAL HISTORY:  Social History     Socioeconomic History    Marital status: Married   Tobacco Use    Smoking status: Never    Smokeless tobacco: Never   Vaping Use    Vaping status: Never Used   Substance and Sexual Activity    Alcohol use: Not Currently     Comment: rarely    Drug use: Not Currently    Sexual activity: Yes     Partners: Male     Birth control/protection: Post-menopausal        FAMILY HISTORY:  Family History   Problem Relation Name Age of Onset    Diabetes Mother Murray     Cancer Father Lamar     Diabetes Brother Octaviano     Cancer-Lung Paternal Aunt Doris     Cancer-Breast Maternal Grandmother Helen     Diabetes Paternal Grandmother Annabelle         IMMUNIZATIONS:   Immunization History   Administered Date(s) Administered    COVID-19 (MODERNA), mRNA vacc, 100 mcg/0.5 mL (PF) 11/25/2019    Flu Vaccine =>6 Months Quadrivalent PF 12/31/2017    Influenza Vaccine Whole 02/14/2017, 01/03/2019    Meningococcal Conjug Vaccine IM (MenACWY-CRM)(Menveo) 09/30/2018    Meningococcal Group B Vaccine (4-Cmp) 2 Dose Regimen (Bexsero) 09/30/2018, 10/28/2018 Pneumococcal Vaccine (23-Val Adult) 02/03/2018    Pneumococcal Vaccine(13-Val Peds/immunocompromised adult) 09/27/2018           ALLERGIES:  Indomethacin  and Shrimp    HOME MEDICATIONS:  Medications Prior to Admission   Medication Sig    acetaminophen /lidocaine /antacid(#) (GI COCKTAIL) 1:1:1 oral suspension Take 30 mL by mouth every 4 hours as needed.    apap-maalox-lidocaine  1:3:1 oral suspension (MIXTURE COMPOUND) Take 30 mL by mouth every 4 hours as needed. (Patient taking differently: Take  by mouth every 4 hours as needed.)  calcium  carbonate (TUMS) 500 mg (200 mg elemental calcium ) chewable tablet Chew one tablet by mouth every 6 hours as needed.    carvediloL  (COREG ) 6.25 mg tablet Take one tablet by mouth twice daily. Take with food.  Hold if systolic blood pressure < 110    cetirizine  (ZYRTEC ) 10 mg tablet Take one tablet by mouth daily.    diphenoxylate -atropine  (LOMOTIL ) 2.5-0.025 mg tablet Take one tablet by mouth four times daily as needed for Diarrhea.    esomeprazole  DR (NEXIUM ) 20 mg capsule Take one capsule by mouth daily. Take on an empty stomach at least 1 hour before or 2 hours after food.    hydrOXYzine  HCL (ATARAX ) 25 mg tablet Take one tablet by mouth every 6 hours as needed for Itching.    [EXPIRED] levoFLOXacin  (LEVAQUIN ) 250 mg tablet Take three tablets by mouth daily for 1 day, THEN two tablets every 48 hours for 7 days. Take 3 tablets by mouth for 1 day then 48 hours later take 2 tablets every 48 hours for 7 days    loperamide  (IMODIUM  A-D) 2 mg capsule Take one pill by mouth four times daily as needed    magnesium  oxide 400 mg magnesium  tablet Take one tablet by mouth twice daily.    ondansetron  (ZOFRAN  ODT) 8 mg rapid dissolve tablet DISSOLVE 1 TABLET UNDER THE TONGUE EVERY 8 HOURS AS NEEDED FOR NAUSEA AND VOMITING    potassium chloride  (KLOR-CON  SPRINKLE) 10 mEq capsule Take one capsule by mouth twice daily. If potassium < 3.5. Take with a meal and a full glass of water .    sod chlor,sod bicarb/neti pot (SINUS WASH NETI POT SINI) Use as directed    traMADoL  (ULTRAM ) 50 mg tablet Take one tablet to two tablets by mouth daily as needed for Pain.        ROS:    Review of systems not obtained due to patient factors.    Physical Exam:    General appearance: chronically ill appearing female, appears older than stated age; encephalopathic  Head: Normocephalic, without obvious abnormality  Eyes: scleral icterus present. PERRL  Mouth/throat: Moist mucous membranes,   Neck: supple, symmetrical, trachea midline  Lungs: rales bilaterally  Heart: regular rate and rhythm, S1, S2 normal  Abdomen: tender, somewhat firm with multiple surgical scars, ileostomy and biliary drains x 2; non-distended  Extremities: extremities normal, atraumatic, no cyanosis; 2-3+ generalized edema  Neurologic: no focal  Peripheral pulses:  2+ and symmetric  Cap Refill:  <3 sec  Skin: Skin jaundiced. No rashes or lesions  Musculoskeletal:  No tenderness, normal ROM    Artificial Airway   None       Ventilator/Respiratory Therapy  No     Vent Weaning   Not applicable      Vital Signs:                     Vital Signs: Last Filed                  Vital Signs: 24 Hour Range   BP: 85/58 (06/20 1917)  Temp: 36.2 ?C (97.2 ?F) (06/20 1917)  Pulse: 82 (06/20 2007)  Respirations: 18 PER MINUTE (06/20 1917)  SpO2: 92 % (06/20 2007)  O2 Device: Nasal cannula (06/20 1917)  O2 Liter Flow: 1 Lpm (06/20 1917) BP: (84-167)/(57-100)   Temp:  [36 ?C (96.8 ?F)-36.8 ?C (98.3 ?F)]   Pulse:  [54-108]   Respirations:  [0 PER MINUTE-30 PER MINUTE]  SpO2:  [91 %-100 %]   O2 Device: Nasal cannula  O2 Liter Flow: 1 Lpm   Intensity Pain Scale (Self Report): 4 (09/07/23 1002) Vitals:    09/05/23 1636 09/06/23 0431 09/07/23 0939   Weight: 62.1 kg (137 lb) 61.3 kg (135 lb 3.2 oz) 60.7 kg (133 lb 12.8 oz)         Laboratory:    Recent Labs     09/05/23  0306 09/06/23  0226 09/07/23  0257 09/07/23  2015   NA 136* 135* 135* 138   K 3.7 3.8 3.7 3.6   CL 101 103 100 104   CO2 22 22 24  16*   GAP 13* 10 11 18*   BUN 32* 34* 33* 36*   CR 3.67* 3.72* 3.73* 3.93*   GLU 103* 78 81 17*   CA 8.1* 8.0* 7.5* 7.2*   ALBUMIN  2.1* 2.1* 2.1* 1.8*   MG 1.6 1.7 1.7 1.6   PO4 2.8 3.8 3.8 3.7      Recent Labs     09/05/23  0306 09/06/23  0226 09/07/23  0257 09/07/23  1744 09/07/23  2015   WBC 17.80* 15.40* 14.10*  --  24.50*   HGB 9.8* 8.7* 8.6* 7.6* 7.3*   HCT 27.9* 25.1* 24.0* 22.0* 21.3*   PLTCT 133* 132* 131*  --  92*   PT 13.3 14.1 14.2  --  21.8*   INR 1.2 1.2 1.3*  --  1.9*   PTT  --   --   --   --  41.7*   AST 104* 100* 91*  --  123*   ALT 66* 62* 56  --  58*   ALKPHOS 210* 181* 180*  --  180*   MYOGLB  --   --   --   --  56      Estimated Creatinine Clearance: 11.7 mL/min (A) (based on SCr of 3.93 mg/dL (H)).  Vitals:    09/05/23 1636 09/06/23 0431 09/07/23 0939   Weight: 62.1 kg (137 lb) 61.3 kg (135 lb 3.2 oz) 60.7 kg (133 lb 12.8 oz)    No results for input(s): PHART, PO2ART in the last 72 hours.    Invalid input(s): PC02A              Radiology and Other Diagnostic Procedures Review:    Reviewed

## 2023-09-07 NOTE — Progress Notes
 General Medicine Daily Progress Note    Name: Vicki Buchanan        MRN: [id number]          DOB: [birth year]            Age: 68 y.o.  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     Mucinous adenocarcinoma of the appendix  hyperbilirubinemia  malignant biliary obstruction  ascending cholangitis   post ERCP pancreatitis   - admitted with rising bili, leukocytosis, anemia  - oncology consulted, appreciate assistance  - recently hospitalized at [facility] with pus noted to be coming out of her bile ducts during ERCP 08/06/23  - biliary consulted, s/p ERCP 08/21/23: Uncovered metal stent coming out of the ampulla with double-pigtail plastic stent going through the lumen of the metal stent.  Food and stent debris seen in stent lumen.  Occlusion cholangiogram performed that showed narrowing in the midportion of the stent consistent with tumor/tissue ingrowth.  Right hepatic duct mildly dilated without any obvious stricture.  Left intrahepatic ducts normal-appearing.  A 10 mm x 8 cm viabil stent was deployed within the previously placed uncovered biliary stent with good outflow of contrast.   - CT A/P 6/5:   Development of small left pleural effusion and minimal basilar bronchiolitis.  No small bowel obstruction, ascites, or free air.  Redemonstration of extensive partially calcified peritoneal metastatic disease.   New small subcutaneous nodule right lateral pelvis which is probably a new metastasis.   Right ischial pressure ulcer.   - abdominal pain, elevated lipase post ERCP: post-procedure pancreatitis  Plan  - ID consulted, on unasyn . Duration to be determined by WBC and liver enzymes/bilirubin.   - Status post placement of 8 French internal/external biliary drain via a dilated peripheral left duct.  6/14 upsized to 18 [place] biliary drain due to concern for malfunction.  6/20 second drain placement with IR due to persistently high bilirubin   - Biliary drain should be placed to gravity drainage until bilirubin normalizes and flushed with 10 mL normal saline twice a day.  Patient follow-up in 3 months for routine preventive maintenance of biliary catheters.    Decreased ostomy output  > Repeat CT A/P wo contrast 6/18 to rule out obstruction -- no evidence of obstruction, CTM and enemas as needed      Anemia  > 1 unit prbcs ordered on 6/7.  > 6/17 Hgb dropped to 6.9, 1 unit ordered. 6/18 improved      AKI on CKD4  chronic mild L hydronephrosis   - follows with Dr. Marlo as outpatient  Plan  > Continue IVF for goal net even to slightly positive. 6/20 give 2L IVF @100mL /hr  > Nephrology following     LLE pain  > LLE doppler neg for DVT     GERD  - continue PPI  - add PRN GI cocktail      FEN:   Fluids: LR  Monitor and replace electrolytes as  needed  Diet: cardiac   DVT PPX: heparin  SQ held with drop in hgb      CODE STATUS: FULL CODE   NOK: husband     Disposition:  continue admission. Home on discharge pending WBC and bilirubin, abx plan, Cr. Likely early next week.        Malnutrition Details:  Malnutrition present  ICD-10 code E44: Acute illness/Moderate non-severe malnutrition  Mild loss of body fat, Mild loss of muscle mass, Energy Intake: Less than 75% of estimated energy requirement for greater than 7days            Loss of Subcutaneous Fat: Yes Mild Triceps  Muscle Wasting: Yes Mild Clavicle               Malnutrition Interventions: Encouraged small frequent meals, reviewed meal ideas, encouraged Ensure supplements      Wound:                   Brian Prime, MD  Hospitalist, Internal Medicine    Voalte is the preferred method of communication.   Please use the Med Private First Call for all patient-related communications. Personal Voaltes and pagers are not answered at all hours.      High medical decision making due to the following:  1 acute or chronic illness that poses a threat to life or bodily function  Review of notes outside of my specialty, Review of each unique test, and Ordering of each unique test and independent interpretation of a test (CBC, CMP)       _______________________________________________________________________    Subjective  Vicki Buchanan is a 68 y.o. female.  Seen at bedside, sister and daughter present. Walked yesterday but didn't eat a lot of food. Increased belching today. Ostomy with some output but decreased from baseline PTA. Agreeable to drain placement today.     Medications  Scheduled Meds:ampicillin /sulbactam (UNASYN ) 3 g in sodium chloride  0.9% (NS) 100 mL IVPB (MB+), 3 g, Intravenous, Q24H*  ERGOcalciferoL  (vitamin D2) (DRISDOL ) capsule 50,000 Units, 50,000 Units, Oral, Q7 Days  [Held by Provider] heparin  (porcine) PF syringe 5,000 Units, 5,000 Units, Subcutaneous, Q8H  lactated ringers  infusion, , Intravenous, ONCE  pantoprazole  (PROTONIX ) injection 40 mg, 40 mg, Intravenous, API(88-78)  sodium chloride  0.9 % (flush) syringe 10 mL, 10 mL, Intra-catheter, BID  sodium chloride  0.9 % (flush) syringe 10 mL, 10 mL, Intra-catheter, BID    Continuous Infusions:  PRN and Respiratory Meds:acetaminophen  Q6H PRN, alteplase  BID PRN, alum-mag hydroxide-simeth Q4H PRN **AND** [EXPIRED] lidocaine  hcl viscous ONCE, calcium  carbonate Q6H PRN, cetirizine  QDAY PRN, diphenoxylate -atropine  QID PRN, fentaNYL  citrate PF Q3H while awake PRN, flumazeniL  PRN, hydrOXYzine  HCL Q6H PRN, loperamide  QID PRN, melatonin QHS PRN, nalOXone  PRN, ondansetron  Q6H PRN **OR** ondansetron  (ZOFRAN ) IV Q6H PRN, oxyCODONE  Q4H PRN, polyethylene glycol 3350  QDAY PRN, sennosides-docusate sodium  QDAY PRN, sodium chloride  0.9% TKO infusion PRN, trimethobenzamide  Q6H PRN        Objective:                          Vital Signs: Last Filed                 Vital Signs: 24 Hour Range   BP: 127/63 (06/20 0259)  Temp: 36.8 ?C (98.3 ?F) (06/20 0259)  Pulse: 77 (06/20 0259)  Respirations: 16 PER MINUTE (06/20 0259)  SpO2: 97 % (06/20 0259)  O2 Device: None (Room air) (06/20 0259) BP: (127-136)/(63-67)   Temp:  [  36.5 ?C (97.7 ?F)-36.8 ?C (98.3 ?F)]   Pulse:  [71-86]   Respirations:  [16 PER MINUTE]   SpO2:  [97 %-98 %]   O2 Device: None (Room air)     Vitals:    09/04/23 1000 09/05/23 1636 09/06/23 0431   Weight: 61.7 kg (136 lb) 62.1 kg (137 lb) 61.3 kg (135 lb 3.2 oz)         Intake/Output Summary:  (Last 24 hours)    Intake/Output Summary (Last 24 hours) at 09/07/2023 9177  Last data filed at 09/07/2023 9177  Gross per 24 hour   Intake 1080 ml   Output 2690 ml   Net -1610 ml           Physical Exam  Physical Exam  Vitals reviewed.   Pulmonary:      Effort: Pulmonary effort is normal.   Abdominal:      Comments: hepatic drain in place    Musculoskeletal:      Right lower leg: Edema present.      Left lower leg: Edema present.   Skin:     Coloration: Skin is jaundiced.   Neurological:      Mental Status: She is alert.           Lab Review  Pertinent labs reviewed and noted in A/P.     Point of Care Testing  (Last 24 hours)  Glucose: 81 (09/07/23 0257)    Radiology and other Diagnostics Review:    Pertinent radiology reviewed and noted in A/P.

## 2023-09-07 NOTE — Progress Notes
 1845  MPP Ronal Morton, NP notified that patient is lethargic after returning from procedure. Patient is able to follow commands and A&O x4,  has been bleeding from new bili drain site. Dressing had been changed multiple times since drain has been placed. Per provider administered narcan .     Rapid response activated at 1946 due to patient lethargy and hypotension. Please refer to rapid response note for further details. Ronal Morton, NP to bedside.

## 2023-09-07 NOTE — Progress Notes
@   1429:  500 ml NS bolus ordered & CT.

## 2023-09-07 NOTE — Unmapped
 Patient/family declined:  Bed/chair alarm, W/in arms reach during toileting/showering, and Ambulation.    Patient/family educated on importance of intervention to their safety/quality of care. Patient/family continues to decline care.    Reason Why Patient/Family declined:  Patient wishes to ambulate independently when family is present. Bed is in lowest, locked position and call light is within reach.    Individualized safety and/or care plan implemented. If additional safety measures implemented, please list them.     Escalated to:  Unit coordinator/charge nurse.

## 2023-09-07 NOTE — Progress Notes
 Transplant & Immunocompromised  Infectious Diseases Progress Note    Today's Date:  09/07/2023  Admission Date: 08/20/2023    Reason for this consultation: Elevating white count with hyperbilirubinemia     Assessment:     Cholestatic jaundice due to malignant obstruction without evidence of ascending cholangitis  Obstructive biliary tree; slender left intrahepatic ducts; tumor ingrowth into midportion of metal stent; LHD opacified with difficulty  08/03/2023 admitted to [facility] with acute cholangitis  Started Zosyn   08/04/2023 urine culture: >100K CFU Enterococcus faecalis (S) ampicillin   08/06/2023 underwent ERCP which revealed indwelling uncovered metal biliary stent in the left hepatic duct which was exchanged with plastic stent, mild stenosis within the metal stent at the proximal main bile duct, diffuse malignant stenoses of intrahepatic ducts; presence of purulence  08/07/2023 discontinued Zosyn ; started Augmentin ; discharged home  08/16/2023 underwent ERCP which revealed a protruding indwelling metallic stent in CBD seen coming out from ampulla with a plastic stent inside the metal stent, plastic stent was removed, occlusion cholangiogram: right posterior intrahepatic duct opacified; left hepatic system not opacified and totally; right anterior intrahepatic with branches partially opacified with significant narrowing beyond indwelling stent up to the hilum of liver; palced a Habiba catheter size 8 French, 2.7 and RFA x 2; large amount of tumor ingrowth inside the indwelling stent with large amount of tissue growing at the entrance of the stent which again this area also tumor destruction applied using RFA x 2; placed a 10 French, 10 cm double-pigtail plastic stent was successfully placed into the right intrahepatic duct; good outflow of bile and contrast seen through both the stents  08/20/2023 seen in oncology clinic [facility], noted to have persistent jaundice status post ERP and stent placement, received cefepime  x 1 dose  08/20/23: Directly admitted to the hospitalist service  Blood cultures: No growth x 2 sets  Urine culture: No growth  Started Zosyn   08/21/23 ERCP: uncovered metal stent in CBD extending out of ampulla with a double-pigtail plastic stent through the lumen of the uncovered metal stent, double-pigtail plastic stent removed, stone extraction balloon passed into left intrahepatic duct, balloon sweeps with extraction of a large amount of sludge, food debris and stent debris;  occlusion cholangiogram: Narrowing within the midportion of the stent consistent with tumor/tissue ingrowth.  RHD mildly dilated without stricture.  LHD opacified with difficulty.  Left intrahepatic ducts were slender and not well-visualized.  Palced 10 mm x 8 cm VIABIL stent within the previously placed uncovered biliary stent, upper end of the VIABIL stent was below the hilar confluence, good outflow of contrast after stent deployment   08/23/2023 CT ABD/pelvis: Development of small left pleural effusion and minimal basilar   bronchiolitis. No small bowel obstruction, ascites, or free air. Redemonstration of extensive partially calcified peritoneal metastatic disease. New small subcutaneous nodule right lateral pelvis which is probably a new metastasis. Right ischial pressure ulcer.   08/28/2023 IR: Placement of internal/external biliary drain (8 [place]) into the left ductal system  No meaningful drain output; progressive leukocytosis  08/30/2023 IR: Upsizing of internal/external biliary drain (8 Fr -> 12 Fr) within the left ductal system.  Some particulate matter identified on cholangiogram  - 6/12 biliary fluid GS: Moderate PMN, few budding yeast, few GPC in singles/pairs; routine culture + coag-negative Staphylococcus, anaerobic culture + Veillonella parvula  6/14 IR: Persistent biliary ductal dilation. Successful upsize to 52fr drain. Additional side holes cut in catheter to accommodate length of indwelling stent  6/14 biliary drain fluid  GS: Rare PMN, moderate budding yeast; routine culture + coag-negative Staphylococcus; anaerobic culture + Veillonella parvula  6/18 CT abdomen/pelvis w/o: Fluid overload, as evidenced by increasing anasarca, bilateral effusions, mesenteric congestion, and trace ascites.  No high-grade bowel obstruction is suspected.  Mildly distended stomach.  Extensive peritoneal carcinomatosis, including in the region of ostomy.  Left-sided internal/external biliary stent traversing through previously noted, bile duct stent.  Persistent mild to moderate intrahepatic biliary dilatation, left greater than right.  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 with subsequent removal of ureteral stents  02/2017 diagnosed on laparoscopy  04/03/2017 underwent loop ileostomy creation  12/2017 started epacadosta and sirolimus   05/2018 discontinued epacadosta and sirolimus   05/27/2020 underwent laparoscopic HIPEC with mitomycin-C (trial at [facility])  10/07/2018 underwent second laparoscopic HIPEC  09/12/2018 underwent tumor debulking [facility])   05/2020 underwent radiation therapy to right lung metastases  04/16/2020 underwent cytoreductive surgery with ostomy revision due to tumor invasion  09/2020 started FOLFIRI and bevacizumab    09/07/2021 MRI/MRCP: Slight apparent increase in diffuse intrahepatic and extrahepatic biliary ductal dilatation to the level of the porta hepatis where there is   extrinsic compression on the extrahepatic common duct from metastases. No   biliary filling defect to suggest choledocholithiasis or biliary mass. Grossly unchanged extensive peritoneal carcinomatosis and right abdominal wall metastases. Partially visualized indeterminate lesion in the left sacrum, which may   be a metastasis or hemangioma.   09/16/2021 underwent ERCP this month of an uncovered metal stent in the CBD  09/2021 held bevacizumab    08/04/2023 CT ABD/pelvis: Extensive metastatic disease throughout the abdomen and pelvis including peritoneal implants with mass effect on adjacent structures. Left posterior pleural implant with invasion of the chest wall and adjacent smaller nodules. Common bile duct and biliary stent in place. Moderate to intrahepatic biliary ductal dilatation. Right lower quadrant ostomy encased with implants. No gas-filled dilated loops of bowel to suggest obstruction. Mild left hydronephrosis with prominent left extrarenal pelvis. Diffuse bladder wall thickening with adjacent fat stranding, possibly secondary to acute cystitis. Correlation with urinalysis is recommended. Distended vaginal cuff with air-fluid level. The posterior wall of the urinary bladder is adjacent to the vaginal cuff without preserved fat plane of separation.   08/20/2023 CEA 2565.5  Currently FOLFIRI cycle 40     AKI over CKD  Baseline creatinine ~1.5  6/17 creatinine up to 3.71     Recurrent urinary tract infections     Pelvic abscess following tumor debulking  10/2018 abscess cultures: Serratia and Candida albicans  Underwent IR abscess drain placement  Received course of cefepime  and fluconazole      Asplenia, surgical  02/2018 Pneumovax   09/2018 PCV13 (Prevnar)   09/2018 Haemophilus influenzae type B   Meningococcal conjugate vaccine (Menveo) 09/30/2018 and 11/2018  Meningococcal serogroup B vaccine (MenB) 09/30/2018 and 10/28/2018    Recommendations:     Recent ERCPs confirmed further tumor growth with some obstruction of the existing metal stent within the CBD as well as a partially obstructed left hepatic ductal system, as evidenced on the cholangiogram.    Patient has not had fever but has developed progressive leukocytosis and had persistent elevation of hepatic transaminases, alkaline phosphatase and total bilirubin. On 08/28/2023, she underwent placement of internal/external biliary drain into the left hepatic ductal system to decompress.  However, effectively no output from this  new enteral/external biliary drain.  Drain was upsized 6/12 and 614 from 8Fr -> 18Fr catheter.  Now with better output.  Bilirubin increased again.  Right-sided internal/external biliary drain placed 6/20.    Please continue ampicillin /sulbactam for now.  White blood cell count continues to improve.  Duration of antibiotic therapy to be determined based on adequate decompression of the biliary tree and improvement of leukocytosis.  Monitor temp and WBC count.    We'll follow.    Interval Hx     Patient afebrile  Hemodynamically stable  On room air during the night; on 2 LPM of O2 per NC during my visit    Patient underwent right sided internal/external biliary drain placement in IR this morning  Seen after the procedure  Sleeping and not answering questions  Husband at the bedside  Had nausea and vomiting last evening    Ileostomy output in the past 24 hours: 600 mL.    WBC 14.1-trending down  Hemoglobin 8.6  Platelets 131  Creatinine 3.73  AST 91  ALT normal  ALP 180  T. bili 14.4-slightly better    Drain output over previous 24 hours:  Left internal/external biliary drain: 690 mL (flushed with 10 mL)      Antimicrobial Start date End date   Cefepime  6/2 6/2   Pip/tazo 6/2 6/6   Amp/sulb 6/11 Active                       Estimated Creatinine Clearance: 12.4 mL/min (A) (based on SCr of 3.73 mg/dL (H)).    Medications   Scheduled Meds:ampicillin /sulbactam (UNASYN ) 3 g in sodium chloride  0.9% (NS) 100 mL IVPB (MB+), 3 g, Intravenous, Q24H*  ERGOcalciferoL  (vitamin D2) (DRISDOL ) capsule 50,000 Units, 50,000 Units, Oral, Q7 Days  [Held by Provider] heparin  (porcine) PF syringe 5,000 Units, 5,000 Units, Subcutaneous, Q8H  pantoprazole  (PROTONIX ) injection 40 mg, 40 mg, Intravenous, API(88-78)  sodium chloride  0.9 % (flush) syringe 10 mL, 10 mL, Intra-catheter, BID  sodium chloride  0.9 % (flush) syringe 10 mL, 10 mL, Intra-catheter, BID    Continuous Infusions:      PRN and Respiratory Meds:acetaminophen  Q6H PRN, alteplase  BID PRN, alum-mag hydroxide-simeth Q4H PRN **AND** [EXPIRED] lidocaine  hcl viscous ONCE, calcium  carbonate Q6H PRN, cetirizine  QDAY PRN, diphenoxylate -atropine  QID PRN, fentaNYL  citrate PF Q3H while awake PRN, flumazeniL  PRN, hydrOXYzine  HCL Q6H PRN, loperamide  QID PRN, melatonin QHS PRN, nalOXone  PRN, ondansetron  Q6H PRN **OR** ondansetron  (ZOFRAN ) IV Q6H PRN, oxyCODONE  Q4H PRN, polyethylene glycol 3350  QDAY PRN, sennosides-docusate sodium  QDAY PRN, sodium chloride  0.9% TKO infusion PRN, trimethobenzamide  Q6H PRN      Allergies     Allergies   Allergen Reactions    Indomethacin  SEE COMMENTS     Pain for several days after suppository post ERCP. Tolerated IV but not PR    Shrimp SEE COMMENTS     Eye irritation. Namely blood vessel issues in the eyes        Physical Examination                          Vital Signs: Last                  Vital Signs: 24 Hour Range   BP: 127/63 (06/20 0259)  Temp: 36.8 ?C (98.3 ?F) (06/20 0259)  Pulse: 77 (06/20 0259)  Respirations: 16 PER MINUTE (06/20 0259)  SpO2: 97 % (06/20 0259)  O2 Device: None (Room air) (  06/20 0259) BP: (127-136)/(63-67)   Temp:  [36.5 ?C (97.7 ?F)-36.8 ?C (98.3 ?F)]   Pulse:  [71-86]   Respirations:  [16 PER MINUTE]   SpO2:  [97 %-98 %]   O2 Device: None (Room air)     Gen: Sleeping, NAD  HEENT: Scleral icterus present  Lungs: Clear breath sounds bilaterally  Heart: Regular, no murmur  Abd: + BS, soft, no tenderness to palpation, moderately distended, right and left left internal/external biliary drains in place, no output from the right biliary drain yet, ostomy intact  Ext: 1+ bilateral lower extremity edema  Skin: Jaundice, no acute rash  Musculoskel: No warm or swollen joints  Psych: Unable to assess    Lines: Port-A-cath accessed, unremarkable aside    Drains/tubes:  Ileostomy RLQ  Left int/ext biliary drain  Right int/ext biliary drain    Lab Review   Hematology  Recent Labs     09/05/23  0306 09/06/23  0226 09/07/23  0257   WBC 17.80* 15.40* 14.10*   HGB 9.8* 8.7* 8.6*   HCT 27.9* 25.1* 24.0*   PLTCT 133* 132* 131*   INR 1.2 1.2 1.3*     Chemistry  Recent Labs     09/05/23  0306 09/06/23  0226 09/07/23  0257   NA 136* 135* 135*   K 3.7 3.8 3.7   CL 101 103 100   CO2 22 22 24    BUN 32* 34* 33*   CR 3.67* 3.72* 3.73*   GFR 13* 13* 13*   GLU 103* 78 81   CA 8.1* 8.0* 7.5*   PO4 2.8 3.8 3.8   ALBUMIN  2.1* 2.1* 2.1*   ALKPHOS 210* 181* 180*   AST 104* 100* 91*   ALT 66* 62* 56   TOTBILI 16.0* 14.5* 14.4*       Microbiology, Radiology and other Diagnostics Review   Microbiology data reviewed.    Pertinent radiology images viewed.       Clem JINNY Sheen, MD   Contact via [facility]  Transplant & Immunocompromised Infectious Diseases

## 2023-09-07 NOTE — Progress Notes
@   1015:  Pt in IR bay 20 for biliary drain placement.  Pt is Ox4 and VSS on room air.  Pt rates her abd pain at a 4/10.  Pt has been NPO w/ no n/v.  Pt's left chest Port is patent/blood return.  IV Fent given in pre post.  Pt's existing biliary drain is patent w/ green output.

## 2023-09-07 NOTE — Case Management (ED)
 Case Management Progress Note    NAME:Vicki Buchanan                          MRN: [id number]              DOB:Apr 21, 1955          AGE: 68 y.o.  ADMISSION DATE: 08/20/2023             DAYS ADMITTED: LOS: 18 days      Today's Date: 09/07/2023    PLAN: DC planning, ongoing    Expected Discharge Date: 09/10/2023   Is Patient Medically Stable: No, Please explain: Drain placement in IR today, Leukocytosis  Are there Barriers to Discharge? no    INTERVENTION/DISPOSITION:  Discharge Planning              Discharge Planning: Home Infusion-[facility], Outpatient Rehabilitation  -Pt is on service with [facility] for twice weekly hydration fluids.   -NCM covering for today called [facility], [phone], left a VM for their rep requesting a call back to discuss her current OP home infusion rx and plan to dc early next week.   -Discussed at huddle. New abd drain placed today. Team will be trending WBC over the next 2-3 days. Plan to dc to home on Monday vs Tuesday if leukocytosis resolved/resolving   -Current antibx: IV Unasyn  3 grams Q 24 hours. Team plans to transition to oral therapy when ready for dc pending WBC trends and fluid cultures.   -Please reach out to the Primary NCM for pt's service for ongoing dc planning needs M-F. Please consult Voalte for CM coverage.   Transportation              Does the Patient Need Case Management to Arrange Discharge Transport? (ex: facility, ambulance, wheelchair/stretcher, Medicaid, cab, other): No  Will the Patient Use Family Transport?: Yes  Transportation Name, Phone and Availability #1: Husband [name] [phone]  Support              Support: Pt/Family Updates re:POC or DC Plan, Patient Education, Huddle/team update  Info or Referral              Information or Referral to Community Resources: No Needs Identified  Positive SDOH Domains and Potential Barriers                   Medication Needs              Medication Needs: No Needs Identified Financial              Financial: No Needs Identified  Legal              Legal: No Needs Identified  Other              Other/None: No needs identified  Discharge Disposition  Selected Continued Care - Admitted Since 08/20/2023       [facility] Coordination complete.      Service Provider Services Address Phone Fax Patient Preferred    [facility] [address] [place] [address] [phone] --                      Vicki Daring, RN

## 2023-09-07 NOTE — Progress Notes
@   1410:  Pt's BP low again.  Pt reports pain at the new biliary site.  IR NP notified.  New orders to follow.    @ 1415:  IV Tylenol  infusing.

## 2023-09-07 NOTE — Unmapped
 Procedure Note    Vicki Buchanan is a 68 y.o. female.    Diagnosis: Septic Shock  Arterial Line    Date/Time: 09/07/2023 10:28 PM    Performed by: Marget Oneil LABOR, APRN-NP  Authorized by: Marget Oneil LABOR, APRN-NP  Consent: Verbal consent obtained  Risks and benefits: risks, benefits and alternatives were discussed  Consent given by: spouse  Patient identity confirmed: arm band and hospital-assigned identification number  Preparation: Patient was prepped and draped in the usual sterile fashion.  Indications: multiple ABGs, respiratory failure and hemodynamic monitoring  Location: right radial    Sedation:  Patient sedated: no  Allen's test normal: yes  Needle gauge: 20  Seldinger technique: Seldinger technique used  Number of attempts: 3  Post-procedure: dressing applied  Post-procedure CMS: normal and unchanged  Patient tolerance: patient tolerated the procedure well with no immediate complications              Oneil LABOR Marget, APRN-NP

## 2023-09-07 NOTE — Unmapped
 Patient/family declined:  Bed/chair alarm, W/in arms reach during toileting/showering, and Ambulation.    Patient/family educated on importance of intervention to their safety/quality of care. Patient/family continues to decline care.    Reason Why Patient/Family declined:  Patient wishes to ambulate independently when family is present.    Individualized safety and/or care plan implemented. If additional safety measures implemented, please list them.     Escalated to:  Unit coordinator/charge nurse.

## 2023-09-08 ENCOUNTER — Encounter: Admit: 2023-09-08 | Discharge: 2023-09-08 | Payer: MEDICARE

## 2023-09-08 ENCOUNTER — Inpatient Hospital Stay: Admit: 2023-09-08 | Discharge: 2023-09-08 | Payer: MEDICARE

## 2023-09-08 ENCOUNTER — Inpatient Hospital Stay: Admit: 2023-08-20 | Discharge: 2023-09-18 | Disposition: E | Payer: MEDICARE | Source: Ambulatory Visit

## 2023-09-08 DIAGNOSIS — Z9049 Acquired absence of other specified parts of digestive tract: Secondary | ICD-10-CM

## 2023-09-08 DIAGNOSIS — B961 Klebsiella pneumoniae [K. pneumoniae] as the cause of diseases classified elsewhere: Secondary | ICD-10-CM

## 2023-09-08 DIAGNOSIS — K219 Gastro-esophageal reflux disease without esophagitis: Secondary | ICD-10-CM

## 2023-09-08 DIAGNOSIS — B377 Candidal sepsis: Secondary | ICD-10-CM

## 2023-09-08 DIAGNOSIS — Z801 Family history of malignant neoplasm of trachea, bronchus and lung: Secondary | ICD-10-CM

## 2023-09-08 DIAGNOSIS — Z515 Encounter for palliative care: Secondary | ICD-10-CM

## 2023-09-08 DIAGNOSIS — N184 Chronic kidney disease, stage 4 (severe): Secondary | ICD-10-CM

## 2023-09-08 DIAGNOSIS — Z91013 Allergy to seafood: Secondary | ICD-10-CM

## 2023-09-08 DIAGNOSIS — J9811 Atelectasis: Secondary | ICD-10-CM

## 2023-09-08 DIAGNOSIS — C786 Secondary malignant neoplasm of retroperitoneum and peritoneum: Secondary | ICD-10-CM

## 2023-09-08 DIAGNOSIS — Z90722 Acquired absence of ovaries, bilateral: Secondary | ICD-10-CM

## 2023-09-08 DIAGNOSIS — C7801 Secondary malignant neoplasm of right lung: Secondary | ICD-10-CM

## 2023-09-08 DIAGNOSIS — N1330 Unspecified hydronephrosis: Secondary | ICD-10-CM

## 2023-09-08 DIAGNOSIS — Z833 Family history of diabetes mellitus: Secondary | ICD-10-CM

## 2023-09-08 DIAGNOSIS — E162 Hypoglycemia, unspecified: Secondary | ICD-10-CM

## 2023-09-08 DIAGNOSIS — Z90710 Acquired absence of both cervix and uterus: Secondary | ICD-10-CM

## 2023-09-08 DIAGNOSIS — C7989 Secondary malignant neoplasm of other specified sites: Secondary | ICD-10-CM

## 2023-09-08 DIAGNOSIS — Z87440 Personal history of urinary (tract) infections: Secondary | ICD-10-CM

## 2023-09-08 DIAGNOSIS — Z932 Ileostomy status: Secondary | ICD-10-CM

## 2023-09-08 DIAGNOSIS — D696 Thrombocytopenia, unspecified: Secondary | ICD-10-CM

## 2023-09-08 DIAGNOSIS — K859 Acute pancreatitis without necrosis or infection, unspecified: Secondary | ICD-10-CM

## 2023-09-08 DIAGNOSIS — J90 Pleural effusion, not elsewhere classified: Secondary | ICD-10-CM

## 2023-09-08 DIAGNOSIS — E538 Deficiency of other specified B group vitamins: Secondary | ICD-10-CM

## 2023-09-08 DIAGNOSIS — N261 Atrophy of kidney (terminal): Secondary | ICD-10-CM

## 2023-09-08 DIAGNOSIS — D631 Anemia in chronic kidney disease: Secondary | ICD-10-CM

## 2023-09-08 DIAGNOSIS — N132 Hydronephrosis with renal and ureteral calculous obstruction: Secondary | ICD-10-CM

## 2023-09-08 DIAGNOSIS — E8770 Fluid overload, unspecified: Secondary | ICD-10-CM

## 2023-09-08 DIAGNOSIS — E440 Moderate protein-calorie malnutrition: Secondary | ICD-10-CM

## 2023-09-08 DIAGNOSIS — J9601 Acute respiratory failure with hypoxia: Secondary | ICD-10-CM

## 2023-09-08 DIAGNOSIS — R6521 Severe sepsis with septic shock: Secondary | ICD-10-CM

## 2023-09-08 DIAGNOSIS — R791 Abnormal coagulation profile: Secondary | ICD-10-CM

## 2023-09-08 DIAGNOSIS — N179 Acute kidney failure, unspecified: Secondary | ICD-10-CM

## 2023-09-08 DIAGNOSIS — Z803 Family history of malignant neoplasm of breast: Secondary | ICD-10-CM

## 2023-09-08 DIAGNOSIS — Z888 Allergy status to other drugs, medicaments and biological substances status: Secondary | ICD-10-CM

## 2023-09-08 DIAGNOSIS — D62 Acute posthemorrhagic anemia: Secondary | ICD-10-CM

## 2023-09-08 DIAGNOSIS — Z66 Do not resuscitate: Secondary | ICD-10-CM

## 2023-09-08 DIAGNOSIS — E860 Dehydration: Secondary | ICD-10-CM

## 2023-09-08 DIAGNOSIS — Z9081 Acquired absence of spleen: Secondary | ICD-10-CM

## 2023-09-08 DIAGNOSIS — Z6820 Body mass index (BMI) 20.0-20.9, adult: Secondary | ICD-10-CM

## 2023-09-08 DIAGNOSIS — C181 Malignant neoplasm of appendix: Secondary | ICD-10-CM

## 2023-09-08 DIAGNOSIS — G893 Neoplasm related pain (acute) (chronic): Secondary | ICD-10-CM

## 2023-09-08 DIAGNOSIS — Z85038 Personal history of other malignant neoplasm of large intestine: Secondary | ICD-10-CM

## 2023-09-08 DIAGNOSIS — I160 Hypertensive urgency: Secondary | ICD-10-CM

## 2023-09-08 DIAGNOSIS — E559 Vitamin D deficiency, unspecified: Secondary | ICD-10-CM

## 2023-09-08 DIAGNOSIS — Z923 Personal history of irradiation: Secondary | ICD-10-CM

## 2023-09-08 LAB — BASIC METABOLIC PANEL
~~LOC~~ BKR ANION GAP: 20 — ABNORMAL HIGH (ref 3–12)
~~LOC~~ BKR ANION GAP: 22 — ABNORMAL HIGH (ref 3–12)
~~LOC~~ BKR ANION GAP: 24 — ABNORMAL HIGH (ref 3–12)
~~LOC~~ BKR BLD UREA NITROGEN: 33 mg/dL — ABNORMAL HIGH (ref 7–25)
~~LOC~~ BKR CALCIUM: 7.1 mg/dL — ABNORMAL LOW (ref 8.5–10.6)
~~LOC~~ BKR CALCIUM: 7.3 mg/dL — ABNORMAL LOW (ref 8.5–10.6)
~~LOC~~ BKR CALCIUM: 7.5 mg/dL — ABNORMAL LOW (ref 8.5–10.6)
~~LOC~~ BKR CHLORIDE: 101 mmol/L (ref 98–110)
~~LOC~~ BKR CHLORIDE: 100 mmol/L (ref 98–110)
~~LOC~~ BKR CHLORIDE: 100 mmol/L (ref 98–110)
~~LOC~~ BKR CO2: 15 mmol/L — ABNORMAL LOW (ref 21–30)
~~LOC~~ BKR CO2: 11 mmol/L — ABNORMAL LOW (ref 21–30)
~~LOC~~ BKR CO2: 12 mmol/L — ABNORMAL LOW (ref 21–30)
~~LOC~~ BKR CREATININE: 4 mg/dL — ABNORMAL HIGH (ref 0.40–1.00)
~~LOC~~ BKR CREATININE: 4.1 mg/dL — ABNORMAL HIGH (ref 0.40–1.00)
~~LOC~~ BKR GLOMERULAR FILTRATION RATE (GFR): 12 mL/min — ABNORMAL LOW (ref >60)
~~LOC~~ BKR GLOMERULAR FILTRATION RATE (GFR): 11 mL/min — ABNORMAL LOW (ref >60)
~~LOC~~ BKR GLOMERULAR FILTRATION RATE (GFR): 11 mL/min — ABNORMAL LOW (ref >60)
~~LOC~~ BKR GLUCOSE, RANDOM: 148 mg/dL — ABNORMAL HIGH (ref 70–100)
~~LOC~~ BKR GLUCOSE, RANDOM: 172 mg/dL — ABNORMAL HIGH (ref 70–100)
~~LOC~~ BKR POTASSIUM: 3.9 mmol/L (ref 3.5–5.1)
~~LOC~~ BKR POTASSIUM: 4 mmol/L (ref 3.5–5.1)
~~LOC~~ BKR POTASSIUM: 4.4 mmol/L — CL (ref 3.5–5.1)
~~LOC~~ BKR SODIUM, SERUM: 136 mmol/L — ABNORMAL LOW (ref 137–147)
~~LOC~~ BKR SODIUM, SERUM: 133 mmol/L — ABNORMAL LOW (ref 137–147)
~~LOC~~ BKR SODIUM, SERUM: 136 mmol/L — ABNORMAL LOW (ref 137–147)

## 2023-09-08 LAB — LACTIC ACID (BG - RAPID LACTATE)
~~LOC~~ BKR LACTIC ACID(SYRINGE): 7.8 mmol/L — ABNORMAL HIGH (ref 0.5–2.0)
~~LOC~~ BKR LACTIC ACID(SYRINGE): 8.2 mmol/L — ABNORMAL HIGH (ref 0.5–2.0)
~~LOC~~ BKR LACTIC ACID(SYRINGE): 8.1 mmol/L — ABNORMAL HIGH (ref 0.5–2.0)
~~LOC~~ BKR LACTIC ACID(SYRINGE): 8.2 mmol/L — ABNORMAL HIGH (ref 0.5–2.0)

## 2023-09-08 LAB — POC GLUCOSE
~~LOC~~ BKR POC GLUCOSE: 96 mg/dL — ABNORMAL LOW (ref 70–100)
~~LOC~~ BKR POC GLUCOSE: 13 mg/dL — CL (ref 70–100)
~~LOC~~ BKR POC GLUCOSE: 14 mg/dL — CL (ref 70–100)
~~LOC~~ BKR POC GLUCOSE: 191 mg/dL — ABNORMAL HIGH (ref 70–100)
~~LOC~~ BKR POC GLUCOSE: 68 mg/dL — ABNORMAL LOW (ref 70–100)
~~LOC~~ BKR POC GLUCOSE: 81 mg/dL (ref 70–100)
~~LOC~~ BKR POC GLUCOSE: 88 mg/dL (ref 70–100)
~~LOC~~ BKR POC GLUCOSE: 129 mg/dL — ABNORMAL HIGH (ref 70–100)
~~LOC~~ BKR POC GLUCOSE: 144 mg/dL — ABNORMAL HIGH (ref 70–100)
~~LOC~~ BKR POC GLUCOSE: 164 mg/dL — ABNORMAL HIGH (ref 70–100)
~~LOC~~ BKR POC GLUCOSE: 167 mg/dL — ABNORMAL HIGH (ref 70–100)
~~LOC~~ BKR POC GLUCOSE: 208 mg/dL — ABNORMAL HIGH (ref 70–100)

## 2023-09-08 LAB — URINALYSIS DIPSTICK REFLEX TO CULTURE
~~LOC~~ BKR GLUCOSE,UA: NEGATIVE /HPF — ABNORMAL HIGH (ref 11.0–15.0)
~~LOC~~ BKR LEUKOCYTES: NEGATIVE
~~LOC~~ BKR NITRITE: NEGATIVE
~~LOC~~ BKR URINE BILE: POSITIVE fL — AB (ref 7.0–11.0)
~~LOC~~ BKR URINE BLOOD: NEGATIVE
~~LOC~~ BKR URINE KETONE: NEGATIVE 10*3/uL — ABNORMAL LOW (ref 150–400)
~~LOC~~ BKR URINE PH: 6 /HPF (ref 5.0–8.0)
~~LOC~~ BKR URINE SPEC GRAVITY: 1 /HPF (ref 1.005–1.030)

## 2023-09-08 LAB — HIGH SENSITIVITY TROPONIN I, RANDOM
~~LOC~~ BKR HIGH SENSITIVITY TROPONIN I: 28 ng/L — ABNORMAL HIGH (ref <15)
~~LOC~~ BKR HIGH SENSITIVITY TROPONIN I: 57 ng/L — ABNORMAL HIGH (ref <15)

## 2023-09-08 LAB — COMPREHENSIVE METABOLIC PANEL
~~LOC~~ BKR AST: 123 U/L — ABNORMAL HIGH (ref 7–40)
~~LOC~~ BKR CALCIUM: 7.2 mg/dL — ABNORMAL LOW (ref 8.5–10.6)
~~LOC~~ BKR CHLORIDE: 104 mmol/L (ref 98–110)
~~LOC~~ BKR TOTAL BILIRUBIN: 15 mg/dL — ABNORMAL HIGH (ref 0.2–1.3)
~~LOC~~ BKR TOTAL PROTEIN: 5.4 g/dL — ABNORMAL LOW (ref 6.0–8.0)

## 2023-09-08 LAB — BLOOD GASES, ARTERIAL
~~LOC~~ BKR BASE DEFICIT-ART: 8.9 mmol/L — ABNORMAL HIGH (ref 0.5–2.0)
~~LOC~~ BKR BASE DEFICIT-ART: 11 mmol/L
~~LOC~~ BKR BASE DEFICIT-ART: 12 mmol/L
~~LOC~~ BKR BASE DEFICIT-ART: 13 mmol/L
~~LOC~~ BKR BASE DEFICIT-ART: 13 mmol/L — ABNORMAL HIGH (ref 7–25)
~~LOC~~ BKR BICARB, ART(CAL): 17 mmol/L — ABNORMAL LOW (ref 21.0–28.0)
~~LOC~~ BKR BICARB, ART(CAL): 15 mmol/L — ABNORMAL LOW (ref 21.0–28.0)
~~LOC~~ BKR BICARB, ART(CAL): 14 mmol/L — ABNORMAL LOW (ref 21.0–28.0)
~~LOC~~ BKR BICARB, ART(CAL): 13 mmol/L — ABNORMAL LOW (ref 21.0–28.0)
~~LOC~~ BKR FIO2 VALUE-ART: 21 % — ABNORMAL HIGH (ref <15)
~~LOC~~ BKR FIO2 VALUE-ART: 21 %
~~LOC~~ BKR FIO2 VALUE-ART: 60 %
~~LOC~~ BKR FIO2 VALUE-ART: 80 %
~~LOC~~ BKR O2 SAT-ART: 98 % (ref 95.0–99.0)
~~LOC~~ BKR O2 SAT-ART: 97 % (ref 95.0–99.0)
~~LOC~~ BKR O2 SAT-ART: 92 % — ABNORMAL LOW (ref 95.0–99.0)
~~LOC~~ BKR O2 SAT-ART: 94 % — ABNORMAL LOW (ref 95.0–99.0)
~~LOC~~ BKR PCO2-ART: 30 mmHg — ABNORMAL LOW (ref 35–45)
~~LOC~~ BKR PCO2-ART: 30 mmHg — ABNORMAL LOW (ref 35–45)
~~LOC~~ BKR PCO2-ART: 36 mmHg (ref 35–45)
~~LOC~~ BKR PCO2-ART: 39 mmHg (ref 35–45)
~~LOC~~ BKR PH-ART: 7.3 — ABNORMAL LOW (ref 7.35–7.45)
~~LOC~~ BKR PH-ART: 7.2 — ABNORMAL LOW (ref 7.35–7.45)
~~LOC~~ BKR PH-ART: 7.2 — ABNORMAL LOW (ref 7.35–7.45)
~~LOC~~ BKR PH-ART: 7.1 — CL (ref 7.35–7.45)
~~LOC~~ BKR PO2-ART: 106 mmHg — ABNORMAL HIGH (ref 80–100)
~~LOC~~ BKR PO2-ART: 93 mmHg (ref 80–100)
~~LOC~~ BKR PO2-ART: 72 mmHg — ABNORMAL LOW (ref 80–100)
~~LOC~~ BKR PO2-ART: 81 mmHg (ref 80–100)
~~LOC~~ BKR PO2-ART: 78 mmHg — ABNORMAL LOW (ref 80–100)

## 2023-09-08 LAB — POC BLOOD GAS ARTERIAL
~~LOC~~ BKR POC BASE DEF ART: 6 mmol/L — ABNORMAL LOW (ref 36–45)
~~LOC~~ BKR POC BICARB, ART: 19 mmol/L — ABNORMAL LOW (ref 21–28)
~~LOC~~ BKR POC CO2, ART: 30 mmHg — ABNORMAL LOW (ref 35–45)
~~LOC~~ BKR POC O2 SAT, ART: 93 % — ABNORMAL LOW (ref 95–99)
~~LOC~~ BKR POC O2, ART: 67 mmHg — ABNORMAL LOW (ref 80–100)
~~LOC~~ BKR POC PH, ART: 7.4 (ref 7.35–7.45)

## 2023-09-08 LAB — NT-PRO-BNP: ~~LOC~~ BKR NT-PRO-BNP: 751 pg/mL — ABNORMAL HIGH (ref <125)

## 2023-09-08 LAB — CBC
~~LOC~~ BKR HEMATOCRIT: 21 % — ABNORMAL LOW (ref 36.0–45.0)
~~LOC~~ BKR HEMATOCRIT: 22 % — ABNORMAL LOW (ref 36.0–45.0)
~~LOC~~ BKR HEMOGLOBIN: 7.3 g/dL — ABNORMAL LOW (ref 12.0–15.0)
~~LOC~~ BKR HEMOGLOBIN: 7.8 g/dL — ABNORMAL LOW (ref 12.0–15.0)
~~LOC~~ BKR HEMOGLOBIN: 8.6 g/dL — ABNORMAL LOW (ref 12.0–15.0)
~~LOC~~ BKR MCH: 32 pg (ref 26.0–34.0)
~~LOC~~ BKR MCH: 32 pg — ABNORMAL HIGH (ref 26.0–34.0)
~~LOC~~ BKR MCH: 32 pg (ref 26.0–34.0)
~~LOC~~ BKR MCHC: 34 g/dL — ABNORMAL HIGH (ref 32.0–36.0)
~~LOC~~ BKR MCHC: 32 g/dL (ref 32.0–36.0)
~~LOC~~ BKR MCV: 96 fL (ref 80.0–100.0)
~~LOC~~ BKR MCV: 97 fL (ref 80.0–100.0)
~~LOC~~ BKR MPV: 9.6 fL — ABNORMAL HIGH (ref 7.0–11.0)
~~LOC~~ BKR MPV: 10 fL (ref 7.0–11.0)
~~LOC~~ BKR MPV: 10 fL (ref 7.0–11.0)
~~LOC~~ BKR PLATELET COUNT: 92 10*3/uL — ABNORMAL LOW (ref 150–400)
~~LOC~~ BKR PLATELET COUNT: 76 10*3/uL — ABNORMAL LOW (ref 150–400)
~~LOC~~ BKR RBC COUNT: 2.2 10*6/uL — ABNORMAL LOW (ref 4.00–5.00)
~~LOC~~ BKR RBC COUNT: 2 10*6/uL — ABNORMAL LOW (ref 4.00–5.00)
~~LOC~~ BKR RBC COUNT: 2.3 10*6/uL — ABNORMAL LOW (ref 4.00–5.00)
~~LOC~~ BKR RBC COUNT: 2.6 10*6/uL — ABNORMAL LOW (ref 4.00–5.00)
~~LOC~~ BKR RDW: 19 % — ABNORMAL HIGH (ref 11.0–15.0)
~~LOC~~ BKR WBC COUNT: 24 10*3/uL — ABNORMAL HIGH (ref 4.50–11.00)
~~LOC~~ BKR WBC COUNT: 28 10*3/uL — ABNORMAL HIGH (ref 4.50–11.00)
~~LOC~~ BKR WBC COUNT: 42 10*3/uL — ABNORMAL HIGH (ref 4.50–11.00)
~~LOC~~ BKR WBC COUNT: 49 10*3/uL — ABNORMAL HIGH (ref 4.50–11.00)

## 2023-09-08 LAB — PROCALCITONIN: ~~LOC~~ BKR PROCALCITONIN: 32 ng/mL

## 2023-09-08 LAB — POC POTASSIUM: ~~LOC~~ BKR POC POTASSIUM: 3.4 mmol/L — ABNORMAL LOW (ref 3.5–5.1)

## 2023-09-08 LAB — PHOSPHORUS: ~~LOC~~ BKR PHOSPHORUS: 3.7 mg/dL — ABNORMAL HIGH (ref 2.0–4.5)

## 2023-09-08 LAB — FIBRINOGEN: ~~LOC~~ BKR FIBRINOGEN: 187 mg/dL — ABNORMAL LOW (ref 200–400)

## 2023-09-08 LAB — UREA NITROGEN-URINE RANDOM: ~~LOC~~ BKR UR UREA NIT, RAN: 238 mg/dL

## 2023-09-08 LAB — POC LACTATE: ~~LOC~~ BKR POC LACTIC ACID: 4.7 mmol/L — ABNORMAL HIGH (ref 0.5–2.0)

## 2023-09-08 LAB — MYOGLOBIN-CHEM: ~~LOC~~ BKR MYOGLOBIN, BLOOD: 56 ng/mL — ABNORMAL HIGH (ref 0–65)

## 2023-09-08 MED ORDER — MAGNESIUM SULFATE IN D5W 1 GRAM/100 ML IV PGBK
1 g | INTRAVENOUS | 0 refills | Status: CP
Start: 2023-09-08 — End: ?
  Administered 2023-09-08: 10:00:00 1 g via INTRAVENOUS

## 2023-09-08 MED ORDER — MORPHINE 4 MG/ML IV SYRG
2 mg | INTRAVENOUS | 0 refills | Status: DC | PRN
Start: 2023-09-08 — End: 2023-09-09
  Administered 2023-09-08: 23:00:00 2 mg via INTRAVENOUS

## 2023-09-08 MED ORDER — METOLAZONE 5 MG PO TAB
5 mg | Freq: Once | ORAL | 0 refills | Status: DC
Start: 2023-09-08 — End: 2023-09-08

## 2023-09-08 MED ORDER — MAGNESIUM SULFATE IN D5W 1 GRAM/100 ML IV PGBK
1 g | INTRAVENOUS | 0 refills | Status: CP
Start: 2023-09-08 — End: ?
  Administered 2023-09-08: 11:00:00 1 g via INTRAVENOUS

## 2023-09-08 MED ORDER — SODIUM BICARBONATE 1 MEQ/ML (8.4 %) IV SOLN
50 meq | Freq: Once | INTRAVENOUS | 0 refills | Status: CP
Start: 2023-09-08 — End: ?
  Administered 2023-09-08: 18:00:00 50 meq via INTRAVENOUS

## 2023-09-08 MED ORDER — MICAFUNGIN 100MG/100ML NS IVPB (MB+)
100 mg | INTRAVENOUS | 0 refills | Status: DC
Start: 2023-09-08 — End: 2023-09-08
  Administered 2023-09-08 (×2): 100 mg via INTRAVENOUS

## 2023-09-08 MED ORDER — LORAZEPAM 2 MG/ML IJ SOLN GROUP
1 mg | INTRAVENOUS | 0 refills | Status: DC | PRN
Start: 2023-09-08 — End: 2023-09-09
  Administered 2023-09-08: 23:00:00 1 mg via INTRAVENOUS

## 2023-09-08 MED ORDER — BISACODYL 10 MG RE SUPP
10 mg | Freq: Every day | RECTAL | 0 refills | PRN
Start: 2023-09-08 — End: ?

## 2023-09-08 MED ORDER — PHYTONADIONE IVPB
10 mg | INTRAVENOUS | 0 refills | Status: DC
Start: 2023-09-08 — End: 2023-09-08
  Administered 2023-09-08 (×2): 10 mg via INTRAVENOUS

## 2023-09-08 MED ORDER — SODIUM BICARBONATE 1MEQ/ML INFUSION
250 meq | INTRAVENOUS | 0 refills | Status: DC
Start: 2023-09-08 — End: 2023-09-09
  Administered 2023-09-08: 19:00:00 250 meq via INTRAVENOUS

## 2023-09-08 MED ORDER — BUMETANIDE 0.25 MG/ML IJ SOLN
3 mg | Freq: Once | INTRAVENOUS | 0 refills | Status: CP
Start: 2023-09-08 — End: ?
  Administered 2023-09-08: 15:00:00 3 mg via INTRAVENOUS

## 2023-09-08 MED ORDER — CHLOROTHIAZIDE SODIUM 500 MG IV SOLR
500 mg | Freq: Once | INTRAVENOUS | 0 refills | Status: CP
Start: 2023-09-08 — End: ?
  Administered 2023-09-08: 16:00:00 500 mg via INTRAVENOUS

## 2023-09-08 MED ORDER — DEXMEDETOMIDINE IN 0.9 % NACL 400 MCG/100 ML (4 MCG/ML) IV SOLN
.2-1 ug/kg/h | INTRAVENOUS | 0 refills | Status: DC
Start: 2023-09-08 — End: 2023-09-09

## 2023-09-08 MED ORDER — FENTANYL CITRATE (PF) 50 MCG/ML IJ SOLN
25 ug | INTRAVENOUS | 0 refills | Status: DC | PRN
Start: 2023-09-08 — End: 2023-09-09
  Administered 2023-09-08: 22:00:00 25 ug via INTRAVENOUS

## 2023-09-08 MED ORDER — SODIUM BICARBONATE 1MEQ/ML INFUSION
250 meq | INTRAVENOUS | 0 refills | Status: DC
Start: 2023-09-08 — End: 2023-09-08

## 2023-09-08 MED ORDER — FUROSEMIDE 10 MG/ML IJ SOLN
80 mg | Freq: Once | INTRAVENOUS | 0 refills | Status: CP
Start: 2023-09-08 — End: ?
  Administered 2023-09-08: 13:00:00 80 mg via INTRAVENOUS

## 2023-09-08 MED ORDER — MORPHINE 2 MG/ML IV SYRG
1 mg | Freq: Once | INTRAVENOUS | 0 refills | Status: CP
Start: 2023-09-08 — End: ?
  Administered 2023-09-08: 23:00:00 1 mg via INTRAVENOUS

## 2023-09-08 MED ORDER — GLYCOPYRROLATE 0.2 MG/ML IJ SOLN
.4 mg | Freq: Once | INTRAVENOUS | 0 refills | Status: CP
Start: 2023-09-08 — End: ?
  Administered 2023-09-08: 23:00:00 0.4 mg via INTRAVENOUS

## 2023-09-08 MED ORDER — NOREPINEPHRINE BITARTRATE-D5W 16 MG/250 ML (64 MCG/ML) IV SOLN
0-.5 ug/kg/min | INTRAVENOUS | 0 refills | Status: DC
Start: 2023-09-08 — End: 2023-09-09
  Administered 2023-09-08: 15:00:00 0.2 ug/kg/min via INTRAVENOUS

## 2023-09-08 MED ORDER — ALBUMIN, HUMAN 25 % IV SOLP
25 g | Freq: Once | INTRAVENOUS | 0 refills | Status: CP
Start: 2023-09-08 — End: ?
  Administered 2023-09-08: 15:00:00 25 g via INTRAVENOUS

## 2023-09-08 MED ORDER — PHENYLEPHRINE 160 MCG/ML IN NS IV DRIP (DBL CONC)
0-3 ug/kg/min | INTRAVENOUS | 0 refills | Status: DC
Start: 2023-09-08 — End: 2023-09-09
  Administered 2023-09-08 (×2): 0.5 ug/kg/min via INTRAVENOUS

## 2023-09-08 MED ORDER — GLYCOPYRROLATE 0.2 MG/ML IJ SOLN
.4 mg | INTRAVENOUS | 0 refills | Status: DC | PRN
Start: 2023-09-08 — End: 2023-09-09

## 2023-09-08 MED ADMIN — DEXTROSE 50 % IN WATER (D50W) IV SYRG [2365]: 50 mL | INTRAVENOUS | @ 01:00:00 | Stop: 2023-09-08 | NDC 00409751766

## 2023-09-08 MED ADMIN — DEXTROSE 50 % IN WATER (D50W) IV SYRG [2365]: 50 mL | INTRAVENOUS | @ 04:00:00 | Stop: 2023-09-08 | NDC 70092147550

## 2023-09-08 MED ADMIN — DEXMEDETOMIDINE IN 0.9 % NACL 400 MCG/100 ML (4 MCG/ML) IV SOLN [317250]: 0.2 ug/kg/h | INTRAVENOUS | @ 15:00:00 | Stop: 2023-09-08 | NDC 70121138901

## 2023-09-08 NOTE — Progress Notes
 MICU STAFF NOTE      I have seen, examined, and personally fully evaluated the patient, who is critically ill with the conditions listed below:    Principal Problem:    Cholangitis (CMS-HCC)  Active Problems:    Peritoneal carcinomatosis (CMS-HCC)    Cancer of appendix (CMS-HCC)    Hyperbilirubinemia    Moderate malnutrition    Septic shock (CMS-HCC)    Acute metabolic encephalopathy    Lactic acidosis    Elevated INR (international normalized ratio)    High anion gap metabolic acidosis      I spent 105 minutes (excluding time spent performing or supervising any procedures) providing and personally directing critical care services including:    Systems review and physical examination  Serial evaluation and management of hemodynamics  Serial evaluation and management of respiratory status  Review and management of ICU prophylaxis and core measures  Review of laboratory data  Review of telemetry data  Review of imaging studies  Review of medications  Fluid and electrolyte management      Exam:    Gen: mild distress, on NIPPV  HEENT: op dry,  CV:  tachycardic   Lungs:CTAB  Abd: soft, PTC drain in place   Ext: no c/c/e  Skin: no rashes  Neuro: confused     Scheduled Meds: Continuous Infusions:   dexMEDEtomidine  (PRECEDEX ) 400 mcg/NS 100 ml IV drip (premade) 1 mcg/kg/hr (2023-10-04 1609)    norepinephrine  (LEVOPHED ) 16 mg in dextrose  5% (D5W) 250 mL IV drip (quad conc) Stopped (04-Oct-2023 1401)    phenylephrine  (NEO-SYNEPHRINE) 40 mg in sodium chloride  0.9% 250 mL IV drip (dbl conc) 2.25 mcg/kg/min (10-04-23 1542)    sodium bicarbonate   1 mEq/mL IV infusion (max conc) 250 mEq (2023-10-04 1356)    vasopressin  (VASOSTRICT ) 20 units in sodium chloride  0.9% (NS) 100 mL IV infusion (std conc)(premade) Stopped (04-Oct-2023 1537)     PRN and Respiratory Meds:fentaNYL  citrate PF Q2H PRN, glycopyrrolate  Q30 MIN PRN, LORazepam   (ATIVAN )  injection Q2H PRN, morphine   injection syringe Q15 MIN PRN, [DISCONTINUED] ondansetron  Q6H PRN **OR** ondansetron  (ZOFRAN ) IV Q6H PRN, trimethobenzamide  Q6H PRN             Vitals:    2023-10-04 1522 Oct 04, 2023 1600 Oct 04, 2023 1700 2023/10/04 1804   BP:       BP Source:       Pulse: 76 87 67 75   Temp:  36.8 ?C (98.2 ?F) 37.1 ?C (98.8 ?F)    SpO2: 92% 92% 93%    O2 Device: CPAP/BiPAP CPAP/BiPAP CPAP/BiPAP (S) High flow nasal cannula   O2 Liter Flow:    2 Lpm   Weight:       Height:            Impression:  Septic shock  AKI on CKD  Metabolic acidosis   Lactic acidosis   Acute hypoxemic respiratory failure requiring NIPPV   Cholangitis s/p PTC drain upsize   Hx of metastatic mucinous adeno   Thrombocytopenia  Hyperbilirubinemia         Plan:   Continue NE and vasopressin  gtt  Stress dose steroids   Continue broad spectrum abx with zosyn  and zyvox   Klebsiella pneumoniae bacteremia   Candida fungemia   NIPPV. Patient currently on 90 % fi02. Discussed possibility of intubation due to hemodynamics and respiratory status.   Continue IV diuresis   Multi-focal pneumonia  Bicarb gtt   TTE  Plan to consult Palliative care for GOC discussion   Continue  supportive and ICU care.   Prognosis guarded.         Prophylaxis:   A) GI: PPI  B) Lines:  No  C) Urinary Catheter:  No  D) Antibiotic Usage:  No  E) VTE:  Pharmacological prophylaxis; SQ Heparin  and Mechanical prophylaxis; Sequential compression device      Staff name:  Rupert Epp, MD Date:  28-Sep-2023     Pulmonary/Critical Care/Sleep Medicine   Available on [employer]   Pager [phone]

## 2023-09-08 NOTE — Progress Notes
 Pulmonary / Critical Care Progress Note    Vicki Buchanan  Today's Date:  09-30-2023  Admission Date: 08/20/2023  LOS: 19 days    Principal Problem:    Cholangitis (CMS-HCC)  Active Problems:    Peritoneal carcinomatosis (CMS-HCC)    Cancer of appendix (CMS-HCC)    Hyperbilirubinemia    Moderate malnutrition    Septic shock (CMS-HCC)    Acute metabolic encephalopathy    Lactic acidosis    Elevated INR (international normalized ratio)    High anion gap metabolic acidosis    Brief Hospital Course:      Vicki Buchanan 68 y.o. 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     Direct admit from clinic for Hgb 6.9 & rising total bilirubin & leukocytosis. Underwent ERCP (08/21/23) with stent exchange . Oncology consulted, signed off w/ no plans for inpatient cancer treatment. Renal consulted but also signed off as her kidney function was relatively normal . IR consulted for L-biliary drain placement on 6/10 w/ subsequent upsize x3. Biliary fluid collected on 6/12 & 6/14 routine culture + coag-negative Staphylococcus, anaerobic culture + Veillonella parvula. ID was consulted. IR re-engaged on 6/20 for right sided biliary drain placement given persistent hyperbilirubinemia despite upsize of previous left drain placement. She was rapid responded 6/20 for AMS and hypotension attributed to septic shock.  She was given IVF without improvement in blood pressure despite being fluid responsive and was started on vasopressors.      On arrival to the MICU she was lethargic, withdrawing to pain and hypotensive requiring vasopressors. NiCOM w/ of LR and was not fluid responsive. She developed increased oxygen needs for which a POCUS was performed that was c/w pulmonary edema and was placed on NIPPV. Antibiotics broadened. Repeat ID workup in progress. ID following.    Today CXR c/f volume overload vs PNA. Will attempt aggressive diuresis w/ Bumex  and Albumin  & re-engage Nephrology. Patient slightly agitated on NIV, starting Precedex  gtt. Family agreeable to [facility] consultation. Continue ICU care.     Assessment/Plan:      NEURO  Acute metabolic encephalopathy  Cancer related pain  - likely secondary to septic shock  - on arrival to MICU, withdrawing to pain  - now oriented x4, following commands  PLAN  - oxycodone , fentanyl  & tylenol  for pain  - frequent neuro exams  - continue pain management  - Precedex  gtt for agitation     PULM  Acute respiratory respiratory failure with hypoxia  - Likely secondary to pulmonary edema from volume resuscitation in the setting of septic shock  - Required 1-2L O2 s/p biliary drain placement  - On arrival to MICU was requiring 10L  - ABG on arrival to MICU 7.34 / 30 / 106 / 17.2  - POCUS: B-lines bilaterally, small bilateral pleural effusions w/ associated atelectasis  - BNP 7,516  - Placed on NIV for 10/5 for WOB  - CXR: Development of moderate to severe diffuse mixed interstitial and alveolar opacities, likely multifocal pneumonia and/or aspiration. Edema could also contribute to this appearance. Development of small to moderate pleural effusions with bibasilar atelectasis.   - ABG: 7.29 / 30 / 93 / 15.1  PLAN:  - Increase NIV 12/5 w/ backup rate of 16  - Repeat ABG  - Gave Lasix  80 w/ some response.  Will attempt diuresis with 3mg  Bumex  and Diuril .       CV  Septic Shock  Lactic acidosis (worsening)  - ECHO 6/4: EF ~ 61%   - Developed worsening septic shock following biliary drain placement.  - Hypotensive during RR. S/p 1L NS with NiCOM (25.6% responsive), BP 63/45 (51), norepinephrine  started.  - On arrival to MICU, NiCOM repeated -> not fluid responsive  - EKG: Accelerated junctional rhythm  - LA 4.7 >> 8.5 >> 8.2  - Troponin peaked at  33  - Started on Levophed , Vaso & Hydrocort    Currently   Levophed  at 0.02   Vaso - off since 09-19-2023   Hydrocort  50mg  Q6hrs  - HR ~ 70s & SBP ~ 110s  PLAN  - Wean Levophed  for MAP >65  - Continue pressors   - Continue Hydrocort    - Trend LA   - IVF with NICOM guidance    GI/ONC  Cholangitis  Metastatic mucinous adenocarcinoma of the appendix  Peritoneal carcinomatosis  Malignant biliary obstruction  Hyperbilirubinemia  Diverting Loop Ileostomy in situ  Moderate Malnutrition  - diagnosed 2018  - Hyperbilirubinemia secondary to malignant biliary obstruction  - initially admitted for rising bili, leukocytosis & anemia  - recently hospitalized at [facility] and underwent an ERCP 08/06/2023 and pus was noted to be coming out of her bile duct.  - s/p HIPEC x 2  - s/p ERCP x3 with failed stents  - decreased ostomy output prompted CT A/P on 6/18 to evaluate for bowel obstruction  - 6/18 CT A/P: Volume overload. No high-grade bowel obstruction is suspected. Mildly distended stomach. Extensive peritoneal carcinomatosis, including in the region of ostomy. Left-sided internal/external biliary drain traversing though previously noted common bile duct stent. Persistent mild-to-moderate intrahepatic biliary dilation, L > R.  - 6/20 CT A/P: Interval placement of new internal/external biliary drain without perihepatic hematoma or hemoperitoneum. Otherwise, no significant change since previous study.  - S/p left biliary drain placement 6/10 since upsized x 3 but with persistent hepatic biliary ductal dilation prompting right biliary drain placement 09/07/2023  - AST/ ALT/ ALP: 118/ 55/ 138; tbili 17.3  - OP: 239ml/24hrs  PLAN:  - Oncology consulted, no plans for inpatient cancer treatment  - GI consulted previously; no plans for endoscopic evaluation this admission- recommended ongoing goals of care discussions  - Dietary consulted; recommend regular diet without prolonged NPO orders   - ID as below  - NPO sips with meds  - bowel regimen  - stop imodium , lomotil  RENAL  High Anion Gap Metabolic Acidosis  Normal Anion Gap Metabolic Acidosis  AKI on CKD4  - Baseline Cr 2.8-3.0  - Renal consulted but signed off given Cr was near baseline. Advised to f/u in clinic.   - Now Cr 3.96, BUN 35, Na 136, Co2 12, AG 20  - I/O not accurate, but has voided since presentation to MICU  PLAN:  - Strict I/O  - BMP this afternoon   - Trend LA as above  - Renally dose medications / avoid nephrotoxins as able  - Send urine lytes   - Re-engage Nephrology   - 3 bumex  + Diuril  5   - Albumin  25g/ 25%       ENDO  Severe Hypoglycemia  - likely secondary to no PO intake ~24 hrs v. Worsening liver function in the setting of septic shock, abdominal metastatic disease  - BG during RR 14, s/p 1 amp of D50 > 191  - On admission to  MICU, hypoglycemic again requiring amp of D50   - BG ~ 160s  PLAN:   - Q4 BG monitoring  - Hypoglycemic protocol     ID  Septic Shock  Leukocytosis (worsening)  Cholangitis  - On admit WBC 20.20   - Received Unasyn  6/10 - 6/20  - Developed worsening septic shock following biliary drain placement 6/20  - Biliary drain fluid 6/12 & 6/14: + coag-negative Staphylococcus, anaerobic culture + Veillonella parvula  - Procalcitonin 32.90  - MRSA negative  - Today WBC 37.40 & hypothermic 34.4  PLAN:  - ID following; appreciate assistance  - D/c unasyn  and start linezolid  & pip/tazo given progressive shock  - Resend blood cultures, UA/reflex  - Bair hugger  - Fungitell & Galacto     HEME  Acute Blood Loss Anemia  Elevated INR  - Hemoglobin slowly trending down s/p biliary drain placement  - CT A/P as above without sings of acute intraabdominal bleeding  - Right biliary drain site with moderate amount of sanguinous drainage on dressing which was redressed and pressure dressing applied. Small amount of serosanguinous drainage noted in drain bag.  - Hgb 7.3 > 6.7 >> 8.2  - INR uptrending 1.2 > 1.9 > 2.2  PLAN:  - Transfused 1u pRBCs  - Hold heparin   - CBC q4h  - DC Vitamin k     FEN  Fluids: NiCOM guided fluid resusciation  Electrolytes: K > 4, Mg > 2  Nutrition: NPO, sips with meds     Prophylaxis Review:  Lines:  Yes; Arterial Line; Indication:  Frequent blood draws and Continuous BP monitoring; Location:  Radial  Urinary Catheter:  No  DVT ppx: No, c/f bleeding  GI ppx: pantoprazole    Activity: PT/OT        Code Status: FULL   Disposition: Admit to ICU     This patient was seen and discussed with Dr. Angelena    Pt critically ill with above. I spent 70 minutes providing critical care services including:  performing a physical examination  serially reviewing laboratory, telemetry,  hemodynamic, oximetry, and respiratory data  reviewing radiographic images  reviewing medications  managing fluids/electrolytes, antibiotics, ICU prophylaxis, and mechanical ventilation   developing the overall plan of care.    Jayson Gailen Castor, APRN  Pulm/Critical Care  Pager [phone] or VoalteMe  M4 (2nd call/night) Pager [phone]  09/27/2023   __________________________________________________________________________________    Subjective:     Braylei Totino is a 68 y.o. female who is unable to participate in a full ROS at this time but does nod YES when asked if her breathing feels better.     Objective:     Medications:  Scheduled Meds:ERGOcalciferoL  (vitamin D2) (DRISDOL ) capsule 50,000 Units, 50,000 Units, Oral, Q7 Days  [Held by Provider] heparin  (porcine) PF syringe 5,000 Units, 5,000 Units, Subcutaneous, Q8H  hydrocortisone  sod succ (PF) (Solu-CORTEF ) injection 50 mg, 50 mg, Intravenous, Q6H*  linezolid   (ZYVOX )  600 mg/D5W 300 mL IVPB, 600 mg, Intravenous, Q12H*  pantoprazole  (PROTONIX ) injection 40 mg, 40 mg, Intravenous, API(88-78)  phytonadione  (vitamin K1 ) 10 mg in dextrose  5% (D5) 50 mL IVPB, 10 mg, Intravenous, Q24H*  piperacillin /tazobactam (ZOSYN ) 4.5 g in sodium chloride  0.9% (NS) 100 mL IVPB (MB+), 4.5 g, Intravenous, Q12H*  sodium chloride  0.9 % (flush) syringe 10 mL, 10 mL, Intra-catheter, BID  sodium chloride  0.9 % (flush) syringe 10 mL, 10 mL, Intra-catheter, BID  sodium chloride  0.9% IV bolus 500 mL, 500 mL, Intravenous, ONCE  Continuous Infusions:   norepinephrine  (LEVOPHED ) 4 mg in dextrose  5% (D5W) 250 mL IV drip (std conc) 0.02 mcg/kg/min (02-Oct-2023 0538)    vasopressin  (VASOSTRICT ) 20 units in sodium chloride  0.9% (NS) 100 mL IV infusion (std conc)(premade) Stopped (11-20-2023 0551)     PRN and Respiratory Meds:acetaminophen  Q6H PRN, alteplase  BID PRN, alum-mag hydroxide-simeth Q4H PRN **AND** [EXPIRED] lidocaine  hcl viscous ONCE, calcium  carbonate Q6H PRN, cetirizine  QDAY PRN, fentaNYL  citrate PF Q3H while awake PRN, flumazeniL  PRN, hydrOXYzine  HCL Q6H PRN, melatonin QHS PRN, nalOXone  PRN, nalOXone  PRN, ondansetron  Q6H PRN **OR** ondansetron  (ZOFRAN ) IV Q6H PRN, oxyCODONE  Q4H PRN, polyethylene glycol 3350  QDAY PRN, sennosides-docusate sodium  QDAY PRN, sodium chloride  0.9% TKO infusion PRN, trimethobenzamide  Q6H PRN                       Vital Signs: Last Filed                  Vital Signs: 24 Hour Range   BP: 100/66 (06/20 2100)  Temp: 36.4 ?C (97.6 ?F) 2023-10-02 0500)  Pulse: 68 Oct 02, 2023 0500)  Respirations: 25 PER MINUTE 2023/10/02 0500)  SpO2: 92 % 2023/10/02 0500)  O2%: 60 % 10/02/23 0333)  O2 Device: (P) CPAP/BiPAP Oct 02, 2023 0400)  O2 Liter Flow: 10 Lpm October 02, 2023 0111) BP: (63-167)/(30-100)   ABP: (98-148)/(48-62)   Temp:  [34.4 ?C (94 ?F)-36.7 ?C (98 ?F)]   Pulse:  [54-108]   Respirations:  [0 PER MINUTE-32 PER MINUTE]   SpO2:  [2 %-100 %]   O2%:  [60 %]   O2 Device: (P) CPAP/BiPAP  O2 Liter Flow: 10 Lpm   Intensity Pain Scale (Self Report): 4 (09/07/23 1002) Vitals:    09/05/23 1636 09/06/23 0431 09/07/23 0939   Weight: 62.1 kg (137 lb) 61.3 kg (135 lb 3.2 oz) 60.7 kg (133 lb 12.8 oz)           Intake/Output Summary:  (Last 24 hours)    Intake/Output Summary (Last 24 hours) at 10-02-2023 9394  Last data filed at 02-Oct-2023 0500  Gross per 24 hour   Intake 2238.73 ml   Output 1230 ml   Net 1008.73 ml         Physical Exam:      General: chronically ill, mild distress  Head: normocephalic, without obvious abnormality, atraumatic  Eyes: conjunctivae/corneas clear, PERRL  Lungs: rhonchi to diminished in all fields, NIV in place   Heart: regular rate and rhythm on monitor  Abdomen: soft, non-tender, bowel sounds active  Extremities: normal, atraumatic, no cyanosis or edema  Skin: no rashes or lesions on exposed skin  Neurologic: AOx4 but lethargic, anxious about NIV    Artificial airway:  NO but on NIV                                                                                         Ventilator/ Respiratory Therapy:  No     Vent weaning trial:  Not applicable      Laboratory:  LABS:  Recent Labs     09/06/23  0226 09/07/23  0257 09/07/23  2015 2023-10-02  0241   NA  135* 135* 138 136*   K 3.8 3.7 3.6 3.9   CL 103 100 104 104   CO2 22 24 16* 12*   GAP 10 11 18* 20*   BUN 34* 33* 36* 35*   CR 3.72* 3.73* 3.93* 3.96*   GLU 78 81 17* 157*   CA 8.0* 7.5* 7.2* 7.1*   ALBUMIN  2.1* 2.1* 1.8* 2.3*   MG 1.7 1.7 1.6 1.5*   PO4 3.8 3.8 3.7 4.4       Recent Labs     09/06/23  0226 09/07/23  0257 09/07/23  1744 09/07/23  2015 09/07/23  2229 06-Oct-2023  0241 10-06-23  0459   WBC 15.40* 14.10*  --  24.50* 28.40*  --  37.40*   HGB 8.7* 8.6* 7.6* 7.3* 6.7*  --  8.2*   HCT 25.1* 24.0* 22.0* 21.3* 19.9*  --  24.1*   PLTCT 132* 131*  --  92* 88*  --  78*   PT 14.1 14.2  --  21.8*  --  24.1*  --    INR 1.2 1.3*  --  1.9*  --  2.2*  --    PTT  --   --   --  41.7*  --   --   --    AST 100* 91*  --  123*  --  118*  --    ALT 62* 56  --  58*  --  55  --    ALKPHOS 181* 180*  --  180*  --  138*  --    MYOGLB  --   --   --  56  --   --   --       Estimated Creatinine Clearance: 11.7 mL/min (A) (based on SCr of 3.96 mg/dL (H)).  Vitals:    09/05/23 1636 09/06/23 0431 09/07/23 0939   Weight: 62.1 kg (137 lb) 61.3 kg (135 lb 3.2 oz) 60.7 kg (133 lb 12.8 oz)      Recent Labs     09/07/23  2229 06-Oct-2023  0241   PHART 7.34* 7.29*   PO2ART 106* 93 Radiology and Other Diagnostic Procedures Review:    Reviewed pertinent studies.

## 2023-09-08 NOTE — Consults
 PALLIATIVE CARE INPATIENT NOTE     Name: Vicki Buchanan            MRN: [id number]                DOB: 06-07-1955          Age: 68 y.o.  Admission Date: 08/20/2023             LOS: 19 days    ASSESSMENT/PLAN     Vicki Buchanan is a 68 y.o. 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 Per her husband Vicki Buchanan this is a tough decision for them, but [name] has been saying she has been at peace with her mortality. Vicki Buchanan shared that she is wanting the BIPAP off. Family admit that they do not think Vicki Buchanan would want to continue given her declining body.     Family states that they were getting towards final lines of treatment. Family admit that this hard as they are wanting her to be at peace, but also recognized that she has made recoveries in the past. I did share with family my worry on what is different currently is that she is facing multiple different organs failing from her kidney, liver, and respiratory status. Family acknowledged the domnio effect of getting one set back leading to another and then another.     I discussed continuing current care vs transition to hospice. I did share that without current life support time would be short likely hours to days, likely shorter in regards to hours. I shared that if there are people who wish to come see her that this occurs prior to transition to cmo.     Family asking about grandkids and how to discuss with them. I provided guidance regarding this.     RECOMMENDATIONS:   -stop pressors   -Ativan  1mg  q1hour PRN   -glyco 0.4 q2 hours PRN   -Haldol  1mg  q 4 Hours PRN   -change fentanyl  to 25-50mcg q15min PRN               -Would make fentanyl  avaliable more frequently as family would prefer to do a palliative wean off the BIPAP. Would pretreat with ativan , fentanyl , and glyco 10-15 minutes prior to starting the wean               -Wean BIPAP settings by 10-20%, wait 15-65minutes, if symptoms controlled, continue to wean down               -IF symptoms return such as restlessness, tachypnea, pause the wean and give more of the medications and wait for symptoms to be controlled prior to proceeding with wean again   -Given that I worry her time is short, I am unsure if a fentanyl  gtt would be a benefit as it can take a few hours to reach steady state  Discussed with ICU  team     PALLIATIVE CARE PLANNING     Advance Care Planning:   Identified Health Care Decision Maker:    DPOA - NA  TPOPP - Not discussed    PC Clinic - NA    Medication safety - NA    Disposition planning: likely eol at the hospital        SUBJECTIVE     CC/Reason for Visit:symptom management and goals of care     Additional history from: family b    History of Present Illness:  68 y.o. 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     Patient was somewhat alert during in person visit, would sometimes nod her head, but unsure how much she was following of the conversation as she would quickly fall back asleep. She did seem to wake up to nod to certain questions.     Family provided majority of history, see discussion above. Per family and RN staff she has been very restless with the BIPAP and multiple attempts at removing the BIPAP which then resulted in her shortness of breath    Past Medical History:    Acid reflux    Allergy    Bundle branch block    Cancer (CMS-HCC)    Cancer of appendix (CMS-HCC)    Cancer of colon (CMS-HCC)    Pneumonia    Sarcoma (CMS-HCC)     Surgical History:   Procedure Laterality Date    CESAREAN SECTION  1981    HX CHOLECYSTECTOMY  1995    LAPAROSCOPY  02/2017    biopsy peritoneal    COLONOSCOPY DIAGNOSTIC WITH SPECIMEN COLLECTION BY BRUSHING/ WASHING - FLEXIBLE N/A 03/19/2017    Performed by Malissa Norleen LABOR, MD at [facility] ENDO    COLONOSCOPY WITH BIOPSY - FLEXIBLE  03/19/2017    Performed by Malissa Norleen LABOR, MD at [facility] ENDO    EXPLORATORY LAPAROTOMY, DIVERTING LOOP ILEOSTOMY N/A 04/03/2017    Performed by Al-Kasspooles, Mazin, MD at [facility] OR    INSERTION TUNNELED CENTRAL VENOUS CATHETER - AGE 38 YEARS AND OVER Left 04/23/2017    Performed by Al-Kasspooles, Mazin, MD at [facility] OR    CYSTOURETHROSCOPY WITH INDWELLING URETERAL STENT INSERTION Bilateral 04/09/2018    Performed by Erskin Lenis, MD at [facility] OR    CYSTOURETHROSCOPY WITH URETERAL CATHETERIZATION WITH/ WITHOUT IRRIGATION/ INSTILLATION/ URETEROPYELOGRAPHY Bilateral 04/09/2018    Performed by Erskin Lenis, MD at [facility] OR    CYSTOURETHROSCOPY WITH INDWELLING URETERAL STENT EXCHANGE (RIGHT 6 x 26cm / LEFT 6 x 28cm) Bilateral 08/08/2018    Performed by Erskin Lenis, MD at [facility] OR    RETROGRADE UROGRAPHY WITH/ WITHOUT KUB Bilateral 08/08/2018    Performed by Erskin Lenis, MD at [facility] OR    ENDOSCOPIC RETROGRADE CHOLANGIOPANCREATOGRAPHY WITH SPHINCTEROTOMY/ PAPILLOTOMY N/A 09/16/2021    Performed by Rogers Kipper, MD at [facility] ENDO    ESOPHAGOGASTRODUODENOSCOPY WITH ENDOSCOPIC ULTRASOUND EXAMINATION - FLEXIBLE  09/16/2021    Performed by Rogers Kipper, MD at [facility] ENDO    ENDOSCOPIC RETROGRADE CHOLANGIOPANCREATOGRAPHY WITH PLACEMENT ENDOSCOPIC STENT INTO BILIARY/ PANCREATIC DUCT - EACH STENT 09/16/2021    Performed by Rogers Kipper, MD at [facility] ENDO    COLONOSCOPY DIAGNOSTIC WITH SPECIMEN COLLECTION BY BRUSHING/ WASHING - FLEXIBLE N/A 09/26/2021  Performed by Rogers Kipper, MD at [facility] OR    ENDOSCOPIC RETROGRADE CHOLANGIOPANCREATOGRAPHY WITH PLACEMENT ENDOSCOPIC STENT INTO BILIARY/ PANCREATIC DUCT - EACH STENT  09/29/2021    Performed by Noralyn Blossom, MD at [facility] ENDO    ENDOSCOPIC RETROGRADE CHOLANGIOPANCREATOGRAPHY with plastic stent exchange N/A 10/27/2021    Performed by Noralyn Blossom, MD at [facility] ENDO    ENDOSCOPIC RETROGRADE CHOLANGIOPANCREATOGRAPHY WITH ABLATION TUMOR/ POLYP/ OTHER LESION  10/27/2021    Performed by Noralyn Blossom, MD at [facility] ENDO    ENDOSCOPIC RETROGRADE CHOLANGIOPANCREATOGRAPHY WITH REMOVAL CALCULI/ DEBRIS FROM BILIARY/ PANCREATIC DUCT  10/27/2021    Performed by Noralyn Blossom, MD at [facility] ENDO    ENDOSCOPIC RETROGRADE CHOLANGIOPANCREATOGRAPHY  plastic stent replacement N/A 12/22/2021    Performed by Noralyn Blossom, MD at [facility] ENDO    ENDOSCOPIC RETROGRADE CHOLANGIOPANCREATOGRAPHY N/A 03/30/2022    Performed by Noralyn Blossom, MD at [facility] ENDO    ENDOSCOPIC RETROGRADE CHOLANGIOPANCREATOGRAPHY WITH REMOVAL AND EXCHANGE OF STENT BILIARY/ PANCREATIC DUCT - EACH STENT EXCHANGED N/A 03/30/2022    Performed by Noralyn Blossom, MD at [facility] ENDO    ENDOSCOPIC RETROGRADE CHOLANGIOPANCREATOGRAPHY WITH REMOVAL AND EXCHANGE OF STENT BILIARY/ PANCREATIC DUCT - EACH STENT EXCHANGED N/A 07/27/2022    Performed by Noralyn Blossom, MD at [facility] ENDO    ENDOSCOPIC RETROGRADE CHOLANGIOPANCREATOGRAPHY N/A 11/02/2022    Performed by Noralyn Blossom, MD at [facility] ENDO    ENDOSCOPIC RETROGRADE CHOLANGIOPANCREATOGRAPHY WITH REMOVAL AND EXCHANGE OF STENT BILIARY/ PANCREATIC DUCT - EACH STENT EXCHANGED N/A 11/02/2022    Performed by Noralyn Blossom, MD at [facility] ENDO    ENDOSCOPIC RETROGRADE CHOLANGIOPANCREATOGRAPHY WITH REMOVAL AND EXCHANGE OF STENT BILIARY/ PANCREATIC DUCT - EACH STENT EXCHANGED N/A 01/25/2023    Performed by Noralyn Blossom, MD at [facility] ENDO    ENDOSCOPIC RETROGRADE CHOLANGIOPANCREATOGRAPHY WITH REMOVAL AND EXCHANGE OF STENT BILIARY/ PANCREATIC DUCT - EACH STENT EXCHANGED N/A 04/26/2023    Performed by Noralyn Blossom, MD at [facility] ENDO    ENDOSCOPIC RETROGRADE CHOLANGIOPANCREATOGRAPHY WITH REMOVAL AND EXCHANGE OF STENT BILIARY/ PANCREATIC DUCT - EACH STENT EXCHANGED N/A 07/26/2023    Performed by Noralyn Blossom, MD at [facility] ENDO    ENDOSCOPIC RETROGRADE CHOLANGIOPANCREATOGRAPHY WITH REMOVAL CALCULI/ DEBRIS FROM BILIARY/ PANCREATIC DUCT N/A 08/16/2023    Performed by Noralyn Blossom, MD at [facility] ENDO    ENDOSCOPIC RETROGRADE CHOLANGIOPANCREATOGRAPHY WITH REMOVAL AND EXCHANGE OF STENT BILIARY/ PANCREATIC DUCT - EACH STENT EXCHANGED N/A 08/16/2023    Performed by Noralyn Blossom, MD at [facility] ENDO    ENDOSCOPIC RETROGRADE CHOLANGIOPANCREATOGRAPHY, WITH NEOPLASM OR POLYP ABLATION N/A 08/16/2023    Performed by Noralyn Blossom, MD at [facility] ENDO    ENDOSCOPIC RETROGRADE CHOLANGIOPANCREATOGRAPHY WITH REMOVAL AND EXCHANGE OF STENT BILIARY/ PANCREATIC DUCT - EACH STENT EXCHANGED N/A 08/21/2023    Performed by Rogers Kipper, MD at [facility] ENDO    COLON SURGERY  7/20    COLON SURGERY  7/20    HX APPENDECTOMY  1/10    Guess on date    HX CESAREAN SECTION  4/81    HX HYSTERECTOMY  7/20    HX LOWER ANTERIOR RESECTION OF COLON      7/20    HX SALPINGO-OOPHORECTOMY      ILEOSTOMY OR JEJUNOSTOMY Right     LIVER DONOR SURGERY  12/24/21    Liver stents    OVARY SURGERY      Removed 7/20    PORTACATH PLACEMENT  1/20    PR PNCRTECT PROX STOT W/PANCREATOJEJUNOSTOMY  7/20    STOMACH  SURGERY      7/20    TUNNELED VENOUS PORT PLACEMENT Left 1/19    URETER STENT PLACEMENT Bilateral      Social History     Tobacco Use    Smoking status: Never    Smokeless tobacco: Never   Vaping Use    Vaping status: Never Used   Substance Use Topics    Alcohol use: Not Currently     Comment: rarely    Drug use: Not Currently     Social History     Social History Narrative    Not on file     Occupation:  Hobbies or other: Living situation:  Marital status:   Significant loved ones:  Spiritual needs:     Family History:   Family History   Problem Relation Name Age of Onset    Diabetes Mother Murray     Cancer Father Lamar     Diabetes Brother Octaviano     Cancer-Lung Paternal Aunt Doris     Cancer-Breast Maternal Grandmother Sherrilyn     Diabetes Paternal Jeannine Savers      Family Status   Relation Name Status    Mother Murray Alive    Father Lamar Deceased    Brother Clinical biochemist Alive    PAunt Doris Deceased    MGM Sherrilyn Deceased    PGM Annabelle Deceased   No partnership data on file       ROS:    Review of systems not obtained from patient due to patient factors.        OBJECTIVE     Blood pressure 100/66, pulse (!) 131, temperature (!) 35.3 ?C (95.5 ?F), height 157.5 cm (5' 2), weight 60.7 kg (133 lb 12.8 oz), last menstrual period 10/09/2006, SpO2 (!) 89%, not currently breastfeeding.  Physical Exam  Constitutional: appears very ill and thin with jaundice skin  Respiratory: +accessory muscle use on BIPAP   Cardiovascular: tachycardia rate, regular rhythm, no murmur appreciated,  edema in bilateral lower extremities  Neuro: very lethargic  Skin: no rash or lesions on examined area       Lab Results:  CBC   Lab Results   Component Value Date/Time    WBC 42.80 (H) 09-20-23 11:59 AM    HGB 7.8 (L) 20-Sep-2023 11:59 AM    PLTCT 69 (L) Sep 20, 2023 11:59 AM     Lab Results   Component Value Date/Time    NEUT 81.4 (H) 2023/09/20 04:59 AM    ANC 30.50 (H) 09/20/2023 04:59 AM      Chemistries   Lab Results   Component Value Date/Time    NA 136 (L) September 20, 2023 07:37 AM    K 3.9 20-Sep-2023 07:37 AM    BUN 33 (H) 09-20-2023 07:37 AM    CR 4.01 (H) 2023/09/20 07:37 AM    GLU 148 (H) September 20, 2023 07:37 AM     Lab Results   Component Value Date/Time    CA 7.1 (L) 09/20/23 07:37 AM    PO4 4.4 09-20-23 02:41 AM    ALBUMIN  2.3 (L) 09/20/2023 02:41 AM    TOTPROT 5.4 (L) Sep 20, 2023 02:41 AM    ALKPHOS 138 (H) 09-20-23 02:41 AM    AST 118 (H) 09/20/2023 02:41 AM    ALT 55 20-Sep-2023 02:41 AM    TOTBILI 17.3 (H) 09/20/23 02:41 AM    GFR 12 (L) 09-20-2023 07:37 AM    GFRAA 28 (L) 09/24/2019 01:49 PM        Other Pertinent  Diagnostic Results:   CT abd/Pelvis:   Fluid overload, as evident by increasing anasarca, bilateral effusions,   mesenteric congestion, and trace ascites.     No high-grade bowel obstruction is suspected.     Mildly distended stomach.     Extensive peritoneal carcinomatosis, including in the region of ostomy.     Left-sided internal/external biliary stent traversing through previously   noted common bile duct stent. Persistent mild-to-moderate intrahepatic   biliary dilatation, left greater than right.     Palliative Care Data  Patient Location at Time of Consultation: Hospital - ICU (includes MICU, SICU, TICU, CICU, Neuro ICU, PICU)  Primary Diagnosis: Cancer (solid tumor): GI - Other    High medical decision making due to the following:  1 or more chronic illness with severe exacerbation, 1 or more chronic illness with progression, 1 or more chronic illness with side effects due to treatment, and 1 acute or chronic illness that poses a threat to life or bodily function  drug therapy requiring intensive monitoring for toxicity, IV controlled substances, and decision not to resuscitate or de-escalate care due to poor prognosis

## 2023-09-08 NOTE — Progress Notes
 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 without evidence of ascending cholangitis  Obstructive biliary tree; slender left intrahepatic ducts; tumor ingrowth into midportion of metal stent; LHD opacified with difficulty  08/03/2023 admitted to [facility] with acute cholangitis  Started Zosyn   08/04/2023 urine culture: >100K CFU Enterococcus faecalis (S) ampicillin   08/06/2023 underwent ERCP which revealed indwelling uncovered metal biliary stent in the left hepatic duct which was exchanged with plastic stent, mild stenosis within the metal stent at the proximal main bile duct, diffuse malignant stenoses of intrahepatic ducts; presence of purulence  08/07/2023 discontinued Zosyn ; started Augmentin ; discharged home  08/16/2023 underwent ERCP which revealed a protruding indwelling metallic stent in CBD seen coming out from ampulla with a plastic stent inside the metal stent, plastic stent was removed, occlusion cholangiogram: right posterior intrahepatic duct opacified; left hepatic system not opacified and totally; right anterior intrahepatic with branches partially opacified with significant narrowing beyond indwelling stent up to the hilum of liver; palced a Habiba catheter size 8 French, 2.7 and RFA x 2; large amount of tumor ingrowth inside the indwelling stent with large amount of tissue growing at the entrance of the stent which again this area also tumor destruction applied using RFA x 2; placed a 10 French, 10 cm double-pigtail plastic stent was successfully placed into the right intrahepatic duct; good outflow of bile and contrast seen through both the stents  08/20/2023 seen in oncology clinic [facility], noted to have persistent jaundice status post ERP and stent placement, received cefepime  x 1 dose  08/20/23: Directly admitted to the hospitalist service  Blood cultures: No growth x 2 sets  Urine culture: No growth  Started Zosyn   08/21/23 ERCP: uncovered metal stent in CBD extending out of ampulla with a double-pigtail plastic stent through the lumen of the uncovered metal stent, double-pigtail plastic stent removed, stone extraction balloon passed into left intrahepatic duct, balloon sweeps with extraction of a large amount of sludge, food debris and stent debris;  occlusion cholangiogram: Narrowing within the midportion of the stent consistent with tumor/tissue ingrowth.  RHD mildly dilated without stricture.  LHD opacified with difficulty.  Left intrahepatic ducts were slender and not well-visualized.  Palced 10 mm x 8 cm VIABIL stent within the previously placed uncovered biliary stent, upper end of the VIABIL stent was below the hilar confluence, good outflow of contrast after stent deployment   08/23/2023 CT ABD/pelvis: Development of small left pleural effusion and minimal basilar   bronchiolitis. No small bowel obstruction, ascites, or free air. Redemonstration of extensive partially calcified peritoneal metastatic disease. New small subcutaneous nodule right lateral pelvis which is probably a new metastasis. Right ischial pressure ulcer.   08/28/2023 IR: Placement of internal/external biliary drain (8 [place]) into the left ductal system  No meaningful drain output; progressive leukocytosis  08/30/2023 IR: Upsizing of internal/external biliary drain (8 Fr -> 12 Fr) within the left ductal system.  Some particulate matter identified on cholangiogram  - 6/12 biliary fluid GS: Moderate PMN, few budding yeast, few GPC in singles/pairs; routine culture + coag-negative Staphylococcus, anaerobic culture + Veillonella parvula  6/14 IR: Persistent biliary ductal dilation. Successful upsize to 46fr drain. Additional side holes cut in catheter to  accommodate length of indwelling stent  6/14 biliary drain fluid GS: Rare PMN, moderate budding yeast; routine culture + coag-negative Staphylococcus; anaerobic culture + Veillonella parvula  6/18 CT abdomen/pelvis w/o: Fluid overload, as evidenced by increasing anasarca, bilateral effusions, mesenteric congestion, and trace ascites.  No high-grade bowel obstruction is suspected.  Mildly distended stomach.  Extensive peritoneal carcinomatosis, including in the region of ostomy.  Left-sided internal/external biliary stent traversing through previously noted, bile duct stent.  Persistent mild to moderate intrahepatic biliary dilatation, left greater than right.  6/20 right-sided internal/external biliary drain placement  6/20 CT abdomen/pelvis W/O: Interval placement of a new internal/external biliary drain without perihepatic hematoma or hemoperitoneum.  Otherwise, there has been no significant change in the appearance of the abdomen and pelvis with extensive peritoneal metastatic disease.  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 with subsequent removal of ureteral stents  02/2017 diagnosed on laparoscopy  04/03/2017 underwent loop ileostomy creation  12/2017 started epacadosta and sirolimus   05/2018 discontinued epacadosta and sirolimus   05/27/2020 underwent laparoscopic HIPEC with mitomycin-C (trial at [facility])  10/07/2018 underwent second laparoscopic HIPEC  09/12/2018 underwent tumor debulking [facility])   05/2020 underwent radiation therapy to right lung metastases  04/16/2020 underwent cytoreductive surgery with ostomy revision due to tumor invasion  09/2020 started FOLFIRI and bevacizumab    09/07/2021 MRI/MRCP: Slight apparent increase in diffuse intrahepatic and extrahepatic biliary ductal dilatation to the level of the porta hepatis where there is   extrinsic compression on the extrahepatic common duct from metastases. No   biliary filling defect to suggest choledocholithiasis or biliary mass. Grossly unchanged extensive peritoneal carcinomatosis and right abdominal wall metastases. Partially visualized indeterminate lesion in the left sacrum, which may   be a metastasis or hemangioma.   09/16/2021 underwent ERCP this month of an uncovered metal stent in the CBD  09/2021 held bevacizumab    08/04/2023 CT ABD/pelvis: Extensive metastatic disease throughout the abdomen and pelvis including peritoneal implants with mass effect on adjacent structures. Left posterior pleural implant with invasion of the chest wall and adjacent smaller nodules. Common bile duct and biliary stent in place. Moderate to intrahepatic biliary ductal dilatation. Right lower quadrant ostomy encased with implants. No gas-filled dilated loops of bowel to suggest obstruction. Mild left hydronephrosis with prominent left extrarenal pelvis. Diffuse bladder wall thickening with adjacent fat stranding, possibly secondary to acute cystitis. Correlation with urinalysis is recommended. Distended vaginal cuff with air-fluid level. The posterior wall of the urinary bladder is adjacent to the vaginal cuff without preserved fat plane of separation.   08/20/2023 CEA 2565.5  Currently FOLFIRI cycle 40     AKI over CKD  Baseline creatinine ~1.5  6/17 creatinine up to 3.71     Recurrent urinary tract infections     Pelvic abscess following tumor debulking  10/2018 abscess cultures: Serratia and Candida albicans  Underwent IR abscess drain placement  Received course of cefepime  and fluconazole      Asplenia, surgical  02/2018 Pneumovax   09/2018 PCV13 (Prevnar)   09/2018 Haemophilus influenzae type B   Meningococcal  conjugate vaccine (Menveo) 09/30/2018 and 11/2018  Meningococcal serogroup B vaccine (MenB) 09/30/2018 and 10/28/2018    Recommendations:     Recent ERCPs confirmed further tumor growth with some obstruction of the existing metal stent within the CBD as well as a partially obstructed left hepatic ductal system, as evidenced on the cholangiogram.    Patient has not had fever but has developed progressive leukocytosis and had persistent elevation of hepatic transaminases, alkaline phosphatase and total bilirubin.  On 08/28/2023, she underwent placement of internal/external biliary drain into the left hepatic ductal system to decompress.  However, effectively no output from this new enteral/external biliary drain.  Drain was upsized 6/12 and 614 from 8Fr -> 18Fr catheter.  Now with better output.  Bilirubin increased again.  Right-sided internal/external biliary drain placed 6/20 followed by development of septic shock.    Please continue linezolid  and piperacillin nadine.   Start IV micafungin  100 mg daily to cover Candida.  Follow-up cultures and susceptibilities.  Monitor temp and WBC count.    We'll follow.    The patient is critically ill with septic shock requiring pressors, respiratory failure requiring noninvasive ventilation. I spent 45 minutes reviewing the patient's labs, imaging studies, and clinical status, examining the patient and providing recommendations regarding management of this critically ill patient.      Interval Hx     Patient became hypothermic yesterday  Developed hypotension and altered mental status, lactic acid elevated  Moved to ICU  Has been on BiPAP  Pressors Levophed  vasopressin , phenylephrine     Lethargic during my visit  BiPAP mask on  Able to answer some simple questions  Having difficulty breathing  Denies having pain  No nausea or vomiting    Ileostomy output in the past 24 hours: 250 mL.    WBC 39.7  Hemoglobin 8.3  Platelets 79  Creatinine 4.01  AST 118  ALT normal  ALP 138  T. bili 17.3-increased    Drain output over previous 24 hours:  Left internal/external biliary drain: 695 mL (flushed with 20 mL)  Right internal/external biliary drain: 35 mL.    2023/09/17 CXR: Development of mixed interstitial and alveolar opacities predominant on the right and in the perihilar areas-pulmonary edema versus pneumonia. Small to moderate pleural effusions with bibasilar atelectasis.    Discussed with Dr. Angelena and his team.    Antimicrobial Start date End date   Cefepime  6/2 6/2   Pip/tazo 6/2-6/6; 6/20 Active   Amp/sulb 6/11 6/20   Linezolid  6/20 Active                  Estimated Creatinine Clearance: 11.5 mL/min (A) (based on SCr of 4.01 mg/dL (H)).    Medications   Scheduled Meds:ERGOcalciferoL  (vitamin D2) (DRISDOL ) capsule 50,000 Units, 50,000 Units, Oral, Q7 Days  [Held by Provider] heparin  (porcine) PF syringe 5,000 Units, 5,000 Units, Subcutaneous, Q8H  hydrocortisone  sod succ (PF) (Solu-CORTEF ) injection 50 mg, 50 mg, Intravenous, Q6H*  linezolid   (ZYVOX )  600 mg/D5W 300 mL IVPB, 600 mg, Intravenous, Q12H*  pantoprazole  (PROTONIX ) injection 40 mg, 40 mg, Intravenous, API(88-78)  phytonadione  (vitamin K1 ) 10 mg in dextrose  5% (D5) 50 mL IVPB, 10 mg, Intravenous, Q24H*  piperacillin /tazobactam (ZOSYN ) 4.5 g in sodium chloride  0.9% (NS) 100 mL IVPB (MB+), 4.5 g, Intravenous, Q12H*  sodium chloride  0.9 % (flush) syringe 10 mL, 10 mL, Intra-catheter, BID  sodium chloride  0.9 % (flush) syringe 10 mL, 10 mL, Intra-catheter, BID  sodium chloride  0.9% IV bolus 500 mL, 500 mL,  Intravenous, ONCE    Continuous Infusions:   norepinephrine  (LEVOPHED ) 4 mg in dextrose  5% (D5W) 250 mL IV drip (std conc) 0.1 mcg/kg/min (10-03-23 0747)    vasopressin  (VASOSTRICT ) 20 units in sodium chloride  0.9% (NS) 100 mL IV infusion (std conc)(premade) Stopped (2023-10-03 0738) PRN and Respiratory Meds:acetaminophen  Q6H PRN, alteplase  BID PRN, alum-mag hydroxide-simeth Q4H PRN **AND** [EXPIRED] lidocaine  hcl viscous ONCE, calcium  carbonate Q6H PRN, cetirizine  QDAY PRN, fentaNYL  citrate PF Q3H while awake PRN, flumazeniL  PRN, hydrOXYzine  HCL Q6H PRN, melatonin QHS PRN, nalOXone  PRN, nalOXone  PRN, ondansetron  Q6H PRN **OR** ondansetron  (ZOFRAN ) IV Q6H PRN, oxyCODONE  Q4H PRN, polyethylene glycol 3350  QDAY PRN, sennosides-docusate sodium  QDAY PRN, sodium chloride  0.9% TKO infusion PRN, trimethobenzamide  Q6H PRN      Allergies     Allergies   Allergen Reactions    Indomethacin  SEE COMMENTS     Pain for several days after suppository post ERCP. Tolerated IV but not PR    Shrimp SEE COMMENTS     Eye irritation. Namely blood vessel issues in the eyes        Physical Examination                          Vital Signs: Last                  Vital Signs: 24 Hour Range   BP: 100/66 (06/20 2100)  Temp: 36.4 ?C (97.6 ?F) 03-Oct-2023 0500)  Pulse: 69 10/03/23 0733)  Respirations: 22 PER MINUTE October 03, 2023 0733)  SpO2: 90 % 10/03/2023 0733)  O2%: 60 % 10/03/23 0733)  O2 Device: CPAP/BiPAP 03-Oct-2023 0733)  O2 Liter Flow: 10 Lpm 10-03-23 0111) BP: (63-167)/(30-100)   ABP: (86-148)/(43-62)   Temp:  [34.4 ?C (94 ?F)-36.7 ?C (98 ?F)]   Pulse:  [54-108]   Respirations:  [0 PER MINUTE-32 PER MINUTE]   SpO2:  [2 %-100 %]   O2%:  [60 %]   O2 Device: CPAP/BiPAP  O2 Liter Flow: 10 Lpm     Gen: Alert, lethargic, on BiPAP  HEENT: Scleral icterus present  Lungs: Crackles bilaterally anteriorly and laterallyt  Heart: Regular, no murmur  Abd: + BS, soft, no tenderness to palpation, moderately distended, right and left left internal/external biliary drains in place, ostomy intact  Ext: 1+ bilateral lower extremity edema  Skin: Jaundice, no acute rash  Musculoskel: No warm or swollen joints  Psych: Unable to assess    Lines: Port-A-cath accessed, unremarkable aside    Drains/tubes:  Ileostomy RLQ  Left int/ext biliary drain  Right int/ext biliary drain    Lab Review   Hematology  Recent Labs     09/07/23  0257 09/07/23  1744 09/07/23  2015 09/07/23  2229 10/03/2023  0241 03-Oct-2023  0459 03-Oct-2023  0737   WBC 14.10*  --  24.50* 28.40*  --  37.40* 39.70*   HGB 8.6*   < > 7.3* 6.7*  --  8.2* 8.3*   HCT 24.0*   < > 21.3* 19.9*  --  24.1* 24.3*   PLTCT 131*  --  92* 88*  --  78* 79*   PTT  --   --  41.7*  --   --   --   --    INR 1.3*  --  1.9*  --  2.2*  --   --     < > = values in this interval not displayed.     Chemistry  Recent Labs  09/07/23  0257 09/07/23  2015 Oct 02, 2023  0241 10-02-23  0737   NA 135* 138 136* 136*   K 3.7 3.6 3.9 3.9   CL 100 104 104 101   CO2 24 16* 12* 15*   BUN 33* 36* 35* 33*   CR 3.73* 3.93* 3.96* 4.01*   GFR 13* 12* 12* 12*   GLU 81 17* 157* 148*   CA 7.5* 7.2* 7.1* 7.1*   PO4 3.8 3.7 4.4  --    ALBUMIN  2.1* 1.8* 2.3*  --    ALKPHOS 180* 180* 138*  --    AST 91* 123* 118*  --    ALT 56 58* 55  --    TOTBILI 14.4* 15.4* 17.3*  --        Microbiology, Radiology and other Diagnostics Review   Microbiology data reviewed.    Pertinent radiology images viewed.       Clem JINNY Sheen, MD   Contact via [facility]  Transplant & Immunocompromised Infectious Diseases

## 2023-09-08 NOTE — Progress Notes
 Renal  Progress Note    Name:  Vicki Buchanan   Today's Date:  Sep 26, 2023  Admission Date: 08/20/2023  LOS: 19 days                     Assessment/Plan:    Principal Problem:    Cholangitis (CMS-HCC)  Active Problems:    Peritoneal carcinomatosis (CMS-HCC)    Cancer of appendix (CMS-HCC)    Hyperbilirubinemia    Moderate malnutrition    Septic shock (CMS-HCC)    Acute metabolic encephalopathy    Lactic acidosis    Elevated INR (international normalized ratio)    High anion gap metabolic acidosis        CKDG3b/G4  - Baseline Cr 2.8-3.0  - UA with 2+ turbid, 2+ protein, 3+ LE  - UPCR 0.6  - CT abdomen obtained 3/11 showing mild bilateral renal atrophy, with left greater than right; no evidence of obstruction/hydronephrosis  - Suspected etiology of CKD: ATI related to dehydration, sepsis. Obstructive uropathy. Relative hypotension. Potential medication induced.   - Overall renal function stable at time of hospitalization     Dehydration (chronic)  - Presumed 2/2 ostomy output  - Bicarb stable 26, Na slightly below goal  - Receiving IVF PRN as outpatient, usually thrice weekly 1-2 liters     Hypomagnesemia (chronic)  - 2/2 losses  - obtaining IV mag infusions as needed, most recent level 1.5     Anemia  - Hgb 6.9  - %sat 27, TIBC 222, Ferritin 1645     Biliary obstruction  Cholangitis  Septic shock  - s/p ERCP x3, with failed stents  - now s/p PTC with drain upsize from 12 to 59F on  6/13, additional revisions after this  - Bacteremia with hypotension/septic shock and hypoxic resp failure 6/20     Recommendations:  Had a chance to meet with husband and family who were present at bedside.  We discussed Vicki Buchanan's respiratory status that is worsening and impending need for intubation.  Husband shared that they did not think that intubation or aggressive forms of life support would be within the patient's goals of care and would not provide any additional quality of life.    Additionally, with her being in septic shock I would anticipate her developing an acute kidney injury.  While we can continue to monitor for this and optimize her BP to prevent / limit severeity, I also do not think that dialysis would be in the patient's best interest as this would only prolong life and not provide any additional quality of life.     I agree with IV diuretic challenges as needed to improve respiratory status    Would limit IVF as able to prevent additional overload    Goals of care conversations warranted.     Husband verbalized understanding and states that he did not feel it would be in the best interest to pursue these aggressive measures and is waiting for additional conversations with family prior to making any goals of care decisions.    Discussed care directly with ICU team                  Patient is critically ill with:   septic shock, evolving AKI, multi-system disease The patient is at high risk for significant morbidity and mortality. I spent 45 minutes of providing and personally directing critical care services and coordinating care with ICU.  This includes time spent at patient's bedside, and serial  assessment of labs, imaging studies and interpreting hemodynamic data.This also includes management of fluids, electrolytes, acid-base and personal discussions with the critical care team     ________________________________________________________________________    Subjective  Vicki Buchanan is a 68 y.o. female.  Patient had care escalated to ICU after development of septic shock presumed secondary to cholangitis and clear etiology.  Now on multiple pressors in ICU with quickly worsening respiratory status and concern for impending need for intubation.  Plan of care discussed directly with ICU team and family.  Vicki Buchanan seemed comfortable in the room in no visible distress with BiPAP in place.  Asleep at the time of my encounter    Medications  Scheduled Meds:ERGOcalciferoL  (vitamin D2) (DRISDOL ) capsule 50,000 Units, 50,000 Units, Oral, Q7 Days  [Held by Provider] heparin  (porcine) PF syringe 5,000 Units, 5,000 Units, Subcutaneous, Q8H  hydrocortisone  sod succ (PF) (Solu-CORTEF ) injection 50 mg, 50 mg, Intravenous, Q6H*  linezolid   (ZYVOX )  600 mg/D5W 300 mL IVPB, 600 mg, Intravenous, Q12H*  micafungin  (MYCAMINE ) 100 mg in sodium chloride  0.9% (NS) 100 mL IVPB (MB+), 100 mg, Intravenous, Q24H*  pantoprazole  (PROTONIX ) injection 40 mg, 40 mg, Intravenous, API(88-78)  PHENYLEPHRINE  HCL 10 MG/ML IJ SOLN (Cabinet Override), , , NOW  PHENYLEPHRINE  HCL 10 MG/ML IJ SOLN (Cabinet Override), , , NOW  piperacillin /tazobactam (ZOSYN ) 4.5 g in sodium chloride  0.9% (NS) 100 mL IVPB (MB+), 4.5 g, Intravenous, Q12H*  sodium chloride  0.9 % (flush) syringe 10 mL, 10 mL, Intra-catheter, BID  sodium chloride  0.9 % (flush) syringe 10 mL, 10 mL, Intra-catheter, BID  sodium chloride  0.9% IV bolus 500 mL, 500 mL, Intravenous, ONCE  WATER  FOR INJECTION, STERILE IJ SOLN (Cabinet Override), , , NOW    Continuous Infusions:   dexMEDEtomidine  (PRECEDEX ) 400 mcg/NS 100 ml IV drip (premade) 0.5 mcg/kg/hr (09/29/2023 1104)    norepinephrine  (LEVOPHED ) 16 mg in dextrose  5% (D5W) 250 mL IV drip (quad conc) Stopped (2023-09-29 1401)    phenylephrine  (NEO-SYNEPHRINE) 40 mg in sodium chloride  0.9% 250 mL IV drip (dbl conc) 2.5 mcg/kg/min (2023-09-29 1426)    sodium bicarbonate   1 mEq/mL IV infusion (max conc) 250 mEq (September 29, 2023 1356)    vasopressin  (VASOSTRICT ) 20 units in sodium chloride  0.9% (NS) 100 mL IV infusion (std conc)(premade) 1.8 Units/hr (10/15/2023 1248)     PRN and Respiratory Meds:acetaminophen  Q6H PRN, alteplase  BID PRN, alum-mag hydroxide-simeth Q4H PRN **AND** [EXPIRED] lidocaine  hcl viscous ONCE, calcium  carbonate Q6H PRN, cetirizine  QDAY PRN, fentaNYL  citrate PF Q3H while awake PRN, hydrOXYzine  HCL Q6H PRN, melatonin QHS PRN, nalOXone  PRN, ondansetron  Q6H PRN **OR** ondansetron  (ZOFRAN ) IV Q6H PRN, oxyCODONE  Q4H PRN, polyethylene glycol 3350  QDAY PRN, sennosides-docusate sodium  QDAY PRN, sodium chloride  0.9% TKO infusion PRN, trimethobenzamide  Q6H PRN      Objective                       Vital Signs: Last Filed                 Vital Signs: 24 Hour Range   BP: 100/66 (06/20 2100)  Temp: 36.4 ?C (97.5 ?F) 09/29/2023 1400)  Pulse: 66 09-29-2023 1400)  Respirations: 18 PER MINUTE 2023/09/29 1400)  SpO2: 95 % 29-Sep-2023 1400)  O2%: 90 % 09-29-2023 1400)  O2 Device: CPAP/BiPAP 09-29-2023 1400)  O2 Liter Flow: 10 Lpm 09/29/2023 0111) BP: (63-101)/(30-66)   ABP: (80-148)/(43-62)   Temp:  [34.4 ?C (94 ?F)-36.5 ?C (97.7 ?F)]   Pulse:  [55-131]   Respirations:  [  0 PER MINUTE-32 PER MINUTE]   SpO2:  [2 %-98 %]   O2%:  [60 %-90 %]   O2 Device: CPAP/BiPAP  O2 Liter Flow: 10 Lpm     Vitals:    09/05/23 1636 09/06/23 0431 09/07/23 0939   Weight: 62.1 kg (137 lb) 61.3 kg (135 lb 3.2 oz) 60.7 kg (133 lb 12.8 oz)       Intake/Output Summary:  (Last 24 hours)    Intake/Output Summary (Last 24 hours) at 2023-10-04 1434  Last data filed at October 04, 2023 1100  Gross per 24 hour   Intake 2237.11 ml   Output 490 ml   Net 1747.11 ml           Physical Exam  General - no acute distress, acutely ill-appearing  HEENT -BiPAP in place, nose midline,  Chest -labored breathing  Skin without visible rashes or lesions      Lab Review  Pertinent labs reviewed    Point of Care Testing  (Last 24 hours)  Glucose: (!) 172 (10/04/23 1159)  POC Glucose (Download): (!) 164 (2023-10-04 1159)    Radiology and other Diagnostics Review:    No pertinent radiology.    Lorrene Fuelling, MD, MHS  Department of Internal Medicine - Nephrology

## 2023-09-08 NOTE — Care Coordination-Inpatient
 Further note to follow     If patient were to transition to CMO would do the following:     -stop pressors   -Ativan  1mg  q1hour PRN   -glyco 0.4 q2 hours PRN   -Haldol  1mg  q 4 Hours PRN   -change fentanyl  to 25-50mcg q15min PRN   -Would make fentanyl  avaliable more frequently as family would prefer to do a palliative wean off the BIPAP. Would pretreat with ativan , fentanyl , and glyco 10-15 minutes prior to starting the wean   -Wean BIPAP settings by 10-20%, wait 15-52minutes, if symptoms controlled, continue to wean down   -IF symptoms return such as restlessness, tachypnea, pause the wean and give more of the medications and wait for symptoms to be controlled prior to proceeding with wean again   -Given that I worry her time is short, I am unsure if a fentanyl  gtt would be a benefit as it can take a few hours to reach steady state    I am available if questions arise or symptoms are needing assistance in management.       Damien Lauth DO   Palliative Care  Available by [facility] [place] and AMS

## 2023-09-08 NOTE — Progress Notes
 Interventional Radiology Follow Up Note    Admission Date: 08/20/2023  LOS: 19 days                      Principal Problem:    Cholangitis (CMS-HCC)  Active Problems:    Peritoneal carcinomatosis (CMS-HCC)    Cancer of appendix (CMS-HCC)    Hyperbilirubinemia    Moderate malnutrition    Septic shock (CMS-HCC)    Acute metabolic encephalopathy    Lactic acidosis    Elevated INR (international normalized ratio)    High anion gap metabolic acidosis      Procedure completed: Vicki Buchanan is a 68 y.o. female who is status post biliary drain  (right side) on 6/20 for Cholangitis, 6/10 Placement of Left sided    Assessment:  - Bilirubin today: 17.3   Yesterday: 15.4   Overall trend: Increased  - Liver enzyme trend since intervention: decreased  - WBC today: 37.40  Yesterday: 28.40  - Drainage appearance: Brown, bilious  L PTC flushes easily  R PTC flushes easily  - Site/dressing appearance: CDI  - PT lethargic, family at bedside    Plan:  - While Inpatient:  Continue to DD. IR will VR until PTC able to be capped.     - After Discharge:   Our drains need to be exchanged every 3 months for infection control for as long as Vicki Buchanan has them.  Continue to flush drain BID and PRN with 10mls NS after discharge.       General Care Recommendations:  1. Flush drain with 10 ml Normal Saline twice a day and as needed to maintain patency for the life of the drain. (Ordered and present on MAR)    2. Notify Interventional Radiology: the drainage bag or catheter become damaged, no drainage observed in drainage bag, bleeding or drainage around tube site, skin breakdown around tube site, catheter position changed/pulled, catheter has fallen out or has been pulled out.    3. Change dressing weekly and as needed if soiled.    4. The tube itself is transhepatic and should remain in place a minimum of 6 weeks since initial placement date for tract maturation regardless of need or utility.       We appreciate being able to participate in this patient's care. Please page with any questions or concerns.    Lacinda DELENA Char, APRN-NP   Pgr [phone]    IR Team Pager [phone] (After-hours and Weekends)  ________________________________________________________________________    Subjective  Patient is resting in bed. Family at bedside. PTC to DD. RN at bedside.    Review of Systems  UTA    Medications  Scheduled Meds:chlorothiazide  injection 500 mg, 500 mg, Intravenous, ONCE  ERGOcalciferoL  (vitamin D2) (DRISDOL ) capsule 50,000 Units, 50,000 Units, Oral, Q7 Days  [Held by Provider] heparin  (porcine) PF syringe 5,000 Units, 5,000 Units, Subcutaneous, Q8H  hydrocortisone  sod succ (PF) (Solu-CORTEF ) injection 50 mg, 50 mg, Intravenous, Q6H*  linezolid   (ZYVOX )  600 mg/D5W 300 mL IVPB, 600 mg, Intravenous, Q12H*  pantoprazole  (PROTONIX ) injection 40 mg, 40 mg, Intravenous, API(88-78)  piperacillin /tazobactam (ZOSYN ) 4.5 g in sodium chloride  0.9% (NS) 100 mL IVPB (MB+), 4.5 g, Intravenous, Q12H*  sodium chloride  0.9 % (flush) syringe 10 mL, 10 mL, Intra-catheter, BID  sodium chloride  0.9 % (flush) syringe 10 mL, 10 mL, Intra-catheter, BID  sodium chloride  0.9% IV bolus 500 mL, 500 mL, Intravenous, ONCE    Continuous Infusions:   norepinephrine  (LEVOPHED ) 16  mg in dextrose  5% (D5W) 250 mL IV drip (quad conc) 0.18 mcg/kg/min (20-Sep-2023 1002)    vasopressin  (VASOSTRICT ) 20 units in sodium chloride  0.9% (NS) 100 mL IV infusion (std conc)(premade) 1.8 Units/hr (2023/09/20 0949)     PRN and Respiratory Meds:acetaminophen  Q6H PRN, alteplase  BID PRN, alum-mag hydroxide-simeth Q4H PRN **AND** [EXPIRED] lidocaine  hcl viscous ONCE, calcium  carbonate Q6H PRN, cetirizine  QDAY PRN, fentaNYL  citrate PF Q3H while awake PRN, hydrOXYzine  HCL Q6H PRN, melatonin QHS PRN, nalOXone  PRN, ondansetron  Q6H PRN **OR** ondansetron  (ZOFRAN ) IV Q6H PRN, oxyCODONE  Q4H PRN, polyethylene glycol 3350  QDAY PRN, sennosides-docusate sodium  QDAY PRN, sodium chloride  0.9% TKO infusion PRN, trimethobenzamide  Q6H PRN      Objective                       Vital Signs: Last Filed                 Vital Signs: 24 Hour Range   BP: 100/66 (06/20 2100)  Temp: 35.3 ?C (95.5 ?F) 20-Sep-2023 0900)  Pulse: 69 Sep 20, 2023 0900)  Respirations: 20 PER MINUTE 2023-09-20 0900)  SpO2: 91 % Sep 20, 2023 0900)  O2%: 60 % 20-Sep-2023 0733)  O2 Device: CPAP/BiPAP 09/20/23 0900)  O2 Liter Flow: 10 Lpm 2023/09/20 0111) BP: (63-167)/(30-100)   ABP: (86-148)/(43-62)   Temp:  [34.4 ?C (94 ?F)-36.5 ?C (97.7 ?F)]   Pulse:  [54-108]   Respirations:  [0 PER MINUTE-32 PER MINUTE]   SpO2:  [2 %-100 %]   O2%:  [60 %]   O2 Device: CPAP/BiPAP  O2 Liter Flow: 10 Lpm     Vitals:    09/05/23 1636 09/06/23 0431 09/07/23 0939   Weight: 62.1 kg (137 lb) 61.3 kg (135 lb 3.2 oz) 60.7 kg (133 lb 12.8 oz)         Intake/Output Summary:  (Last 24 hours)    Intake/Output Summary (Last 24 hours) at 20-Sep-2023 1006  Last data filed at 2023-09-20 0900  Gross per 24 hour   Intake 2237.11 ml   Output 510 ml   Net 1727.11 ml           Physical Exam  General appearance: fatigued  Neurologic: UTA  Lungs: non labored on CPAP  Heart: regular rate and rhythm  Abdomen: soft, non-tender. Bowel sounds normal. No masses,  no organomegaly  Extremities: extremities normal, atraumatic, no cyanosis or edema  PTC to DD x 2    Lab/Radiology/Other Diagnostic Tests:  Labs:  24-hour labs:    Results for orders placed or performed during the hospital encounter of 08/20/23 (from the past 24 hours)   HEMOGLOBIN & HEMATOCRIT    Collection Time: 09/07/23  5:44 PM   Result Value Ref Range    Hemoglobin 7.6 (L) 12.0 - 15.0 g/dL    Hematocrit 77.9 (L) 36.0 - 45.0 %   POC BLOOD GAS ARTERIAL    Collection Time: 09/07/23  8:06 PM   Result Value Ref Range    PH-ART-POC 7.41 7.35 - 7.45    PCO2-ART-POC 30 (L) 35 - 45 mm[Hg]    PO2-ART-POC 67 (L) 80 - 100 mm[Hg]    Base Def-ART-POC 6 mmol/L    O2 Sat-ART-POC 93 (L) 95 - 99 %    Bicarbonate-ART-POC 19 (L) 21 - 28 mmol/L   POC HEMATOCRIT&HEMOGLOBIN    Collection Time: 09/07/23  8:06 PM   Result Value Ref Range    Hemoglobin POC 7.8 (L) 12.5 - 15.0 g/dL    Hematocrit POC 23 (  L) 36 - 45 %   POC SODIUM    Collection Time: 09/07/23  8:06 PM   Result Value Ref Range    SODIUM-POC 137 137 - 147 mmol/L   POC POTASSIUM    Collection Time: 09/07/23  8:06 PM   Result Value Ref Range    Potassium-POC 3.4 (L) 3.5 - 5.1 mmol/L   POC LACTATE    Collection Time: 09/07/23  8:07 PM   Result Value Ref Range    LACTIC ACID POC 4.7 (HH) 0.5 - 2.0 mmol/L   POC GLUCOSE    Collection Time: 09/07/23  8:07 PM   Result Value Ref Range    Glucose, POC 14 (LL) 70 - 100 mg/dL   POC GLUCOSE    Collection Time: 09/07/23  8:11 PM   Result Value Ref Range    Glucose, POC 13 (LL) 70 - 100 mg/dL   CBC    Collection Time: 09/07/23  8:15 PM   Result Value Ref Range    White Blood Cells 24.50 (H) 4.50 - 11.00 10*3/uL    Red Blood Cells 2.21 (L) 4.00 - 5.00 10*6/uL    Hemoglobin 7.3 (L) 12.0 - 15.0 g/dL    Hematocrit 78.6 (L) 36.0 - 45.0 %    MCV 96.0 80.0 - 100.0 fL    MCH 32.8 26.0 - 34.0 pg    MCHC 34.2 32.0 - 36.0 g/dL    RDW 81.2 (H) 88.9 - 15.0 %    Platelet Count 92 (L) 150 - 400 10*3/uL    MPV 9.6 7.0 - 11.0 fL   PROTIME INR (PT)    Collection Time: 09/07/23  8:15 PM   Result Value Ref Range    Protime 21.8 (H) 9.9 - 14.2 Seconds    INR 1.9 (H) 0.9 - 1.2   PTT (APTT)    Collection Time: 09/07/23  8:15 PM   Result Value Ref Range    APTT 41.7 (H) 24.0 - 36.5 Seconds   COMPREHENSIVE METABOLIC PANEL    Collection Time: 09/07/23  8:15 PM   Result Value Ref Range    Sodium 138 137 - 147 mmol/L    Potassium 3.6 3.5 - 5.1 mmol/L    Chloride 104 98 - 110 mmol/L    Glucose 17 (LL) 70 - 100 mg/dL    Blood Urea Nitrogen 36 (H) 7 - 25 mg/dL    Creatinine 6.06 (H) 0.40 - 1.00 mg/dL    Calcium  7.2 (L) 8.5 - 10.6 mg/dL    Total Protein 5.4 (L) 6.0 - 8.0 g/dL    Total Bilirubin 84.5 (H) 0.2 - 1.3 mg/dL    Albumin  1.8 (L) 3.5 - 5.0 g/dL    Alk Phosphatase 819 (H) 25 - 110 U/L    AST 123 (H) 7 - 40 U/L    ALT 58 (H) 7 - 56 U/L CO2 16 (L) 21 - 30 mmol/L    Anion Gap 18 (H) 3 - 12    Glomerular Filtration Rate (GFR) 12 (L) >60 mL/min   MAGNESIUM     Collection Time: 09/07/23  8:15 PM   Result Value Ref Range    Magnesium  1.6 1.6 - 2.6 mg/dL   PHOSPHORUS    Collection Time: 09/07/23  8:15 PM   Result Value Ref Range    Phosphorus 3.7 2.0 - 4.5 mg/dL   MYOGLOBIN-CHEM    Collection Time: 09/07/23  8:15 PM   Result Value Ref Range    Myoglobin-Serum-Chemistry 56 0 - 65  ng/mL   FIBRINOGEN    Collection Time: 09/07/23  8:15 PM   Result Value Ref Range    Fibrinogen 187 (L) 200 - 400 mg/dL   HIGH SENSITIVITY TROPONIN I, RANDOM    Collection Time: 09/07/23  8:15 PM   Result Value Ref Range    hs Troponin I, Random 31 (H) <15 ng/L   PROCALCITONIN    Collection Time: 09/07/23  8:15 PM   Result Value Ref Range    Procalcitonin 32.90 ng/mL   POC GLUCOSE    Collection Time: 09/07/23  8:22 PM   Result Value Ref Range    Glucose, POC 191 (H) 70 - 100 mg/dL   POC GLUCOSE    Collection Time: 09/07/23  9:03 PM   Result Value Ref Range    Glucose, POC 96 70 - 100 mg/dL   POC GLUCOSE    Collection Time: 09/07/23  9:41 PM   Result Value Ref Range    Glucose, POC 88 70 - 100 mg/dL   POC GLUCOSE    Collection Time: 09/07/23 10:07 PM   Result Value Ref Range    Glucose, POC 81 70 - 100 mg/dL   BLOOD GASES, ARTERIAL    Collection Time: 09/07/23 10:29 PM   Result Value Ref Range    pH 7.34 (L) 7.35 - 7.45    pCO2-Arterial 30 (L) 35 - 45 mmHg    pO2-Arterial 106 (H) 80 - 100 mmHg    Base Deficit-Arterial 8.9 mmol/L    O2 SAT-Arterial 98.2 95.0 - 99.0 %    Bicarbonate-ART-Cal 17.2 (L) 21.0 - 28.0 mmol/L    FiO2 Value Arterial 21 %   LACTIC ACID (BG - RAPID LACTATE)    Collection Time: 09/07/23 10:29 PM   Result Value Ref Range    Lactic Acid,BG 6.9 (HH) 0.5 - 2.0 mmol/L   HIGH SENSITIVITY TROPONIN I, RANDOM    Collection Time: 09/07/23 10:29 PM   Result Value Ref Range    hs Troponin I, Random 33 (H) <15 ng/L   NT-PRO-BNP    Collection Time: 09/07/23 10:29 PM Result Value Ref Range    NT-Pro-BNP 7,516 (H) <125 pg/mL   CBC    Collection Time: 09/07/23 10:29 PM   Result Value Ref Range    White Blood Cells 28.40 (H) 4.50 - 11.00 10*3/uL    Red Blood Cells 2.04 (L) 4.00 - 5.00 10*6/uL    Hemoglobin 6.7 (L) 12.0 - 15.0 g/dL    Hematocrit 80.0 (L) 36.0 - 45.0 %    MCV 97.4 80.0 - 100.0 fL    MCH 32.8 26.0 - 34.0 pg    MCHC 33.7 32.0 - 36.0 g/dL    RDW 80.6 (H) 88.9 - 15.0 %    Platelet Count 88 (L) 150 - 400 10*3/uL    MPV 9.5 7.0 - 11.0 fL   TYPE & CROSSMATCH    Collection Time: 09/07/23 10:29 PM   Result Value Ref Range    ABO/RH(D) O POS     Antibody Screen NEG     Crossmatch Expires 09/10/2023,2359     Units Ordered 1     Electronic Crossmatch YES     Record Check FOUND    PREPARE RBC - SUNQUEST    Collection Time: 09/07/23 10:29 PM   Result Value Ref Range    Units Ordered 1     Crossmatch Expires 09/10/2023,2359     Record Check FOUND     ABO/RH(D) O POS  Antibody Screen NEG     Electronic Crossmatch YES     Unit Number [id number]     Blood Component Type RBC,ADSOL,LEUKO REDUCED,2ND CONT.     Unit Division 00     Status OF Unit ISSUED     ISSUE DATE TIME [id number]     PRODUCT CODE [id number]     BLOOD TYPE O POS     CODING STATUS 5100     BLOOD EXPIRATION DATE [id number]     Transfusion Status OK TO TRANSFUSE     Crossmatch Result COMPATIBLE,ELECTRONIC    MRSA BY PCR (NASAL)    Collection Time: 09/07/23 10:40 PM    Specimen: Nasal; Swab   Result Value Ref Range    MRSA Pneumonia Screen Not Detected Not Detected, Test Invalid   POC GLUCOSE    Collection Time: 09/07/23 11:12 PM   Result Value Ref Range    Glucose, POC 68 (L) 70 - 100 mg/dL   POC GLUCOSE    Collection Time: 09-19-23 12:40 AM   Result Value Ref Range    Glucose, POC 208 (H) 70 - 100 mg/dL   LACTIC ACID (BG - RAPID LACTATE)    Collection Time: 09-19-2023 12:44 AM   Result Value Ref Range    Lactic Acid,BG 7.8 (HH) 0.5 - 2.0 mmol/L   POC GLUCOSE    Collection Time: 09/19/23  1:53 AM   Result Value Ref Range    Glucose, POC 167 (H) 70 - 100 mg/dL   LACTIC ACID (BG - RAPID LACTATE)    Collection Time: 09-19-23  2:41 AM   Result Value Ref Range    Lactic Acid,BG 8.5 (HH) 0.5 - 2.0 mmol/L   HIGH SENSITIVITY TROPONIN I, RANDOM    Collection Time: Sep 19, 2023  2:41 AM   Result Value Ref Range    hs Troponin I, Random 28 (H) <15 ng/L   PROTIME INR (PT)    Collection Time: 2023/09/19  2:41 AM   Result Value Ref Range    Protime 24.1 (H) 9.9 - 14.2 Seconds    INR 2.2 (H) 0.9 - 1.2   COMPREHENSIVE METABOLIC PANEL    Collection Time: Sep 19, 2023  2:41 AM   Result Value Ref Range    Sodium 136 (L) 137 - 147 mmol/L    Potassium 3.9 3.5 - 5.1 mmol/L    Chloride 104 98 - 110 mmol/L    Glucose 157 (H) 70 - 100 mg/dL    Blood Urea Nitrogen 35 (H) 7 - 25 mg/dL    Creatinine 6.03 (H) 0.40 - 1.00 mg/dL    Calcium  7.1 (L) 8.5 - 10.6 mg/dL    Total Protein 5.4 (L) 6.0 - 8.0 g/dL    Total Bilirubin 82.6 (H) 0.2 - 1.3 mg/dL    Albumin  2.3 (L) 3.5 - 5.0 g/dL    Alk Phosphatase 861 (H) 25 - 110 U/L    AST 118 (H) 7 - 40 U/L    ALT 55 7 - 56 U/L    CO2 12 (L) 21 - 30 mmol/L    Anion Gap 20 (H) 3 - 12    Glomerular Filtration Rate (GFR) 12 (L) >60 mL/min   MAGNESIUM     Collection Time: 2023/09/19  2:41 AM   Result Value Ref Range    Magnesium  1.5 (L) 1.6 - 2.6 mg/dL   PHOSPHORUS    Collection Time: 09-19-2023  2:41 AM   Result Value Ref Range    Phosphorus 4.4 2.0 - 4.5 mg/dL  BLOOD GASES, ARTERIAL    Collection Time: 19-Sep-2023  2:41 AM   Result Value Ref Range    pH 7.29 (L) 7.35 - 7.45    pCO2-Arterial 30 (L) 35 - 45 mmHg    pO2-Arterial 93 80 - 100 mmHg    Base Deficit-Arterial 11.5 mmol/L    O2 SAT-Arterial 97.4 95.0 - 99.0 %    Bicarbonate-ART-Cal 15.1 (L) 21.0 - 28.0 mmol/L    FiO2 Value Arterial 21 %   CBC AND DIFF    Collection Time: 2023/09/19  4:59 AM   Result Value Ref Range    White Blood Cells 37.40 (H) 4.50 - 11.00 10*3/uL    Red Blood Cells 2.51 (L) 4.00 - 5.00 10*6/uL    Hemoglobin 8.2 (L) 12.0 - 15.0 g/dL    Hematocrit 75.8 (L) 36.0 - 45.0 %    MCV 96.1 80.0 - 100.0 fL    MCH 32.5 26.0 - 34.0 pg    MCHC 33.9 32.0 - 36.0 g/dL    RDW 81.3 (H) 88.9 - 15.0 %    Platelet Count 78 (L) 150 - 400 10*3/uL    MPV 10.6 7.0 - 11.0 fL    Neutrophils 81.4 (H) 41.0 - 77.0 %    Lymphocytes 1.6 (L) 24.0 - 44.0 %    Monocytes 16.5 (H) 4.0 - 12.0 %    Eosinophils 0.3 0.0 - 5.0 %    Basophils 0.2 0.0 - 2.0 %    Absolute Neutrophil Count 30.50 (H) 1.80 - 7.00 10*3/uL    Absolute Lymph Count 0.60 (L) 1.00 - 4.80 10*3/uL    Absolute Monocyte Count 6.20 (H) 0.00 - 0.80 10*3/uL    Absolute Eosinophil Count 0.10 0.00 - 0.45 10*3/uL    Absolute Basophil Count 0.10 0.00 - 0.20 10*3/uL    MDW (Monocyte Distribution Width) 51.0 (H) <=20.6   LACTIC ACID (BG - RAPID LACTATE)    Collection Time: 09/19/23  4:59 AM   Result Value Ref Range    Lactic Acid,BG 8.2 (HH) 0.5 - 2.0 mmol/L   BLOOD GASES, ARTERIAL    Collection Time: September 19, 2023  6:48 AM   Result Value Ref Range    pH 7.23 (L) 7.35 - 7.45    pCO2-Arterial 35 35 - 45 mmHg    pO2-Arterial 84 80 - 100 mmHg    Base Deficit-Arterial 12.2 mmol/L    O2 SAT-Arterial 95.5 95.0 - 99.0 %    Bicarbonate-ART-Cal 14.7 (L) 21.0 - 28.0 mmol/L    FiO2 Value Arterial 60 %   POC GLUCOSE    Collection Time: 2023/09/19  7:36 AM   Result Value Ref Range    Glucose, POC 144 (H) 70 - 100 mg/dL   CBC    Collection Time: 2023-09-19  7:37 AM   Result Value Ref Range    White Blood Cells 39.70 (H) 4.50 - 11.00 10*3/uL    Red Blood Cells 2.52 (L) 4.00 - 5.00 10*6/uL    Hemoglobin 8.3 (L) 12.0 - 15.0 g/dL    Hematocrit 75.6 (L) 36.0 - 45.0 %    MCV 96.5 80.0 - 100.0 fL    MCH 32.9 26.0 - 34.0 pg    MCHC 34.0 32.0 - 36.0 g/dL    RDW 80.9 (H) 88.9 - 15.0 %    Platelet Count 79 (L) 150 - 400 10*3/uL    MPV 10.8 7.0 - 11.0 fL   LACTIC ACID (BG - RAPID LACTATE)    Collection Time: 2023-09-19  7:37 AM  Result Value Ref Range    Lactic Acid,BG 8.1 (HH) 0.5 - 2.0 mmol/L   BASIC METABOLIC PANEL    Collection Time: 10-03-2023  7:37 AM   Result Value Ref Range    Sodium 136 (L) 137 - 147 mmol/L    Potassium 3.9 3.5 - 5.1 mmol/L    Chloride 101 98 - 110 mmol/L    Glucose 148 (H) 70 - 100 mg/dL    Blood Urea Nitrogen 33 (H) 7 - 25 mg/dL    Creatinine 5.98 (H) 0.40 - 1.00 mg/dL    Calcium  7.1 (L) 8.5 - 10.6 mg/dL    CO2 15 (L) 21 - 30 mmol/L    Anion Gap 20 (H) 3 - 12    Glomerular Filtration Rate (GFR) 12 (L) >60 mL/min   BLOOD GASES, ARTERIAL    Collection Time: October 03, 2023  7:37 AM   Result Value Ref Range    pH 7.22 (L) 7.35 - 7.45    pCO2-Arterial 36 35 - 45 mmHg    pO2-Arterial 72 (L) 80 - 100 mmHg    Base Deficit-Arterial 12.2 mmol/L    O2 SAT-Arterial 92.6 (L) 95.0 - 99.0 %    Bicarbonate-ART-Cal 14.7 (L) 21.0 - 28.0 mmol/L    FiO2 Value Arterial 60 %   URINALYSIS DIPSTICK REFLEX TO CULTURE    Collection Time: 2023-10-03  8:22 AM    Specimen: Midstream; Urine   Result Value Ref Range    Color,UA Amber     Turbidity,UA Clear Clear    Specific Gravity-Urine 1.016 1.005 - 1.030    pH,UA 6.0 5.0 - 8.0    Protein,UA 2+ (A) Negative    Glucose,UA Negative Negative    Ketones,UA Negative Negative    Bilirubin,UA Positive (A) Negative    Blood,UA Negative Negative    Urobilinogen,UA Increased (A) Normal    Nitrite,UA Negative Negative    Leukocytes,UA Negative Negative   URINALYSIS MICROSCOPIC REFLEX TO CULTURE    Collection Time: 10/03/23  8:22 AM    Specimen: Midstream; Urine   Result Value Ref Range    WBCs,UA 0 - 2 None, 0 - 2  /HPF    RBCs,UA 0 - 2 None, 0 - 2  /HPF    Mucous,UA Trace None, Trace /LPF    Squamous Epithelial Cells 2 - 5 None, 0 - 2 , 2 - 5 /HPF   LACTIC ACID (BG - RAPID LACTATE)    Collection Time: 10/03/23  9:19 AM   Result Value Ref Range    Lactic Acid,BG 8.2 (HH) 0.5 - 2.0 mmol/L   BLOOD GASES, ARTERIAL    Collection Time: 10/03/2023  9:19 AM   Result Value Ref Range    pH 7.22 (L) 7.35 - 7.45    pCO2-Arterial 36 35 - 45 mmHg    pO2-Arterial 75 (L) 80 - 100 mmHg    Base Deficit-Arterial 12.5 mmol/L    O2 SAT-Arterial 92.9 (L) 95.0 - 99.0 %    Bicarbonate-ART-Cal 14.5 (L) 21.0 - 28.0 mmol/L    FiO2 Value Arterial 60 %     Radiology: Reviewed.

## 2023-09-09 LAB — TYPE & CROSSMATCH
~~LOC~~ BKR ANTIBODY SCREEN: NEGATIVE
~~LOC~~ BKR UNITS ORDERED: 1

## 2023-09-09 LAB — PREPARE RBC - SUNQUEST
ANTIBODY SCREEN: NEGATIVE
BLOOD EXPIRATION DATE: 202
CODING STATUS: 510
ISSUE DATE TIME: 202
PRODUCT CODE: 0
UNIT DIVISION: 0
UNITS ORDERED: 1

## 2023-09-09 LAB — BLOOD CULTURE MOLECULAR DETECTION: ~~LOC~~ BKR KLEBSIELLA PNEUMONIAE GROUP: DETECTED — AB

## 2023-09-10 LAB — ECG 12-LEAD
Q-T INTERVAL: 390 ms
QRS DURATION: 144 ms
R AXIS: -47 degrees
T AXIS: -61 degrees
VENTRICULAR RATE: 129 {beats}/min

## 2023-09-11 LAB — FUNGITELL: ~~LOC~~ BKR FUNGITELL: 176 pg/mL — ABNORMAL HIGH (ref <80)

## 2023-09-11 LAB — ASPERGILLUS GALACTOMANNAN SERUM
~~LOC~~ BKR ASPERGILLUS GALACTOMANNAN INDEX: 0
~~LOC~~ BKR ASPERGILLUS GLACTOM: NEGATIVE

## 2023-09-13 LAB — CULTURE-BLOOD W/SENSITIVITY

## 2023-09-18 NOTE — Death Pronouncement
 Name: Lavon Horn   MRN: [id number]     DOB: [birth year]      Age: 68 y.o.  Admission Date: 08/20/2023     LOS: 19 days     Date of Service: Sep 09, 2023         This note communicates necessary documentation for date and time of patient's death and for physician completion of death certificate.     Date death confirmed: 2023-09-09  Time death confirmed: Sep 24, 1813     Assessment:  Patient noted by family/nurse to be not breathing/lacking vital signs.  Aashvi Rezabek, APRN-NP has done the following assessment and confirmed the patient to have no responsiveness, no heart tones detected after 60 seconds of auscultation, no apical pulse, no respirations, and absence of pupillary reflex.      Actions:  Service Attending Physician Dr. Jonne was notified and order placed for ?discharge as deceased?      Multiple family members, including patient's husband, sister and son were at bedside at the time of patient's death.      Autopsy Questions:  Does the family wish an autopsy to be performed? No     Comments: None     Attending physician responsible for completing/signing death certificate: Nazir

## 2023-09-18 NOTE — Deceased Discharge Summary
 Death Discharge Summary      Name: Vicki Buchanan  Medical Record Number: [id number]        Account Number:  [id number]  Date Of Birth:  06-02-1955                         Age:  68 y.o.   Admit date:  08/20/2023                     Discharge date:  10-03-2023      Discharge Attending:  Angelena  Discharge Summary Completed By: Oneil DELENA Port, APRN-NP    Service: Med ICU 4 - [id number]    Reason for hospitalization:  Hyperbilirubinemia [E80.6]    Primary Discharge Diagnosis:   Septic Shock    Hospital Diagnoses:  Hospital Problems        Active Problems    * (Principal) Cholangitis (CMS-HCC)    Peritoneal carcinomatosis (CMS-HCC)    Cancer of appendix (CMS-HCC)    Hyperbilirubinemia    Moderate malnutrition    Septic shock (CMS-HCC)    Acute metabolic encephalopathy    Lactic acidosis    Elevated INR (international normalized ratio)    High anion gap metabolic acidosis     Present on Admission:   Hyperbilirubinemia   Peritoneal carcinomatosis (CMS-HCC)   Cancer of appendix (CMS-HCC)   Cholangitis (CMS-HCC)        Significant Past Medical History        Acid reflux  Allergy  Bundle branch block  Cancer (CMS-HCC)  Cancer of appendix (CMS-HCC)  Cancer of colon (CMS-HCC)  Pneumonia      Comment:  01/01/23 on 2 abx  Sarcoma (CMS-HCC)    Allergies   Indomethacin  and Shrimp    Brief Hospital Course   The patient was admitted and the following issues were addressed during this hospitalization and a summary of events leading to death (with pertinent details including admission exam/imaging/labs):      Vicki Buchanan 68 y.o. 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      She was directly admitted from clinic on 08/20/23 for Hgb 6.9 & rising total bilirubin & leukocytosis. Underwent ERCP (08/21/23) with stent exchange . Oncology consulted, signed off w/ no plans for inpatient cancer treatment. Renal consulted but also signed off as her kidney function was near her baseline. IR consulted for L-biliary drain placement on 6/10 w/ subsequent upsize x3. Biliary fluid collected on 6/12 & 6/14 routine culture + coag-negative Staphylococcus, anaerobic culture + Veillonella parvula. ID was consulted. IR re-engaged on 6/20 for right sided biliary drain placement given persistent hyperbilirubinemia despite upsize of previous left drain placement. She was rapid responded 6/20 for AMS and hypotension attributed to septic shock.  She was given IVF without improvement in blood pressure despite being fluid responsive and was started on vasopressors.      On arrival to the MICU she was lethargic, withdrawing to pain and hypotensive requiring vasopressors. NiCOM w/ of LR and was not fluid responsive. She developed increased oxygen needs for which a POCUS was performed that was c/w pulmonary edema and was placed on NIPPV. Antibiotics broadened from Unasyn  to Zosyn  and Linezolid .      On 2023/10/03 in  MICU her respiratory failure and encephalopathy worsened. Her respiratory failure was attributed to volume overload and aggressive diuresis was attempted. Blood cultures drawn following the rapid response revealed Klebsiella pneumoniae bacteremia and Candida glabrata fungemia. Micafungin  was added to existing antimicrobial coverage. Palliative care was also consulted to assist with goals of care discussions given her terminal cancer, bacteremia, fungemia and developing multiorgan failure. Nephrology was also consulted but no dialysis was offered given the patient's declining status and multiorgan failure. As the patient's clinical status continued to worsen through the day the family ultimately decided to transition to CMO, as any further interventions may prolong the patient's life, but likely would not add any further quality of life.     Vicki Buchanan passed away peacefully at 1815 on 2023/09/25 with multiple family members at bedside.     Date Death Pronounced: 09/25/23  Time Death Pronounced: 6:15 PM    Consults, Procedures, Diagnostics, Micro, Pathology   Consults: General Surgery, GI, ID, Oncology, and Palliative Care  Surgical Procedures & Dates: None  Significant Diagnostic Studies, Micro and Procedures: noted in brief hospital course  Significant Pathology: none  Nutrition:  Malnutrition present  ICD-10 code E44: Acute illness/Moderate non-severe malnutrition  Mild loss of body fat, Mild loss of muscle mass, Energy Intake: Less than 75% of estimated energy requirement for greater than 7days            Loss of Subcutaneous Fat: Yes Mild Triceps  Muscle Wasting: Yes Mild Clavicle               Malnutrition Interventions: Encouraged small frequent meals, reviewed meal ideas, encouraged Ensure supplements                  Discharge Disposition, Condition   Patient Disposition: Morgue/Funeral Home      Condition at Discharge: expired    Signed:  Oneil DELENA Port, APRN-NP  09/09/2023      cc:  Primary Care Physician:  Venson Miller A       Referring physicians:  No ref. provider found   Additional provider(s):        Did we miss something? If additional records are needed, please fax a request on office letterhead to [phone]. Please include the patient's name, date of birth, fax number and type of information needed. Additional request can be made by email at [email or web address]. For general questions of information about electronic records sharing, call [phone].

## 2023-09-18 DEATH — deceased

## 2023-10-16 ENCOUNTER — Encounter: Admit: 2023-10-16 | Discharge: 2023-10-16 | Payer: MEDICARE
